# Patient Record
Sex: Female | Born: 1950 | Race: Black or African American | Hispanic: No | Marital: Single | Smoking: Former smoker
Health system: Southern US, Community
[De-identification: ages and names within clinical notes are randomized; demographics above are authoritative.]

## PROBLEM LIST (undated history)

## (undated) DIAGNOSIS — J189 Pneumonia, unspecified organism: Secondary | ICD-10-CM

## (undated) DIAGNOSIS — G8220 Paraplegia, unspecified: Secondary | ICD-10-CM

## (undated) DIAGNOSIS — F32A Depression, unspecified: Secondary | ICD-10-CM

## (undated) DIAGNOSIS — I2510 Atherosclerotic heart disease of native coronary artery without angina pectoris: Secondary | ICD-10-CM

## (undated) DIAGNOSIS — C50919 Malignant neoplasm of unspecified site of unspecified female breast: Secondary | ICD-10-CM

## (undated) DIAGNOSIS — C220 Liver cell carcinoma: Secondary | ICD-10-CM

## (undated) DIAGNOSIS — J9690 Respiratory failure, unspecified, unspecified whether with hypoxia or hypercapnia: Secondary | ICD-10-CM

## (undated) DIAGNOSIS — Z860100 Personal history of colon polyps, unspecified: Secondary | ICD-10-CM

## (undated) DIAGNOSIS — I959 Hypotension, unspecified: Secondary | ICD-10-CM

## (undated) DIAGNOSIS — N3281 Overactive bladder: Secondary | ICD-10-CM

## (undated) DIAGNOSIS — N319 Neuromuscular dysfunction of bladder, unspecified: Secondary | ICD-10-CM

## (undated) DIAGNOSIS — M069 Rheumatoid arthritis, unspecified: Secondary | ICD-10-CM

## (undated) DIAGNOSIS — I10 Essential (primary) hypertension: Secondary | ICD-10-CM

## (undated) DIAGNOSIS — R160 Hepatomegaly, not elsewhere classified: Secondary | ICD-10-CM

## (undated) DIAGNOSIS — F329 Major depressive disorder, single episode, unspecified: Secondary | ICD-10-CM

## (undated) DIAGNOSIS — D649 Anemia, unspecified: Secondary | ICD-10-CM

## (undated) DIAGNOSIS — R110 Nausea: Secondary | ICD-10-CM

## (undated) DIAGNOSIS — K3184 Gastroparesis: Secondary | ICD-10-CM

## (undated) DIAGNOSIS — N189 Chronic kidney disease, unspecified: Secondary | ICD-10-CM

## (undated) DIAGNOSIS — M623 Immobility syndrome (paraplegic): Secondary | ICD-10-CM

## (undated) DIAGNOSIS — I5021 Acute systolic (congestive) heart failure: Secondary | ICD-10-CM

## (undated) DIAGNOSIS — D509 Iron deficiency anemia, unspecified: Secondary | ICD-10-CM

## (undated) DIAGNOSIS — K219 Gastro-esophageal reflux disease without esophagitis: Secondary | ICD-10-CM

## (undated) DIAGNOSIS — Z8719 Personal history of other diseases of the digestive system: Secondary | ICD-10-CM

## (undated) DIAGNOSIS — J90 Pleural effusion, not elsewhere classified: Secondary | ICD-10-CM

## (undated) DIAGNOSIS — Z978 Presence of other specified devices: Secondary | ICD-10-CM

## (undated) DIAGNOSIS — K5900 Constipation, unspecified: Secondary | ICD-10-CM

## (undated) DIAGNOSIS — K449 Diaphragmatic hernia without obstruction or gangrene: Secondary | ICD-10-CM

## (undated) DIAGNOSIS — R609 Edema, unspecified: Secondary | ICD-10-CM

## (undated) DIAGNOSIS — D259 Leiomyoma of uterus, unspecified: Secondary | ICD-10-CM

## (undated) HISTORY — DX: Acute systolic (congestive) heart failure: I50.21

## (undated) HISTORY — DX: Gastro-esophageal reflux disease without esophagitis: K21.9

## (undated) HISTORY — DX: Personal history of colon polyps, unspecified: Z86.0100

## (undated) HISTORY — DX: Leiomyoma of uterus, unspecified: D25.9

## (undated) HISTORY — DX: Pneumonia, unspecified organism: J18.9

## (undated) HISTORY — DX: Edema, unspecified: R60.9

## (undated) HISTORY — DX: Paraplegia, unspecified: G82.20

## (undated) HISTORY — PX: BACK SURGERY: SHX140

## (undated) HISTORY — DX: Malignant neoplasm of unspecified site of unspecified female breast: C50.919

## (undated) HISTORY — DX: Depression, unspecified: F32.A

## (undated) HISTORY — DX: Hypotension, unspecified: I95.9

## (undated) HISTORY — DX: Rheumatoid arthritis, unspecified: M06.9

## (undated) HISTORY — DX: Diaphragmatic hernia without obstruction or gangrene: K44.9

## (undated) HISTORY — DX: Respiratory failure, unspecified, unspecified whether with hypoxia or hypercapnia: J96.90

## (undated) HISTORY — DX: Immobility syndrome (paraplegic): M62.3

## (undated) HISTORY — DX: Anemia, unspecified: D64.9

## (undated) HISTORY — DX: Essential (primary) hypertension: I10

## (undated) HISTORY — DX: Major depressive disorder, single episode, unspecified: F32.9

## (undated) HISTORY — DX: Chronic kidney disease, unspecified: N18.9

## (undated) HISTORY — DX: Personal history of colonic polyps: Z86.010

## (undated) HISTORY — DX: Iron deficiency anemia, unspecified: D50.9

## (undated) HISTORY — DX: Pleural effusion, not elsewhere classified: J90

## (undated) HISTORY — DX: Gastroparesis: K31.84

---

## 1975-07-28 DIAGNOSIS — M623 Immobility syndrome (paraplegic): Secondary | ICD-10-CM

## 1975-07-28 HISTORY — DX: Immobility syndrome (paraplegic): M62.3

## 2001-07-27 HISTORY — PX: BREAST SURGERY: SHX581

## 2007-07-28 HISTORY — PX: PRESSURE ULCER DEBRIDEMENT: SHX750

## 2008-09-01 ENCOUNTER — Inpatient Hospital Stay (HOSPITAL_COMMUNITY)
Admission: EM | Admit: 2008-09-01 | Discharge: 2008-09-04 | Payer: Self-pay | Source: Home / Self Care | Admitting: Emergency Medicine

## 2008-09-02 HISTORY — PX: WOUND DEBRIDEMENT: SHX247

## 2008-10-12 DIAGNOSIS — D649 Anemia, unspecified: Secondary | ICD-10-CM

## 2008-10-12 DIAGNOSIS — D509 Iron deficiency anemia, unspecified: Secondary | ICD-10-CM

## 2008-10-12 DIAGNOSIS — I10 Essential (primary) hypertension: Secondary | ICD-10-CM

## 2008-10-12 HISTORY — DX: Anemia, unspecified: D64.9

## 2008-10-12 HISTORY — DX: Iron deficiency anemia, unspecified: D50.9

## 2008-10-12 HISTORY — DX: Essential (primary) hypertension: I10

## 2009-01-17 DIAGNOSIS — M069 Rheumatoid arthritis, unspecified: Secondary | ICD-10-CM

## 2009-01-17 HISTORY — DX: Rheumatoid arthritis, unspecified: M06.9

## 2009-02-02 DIAGNOSIS — K219 Gastro-esophageal reflux disease without esophagitis: Secondary | ICD-10-CM

## 2009-02-02 HISTORY — DX: Gastro-esophageal reflux disease without esophagitis: K21.9

## 2009-02-09 ENCOUNTER — Emergency Department (HOSPITAL_COMMUNITY): Admission: EM | Admit: 2009-02-09 | Discharge: 2009-02-09 | Payer: Self-pay | Admitting: Emergency Medicine

## 2009-03-07 ENCOUNTER — Ambulatory Visit: Payer: Self-pay | Admitting: Gastroenterology

## 2009-04-25 ENCOUNTER — Ambulatory Visit: Payer: Self-pay | Admitting: Gastroenterology

## 2009-05-20 DIAGNOSIS — R609 Edema, unspecified: Secondary | ICD-10-CM

## 2009-05-20 DIAGNOSIS — I959 Hypotension, unspecified: Secondary | ICD-10-CM

## 2009-05-20 HISTORY — DX: Hypotension, unspecified: I95.9

## 2009-05-20 HISTORY — DX: Edema, unspecified: R60.9

## 2009-08-24 ENCOUNTER — Ambulatory Visit: Payer: Self-pay

## 2010-07-26 ENCOUNTER — Emergency Department (HOSPITAL_COMMUNITY)
Admission: EM | Admit: 2010-07-26 | Discharge: 2010-07-26 | Payer: Self-pay | Source: Home / Self Care | Admitting: Emergency Medicine

## 2010-08-25 ENCOUNTER — Inpatient Hospital Stay (HOSPITAL_COMMUNITY)
Admission: EM | Admit: 2010-08-25 | Discharge: 2010-08-27 | DRG: 377 | Disposition: A | Discharge status: Skilled Nursing Facility | Payer: PRIVATE HEALTH INSURANCE | Attending: Internal Medicine | Admitting: Internal Medicine

## 2010-08-25 DIAGNOSIS — D539 Nutritional anemia, unspecified: Secondary | ICD-10-CM | POA: Diagnosis present

## 2010-08-25 DIAGNOSIS — R0789 Other chest pain: Secondary | ICD-10-CM | POA: Diagnosis present

## 2010-08-25 DIAGNOSIS — K208 Other esophagitis without bleeding: Secondary | ICD-10-CM | POA: Diagnosis present

## 2010-08-25 DIAGNOSIS — G8220 Paraplegia, unspecified: Secondary | ICD-10-CM | POA: Diagnosis present

## 2010-08-25 DIAGNOSIS — L89109 Pressure ulcer of unspecified part of back, unspecified stage: Secondary | ICD-10-CM | POA: Diagnosis present

## 2010-08-25 DIAGNOSIS — I10 Essential (primary) hypertension: Secondary | ICD-10-CM | POA: Diagnosis present

## 2010-08-25 DIAGNOSIS — Z853 Personal history of malignant neoplasm of breast: Secondary | ICD-10-CM

## 2010-08-25 DIAGNOSIS — R7402 Elevation of levels of lactic acid dehydrogenase (LDH): Secondary | ICD-10-CM | POA: Diagnosis present

## 2010-08-25 DIAGNOSIS — R7401 Elevation of levels of liver transaminase levels: Secondary | ICD-10-CM | POA: Diagnosis present

## 2010-08-25 DIAGNOSIS — F329 Major depressive disorder, single episode, unspecified: Secondary | ICD-10-CM | POA: Diagnosis present

## 2010-08-25 DIAGNOSIS — L8994 Pressure ulcer of unspecified site, stage 4: Secondary | ICD-10-CM | POA: Diagnosis present

## 2010-08-25 DIAGNOSIS — K920 Hematemesis: Principal | ICD-10-CM | POA: Diagnosis present

## 2010-08-25 DIAGNOSIS — Z9010 Acquired absence of unspecified breast and nipple: Secondary | ICD-10-CM

## 2010-08-25 DIAGNOSIS — F3289 Other specified depressive episodes: Secondary | ICD-10-CM | POA: Diagnosis present

## 2010-08-25 DIAGNOSIS — IMO0002 Reserved for concepts with insufficient information to code with codable children: Secondary | ICD-10-CM

## 2010-08-25 LAB — URINALYSIS, ROUTINE W REFLEX MICROSCOPIC
Hgb urine dipstick: NEGATIVE
Protein, ur: NEGATIVE mg/dL
Urobilinogen, UA: 1 mg/dL (ref 0.0–1.0)

## 2010-08-25 LAB — COMPREHENSIVE METABOLIC PANEL
AST: 27 U/L (ref 0–37)
Albumin: 3.2 g/dL — ABNORMAL LOW (ref 3.5–5.2)
Albumin: 2.9 g/dL — ABNORMAL LOW (ref 3.5–5.2)
Alkaline Phosphatase: 75 U/L (ref 39–117)
BUN: 10 mg/dL (ref 6–23)
Calcium: 9.4 mg/dL (ref 8.4–10.5)
Chloride: 105 mEq/L (ref 96–112)
Chloride: 109 mEq/L (ref 96–112)
Creatinine, Ser: 0.66 mg/dL (ref 0.4–1.2)
Creatinine, Ser: 0.6 mg/dL (ref 0.4–1.2)
GFR calc Af Amer: >60 mL/min (ref >60)
Glucose, Bld: 110 mg/dL — ABNORMAL HIGH (ref 70–99)
Potassium: 3.7 mEq/L (ref 3.5–5.1)
Total Bilirubin: 0.4 mg/dL (ref 0.3–1.2)
Total Protein: 8.6 g/dL — ABNORMAL HIGH (ref 6.0–8.3)

## 2010-08-25 LAB — DIFFERENTIAL
Lymphocytes Relative: 17 % (ref 12–46)
Lymphs Abs: 1.8 10*3/uL (ref 0.7–4.0)
Monocytes Absolute: 0.6 10*3/uL (ref 0.1–1.0)
Monocytes Relative: 6 % (ref 3–12)
Neutro Abs: 7.7 10*3/uL (ref 1.7–7.7)

## 2010-08-25 LAB — CBC
HCT: 41.2 % (ref 36.0–46.0)
Hemoglobin: 13.6 g/dL (ref 12.0–15.0)
MCH: 24.6 pg — ABNORMAL LOW (ref 26.0–34.0)
MCH: 24.2 pg — ABNORMAL LOW (ref 26.0–34.0)
MCHC: 33 g/dL (ref 30.0–36.0)
Platelets: 239 10*3/uL (ref 150–400)
RBC: 5.53 MIL/uL — ABNORMAL HIGH (ref 3.87–5.11)
RBC: 4.87 MIL/uL (ref 3.87–5.11)
RDW: 15.7 % — ABNORMAL HIGH (ref 11.5–15.5)
WBC: 8.4 10*3/uL (ref 4.0–10.5)

## 2010-08-25 LAB — CK TOTAL AND CKMB (NOT AT ARMC)
CK, MB: 1.3 ng/mL (ref 0.3–4.0)
Relative Index: INVALID (ref 0.0–2.5)
Total CK: 47 U/L (ref 7–177)

## 2010-08-25 LAB — URINE MICROSCOPIC-ADD ON

## 2010-08-25 LAB — CARDIAC PANEL(CRET KIN+CKTOT+MB+TROPI)
CK, MB: 0.9 ng/mL (ref 0.3–4.0)
Relative Index: INVALID (ref 0.0–2.5)
Total CK: 41 U/L (ref 7–177)
Troponin I: 0.01 ng/mL (ref 0.00–0.06)
Troponin I: 0.01 ng/mL (ref 0.00–0.06)

## 2010-08-25 LAB — TYPE AND SCREEN: Antibody Screen: NEGATIVE

## 2010-08-25 LAB — MRSA PCR SCREENING: MRSA by PCR: NEGATIVE

## 2010-08-26 LAB — COMPREHENSIVE METABOLIC PANEL
Albumin: 2.8 g/dL — ABNORMAL LOW (ref 3.5–5.2)
Alkaline Phosphatase: 65 U/L (ref 39–117)
BUN: 9 mg/dL (ref 6–23)
Calcium: 8.6 mg/dL (ref 8.4–10.5)
Creatinine, Ser: 0.58 mg/dL (ref 0.4–1.2)
Glucose, Bld: 90 mg/dL (ref 70–99)
Potassium: 3.5 mEq/L (ref 3.5–5.1)
Total Protein: 7.1 g/dL (ref 6.0–8.3)

## 2010-08-26 LAB — CBC
HCT: 34 % — ABNORMAL LOW (ref 36.0–46.0)
MCH: 23.9 pg — ABNORMAL LOW (ref 26.0–34.0)
MCHC: 31.8 g/dL (ref 30.0–36.0)
MCV: 75.4 fL — ABNORMAL LOW (ref 78.0–100.0)
Platelets: 219 10*3/uL (ref 150–400)
RDW: 15.9 % — ABNORMAL HIGH (ref 11.5–15.5)
WBC: 5.8 10*3/uL (ref 4.0–10.5)

## 2010-08-27 LAB — CBC
Hemoglobin: 10.3 g/dL — ABNORMAL LOW (ref 12.0–15.0)
MCH: 23.7 pg — ABNORMAL LOW (ref 26.0–34.0)
MCV: 75.8 fL — ABNORMAL LOW (ref 78.0–100.0)
Platelets: 209 10*3/uL (ref 150–400)
RBC: 4.34 MIL/uL (ref 3.87–5.11)
WBC: 6.3 10*3/uL (ref 4.0–10.5)

## 2010-08-29 DIAGNOSIS — K449 Diaphragmatic hernia without obstruction or gangrene: Secondary | ICD-10-CM

## 2010-08-29 HISTORY — DX: Diaphragmatic hernia without obstruction or gangrene: K44.9
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
## 2010-09-30 ENCOUNTER — Emergency Department (HOSPITAL_COMMUNITY)
Admission: EM | Admit: 2010-09-30 | Discharge: 2010-09-30 | Disposition: A | Discharge status: Home / Self Care | Payer: PRIVATE HEALTH INSURANCE | Attending: Emergency Medicine | Admitting: Emergency Medicine

## 2010-09-30 DIAGNOSIS — R112 Nausea with vomiting, unspecified: Secondary | ICD-10-CM | POA: Insufficient documentation

## 2010-09-30 DIAGNOSIS — I129 Hypertensive chronic kidney disease with stage 1 through stage 4 chronic kidney disease, or unspecified chronic kidney disease: Secondary | ICD-10-CM | POA: Insufficient documentation

## 2010-09-30 DIAGNOSIS — I2510 Atherosclerotic heart disease of native coronary artery without angina pectoris: Secondary | ICD-10-CM | POA: Insufficient documentation

## 2010-09-30 DIAGNOSIS — R630 Anorexia: Secondary | ICD-10-CM | POA: Insufficient documentation

## 2010-09-30 DIAGNOSIS — R509 Fever, unspecified: Secondary | ICD-10-CM | POA: Insufficient documentation

## 2010-09-30 DIAGNOSIS — Z853 Personal history of malignant neoplasm of breast: Secondary | ICD-10-CM | POA: Insufficient documentation

## 2010-09-30 DIAGNOSIS — N189 Chronic kidney disease, unspecified: Secondary | ICD-10-CM | POA: Insufficient documentation

## 2010-09-30 DIAGNOSIS — N390 Urinary tract infection, site not specified: Secondary | ICD-10-CM | POA: Insufficient documentation

## 2010-09-30 DIAGNOSIS — K219 Gastro-esophageal reflux disease without esophagitis: Secondary | ICD-10-CM | POA: Insufficient documentation

## 2010-09-30 DIAGNOSIS — Z79899 Other long term (current) drug therapy: Secondary | ICD-10-CM | POA: Insufficient documentation

## 2010-09-30 DIAGNOSIS — F3289 Other specified depressive episodes: Secondary | ICD-10-CM | POA: Insufficient documentation

## 2010-09-30 DIAGNOSIS — I959 Hypotension, unspecified: Secondary | ICD-10-CM | POA: Insufficient documentation

## 2010-09-30 DIAGNOSIS — F329 Major depressive disorder, single episode, unspecified: Secondary | ICD-10-CM | POA: Insufficient documentation

## 2010-09-30 DIAGNOSIS — G8220 Paraplegia, unspecified: Secondary | ICD-10-CM | POA: Insufficient documentation

## 2010-09-30 DIAGNOSIS — R109 Unspecified abdominal pain: Secondary | ICD-10-CM | POA: Insufficient documentation

## 2010-09-30 LAB — URINALYSIS, ROUTINE W REFLEX MICROSCOPIC
Glucose, UA: NEGATIVE mg/dL
Ketones, ur: 15 mg/dL — AB
Nitrite: POSITIVE — AB
Protein, ur: 100 mg/dL — AB
Specific Gravity, Urine: 1.017 (ref 1.005–1.030)
Urobilinogen, UA: 1 mg/dL (ref 0.0–1.0)
pH: 5.5 (ref 5.0–8.0)

## 2010-09-30 LAB — OCCULT BLOOD, POC DEVICE: Fecal Occult Bld: NEGATIVE

## 2010-09-30 LAB — COMPREHENSIVE METABOLIC PANEL
Albumin: 2.9 g/dL — ABNORMAL LOW (ref 3.5–5.2)
Alkaline Phosphatase: 79 U/L (ref 39–117)
BUN: 21 mg/dL (ref 6–23)
CO2: 19 mEq/L (ref 19–32)
Chloride: 109 mEq/L (ref 96–112)
GFR calc non Af Amer: 51 mL/min — ABNORMAL LOW (ref >60)
Glucose, Bld: 89 mg/dL (ref 70–99)
Potassium: 3.3 mEq/L — ABNORMAL LOW (ref 3.5–5.1)
Total Bilirubin: 0.8 mg/dL (ref 0.3–1.2)

## 2010-09-30 LAB — URINE MICROSCOPIC-ADD ON

## 2010-09-30 LAB — CBC
HCT: 37.6 % (ref 36.0–46.0)
Hemoglobin: 12.3 g/dL (ref 12.0–15.0)
MCH: 23.5 pg — ABNORMAL LOW (ref 26.0–34.0)
MCHC: 32.7 g/dL (ref 30.0–36.0)
MCV: 71.8 fL — ABNORMAL LOW (ref 78.0–100.0)
Platelets: 299 K/uL (ref 150–400)
RBC: 5.24 MIL/uL — ABNORMAL HIGH (ref 3.87–5.11)
RDW: 16 % — ABNORMAL HIGH (ref 11.5–15.5)
WBC: 26.5 10*3/uL — ABNORMAL HIGH (ref 4.0–10.5)

## 2010-09-30 LAB — DIFFERENTIAL
Basophils Absolute: 0 K/uL (ref 0.0–0.1)
Basophils Relative: 0 % (ref 0–1)
Eosinophils Absolute: 0 K/uL (ref 0.0–0.7)
Eosinophils Relative: 0 % (ref 0–5)
Lymphocytes Relative: 6 % — ABNORMAL LOW (ref 12–46)
Lymphs Abs: 1.6 K/uL (ref 0.7–4.0)
Monocytes Absolute: 1.1 K/uL — ABNORMAL HIGH (ref 0.1–1.0)
Monocytes Relative: 4 % (ref 3–12)
Neutro Abs: 23.8 10*3/uL — ABNORMAL HIGH (ref 1.7–7.7)
Neutrophils Relative %: 90 % — ABNORMAL HIGH (ref 43–77)

## 2010-09-30 LAB — COMPREHENSIVE METABOLIC PANEL WITH GFR
ALT: 29 U/L (ref 0–35)
AST: 27 U/L (ref 0–37)
Calcium: 8.7 mg/dL (ref 8.4–10.5)
Creatinine, Ser: 1.1 mg/dL (ref 0.4–1.2)
GFR calc Af Amer: >60 mL/min (ref >60)
Sodium: 139 meq/L (ref 135–145)
Total Protein: 7.8 g/dL (ref 6.0–8.3)

## 2010-10-05 DIAGNOSIS — C50919 Malignant neoplasm of unspecified site of unspecified female breast: Secondary | ICD-10-CM

## 2010-10-05 DIAGNOSIS — K3184 Gastroparesis: Secondary | ICD-10-CM

## 2010-10-05 HISTORY — DX: Gastroparesis: K31.84

## 2010-10-05 HISTORY — DX: Malignant neoplasm of unspecified site of unspecified female breast: C50.919

## 2010-10-06 LAB — CBC
Hemoglobin: 12.7 g/dL (ref 12.0–15.0)
MCH: 24.7 pg — ABNORMAL LOW (ref 26.0–34.0)
MCV: 77.1 fL — ABNORMAL LOW (ref 78.0–100.0)
Platelets: 237 10*3/uL (ref 150–400)
RBC: 5.15 MIL/uL — ABNORMAL HIGH (ref 3.87–5.11)

## 2010-10-06 LAB — URINE CULTURE: Culture  Setup Time: 201112311108

## 2010-10-06 LAB — BASIC METABOLIC PANEL
CO2: 22 mEq/L (ref 19–32)
Chloride: 106 mEq/L (ref 96–112)
Creatinine, Ser: 0.69 mg/dL (ref 0.4–1.2)
GFR calc Af Amer: >60 mL/min (ref >60)

## 2010-10-06 LAB — URINE MICROSCOPIC-ADD ON

## 2010-10-06 LAB — URINALYSIS, ROUTINE W REFLEX MICROSCOPIC
Glucose, UA: NEGATIVE mg/dL
Ketones, ur: NEGATIVE mg/dL
Protein, ur: 30 mg/dL — AB

## 2010-10-06 LAB — DIFFERENTIAL
Eosinophils Absolute: 0 10*3/uL (ref 0.0–0.7)
Eosinophils Relative: 0 % (ref 0–5)
Lymphs Abs: 0.8 10*3/uL (ref 0.7–4.0)
Monocytes Relative: 2 % — ABNORMAL LOW (ref 3–12)
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
## 2010-11-11 LAB — URINE CULTURE: Colony Count: 85000

## 2010-11-11 LAB — POCT I-STAT, CHEM 8
BUN: 22 mg/dL (ref 6–23)
Calcium, Ion: 1.07 mmol/L — ABNORMAL LOW (ref 1.12–1.32)
Glucose, Bld: 124 mg/dL — ABNORMAL HIGH (ref 70–99)
TCO2: 26 mmol/L (ref 0–100)

## 2010-11-11 LAB — URINALYSIS, ROUTINE W REFLEX MICROSCOPIC
Bilirubin Urine: NEGATIVE
Ketones, ur: NEGATIVE mg/dL
Nitrite: POSITIVE — AB
Protein, ur: NEGATIVE mg/dL
Specific Gravity, Urine: 1.017 (ref 1.005–1.030)
Urobilinogen, UA: 1 mg/dL (ref 0.0–1.0)

## 2010-11-11 LAB — CBC
HCT: 40.1 % (ref 36.0–46.0)
Hemoglobin: 13.5 g/dL (ref 12.0–15.0)
MCHC: 33.7 g/dL (ref 30.0–36.0)
RDW: 16.6 % — ABNORMAL HIGH (ref 11.5–15.5)

## 2010-11-11 LAB — CULTURE, BLOOD (ROUTINE X 2): Culture: NO GROWTH

## 2010-11-11 LAB — CK TOTAL AND CKMB (NOT AT ARMC)
CK, MB: 0.8 ng/mL (ref 0.3–4.0)
Relative Index: INVALID (ref 0.0–2.5)

## 2010-11-11 LAB — DIFFERENTIAL
Basophils Absolute: 0 10*3/uL (ref 0.0–0.1)
Eosinophils Relative: 0 % (ref 0–5)
Lymphocytes Relative: 4 % — ABNORMAL LOW (ref 12–46)
Monocytes Absolute: 0.7 10*3/uL (ref 0.1–1.0)
Monocytes Relative: 5 % (ref 3–12)

## 2010-11-11 LAB — POCT CARDIAC MARKERS

## 2010-11-11 LAB — LEGIONELLA ANTIGEN, URINE

## 2010-11-11 LAB — BRAIN NATRIURETIC PEPTIDE: Pro B Natriuretic peptide (BNP): 82 pg/mL (ref 0.0–100.0)
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
## 2010-12-09 NOTE — Consult Note (Signed)
NAMETANA, TREFRY NO.:  [id number]   MEDICAL RECORD NO.:  [id number]          PATIENT TYPE:  INP   LOCATION:  2603                         FACILITY:  [facility]   PHYSICIAN:  Odis Hollingshead, M.D.DATE OF BIRTH:  Oct 13, 1950   DATE OF CONSULTATION:  DATE OF DISCHARGE:                                 CONSULTATION   REQUESTING PHYSICIAN:  Alcide Evener, MD   REASON FOR CONSULTATION:  Large sacral back open wound.   HISTORY:  This is a 60 year old female, T7 paraplegic and number of  other medical problems.  She started developing a sacral decubitus  ulcer, became infected and undermined up to her back and required  debridement.  She has been taken care with [facility].  She  now has a VAC on her back.  She presented to the emergency department  yesterday with subjective fevers, cough, and malaise and there was  concern for pneumonia.  However, during the workup for the fever and  possible source, the [facility] was removed.  Dr. Tommy Medal called me last night  wondering how to replace the [facility].  I had advised him just to go ahead  and put a wet-to-dry dressing change on and we could find a nurse to put  the [facility] on.  Last night, the nurse did come around and put the [facility] on  and currently is on.   PHYSICAL EXAMINATION:  On examination, she is a well-developed, well-  nourished female and she is in no acute distress.  Her back demonstrates  a VAC that was basically from the midscapular area all the way down to  the sacrum.  There was a good seal, good suction and is set at 125 mm.   IMPRESSION:  Large open wound of the back and sacral area.  VAC is now  on.   RECOMMENDATIONS:  I will have the wound care nurses see her tomorrow and  start VAC changes every other day.  They can also check on her heel  wound as well.      Odis Hollingshead, M.D.  Electronically Signed     TJR/MEDQ  D:  09/02/2008  T:  09/02/2008  Job:  [id number]   cc:   Alcide Evener, MD

## 2011-08-24 ENCOUNTER — Other Ambulatory Visit (HOSPITAL_BASED_OUTPATIENT_CLINIC_OR_DEPARTMENT_OTHER): Payer: Self-pay | Admitting: Internal Medicine

## 2011-08-24 DIAGNOSIS — N6320 Unspecified lump in the left breast, unspecified quadrant: Secondary | ICD-10-CM

## 2011-09-01 ENCOUNTER — Ambulatory Visit
Admission: RE | Admit: 2011-09-01 | Discharge: 2011-09-01 | Disposition: A | Discharge status: Home / Self Care | Payer: PRIVATE HEALTH INSURANCE | Source: Ambulatory Visit | Attending: Internal Medicine | Admitting: Internal Medicine

## 2011-09-01 ENCOUNTER — Other Ambulatory Visit (HOSPITAL_BASED_OUTPATIENT_CLINIC_OR_DEPARTMENT_OTHER): Payer: Self-pay | Admitting: Internal Medicine

## 2011-09-01 DIAGNOSIS — N6320 Unspecified lump in the left breast, unspecified quadrant: Secondary | ICD-10-CM

## 2011-09-01 LAB — HM MAMMOGRAPHY

## 2011-09-04 ENCOUNTER — Encounter (INDEPENDENT_AMBULATORY_CARE_PROVIDER_SITE_OTHER): Payer: Self-pay | Admitting: Surgery

## 2011-09-08 ENCOUNTER — Ambulatory Visit (INDEPENDENT_AMBULATORY_CARE_PROVIDER_SITE_OTHER): Payer: PRIVATE HEALTH INSURANCE | Admitting: Surgery

## 2011-09-08 ENCOUNTER — Encounter (INDEPENDENT_AMBULATORY_CARE_PROVIDER_SITE_OTHER): Payer: Self-pay | Admitting: Surgery

## 2011-09-08 VITALS — BP 122/84 | HR 64 | Temp 97.0°F | Resp 24 | Ht 65.0 in | Wt 160.0 lb

## 2011-09-08 DIAGNOSIS — C50912 Malignant neoplasm of unspecified site of left female breast: Secondary | ICD-10-CM

## 2011-09-08 DIAGNOSIS — C50919 Malignant neoplasm of unspecified site of unspecified female breast: Secondary | ICD-10-CM | POA: Insufficient documentation

## 2011-09-08 NOTE — Patient Instructions (Signed)
Call me at [phone] to let know your decision on surgery.
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
## 2011-09-15 ENCOUNTER — Telehealth: Payer: Self-pay | Admitting: *Deleted

## 2011-09-15 NOTE — Telephone Encounter (Signed)
Confirmed 09/25/11 appt w/ pt sister Pamala Hurry.  Unable to mail before appt letter & packet - gave verbal.

## 2011-09-24 ENCOUNTER — Other Ambulatory Visit: Payer: Self-pay | Admitting: *Deleted

## 2011-09-24 DIAGNOSIS — C50219 Malignant neoplasm of upper-inner quadrant of unspecified female breast: Secondary | ICD-10-CM

## 2011-09-25 ENCOUNTER — Other Ambulatory Visit: Payer: PRIVATE HEALTH INSURANCE | Admitting: Lab

## 2011-09-25 ENCOUNTER — Ambulatory Visit: Payer: PRIVATE HEALTH INSURANCE | Admitting: Oncology

## 2011-09-25 ENCOUNTER — Ambulatory Visit: Payer: PRIVATE HEALTH INSURANCE

## 2011-09-28 ENCOUNTER — Inpatient Hospital Stay (HOSPITAL_COMMUNITY): Admission: RE | Admit: 2011-09-28 | Payer: PRIVATE HEALTH INSURANCE | Source: Ambulatory Visit

## 2011-09-30 ENCOUNTER — Encounter (HOSPITAL_COMMUNITY): Admission: RE | Payer: Self-pay | Source: Ambulatory Visit

## 2011-09-30 ENCOUNTER — Ambulatory Visit (HOSPITAL_COMMUNITY): Admission: RE | Admit: 2011-09-30 | Payer: PRIVATE HEALTH INSURANCE | Source: Ambulatory Visit | Admitting: Surgery

## 2011-09-30 SURGERY — SIMPLE MASTECTOMY WITH AXILLARY SENTINEL NODE BIOPSY
Anesthesia: General | Laterality: Left

## 2011-10-01 DIAGNOSIS — K219 Gastro-esophageal reflux disease without esophagitis: Secondary | ICD-10-CM | POA: Insufficient documentation

## 2011-10-01 DIAGNOSIS — M359 Systemic involvement of connective tissue, unspecified: Secondary | ICD-10-CM | POA: Insufficient documentation

## 2011-10-01 DIAGNOSIS — G8220 Paraplegia, unspecified: Secondary | ICD-10-CM | POA: Insufficient documentation

## 2011-10-01 DIAGNOSIS — I10 Essential (primary) hypertension: Secondary | ICD-10-CM | POA: Insufficient documentation

## 2011-10-05 ENCOUNTER — Other Ambulatory Visit (INDEPENDENT_AMBULATORY_CARE_PROVIDER_SITE_OTHER): Payer: Self-pay | Admitting: Surgery

## 2011-11-30 ENCOUNTER — Encounter (INDEPENDENT_AMBULATORY_CARE_PROVIDER_SITE_OTHER): Payer: Self-pay

## 2013-01-06 ENCOUNTER — Other Ambulatory Visit: Payer: Self-pay | Admitting: *Deleted

## 2013-01-06 MED ORDER — HYDROCODONE-ACETAMINOPHEN 7.5-325 MG PO TABS: 1.0000 | ORAL_TABLET | ORAL | Status: DC | PRN

## 2013-02-23 ENCOUNTER — Encounter: Payer: Self-pay | Admitting: Internal Medicine

## 2013-02-23 ENCOUNTER — Non-Acute Institutional Stay (SKILLED_NURSING_FACILITY): Payer: PRIVATE HEALTH INSURANCE | Admitting: Internal Medicine

## 2013-02-23 DIAGNOSIS — J309 Allergic rhinitis, unspecified: Secondary | ICD-10-CM | POA: Insufficient documentation

## 2013-02-23 DIAGNOSIS — F32A Depression, unspecified: Secondary | ICD-10-CM

## 2013-02-23 DIAGNOSIS — G8220 Paraplegia, unspecified: Secondary | ICD-10-CM

## 2013-02-23 DIAGNOSIS — F329 Major depressive disorder, single episode, unspecified: Secondary | ICD-10-CM

## 2013-02-23 DIAGNOSIS — K5900 Constipation, unspecified: Secondary | ICD-10-CM

## 2013-02-23 DIAGNOSIS — M069 Rheumatoid arthritis, unspecified: Secondary | ICD-10-CM

## 2013-02-23 DIAGNOSIS — K219 Gastro-esophageal reflux disease without esophagitis: Secondary | ICD-10-CM

## 2013-02-23 DIAGNOSIS — I10 Essential (primary) hypertension: Secondary | ICD-10-CM

## 2013-02-23 DIAGNOSIS — F3289 Other specified depressive episodes: Secondary | ICD-10-CM

## 2013-02-23 DIAGNOSIS — N319 Neuromuscular dysfunction of bladder, unspecified: Secondary | ICD-10-CM

## 2013-02-23 NOTE — Progress Notes (Signed)
Patient ID: [name], female   DOB: 03-Dec-1950, 62 y.o.   MRN: [id number]   Code Status: full code  No Known Allergies  Chief Complaint  Patient presents with  . Medical Managment of Chronic Issues  . Rash   [facility] [place]  HPI:  62 y/o female patient seen today for routine visit. She has noticed a rash/ bump on her right chest for few days which has now opened and she wants this addressed. Denies any other complaints. She is OOB and moves around in Mining engineer wheelchair. No comlaints/ concerns from staff. Reviewed her medications and labs  Review of Systems  Constitutional: Negative for fever, chills and diaphoresis.  HENT: Negative for nosebleeds, congestion and sore throat.   Eyes: Negative for blurred vision.  Respiratory: Negative for cough, shortness of breath and wheezing.   Cardiovascular: Negative for chest pain and palpitations.  Gastrointestinal: Negative for heartburn, nausea, vomiting, abdominal pain, diarrhea and constipation.  Genitourinary: Negative for dysuria and frequency.  Musculoskeletal: Negative for myalgias and falls.  Skin: Negative for itching.  Neurological: Negative for dizziness, seizures and headaches.  Psychiatric/Behavioral: Negative for depression and memory loss. The patient does not have insomnia.      Past Medical History  Diagnosis Date  . Immobility syndrome (paraplegic)   . GERD (gastroesophageal reflux disease)   . Leiomyoma of uterus   . Anemia   . Hypertension   . Chronic kidney disease   . Cancer     rt breast  . CHF (congestive heart failure)    Past Surgical History  Procedure Laterality Date  . Breast surgery  2003    Right mastectomy  . Wound debridement  09/02/2008    Large sacral back open wound  . Pressure ulcer debridement  2009    on back   Social History:   reports that she quit smoking about 17 years ago. She does not have any smokeless tobacco history on file. She reports that she does not drink  alcohol or use illicit drugs.  Family History  Problem Relation Age of Onset  . Stroke Mother   . Hypertension Mother   . Cancer Maternal Aunt     unsure of what kind  . Hypertension Maternal Aunt   . Hypertension Maternal Uncle     Medications: Patient's Medications  New Prescriptions   No medications on file  Previous Medications   BACLOFEN (LIORESAL) 10 MG TABLET    Take 10 mg by mouth 3 (three) times daily.   BISACODYL (FLEET) 10 MG/30ML ENEM    Place 10 mg rectally once.   CALCIUM-VITAMIN D (OYSTER CALCIUM 500 + D) 500-200 MG-UNIT PER TABLET    Take 1 tablet by mouth daily.   CARVEDILOL (COREG) 6.25 MG TABLET    Take 6.25 mg by mouth 2 (two) times daily with a meal.   CETIRIZINE (ZYRTEC) 5 MG TABLET    Take 5 mg by mouth daily.   DIPHENHYDRAMINE (BENADRYL) 25 MG TABLET    Take 25 mg by mouth every 6 (six) hours as needed.   ESCITALOPRAM (LEXAPRO) 20 MG TABLET    Take 20 mg by mouth daily.   ESOMEPRAZOLE (NEXIUM) 40 MG CAPSULE    Take 40 mg by mouth daily before breakfast.   FUROSEMIDE (LASIX) 20 MG TABLET    Take 20 mg by mouth daily.   HYDROCODONE-ACETAMINOPHEN (NORCO) 7.5-325 MG PER TABLET    Take 1 tablet by mouth every 4 (four) hours as needed.  HYDROXYCHLOROQUINE (PLAQUENIL) 200 MG TABLET    Take by mouth daily.   MENTHOL, TOPICAL ANALGESIC, (BIOFREEZE EX)    Apply topically daily.   METOCLOPRAMIDE (REGLAN) 5 MG TABLET    Take 5 mg by mouth 3 (three) times daily.   MULTIPLE VITAMIN (MULTIVITAMIN) CAPSULE    Take 1 capsule by mouth daily.   OXYBUTYNIN (DITROPAN) 5 MG TABLET    Take 5 mg by mouth 3 (three) times daily.   PROMETHAZINE (PHENERGAN) 25 MG TABLET    Take 25 mg by mouth every 6 (six) hours as needed.   TRAZODONE (DESYREL) 50 MG TABLET    Take 75 mg by mouth at bedtime.    WOUND DRESSINGS (COMBIDERM ACD) PADS    Apply topically.  Modified Medications   No medications on file  Discontinued Medications   No medications on file     Physical Exam: Filed  Vitals:   02/23/13 1353  BP: 132/72  Pulse: 74  Temp: 97.4 F (36.3 C)  Resp: 18  Height: 5\' 5"  (1.651 m)  Weight: 166 lb (75.297 kg)  SpO2: 97%   gen- elderly female in NAD, in bed heent- no pallor, no icterus, no LAD, MMM Chest- has a small 0.5 cm open denuded skin area with erythema, non tender,no drainage on right mid chest area cvs- ns1,s2, rrr respi- CTAB abdo- bs+, soft, non tender Ext- able to move her upper extremities, has foot drop neuro- aaox 3, no focal deficit Psych- normal affect  Labs reviewed- 08/03/12 wbc 5.1, hb 9.1, hct 28.4, plt 297, na 136, k 3.7, glu 100, cr 0.58, lft wnl  Assessment/Plan  Paraplegia- continue baclofen for muscle spasm and norco for pain and monitor her symptoms. Continue to be OOB to chair  Constipation- continue bisacodyl, has bowel movement every 2 days  HTN- continue coreg and lasix, monitor bmp  Depression- her mood remains stable. Continue lexapro and trazodone  gerd- continue nexium for now and monitor clinically, continue metoclopramide   Neurogenic bladder- no desired effect noted with oxybutynin. Stop oxybutynin with tapering of it over 2 weeks.   Rash- Bacitracin ointment bid to the chest  RA- continue her hydroxychloroquine and monitor cbc  Allergic rhinitis- continue zyrtec for now  D/c benadryl and chloraseptic   Labs/tests ordered- cbc, cmp

## 2013-02-27 ENCOUNTER — Encounter: Payer: Self-pay | Admitting: *Deleted

## 2013-03-21 ENCOUNTER — Non-Acute Institutional Stay (SKILLED_NURSING_FACILITY): Payer: PRIVATE HEALTH INSURANCE | Admitting: Internal Medicine

## 2013-03-21 ENCOUNTER — Encounter: Payer: Self-pay | Admitting: Internal Medicine

## 2013-03-21 DIAGNOSIS — R61 Generalized hyperhidrosis: Secondary | ICD-10-CM

## 2013-03-21 DIAGNOSIS — L299 Pruritus, unspecified: Secondary | ICD-10-CM

## 2013-03-21 NOTE — Progress Notes (Signed)
Patient ID: [name], female   DOB: Dec 17, 1950, 62 y.o.   MRN: [id number] Location:  [facility] Provider:  Gwenith Spitz. Renato Gails, D.O., C.M.D.  Code Status:  Full code  Chief Complaint  Patient presents with  . Medical Managment of Chronic Issues    HPI:  62 yo female here for long term care.  She is paraplegic and has a h/o breast cancer s/p bilateral mastectomy, CAD, depression, uterine leiomyoma, HTN, CKD and anemia.  She recently has c/o diaphoresis of her lower body and feeling cold above her waist (wearing sweaters).    Review of Systems:  Review of Systems  Constitutional: Positive for diaphoresis. Negative for fever, chills, weight loss and malaise/fatigue.  Eyes: Negative for blurred vision.  Respiratory: Negative for cough and shortness of breath.   Cardiovascular: Negative for chest pain.  Gastrointestinal: Negative for abdominal pain.  Genitourinary: Negative for dysuria.       Neurogenic bladder  Musculoskeletal: Negative for falls and myalgias.  Skin: Positive for itching. Negative for rash.  Neurological: Negative for weakness and headaches.       Paraplegia  Endo/Heme/Allergies: Bruises/bleeds easily.  Psychiatric/Behavioral: Positive for depression. Negative for memory loss.    Medications: Patient's Medications  New Prescriptions   No medications on file  Previous Medications   BACLOFEN (LIORESAL) 10 MG TABLET    Take 10 mg by mouth 3 (three) times daily.   BISACODYL (FLEET) 10 MG/30ML ENEM    Place 10 mg rectally once.   CALCIUM-VITAMIN D (OYSTER CALCIUM 500 + D) 500-200 MG-UNIT PER TABLET    Take 1 tablet by mouth daily.   CARVEDILOL (COREG) 6.25 MG TABLET    Take 6.25 mg by mouth 2 (two) times daily with a meal.   CETIRIZINE (ZYRTEC) 5 MG TABLET    Take 5 mg by mouth daily.   DIPHENHYDRAMINE (BENADRYL) 25 MG TABLET    Take 25 mg by mouth every 6 (six) hours as needed.   ESCITALOPRAM (LEXAPRO) 20 MG TABLET    Take 20 mg by mouth daily.   ESOMEPRAZOLE (NEXIUM) 40 MG CAPSULE    Take 40 mg by mouth daily before breakfast.   FUROSEMIDE (LASIX) 20 MG TABLET    Take 20 mg by mouth daily.   HYDROCODONE-ACETAMINOPHEN (NORCO) 7.5-325 MG PER TABLET    Take 1 tablet by mouth every 4 (four) hours as needed.   HYDROXYCHLOROQUINE (PLAQUENIL) 200 MG TABLET    Take by mouth daily.   MENTHOL, TOPICAL ANALGESIC, (BIOFREEZE EX)    Apply topically daily.   METOCLOPRAMIDE (REGLAN) 5 MG TABLET    Take 5 mg by mouth 3 (three) times daily.   MULTIPLE VITAMIN (MULTIVITAMIN) CAPSULE    Take 1 capsule by mouth daily.   OXYBUTYNIN (DITROPAN) 5 MG TABLET    Take 5 mg by mouth 3 (three) times daily.   PROMETHAZINE (PHENERGAN) 25 MG TABLET    Take 25 mg by mouth every 6 (six) hours as needed.   TRAZODONE (DESYREL) 50 MG TABLET    Take 75 mg by mouth at bedtime.    WOUND DRESSINGS (COMBIDERM ACD) PADS    Apply topically.  Modified Medications   No medications on file  Discontinued Medications   No medications on file    Physical Exam: Filed Vitals:   03/21/13 1136  BP: 136/78  Pulse: 69  Temp: 97.5 F (36.4 C)  Resp: 18   Physical Exam  Constitutional: She is oriented to person, place, and  time. She appears well-developed and well-nourished. No distress.  All over her body, no other symptoms  Cardiovascular: Normal rate, regular rhythm, normal heart sounds and intact distal pulses.   Pulmonary/Chest: Effort normal and breath sounds normal. No respiratory distress.  Abdominal: Soft. Bowel sounds are normal. She exhibits no distension and no mass. There is no tenderness.  Neurological: She is alert and oriented to person, place, and time.  paraplegic  Skin: She is diaphoretic.  sweaty  Psychiatric: She has a normal mood and affect.   Labs reviewed: 02/24/13:  Wbc4.9,  hgb9.2/30.1, plts 255, Na 140, K 3.9, BUN 10, cr 0.6, alb 3.4, ALT 38, AST 26 03/11/13:  UA positive nitrite, negative LE, no bacteria  Assessment/Plan 1. Diaphoresis -etiology  unclear, just completed abx for a UTI -no evidence of infection or fever at this time  2. Pruritus -chronic and persistent, requested her benadryl be restarted for her chronic itching and this was done  3.  Chronic constipation -miralax x 3 days

## 2013-04-06 ENCOUNTER — Other Ambulatory Visit: Payer: Self-pay | Admitting: *Deleted

## 2013-04-06 MED ORDER — HYDROCODONE-ACETAMINOPHEN 7.5-325 MG PO TABS: ORAL_TABLET | ORAL | Status: DC

## 2013-04-19 ENCOUNTER — Non-Acute Institutional Stay (SKILLED_NURSING_FACILITY): Payer: PRIVATE HEALTH INSURANCE | Admitting: Adult Health

## 2013-04-19 DIAGNOSIS — G8220 Paraplegia, unspecified: Secondary | ICD-10-CM

## 2013-04-19 DIAGNOSIS — F329 Major depressive disorder, single episode, unspecified: Secondary | ICD-10-CM

## 2013-04-19 DIAGNOSIS — R609 Edema, unspecified: Secondary | ICD-10-CM

## 2013-04-19 DIAGNOSIS — F32A Depression, unspecified: Secondary | ICD-10-CM

## 2013-04-19 DIAGNOSIS — K219 Gastro-esophageal reflux disease without esophagitis: Secondary | ICD-10-CM

## 2013-04-19 DIAGNOSIS — M069 Rheumatoid arthritis, unspecified: Secondary | ICD-10-CM

## 2013-04-19 DIAGNOSIS — K5900 Constipation, unspecified: Secondary | ICD-10-CM

## 2013-04-19 DIAGNOSIS — F3289 Other specified depressive episodes: Secondary | ICD-10-CM

## 2013-04-19 DIAGNOSIS — I10 Essential (primary) hypertension: Secondary | ICD-10-CM

## 2013-04-19 DIAGNOSIS — N319 Neuromuscular dysfunction of bladder, unspecified: Secondary | ICD-10-CM

## 2013-05-01 ENCOUNTER — Encounter: Payer: Self-pay | Admitting: Adult Health

## 2013-05-01 DIAGNOSIS — R609 Edema, unspecified: Secondary | ICD-10-CM | POA: Insufficient documentation

## 2013-05-01 NOTE — Assessment & Plan Note (Signed)
She is presently not taking any medications; her foley has been removed; she states she has no control at all of her urine and that urine leaks out all the time. After a prolonged discussion she would like to see urology; will make referral.

## 2013-05-01 NOTE — Assessment & Plan Note (Signed)
Is stable will continue lasix 20 mg daily and will monitor

## 2013-05-01 NOTE — Assessment & Plan Note (Addendum)
She is stable will continue plaquenil 200 mg daily will continue biofreeze to left shoulder daily  Has vicodin 7.5/325 mg every 4 hours as needed for pain will continue ca++ with d twice daily

## 2013-05-01 NOTE — Assessment & Plan Note (Signed)
Is stable will continue dulcolax supp every other day and miralax daily as needed

## 2013-05-01 NOTE — Assessment & Plan Note (Signed)
She is doing well with nexium 40 mg daily and reglan 5 mg prior to meals. Will monitor

## 2013-05-01 NOTE — Assessment & Plan Note (Signed)
No change in status; has baclofen 10 mg three times daily as needed for spasticity will monitor

## 2013-05-01 NOTE — Assessment & Plan Note (Signed)
She is stable will continue coreg 6.25 mg twice daily and will monitor

## 2013-05-01 NOTE — Assessment & Plan Note (Addendum)
She is emotionally stable will continue lexapro 15 mg daily will continue trazodone 75 mg nightly for sleep and will monitor her

## 2013-05-01 NOTE — Progress Notes (Signed)
Patient ID: [name], female   DOB: Mar 11, 1951, 62 y.o.   MRN: [id number]  [facility]  No Known Allergies   Chief Complaint  Patient presents with  . Medical Managment of Chronic Issues    HPI: She is being treated for the management of her chronic illnesses. The nursing staff is not voicing any concerns at this time. She is telling me that she has had her foley removed this past spring and  No longer has any control of her urine and feels like it is constantly leaking out of her. She was treated for an uti in August of this year.     Past Medical History  Diagnosis Date  . Immobility syndrome (paraplegic)   . GERD (gastroesophageal reflux disease) 02/02/2009  . Leiomyoma of uterus   . Anemia 10/12/2008  . Hypertension 10/12/2008  . Chronic kidney disease   . Cancer     rt breast  . CHF (congestive heart failure)   . Malignant neoplasm of breast (female), unspecified site 10/05/2010  . Gastroparesis 10/05/2010  . Diaphragmatic hernia without mention of obstruction or gangrene 08/29/2010  . Hypotension, unspecified 05/20/2009  . Edema 05/20/2009  . Rheumatoid arthritis 01/17/2009  . Atherosclerosis of native arteries of the extremities, unspecified 11/06/2008  . Iron deficiency anemia, unspecified 10/12/2008    Past Surgical History  Procedure Laterality Date  . Breast surgery  2003    Right mastectomy  . Wound debridement  09/02/2008    Large sacral back open wound  . Pressure ulcer debridement  2009    on back    VITAL SIGNS BP 122/74  Pulse 66  Ht 5' 5"  (1.651 m)  Wt 171 lb (77.565 kg)  BMI 28.46 kg/m2   Patient's Medications  New Prescriptions   No medications on file  Previous Medications   BACLOFEN (LIORESAL) 10 MG TABLET    Take 10 mg by mouth 3 (three) times daily.   BISACODYL (DULCOLAX) 10 MG SUPPOSITORY    Place 10 mg rectally every other day.    CALCIUM-VITAMIN D (OYSTER CALCIUM 500 + D) 500-200 MG-UNIT PER TABLET    Take 1 tablet by  mouth 2 (two) times daily.    CARVEDILOL (COREG) 6.25 MG TABLET    Take 6.25 mg by mouth 2 (two) times daily with a meal.   ESCITALOPRAM (LEXAPRO) 20 MG TABLET    Take 15 mg by mouth daily.    ESOMEPRAZOLE (NEXIUM) 40 MG CAPSULE    Take 40 mg by mouth daily before breakfast.   FUROSEMIDE (LASIX) 20 MG TABLET    Take 20 mg by mouth daily.   HYDROCODONE-ACETAMINOPHEN (NORCO) 7.5-325 MG PER TABLET    Take one tablet by mouth every 4 hours as needed for pain   HYDROXYCHLOROQUINE (PLAQUENIL) 200 MG TABLET    Take by mouth daily.   HYDROXYZINE (ATARAX/VISTARIL) 25 MG TABLET    Take 25 mg by mouth every 8 (eight) hours as needed for itching.   MENTHOL, TOPICAL ANALGESIC, (BIOFREEZE EX)    Apply topically daily.   METOCLOPRAMIDE (REGLAN) 5 MG TABLET    Take 5 mg by mouth 3 (three) times daily.   MULTIPLE VITAMIN (MULTIVITAMIN) CAPSULE    Take 1 capsule by mouth daily.   POLYETHYLENE GLYCOL (MIRALAX / GLYCOLAX) PACKET    Take 17 g by mouth daily as needed.   TRAZODONE (DESYREL) 50 MG TABLET    Take 75 mg by mouth at bedtime.   Modified Medications  No medications on file  Discontinued Medications   CETIRIZINE (ZYRTEC) 5 MG TABLET    Take 5 mg by mouth daily.   OXYBUTYNIN (DITROPAN) 5 MG TABLET    Take 5 mg by mouth 3 (three) times daily.   PROMETHAZINE (PHENERGAN) 25 MG TABLET    Take 25 mg by mouth every 6 (six) hours as needed.   TAZAROTENE (TAZORAC) 0.05 % CREAM    Apply 1 application topically daily as needed.    SIGNIFICANT DIAGNOSTIC EXAMS    LABS REVIEWED:  02-24-13: wbc 4.9; hgb 9.2; hct 30.1; mcv 63.8; plt 255; glucose 104; bun 10; creat 0.60; k+3.9; na++140; liver normal albumin 3.4 03-13-13: urine culture: e-coli: septra ds.   Review of Systems  Constitutional: Negative for fever and malaise/fatigue.  Respiratory: Negative for cough and shortness of breath.   Cardiovascular: Negative for chest pain, palpitations and leg swelling.  Gastrointestinal: Negative for heartburn, abdominal  pain and constipation.  Genitourinary: Negative for dysuria.  Musculoskeletal: Negative for myalgias and joint pain.  Skin: Negative.   Neurological: Negative for headaches.  Psychiatric/Behavioral: Negative for depression. The patient does not have insomnia.     Physical Exam  Constitutional: She is oriented to person, place, and time. She appears well-developed and well-nourished.  Neck: Neck supple. No JVD present.  Cardiovascular: Normal rate, regular rhythm and intact distal pulses.   Respiratory: Effort normal and breath sounds normal. No respiratory distress. She has no wheezes.  GI: Soft. Bowel sounds are normal. She exhibits no distension. There is no tenderness.  Musculoskeletal: She exhibits no edema.  Has no movement in lower extremities; has full range of motion in upper extremities.   Neurological: She is alert and oriented to person, place, and time.  Skin: Skin is warm and dry.  Psychiatric: She has a normal mood and affect.       ASSESSMENT/ PLAN:  HTN (hypertension) She is stable will continue coreg 6.25 mg twice daily and will monitor   GERD (gastroesophageal reflux disease) She is doing well with nexium 40 mg daily and reglan 5 mg prior to meals. Will monitor   Unspecified constipation Is stable will continue dulcolax supp every other day and miralax daily as needed   Rheumatoid arthritis She is stable will continue plaquenil 200 mg daily will continue biofreeze to left shoulder daily  Has vicodin 7.5/325 mg every 4 hours as needed for pain will continue ca++ with d twice daily   Depression She is emotionally stable will continue lexapro 15 mg daily will continue trazodone 75 mg nightly for sleep and will monitor her   Neurogenic bladder She is presently not taking any medications; her foley has been removed; she states she has no control at all of her urine and that urine leaks out all the time. After a prolonged discussion she would like to see urology;  will make referral.   Paraplegia No change in status; has baclofen 10 mg three times daily as needed for spasticity will monitor   Edema Is stable will continue lasix 20 mg daily and will monitor    Time spent with patient 50 minutes.

## 2013-05-18 ENCOUNTER — Non-Acute Institutional Stay (SKILLED_NURSING_FACILITY): Payer: PRIVATE HEALTH INSURANCE | Admitting: Internal Medicine

## 2013-05-18 ENCOUNTER — Encounter: Payer: Self-pay | Admitting: Internal Medicine

## 2013-05-18 DIAGNOSIS — G8220 Paraplegia, unspecified: Secondary | ICD-10-CM

## 2013-05-18 DIAGNOSIS — K219 Gastro-esophageal reflux disease without esophagitis: Secondary | ICD-10-CM

## 2013-05-18 DIAGNOSIS — K5900 Constipation, unspecified: Secondary | ICD-10-CM

## 2013-05-18 DIAGNOSIS — I10 Essential (primary) hypertension: Secondary | ICD-10-CM

## 2013-05-18 DIAGNOSIS — M069 Rheumatoid arthritis, unspecified: Secondary | ICD-10-CM

## 2013-05-18 DIAGNOSIS — F329 Major depressive disorder, single episode, unspecified: Secondary | ICD-10-CM

## 2013-05-18 DIAGNOSIS — R609 Edema, unspecified: Secondary | ICD-10-CM

## 2013-05-18 DIAGNOSIS — F32A Depression, unspecified: Secondary | ICD-10-CM

## 2013-05-18 DIAGNOSIS — F3289 Other specified depressive episodes: Secondary | ICD-10-CM

## 2013-05-18 NOTE — Progress Notes (Signed)
Patient ID: [name], female   DOB: [birth year], 62 y.o.   MRN: [id number]  Chief Complaint  Patient presents with  . Medical Managment of Chronic Issues   No Known Allergies  [facility]  Code- full code  HPI: 62 y/o female patient is seen for routine follow up visit. She denies any complaint. She has been sleeping well. No new skin concerns. No behavioral concerns. No falls reported. No concerns from staff  Review of Systems  Constitutional: Negative for fever and malaise/fatigue.  Respiratory: Negative for cough and shortness of breath.   Cardiovascular: Negative for chest pain, palpitations and leg swelling.  Gastrointestinal: Negative for heartburn, abdominal pain and constipation.  Genitourinary: Negative for dysuria.  Musculoskeletal: Negative for myalgias and joint pain.  Skin: Negative.   Neurological: Negative for headaches.  Psychiatric/Behavioral: Negative for depression. The patient does not have insomnia.    Past Medical History  Diagnosis Date  . Immobility syndrome (paraplegic)   . GERD (gastroesophageal reflux disease) 02/02/2009  . Leiomyoma of uterus   . Anemia 10/12/2008  . Hypertension 10/12/2008  . Chronic kidney disease   . Cancer     rt breast  . CHF (congestive heart failure)   . Malignant neoplasm of breast (female), unspecified site 10/05/2010  . Gastroparesis 10/05/2010  . Diaphragmatic hernia without mention of obstruction or gangrene 08/29/2010  . Hypotension, unspecified 05/20/2009  . Edema 05/20/2009  . Rheumatoid arthritis 01/17/2009  . Atherosclerosis of native arteries of the extremities, unspecified 11/06/2008  . Iron deficiency anemia, unspecified 10/12/2008   Past Surgical History  Procedure Laterality Date  . Breast surgery  2003    Right mastectomy  . Wound debridement  09/02/2008    Large sacral back open wound  . Pressure ulcer debridement  2009    on back   Medication reviewed. See [facility]  Physical Exam  BP  112/68  Pulse 86  Temp(Src) 97.7 F (36.5 C)  Resp 18  Ht 5\' 5"  (1.651 m)  Wt 167 lb (75.751 kg)  BMI 27.79 kg/m2  SpO2 97%  Constitutional: She is oriented to person, place, and time. She appears well-developed and well-nourished.  Neck: Neck supple. No JVD present.  Cardiovascular: Normal rate, regular rhythm and intact distal pulses.   Respiratory: Effort normal and breath sounds normal. No respiratory distress. She has no wheezes.  GI: Soft. Bowel sounds are normal. She exhibits no distension. There is no tenderness.  Musculoskeletal: She exhibits no edema.  Has no movement in lower extremities, has full range of motion in upper extremities.   Neurological: She is alert and oriented to person, place, and time.  Skin: Skin is warm and dry.  Psychiatric: She has a normal mood and affect.   LABS REVIEWED:   02-24-13: wbc 4.9; hgb 9.2; hct 30.1; mcv 63.8; plt 255; glucose 104; bun 10; creat 0.60; k+3.9; na++140; liver normal albumin 3.4 03-13-13: urine culture: e-coli: septra ds.    ASSESSMENT/ PLAN:  HTN (hypertension) continue coreg 6.25 mg twice daily and will monitor   Paraplegia Stable currently. Will decrease baclofen to 10 mg bid and monitor   Depression emotionally stable and will continue lexapro 15 mg daily and trazodone 75 mg nightly   Rheumatoid arthritis No recent flare ups. Continue plaquenil 200 mg daily and biofreeze to left shoulder daily  Has vicodin 7.5/325 mg every 4 hours as needed for pain. continue ca++ with d twice daily   Edema Is stable and will continue lasix 20  mg daily and will monitor.check bmp periodically  GERD (gastroesophageal reflux disease) Continue nexium 40 mg daily and reglan 5 mg prior to meals. Will monitor for now  Unspecified constipation continue dulcolax supp every other day and miralax daily as needed

## 2013-06-02 ENCOUNTER — Non-Acute Institutional Stay (SKILLED_NURSING_FACILITY): Payer: PRIVATE HEALTH INSURANCE | Admitting: Internal Medicine

## 2013-06-02 DIAGNOSIS — A498 Other bacterial infections of unspecified site: Secondary | ICD-10-CM

## 2013-06-02 DIAGNOSIS — B9620 Unspecified Escherichia coli [E. coli] as the cause of diseases classified elsewhere: Secondary | ICD-10-CM

## 2013-06-02 DIAGNOSIS — N390 Urinary tract infection, site not specified: Secondary | ICD-10-CM

## 2013-06-02 NOTE — Progress Notes (Signed)
Patient ID: [name], female   DOB: 02/12/1951, 62 y.o.   MRN: [id number]  [name] living [place]  No Known Allergies  Chief Complaint  Patient presents with  . Acute Visit    urinary frequency   HPI 62 y/o female patient is seen in her room for urinary complaints. She has been having increased urinary frequency for a week and has noticed strong odor in her urine. Denies any dysuria but she has hx of neurogenic bladder. She also denies any fever or chills. No nausea or vomiting. Appetite is good  No other complaints  ROS No chest pain or dyspnea No bowel complaints No abdominal pain, nausea or vomiting Rest of ros negative. See hpi  Past Medical History  Diagnosis Date  . Immobility syndrome (paraplegic)   . GERD (gastroesophageal reflux disease) 02/02/2009  . Leiomyoma of uterus   . Anemia 10/12/2008  . Hypertension 10/12/2008  . Chronic kidney disease   . Cancer     rt breast  . CHF (congestive heart failure)   . Malignant neoplasm of breast (female), unspecified site 10/05/2010  . Gastroparesis 10/05/2010  . Diaphragmatic hernia without mention of obstruction or gangrene 08/29/2010  . Hypotension, unspecified 05/20/2009  . Edema 05/20/2009  . Rheumatoid arthritis 01/17/2009  . Atherosclerosis of native arteries of the extremities, unspecified 11/06/2008  . Iron deficiency anemia, unspecified 10/12/2008   Medication reviewed. See [facility]  Physical exam Vitals are stable, afebrile  Constitutional: She is oriented to person, place, and time. She appears well-developed and well-nourished.   Neck: Neck supple. No JVD present.   Cardiovascular: Normal rate, regular rhythm and intact distal pulses.    Respiratory: Effort normal and breath sounds normal. No respiratory distress. She has no wheezes.   GI: Soft. Bowel sounds are normal. She exhibits no distension. There is no tenderness.  Musculoskeletal: She exhibits no edema.  Has no movement in lower extremities,  has full range of motion in upper extremities.   Neurological: She is alert and oriented to person, place, and time.   Skin: Skin is warm and dry.  Psychiatric: She has a normal mood and affect.    Labs- 05/29/13 u/a has cloudy appearance and positive for LE and nitrite Culture has e.coli   Assessment/plan  uti- with e.coli uti, will start her on bactrim ds 1 tab q12h for a week. Will also have her on florastor 250 mg  bidfor 2 weeks. Encouraged hydration. Monitor for fever or chills

## 2013-07-14 ENCOUNTER — Non-Acute Institutional Stay (SKILLED_NURSING_FACILITY): Payer: PRIVATE HEALTH INSURANCE | Admitting: Internal Medicine

## 2013-07-14 DIAGNOSIS — K5900 Constipation, unspecified: Secondary | ICD-10-CM

## 2013-07-14 DIAGNOSIS — G4700 Insomnia, unspecified: Secondary | ICD-10-CM | POA: Insufficient documentation

## 2013-07-14 DIAGNOSIS — G8220 Paraplegia, unspecified: Secondary | ICD-10-CM

## 2013-07-14 DIAGNOSIS — R609 Edema, unspecified: Secondary | ICD-10-CM

## 2013-07-14 DIAGNOSIS — M069 Rheumatoid arthritis, unspecified: Secondary | ICD-10-CM

## 2013-07-14 DIAGNOSIS — F329 Major depressive disorder, single episode, unspecified: Secondary | ICD-10-CM

## 2013-07-14 DIAGNOSIS — I10 Essential (primary) hypertension: Secondary | ICD-10-CM

## 2013-07-14 DIAGNOSIS — K219 Gastro-esophageal reflux disease without esophagitis: Secondary | ICD-10-CM

## 2013-07-14 DIAGNOSIS — F32A Depression, unspecified: Secondary | ICD-10-CM

## 2013-07-14 DIAGNOSIS — F3289 Other specified depressive episodes: Secondary | ICD-10-CM

## 2013-07-14 NOTE — Progress Notes (Signed)
Patient ID: [name], female   DOB: 13-Oct-1950, 62 y.o.   MRN: [id number]    [name] living [employer]  Chief Complaint  Patient presents with  . Medical Managment of Chronic Issues   No Known Allergies  Code- full code  HPI: 62 y/o female patient is seen for routine follow up visit. She denies any complaint. No new skin concerns. No behavioral concerns. No falls reported. No concerns from staff  Review of Systems   Constitutional: Negative for fever and malaise/fatigue.   Respiratory: Negative for cough and shortness of breath.    Cardiovascular: Negative for chest pain, palpitations and leg swelling.   Gastrointestinal: Negative for heartburn, abdominal pain and constipation.   Genitourinary: Negative for dysuria.   Musculoskeletal: Negative for myalgias and joint pain.   Skin: Negative.    Neurological: Negative for headaches.   Psychiatric/Behavioral: Negative for depression. The patient does not have insomnia.    Past Medical History  Diagnosis Date  . Immobility syndrome (paraplegic)   . GERD (gastroesophageal reflux disease) 02/02/2009  . Leiomyoma of uterus   . Anemia 10/12/2008  . Hypertension 10/12/2008  . Chronic kidney disease   . Cancer     rt breast  . CHF (congestive heart failure)   . Malignant neoplasm of breast (female), unspecified site 10/05/2010  . Gastroparesis 10/05/2010  . Diaphragmatic hernia without mention of obstruction or gangrene 08/29/2010  . Hypotension, unspecified 05/20/2009  . Edema 05/20/2009  . Rheumatoid arthritis 01/17/2009  . Atherosclerosis of native arteries of the extremities, unspecified 11/06/2008  . Iron deficiency anemia, unspecified 10/12/2008   Medication reviewed. See MAR  BP 132/68  Pulse 68  Temp(Src) 98 F (36.7 C)  Resp 16  SpO2 96%  Constitutional: She is oriented to person, place, and time. She appears well-developed and well-nourished.   Neck: Neck supple. No JVD present.   Cardiovascular: Normal  rate, regular rhythm and intact distal pulses.    Respiratory: Effort normal and breath sounds normal. No respiratory distress. She has no wheezes.   GI: Soft. Bowel sounds are normal. She exhibits no distension. There is no tenderness.  Musculoskeletal: She exhibits no edema.  Has no movement in lower extremities, has full range of motion in upper extremities.   Neurological: She is alert and oriented to person, place, and time.   Skin: Skin is warm and dry.  Psychiatric: She has a normal mood and affect.   LABS REVIEWED:   02-24-13: wbc 4.9; hgb 9.2; hct 30.1; mcv 63.8; plt 255; glucose 104; bun 10; creat 0.60; k+3.9; na++140; liver normal albumin 3.4 03-13-13: urine culture: e-coli: septra ds.    ASSESSMENT/ PLAN:  HTN  continue coreg 6.25 mg twice daily and lasix and will monitor   Edema Is stable and will continue lasix 20 mg daily and will monitor.check bmp periodically  Paraplegia Stable currently. continue baclofen to 10 mg bid and monitor   Depression emotionally stable and will continue lexapro 15 mg daily  Insomnia continue trazodone 75 mg nightly   GERD Continue nexium 40 mg daily and reglan 5 mg prior to meals. Will monitor for now  Rheumatoid arthritis No recent flare ups. Continue plaquenil 200 mg daily and biofreeze to left shoulder daily  Has vicodin 7.5/325 mg every 4 hours as needed for pain. continue ca++ with d twice daily   Unspecified constipation continue dulcolax supp every other day and miralax daily as needed

## 2013-08-21 ENCOUNTER — Non-Acute Institutional Stay (SKILLED_NURSING_FACILITY): Payer: PRIVATE HEALTH INSURANCE | Admitting: Internal Medicine

## 2013-08-21 ENCOUNTER — Encounter: Payer: Self-pay | Admitting: Internal Medicine

## 2013-08-21 DIAGNOSIS — M069 Rheumatoid arthritis, unspecified: Secondary | ICD-10-CM

## 2013-08-21 DIAGNOSIS — G4700 Insomnia, unspecified: Secondary | ICD-10-CM

## 2013-08-21 DIAGNOSIS — K219 Gastro-esophageal reflux disease without esophagitis: Secondary | ICD-10-CM

## 2013-08-21 DIAGNOSIS — G8220 Paraplegia, unspecified: Secondary | ICD-10-CM

## 2013-08-21 DIAGNOSIS — F329 Major depressive disorder, single episode, unspecified: Secondary | ICD-10-CM

## 2013-08-21 DIAGNOSIS — F3289 Other specified depressive episodes: Secondary | ICD-10-CM

## 2013-08-21 DIAGNOSIS — I10 Essential (primary) hypertension: Secondary | ICD-10-CM

## 2013-08-21 DIAGNOSIS — F32A Depression, unspecified: Secondary | ICD-10-CM

## 2013-08-21 DIAGNOSIS — N390 Urinary tract infection, site not specified: Secondary | ICD-10-CM

## 2013-08-21 NOTE — Progress Notes (Signed)
Patient ID: [name], female   DOB: [birth year], 63 y.o.   MRN: [id number]    [name] living [employer]  Chief Complaint  Patient presents with  . Acute Visit    urinry complaints  . Medical Managment of Chronic Issues   No Known Allergies  Code- full code  HPI 63 y/o female patient is seen for routine follow up visit. She has been having strong odor to her urine and increased frequency. She has hx of neurogenic bladder. Her urine culture grew e.coli and she was started on cefexime yesterday. Pt is afebrile, alert and oriented. She would like to know if she needs long term antibiotic. No other concerns. Appetite is good. No new skin concerns. No behavioral concerns. No falls reported. No concerns from staff  Review of Systems   Constitutional: Negative for fever and malaise/fatigue.   Respiratory: Negative for cough and shortness of breath.    Cardiovascular: Negative for chest pain, palpitations and leg swelling.   Gastrointestinal: Negative for heartburn, abdominal pain and constipation.   Genitourinary: has neurogenic bladder Musculoskeletal: Negative for myalgias and joint pain.   Skin: Negative.    Neurological: Negative for headaches.   Psychiatric/Behavioral: Negative for depression. The patient does not have insomnia.    Past Medical History  Diagnosis Date  . Immobility syndrome (paraplegic)   . GERD (gastroesophageal reflux disease) 02/02/2009  . Leiomyoma of uterus   . Anemia 10/12/2008  . Hypertension 10/12/2008  . Chronic kidney disease   . Cancer     rt breast  . CHF (congestive heart failure)   . Malignant neoplasm of breast (female), unspecified site 10/05/2010  . Gastroparesis 10/05/2010  . Diaphragmatic hernia without mention of obstruction or gangrene 08/29/2010  . Hypotension, unspecified 05/20/2009  . Edema 05/20/2009  . Rheumatoid arthritis 01/17/2009  . Atherosclerosis of native arteries of the extremities, unspecified 11/06/2008  . Iron  deficiency anemia, unspecified 10/12/2008   Medication reviewed. See [facility]  Physical exam BP 140/78  Pulse 88  Temp(Src) 97.5 F (36.4 C)  Resp 18  Constitutional: She is oriented to person, place, and time. She appears well-developed and well-nourished.   Neck: Neck supple. No JVD present.   Cardiovascular: Normal rate, regular rhythm and intact distal pulses.    Respiratory: Effort normal and breath sounds normal. No respiratory distress. She has no wheezes.   GI: Soft. Bowel sounds are normal. She exhibits no distension. There is some suprapubic tenderness.  Musculoskeletal: She exhibits no edema.  Has no movement in lower extremities, has full range of motion in upper extremities.   Neurological: She is alert and oriented to person, place, and time.   Skin: Skin is warm and dry.  Psychiatric: She has a normal mood and affect.   LABS REVIEWED:   02-24-13: wbc 4.9; hgb 9.2; hct 30.1; mcv 63.8; plt 255; glucose 104; bun 10; creat 0.60; k+3.9; na++140; liver normal albumin 3.4 03-13-13: urine culture: e-coli: septra ds.  08-19-13 urine culture e.coli  ASSESSMENT/ PLAN:  UTI E.coli uti and has had recurrent e.coli growing. i have concerns for colonization. Will complete course of suprax for a week with florastor. Explained to pt that studies have not shown benefit with prophylactic antibiotics and will have her on cranberry capsule instead for now and monitor clinically. Pt voices understanding this. Encouraged hydration  Insomnia Continue trazodone at bedtime and monitor  Rheumatoid arthritis No recent flare ups. Continue plaquenil 200 mg daily and biofreeze to left shoulder daily  Has vicodin  7.5/325 mg every 4 hours as needed for pain. continue calcium with d twice daily . Check cbc and vitamin d level  HTN  continue coreg 6.25 mg twice daily and lasix 20 mg daily and will monitor her bp readings. Check bmp  Depression continue lexapro 15 mg daily and trazodone 75 mg nightly.  Mood is stable at present  Paraplegia Stable currently. Tolerating baclofen 10 mg bid and monitor   GERD  Continue nexium 40 mg daily and reglan 5 mg prior to meals. Will monitor for now  Labs- cbc, cmp, vit d

## 2013-10-04 ENCOUNTER — Other Ambulatory Visit: Payer: Self-pay | Admitting: *Deleted

## 2013-10-04 MED ORDER — HYDROCODONE-ACETAMINOPHEN 7.5-325 MG PO TABS: ORAL_TABLET | ORAL | Status: DC

## 2013-10-04 NOTE — Telephone Encounter (Signed)
[place]

## 2013-10-20 ENCOUNTER — Encounter: Payer: Self-pay | Admitting: Internal Medicine

## 2013-10-20 ENCOUNTER — Non-Acute Institutional Stay (SKILLED_NURSING_FACILITY): Payer: PRIVATE HEALTH INSURANCE | Admitting: Internal Medicine

## 2013-10-20 DIAGNOSIS — L299 Pruritus, unspecified: Secondary | ICD-10-CM

## 2013-10-20 DIAGNOSIS — G8220 Paraplegia, unspecified: Secondary | ICD-10-CM

## 2013-10-20 DIAGNOSIS — K219 Gastro-esophageal reflux disease without esophagitis: Secondary | ICD-10-CM

## 2013-10-20 DIAGNOSIS — K5900 Constipation, unspecified: Secondary | ICD-10-CM

## 2013-10-20 DIAGNOSIS — J069 Acute upper respiratory infection, unspecified: Secondary | ICD-10-CM

## 2013-10-20 DIAGNOSIS — F3289 Other specified depressive episodes: Secondary | ICD-10-CM

## 2013-10-20 DIAGNOSIS — I10 Essential (primary) hypertension: Secondary | ICD-10-CM

## 2013-10-20 DIAGNOSIS — F32A Depression, unspecified: Secondary | ICD-10-CM

## 2013-10-20 DIAGNOSIS — M069 Rheumatoid arthritis, unspecified: Secondary | ICD-10-CM

## 2013-10-20 DIAGNOSIS — F329 Major depressive disorder, single episode, unspecified: Secondary | ICD-10-CM

## 2013-10-20 NOTE — Progress Notes (Signed)
Patient ID: [name], female   DOB: [birth year], 63 y.o.   MRN: [id number]    [name] living [facility] [employer]  Chief Complaint  Patient presents with  . Medical Managment of Chronic Issues    cough, congestion, routine visit   No Known Allergies  Code- full code  HPI 63 y/o female patient is seen for routine follow up visit. She has been having cough and congestion for few days. She has been on zyrtec without any help. No fever or chills but her appetite has decreased. Cough is productive with yellow phlegm. She feels stuffed in her nose. Her constipation has worsened and current medication is not helping her. No other complaints. Leg pain is under control.   Review of Systems   Constitutional: Negative for fever and malaise/fatigue.   Respiratory: Negative for shortness of breath.    Cardiovascular: Negative for chest pain, palpitations and leg swelling.   Gastrointestinal: Negative for heartburn, abdominal pain   Genitourinary: has neurogenic bladder Musculoskeletal: Negative for myalgias and joint pain.   Skin: Negative.    Neurological: Negative for headaches.   Psychiatric/Behavioral: Negative for depression. The patient does not have insomnia  Past Medical History  Diagnosis Date  . Immobility syndrome (paraplegic)   . GERD (gastroesophageal reflux disease) 02/02/2009  . Leiomyoma of uterus   . Anemia 10/12/2008  . Hypertension 10/12/2008  . Chronic kidney disease   . Cancer     rt breast  . CHF (congestive heart failure)   . Malignant neoplasm of breast (female), unspecified site 10/05/2010  . Gastroparesis 10/05/2010  . Diaphragmatic hernia without mention of obstruction or gangrene 08/29/2010  . Hypotension, unspecified 05/20/2009  . Edema 05/20/2009  . Rheumatoid arthritis 01/17/2009  . Atherosclerosis of native arteries of the extremities, unspecified 11/06/2008  . Iron deficiency anemia, unspecified 10/12/2008   Current Outpatient Prescriptions on File Prior  to Visit  Medication Sig Dispense Refill  . baclofen (LIORESAL) 10 MG tablet Take 10 mg by mouth 2 (two) times daily.       . bisacodyl (DULCOLAX) 10 MG suppository Place 10 mg rectally every other day.       . calcium-vitamin D (OYSTER CALCIUM 500 + D) 500-200 MG-UNIT per tablet Take 1 tablet by mouth 2 (two) times daily.       . carvedilol (COREG) 6.25 MG tablet Take 6.25 mg by mouth 2 (two) times daily with a meal.      . escitalopram (LEXAPRO) 20 MG tablet Take 15 mg by mouth daily.       Marland Kitchen esomeprazole (NEXIUM) 40 MG capsule Take 40 mg by mouth daily before breakfast.      . furosemide (LASIX) 20 MG tablet Take 20 mg by mouth daily.      Marland Kitchen HYDROcodone-acetaminophen (NORCO) 7.5-325 MG per tablet Take one tablet by mouth every 4 hours as needed for pain  180 tablet  0  . hydroxychloroquine (PLAQUENIL) 200 MG tablet Take by mouth daily.      . hydrOXYzine (ATARAX/VISTARIL) 25 MG tablet Take 25 mg by mouth every 8 (eight) hours as needed for itching.      . Menthol, Topical Analgesic, (BIOFREEZE EX) Apply topically daily.      . metoCLOPramide (REGLAN) 5 MG tablet Take 5 mg by mouth 3 (three) times daily.      . Multiple Vitamin (MULTIVITAMIN) capsule Take 1 capsule by mouth daily.      . polyethylene glycol (MIRALAX / GLYCOLAX) packet Take 17 g by mouth  daily as needed.      . traZODone (DESYREL) 50 MG tablet Take 75 mg by mouth at bedtime.        No current facility-administered medications on file prior to visit.   Past Surgical History  Procedure Laterality Date  . Breast surgery  2003    Right mastectomy  . Wound debridement  09/02/2008    Large sacral back open wound  . Pressure ulcer debridement  2009    on back   Physical exam BP 122/62  Pulse 62  Temp(Src) 97.7 F (36.5 C)  Resp 18  Ht 5\' 5"  (1.651 m)  Wt 168 lb (76.204 kg)  BMI 27.96 kg/m2  SpO2 96%  Constitutional: She is oriented to person, place, and time. She appears well-developed and well-nourished. Nose:  swollen turbinates, red mucosa, clear nasal discharge, no maxillary or frontal sinus tenderness Throat: mild oropharynx erythema, no exudates   Neck: Neck supple. No JVD present. No cervical adneopathy Cardiovascular: Normal rate, regular rhythm and intact distal pulses.    Respiratory: Effort normal and breath sounds normal. No respiratory distress. She has no wheezes.   GI: Soft. Bowel sounds are normal. She exhibits no distension. There is some suprapubic tenderness.  Musculoskeletal: She exhibits no edema. Has no movement in lower extremities, has full range of motion in upper extremities.   Neurological: She is alert and oriented to person, place, and time.   Skin: Skin is warm and dry.  Psychiatric: She has a normal mood and affect.   LABS REVIEWED:   02-24-13: wbc 4.9; hgb 9.2; hct 30.1; mcv 63.8; plt 255; glucose 104; bun 10; creat 0.60; k+3.9; na++140; liver normal albumin 3.4 03-13-13: urine culture: e-coli: septra ds.   08-19-13 urine culture e.coli 08-23-13 wbc 5.7, hb 9.5, hct 30.2, plt 259, na 139, k 3.5, bun 7, cr 0.68, glu 98, ca 8.9  ASSESSMENT/ PLAN:  URI Will start her on mucinex 10 cc q8h prn for cough and start her on 5 days course of z pack. Reassess if no improvement. Discontinue zyrtec  constipation Mostly bed bound, also on opioids for pain. Will d/c her prior miralax, bisacodyl for now and have her on linzess 145 mg daily  Paraplegia Stable currently. Tolerating baclofen 10 mg bid and monitor   Pruritis Prn benadryl has been helpful, continue this and monitor  Insomnia Continue trazodone at bedtime and monitor  HTN   continue coreg 6.25 mg twice daily and lasix 20 mg daily and will monitor her bp readings.   Depression continue lexapro 15 mg daily and trazodone 75 mg nightly. Mood is stable at present  Rheumatoid arthritis No recent flare ups. Continue plaquenil 200 mg daily and biofreeze to left shoulder daily  Has vicodin 7.5/325 mg every 4 hours as  needed for pain. continue calcium with d twice daily . Check cbc and vitamin d level  GERD   Continue nexium 40 mg daily and reglan 5 mg prior to meals. Will monitor for now   [facility], MD  [facility] [phone] (Monday-Friday 8 am - 5 pm) [phone] (afterhours)

## 2013-12-08 ENCOUNTER — Encounter: Payer: Self-pay | Admitting: Internal Medicine

## 2013-12-08 ENCOUNTER — Non-Acute Institutional Stay (SKILLED_NURSING_FACILITY): Payer: PRIVATE HEALTH INSURANCE | Admitting: Internal Medicine

## 2013-12-08 DIAGNOSIS — K5900 Constipation, unspecified: Secondary | ICD-10-CM

## 2013-12-08 DIAGNOSIS — I10 Essential (primary) hypertension: Secondary | ICD-10-CM

## 2013-12-08 DIAGNOSIS — J45909 Unspecified asthma, uncomplicated: Secondary | ICD-10-CM

## 2013-12-08 DIAGNOSIS — K219 Gastro-esophageal reflux disease without esophagitis: Secondary | ICD-10-CM

## 2013-12-08 DIAGNOSIS — M069 Rheumatoid arthritis, unspecified: Secondary | ICD-10-CM

## 2013-12-08 NOTE — Progress Notes (Signed)
Patient ID: [name], female   DOB: 11-12-1950, 63 y.o.   MRN: [id number]    [name] living [employer] Chief Complaint  Patient presents with  . Medical Management of Chronic Issues    routine visit   No Known Allergies  Code- full code  HPI 63 y/o female patient is seen for routine follow up visit. She continues to have some cough with clear phlegm. Denies dyspnea or chest pain. She has been on zyrtec without any help. No fever or chills. linzess is helping with her bowel movement  Review of Systems   Constitutional: Negative for fever and malaise/fatigue.   Respiratory: Negative for shortness of breath.    Cardiovascular: Negative for chest pain, palpitations and leg swelling.   Gastrointestinal: Negative for heartburn, abdominal pain   Genitourinary: has neurogenic bladder Musculoskeletal: Negative for myalgias and joint pain.   Skin: Negative.    Neurological: Negative for headaches.   Psychiatric/Behavioral: Negative for depression. The patient does not have insomnia    Past Medical History  Diagnosis Date  . Immobility syndrome (paraplegic)   . GERD (gastroesophageal reflux disease) 02/02/2009  . Leiomyoma of uterus   . Anemia 10/12/2008  . Hypertension 10/12/2008  . Chronic kidney disease   . Cancer     rt breast  . CHF (congestive heart failure)   . Malignant neoplasm of breast (female), unspecified site 10/05/2010  . Gastroparesis 10/05/2010  . Diaphragmatic hernia without mention of obstruction or gangrene 08/29/2010  . Hypotension, unspecified 05/20/2009  . Edema 05/20/2009  . Rheumatoid arthritis 01/17/2009  . Atherosclerosis of native arteries of the extremities, unspecified 11/06/2008  . Iron deficiency anemia, unspecified 10/12/2008   Outpatient Encounter Prescriptions as of 12/08/2013  Medication Sig  . baclofen (LIORESAL) 10 MG tablet Take 10 mg by mouth 2 (two) times daily.   . bisacodyl (DULCOLAX) 10 MG suppository Place 10 mg rectally every  other day.   . calcium-vitamin D (OYSTER CALCIUM 500 + D) 500-200 MG-UNIT per tablet Take 1 tablet by mouth 2 (two) times daily.   . carvedilol (COREG) 6.25 MG tablet Take 6.25 mg by mouth 2 (two) times daily with a meal.  . cetirizine (ZYRTEC) 10 MG tablet Take 10 mg by mouth daily.  Marland Kitchen escitalopram (LEXAPRO) 20 MG tablet Take 15 mg by mouth daily.   Marland Kitchen esomeprazole (NEXIUM) 40 MG capsule Take 40 mg by mouth daily before breakfast.  . furosemide (LASIX) 20 MG tablet Take 20 mg by mouth daily.  Marland Kitchen HYDROcodone-acetaminophen (NORCO) 7.5-325 MG per tablet Take one tablet by mouth every 4 hours as needed for pain  . hydroxychloroquine (PLAQUENIL) 200 MG tablet Take by mouth daily.  . Linaclotide (LINZESS) 145 MCG CAPS capsule Take 145 mcg by mouth daily.  . Menthol, Topical Analgesic, (BIOFREEZE EX) Apply topically daily.  . metoCLOPramide (REGLAN) 5 MG tablet Take 5 mg by mouth 3 (three) times daily.  . Multiple Vitamin (MULTIVITAMIN) capsule Take 1 capsule by mouth daily.  . traZODone (DESYREL) 50 MG tablet Take 75 mg by mouth at bedtime.   . [DISCONTINUED] diphenhydrAMINE (SOMINEX) 25 MG tablet Take 25 mg by mouth every 8 (eight) hours as needed for sleep.  . [DISCONTINUED] hydrOXYzine (ATARAX/VISTARIL) 25 MG tablet Take 25 mg by mouth every 8 (eight) hours as needed for itching.  . [DISCONTINUED] polyethylene glycol (MIRALAX / GLYCOLAX) packet Take 17 g by mouth daily as needed.   Physical exam BP 132/70  Pulse 86  Temp(Src) 98.2 F (36.8 C)  Resp 18  Ht 5\' 5"  (1.651 m)  Wt 167 lb (75.751 kg)  BMI 27.79 kg/m2  SpO2 96%  Constitutional: She is oriented to person, place, and time. She appears well-developed and well-nourished. overweight Eyes: PERRLA, EOMI Throat: mild oropharynx erythema, no exudates   Neck: Neck supple. No JVD present. No cervical adneopathy Cardiovascular: Normal rate, regular rhythm and intact distal pulses.    Respiratory: Effort normal and breath sounds normal. No  respiratory distress. She has no wheezes.   GI: Soft. Bowel sounds are normal. She exhibits no distension. There is some suprapubic tenderness.  Musculoskeletal: She exhibits no edema. Has no movement in lower extremities, has full range of motion in upper extremities. has lower leg contractures Neurological: She is alert and oriented to person, place, and time.   Skin: Skin is warm and dry.  Psychiatric: She has a normal mood and affect.   LABS REVIEWED:   02-24-13: wbc 4.9; hgb 9.2; hct 30.1; mcv 63.8; plt 255; glucose 104; bun 10; creat 0.60; k+3.9; na++140; liver normal albumin 3.4 03-13-13: urine culture: e-coli: septra ds.   08-19-13 urine culture e.coli 08-23-13 wbc 5.7, hb 9.5, hct 30.2, plt 259, na 139, k 3.5, bun 7, cr 0.68, glu 98, ca 8.9  ASSESSMENT/ PLAN:  Reactive airway disease With persistent cough with clear phlegm. Zyrtec is not helping her. Will change it to prn only. Will add proventil MDI q8h for a week and then prn and reassess. Is on prn guiatuss  GERD Stable. Decrease nexium to 20mg  daily and monitor symptoms  constipation Mostly bed bound, also on opioids for pain. linzess has been helpful. Monitor  HTN   continue coreg 6.25 mg twice daily and lasix 20 mg daily and will monitor her bp readings.   RA Continue plaquenil for now. Check cbc and folic acid level. Continue ca-vit d. Add folic acid 1 mg daily  Labs- cbc  Blanchie Serve, MD  [facility] Adult [facility] [phone] (Monday-Friday 8 am - 5 pm) [phone] (afterhours)

## 2014-01-04 ENCOUNTER — Non-Acute Institutional Stay (SKILLED_NURSING_FACILITY): Payer: PRIVATE HEALTH INSURANCE | Admitting: Internal Medicine

## 2014-01-04 DIAGNOSIS — K5900 Constipation, unspecified: Secondary | ICD-10-CM

## 2014-01-04 DIAGNOSIS — G4700 Insomnia, unspecified: Secondary | ICD-10-CM

## 2014-01-04 DIAGNOSIS — I10 Essential (primary) hypertension: Secondary | ICD-10-CM

## 2014-01-04 DIAGNOSIS — D509 Iron deficiency anemia, unspecified: Secondary | ICD-10-CM

## 2014-01-04 DIAGNOSIS — G8220 Paraplegia, unspecified: Secondary | ICD-10-CM

## 2014-01-04 NOTE — Progress Notes (Signed)
Patient ID: [name], female   DOB: [name] 31, 1951, 63 y.o.   MRN: [id number]    Facility: [facility]  Chief Complaint  Patient presents with  . Medical Management of Chronic Issues    RV   No Known Allergies  Code- full code  HPI 63 y/o female patient is seen for routine follow up visit. She has been able to sleep at night well recently. She complaints about lack of complete bowel emptying. No other concerns. Leg pain is under control.   Review of Systems   Constitutional: Negative for fever and malaise/fatigue.   Respiratory: Negative for shortness of breath.    Cardiovascular: Negative for chest pain, palpitations and leg swelling.   Gastrointestinal: Negative for heartburn, abdominal pain   Genitourinary: has neurogenic bladder Musculoskeletal: Negative for myalgias and joint pain.   Skin: Negative.    Neurological: Negative for headaches.   Psychiatric/Behavioral: Negative for depression. The patient does not have insomnia    Past Medical History  Diagnosis Date  . Immobility syndrome (paraplegic)   . GERD (gastroesophageal reflux disease) 02/02/2009  . Leiomyoma of uterus   . Anemia 10/12/2008  . Hypertension 10/12/2008  . Chronic kidney disease   . Cancer     rt breast  . CHF (congestive heart failure)   . Malignant neoplasm of breast (female), unspecified site 10/05/2010  . Gastroparesis 10/05/2010  . Diaphragmatic hernia without mention of obstruction or gangrene 08/29/2010  . Hypotension, unspecified 05/20/2009  . Edema 05/20/2009  . Rheumatoid arthritis 01/17/2009  . Atherosclerosis of native arteries of the extremities, unspecified 11/06/2008  . Iron deficiency anemia, unspecified 10/12/2008   Medication reviewed. See [facility]  Physical exam Vital signs stable. afebrile  Constitutional: She is oriented to person, place, and time. She appears well-developed and well-nourished. Neck: Neck supple. No JVD present. No cervical  adneopathy Cardiovascular: Normal rate, regular rhythm and intact distal pulses.    Respiratory: Effort normal and breath sounds normal. No respiratory distress. She has no wheezes.   GI: Soft. Bowel sounds are normal. She exhibits no distension.  Musculoskeletal: She exhibits no edema. Has no movement in lower extremities, has full range of motion in upper extremities.   Neurological: She is alert and oriented to person, place, and time.   Skin: Skin is warm and dry.  Psychiatric: She has a normal mood and affect.   LABS REVIEWED:   02-24-13: wbc 4.9; hgb 9.2; hct 30.1; mcv 63.8; plt 255; glucose 104; bun 10; creat 0.60; k+3.9; na++140; liver normal albumin 3.4 03-13-13: urine culture: e-coli: septra ds.   08-19-13 urine culture e.coli 08-23-13 wbc 5.7, hb 9.5, hct 30.2, plt 259, na 139, k 3.5, bun 7, cr 0.68, glu 98, ca 8.9  ASSESSMENT/ PLAN:  Anemia With low MCV, will start her on ferrous sulfate 325 mg bid for now. Check cbc and ferritin level next lab draw  Constipation Continue linzess for now. Add fiber supplement and encouraged hydration for now  Paraplegia Stable currently. Tolerating baclofen 10 mg bid and monitor   HTN   continue coreg 6.25 mg twice daily and lasix 20 mg daily and will monitor her bp readings.   Insomnia Improved. Decrease trazodone to 50 mg qhs for now  Blanchie Serve, MD  [facility] Adult [facility] [phone] (Monday-Friday 8 am - 5 pm) [phone] (afterhours)

## 2014-01-05 DIAGNOSIS — I10 Essential (primary) hypertension: Secondary | ICD-10-CM | POA: Insufficient documentation

## 2014-01-05 DIAGNOSIS — D509 Iron deficiency anemia, unspecified: Secondary | ICD-10-CM | POA: Insufficient documentation

## 2014-01-05 LAB — BASIC METABOLIC PANEL
BUN: 9 mg/dL (ref 4–21)
Creatinine: 0.6 mg/dL (ref <=1.1)
GLUCOSE: 97 mg/dL
Potassium: 4 mmol/L (ref 3.4–5.3)
Sodium: 140 mmol/L (ref 137–147)

## 2014-01-05 LAB — CBC AND DIFFERENTIAL
HEMATOCRIT: 30 % — AB (ref 36–46)
Hemoglobin: 9.3 g/dL — AB (ref 12.0–16.0)
Platelets: 222 10*3/uL (ref 150–399)
WBC: 4.8 10^3/mL

## 2014-02-06 ENCOUNTER — Other Ambulatory Visit: Payer: Self-pay | Admitting: *Deleted

## 2014-02-06 MED ORDER — HYDROCODONE-ACETAMINOPHEN 7.5-325 MG PO TABS: ORAL_TABLET | ORAL | Status: DC

## 2014-02-06 NOTE — Telephone Encounter (Signed)
[facility] 

## 2014-03-09 ENCOUNTER — Non-Acute Institutional Stay (SKILLED_NURSING_FACILITY): Payer: PRIVATE HEALTH INSURANCE | Admitting: Internal Medicine

## 2014-03-09 ENCOUNTER — Encounter: Payer: Self-pay | Admitting: Internal Medicine

## 2014-03-09 DIAGNOSIS — D509 Iron deficiency anemia, unspecified: Secondary | ICD-10-CM

## 2014-03-09 DIAGNOSIS — L989 Disorder of the skin and subcutaneous tissue, unspecified: Secondary | ICD-10-CM

## 2014-03-09 NOTE — Progress Notes (Signed)
Patient ID: [name], female   DOB: [birth year], 63 y.o.   MRN: [id number]  Location: [facility] Provider:  Blanchie Serve MD  Code Status:  Full  Chief Complaint  Patient presents with  . Acute Visit    bump on left side of chest, low ferritin in lab from 6/15    HPI:  63 y/o female patient is seen today for acute concerns. She has noticed a bump on left side of the chest for few days and is concerned about it No bumps/ lesions elsewhere Staff wants labs from 6/15 reviewed . On review h/h 9.3/30.2 with mcv 63.2 and ferritin 4  ROS No fever or chills No drainage from the bump Denies chest pain Denies dyspnea Denies melena, rectal bleed, hematemesis Denies heart burn or epigastric pain Denies nausea or vomiting No abdominal pain Denies dizziness  Medications: Patient's Medications  New Prescriptions   No medications on file  Previous Medications   ALBUTEROL (PROVENTIL HFA) 108 (90 BASE) MCG/ACT INHALER    Inhale 1 puff into the lungs every 8 (eight) hours as needed for wheezing or shortness of breath.   BACLOFEN (LIORESAL) 10 MG TABLET    Take 10 mg by mouth 2 (two) times daily.    BISACODYL (DULCOLAX) 10 MG SUPPOSITORY    Place 10 mg rectally every other day.    CALCIUM-VITAMIN D (OYSTER CALCIUM 500 + D) 500-200 MG-UNIT PER TABLET    Take 1 tablet by mouth 2 (two) times daily.    CARVEDILOL (COREG) 6.25 MG TABLET    Take 6.25 mg by mouth 2 (two) times daily with a meal.   CETIRIZINE (ZYRTEC) 10 MG TABLET    Take 10 mg by mouth daily.   CRANBERRY 450 MG TABS    Take 1 tablet by mouth daily. For UTI   DIPHENHYDRAMINE (SOMINEX) 25 MG TABLET    Take 25 mg by mouth at bedtime as needed for sleep.   ESCITALOPRAM (LEXAPRO) 10 MG TABLET    Give 15 mg= 1 1/2 tabs by mouth daily .   ESOMEPRAZOLE (NEXIUM) 40 MG CAPSULE    Take 40 mg by mouth daily before breakfast.   FOLIC ACID 0.8 MG CAPS    Take 1 capsule by mouth daily. For plaquenil use   FUROSEMIDE (LASIX)  20 MG TABLET    Take 20 mg by mouth daily.   GUAIFENESIN (ROBITUSSIN) 100 MG/5ML SYRUP    Take 100 mg by mouth 3 (three) times daily as needed for cough.   HYDROXYCHLOROQUINE (PLAQUENIL) 200 MG TABLET    Take by mouth daily.   LINACLOTIDE (LINZESS) 145 MCG CAPS CAPSULE    Take 145 mcg by mouth daily.   MENTHOL, TOPICAL ANALGESIC, (BIOFREEZE EX)    Apply topically daily.   METOCLOPRAMIDE (REGLAN) 5 MG TABLET    Take 5 mg by mouth 3 (three) times daily.   MULTIPLE VITAMIN (MULTIVITAMIN) CAPSULE    Take 1 capsule by mouth daily.   TRAZODONE (DESYREL) 50 MG TABLET    Take 75 mg by mouth at bedtime.   Modified Medications   Modified Medication Previous Medication   HYDROCODONE-ACETAMINOPHEN (NORCO) 7.5-325 MG PER TABLET HYDROcodone-acetaminophen (NORCO) 7.5-325 MG per tablet      Take one tablet by mouth every 4 hours as needed for pain    Take one tablet by mouth every 4 hours as needed for pain  Discontinued Medications   ESCITALOPRAM (LEXAPRO) 20 MG TABLET    Take 15 mg  by mouth daily.     Physical Exam: Filed Vitals:   03/09/14 1048  BP: 122/60  Pulse: 70  Temp: 97.8 F (36.6 C)  Resp: 16  Height: 5\' 5"  (1.651 m)  Weight: 166 lb (75.297 kg)  SpO2: 96%   gen- elderly female in NAD Eyes- no pallor or icterus CVS- normal s1,s2, rrr respi-CTAB Skin- flat pea size brown hyperpigmented area on right chest area, no erythema, non tender, no drainage Breasts- b/l mastectomy, no axillary lymphadenopathy abdomen- bowel sounds present, soft, non tender  Labs reviewed: Basic Metabolic Panel:  Recent Labs  01/05/14  NA 140  K 4.0  BUN 9  CREATININE 0.6    CBC:  Recent Labs  01/05/14  WBC 4.8  HGB 9.3*  HCT 30*  PLT 222    Assessment/Plan  Skin lesion New hyperpigmented lesion on chest area. Pt not fully aware for how long it has been there. Caused some discomfort to her but none at present. No tenderness on exam. Will monitor this clinically and if has change in size or  shape will consider dermatology referral for possible biopsy  Iron def anemia Change ferrous sulfate to 325 mg po tid for now and recheck cbc and ferritin in 6 weeks

## 2014-03-16 ENCOUNTER — Other Ambulatory Visit: Payer: Self-pay | Admitting: *Deleted

## 2014-03-16 MED ORDER — HYDROCODONE-ACETAMINOPHEN 7.5-325 MG PO TABS: ORAL_TABLET | ORAL | Status: DC

## 2014-03-16 NOTE — Telephone Encounter (Signed)
[facility] 

## 2014-04-20 ENCOUNTER — Other Ambulatory Visit: Payer: Self-pay | Admitting: Internal Medicine

## 2014-04-20 LAB — FERRITIN: Ferritin: 120 ng/mL (ref 10–291)

## 2014-04-20 LAB — CBC WITH DIFFERENTIAL/PLATELET
Basophils Absolute: 0 10*3/uL (ref 0.0–0.1)
Basophils Relative: 0 % (ref 0–1)
Eosinophils Absolute: 0.1 10*3/uL (ref 0.0–0.7)
Eosinophils Relative: 2 % (ref 0–5)
HCT: 42.1 % (ref 36.0–46.0)
Hemoglobin: 14.5 g/dL (ref 12.0–15.0)
Lymphocytes Relative: 44 % (ref 12–46)
Lymphs Abs: 2.6 10*3/uL (ref 0.7–4.0)
MCH: 26.4 pg (ref 26.0–34.0)
MCHC: 34.4 g/dL (ref 30.0–36.0)
MCV: 76.7 fL — ABNORMAL LOW (ref 78.0–100.0)
Monocytes Absolute: 0.4 10*3/uL (ref 0.1–1.0)
Monocytes Relative: 7 % (ref 3–12)
Neutro Abs: 2.7 10*3/uL (ref 1.7–7.7)
Neutrophils Relative %: 47 % (ref 43–77)
Platelets: 154 10*3/uL (ref 150–400)
RBC: 5.49 MIL/uL — ABNORMAL HIGH (ref 3.87–5.11)
RDW: 15.9 % — ABNORMAL HIGH (ref 11.5–15.5)
WBC: 5.8 10*3/uL (ref 4.0–10.5)

## 2014-04-23 LAB — ERYTHROPOIETIN: Erythropoietin: 12 m[IU]/mL (ref 2.6–18.5)

## 2014-05-07 ENCOUNTER — Other Ambulatory Visit: Payer: Self-pay | Admitting: *Deleted

## 2014-05-07 MED ORDER — HYDROCODONE-ACETAMINOPHEN 7.5-325 MG PO TABS: ORAL_TABLET | ORAL | Status: DC

## 2014-05-07 NOTE — Telephone Encounter (Signed)
[facility] 

## 2014-05-08 ENCOUNTER — Encounter: Payer: Self-pay | Admitting: Internal Medicine

## 2014-05-08 ENCOUNTER — Non-Acute Institutional Stay (SKILLED_NURSING_FACILITY): Payer: PRIVATE HEALTH INSURANCE | Admitting: Internal Medicine

## 2014-05-08 DIAGNOSIS — G8220 Paraplegia, unspecified: Secondary | ICD-10-CM

## 2014-05-08 DIAGNOSIS — L539 Erythematous condition, unspecified: Secondary | ICD-10-CM

## 2014-05-08 DIAGNOSIS — D509 Iron deficiency anemia, unspecified: Secondary | ICD-10-CM

## 2014-05-08 DIAGNOSIS — I10 Essential (primary) hypertension: Secondary | ICD-10-CM

## 2014-05-08 DIAGNOSIS — M069 Rheumatoid arthritis, unspecified: Secondary | ICD-10-CM

## 2014-05-08 DIAGNOSIS — K219 Gastro-esophageal reflux disease without esophagitis: Secondary | ICD-10-CM
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
## 2014-07-04 ENCOUNTER — Non-Acute Institutional Stay (SKILLED_NURSING_FACILITY): Payer: PRIVATE HEALTH INSURANCE | Admitting: Adult Health

## 2014-07-04 DIAGNOSIS — K5909 Other constipation: Secondary | ICD-10-CM

## 2014-07-04 DIAGNOSIS — F329 Major depressive disorder, single episode, unspecified: Secondary | ICD-10-CM

## 2014-07-04 DIAGNOSIS — G8220 Paraplegia, unspecified: Secondary | ICD-10-CM

## 2014-07-04 DIAGNOSIS — K3184 Gastroparesis: Secondary | ICD-10-CM

## 2014-07-04 DIAGNOSIS — I509 Heart failure, unspecified: Secondary | ICD-10-CM

## 2014-07-04 DIAGNOSIS — F32A Depression, unspecified: Secondary | ICD-10-CM

## 2014-07-04 DIAGNOSIS — I10 Essential (primary) hypertension: Secondary | ICD-10-CM

## 2014-07-04 DIAGNOSIS — M069 Rheumatoid arthritis, unspecified: Secondary | ICD-10-CM

## 2014-07-04 DIAGNOSIS — D509 Iron deficiency anemia, unspecified: Secondary | ICD-10-CM

## 2014-07-05 ENCOUNTER — Encounter: Payer: Self-pay | Admitting: Adult Health

## 2014-07-05 ENCOUNTER — Non-Acute Institutional Stay (SKILLED_NURSING_FACILITY): Payer: PRIVATE HEALTH INSURANCE | Admitting: Adult Health

## 2014-07-05 DIAGNOSIS — K3184 Gastroparesis: Secondary | ICD-10-CM | POA: Insufficient documentation

## 2014-07-05 DIAGNOSIS — R748 Abnormal levels of other serum enzymes: Secondary | ICD-10-CM

## 2014-07-05 DIAGNOSIS — I509 Heart failure, unspecified: Secondary | ICD-10-CM | POA: Insufficient documentation

## 2014-07-05 DIAGNOSIS — K5909 Other constipation: Secondary | ICD-10-CM

## 2014-07-05 DIAGNOSIS — K560 Paralytic ileus: Secondary | ICD-10-CM

## 2014-07-05 NOTE — Progress Notes (Signed)
Patient ID: [name], female   DOB: 1951-07-07, 63 y.o.   MRN: [id number]  [name] living     No Known Allergies     Chief Complaint  Patient presents with  . Medical Management of Chronic Issues    HPI:  She is a long term resident of this facility being seen for the management of her chronic illnesses. Overall her status has remained stable over the past year. She has been complaining of abdominal pain in the right side which is worse with deep breathing and movement.    Past Medical History  Diagnosis Date  . Immobility syndrome (paraplegic)   . GERD (gastroesophageal reflux disease) 02/02/2009  . Leiomyoma of uterus   . Anemia 10/12/2008  . Hypertension 10/12/2008  . Chronic kidney disease   . Cancer     rt breast  . CHF (congestive heart failure)   . Malignant neoplasm of breast (female), unspecified site 10/05/2010  . Gastroparesis 10/05/2010  . Diaphragmatic hernia without mention of obstruction or gangrene 08/29/2010  . Hypotension, unspecified 05/20/2009  . Edema 05/20/2009  . Rheumatoid arthritis 01/17/2009  . Atherosclerosis of native arteries of the extremities, unspecified 11/06/2008  . Iron deficiency anemia, unspecified 10/12/2008    Past Surgical History  Procedure Laterality Date  . Breast surgery  2003    Right mastectomy  . Wound debridement  09/02/2008    Large sacral back open wound  . Pressure ulcer debridement  2009    on back    VITAL SIGNS BP 127/72 mmHg  Pulse 87  Ht 5' 5"  (1.651 m)  Wt 167 lb (75.751 kg)  BMI 27.79 kg/m2  SpO2 96%   Outpatient Encounter Prescriptions as of 07/04/2014  Medication Sig  . albuterol (PROVENTIL HFA) 108 (90 BASE) MCG/ACT inhaler Inhale 1 puff into the lungs every 8 (eight) hours as needed for wheezing or shortness of breath.  . baclofen (LIORESAL) 10 MG tablet Take 10 mg by mouth 2 (two) times daily.   . bisacodyl (DULCOLAX) 10 MG suppository Place 10 mg rectally every other day.   .  calcium-vitamin D (OYSTER CALCIUM 500 + D) 500-200 MG-UNIT per tablet Take 1 tablet by mouth 2 (two) times daily.   . carvedilol (COREG) 6.25 MG tablet Take 6.25 mg by mouth 2 (two) times daily with a meal.  . cetirizine (ZYRTEC) 10 MG tablet Take 10 mg by mouth daily.  . Cranberry 450 MG TABS Take 1 tablet by mouth daily. For UTI  . diphenhydrAMINE (SOMINEX) 25 MG tablet Take 25 mg by mouth at bedtime as needed for sleep.  Marland Kitchen escitalopram (LEXAPRO) 10 MG tablet Give 15 mg= 1 1/2 tabs by mouth daily .  . esomeprazole (NEXIUM) 40 MG capsule Take 40 mg by mouth daily before breakfast.  . furosemide (LASIX) 20 MG tablet Take 20 mg by mouth daily.  Marland Kitchen guaifenesin (ROBITUSSIN) 100 MG/5ML syrup Take 100 mg by mouth 3 (three) times daily as needed for cough.  Marland Kitchen HYDROcodone-acetaminophen (NORCO) 7.5-325 MG per tablet Take one tablet by mouth every 4 hours as needed for pain  . hydroxychloroquine (PLAQUENIL) 200 MG tablet Take by mouth daily.  . Linaclotide (LINZESS) 145 MCG CAPS capsule Take 145 mcg by mouth daily.  . Menthol, Topical Analgesic, (BIOFREEZE EX) Apply topically daily.  . metoCLOPramide (REGLAN) 5 MG tablet Take 5 mg by mouth 3 (three) times daily.  . Multiple Vitamin (MULTIVITAMIN) capsule Take 1 capsule by mouth daily.  . Prenatal Multivit-Min-Fe-FA (PRENATAL/IRON) TABS  Take 1 tablet by mouth 2 (two) times daily.  . traZODone (DESYREL) 50 MG tablet Take 75 mg by mouth at bedtime.   . [DISCONTINUED] ferrous sulfate 325 (65 FE) MG tablet Take 325 mg by mouth daily with breakfast.  .  Folic Acid 0.8 MG CAPS Take 1 capsule by mouth daily. For plaquenil use     SIGNIFICANT DIAGNOSTIC EXAMS  LABS REVIEWED:   04-20-14: wbc 5.8; hgb 14.5; hct 42.1 ;mcv 76 ;plkt 154; ferritin 120; EPO 12.0     Review of Systems  Constitutional: Negative for malaise/fatigue.  Respiratory: Negative for cough and shortness of breath.   Cardiovascular: Negative for chest pain, palpitations and leg swelling.    Gastrointestinal: Positive for abdominal pain. Negative for heartburn, nausea and vomiting.       Has abdominal pain right side worse with movement and with deep breaths   Musculoskeletal: Negative for myalgias and joint pain.  Skin: Negative.   Psychiatric/Behavioral: Negative for depression. The patient is not nervous/anxious.      Physical Exam  Constitutional: She is oriented to person, place, and time. She appears well-developed and well-nourished. No distress.  Neck: Neck supple. No JVD present. No thyromegaly present.  Cardiovascular: Normal rate, regular rhythm and intact distal pulses.   Respiratory: Effort normal and breath sounds normal. No respiratory distress. She has no wheezes.  GI: Bowel sounds are normal. She exhibits distension. There is no tenderness.  Abdomen is firm and distended bowel sounds slightly hypoactive   Musculoskeletal: She exhibits no edema.  Able to move upper extremities; is paraplegic   Neurological: She is alert and oriented to person, place, and time.  Skin: Skin is warm and dry. She is not diaphoretic.  Psychiatric: She has a normal mood and affect.       ASSESSMENT/ PLAN:  1. CHF: is stable will continue lasix 20 mg dialy; will monitor   2. Hypertension: is stable will continue coreg 6.25 mg twice daily; will monitor   3. Gastroparesis: is stable will continue nexium 40 mg daily and reglan 5 mg three times daily with meals will monitor   4. RA: is stable will continue plaquenil 200 mg daily with folic acid daily has vicodin 7.5/325 mg every 4 hours as needed for pain and will monitor   5. Paraplegia: without change: no complaints of pain present; takes baclofen 10 mg twice daily for spasticity will monitor   6. Anemia: will continue prenatal iron twice daily   7. Allergic rhinitis: will continue zyrtec 10 mg daily   8. Depression: she is emotionally stable will continue lexapro 15 mg daily   9. Constipation: will continue linzess  145 mcg daily; will continue dulcoalx supp every other day and will monitor   Will check cbc; cmp; ua/c&s; will get a kub and will treat as indicated.   Time spent with patient 50 minutes.    Ok Edwards NP [facility]  Contact [phone] Monday through Friday 8am- 5pm  After hours call [phone]

## 2014-07-21 ENCOUNTER — Encounter: Payer: Self-pay | Admitting: Adult Health

## 2014-07-21 NOTE — Progress Notes (Signed)
Patient ID: [name], female   DOB: 1950-08-02, 63 y.o.   MRN: [id number]  [name]     No Known Allergies     Chief Complaint  Patient presents with  . Acute Visit    follow up results     HPI:  Her kub demonstrates a modest ileus with a calcification in her right pelvic region. Her liver enzymes are slightly elevated. She continues to have a distended abdomen and has pain. We discusses the results. We will treat her ileus today and will setup an abdominal ultrasound.   Past Medical History  Diagnosis Date  . Immobility syndrome (paraplegic)   . GERD (gastroesophageal reflux disease) 02/02/2009  . Leiomyoma of uterus   . Anemia 10/12/2008  . Hypertension 10/12/2008  . Chronic kidney disease   . Cancer     rt breast  . CHF (congestive heart failure)   . Malignant neoplasm of breast (female), unspecified site 10/05/2010  . Gastroparesis 10/05/2010  . Diaphragmatic hernia without mention of obstruction or gangrene 08/29/2010  . Hypotension, unspecified 05/20/2009  . Edema 05/20/2009  . Rheumatoid arthritis 01/17/2009  . Atherosclerosis of native arteries of the extremities, unspecified 11/06/2008  . Iron deficiency anemia, unspecified 10/12/2008    Past Surgical History  Procedure Laterality Date  . Breast surgery  2003    Right mastectomy  . Wound debridement  09/02/2008    Large sacral back open wound  . Pressure ulcer debridement  2009    on back    VITAL SIGNS BP 126/72 mmHg  Pulse 67  Ht 5' 5"  (1.651 m)  Wt 167 lb (75.751 kg)  BMI 27.79 kg/m2   Outpatient Encounter Prescriptions as of 07/05/2014  Medication Sig  . albuterol (PROVENTIL HFA) 108 (90 BASE) MCG/ACT inhaler Inhale 1 puff into the lungs every 8 (eight) hours as needed for wheezing or shortness of breath.  . baclofen (LIORESAL) 10 MG tablet Take 10 mg by mouth 2 (two) times daily.   . bisacodyl (DULCOLAX) 10 MG suppository Place 10 mg rectally every other day.   . calcium-vitamin  D (OYSTER CALCIUM 500 + D) 500-200 MG-UNIT per tablet Take 1 tablet by mouth 2 (two) times daily.   . carvedilol (COREG) 6.25 MG tablet Take 6.25 mg by mouth 2 (two) times daily with a meal.  . cetirizine (ZYRTEC) 10 MG tablet Take 10 mg by mouth daily.  . Cranberry 450 MG TABS Take 1 tablet by mouth daily. For UTI  . diphenhydrAMINE (SOMINEX) 25 MG tablet Take 25 mg by mouth at bedtime as needed for sleep.  Marland Kitchen escitalopram (LEXAPRO) 10 MG tablet Give 15 mg= 1 1/2 tabs by mouth daily .  . esomeprazole (NEXIUM) 40 MG capsule Take 40 mg by mouth daily before breakfast.  . furosemide (LASIX) 20 MG tablet Take 20 mg by mouth daily.  Marland Kitchen guaifenesin (ROBITUSSIN) 100 MG/5ML syrup Take 100 mg by mouth 3 (three) times daily as needed for cough.  Marland Kitchen HYDROcodone-acetaminophen (NORCO) 7.5-325 MG per tablet Take one tablet by mouth every 4 hours as needed for pain  . hydroxychloroquine (PLAQUENIL) 200 MG tablet Take by mouth daily.  . Linaclotide (LINZESS) 145 MCG CAPS capsule Take 145 mcg by mouth daily.  . Menthol, Topical Analgesic, (BIOFREEZE EX) Apply topically daily.  . metoCLOPramide (REGLAN) 5 MG tablet Take 5 mg by mouth 3 (three) times daily.  . Multiple Vitamin (MULTIVITAMIN) capsule Take 1 capsule by mouth daily.  . Prenatal Multivit-Min-Fe-FA (PRENATAL/IRON) TABS Take  1 tablet by mouth 2 (two) times daily.  . traZODone (DESYREL) 50 MG tablet Take 75 mg by mouth at bedtime.      SIGNIFICANT DIAGNOSTIC EXAMS  07-04-14: kub: apparent large calcification right pelvic region modest diffuse ileus    LABS REVIEWED:   04-20-14: wbc 5.8; hgb 14.5; hct 42.1 ;mcv 76 ;plkt 154; ferritin 120; EPO 12.0  07-05-14: wbc 5.5; hgb 14.0; hct 43.0; mcv 87.8; plt 135; glucose 154; bun 7; creat 0.7; k+3.9; na++147; alk phos 96; ast 64 alt 101; t bili 0.3; albumin 3.5        ROS  Constitutional: Negative for malaise/fatigue.  Respiratory: Negative for cough and shortness of breath.   Cardiovascular: Negative  for chest pain, palpitations and leg swelling.  Gastrointestinal: Positive for abdominal pain. Negative for heartburn, nausea and vomiting.       Has abdominal pain right side worse with movement and with deep breaths   Musculoskeletal: Negative for myalgias and joint pain.  Skin: Negative.   Psychiatric/Behavioral: Negative for depression. The patient is not nervous/anxious.     Physical Exam  Constitutional: She is oriented to person, place, and time. She appears well-developed and well-nourished. No distress.  Neck: Neck supple. No JVD present. No thyromegaly present.  Cardiovascular: Normal rate, regular rhythm and intact distal pulses.   Respiratory: Effort normal and breath sounds normal. No respiratory distress. She has no wheezes.  GI: Bowel sounds are normal. She exhibits distension. There is no tenderness.  Abdomen is firm and distended bowel sounds slightly hypoactive   Musculoskeletal: She exhibits no edema.  Able to move upper extremities; is paraplegic   Neurological: She is alert and oriented to person, place, and time.  Skin: Skin is warm and dry. She is not diaphoretic.  Psychiatric: She has a normal mood and affect   ASSESSMENT/ PLAN:  1. Abdominal pain with ileus and elevated liver enzymes: will give her a fleets enema today and will increase her linzess to 290 mcg daily will setup a liver ultrasound and will monitor    Ok Edwards NP [facility]  Contact [phone] Monday through Friday 8am- 5pm  After hours call [phone]

## 2014-07-22 DIAGNOSIS — K560 Paralytic ileus: Secondary | ICD-10-CM | POA: Insufficient documentation

## 2014-07-22 DIAGNOSIS — R748 Abnormal levels of other serum enzymes: Secondary | ICD-10-CM | POA: Insufficient documentation

## 2014-07-22 MED ORDER — LINACLOTIDE 290 MCG PO CAPS: 290.0000 ug | ORAL_CAPSULE | Freq: Every day | ORAL | Status: DC

## 2014-08-21 ENCOUNTER — Encounter: Payer: Self-pay | Admitting: Internal Medicine

## 2014-08-21 ENCOUNTER — Non-Acute Institutional Stay (SKILLED_NURSING_FACILITY): Payer: Medicare Other | Admitting: Internal Medicine

## 2014-08-21 DIAGNOSIS — D509 Iron deficiency anemia, unspecified: Secondary | ICD-10-CM

## 2014-08-21 DIAGNOSIS — Z853 Personal history of malignant neoplasm of breast: Secondary | ICD-10-CM

## 2014-08-21 DIAGNOSIS — I509 Heart failure, unspecified: Secondary | ICD-10-CM

## 2014-08-21 DIAGNOSIS — I10 Essential (primary) hypertension: Secondary | ICD-10-CM

## 2014-08-21 DIAGNOSIS — M069 Rheumatoid arthritis, unspecified: Secondary | ICD-10-CM

## 2014-08-21 DIAGNOSIS — G8220 Paraplegia, unspecified: Secondary | ICD-10-CM

## 2014-08-21 NOTE — Progress Notes (Signed)
Patient ID: [name], female   DOB: Oct 22, 1950, 64 y.o.   MRN: [id number]    [place] of Service: SNF (31)    No Known Allergies  Chief Complaint  Patient presents with  . Medical Management of Chronic Issues    HPI:  64 yo female long term resident seen today for above. She saw H/O last in Oct 2014 but missed her 6 mo f/u. At the time, she was stable from breast CA standpoint. Last month, she saw the dentist and had an eye exam in Oct 2015. Last labs done in Dec 2015 was stable. She is c/a abdominal US report showing enlarged liver. She has not seen cardiology Dr Jimmie Molly at [facility] in a while. Her BMs are now nml since her enema. She does have an intermittent rash on b/l forearm that does not itch. No insect bites. She has no other concerns. No nursing issues.  Pt denies any CP, SOB, palpittaions, HA or dizziness. She ambulates with motorized w/c due to paraplegia. No f/c. No urinary issues  CODE STATUS: Full   Medications: Patient's Medications  New Prescriptions   No medications on file  Previous Medications   ALBUTEROL (PROVENTIL HFA) 108 (90 BASE) MCG/ACT INHALER    Inhale 1 puff into the lungs every 8 (eight) hours as needed for wheezing or shortness of breath.   BACLOFEN (LIORESAL) 10 MG TABLET    Take 10 mg by mouth 2 (two) times daily.    BISACODYL (DULCOLAX) 10 MG SUPPOSITORY    Place 10 mg rectally every other day.    CALCIUM-VITAMIN D (OYSTER CALCIUM 500 + D) 500-200 MG-UNIT PER TABLET    Take 1 tablet by mouth 2 (two) times daily.    CARVEDILOL (COREG) 6.25 MG TABLET    Take 6.25 mg by mouth 2 (two) times daily with a meal.   CETIRIZINE (ZYRTEC) 10 MG TABLET    Take 10 mg by mouth daily.   CRANBERRY 450 MG TABS    Take 1 tablet by mouth daily. For UTI   DIPHENHYDRAMINE (SOMINEX) 25 MG TABLET    Take 25 mg by mouth at bedtime as needed for sleep.   ESCITALOPRAM (LEXAPRO) 10 MG TABLET    Give 15 mg= 1 1/2  tabs by mouth daily .   ESOMEPRAZOLE (NEXIUM) 40 MG CAPSULE    Take 40 mg by mouth daily before breakfast.   FUROSEMIDE (LASIX) 20 MG TABLET    Take 20 mg by mouth daily.   GUAIFENESIN (ROBITUSSIN) 100 MG/5ML SYRUP    Take 100 mg by mouth 3 (three) times daily as needed for cough.   HYDROCODONE-ACETAMINOPHEN (NORCO) 7.5-325 MG PER TABLET    Take one tablet by mouth every 4 hours as needed for pain   HYDROXYCHLOROQUINE (PLAQUENIL) 200 MG TABLET    Take by mouth daily.   LINACLOTIDE (LINZESS) 290 MCG CAPS CAPSULE    Take 1 capsule (290 mcg total) by mouth daily.   MENTHOL, TOPICAL ANALGESIC, (BIOFREEZE EX)    Apply topically daily.   METOCLOPRAMIDE (REGLAN) 5 MG TABLET    Take 5 mg by mouth 3 (three) times daily.   MULTIPLE VITAMIN (MULTIVITAMIN) CAPSULE    Take 1 capsule by mouth daily.   PRENATAL MULTIVIT-MIN-FE-FA (PRENATAL/IRON) TABS    Take 1 tablet by mouth 2 (two) times daily.   TRAZODONE (DESYREL) 50 MG TABLET    Take 75 mg by mouth at bedtime.  Modified Medications   No medications on file  Discontinued Medications   No medications on file     Review of Systems  As above. All other systems reviewed are negative  Filed Vitals:   08/21/14 1543  BP: 140/78  Pulse: 70  Temp: 97.8 F (36.6 C)  Weight: 171 lb (77.565 kg)   Body mass index is 28.46 kg/(m^2).  Physical Exam CONSTITUTIONAL: Looks well in NAD. Awake, alert and oriented x 3. Sitting in w/c HEENT: PERRLA. Oropharynx clear and without exudate NECK: Supple. Nontender. No palpable cervical or supraclavicular lymph nodes. No carotid bruit b/l. No thyromegaly or thyroid mass palpable.  CVS: Regular rate without murmur, gallop or rub. LUNGS: CTA b/l no wheezing, rales or rhonchi. ABDOMEN: Bowel sounds present x 4. nontender, (+)distended. No palpable mass or bruit EXTREMITIES: trace LE edema b/l. Distal pulses palpable. No calf tenderness PSYCH: Affect, behavior and mood normal SKIN: papulosquamous rash on her arms but  no vesicular formation or redness   Labs reviewed: No visits with results within 3 Month(s) from this visit. Latest known visit with results is:  Orders Only on 04/20/2014  Component Date Value Ref Range Status  . WBC 04/20/2014 5.8  4.0 - 10.5 K/uL Final  . RBC 04/20/2014 5.49* 3.87 - 5.11 MIL/uL Final  . Hemoglobin 04/20/2014 14.5  12.0 - 15.0 g/dL Final  . HCT 04/20/2014 42.1  36.0 - 46.0 % Final  . MCV 04/20/2014 76.7* 78.0 - 100.0 fL Final  . MCH 04/20/2014 26.4  26.0 - 34.0 pg Final  . MCHC 04/20/2014 34.4  30.0 - 36.0 g/dL Final  . RDW 04/20/2014 15.9* 11.5 - 15.5 % Final  . Platelets 04/20/2014 154  150 - 400 K/uL Final  . Neutrophils Relative % 04/20/2014 47  43 - 77 % Final  . Neutro Abs 04/20/2014 2.7  1.7 - 7.7 K/uL Final  . Lymphocytes Relative 04/20/2014 44  12 - 46 % Final  . Lymphs Abs 04/20/2014 2.6  0.7 - 4.0 K/uL Final  . Monocytes Relative 04/20/2014 7  3 - 12 % Final  . Monocytes Absolute 04/20/2014 0.4  0.1 - 1.0 K/uL Final  . Eosinophils Relative 04/20/2014 2  0 - 5 % Final  . Eosinophils Absolute 04/20/2014 0.1  0.0 - 0.7 K/uL Final  . Basophils Relative 04/20/2014 0  0 - 1 % Final  . Basophils Absolute 04/20/2014 0.0  0.0 - 0.1 K/uL Final  . Smear Review 04/20/2014 Criteria for review not met   Final  . Ferritin 04/20/2014 120  10 - 291 ng/mL Final  . Erythropoietin 04/20/2014 12.0  2.6 - 18.5 mIU/mL Final  Abdominal US results reviewed- mild hepatomegaly but no masses   Assessment/Plan   ICD-9-CM ICD-10-CM   1. Essential hypertension, benign- stable 401.1 I10   2. Rheumatoid arthritis- pain controlled 714.0 M06.9   3. Paraplegia 344.1 G82.20   4. Anemia, iron deficiency- stable 280.9 D50.9   5. Chronic congestive heart failure, unspecified congestive heart failure type- stable 428.0 I50.9   6. History of breast cancer V10.3 Z85.3   7.      Mild hepatomegaly - questionable etiology 8.      Rash  --observation warranted for rash. Most likely  contact dermatitis vs eczematous rash. Continue moisturizing lotion  -- she needs to f/u with H/O for her breast CA. Refer to H/O at [facility]  --she has not seen cardiology in a while and requests referral. Will refer to [facility]ology at [facility]  --  she is medically stable on current tx plan. conitnue current medications as ordered. Will follow  Melodee Lupe S. Perlie Gold  [facility] [address] [place], [place] [address] [phone] Office (Wednesdays and Fridays 8 AM - 5 PM) [phone] Cell (Monday-Friday 8 AM - 5 PM)

## 2014-08-27 MED ORDER — PROMETHAZINE HCL 25 MG PO TABS: 25.0000 mg | ORAL_TABLET | Freq: Four times a day (QID) | ORAL | Status: DC | PRN

## 2014-09-13 ENCOUNTER — Telehealth: Payer: Self-pay | Admitting: Cardiology

## 2014-09-13 NOTE — Telephone Encounter (Signed)
Received records from [facility] for appointment with Dr Percival Spanish on 10/29/14.  Records given to [facility] (medical records) for Dr Hochrein's schedule on 10/29/14.  lp

## 2014-09-17 ENCOUNTER — Non-Acute Institutional Stay (SKILLED_NURSING_FACILITY): Payer: Medicare Other | Admitting: Adult Health

## 2014-09-17 DIAGNOSIS — M069 Rheumatoid arthritis, unspecified: Secondary | ICD-10-CM | POA: Diagnosis not present

## 2014-09-17 DIAGNOSIS — K5909 Other constipation: Secondary | ICD-10-CM | POA: Diagnosis not present

## 2014-09-17 DIAGNOSIS — K3184 Gastroparesis: Secondary | ICD-10-CM

## 2014-09-17 DIAGNOSIS — I10 Essential (primary) hypertension: Secondary | ICD-10-CM | POA: Diagnosis not present

## 2014-09-17 DIAGNOSIS — J309 Allergic rhinitis, unspecified: Secondary | ICD-10-CM

## 2014-09-17 DIAGNOSIS — I509 Heart failure, unspecified: Secondary | ICD-10-CM

## 2014-09-25 ENCOUNTER — Telehealth: Payer: Self-pay | Admitting: Cardiology

## 2014-09-25 NOTE — Telephone Encounter (Signed)
Received records from [facility] for appointment with Dr Percival Spanish on 10/29/14.  Records given to [facility] (medical records) for Dr Hochrein's schedule on 10/29/14. lp

## 2014-10-02 ENCOUNTER — Encounter: Payer: Self-pay | Admitting: Internal Medicine

## 2014-10-02 ENCOUNTER — Non-Acute Institutional Stay (SKILLED_NURSING_FACILITY): Payer: Medicare Other | Admitting: Internal Medicine

## 2014-10-02 DIAGNOSIS — L850 Acquired ichthyosis: Secondary | ICD-10-CM | POA: Diagnosis not present

## 2014-10-02 DIAGNOSIS — L853 Xerosis cutis: Secondary | ICD-10-CM

## 2014-10-02 DIAGNOSIS — M069 Rheumatoid arthritis, unspecified: Secondary | ICD-10-CM

## 2014-10-02 DIAGNOSIS — L299 Pruritus, unspecified: Secondary | ICD-10-CM | POA: Diagnosis not present

## 2014-10-02 NOTE — Progress Notes (Signed)
Patient ID: [name], female   DOB: [birth year], 64 y.o.   MRN: [id number]    [place] of Service: SNF (31)   No Known Allergies  Chief Complaint  Patient presents with  . Acute Visit    rash    HPI:  64 yo female long term resident seen today for rash. She reports intermittent itchy rash to forearm b/l x several mos. No insect bites. No blisters, f/c, pain, numbness/tingling. No change in soaps, detergents or lotions. She admits to only applying lotion after showering instead of daily. She has a hx RA and takes plaquenil.  CODE STATUS: FULL  Medications: Patient's Medications  New Prescriptions   No medications on file  Previous Medications   ALBUTEROL (PROVENTIL HFA) 108 (90 BASE) MCG/ACT INHALER    Inhale 1 puff into the lungs every 8 (eight) hours as needed for wheezing or shortness of breath.   BACLOFEN (LIORESAL) 10 MG TABLET    Take 10 mg by mouth 2 (two) times daily.    BISACODYL (DULCOLAX) 10 MG SUPPOSITORY    Place 10 mg rectally every other day.    CALCIUM-VITAMIN D (OYSTER CALCIUM 500 + D) 500-200 MG-UNIT PER TABLET    Take 1 tablet by mouth 2 (two) times daily.    CARVEDILOL (COREG) 6.25 MG TABLET    Take 6.25 mg by mouth 2 (two) times daily with a meal.   CETIRIZINE (ZYRTEC) 10 MG TABLET    Take 10 mg by mouth daily.   CRANBERRY 450 MG TABS    Take 1 tablet by mouth daily. For UTI   DIPHENHYDRAMINE (SOMINEX) 25 MG TABLET    Take 25 mg by mouth at bedtime as needed for sleep.   ESCITALOPRAM (LEXAPRO) 10 MG TABLET    Give 15 mg= 1 1/2 tabs by mouth daily .   ESOMEPRAZOLE (NEXIUM) 40 MG CAPSULE    Take 40 mg by mouth daily before breakfast.   FUROSEMIDE (LASIX) 20 MG TABLET    Take 20 mg by mouth daily.   GUAIFENESIN (ROBITUSSIN) 100 MG/5ML SYRUP    Take 100 mg by mouth 3 (three) times daily as needed for cough.   HYDROCODONE-ACETAMINOPHEN (NORCO) 7.5-325 MG PER TABLET    Take one tablet by mouth every 4 hours as needed  for pain   HYDROXYCHLOROQUINE (PLAQUENIL) 200 MG TABLET    Take by mouth daily.   LINACLOTIDE (LINZESS) 290 MCG CAPS CAPSULE    Take 1 capsule (290 mcg total) by mouth daily.   MENTHOL, TOPICAL ANALGESIC, (BIOFREEZE EX)    Apply topically daily.   METOCLOPRAMIDE (REGLAN) 5 MG TABLET    Take 5 mg by mouth 3 (three) times daily.   MULTIPLE VITAMIN (MULTIVITAMIN) CAPSULE    Take 1 capsule by mouth daily.   PRENATAL MULTIVIT-MIN-FE-FA (PRENATAL/IRON) TABS    Take 1 tablet by mouth 2 (two) times daily.   PROMETHAZINE (PHENERGAN) 25 MG TABLET    Take 1 tablet (25 mg total) by mouth every 6 (six) hours as needed for nausea or vomiting.   TRAZODONE (DESYREL) 50 MG TABLET    Take 75 mg by mouth at bedtime.   Modified Medications   No medications on file  Discontinued Medications   No medications on file     Review of Systems  Respiratory: Negative for chest tightness, shortness of breath and wheezing.   Cardiovascular: Negative for chest pain and palpitations.  Gastrointestinal: Positive for abdominal pain. Negative  for nausea and vomiting.  Musculoskeletal: Positive for arthralgias and gait problem.  Skin: Positive for rash. Negative for wound.    Filed Vitals:   10/02/14 1531  BP: 138/80  Pulse: 74  Temp: 98 F (36.7 C)  Weight: 173 lb (78.472 kg)   Body mass index is 28.79 kg/(m^2).  Physical Exam  Skin: Skin is warm and dry. Rash noted. No bruising, no ecchymosis, no lesion and no petechiae noted. There is erythema.  Very dry but intact. papulosquamous rash b/l forearms with redness on left where scratching noted. (+) excoriations. No burrows noted. No vesicles.     Labs reviewed: No visits with results within 3 Month(s) from this visit. Latest known visit with results is:  Orders Only on 04/20/2014  Component Date Value Ref Range Status  . WBC 04/20/2014 5.8  4.0 - 10.5 K/uL Final  . RBC 04/20/2014 5.49* 3.87 - 5.11 MIL/uL Final  . Hemoglobin 04/20/2014 14.5  12.0 - 15.0  g/dL Final  . HCT 04/20/2014 42.1  36.0 - 46.0 % Final  . MCV 04/20/2014 76.7* 78.0 - 100.0 fL Final  . MCH 04/20/2014 26.4  26.0 - 34.0 pg Final  . MCHC 04/20/2014 34.4  30.0 - 36.0 g/dL Final  . RDW 04/20/2014 15.9* 11.5 - 15.5 % Final  . Platelets 04/20/2014 154  150 - 400 K/uL Final  . Neutrophils Relative % 04/20/2014 47  43 - 77 % Final  . Neutro Abs 04/20/2014 2.7  1.7 - 7.7 K/uL Final  . Lymphocytes Relative 04/20/2014 44  12 - 46 % Final  . Lymphs Abs 04/20/2014 2.6  0.7 - 4.0 K/uL Final  . Monocytes Relative 04/20/2014 7  3 - 12 % Final  . Monocytes Absolute 04/20/2014 0.4  0.1 - 1.0 K/uL Final  . Eosinophils Relative 04/20/2014 2  0 - 5 % Final  . Eosinophils Absolute 04/20/2014 0.1  0.0 - 0.7 K/uL Final  . Basophils Relative 04/20/2014 0  0 - 1 % Final  . Basophils Absolute 04/20/2014 0.0  0.0 - 0.1 K/uL Final  . Smear Review 04/20/2014 Criteria for review not met   Final  . Ferritin 04/20/2014 120  10 - 291 ng/mL Final  . Erythropoietin 04/20/2014 12.0  2.6 - 18.5 mIU/mL Final     Assessment/Plan   ICD-9-CM ICD-10-CM   1. Dry skin dermatitis 692.89 L85.0   2. Pruritus 698.9 L29.9   3. Rheumatoid arthritis- stable on plaquenil  --recommend daily application of moisturizing lotion to skin to reduced dryness. If no better, will consider steroid cream or lotion  --will follow  [place]. Perlie Gold  [facility] [address] [place], [place] [address] [phone] Office (Wednesdays and Fridays 8 AM - 5 PM) [phone] Cell (Monday-Friday 8 AM - 5 PM)

## 2014-10-04 MED ORDER — LISINOPRIL 5 MG PO TABS: 5.0000 mg | ORAL_TABLET | Freq: Every day | ORAL | Status: DC

## 2014-10-04 NOTE — Progress Notes (Signed)
Patient ID: [name], female   DOB: 1950/10/28, 64 y.o.   MRN: [id number]  [name] living [place]     No Known Allergies     Chief Complaint  Patient presents with  . Medical Management of Chronic Issues    HPI:  She is a long term resident of this facility being seen for the management of her chronic illnesses. Her blood pressure readings have been elevated up to 150/90. She will require furthe medication adjustment. She is not voicing any complaints or concerns. There are no nursing concerns at this time. She was seen by hematology/oncology at [facility] regarding her breast cancer.  She is awaiting her cardiology appointment.    Past Medical History  Diagnosis Date  . Immobility syndrome (paraplegic)   . GERD (gastroesophageal reflux disease) 02/02/2009  . Leiomyoma of uterus   . Anemia 10/12/2008  . Hypertension 10/12/2008  . Chronic kidney disease   . Cancer     rt breast  . CHF (congestive heart failure)   . Malignant neoplasm of breast (female), unspecified site 10/05/2010  . Gastroparesis 10/05/2010  . Diaphragmatic hernia without mention of obstruction or gangrene 08/29/2010  . Hypotension, unspecified 05/20/2009  . Edema 05/20/2009  . Rheumatoid arthritis 01/17/2009  . Atherosclerosis of native arteries of the extremities, unspecified 11/06/2008  . Iron deficiency anemia, unspecified 10/12/2008  . Paraplegia     Past Surgical History  Procedure Laterality Date  . Breast surgery  2003    Right mastectomy  . Wound debridement  09/02/2008    Large sacral back open wound  . Pressure ulcer debridement  2009    on back    VITAL SIGNS BP 140/87 mmHg  Pulse 83  Ht 5' (1.524 m)  Wt 170 lb (77.111 kg)  BMI 33.20 kg/m2  SpO2 96%   Outpatient Encounter Prescriptions as of 09/17/2014  Medication Sig  . albuterol (PROVENTIL HFA) 108 (90 BASE) MCG/ACT inhaler Inhale 1 puff into the lungs every 8 (eight) hours as needed for wheezing or shortness of breath.   . baclofen (LIORESAL) 10 MG tablet Take 10 mg by mouth 2 (two) times daily.   . bisacodyl (DULCOLAX) 10 MG suppository Place 10 mg rectally every other day.   . calcium-vitamin D (OYSTER CALCIUM 500 + D) 500-200 MG-UNIT per tablet Take 1 tablet by mouth 2 (two) times daily.   . carvedilol (COREG) 6.25 MG tablet Take 6.25 mg by mouth 2 (two) times daily with a meal.  . cetirizine (ZYRTEC) 10 MG tablet Take 10 mg by mouth daily.  . Cranberry 450 MG TABS Take 1 tablet by mouth daily. For UTI  . diphenhydrAMINE (SOMINEX) 25 MG tablet Take 25 mg by mouth at bedtime as needed for sleep.  Marland Kitchen escitalopram (LEXAPRO) 10 MG tablet Give 15 mg= 1 1/2 tabs by mouth daily .  . esomeprazole (NEXIUM) 40 MG capsule Take 40 mg by mouth daily before breakfast.  . furosemide (LASIX) 20 MG tablet Take 20 mg by mouth daily.  Marland Kitchen guaifenesin (ROBITUSSIN) 100 MG/5ML syrup Take 100 mg by mouth 3 (three) times daily as needed for cough.  Marland Kitchen HYDROcodone-acetaminophen (NORCO) 7.5-325 MG per tablet Take one tablet by mouth every 4 hours as needed for pain  . hydroxychloroquine (PLAQUENIL) 200 MG tablet Take by mouth daily.  . Linaclotide (LINZESS) 290 MCG CAPS capsule Take 1 capsule (290 mcg total) by mouth daily.  . Menthol, Topical Analgesic, (BIOFREEZE EX) Apply topically daily.  . metoCLOPramide (REGLAN) 5 MG  tablet Take 5 mg by mouth 3 (three) times daily.  . Multiple Vitamin (MULTIVITAMIN) capsule Take 1 capsule by mouth daily.  . Prenatal Multivit-Min-Fe-FA (PRENATAL/IRON) TABS Take 1 tablet by mouth 2 (two) times daily.  . promethazine (PHENERGAN) 25 MG tablet Take 1 tablet (25 mg total) by mouth every 6 (six) hours as needed for nausea or vomiting.  . traZODone (DESYREL) 50 MG tablet Take 75 mg by mouth at bedtime.      SIGNIFICANT DIAGNOSTIC EXAMS   07-04-14: kub: apparent large calcification right pelvic region modest diffuse ileus    LABS REVIEWED:   04-20-14: wbc 5.8; hgb 14.5; hct 42.1 ;mcv 76 ;plkt 154;  ferritin 120; EPO 12.0  07-05-14: wbc 5.5; hgb 14.0; hct 43.0; mcv 87.8; plt 135; glucose 154; bun 7; creat 0.7; k+3.9; na++147; alk phos 96; ast 64 alt 101; t bili 0.3; albumin 3.5        ROS  Constitutional: Negative for malaise/fatigue.  Respiratory: Negative for cough and shortness of breath.   Cardiovascular: Negative for chest pain, palpitations and leg swelling.  Gastrointestinal: negative for abdominal pain. Negative for heartburn, nausea and vomiting.   Musculoskeletal: Negative for myalgias and joint pain.  Skin: Negative.   Psychiatric/Behavioral: Negative for depression. The patient is not nervous/anxious.      ROS Constitutional: Negative for malaise/fatigue.  Respiratory: Negative for cough and shortness of breath.   Cardiovascular: Negative for chest pain, palpitations and leg swelling.  Gastrointestinal: Positive for abdominal pain. Negative for heartburn, nausea and vomiting.       Has abdominal pain right side worse with movement and with deep breaths   Musculoskeletal: Negative for myalgias and joint pain.  Skin: Negative.   Psychiatric/Behavioral: Negative for depression. The patient is not nervous/anxious.    Physical Exam Constitutional: She is oriented to person, place, and time. She appears well-developed and well-nourished. No distress.  Neck: Neck supple. No JVD present. No thyromegaly present.  Cardiovascular: Normal rate, regular rhythm and intact distal pulses.   Respiratory: Effort normal and breath sounds normal. No respiratory distress. She has no wheezes.  GI: Bowel sounds are normal. No distention  There is no tenderness.    Musculoskeletal: She exhibits no edema.  Able to move upper extremities; is paraplegic   Neurological: She is alert and oriented to person, place, and time.  Skin: Skin is warm and dry. She is not diaphoretic.  Psychiatric: She has a normal mood and affect   ASSESSMENT/ PLAN:   1. CHF: is stable will continue lasix 20  mg dialy; will monitor   2. Hypertension:  will continue coreg 6.25 mg twice daily; will begin lisinopril 5 mg daily will check bmp in one week; and will have nursing check blood pressure twice daily  will monitor   3. Gastroparesis: is stable will continue nexium 40 mg daily and reglan 5 mg three times daily with meals will monitor   4. RA: is stable will continue plaquenil 200 mg daily with folic acid daily has vicodin 7.5/325 mg every 4 hours as needed for pain and will monitor   5. Paraplegia: without change: no complaints of pain present; takes baclofen 10 mg twice daily for spasticity will monitor   6. Anemia: will continue prenatal iron twice daily   7. Allergic rhinitis: will continue zyrtec 10 mg daily   8. Depression: she is emotionally stable will continue lexapro 15 mg daily   9. Constipation: will continue linzess 145 mcg daily; will continue dulcoalx supp  every other day and will monitor     Ok Edwards NP [facility]  Contact [phone] Monday through Friday 8am- 5pm  After hours call [phone]

## 2014-10-05 ENCOUNTER — Non-Acute Institutional Stay (SKILLED_NURSING_FACILITY): Payer: Medicare Other | Admitting: Adult Health

## 2014-10-05 DIAGNOSIS — I10 Essential (primary) hypertension: Secondary | ICD-10-CM | POA: Diagnosis not present

## 2014-10-16 ENCOUNTER — Non-Acute Institutional Stay (SKILLED_NURSING_FACILITY): Payer: Medicare Other | Admitting: Adult Health

## 2014-10-16 DIAGNOSIS — I10 Essential (primary) hypertension: Secondary | ICD-10-CM | POA: Diagnosis not present

## 2014-10-16 DIAGNOSIS — G8220 Paraplegia, unspecified: Secondary | ICD-10-CM

## 2014-10-16 DIAGNOSIS — D509 Iron deficiency anemia, unspecified: Secondary | ICD-10-CM

## 2014-10-16 DIAGNOSIS — K3184 Gastroparesis: Secondary | ICD-10-CM | POA: Diagnosis not present

## 2014-10-16 DIAGNOSIS — K5909 Other constipation: Secondary | ICD-10-CM | POA: Diagnosis not present

## 2014-10-16 DIAGNOSIS — M069 Rheumatoid arthritis, unspecified: Secondary | ICD-10-CM

## 2014-10-16 DIAGNOSIS — J309 Allergic rhinitis, unspecified: Secondary | ICD-10-CM

## 2014-10-16 DIAGNOSIS — I509 Heart failure, unspecified: Secondary | ICD-10-CM | POA: Diagnosis not present

## 2014-10-22 ENCOUNTER — Telehealth: Payer: Self-pay | Admitting: Cardiology

## 2014-10-29 ENCOUNTER — Encounter: Payer: Self-pay | Admitting: Cardiology

## 2014-10-29 ENCOUNTER — Ambulatory Visit (INDEPENDENT_AMBULATORY_CARE_PROVIDER_SITE_OTHER): Payer: Medicare Other | Admitting: Cardiology

## 2014-10-29 VITALS — BP 102/68 | HR 94 | Ht 66.0 in | Wt 166.0 lb

## 2014-10-29 DIAGNOSIS — I509 Heart failure, unspecified: Secondary | ICD-10-CM | POA: Diagnosis not present
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
## 2014-10-29 NOTE — Patient Instructions (Signed)
Your physician has requested that you have an echocardiogram. Echocardiography is a painless test that uses sound waves to create images of your heart. It provides your doctor with information about the size and shape of your heart and how well your heart's chambers and valves are working. This procedure takes approximately one hour. There are no restrictions for this procedure. We will call with the results .  Your physician recommends that you schedule a follow-up appointment in: One year.

## 2014-10-31 NOTE — Telephone Encounter (Signed)
Close encounter 

## 2014-11-02 ENCOUNTER — Ambulatory Visit (HOSPITAL_COMMUNITY)
Admission: RE | Admit: 2014-11-02 | Discharge: 2014-11-02 | Disposition: A | Discharge status: Home / Self Care | Payer: Medicare Other | Source: Ambulatory Visit | Attending: Cardiology | Admitting: Cardiology

## 2014-11-02 DIAGNOSIS — I509 Heart failure, unspecified: Secondary | ICD-10-CM | POA: Diagnosis not present

## 2014-11-02 NOTE — Progress Notes (Signed)
2D Echocardiogram Complete.  11/02/2014   Layna Roeper, RDCS   The Parasternal Images are technically difficult due to the patients limited mobility.  She is paraplegic, therefore the exam was performed with Mrs. Fukuda sitting upright in her wheelchair.

## 2014-11-26 MED ORDER — LISINOPRIL 5 MG PO TABS: 10.0000 mg | ORAL_TABLET | Freq: Every day | ORAL | Status: DC
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
## 2014-11-27 ENCOUNTER — Non-Acute Institutional Stay (SKILLED_NURSING_FACILITY): Payer: Medicare Other | Admitting: Internal Medicine

## 2014-11-27 ENCOUNTER — Encounter: Payer: Self-pay | Admitting: Internal Medicine

## 2014-11-27 DIAGNOSIS — M069 Rheumatoid arthritis, unspecified: Secondary | ICD-10-CM

## 2014-11-27 DIAGNOSIS — G8220 Paraplegia, unspecified: Secondary | ICD-10-CM | POA: Diagnosis not present

## 2014-11-27 DIAGNOSIS — F329 Major depressive disorder, single episode, unspecified: Secondary | ICD-10-CM

## 2014-11-27 DIAGNOSIS — N319 Neuromuscular dysfunction of bladder, unspecified: Secondary | ICD-10-CM

## 2014-11-27 DIAGNOSIS — R42 Dizziness and giddiness: Secondary | ICD-10-CM | POA: Diagnosis not present

## 2014-11-27 DIAGNOSIS — R609 Edema, unspecified: Secondary | ICD-10-CM

## 2014-11-27 DIAGNOSIS — R079 Chest pain, unspecified: Secondary | ICD-10-CM

## 2014-11-27 DIAGNOSIS — F32A Depression, unspecified: Secondary | ICD-10-CM
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
## 2015-01-01 ENCOUNTER — Non-Acute Institutional Stay (SKILLED_NURSING_FACILITY): Payer: Medicare Other | Admitting: Internal Medicine

## 2015-01-01 ENCOUNTER — Encounter: Payer: Self-pay | Admitting: Internal Medicine

## 2015-01-01 DIAGNOSIS — I10 Essential (primary) hypertension: Secondary | ICD-10-CM | POA: Diagnosis not present

## 2015-01-01 DIAGNOSIS — I509 Heart failure, unspecified: Secondary | ICD-10-CM

## 2015-01-01 DIAGNOSIS — M0579 Rheumatoid arthritis with rheumatoid factor of multiple sites without organ or systems involvement: Secondary | ICD-10-CM | POA: Diagnosis not present

## 2015-01-01 DIAGNOSIS — K5909 Other constipation: Secondary | ICD-10-CM | POA: Diagnosis not present

## 2015-01-01 DIAGNOSIS — G8220 Paraplegia, unspecified: Secondary | ICD-10-CM | POA: Diagnosis not present

## 2015-01-01 DIAGNOSIS — R1011 Right upper quadrant pain: Secondary | ICD-10-CM

## 2015-01-01 DIAGNOSIS — K219 Gastro-esophageal reflux disease without esophagitis: Secondary | ICD-10-CM
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
## 2015-01-03 ENCOUNTER — Encounter (HOSPITAL_COMMUNITY): Payer: Self-pay | Admitting: Emergency Medicine

## 2015-01-03 ENCOUNTER — Emergency Department (HOSPITAL_COMMUNITY): Payer: Medicare Other

## 2015-01-03 ENCOUNTER — Emergency Department (HOSPITAL_COMMUNITY)
Admission: EM | Admit: 2015-01-03 | Discharge: 2015-01-03 | Disposition: A | Discharge status: Home / Self Care | Payer: Medicare Other | Attending: Emergency Medicine | Admitting: Emergency Medicine

## 2015-01-03 DIAGNOSIS — M19011 Primary osteoarthritis, right shoulder: Secondary | ICD-10-CM | POA: Insufficient documentation

## 2015-01-03 DIAGNOSIS — J189 Pneumonia, unspecified organism: Secondary | ICD-10-CM

## 2015-01-03 DIAGNOSIS — R1010 Upper abdominal pain, unspecified: Secondary | ICD-10-CM | POA: Diagnosis not present

## 2015-01-03 DIAGNOSIS — Z87891 Personal history of nicotine dependence: Secondary | ICD-10-CM | POA: Diagnosis not present

## 2015-01-03 DIAGNOSIS — I10 Essential (primary) hypertension: Secondary | ICD-10-CM | POA: Diagnosis not present

## 2015-01-03 DIAGNOSIS — M19019 Primary osteoarthritis, unspecified shoulder: Secondary | ICD-10-CM

## 2015-01-03 DIAGNOSIS — Z862 Personal history of diseases of the blood and blood-forming organs and certain disorders involving the immune mechanism: Secondary | ICD-10-CM | POA: Insufficient documentation

## 2015-01-03 DIAGNOSIS — Z8541 Personal history of malignant neoplasm of cervix uteri: Secondary | ICD-10-CM | POA: Insufficient documentation

## 2015-01-03 DIAGNOSIS — J90 Pleural effusion, not elsewhere classified: Secondary | ICD-10-CM | POA: Diagnosis not present

## 2015-01-03 DIAGNOSIS — K219 Gastro-esophageal reflux disease without esophagitis: Secondary | ICD-10-CM | POA: Diagnosis not present

## 2015-01-03 DIAGNOSIS — M069 Rheumatoid arthritis, unspecified: Secondary | ICD-10-CM | POA: Diagnosis not present

## 2015-01-03 DIAGNOSIS — K3184 Gastroparesis: Secondary | ICD-10-CM | POA: Insufficient documentation

## 2015-01-03 DIAGNOSIS — I509 Heart failure, unspecified: Secondary | ICD-10-CM | POA: Diagnosis not present

## 2015-01-03 DIAGNOSIS — Z853 Personal history of malignant neoplasm of breast: Secondary | ICD-10-CM | POA: Insufficient documentation

## 2015-01-03 DIAGNOSIS — J159 Unspecified bacterial pneumonia: Secondary | ICD-10-CM | POA: Insufficient documentation

## 2015-01-03 DIAGNOSIS — Z79899 Other long term (current) drug therapy: Secondary | ICD-10-CM | POA: Insufficient documentation

## 2015-01-03 DIAGNOSIS — M25511 Pain in right shoulder: Secondary | ICD-10-CM | POA: Diagnosis present

## 2015-01-03 LAB — COMPREHENSIVE METABOLIC PANEL
ALBUMIN: 3.5 g/dL (ref 3.5–5.0)
ALK PHOS: 78 U/L (ref 38–126)
ALT: 67 U/L — AB (ref 14–54)
AST: 40 U/L (ref 15–41)
Anion gap: 12 (ref 5–15)
BUN: 7 mg/dL (ref 6–20)
CALCIUM: 9 mg/dL (ref 8.9–10.3)
CO2: 23 mmol/L (ref 22–32)
CREATININE: 0.66 mg/dL (ref 0.44–1.00)
Chloride: 100 mmol/L — ABNORMAL LOW (ref 101–111)
Glucose, Bld: 136 mg/dL — ABNORMAL HIGH (ref 65–99)
POTASSIUM: 3.7 mmol/L (ref 3.5–5.1)
Sodium: 135 mmol/L (ref 135–145)
TOTAL PROTEIN: 8.3 g/dL — AB (ref 6.5–8.1)
Total Bilirubin: 0.8 mg/dL (ref 0.3–1.2)

## 2015-01-03 LAB — LIPASE, BLOOD: Lipase: 21 U/L — ABNORMAL LOW (ref 22–51)

## 2015-01-03 LAB — CBC
HCT: 47.3 % — ABNORMAL HIGH (ref 36.0–46.0)
Hemoglobin: 16.3 g/dL — ABNORMAL HIGH (ref 12.0–15.0)
MCH: 29.3 pg (ref 26.0–34.0)
MCHC: 34.5 g/dL (ref 30.0–36.0)
MCV: 85.1 fL (ref 78.0–100.0)
Platelets: 172 10*3/uL (ref 150–400)
RBC: 5.56 MIL/uL — ABNORMAL HIGH (ref 3.87–5.11)
RDW: 13.2 % (ref 11.5–15.5)
WBC: 7.5 10*3/uL (ref 4.0–10.5)

## 2015-01-03 MED ORDER — LEVOFLOXACIN 500 MG PO TABS: 500.0000 mg | ORAL_TABLET | Freq: Every day | ORAL | Status: DC

## 2015-01-03 MED ORDER — LEVOFLOXACIN 500 MG PO TABS
500.0000 mg | ORAL_TABLET | Freq: Once | ORAL | Status: AC
  Administered 2015-01-03: 500 mg via ORAL
  Filled 2015-01-03: qty 1

## 2015-01-03 MED ORDER — MORPHINE SULFATE 4 MG/ML IJ SOLN
4.0000 mg | Freq: Once | INTRAMUSCULAR | Status: AC
  Administered 2015-01-03: 4 mg via INTRAVENOUS
  Filled 2015-01-03: qty 1

## 2015-01-03 MED ORDER — ONDANSETRON HCL 4 MG/2ML IJ SOLN
4.0000 mg | Freq: Once | INTRAMUSCULAR | Status: AC
  Administered 2015-01-03: 4 mg via INTRAVENOUS
  Filled 2015-01-03: qty 2

## 2015-01-03 MED ORDER — IOHEXOL 300 MG/ML  SOLN
25.0000 mL | INTRAMUSCULAR | Status: AC
Start: 2015-01-03 — End: 2015-01-03
  Administered 2015-01-03: 25 mL via ORAL

## 2015-01-03 MED ORDER — OXYCODONE-ACETAMINOPHEN 5-325 MG PO TABS: 1.0000 | ORAL_TABLET | Freq: Four times a day (QID) | ORAL | Status: DC | PRN

## 2015-01-03 MED ORDER — IOHEXOL 300 MG/ML  SOLN: 25.0000 mL | INTRAMUSCULAR | Status: DC

## 2015-01-03 MED ORDER — IOHEXOL 300 MG/ML  SOLN
100.0000 mL | Freq: Once | INTRAMUSCULAR | Status: AC | PRN
  Administered 2015-01-03: 100 mL via INTRAVENOUS
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
## 2015-01-03 NOTE — ED Notes (Signed)
Pt transported to CT ?

## 2015-01-03 NOTE — ED Notes (Signed)
Pt back from CT

## 2015-01-03 NOTE — ED Notes (Signed)
Pt has finished contrast. CT notified.

## 2015-01-03 NOTE — ED Notes (Signed)
Pt returned from xray

## 2015-01-03 NOTE — ED Notes (Signed)
Pt to xray

## 2015-01-03 NOTE — Discharge Instructions (Signed)
Return to the ED with any concerns including difficulty breathing, chest pain, fainting, vomiting and not able to keep down liquids decreased level of alertness/lethargy, or any other alarming symptoms  The xrays and CT scan show a pleural effusion in your right lower lung- this may be associated with an underlying pneumonia- we are starting you on antibiotics, you should schedule a close followup appointment with your primary care doctor as further tests on this effusion may be needed.

## 2015-01-03 NOTE — ED Notes (Addendum)
Per EMS- pt coming to ED from [facility] for abdominal pain that began Sunday, pt had done [place] Tuesday - negative. Now the pain is going into right shoulder. Excruciating pain began last night. Pt took Trazadone at 3 am - experienced minimal relief. Pt is paraplegic, uses wheel chair to get around. Denies fever. Denies any changes in appetite, bowel, or bladder changes. Pt has hx of mastectomy, left arm is restricted. Pt is a/o x4. BP 148/98, regular 86 bpm. Pain 10/10.

## 2015-01-03 NOTE — ED Notes (Signed)
Pt leaving department at this time with [facility]

## 2015-01-15 ENCOUNTER — Non-Acute Institutional Stay (SKILLED_NURSING_FACILITY): Payer: Medicare Other | Admitting: Internal Medicine

## 2015-01-15 ENCOUNTER — Encounter: Payer: Self-pay | Admitting: Internal Medicine

## 2015-01-15 DIAGNOSIS — K449 Diaphragmatic hernia without obstruction or gangrene: Secondary | ICD-10-CM

## 2015-01-15 DIAGNOSIS — J90 Pleural effusion, not elsewhere classified: Secondary | ICD-10-CM

## 2015-01-15 DIAGNOSIS — M19011 Primary osteoarthritis, right shoulder: Secondary | ICD-10-CM

## 2015-01-15 DIAGNOSIS — M069 Rheumatoid arthritis, unspecified: Secondary | ICD-10-CM | POA: Diagnosis not present
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
## 2015-01-29 ENCOUNTER — Other Ambulatory Visit: Payer: Self-pay

## 2015-01-29 MED ORDER — HYDROCODONE-ACETAMINOPHEN 7.5-325 MG PO TABS: ORAL_TABLET | ORAL | Status: DC

## 2015-01-29 NOTE — Telephone Encounter (Signed)
RX faxed to [name] @ [phone], phone number [phone]

## 2015-02-04 ENCOUNTER — Non-Acute Institutional Stay (SKILLED_NURSING_FACILITY): Payer: Medicare Other | Admitting: Adult Health

## 2015-02-04 DIAGNOSIS — G8220 Paraplegia, unspecified: Secondary | ICD-10-CM

## 2015-02-04 DIAGNOSIS — I509 Heart failure, unspecified: Secondary | ICD-10-CM

## 2015-02-04 DIAGNOSIS — I10 Essential (primary) hypertension: Secondary | ICD-10-CM

## 2015-02-04 DIAGNOSIS — M069 Rheumatoid arthritis, unspecified: Secondary | ICD-10-CM

## 2015-02-04 DIAGNOSIS — K3184 Gastroparesis: Secondary | ICD-10-CM

## 2015-03-25 ENCOUNTER — Other Ambulatory Visit: Payer: Self-pay | Admitting: *Deleted

## 2015-03-25 MED ORDER — HYDROCODONE-ACETAMINOPHEN 7.5-325 MG PO TABS: ORAL_TABLET | ORAL | Status: DC

## 2015-03-25 NOTE — Telephone Encounter (Signed)
[id number]

## 2015-04-05 ENCOUNTER — Non-Acute Institutional Stay (SKILLED_NURSING_FACILITY): Payer: Medicare Other | Admitting: Adult Health

## 2015-04-05 DIAGNOSIS — K3184 Gastroparesis: Secondary | ICD-10-CM

## 2015-04-05 DIAGNOSIS — D509 Iron deficiency anemia, unspecified: Secondary | ICD-10-CM

## 2015-04-05 DIAGNOSIS — G8220 Paraplegia, unspecified: Secondary | ICD-10-CM

## 2015-04-05 DIAGNOSIS — M069 Rheumatoid arthritis, unspecified: Secondary | ICD-10-CM

## 2015-04-05 DIAGNOSIS — J309 Allergic rhinitis, unspecified: Secondary | ICD-10-CM | POA: Diagnosis not present

## 2015-04-05 DIAGNOSIS — I10 Essential (primary) hypertension: Secondary | ICD-10-CM

## 2015-04-05 DIAGNOSIS — I509 Heart failure, unspecified: Secondary | ICD-10-CM | POA: Diagnosis not present

## 2015-05-13 ENCOUNTER — Encounter: Payer: Self-pay | Admitting: Adult Health
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
## 2015-05-14 ENCOUNTER — Non-Acute Institutional Stay (SKILLED_NURSING_FACILITY): Payer: Medicare Other | Admitting: Adult Health

## 2015-05-14 DIAGNOSIS — N319 Neuromuscular dysfunction of bladder, unspecified: Secondary | ICD-10-CM | POA: Diagnosis not present

## 2015-05-14 DIAGNOSIS — J309 Allergic rhinitis, unspecified: Secondary | ICD-10-CM

## 2015-05-14 DIAGNOSIS — M069 Rheumatoid arthritis, unspecified: Secondary | ICD-10-CM

## 2015-05-14 DIAGNOSIS — G8220 Paraplegia, unspecified: Secondary | ICD-10-CM | POA: Diagnosis not present

## 2015-05-14 DIAGNOSIS — I119 Hypertensive heart disease without heart failure: Secondary | ICD-10-CM | POA: Diagnosis not present

## 2015-05-14 DIAGNOSIS — I509 Heart failure, unspecified: Secondary | ICD-10-CM | POA: Diagnosis not present

## 2015-05-14 DIAGNOSIS — K3184 Gastroparesis: Secondary | ICD-10-CM

## 2015-05-28 ENCOUNTER — Encounter: Payer: Self-pay | Admitting: Adult Health
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
## 2015-06-25 ENCOUNTER — Non-Acute Institutional Stay (SKILLED_NURSING_FACILITY): Payer: Medicare Other | Admitting: Adult Health

## 2015-06-25 DIAGNOSIS — K3184 Gastroparesis: Secondary | ICD-10-CM

## 2015-06-25 DIAGNOSIS — C50212 Malignant neoplasm of upper-inner quadrant of left female breast: Secondary | ICD-10-CM | POA: Diagnosis not present

## 2015-06-25 DIAGNOSIS — I509 Heart failure, unspecified: Secondary | ICD-10-CM | POA: Diagnosis not present

## 2015-06-25 DIAGNOSIS — Z9013 Acquired absence of bilateral breasts and nipples: Secondary | ICD-10-CM | POA: Diagnosis not present

## 2015-06-25 DIAGNOSIS — G8220 Paraplegia, unspecified: Secondary | ICD-10-CM | POA: Diagnosis not present

## 2015-06-25 DIAGNOSIS — M069 Rheumatoid arthritis, unspecified: Secondary | ICD-10-CM | POA: Diagnosis not present

## 2015-06-25 DIAGNOSIS — I119 Hypertensive heart disease without heart failure: Secondary | ICD-10-CM

## 2015-06-25 DIAGNOSIS — K5901 Slow transit constipation: Secondary | ICD-10-CM | POA: Diagnosis not present

## 2015-06-26 LAB — BASIC METABOLIC PANEL
BUN: 12 mg/dL (ref 4–21)
Creatinine: 0.5 mg/dL (ref 0.5–1.1)
GLUCOSE: 105 mg/dL
Potassium: 3.6 mmol/L (ref 3.4–5.3)
Sodium: 137 mmol/L (ref 137–147)

## 2015-06-26 LAB — HEPATIC FUNCTION PANEL
ALT: 86 U/L — AB (ref 7–35)
AST: 46 U/L — AB (ref 13–35)
Alkaline Phosphatase: 80 U/L (ref 25–125)
BILIRUBIN, TOTAL: 0.5 mg/dL

## 2015-06-26 LAB — CBC AND DIFFERENTIAL
HCT: 43 % (ref 36–46)
HEMOGLOBIN: 14.4 g/dL (ref 12.0–16.0)
WBC: 6.2 10*3/mL

## 2015-06-28 ENCOUNTER — Ambulatory Visit (INDEPENDENT_AMBULATORY_CARE_PROVIDER_SITE_OTHER): Payer: Medicare Other | Admitting: Cardiology

## 2015-06-28 ENCOUNTER — Encounter: Payer: Self-pay | Admitting: Cardiology

## 2015-06-28 VITALS — BP 120/90 | HR 76 | Ht 65.0 in | Wt 168.0 lb

## 2015-06-28 DIAGNOSIS — I509 Heart failure, unspecified: Secondary | ICD-10-CM

## 2015-06-28 NOTE — Patient Instructions (Signed)
Your physician recommends that you schedule a follow-up appointment in: AS NEEDED  
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
## 2015-07-06 DIAGNOSIS — J90 Pleural effusion, not elsewhere classified: Secondary | ICD-10-CM | POA: Insufficient documentation

## 2015-07-06 DIAGNOSIS — M19011 Primary osteoarthritis, right shoulder: Secondary | ICD-10-CM | POA: Insufficient documentation

## 2015-07-06 DIAGNOSIS — K449 Diaphragmatic hernia without obstruction or gangrene: Secondary | ICD-10-CM | POA: Insufficient documentation

## 2015-07-15 ENCOUNTER — Encounter: Payer: Self-pay | Admitting: Adult Health

## 2015-07-15 DIAGNOSIS — I119 Hypertensive heart disease without heart failure: Secondary | ICD-10-CM | POA: Insufficient documentation
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
## 2015-07-25 ENCOUNTER — Encounter: Payer: Self-pay | Admitting: Adult Health

## 2015-07-25 DIAGNOSIS — Z9013 Acquired absence of bilateral breasts and nipples: Secondary | ICD-10-CM | POA: Insufficient documentation
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
## 2015-08-01 ENCOUNTER — Non-Acute Institutional Stay (SKILLED_NURSING_FACILITY): Payer: Medicare Other | Admitting: Adult Health

## 2015-08-01 DIAGNOSIS — I5032 Chronic diastolic (congestive) heart failure: Secondary | ICD-10-CM | POA: Diagnosis not present

## 2015-08-01 DIAGNOSIS — F32A Depression, unspecified: Secondary | ICD-10-CM

## 2015-08-01 DIAGNOSIS — M19011 Primary osteoarthritis, right shoulder: Secondary | ICD-10-CM

## 2015-08-01 DIAGNOSIS — N319 Neuromuscular dysfunction of bladder, unspecified: Secondary | ICD-10-CM

## 2015-08-01 DIAGNOSIS — K3184 Gastroparesis: Secondary | ICD-10-CM | POA: Diagnosis not present

## 2015-08-01 DIAGNOSIS — F329 Major depressive disorder, single episode, unspecified: Secondary | ICD-10-CM

## 2015-08-01 DIAGNOSIS — I119 Hypertensive heart disease without heart failure: Secondary | ICD-10-CM

## 2015-08-01 DIAGNOSIS — G8220 Paraplegia, unspecified: Secondary | ICD-10-CM | POA: Diagnosis not present

## 2015-08-01 DIAGNOSIS — M069 Rheumatoid arthritis, unspecified: Secondary | ICD-10-CM | POA: Diagnosis not present

## 2015-08-19 ENCOUNTER — Encounter: Payer: Self-pay | Admitting: Adult Health
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
## 2015-09-26 ENCOUNTER — Encounter: Payer: Self-pay | Admitting: Adult Health

## 2015-09-26 ENCOUNTER — Non-Acute Institutional Stay (SKILLED_NURSING_FACILITY): Payer: Medicare Other | Admitting: Adult Health

## 2015-09-26 DIAGNOSIS — K5901 Slow transit constipation: Secondary | ICD-10-CM | POA: Diagnosis not present

## 2015-09-26 DIAGNOSIS — M069 Rheumatoid arthritis, unspecified: Secondary | ICD-10-CM | POA: Diagnosis not present

## 2015-09-26 DIAGNOSIS — D509 Iron deficiency anemia, unspecified: Secondary | ICD-10-CM | POA: Diagnosis not present

## 2015-09-26 DIAGNOSIS — K3184 Gastroparesis: Secondary | ICD-10-CM

## 2015-09-26 DIAGNOSIS — I5032 Chronic diastolic (congestive) heart failure: Secondary | ICD-10-CM

## 2015-09-26 DIAGNOSIS — G8220 Paraplegia, unspecified: Secondary | ICD-10-CM

## 2015-09-26 DIAGNOSIS — I119 Hypertensive heart disease without heart failure: Secondary | ICD-10-CM | POA: Diagnosis not present
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
## 2015-10-31 ENCOUNTER — Non-Acute Institutional Stay (SKILLED_NURSING_FACILITY): Payer: Medicare Other | Admitting: Adult Health

## 2015-10-31 DIAGNOSIS — F329 Major depressive disorder, single episode, unspecified: Secondary | ICD-10-CM

## 2015-10-31 DIAGNOSIS — K219 Gastro-esophageal reflux disease without esophagitis: Secondary | ICD-10-CM | POA: Diagnosis not present

## 2015-10-31 DIAGNOSIS — M069 Rheumatoid arthritis, unspecified: Secondary | ICD-10-CM | POA: Diagnosis not present

## 2015-10-31 DIAGNOSIS — F32A Depression, unspecified: Secondary | ICD-10-CM

## 2015-10-31 DIAGNOSIS — N319 Neuromuscular dysfunction of bladder, unspecified: Secondary | ICD-10-CM | POA: Diagnosis not present

## 2015-10-31 DIAGNOSIS — K5901 Slow transit constipation: Secondary | ICD-10-CM

## 2015-10-31 DIAGNOSIS — G8220 Paraplegia, unspecified: Secondary | ICD-10-CM | POA: Diagnosis not present

## 2015-10-31 DIAGNOSIS — K3184 Gastroparesis: Secondary | ICD-10-CM

## 2015-10-31 DIAGNOSIS — Z9013 Acquired absence of bilateral breasts and nipples: Secondary | ICD-10-CM | POA: Diagnosis not present

## 2015-10-31 DIAGNOSIS — I119 Hypertensive heart disease without heart failure: Secondary | ICD-10-CM

## 2015-10-31 DIAGNOSIS — I5032 Chronic diastolic (congestive) heart failure: Secondary | ICD-10-CM | POA: Diagnosis not present

## 2015-11-01 ENCOUNTER — Encounter: Payer: Self-pay | Admitting: Adult Health
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
## 2015-11-25 ENCOUNTER — Non-Acute Institutional Stay (SKILLED_NURSING_FACILITY): Payer: Medicare Other | Admitting: Adult Health

## 2015-11-25 ENCOUNTER — Encounter: Payer: Self-pay | Admitting: Adult Health

## 2015-11-25 DIAGNOSIS — F329 Major depressive disorder, single episode, unspecified: Secondary | ICD-10-CM | POA: Diagnosis not present

## 2015-11-25 DIAGNOSIS — I5032 Chronic diastolic (congestive) heart failure: Secondary | ICD-10-CM

## 2015-11-25 DIAGNOSIS — I119 Hypertensive heart disease without heart failure: Secondary | ICD-10-CM | POA: Diagnosis not present

## 2015-11-25 DIAGNOSIS — M069 Rheumatoid arthritis, unspecified: Secondary | ICD-10-CM | POA: Diagnosis not present

## 2015-11-25 DIAGNOSIS — K3184 Gastroparesis: Secondary | ICD-10-CM

## 2015-11-25 DIAGNOSIS — K5901 Slow transit constipation: Secondary | ICD-10-CM | POA: Diagnosis not present

## 2015-11-25 DIAGNOSIS — J309 Allergic rhinitis, unspecified: Secondary | ICD-10-CM | POA: Diagnosis not present

## 2015-11-25 DIAGNOSIS — G8220 Paraplegia, unspecified: Secondary | ICD-10-CM | POA: Diagnosis not present

## 2015-11-25 DIAGNOSIS — K219 Gastro-esophageal reflux disease without esophagitis: Secondary | ICD-10-CM

## 2015-11-25 DIAGNOSIS — F32A Depression, unspecified: Secondary | ICD-10-CM
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
## 2015-11-27 LAB — HEPATIC FUNCTION PANEL
ALK PHOS: 79 U/L (ref 25–125)
ALT: 70 U/L — AB (ref 7–35)
AST: 41 U/L — AB (ref 13–35)

## 2015-11-28 ENCOUNTER — Encounter: Payer: Self-pay | Admitting: Adult Health

## 2015-11-28 ENCOUNTER — Non-Acute Institutional Stay (SKILLED_NURSING_FACILITY): Payer: Medicare Other | Admitting: Adult Health

## 2015-11-28 DIAGNOSIS — R739 Hyperglycemia, unspecified: Secondary | ICD-10-CM | POA: Diagnosis not present

## 2015-11-28 DIAGNOSIS — K5901 Slow transit constipation: Secondary | ICD-10-CM
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
## 2015-11-29 LAB — HEMOGLOBIN A1C: Hemoglobin A1C: 6

## 2015-12-03 DIAGNOSIS — R739 Hyperglycemia, unspecified: Secondary | ICD-10-CM | POA: Insufficient documentation

## 2015-12-25 ENCOUNTER — Encounter: Payer: Self-pay | Admitting: Adult Health

## 2015-12-26 ENCOUNTER — Encounter: Payer: Self-pay | Admitting: Adult Health

## 2015-12-26 ENCOUNTER — Non-Acute Institutional Stay (SKILLED_NURSING_FACILITY): Payer: Medicare Other | Admitting: Adult Health

## 2015-12-26 DIAGNOSIS — M069 Rheumatoid arthritis, unspecified: Secondary | ICD-10-CM | POA: Diagnosis not present

## 2015-12-26 DIAGNOSIS — I119 Hypertensive heart disease without heart failure: Secondary | ICD-10-CM

## 2015-12-26 DIAGNOSIS — G8220 Paraplegia, unspecified: Secondary | ICD-10-CM | POA: Diagnosis not present

## 2015-12-26 DIAGNOSIS — K3184 Gastroparesis: Secondary | ICD-10-CM

## 2015-12-26 DIAGNOSIS — D509 Iron deficiency anemia, unspecified: Secondary | ICD-10-CM | POA: Diagnosis not present

## 2015-12-26 DIAGNOSIS — Z9013 Acquired absence of bilateral breasts and nipples: Secondary | ICD-10-CM | POA: Diagnosis not present

## 2015-12-26 DIAGNOSIS — I5032 Chronic diastolic (congestive) heart failure: Secondary | ICD-10-CM

## 2015-12-26 DIAGNOSIS — R739 Hyperglycemia, unspecified: Secondary | ICD-10-CM
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
## 2016-01-14 ENCOUNTER — Encounter: Payer: Self-pay | Admitting: Internal Medicine

## 2016-01-14 ENCOUNTER — Non-Acute Institutional Stay (SKILLED_NURSING_FACILITY): Payer: Medicare Other | Admitting: Internal Medicine

## 2016-01-14 DIAGNOSIS — G8220 Paraplegia, unspecified: Secondary | ICD-10-CM | POA: Diagnosis not present

## 2016-01-14 DIAGNOSIS — J309 Allergic rhinitis, unspecified: Secondary | ICD-10-CM

## 2016-01-14 DIAGNOSIS — M069 Rheumatoid arthritis, unspecified: Secondary | ICD-10-CM

## 2016-01-14 DIAGNOSIS — R05 Cough: Secondary | ICD-10-CM | POA: Diagnosis not present

## 2016-01-14 DIAGNOSIS — R059 Cough, unspecified: Secondary | ICD-10-CM
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
## 2016-01-16 ENCOUNTER — Non-Acute Institutional Stay (SKILLED_NURSING_FACILITY): Payer: Medicare Other | Admitting: Adult Health

## 2016-01-16 DIAGNOSIS — L89153 Pressure ulcer of sacral region, stage 3: Secondary | ICD-10-CM | POA: Diagnosis not present

## 2016-01-31 ENCOUNTER — Encounter: Payer: Self-pay | Admitting: Adult Health

## 2016-01-31 ENCOUNTER — Non-Acute Institutional Stay (SKILLED_NURSING_FACILITY): Payer: Medicare Other | Admitting: Adult Health

## 2016-01-31 DIAGNOSIS — I119 Hypertensive heart disease without heart failure: Secondary | ICD-10-CM

## 2016-01-31 DIAGNOSIS — I5032 Chronic diastolic (congestive) heart failure: Secondary | ICD-10-CM

## 2016-01-31 DIAGNOSIS — G8220 Paraplegia, unspecified: Secondary | ICD-10-CM

## 2016-01-31 DIAGNOSIS — M19011 Primary osteoarthritis, right shoulder: Secondary | ICD-10-CM

## 2016-01-31 DIAGNOSIS — D509 Iron deficiency anemia, unspecified: Secondary | ICD-10-CM | POA: Diagnosis not present

## 2016-01-31 DIAGNOSIS — K3184 Gastroparesis: Secondary | ICD-10-CM | POA: Diagnosis not present

## 2016-01-31 DIAGNOSIS — F32A Depression, unspecified: Secondary | ICD-10-CM

## 2016-01-31 DIAGNOSIS — F329 Major depressive disorder, single episode, unspecified: Secondary | ICD-10-CM | POA: Diagnosis not present
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
## 2016-02-04 DIAGNOSIS — L89159 Pressure ulcer of sacral region, unspecified stage: Secondary | ICD-10-CM | POA: Insufficient documentation
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
## 2016-03-02 ENCOUNTER — Encounter: Payer: Self-pay | Admitting: Adult Health

## 2016-03-02 ENCOUNTER — Non-Acute Institutional Stay (SKILLED_NURSING_FACILITY): Payer: Medicare Other | Admitting: Adult Health

## 2016-03-02 DIAGNOSIS — J309 Allergic rhinitis, unspecified: Secondary | ICD-10-CM | POA: Diagnosis not present

## 2016-03-02 DIAGNOSIS — I119 Hypertensive heart disease without heart failure: Secondary | ICD-10-CM | POA: Diagnosis not present

## 2016-03-02 DIAGNOSIS — G8220 Paraplegia, unspecified: Secondary | ICD-10-CM | POA: Diagnosis not present

## 2016-03-02 DIAGNOSIS — M069 Rheumatoid arthritis, unspecified: Secondary | ICD-10-CM | POA: Diagnosis not present

## 2016-03-02 DIAGNOSIS — K5901 Slow transit constipation: Secondary | ICD-10-CM | POA: Diagnosis not present

## 2016-03-02 DIAGNOSIS — D509 Iron deficiency anemia, unspecified: Secondary | ICD-10-CM

## 2016-03-02 DIAGNOSIS — K3184 Gastroparesis: Secondary | ICD-10-CM | POA: Diagnosis not present

## 2016-03-02 DIAGNOSIS — I5032 Chronic diastolic (congestive) heart failure: Secondary | ICD-10-CM

## 2016-03-02 DIAGNOSIS — F329 Major depressive disorder, single episode, unspecified: Secondary | ICD-10-CM

## 2016-03-02 DIAGNOSIS — F32A Depression, unspecified: Secondary | ICD-10-CM
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
## 2016-03-20 LAB — BASIC METABOLIC PANEL WITH GFR
BUN: 11 mg/dL (ref 4–21)
Creatinine: 0.5 mg/dL (ref 0.5–1.1)
Glucose: 114 mg/dL
Potassium: 3.7 mmol/L (ref 3.4–5.3)
Sodium: 141 mmol/L (ref 137–147)

## 2016-04-03 ENCOUNTER — Non-Acute Institutional Stay (SKILLED_NURSING_FACILITY): Payer: Medicare Other | Admitting: Adult Health

## 2016-04-03 ENCOUNTER — Encounter: Payer: Self-pay | Admitting: Adult Health

## 2016-04-03 DIAGNOSIS — M069 Rheumatoid arthritis, unspecified: Secondary | ICD-10-CM

## 2016-04-03 DIAGNOSIS — J309 Allergic rhinitis, unspecified: Secondary | ICD-10-CM

## 2016-04-03 DIAGNOSIS — I119 Hypertensive heart disease without heart failure: Secondary | ICD-10-CM | POA: Diagnosis not present

## 2016-04-03 DIAGNOSIS — D509 Iron deficiency anemia, unspecified: Secondary | ICD-10-CM

## 2016-04-03 DIAGNOSIS — R739 Hyperglycemia, unspecified: Secondary | ICD-10-CM | POA: Diagnosis not present

## 2016-04-03 DIAGNOSIS — G8220 Paraplegia, unspecified: Secondary | ICD-10-CM

## 2016-04-03 DIAGNOSIS — I5032 Chronic diastolic (congestive) heart failure: Secondary | ICD-10-CM

## 2016-04-03 DIAGNOSIS — F32A Depression, unspecified: Secondary | ICD-10-CM

## 2016-04-03 DIAGNOSIS — F329 Major depressive disorder, single episode, unspecified: Secondary | ICD-10-CM | POA: Diagnosis not present
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
## 2016-05-04 ENCOUNTER — Encounter: Payer: Self-pay | Admitting: Adult Health

## 2016-05-04 ENCOUNTER — Non-Acute Institutional Stay (SKILLED_NURSING_FACILITY): Payer: Medicare Other | Admitting: Adult Health

## 2016-05-04 DIAGNOSIS — R739 Hyperglycemia, unspecified: Secondary | ICD-10-CM

## 2016-05-04 DIAGNOSIS — K5901 Slow transit constipation: Secondary | ICD-10-CM

## 2016-05-04 DIAGNOSIS — I119 Hypertensive heart disease without heart failure: Secondary | ICD-10-CM | POA: Diagnosis not present

## 2016-05-04 DIAGNOSIS — J302 Other seasonal allergic rhinitis: Secondary | ICD-10-CM | POA: Diagnosis not present

## 2016-05-04 DIAGNOSIS — G8220 Paraplegia, unspecified: Secondary | ICD-10-CM

## 2016-05-04 DIAGNOSIS — D509 Iron deficiency anemia, unspecified: Secondary | ICD-10-CM

## 2016-05-04 DIAGNOSIS — L89153 Pressure ulcer of sacral region, stage 3: Secondary | ICD-10-CM | POA: Diagnosis not present

## 2016-05-04 DIAGNOSIS — M069 Rheumatoid arthritis, unspecified: Secondary | ICD-10-CM | POA: Diagnosis not present

## 2016-05-04 DIAGNOSIS — K219 Gastro-esophageal reflux disease without esophagitis: Secondary | ICD-10-CM | POA: Diagnosis not present

## 2016-05-04 DIAGNOSIS — I5032 Chronic diastolic (congestive) heart failure: Secondary | ICD-10-CM
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
## 2016-05-27 ENCOUNTER — Other Ambulatory Visit: Payer: Self-pay | Admitting: *Deleted

## 2016-05-27 MED ORDER — HYDROCODONE-ACETAMINOPHEN 7.5-325 MG PO TABS: ORAL_TABLET | ORAL | 0 refills | Status: DC

## 2016-05-27 NOTE — Telephone Encounter (Signed)
[facility] #: [phone] Fax#: [phone]

## 2016-05-27 NOTE — Telephone Encounter (Signed)
[facility] [facility] #: [phone] Fax#: [phone]  

## 2016-06-04 ENCOUNTER — Non-Acute Institutional Stay (SKILLED_NURSING_FACILITY): Payer: Medicare Other | Admitting: Adult Health

## 2016-06-04 ENCOUNTER — Encounter: Payer: Self-pay | Admitting: Adult Health

## 2016-06-04 DIAGNOSIS — R739 Hyperglycemia, unspecified: Secondary | ICD-10-CM | POA: Diagnosis not present

## 2016-06-04 DIAGNOSIS — L89153 Pressure ulcer of sacral region, stage 3: Secondary | ICD-10-CM

## 2016-06-04 DIAGNOSIS — M069 Rheumatoid arthritis, unspecified: Secondary | ICD-10-CM

## 2016-06-04 DIAGNOSIS — I119 Hypertensive heart disease without heart failure: Secondary | ICD-10-CM

## 2016-06-04 DIAGNOSIS — K219 Gastro-esophageal reflux disease without esophagitis: Secondary | ICD-10-CM | POA: Diagnosis not present

## 2016-06-04 DIAGNOSIS — G8220 Paraplegia, unspecified: Secondary | ICD-10-CM

## 2016-06-04 DIAGNOSIS — I5032 Chronic diastolic (congestive) heart failure: Secondary | ICD-10-CM
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
## 2016-06-05 LAB — BASIC METABOLIC PANEL
BUN: 14 mg/dL (ref 4–21)
CREATININE: 0.5 mg/dL (ref 0.5–1.1)
Glucose: 99 mg/dL
POTASSIUM: 3.3 mmol/L — AB (ref 3.4–5.3)
Sodium: 141 mmol/L (ref 137–147)

## 2016-06-05 LAB — CBC AND DIFFERENTIAL
HCT: 45 % (ref 36–46)
HEMOGLOBIN: 14.4 g/dL (ref 12.0–16.0)
PLATELETS: 173 10*3/uL (ref 150–399)
WBC: 6 10*3/mL

## 2016-06-05 LAB — HEMOGLOBIN A1C: HEMOGLOBIN A1C: 6.3

## 2016-06-10 ENCOUNTER — Inpatient Hospital Stay (HOSPITAL_COMMUNITY): Payer: Medicare Other

## 2016-06-10 ENCOUNTER — Ambulatory Visit (INDEPENDENT_AMBULATORY_CARE_PROVIDER_SITE_OTHER): Payer: Medicare Other | Admitting: Neurology

## 2016-06-10 ENCOUNTER — Inpatient Hospital Stay (HOSPITAL_COMMUNITY)
Admission: EM | Admit: 2016-06-10 | Discharge: 2016-06-15 | DRG: 871 | Disposition: A | Discharge status: Skilled Nursing Facility | Payer: Medicare Other | Attending: Internal Medicine | Admitting: Internal Medicine

## 2016-06-10 ENCOUNTER — Other Ambulatory Visit: Payer: Self-pay | Admitting: *Deleted

## 2016-06-10 ENCOUNTER — Encounter: Payer: Self-pay | Admitting: Neurology

## 2016-06-10 ENCOUNTER — Emergency Department (HOSPITAL_COMMUNITY): Payer: Medicare Other

## 2016-06-10 ENCOUNTER — Encounter (HOSPITAL_COMMUNITY): Payer: Self-pay

## 2016-06-10 VITALS — BP 104/70 | HR 80 | Temp 98.1°F | Ht 65.0 in

## 2016-06-10 DIAGNOSIS — A400 Sepsis due to streptococcus, group A: Secondary | ICD-10-CM | POA: Diagnosis not present

## 2016-06-10 DIAGNOSIS — Z853 Personal history of malignant neoplasm of breast: Secondary | ICD-10-CM

## 2016-06-10 DIAGNOSIS — G8220 Paraplegia, unspecified: Secondary | ICD-10-CM | POA: Diagnosis not present

## 2016-06-10 DIAGNOSIS — Z8249 Family history of ischemic heart disease and other diseases of the circulatory system: Secondary | ICD-10-CM

## 2016-06-10 DIAGNOSIS — Z87891 Personal history of nicotine dependence: Secondary | ICD-10-CM

## 2016-06-10 DIAGNOSIS — A419 Sepsis, unspecified organism: Principal | ICD-10-CM | POA: Diagnosis present

## 2016-06-10 DIAGNOSIS — Z993 Dependence on wheelchair: Secondary | ICD-10-CM

## 2016-06-10 DIAGNOSIS — B9620 Unspecified Escherichia coli [E. coli] as the cause of diseases classified elsewhere: Secondary | ICD-10-CM | POA: Diagnosis present

## 2016-06-10 DIAGNOSIS — Z66 Do not resuscitate: Secondary | ICD-10-CM | POA: Diagnosis present

## 2016-06-10 DIAGNOSIS — L03312 Cellulitis of back [any part except buttock]: Secondary | ICD-10-CM | POA: Diagnosis present

## 2016-06-10 DIAGNOSIS — B955 Unspecified streptococcus as the cause of diseases classified elsewhere: Secondary | ICD-10-CM | POA: Diagnosis present

## 2016-06-10 DIAGNOSIS — K219 Gastro-esophageal reflux disease without esophagitis: Secondary | ICD-10-CM | POA: Diagnosis present

## 2016-06-10 DIAGNOSIS — I110 Hypertensive heart disease with heart failure: Secondary | ICD-10-CM | POA: Diagnosis present

## 2016-06-10 DIAGNOSIS — R739 Hyperglycemia, unspecified: Secondary | ICD-10-CM | POA: Diagnosis present

## 2016-06-10 DIAGNOSIS — I509 Heart failure, unspecified: Secondary | ICD-10-CM

## 2016-06-10 DIAGNOSIS — N319 Neuromuscular dysfunction of bladder, unspecified: Secondary | ICD-10-CM | POA: Diagnosis present

## 2016-06-10 DIAGNOSIS — R748 Abnormal levels of other serum enzymes: Secondary | ICD-10-CM | POA: Diagnosis not present

## 2016-06-10 DIAGNOSIS — M623 Immobility syndrome (paraplegic): Secondary | ICD-10-CM | POA: Diagnosis present

## 2016-06-10 DIAGNOSIS — E876 Hypokalemia: Secondary | ICD-10-CM | POA: Diagnosis present

## 2016-06-10 DIAGNOSIS — L89153 Pressure ulcer of sacral region, stage 3: Secondary | ICD-10-CM | POA: Diagnosis present

## 2016-06-10 DIAGNOSIS — K449 Diaphragmatic hernia without obstruction or gangrene: Secondary | ICD-10-CM | POA: Diagnosis present

## 2016-06-10 DIAGNOSIS — K7200 Acute and subacute hepatic failure without coma: Secondary | ICD-10-CM | POA: Diagnosis present

## 2016-06-10 DIAGNOSIS — I5032 Chronic diastolic (congestive) heart failure: Secondary | ICD-10-CM | POA: Diagnosis present

## 2016-06-10 DIAGNOSIS — Z9013 Acquired absence of bilateral breasts and nipples: Secondary | ICD-10-CM | POA: Diagnosis not present

## 2016-06-10 DIAGNOSIS — Z981 Arthrodesis status: Secondary | ICD-10-CM | POA: Diagnosis not present

## 2016-06-10 DIAGNOSIS — M069 Rheumatoid arthritis, unspecified: Secondary | ICD-10-CM | POA: Diagnosis present

## 2016-06-10 DIAGNOSIS — N390 Urinary tract infection, site not specified: Secondary | ICD-10-CM | POA: Diagnosis present

## 2016-06-10 DIAGNOSIS — Z79899 Other long term (current) drug therapy: Secondary | ICD-10-CM

## 2016-06-10 DIAGNOSIS — M24569 Contracture, unspecified knee: Secondary | ICD-10-CM | POA: Diagnosis not present

## 2016-06-10 DIAGNOSIS — L89159 Pressure ulcer of sacral region, unspecified stage: Secondary | ICD-10-CM | POA: Diagnosis present

## 2016-06-10 LAB — I-STAT BETA HCG BLOOD, ED (MC, WL, AP ONLY)

## 2016-06-10 LAB — COMPREHENSIVE METABOLIC PANEL
ALT: 97 U/L — ABNORMAL HIGH (ref 14–54)
AST: 101 U/L — AB (ref 15–41)
Albumin: 3.6 g/dL (ref 3.5–5.0)
Alkaline Phosphatase: 78 U/L (ref 38–126)
Anion gap: 12 (ref 5–15)
BUN: 16 mg/dL (ref 6–20)
CHLORIDE: 100 mmol/L — AB (ref 101–111)
CO2: 22 mmol/L (ref 22–32)
Calcium: 9.5 mg/dL (ref 8.9–10.3)
Creatinine, Ser: 0.85 mg/dL (ref 0.44–1.00)
Glucose, Bld: 172 mg/dL — ABNORMAL HIGH (ref 65–99)
POTASSIUM: 4.7 mmol/L (ref 3.5–5.1)
Sodium: 134 mmol/L — ABNORMAL LOW (ref 135–145)
TOTAL PROTEIN: 8.3 g/dL — AB (ref 6.5–8.1)
Total Bilirubin: 1.2 mg/dL (ref 0.3–1.2)

## 2016-06-10 LAB — URINALYSIS, ROUTINE W REFLEX MICROSCOPIC
Bilirubin Urine: NEGATIVE
GLUCOSE, UA: NEGATIVE mg/dL
HGB URINE DIPSTICK: NEGATIVE
KETONES UR: NEGATIVE mg/dL
Nitrite: POSITIVE — AB
PROTEIN: NEGATIVE mg/dL
Specific Gravity, Urine: 1.023 (ref 1.005–1.030)
pH: 5.5 (ref 5.0–8.0)

## 2016-06-10 LAB — CBC WITH DIFFERENTIAL/PLATELET
BASOS ABS: 0 10*3/uL (ref 0.0–0.1)
Basophils Relative: 0 %
EOS ABS: 0 10*3/uL (ref 0.0–0.7)
EOS PCT: 0 %
HCT: 44.6 % (ref 36.0–46.0)
Hemoglobin: 15.1 g/dL — ABNORMAL HIGH (ref 12.0–15.0)
LYMPHS ABS: 0.8 10*3/uL (ref 0.7–4.0)
LYMPHS PCT: 6 %
MCH: 28.4 pg (ref 26.0–34.0)
MCHC: 33.9 g/dL (ref 30.0–36.0)
MCV: 83.8 fL (ref 78.0–100.0)
Monocytes Absolute: 0.5 10*3/uL (ref 0.1–1.0)
Monocytes Relative: 3 %
Neutro Abs: 12.8 10*3/uL — ABNORMAL HIGH (ref 1.7–7.7)
Neutrophils Relative %: 91 %
PLATELETS: 182 10*3/uL (ref 150–400)
RBC: 5.32 MIL/uL — AB (ref 3.87–5.11)
RDW: 14 % (ref 11.5–15.5)
WBC: 14.1 10*3/uL — AB (ref 4.0–10.5)

## 2016-06-10 LAB — URINE MICROSCOPIC-ADD ON

## 2016-06-10 LAB — I-STAT CG4 LACTIC ACID, ED
Lactic Acid, Venous: 3.29 mmol/L (ref 0.5–1.9)
Lactic Acid, Venous: 2.03 mmol/L (ref 0.5–1.9)

## 2016-06-10 MED ORDER — SODIUM CHLORIDE 0.9 % IV BOLUS (SEPSIS)
250.0000 mL | Freq: Once | INTRAVENOUS | Status: AC
  Administered 2016-06-10: 250 mL via INTRAVENOUS

## 2016-06-10 MED ORDER — VANCOMYCIN HCL IN DEXTROSE 1-5 GM/200ML-% IV SOLN: 1000.0000 mg | Freq: Once | INTRAVENOUS | Status: DC

## 2016-06-10 MED ORDER — PIPERACILLIN-TAZOBACTAM 3.375 G IVPB 30 MIN
3.3750 g | Freq: Once | INTRAVENOUS | Status: AC
  Administered 2016-06-10: 3.375 g via INTRAVENOUS
  Filled 2016-06-10: qty 50

## 2016-06-10 MED ORDER — ACETAMINOPHEN 325 MG PO TABS
650.0000 mg | ORAL_TABLET | Freq: Once | ORAL | Status: AC
  Administered 2016-06-10: 650 mg via ORAL
  Filled 2016-06-10: qty 2

## 2016-06-10 MED ORDER — VANCOMYCIN HCL IN DEXTROSE 750-5 MG/150ML-% IV SOLN
750.0000 mg | Freq: Two times a day (BID) | INTRAVENOUS | Status: DC
  Administered 2016-06-11: 750 mg via INTRAVENOUS
  Filled 2016-06-10 (×3): qty 150

## 2016-06-10 MED ORDER — VANCOMYCIN HCL 10 G IV SOLR
1500.0000 mg | Freq: Once | INTRAVENOUS | Status: AC
  Administered 2016-06-10: 1500 mg via INTRAVENOUS
  Filled 2016-06-10: qty 1500

## 2016-06-10 MED ORDER — SODIUM CHLORIDE 0.9 % IV BOLUS (SEPSIS)
1000.0000 mL | Freq: Once | INTRAVENOUS | Status: AC
  Administered 2016-06-10: 1000 mL via INTRAVENOUS

## 2016-06-10 MED ORDER — PIPERACILLIN-TAZOBACTAM 3.375 G IVPB
3.3750 g | Freq: Three times a day (TID) | INTRAVENOUS | Status: DC
  Administered 2016-06-11 – 2016-06-13 (×7): 3.375 g via INTRAVENOUS
  Filled 2016-06-10 (×11): qty 50

## 2016-06-10 MED ORDER — IOPAMIDOL (ISOVUE-300) INJECTION 61%
INTRAVENOUS | Status: AC
  Administered 2016-06-10: 100 mL
  Filled 2016-06-10: qty 100

## 2016-06-10 NOTE — ED Notes (Signed)
Vancomycin just arrived from pharmacy

## 2016-06-10 NOTE — Patient Instructions (Addendum)
We will send a referral to Physical Medicine and Rehabilitation specialist for ongoing care.  If there are no other options for your contractures, recommend getting a tilt and recline powerchair.   Please call my office if we can be of further assistance.
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
## 2016-06-10 NOTE — Progress Notes (Signed)
Pharmacy Antibiotic Note  [name] is a 65 y.o. female admitted on 06/10/2016 with cellulitis.  Pharmacy has been consulted for vancomycin and Zosyn dosing.  Day #1 of abx for cellulitis. Lactic acid elevated at 3.29. Tmax of 104.6, WBC 14.1. SCr stable, CrCl ~37ml/min.  Plan: Give vancomycin 1.5g IV x 1, vancomycin 750mg  IV Q12 Start Zosyn 3.375 gm IV q8h (4 hour infusion) Monitor clinical picture, renal function, VT prn F/U C&S, abx deescalation / LOT  Height: 5\' 6"  (167.6 cm) Weight: 163 lb (73.9 kg) IBW/kg (Calculated) : 59.3  Temp (24hrs), Avg:101.4 F (38.6 C), Min:98.1 F (36.7 C), Max:104.6 F (40.3 C)  No results for input(s): WBC, CREATININE, LATICACIDVEN, VANCOTROUGH, VANCOPEAK, VANCORANDOM, GENTTROUGH, GENTPEAK, GENTRANDOM, TOBRATROUGH, TOBRAPEAK, TOBRARND, AMIKACINPEAK, AMIKACINTROU, AMIKACIN in the last 168 hours.  CrCl cannot be calculated (Patient's most recent lab result is older than the maximum 21 days allowed.).    No Known Allergies  Antimicrobials this admission: Vancomycin 11/15 >>  Zosyn 11/15 >>   Dose adjustments this admission: n/a  Microbiology results: 11/15 BCx: sent 11/15 UCx: sent   Thank you for allowing pharmacy to be a part of this patient's care.  Elenor Quinones, PharmD, BCPS Clinical Pharmacist Pager [phone] 06/10/2016 7:29 PM

## 2016-06-10 NOTE — ED Provider Notes (Signed)
[facility] DEPT Provider Note   CSN: [id number] Arrival date & time: 06/10/16  [place]     History   Chief Complaint Chief Complaint  Patient presents with  . Wound Infection  . Code Sepsis    HPI [name] is a 65 y.o. female.  The history is provided by the patient. No language interpreter was used.   [name] is a 65 y.o. female who presents to the Emergency Department complaining of fever.  She presents from [place] [facility] home for evaluation of fever that started today. 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 She has chronic decubitus ulcers on her back and sacrum. She was feeling well this morning when this afternoon she developed shaking chills and fever to 102 at her facility. She reports associated cough and mild nausea. Symptoms are severe and constant in nature. Past Medical History:  Diagnosis Date  . Anemia 10/12/2008  . CHF (congestive heart failure) ([place])   . Diaphragmatic hernia without mention of obstruction or gangrene 08/29/2010  . Edema 05/20/2009  . Gastroparesis 10/05/2010  . GERD (gastroesophageal reflux disease) 02/02/2009  . Hypertension 10/12/2008  . Hypotension, unspecified 05/20/2009  . Immobility syndrome (paraplegic)   . Iron deficiency anemia, unspecified 10/12/2008  . Leiomyoma of uterus   . Malignant neoplasm of breast (female), unspecified site 10/05/2010   2003, 2013   . Rheumatoid arthritis ([place]) 01/17/2009    Patient Active Problem List   Diagnosis Date Noted  . Decubitus ulcer of coccyx 02/04/2016  . Hyperglycemia 12/03/2015  . H/O bilateral mastectomy 07/25/2015  . Benign hypertensive heart disease without heart failure 07/15/2015  . Hiatal hernia 07/06/2015  . 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 History of breast cancer 08/21/2014  . Adynamic ileus ([place]) 07/22/2014  . Elevated liver enzymes 07/22/2014  . CHF (congestive heart failure) ([place]) 07/05/2014  .  Gastroparesis 07/05/2014  . Microcytic anemia 01/05/2014  . Insomnia 07/14/2013  . Edema 05/01/2013  . Rheumatoid arthritis ([place]) 02/23/2013  . GERD (gastroesophageal reflux disease) 02/23/2013  . Neurogenic bladder 02/23/2013  . Paraplegia ([place]) 02/23/2013  . Depression 02/23/2013  . Allergic rhinitis 02/23/2013  . Constipation 02/23/2013  . 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[place]) 09/24/2011  . Breast cancer, left breast ([place]) 09/08/2011    Past Surgical History:  Procedure Laterality Date  . [place]  . BREAST SURGERY  2003   Bilateral mastectomy  . PRESSURE ULCER DEBRIDEMENT  2009   on back  . WOUND DEBRIDEMENT  09/02/2008   Large sacral back open wound    OB History    No data available       Home Medications    Prior to Admission medications   Medication Sig Start Date End Date Taking? Authorizing Provider  baclofen (LIORESAL) 10 MG tablet Take 10 mg by mouth 2 (two) times daily.     Historical Provider, MD  bisacodyl (DULCOLAX) 10 MG suppository Place 10 mg rectally every other day.     Historical Provider, MD  calcium-vitamin D (OYSTER CALCIUM 500 + D) 500-200 MG-UNIT per tablet Take 1 tablet by mouth 2 (two) times daily.     Historical Provider, MD  carvedilol (COREG) 6.25 MG tablet Take 6.25 mg by mouth daily. Hold if systolic BP is less than 941    Historical Provider, MD  cetirizine (ZYRTEC ALLERGY) 10 MG tablet Take 10 mg by mouth daily.  Historical Provider, MD  Cranberry 425 MG CAPS Take 425 mg by mouth daily.    Historical Provider, MD  diphenhydrAMINE (BENADRYL) 25 MG tablet Take 25 mg by mouth every 8 (eight) hours as needed for itching.    Historical Provider, MD  escitalopram (LEXAPRO) 5 MG tablet Take 15 mg by mouth daily.    Historical Provider, MD  esomeprazole (NEXIUM) 40 MG capsule Take 40 mg by mouth daily before breakfast.    Historical Provider, MD  fluticasone (FLONASE) 50 MCG/ACT nasal spray Place 1  spray into both nostrils daily.    Historical Provider, MD  HYDROcodone-acetaminophen (NORCO) 7.5-325 MG tablet Take one tablet by mouth every 4 hours as needed for pain DO NOT EXCEED 3 GM APAP FROM ALL SOURCES 24HR 05/27/16   Estill Dooms, MD  hydroxychloroquine (PLAQUENIL) 200 MG tablet Take 200 mg by mouth daily.     Historical Provider, MD  Linaclotide Rolan Lipa) 290 MCG CAPS capsule Take 1 capsule (290 mcg total) by mouth daily. 07/22/14   Gerlene Fee, NP  losartan (COZAAR) 25 MG tablet Take 25 mg by mouth daily.    Historical Provider, MD  methocarbamol (ROBAXIN) 500 MG tablet Take 500 mg by mouth every 8 (eight) hours as needed for muscle spasms.     Historical Provider, MD  metoCLOPramide (REGLAN) 5 MG tablet Take 5 mg by mouth 3 (three) times daily.    Historical Provider, MD  Multiple Vitamins-Minerals (DECUBI-VITE) CAPS Take 1 capsule by mouth daily.    Historical Provider, MD  Prenatal Multivit-Min-Fe-FA (PRENATAL/IRON) TABS Take 1 tablet by mouth daily.     Historical Provider, MD  Salicylic Acid (NEUTROGENA OIL-FREE ACNE [place] EX) Apply topically every 8 (eight) hours as needed. Apply to face topically    Historical Provider, MD  sennosides-docusate sodium (SENOKOT-S) 8.6-50 MG tablet Take 2 tablets by mouth at bedtime.    Historical Provider, MD  torsemide (DEMADEX) 10 MG tablet Take 10 mg by mouth daily.    Historical Provider, MD  traZODone (DESYREL) 50 MG tablet Take 50 mg by mouth at bedtime.     Historical Provider, MD    Family History Family History  Problem Relation Age of Onset  . Stroke Mother   . Hypertension Mother   . Cancer Maternal Aunt     unsure of what kind  . Hypertension Maternal Aunt   . Hypertension Maternal Uncle     Social History Social History  Substance Use Topics  . Smoking status: Former Smoker    Quit date: 07/28/1995  . Smokeless tobacco: Never Used  . Alcohol use No     Allergies   Patient has no known allergies.   Review of  Systems Review of Systems  All other systems reviewed and are negative.    Physical Exam Updated Vital Signs BP (!) 96/42 (BP Location: Right Arm)   Pulse (!) 126   Temp (!) 104.6 F (40.3 C) (Rectal)   Resp 20   Ht 5' 6"  (1.676 m)   Wt 163 lb (73.9 kg)   SpO2 99%   BMI 26.31 kg/m   Physical Exam  Constitutional: She is oriented to person, place, and time. She appears well-developed and well-nourished.  HENT:  Head: Normocephalic and atraumatic.  Cardiovascular: Regular rhythm.   No murmur heard. tachycardic  Pulmonary/Chest:  Tachypnea with decreased air movement in bilateral bases.  Abdominal: Soft. There is no tenderness. There is no rebound and no guarding.  Musculoskeletal: She exhibits no edema  or tenderness.  Neurological: She is alert and oriented to person, place, and time.  Skin:  Erythematous face. Chronic appearing decubitus wounds to the thoracic and lumbar midline back as well as sacrum. There is streaking erythema to the mid thoracic and lower lumbar region on the left side of the wounds. Scattered petechiae on the left calf and foot.  Psychiatric: She has a normal mood and affect. Her behavior is normal.  Nursing note and vitals reviewed.    ED Treatments / Results  Labs (all labs ordered are listed, but only abnormal results are displayed) Labs Reviewed  CULTURE, BLOOD (ROUTINE X 2)  CULTURE, BLOOD (ROUTINE X 2)  URINE CULTURE  COMPREHENSIVE METABOLIC PANEL  CBC WITH DIFFERENTIAL/PLATELET  URINALYSIS, ROUTINE W REFLEX MICROSCOPIC (NOT AT [facility])  I-STAT CG4 LACTIC ACID, ED    EKG  EKG Interpretation None       Radiology No results found.  Procedures Procedures (including critical care time)  Medications Ordered in ED Medications  acetaminophen (TYLENOL) tablet 650 mg (not administered)  sodium chloride 0.9 % bolus 1,000 mL (not administered)    And  sodium chloride 0.9 % bolus 1,000 mL (not administered)    And  sodium chloride 0.9  % bolus 250 mL (not administered)  piperacillin-tazobactam (ZOSYN) IVPB 3.375 g (not administered)  vancomycin (VANCOCIN) IVPB 1000 mg/200 mL premix (not administered)     Initial Impression / Assessment and Plan / ED Course  I have reviewed the triage vital signs and the nursing notes.  Pertinent labs & imaging results that were available during my care of the patient were reviewed by me and considered in my medical decision making (see chart for details).  Clinical Course     Pt with hx/o spinal cord injury here with fever.  Decubitus wound is concerning for some developing cellulitis to the left side of her back, UA is also concerning for UTI.  She was started on broad spectrum abx and IVF in the ED.  Following IVF her heart rate and lactate improved.  CXR concerning for retrocardiac opacity - CT chest/abd/pelvis obtained to further evaluate chest opacity and potential intra-abdominal source of infection.  Hospitalist consulted for admission for further treatment and patient was updated of findings of studies and recommendation for admission for further treatment.    Final Clinical Impressions(s) / ED Diagnoses   Final diagnoses:  None    New Prescriptions New Prescriptions   No medications on file     Quintella Reichert, MD 06/11/16 [phone]

## 2016-06-10 NOTE — Progress Notes (Unsigned)
r 

## 2016-06-10 NOTE — ED Notes (Signed)
Pt given food per edp 
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
## 2016-06-11 DIAGNOSIS — L03818 Cellulitis of other sites: Secondary | ICD-10-CM

## 2016-06-11 DIAGNOSIS — N390 Urinary tract infection, site not specified: Secondary | ICD-10-CM

## 2016-06-11 DIAGNOSIS — A400 Sepsis due to streptococcus, group A: Secondary | ICD-10-CM

## 2016-06-11 LAB — BLOOD CULTURE ID PANEL (REFLEXED)
ACINETOBACTER BAUMANNII: NOT DETECTED
CANDIDA ALBICANS: NOT DETECTED
Candida glabrata: NOT DETECTED
Candida krusei: NOT DETECTED
Candida parapsilosis: NOT DETECTED
Candida tropicalis: NOT DETECTED
ENTEROBACTERIACEAE SPECIES: NOT DETECTED
ENTEROCOCCUS SPECIES: NOT DETECTED
Enterobacter cloacae complex: NOT DETECTED
Escherichia coli: NOT DETECTED
HAEMOPHILUS INFLUENZAE: NOT DETECTED
Klebsiella oxytoca: NOT DETECTED
Klebsiella pneumoniae: NOT DETECTED
LISTERIA MONOCYTOGENES: NOT DETECTED
NEISSERIA MENINGITIDIS: NOT DETECTED
Proteus species: NOT DETECTED
Pseudomonas aeruginosa: NOT DETECTED
STREPTOCOCCUS AGALACTIAE: NOT DETECTED
STREPTOCOCCUS PNEUMONIAE: NOT DETECTED
STREPTOCOCCUS PYOGENES: DETECTED — AB
STREPTOCOCCUS SPECIES: DETECTED — AB
Serratia marcescens: NOT DETECTED
Staphylococcus aureus (BCID): NOT DETECTED
Staphylococcus species: NOT DETECTED

## 2016-06-11 LAB — CBC
HEMATOCRIT: 36.9 % (ref 36.0–46.0)
HEMOGLOBIN: 12.3 g/dL (ref 12.0–15.0)
MCH: 27.9 pg (ref 26.0–34.0)
MCHC: 33.3 g/dL (ref 30.0–36.0)
MCV: 83.7 fL (ref 78.0–100.0)
Platelets: 132 10*3/uL — ABNORMAL LOW (ref 150–400)
RBC: 4.41 MIL/uL (ref 3.87–5.11)
RDW: 14.2 % (ref 11.5–15.5)
WBC: 11.2 10*3/uL — ABNORMAL HIGH (ref 4.0–10.5)

## 2016-06-11 LAB — BASIC METABOLIC PANEL
ANION GAP: 8 (ref 5–15)
BUN: 9 mg/dL (ref 6–20)
CALCIUM: 8.7 mg/dL — AB (ref 8.9–10.3)
CHLORIDE: 108 mmol/L (ref 101–111)
CO2: 22 mmol/L (ref 22–32)
Creatinine, Ser: 0.64 mg/dL (ref 0.44–1.00)
GFR calc non Af Amer: >60 mL/min (ref >60)
Glucose, Bld: 152 mg/dL — ABNORMAL HIGH (ref 65–99)
POTASSIUM: 3.2 mmol/L — AB (ref 3.5–5.1)
Sodium: 138 mmol/L (ref 135–145)

## 2016-06-11 LAB — LACTIC ACID, PLASMA
Lactic Acid, Venous: 2.8 mmol/L (ref 0.5–1.9)
Lactic Acid, Venous: 1.6 mmol/L (ref 0.5–1.9)

## 2016-06-11 LAB — APTT: aPTT: 36 seconds (ref 24–36)

## 2016-06-11 LAB — INFLUENZA PANEL BY PCR (TYPE A & B)
INFLBPCR: NEGATIVE
Influenza A By PCR: NEGATIVE

## 2016-06-11 LAB — GLUCOSE, CAPILLARY
GLUCOSE-CAPILLARY: 124 mg/dL — AB (ref 65–99)
GLUCOSE-CAPILLARY: 111 mg/dL — AB (ref 65–99)
Glucose-Capillary: 209 mg/dL — ABNORMAL HIGH (ref 65–99)
Glucose-Capillary: 143 mg/dL — ABNORMAL HIGH (ref 65–99)

## 2016-06-11 LAB — PROCALCITONIN: Procalcitonin: 3.51 ng/mL

## 2016-06-11 LAB — PROTIME-INR
INR: 1.19
Prothrombin Time: 15.2 seconds (ref 11.4–15.2)

## 2016-06-11 LAB — MRSA PCR SCREENING: MRSA BY PCR: NEGATIVE

## 2016-06-11 MED ORDER — SODIUM CHLORIDE 0.9 % IV BOLUS (SEPSIS)
500.0000 mL | Freq: Once | INTRAVENOUS | Status: AC
  Administered 2016-06-11: 500 mL via INTRAVENOUS

## 2016-06-11 MED ORDER — PANTOPRAZOLE SODIUM 40 MG PO TBEC
80.0000 mg | DELAYED_RELEASE_TABLET | Freq: Every day | ORAL | Status: DC
  Administered 2016-06-11 – 2016-06-15 (×5): 80 mg via ORAL
  Filled 2016-06-11 (×6): qty 2

## 2016-06-11 MED ORDER — POTASSIUM CHLORIDE CRYS ER 20 MEQ PO TBCR
20.0000 meq | EXTENDED_RELEASE_TABLET | Freq: Once | ORAL | Status: AC
  Administered 2016-06-11: 20 meq via ORAL
  Filled 2016-06-11: qty 1

## 2016-06-11 MED ORDER — LACTATED RINGERS IV SOLN
INTRAVENOUS | Status: DC
  Administered 2016-06-11: 03:00:00 via INTRAVENOUS
  Administered 2016-06-12: 1 mL via INTRAVENOUS

## 2016-06-11 MED ORDER — DIPHENHYDRAMINE HCL 25 MG PO CAPS: 25.0000 mg | ORAL_CAPSULE | Freq: Three times a day (TID) | ORAL | Status: DC | PRN

## 2016-06-11 MED ORDER — ESCITALOPRAM OXALATE 10 MG PO TABS
15.0000 mg | ORAL_TABLET | Freq: Every day | ORAL | Status: DC
  Administered 2016-06-11 – 2016-06-15 (×5): 15 mg via ORAL
  Filled 2016-06-11 (×5): qty 2

## 2016-06-11 MED ORDER — HYDROXYCHLOROQUINE SULFATE 200 MG PO TABS
200.0000 mg | ORAL_TABLET | Freq: Every day | ORAL | Status: DC
  Administered 2016-06-11 – 2016-06-15 (×5): 200 mg via ORAL
  Filled 2016-06-11 (×5): qty 1

## 2016-06-11 MED ORDER — LOSARTAN POTASSIUM 50 MG PO TABS
25.0000 mg | ORAL_TABLET | Freq: Every day | ORAL | Status: DC
  Filled 2016-06-11: qty 1

## 2016-06-11 MED ORDER — ADULT MULTIVITAMIN W/MINERALS CH
1.0000 | ORAL_TABLET | Freq: Every day | ORAL | Status: DC
  Administered 2016-06-11 – 2016-06-15 (×5): 1 via ORAL
  Filled 2016-06-11 (×5): qty 1

## 2016-06-11 MED ORDER — ONDANSETRON HCL 4 MG PO TABS: 4.0000 mg | ORAL_TABLET | Freq: Four times a day (QID) | ORAL | Status: DC | PRN

## 2016-06-11 MED ORDER — POTASSIUM CHLORIDE CRYS ER 20 MEQ PO TBCR
40.0000 meq | EXTENDED_RELEASE_TABLET | Freq: Once | ORAL | Status: AC
  Administered 2016-06-11: 40 meq via ORAL
  Filled 2016-06-11: qty 2

## 2016-06-11 MED ORDER — INSULIN ASPART 100 UNIT/ML ~~LOC~~ SOLN
0.0000 [IU] | Freq: Three times a day (TID) | SUBCUTANEOUS | Status: DC
  Administered 2016-06-11 (×2): 2 [IU] via SUBCUTANEOUS
  Administered 2016-06-11: 5 [IU] via SUBCUTANEOUS
  Administered 2016-06-12 – 2016-06-13 (×2): 2 [IU] via SUBCUTANEOUS

## 2016-06-11 MED ORDER — SODIUM CHLORIDE 0.9 % IV SOLN
INTRAVENOUS | Status: DC
  Administered 2016-06-11 – 2016-06-12 (×2): via INTRAVENOUS

## 2016-06-11 MED ORDER — HYDROCODONE-ACETAMINOPHEN 7.5-325 MG PO TABS
1.0000 | ORAL_TABLET | ORAL | Status: DC | PRN
  Administered 2016-06-11 – 2016-06-14 (×7): 1 via ORAL
  Filled 2016-06-11 (×7): qty 1

## 2016-06-11 MED ORDER — METHOCARBAMOL 500 MG PO TABS
500.0000 mg | ORAL_TABLET | Freq: Three times a day (TID) | ORAL | Status: DC | PRN
  Administered 2016-06-11 – 2016-06-14 (×3): 500 mg via ORAL
  Filled 2016-06-11 (×3): qty 1

## 2016-06-11 MED ORDER — CARVEDILOL 6.25 MG PO TABS
6.2500 mg | ORAL_TABLET | Freq: Every day | ORAL | Status: DC
  Administered 2016-06-11 – 2016-06-15 (×5): 6.25 mg via ORAL
  Filled 2016-06-11 (×5): qty 1

## 2016-06-11 MED ORDER — SENNOSIDES-DOCUSATE SODIUM 8.6-50 MG PO TABS
2.0000 | ORAL_TABLET | Freq: Every day | ORAL | Status: DC
  Administered 2016-06-11 – 2016-06-14 (×4): 2 via ORAL
  Filled 2016-06-11 (×4): qty 2

## 2016-06-11 MED ORDER — FLUTICASONE PROPIONATE 50 MCG/ACT NA SUSP
1.0000 | Freq: Every day | NASAL | Status: DC
  Administered 2016-06-11 – 2016-06-12 (×2): 1 via NASAL
  Filled 2016-06-11: qty 16

## 2016-06-11 MED ORDER — ENOXAPARIN SODIUM 40 MG/0.4ML ~~LOC~~ SOLN
40.0000 mg | Freq: Every day | SUBCUTANEOUS | Status: DC
  Administered 2016-06-11 – 2016-06-15 (×5): 40 mg via SUBCUTANEOUS
  Filled 2016-06-11 (×6): qty 0.4

## 2016-06-11 MED ORDER — BACLOFEN 10 MG PO TABS
10.0000 mg | ORAL_TABLET | Freq: Two times a day (BID) | ORAL | Status: DC
  Administered 2016-06-11 – 2016-06-15 (×9): 10 mg via ORAL
  Filled 2016-06-11 (×9): qty 1

## 2016-06-11 MED ORDER — CALCIUM CARBONATE ANTACID 500 MG PO CHEW
1.0000 | CHEWABLE_TABLET | Freq: Three times a day (TID) | ORAL | Status: DC | PRN
  Administered 2016-06-11: 200 mg via ORAL
  Filled 2016-06-11: qty 1

## 2016-06-11 MED ORDER — BACLOFEN 10 MG PO TABS: 10.0000 mg | ORAL_TABLET | Freq: Two times a day (BID) | ORAL | Status: DC

## 2016-06-11 MED ORDER — ACETAMINOPHEN 325 MG PO TABS
650.0000 mg | ORAL_TABLET | Freq: Four times a day (QID) | ORAL | Status: DC | PRN
  Administered 2016-06-11 – 2016-06-13 (×3): 650 mg via ORAL
  Filled 2016-06-11 (×3): qty 2

## 2016-06-11 MED ORDER — BISACODYL 10 MG RE SUPP
10.0000 mg | RECTAL | Status: DC
  Administered 2016-06-11 – 2016-06-15 (×3): 10 mg via RECTAL
  Filled 2016-06-11 (×3): qty 1

## 2016-06-11 MED ORDER — METOCLOPRAMIDE HCL 5 MG PO TABS
5.0000 mg | ORAL_TABLET | Freq: Three times a day (TID) | ORAL | Status: DC
  Administered 2016-06-11 – 2016-06-15 (×13): 5 mg via ORAL
  Filled 2016-06-11 (×13): qty 1

## 2016-06-11 MED ORDER — LORATADINE 10 MG PO TABS
10.0000 mg | ORAL_TABLET | Freq: Every day | ORAL | Status: DC
  Administered 2016-06-11 – 2016-06-15 (×5): 10 mg via ORAL
  Filled 2016-06-11 (×5): qty 1

## 2016-06-11 MED ORDER — ACETAMINOPHEN 650 MG RE SUPP: 650.0000 mg | Freq: Four times a day (QID) | RECTAL | Status: DC | PRN

## 2016-06-11 MED ORDER — LINACLOTIDE 145 MCG PO CAPS
290.0000 ug | ORAL_CAPSULE | Freq: Every day | ORAL | Status: DC
  Administered 2016-06-11 – 2016-06-14 (×4): 290 ug via ORAL
  Filled 2016-06-11 (×3): qty 2
  Filled 2016-06-11 (×2): qty 1

## 2016-06-11 MED ORDER — ONDANSETRON HCL 4 MG/2ML IJ SOLN
4.0000 mg | Freq: Four times a day (QID) | INTRAMUSCULAR | Status: DC | PRN
  Administered 2016-06-11: 4 mg via INTRAVENOUS
  Filled 2016-06-11: qty 2

## 2016-06-11 MED ORDER — GLUCERNA SHAKE PO LIQD
237.0000 mL | Freq: Three times a day (TID) | ORAL | Status: DC
  Administered 2016-06-11 – 2016-06-15 (×11): 237 mL via ORAL

## 2016-06-11 MED ORDER — TRAZODONE HCL 50 MG PO TABS
50.0000 mg | ORAL_TABLET | Freq: Every day | ORAL | Status: DC
  Administered 2016-06-11 – 2016-06-14 (×4): 50 mg via ORAL
  Filled 2016-06-11 (×4): qty 1

## 2016-06-11 MED ORDER — TRAZODONE HCL 50 MG PO TABS: 50.0000 mg | ORAL_TABLET | Freq: Every day | ORAL | Status: DC
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
## 2016-06-11 NOTE — Progress Notes (Signed)
This RN attempted to apply patient armbands and patient refuse to wear purple DNR bracelet, states she is full code. MD made aware of need for change in code status from DNR to full.

## 2016-06-11 NOTE — Progress Notes (Signed)
Pharmacy notified that scheduled vancomycin not on floor available for administration to patient. Will give to patient upon med arrival to floor.

## 2016-06-11 NOTE — Consult Note (Addendum)
[place] Nurse wound consult note Reason for Consult: spinal wound, coccyx wound Wound type: coccyx is stage III, spinal area is healed scar tissue Pressure Ulcer POA: Yes Measurement:coccyx 1cm x 0.75cm x 0.25, macerated from urine, no drainage or odor, wound bed difficult to see due to size. Has scar tissue on both buttocks.  Applied butterfly shape foam to cover buttock scar tissue. Wound bed: See description above Drainage (amount, consistency, odor) See description above Periwound:See description above Dressing procedure/placement/frequency: I have provided nurses with orders for To coccyx wound peel protective foam off daily to inspect wound, cleanse with NS, pat dry, apply barrier cream for protection, change dressing prn soiling.Pt states that a wound doctor visits facility weekly to assess and treat her coccyx wound. Keep patient turned on side, elevate heels and ankles. Discussed these measures with patient, very knowledgeable and agrees.  The large area on spine which is 20cm by 8cm is healed scar tissue. Pt aware of need to stay off back.  Pt is on air bed and will order one for when pt is transferred to floor.   There is an area of reddened skin that appears like cellulits which MD was shown this am by bedside nurse.  It measures 20cm x 9cm on her right flank. We will not follow, but will remain available to this patient, to nursing, and the medical and/or surgical teams. Please re-consult if we need to assist further.     Fara Olden, RN-C, WTA-C W[facility]
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
## 2016-06-11 NOTE — ED Notes (Signed)
Pt made aware of bed assignment 

## 2016-06-11 NOTE — ED Notes (Signed)
Nurse drawing labs. 

## 2016-06-11 NOTE — ED Notes (Signed)
Pt changed into hospital gown and linens changed when pt moved to inpt bed with air mattress.  Pt educated to controls.  All 4 rails up for safety.

## 2016-06-11 NOTE — Progress Notes (Signed)
Going through shift report with oncoming day shift nurse and showing her wound noticed that they were petechiae type areas also coming across the bottom of feet and lower leg area. MD at bedside this morning made aware.

## 2016-06-11 NOTE — ED Notes (Signed)
Pt will be hold in the ED, no SD beds available at this time. Portable equipment notified of the need for inpt bed with air flow mattress

## 2016-06-11 NOTE — Progress Notes (Signed)
PHARMACY - PHYSICIAN COMMUNICATION CRITICAL VALUE ALERT - BLOOD CULTURE IDENTIFICATION (BCID)  Results for orders placed or performed during the hospital encounter of 06/10/16  Blood Culture ID Panel (Reflexed) (Collected: 06/10/2016  7:02 PM)  Result Value Ref Range   Enterococcus species NOT DETECTED NOT DETECTED   Listeria monocytogenes NOT DETECTED NOT DETECTED   Staphylococcus species NOT DETECTED NOT DETECTED   Staphylococcus aureus NOT DETECTED NOT DETECTED   Streptococcus species DETECTED (A) NOT DETECTED   Streptococcus agalactiae NOT DETECTED NOT DETECTED   Streptococcus pneumoniae NOT DETECTED NOT DETECTED   Streptococcus pyogenes DETECTED (A) NOT DETECTED   Acinetobacter baumannii NOT DETECTED NOT DETECTED   Enterobacteriaceae species NOT DETECTED NOT DETECTED   Enterobacter cloacae complex NOT DETECTED NOT DETECTED   Escherichia coli NOT DETECTED NOT DETECTED   Klebsiella oxytoca NOT DETECTED NOT DETECTED   Klebsiella pneumoniae NOT DETECTED NOT DETECTED   Proteus species NOT DETECTED NOT DETECTED   Serratia marcescens NOT DETECTED NOT DETECTED   Haemophilus influenzae NOT DETECTED NOT DETECTED   Neisseria meningitidis NOT DETECTED NOT DETECTED   Pseudomonas aeruginosa NOT DETECTED NOT DETECTED   Candida albicans NOT DETECTED NOT DETECTED   Candida glabrata NOT DETECTED NOT DETECTED   Candida krusei NOT DETECTED NOT DETECTED   Candida parapsilosis NOT DETECTED NOT DETECTED   Candida tropicalis NOT DETECTED NOT DETECTED    Name of physician (or Provider) Contacted: Curly Rim  Changes to prescribed antibiotics required: Discontinue Vancomycin.    Belia Heman, PharmD PGY1 Pharmacy Resident [phone] (Pager) 06/11/2016 10:33 AM

## 2016-06-11 NOTE — Progress Notes (Signed)
[name]  Critical value received: Lactic acid 2.8  Date of notification:  06/11/16   Time of notification:  0200  Critical value read back: yes  Nurse who received alert:  Levada Dy   MD notified (1st page): Baltazar Najjar  Time of first page:  0230

## 2016-06-11 NOTE — Progress Notes (Signed)
Pt arrived to progressive stepdown unit with multiple pressure injuries and a large incision from previous surgery on back. Incision on back is approx. 21cm long and about 15cm in its widest spot. This large incision has had to be debrided weekly at SNF by wound team (usually on Thursday). Around pts large incision are raised area that is red and swollen with areas that appear to look like petchiae. Pt states she has not itched around area. Areas that are red and swollen are on left flank coming out from back injury, a isolated spot approx 3.5in-1.5in on the left flank, and another area going down from left buttocks into hip. Pt does not c/o pain in these areas. Area was assessed by Probation officer and Charge nurse Brien Mates who we both agreed would be best assessed and properly stage by a wound consult.

## 2016-06-12 LAB — COMPREHENSIVE METABOLIC PANEL
ALBUMIN: 2.4 g/dL — AB (ref 3.5–5.0)
ALK PHOS: 61 U/L (ref 38–126)
ALT: 47 U/L (ref 14–54)
ANION GAP: 5 (ref 5–15)
AST: 30 U/L (ref 15–41)
BILIRUBIN TOTAL: 0.7 mg/dL (ref 0.3–1.2)
BUN: 7 mg/dL (ref 6–20)
CALCIUM: 8.2 mg/dL — AB (ref 8.9–10.3)
CO2: 22 mmol/L (ref 22–32)
Chloride: 112 mmol/L — ABNORMAL HIGH (ref 101–111)
Creatinine, Ser: 0.55 mg/dL (ref 0.44–1.00)
GFR calc Af Amer: >60 mL/min (ref >60)
GLUCOSE: 126 mg/dL — AB (ref 65–99)
POTASSIUM: 3.6 mmol/L (ref 3.5–5.1)
Sodium: 139 mmol/L (ref 135–145)
TOTAL PROTEIN: 5.7 g/dL — AB (ref 6.5–8.1)

## 2016-06-12 LAB — CBC
HEMATOCRIT: 33.5 % — AB (ref 36.0–46.0)
HEMOGLOBIN: 10.9 g/dL — AB (ref 12.0–15.0)
MCH: 27.7 pg (ref 26.0–34.0)
MCHC: 32.5 g/dL (ref 30.0–36.0)
MCV: 85 fL (ref 78.0–100.0)
Platelets: 105 10*3/uL — ABNORMAL LOW (ref 150–400)
RBC: 3.94 MIL/uL (ref 3.87–5.11)
RDW: 14.3 % (ref 11.5–15.5)
WBC: 7.4 10*3/uL (ref 4.0–10.5)

## 2016-06-12 LAB — GLUCOSE, CAPILLARY
GLUCOSE-CAPILLARY: 104 mg/dL — AB (ref 65–99)
GLUCOSE-CAPILLARY: 130 mg/dL — AB (ref 65–99)
Glucose-Capillary: 94 mg/dL (ref 65–99)
Glucose-Capillary: 163 mg/dL — ABNORMAL HIGH (ref 65–99)

## 2016-06-12 LAB — HEMOGLOBIN A1C
Hgb A1c MFr Bld: 6.1 % — ABNORMAL HIGH (ref 4.8–5.6)
Mean Plasma Glucose: 128 mg/dL

## 2016-06-12 MED ORDER — LEVALBUTEROL HCL 1.25 MG/0.5ML IN NEBU
1.2500 mg | INHALATION_SOLUTION | Freq: Four times a day (QID) | RESPIRATORY_TRACT | Status: DC | PRN
  Filled 2016-06-12: qty 0.5

## 2016-06-12 MED ORDER — HYDROXYZINE HCL 25 MG PO TABS
25.0000 mg | ORAL_TABLET | Freq: Once | ORAL | Status: AC
  Administered 2016-06-12: 25 mg via ORAL
  Filled 2016-06-12: qty 1

## 2016-06-12 MED ORDER — FUROSEMIDE 10 MG/ML IJ SOLN
40.0000 mg | Freq: Once | INTRAMUSCULAR | Status: AC
  Administered 2016-06-12: 40 mg via INTRAVENOUS
  Filled 2016-06-12: qty 4

## 2016-06-12 NOTE — Consult Note (Signed)
WOC re-consulted to meet with family to discuss wounds that are POA.  See my partners note from 06/11/16.  Wounds are just as described in her note.  Met with patient and her sister for extended visit to explain color changes in the new re-epithelialized skin in person of color and the lack of melanin.  We (sister) and this Opal nurse viewed the gluteal cleft wound (Stage 3 PI), unchanged from my WTA partners description.  Explained difficultly with healing this area, however it is noted this is very small, clean wound.  Continue foam dressing as written, explained rationale for foam dressing and correct application technique.  Family and patient seemed less concerned about wounds.  Back wound is totally healed, however there area areas that are very fragile that patient and family need to monitor for changes at least weekly. Stressed importance of turning side to side.  Patient has LALM in place, would benefit from this at the time of DC. Will place note on chart for CM to recommend for use in SNF.  We discussed current sitting surface in her WC, she has a ROHO cushion which is adequate to meet her needs for pressure redistribution when sitting. Encouraged patient to increase her PO intake if possible and include extra protein. We discussed the food in the SNF she resides and I have suggested trying some protein bars if they can be brought in for her to have as a snack.  Patient and sister thanked me for re-visiting to discuss current wound status.   Re consult if needed, will not follow at this time. Thanks  Amaal Dimartino [employer], RN,CWOCN, CNS [phone])

## 2016-06-12 NOTE — Consult Note (Addendum)
  WOC consult requested for sacrum wound.  This was already performed on 11/16; please refer to previous consult note for assessment, plan of care and measurements. Topical treatment orders have been provided for bedside nurses. Please re-consult if further assistance is needed.  Thank-you,  Julien Girt MSN, [place], [place], [place], [place]

## 2016-06-12 NOTE — Progress Notes (Signed)
Paged Dr. Hartford Poli to notify him that the patient has no order for cardiac monitoring. He called back and said ok to discontinue telemetry. Endorsed to incoming nurse.

## 2016-06-12 NOTE — Progress Notes (Signed)
Report called to [facility] RN. Patient to be transferred to 6N14.
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
## 2016-06-13 LAB — CBC
HCT: 36.5 % (ref 36.0–46.0)
HEMOGLOBIN: 11.7 g/dL — AB (ref 12.0–15.0)
MCH: 27.3 pg (ref 26.0–34.0)
MCHC: 32.1 g/dL (ref 30.0–36.0)
MCV: 85.1 fL (ref 78.0–100.0)
Platelets: 138 10*3/uL — ABNORMAL LOW (ref 150–400)
RBC: 4.29 MIL/uL (ref 3.87–5.11)
RDW: 14.4 % (ref 11.5–15.5)
WBC: 7.4 10*3/uL (ref 4.0–10.5)

## 2016-06-13 LAB — CULTURE, BLOOD (ROUTINE X 2)

## 2016-06-13 LAB — GLUCOSE, CAPILLARY
GLUCOSE-CAPILLARY: 94 mg/dL (ref 65–99)
GLUCOSE-CAPILLARY: 136 mg/dL — AB (ref 65–99)
Glucose-Capillary: 128 mg/dL — ABNORMAL HIGH (ref 65–99)
Glucose-Capillary: 115 mg/dL — ABNORMAL HIGH (ref 65–99)

## 2016-06-13 LAB — BASIC METABOLIC PANEL
ANION GAP: 8 (ref 5–15)
BUN: 5 mg/dL — ABNORMAL LOW (ref 6–20)
CALCIUM: 8.4 mg/dL — AB (ref 8.9–10.3)
CO2: 24 mmol/L (ref 22–32)
CREATININE: 0.56 mg/dL (ref 0.44–1.00)
Chloride: 107 mmol/L (ref 101–111)
GFR calc Af Amer: >60 mL/min (ref >60)
GFR calc non Af Amer: >60 mL/min (ref >60)
GLUCOSE: 79 mg/dL (ref 65–99)
Potassium: 3.4 mmol/L — ABNORMAL LOW (ref 3.5–5.1)
Sodium: 139 mmol/L (ref 135–145)

## 2016-06-13 MED ORDER — DEXTROSE 5 % IV SOLN
2.0000 g | INTRAVENOUS | Status: DC
  Administered 2016-06-13 – 2016-06-15 (×3): 2 g via INTRAVENOUS
  Filled 2016-06-13 (×4): qty 2
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
## 2016-06-14 LAB — BASIC METABOLIC PANEL
Anion gap: 7 (ref 5–15)
BUN: 6 mg/dL (ref 6–20)
CALCIUM: 8.7 mg/dL — AB (ref 8.9–10.3)
CO2: 25 mmol/L (ref 22–32)
CREATININE: 0.56 mg/dL (ref 0.44–1.00)
Chloride: 109 mmol/L (ref 101–111)
GFR calc non Af Amer: >60 mL/min (ref >60)
Glucose, Bld: 80 mg/dL (ref 65–99)
Potassium: 3.3 mmol/L — ABNORMAL LOW (ref 3.5–5.1)
SODIUM: 141 mmol/L (ref 135–145)

## 2016-06-14 LAB — CBC
HCT: 35.2 % — ABNORMAL LOW (ref 36.0–46.0)
Hemoglobin: 11.5 g/dL — ABNORMAL LOW (ref 12.0–15.0)
MCH: 27.5 pg (ref 26.0–34.0)
MCHC: 32.7 g/dL (ref 30.0–36.0)
MCV: 84.2 fL (ref 78.0–100.0)
PLATELETS: 161 10*3/uL (ref 150–400)
RBC: 4.18 MIL/uL (ref 3.87–5.11)
RDW: 14 % (ref 11.5–15.5)
WBC: 5.1 10*3/uL (ref 4.0–10.5)

## 2016-06-14 LAB — GLUCOSE, CAPILLARY
GLUCOSE-CAPILLARY: 82 mg/dL (ref 65–99)
GLUCOSE-CAPILLARY: 90 mg/dL (ref 65–99)
Glucose-Capillary: 111 mg/dL — ABNORMAL HIGH (ref 65–99)

## 2016-06-14 MED ORDER — POTASSIUM CHLORIDE CRYS ER 20 MEQ PO TBCR
40.0000 meq | EXTENDED_RELEASE_TABLET | Freq: Four times a day (QID) | ORAL | Status: AC
Start: 2016-06-14 — End: 2016-06-14
  Administered 2016-06-14 (×2): 40 meq via ORAL
  Filled 2016-06-14 (×2): qty 2

## 2016-06-14 MED ORDER — MAGNESIUM SULFATE 2 GM/50ML IV SOLN
2.0000 g | Freq: Once | INTRAVENOUS | Status: AC
  Administered 2016-06-14: 2 g via INTRAVENOUS
  Filled 2016-06-14: qty 50
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
## 2016-06-15 LAB — GLUCOSE, CAPILLARY
GLUCOSE-CAPILLARY: 88 mg/dL (ref 65–99)
GLUCOSE-CAPILLARY: 88 mg/dL (ref 65–99)
Glucose-Capillary: 105 mg/dL — ABNORMAL HIGH (ref 65–99)
Glucose-Capillary: 99 mg/dL (ref 65–99)

## 2016-06-15 LAB — CULTURE, BLOOD (ROUTINE X 2): Culture: NO GROWTH

## 2016-06-15 MED ORDER — AMOXICILLIN 500 MG PO CAPS: 500.0000 mg | ORAL_CAPSULE | Freq: Three times a day (TID) | ORAL | 0 refills | Status: AC

## 2016-06-15 MED ORDER — HYDROCODONE-ACETAMINOPHEN 7.5-325 MG PO TABS: 1.0000 | ORAL_TABLET | Freq: Four times a day (QID) | ORAL | 0 refills | Status: DC | PRN

## 2016-06-15 NOTE — Clinical Social Work Note (Signed)
Clinical Social Work Assessment  Patient Details  Name: [name] MRN: [id number] Date of Birth: Dec 21, 1950  Date of referral:  06/15/16               Reason for consult:  Discharge Planning (From [facility])                Permission sought to share information with:  Case Manager, Facility Sport and exercise psychologist, Family Supports Permission granted to share information::  Yes, Verbal Permission Granted  Name::        Agency::  [facility]  Relationship::  Sister  Sport and exercise psychologist Information:     Housing/Transportation Living arrangements for the past 2 months:  [place] of Information:  Patient, Scientist, water quality, Tourist information centre manager, Facility, Other (Comment Required) (sister) Patient Interpreter Needed:  None Criminal Activity/Legal Involvement Pertinent to Current Situation/Hospitalization:  No - Comment as needed Significant Relationships:  Other Family Members, Community Support Lives with:  Facility Resident Do you feel safe going back to the place where you live?  Yes Need for family participation in patient care:  Yes (Comment)  Care giving concerns:  No care giving concerns noted at this time. Patient is a LTC patient at nursing facility: [facility]. Her plan is to return. There are new recommendations made by [place] RN in which LCSW will update on FL2 as well as notify the building.     Social Worker assessment / plan:  Plan: Return to NH: [facility] when medically stable. Will update FL2. Will assist with return to NH/disposition.  Following as needed. Facility aware of admission, agreeable for return.  Employment status:  Retired, Disabled (Comment on whether or not currently receiving Disability) Insurance information:  Programmer, applications, Medicaid In [place] PT Recommendations:  Not assessed at this time Information / Referral to community resources:  [place]  Patient/Family's Response to care:  Agreeable to plan  Patient/Family's  Understanding of and Emotional Response to Diagnosis, Current Treatment, and Prognosis:  Family and patient understanding of current prognosis and needs at this time.  They voice no concerns and are appreciative of care.  Emotional Assessment Appearance:  Appears stated age Attitude/Demeanor/Rapport:  Other (cooperative, engaged) Affect (typically observed):  Accepting, Adaptable Orientation:  Oriented to Self, Oriented to Place, Oriented to  Time, Oriented to Situation Alcohol / Substance use:  Not Applicable Psych involvement (Current and /or in the community):  No (Comment)  Discharge Needs  Concerns to be addressed:  No discharge needs identified Readmission within the last 30 days:  No Current discharge risk:  None Barriers to Discharge:  No Barriers Identified, Continued Medical Work up   Lilly Cove, LCSW 06/15/2016, 11:17 AM

## 2016-06-15 NOTE — NC FL2 (Signed)
[place] LEVEL OF CARE SCREENING TOOL     IDENTIFICATION  Patient Name: [name] Birthdate: 1951/06/01 Sex: female Admission Date (Current Location): 06/10/2016  [facility] and [place] Number:  Herbalist and Address:  [facility], [place] [address], [place], [place] [address]      Provider Number: [id number]  Attending Physician Name and Address:  Verlee Monte, MD  Relative Name and Phone Number:       Current Level of Care: Hospital Recommended Level of Care: [place] Prior Approval Number:    Date Approved/Denied:   PASRR Number:    Discharge Plan: SNF    Current Diagnoses: Patient Active Problem List   Diagnosis Date Noted  . Sepsis ([place]) 06/10/2016  . Decubitus ulcer of coccyx 02/04/2016  . Hyperglycemia 12/03/2015  . H/O bilateral mastectomy 07/25/2015  . Benign hypertensive heart disease without heart failure 07/15/2015  . Hiatal hernia 07/06/2015  . 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 History of breast cancer 08/21/2014  . Adynamic ileus ([place]) 07/22/2014  . Elevated liver enzymes 07/22/2014  . CHF (congestive heart failure) ([place]) 07/05/2014  . Gastroparesis 07/05/2014  . Microcytic anemia 01/05/2014  . Insomnia 07/14/2013  . Edema 05/01/2013  . Rheumatoid arthritis ([place]) 02/23/2013  . GERD (gastroesophageal reflux disease) 02/23/2013  . Neurogenic bladder 02/23/2013  . Paraplegia ([place]) 02/23/2013  . Depression 02/23/2013  . Allergic rhinitis 02/23/2013  . Constipation 02/23/2013  . 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[place]) 09/24/2011  . Breast cancer, left breast ([place]) 09/08/2011    Orientation RESPIRATION BLADDER Height & Weight     Self, Time, Situation, Place  Normal Continent Weight: 170 lb (77.1 kg) Height:  5\' 5"  (165.1 cm)  BEHAVIORAL SYMPTOMS/MOOD NEUROLOGICAL BOWEL NUTRITION STATUS      Incontinent Diet (Carb Modified)  AMBULATORY STATUS  COMMUNICATION OF NEEDS Skin   Total Care (nonambulatroy) Verbally PU Stage and Appropriate Care (coccyx is stage III, spinal area is healed scar tissue)     PU Stage 3 Dressing: Daily (To coccyx wound peel protective foam off daily to inspect wound, cleanse with NS, pat dry, apply barrier cream for protection, change dressing prn soiling.)                 Personal Care Assistance Level of Assistance  Bathing, Feeding, Dressing Bathing Assistance: Maximum assistance Feeding assistance: Limited assistance Dressing Assistance: Maximum assistance     Functional Limitations Info  Sight, Hearing, Speech Sight Info: Adequate Hearing Info: Adequate Speech Info: Adequate    SPECIAL CARE FACTORS FREQUENCY   (Wound care at facility)    Daily (continued prior to admission)                Contractures Contractures Info: Not present    Additional Factors Info  Code Status, Allergies Code Status Info: Full Code Allergies Info: NKA           Current Medications (06/15/2016):  This is the current hospital active medication list Current Facility-Administered Medications  Medication Dose Route Frequency Provider Last Rate Last Dose  . acetaminophen (TYLENOL) tablet 650 mg  650 mg Oral Q6H PRN Karmen Bongo, MD   650 mg at 06/13/16 2237   Or  . acetaminophen (TYLENOL) suppository 650 mg  650 mg Rectal Q6H PRN Karmen Bongo, MD      . baclofen (LIORESAL) tablet 10 mg  10 mg Oral BID Karmen Bongo, MD   10 mg  at 06/15/16 0929  . bisacodyl (DULCOLAX) suppository 10 mg  10 mg Rectal QODAY Karmen Bongo, MD   10 mg at 06/15/16 0927  . calcium carbonate (TUMS - dosed in mg elemental calcium) chewable tablet 200-400 mg of elemental calcium  1-2 tablet Oral TID PRN Bonnielee Haff, MD   200 mg of elemental calcium at 06/11/16 0900  . carvedilol (COREG) tablet 6.25 mg  6.25 mg Oral Daily Karmen Bongo, MD   6.25 mg at 06/15/16 0930  . cefTRIAXone (ROCEPHIN) 2 g in dextrose 5 % 50 mL IVPB   2 g Intravenous Q24H Verlee Monte, MD   2 g at 06/14/16 1252  . diphenhydrAMINE (BENADRYL) capsule 25 mg  25 mg Oral Q8H PRN Karmen Bongo, MD      . enoxaparin (LOVENOX) injection 40 mg  40 mg Subcutaneous Daily Karmen Bongo, MD   40 mg at 06/15/16 0932  . escitalopram (LEXAPRO) tablet 15 mg  15 mg Oral Daily Karmen Bongo, MD   15 mg at 06/15/16 0927  . feeding supplement (GLUCERNA SHAKE) (GLUCERNA SHAKE) liquid 237 mL  237 mL Oral TID BM Bonnielee Haff, MD   237 mL at 06/15/16 0932  . fluticasone (FLONASE) 50 MCG/ACT nasal spray 1 spray  1 spray Each Nare Daily Karmen Bongo, MD   1 spray at 06/12/16 0910  . HYDROcodone-acetaminophen (NORCO) 7.5-325 MG per tablet 1 tablet  1 tablet Oral Q4H PRN Karmen Bongo, MD   1 tablet at 06/14/16 2247  . hydroxychloroquine (PLAQUENIL) tablet 200 mg  200 mg Oral Daily Karmen Bongo, MD   200 mg at 06/15/16 [id number]  . insulin aspart (novoLOG) injection 0-15 Units  0-15 Units Subcutaneous TID WC Karmen Bongo, MD   2 Units at 06/13/16 1819  . lactated ringers infusion   Intravenous Continuous Karmen Bongo, MD 75 mL/hr at 06/12/16 2144 1 mL at 06/12/16 2144  . levalbuterol (XOPENEX) nebulizer solution 1.25 mg  1.25 mg Nebulization Q6H PRN Reyne Dumas, MD      . linaclotide Rolan Lipa) capsule 290 mcg  290 mcg Oral Daily Karmen Bongo, MD   290 mcg at 06/14/16 0931  . loratadine (CLARITIN) tablet 10 mg  10 mg Oral Daily Karmen Bongo, MD   10 mg at 06/15/16 [id number]  . methocarbamol (ROBAXIN) tablet 500 mg  500 mg Oral Q8H PRN Karmen Bongo, MD   500 mg at 06/14/16 2247  . metoCLOPramide (REGLAN) tablet 5 mg  5 mg Oral TID Karmen Bongo, MD   5 mg at 06/15/16 0930  . multivitamin with minerals tablet 1 tablet  1 tablet Oral Daily Karmen Bongo, MD   1 tablet at 06/15/16 1000  . ondansetron (ZOFRAN) tablet 4 mg  4 mg Oral Q6H PRN Karmen Bongo, MD       Or  . ondansetron [facility]) injection 4 mg  4 mg Intravenous Q6H PRN Karmen Bongo, MD   4 mg at  06/11/16 1915  . pantoprazole (PROTONIX) EC tablet 80 mg  80 mg Oral Q1200 Karmen Bongo, MD   80 mg at 06/14/16 1252  . senna-docusate (Senokot-S) tablet 2 tablet  2 tablet Oral QHS Karmen Bongo, MD   2 tablet at 06/14/16 2128  . traZODone (DESYREL) tablet 50 mg  50 mg Oral QHS Karmen Bongo, MD   50 mg at 06/14/16 2128     Discharge Medications: Please see discharge summary for a list of discharge medications.  Relevant Imaging Results:  Relevant Lab Results:   Additional Information We  discussed current sitting surface in her WC, she has a ROHO cushion which is adequate to meet her needs for pressure redistribution when sitting. Encouraged patient to increase her PO intake if possible and include extra protein. We discussed the food in the SNF she resides and I have suggested trying some protein bars if they can be brought in for her to have as a snack.   Patient would benefit from low air loss mattress for her bed in the SNF she resides. She is high risk for recurrence of skin break down and she is incontinent of urine and feces, this would lessen her risk.  Lilly Cove, LCSW

## 2016-06-15 NOTE — Care Management Important Message (Signed)
Important Message  Patient Details  Name: CHRISHAWN BELLE MRN: [id number] Date of Birth: 08-21-1950   Medicare Important Message Given:  Yes    Nancey Kreitz [address] 06/15/2016, 11:47 AM

## 2016-06-15 NOTE — Progress Notes (Signed)
Report called to [place] at [place]

## 2016-06-15 NOTE — Discharge Summary (Signed)
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 06/11/16 BY D.VANHOOK    Streptococcus agalactiae NOT DETECTED NOT DETECTED Final  Streptococcus pneumoniae NOT DETECTED NOT DETECTED Final   Streptococcus pyogenes DETECTED (A) NOT DETECTED Final    Comment: CRITICAL RESULT CALLED TO, READ BACK BY AND VERIFIED WITH: M. MACCIA PHARMD, AT 1022 06/11/16 BY D.VANHOOK    Acinetobacter baumannii NOT DETECTED NOT DETECTED Final   Enterobacteriaceae species NOT DETECTED NOT DETECTED Final   Enterobacter cloacae complex NOT DETECTED NOT DETECTED Final   Escherichia coli NOT DETECTED NOT DETECTED Final   Klebsiella oxytoca NOT DETECTED NOT DETECTED Final   Klebsiella pneumoniae NOT DETECTED NOT DETECTED Final   Proteus species NOT DETECTED NOT DETECTED Final   Serratia marcescens NOT DETECTED NOT DETECTED Final   Haemophilus influenzae NOT DETECTED NOT DETECTED Final   Neisseria meningitidis NOT DETECTED  NOT DETECTED Final   Pseudomonas aeruginosa NOT DETECTED NOT DETECTED Final   Candida albicans NOT DETECTED NOT DETECTED Final   Candida glabrata NOT DETECTED NOT DETECTED Final   Candida krusei NOT DETECTED NOT DETECTED Final   Candida parapsilosis NOT DETECTED NOT DETECTED Final   Candida tropicalis NOT DETECTED NOT DETECTED Final  Culture, blood (Routine X 2) w Reflex to ID Panel     Status: None   Collection Time: 06/10/16  7:40 PM  Result Value Ref Range Status   Specimen Description BLOOD RIGHT ANTECUBITAL  Final   Special Requests BOTTLES DRAWN AEROBIC AND ANAEROBIC 5CC  Final   Culture NO GROWTH 5 DAYS  Final   Report Status 06/15/2016 FINAL  Final  Urine culture     Status: Abnormal (Preliminary result)   Collection Time: 06/10/16  8:36 PM  Result Value Ref Range Status   Specimen Description URINE, CATHETERIZED  Final   Special Requests NONE  Final   Culture (A)  Final    80,000 COLONIES/mL ESCHERICHIA COLI CULTURE REINCUBATED FOR BETTER GROWTH    Report Status PENDING  Incomplete   Organism ID, Bacteria ESCHERICHIA COLI (A)  Final      Susceptibility   Escherichia coli - MIC*    AMPICILLIN <=2 SENSITIVE Sensitive     CEFAZOLIN <=4 SENSITIVE Sensitive     CEFTRIAXONE <=1 SENSITIVE Sensitive     CIPROFLOXACIN >=4 RESISTANT Resistant     GENTAMICIN <=1 SENSITIVE Sensitive     IMIPENEM <=0.25 SENSITIVE Sensitive     NITROFURANTOIN 128 RESISTANT Resistant     TRIMETH/SULFA >=320 RESISTANT Resistant     AMPICILLIN/SULBACTAM <=2 SENSITIVE Sensitive     PIP/TAZO <=4 SENSITIVE Sensitive     Extended ESBL NEGATIVE Sensitive     * 80,000 COLONIES/mL ESCHERICHIA COLI  MRSA PCR Screening     Status: None   Collection Time: 06/11/16  2:08 AM  Result Value Ref Range Status   MRSA by PCR NEGATIVE NEGATIVE Final    Comment:        The GeneXpert MRSA Assay (FDA approved for NASAL specimens only), is one component of a comprehensive MRSA colonization surveillance program. It  is not intended to diagnose MRSA infection nor to guide or monitor treatment for MRSA infections.   Culture, blood (Routine X 2) w Reflex to ID Panel     Status: None (Preliminary result)   Collection Time: 06/13/16 12:54 PM  Result Value Ref Range Status   Specimen Description BLOOD RIGHT HAND  Final   Special Requests   Final    BOTTLES DRAWN AEROBIC AND ANAEROBIC AER [place] ANA 3CC   Culture NO GROWTH 2 DAYS  Final   Report Status PENDING  Incomplete  Culture, blood (Routine  X 2) w Reflex to ID Panel     Status: None (Preliminary result)   Collection Time: 06/13/16  1:00 PM  Result Value Ref Range Status   Specimen Description BLOOD RIGHT HAND  Final   Special Requests IN PEDIATRIC BOTTLE 3CC  Final   Culture NO GROWTH 2 DAYS  Final   Report Status PENDING  Incomplete     Time coordinating discharge: Over 30 minutes  SIGNED:   Birdie Hopes, MD  [facility] 06/15/2016, 12:42 PM Pager   If 7PM-7AM, please contact night-coverage [email or web address] Password [email or web address]

## 2016-06-15 NOTE — Progress Notes (Addendum)
Patient is medically stable for discharge to SNF today. She will return to [facility] [employer] has sent over all information to facility and patient will transport by EMS. Sister to be updated prior to EMS.  Pamala Hurry was called as no family at the bedside.  She is aware and agreeable to discharge. She will come to the hospital and assist patient back to SNF RN to call report:   [phone] No other needs at this time.  DC to SNF.  Lane Hacker, MSW Clinical Social Work: [employer]maker Coverage for :  [phone]

## 2016-06-16 ENCOUNTER — Non-Acute Institutional Stay (SKILLED_NURSING_FACILITY): Payer: Medicare Other | Admitting: Internal Medicine

## 2016-06-16 ENCOUNTER — Other Ambulatory Visit: Payer: Self-pay | Admitting: *Deleted

## 2016-06-16 ENCOUNTER — Encounter: Payer: Self-pay | Admitting: Internal Medicine

## 2016-06-16 DIAGNOSIS — L89153 Pressure ulcer of sacral region, stage 3: Secondary | ICD-10-CM | POA: Diagnosis not present

## 2016-06-16 DIAGNOSIS — I5032 Chronic diastolic (congestive) heart failure: Secondary | ICD-10-CM | POA: Diagnosis not present

## 2016-06-16 DIAGNOSIS — K219 Gastro-esophageal reflux disease without esophagitis: Secondary | ICD-10-CM

## 2016-06-16 DIAGNOSIS — R739 Hyperglycemia, unspecified: Secondary | ICD-10-CM | POA: Diagnosis not present

## 2016-06-16 DIAGNOSIS — I10 Essential (primary) hypertension: Secondary | ICD-10-CM

## 2016-06-16 DIAGNOSIS — M069 Rheumatoid arthritis, unspecified: Secondary | ICD-10-CM | POA: Diagnosis not present

## 2016-06-16 DIAGNOSIS — K5901 Slow transit constipation: Secondary | ICD-10-CM

## 2016-06-16 DIAGNOSIS — G8220 Paraplegia, unspecified: Secondary | ICD-10-CM

## 2016-06-16 MED ORDER — HYDROCODONE-ACETAMINOPHEN 7.5-325 MG PO TABS: 1.0000 | ORAL_TABLET | Freq: Four times a day (QID) | ORAL | 0 refills | Status: DC | PRN
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
## 2016-06-17 ENCOUNTER — Telehealth: Payer: Self-pay

## 2016-06-17 NOTE — Telephone Encounter (Signed)
Re-admission to facility. This is a patient you were seeing at [facility] . [facility] F/U is needed if patient was re-admitted to facility upon discharge. Hospital discharge from [facility] on 06/15/16.

## 2016-06-18 LAB — CULTURE, BLOOD (ROUTINE X 2)
CULTURE: NO GROWTH
Culture: NO GROWTH

## 2016-06-18 LAB — URINE CULTURE: Culture: 80000 — AB

## 2016-07-13 ENCOUNTER — Encounter: Payer: Self-pay | Admitting: Adult Health

## 2016-07-13 ENCOUNTER — Non-Acute Institutional Stay (SKILLED_NURSING_FACILITY): Payer: Medicare Other | Admitting: Adult Health

## 2016-07-13 DIAGNOSIS — G8220 Paraplegia, unspecified: Secondary | ICD-10-CM

## 2016-07-13 DIAGNOSIS — I119 Hypertensive heart disease without heart failure: Secondary | ICD-10-CM | POA: Diagnosis not present

## 2016-07-13 DIAGNOSIS — K219 Gastro-esophageal reflux disease without esophagitis: Secondary | ICD-10-CM

## 2016-07-13 DIAGNOSIS — D509 Iron deficiency anemia, unspecified: Secondary | ICD-10-CM | POA: Diagnosis not present

## 2016-07-13 DIAGNOSIS — M069 Rheumatoid arthritis, unspecified: Secondary | ICD-10-CM

## 2016-07-13 DIAGNOSIS — L89153 Pressure ulcer of sacral region, stage 3: Secondary | ICD-10-CM

## 2016-07-13 DIAGNOSIS — I5032 Chronic diastolic (congestive) heart failure: Secondary | ICD-10-CM

## 2016-07-13 DIAGNOSIS — K3184 Gastroparesis: Secondary | ICD-10-CM
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
## 2016-07-28 ENCOUNTER — Encounter: Payer: Self-pay | Admitting: Adult Health

## 2016-07-28 ENCOUNTER — Non-Acute Institutional Stay (SKILLED_NURSING_FACILITY): Payer: Medicare Other | Admitting: Adult Health

## 2016-07-28 DIAGNOSIS — J189 Pneumonia, unspecified organism: Secondary | ICD-10-CM | POA: Diagnosis not present

## 2016-07-28 DIAGNOSIS — R509 Fever, unspecified: Secondary | ICD-10-CM | POA: Diagnosis not present
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
## 2016-08-31 ENCOUNTER — Encounter: Payer: Self-pay | Admitting: Adult Health

## 2016-08-31 ENCOUNTER — Non-Acute Institutional Stay (SKILLED_NURSING_FACILITY): Payer: Medicare Other | Admitting: Adult Health

## 2016-08-31 DIAGNOSIS — K3184 Gastroparesis: Secondary | ICD-10-CM

## 2016-08-31 DIAGNOSIS — I5032 Chronic diastolic (congestive) heart failure: Secondary | ICD-10-CM | POA: Diagnosis not present

## 2016-08-31 DIAGNOSIS — I119 Hypertensive heart disease without heart failure: Secondary | ICD-10-CM | POA: Diagnosis not present

## 2016-08-31 DIAGNOSIS — Z9013 Acquired absence of bilateral breasts and nipples: Secondary | ICD-10-CM

## 2016-08-31 DIAGNOSIS — K219 Gastro-esophageal reflux disease without esophagitis: Secondary | ICD-10-CM | POA: Diagnosis not present

## 2016-08-31 DIAGNOSIS — M069 Rheumatoid arthritis, unspecified: Secondary | ICD-10-CM

## 2016-08-31 DIAGNOSIS — L89154 Pressure ulcer of sacral region, stage 4: Secondary | ICD-10-CM

## 2016-08-31 DIAGNOSIS — G8220 Paraplegia, unspecified: Secondary | ICD-10-CM
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
## 2016-09-17 ENCOUNTER — Non-Acute Institutional Stay (SKILLED_NURSING_FACILITY): Payer: Medicare Other | Admitting: Internal Medicine

## 2016-09-17 DIAGNOSIS — E876 Hypokalemia: Secondary | ICD-10-CM

## 2016-09-17 DIAGNOSIS — M25511 Pain in right shoulder: Secondary | ICD-10-CM | POA: Diagnosis not present
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
## 2016-09-18 LAB — BASIC METABOLIC PANEL
BUN: 11 mg/dL (ref 4–21)
CREATININE: 0.6 mg/dL (ref 0.5–1.1)
GLUCOSE: 136 mg/dL
POTASSIUM: 3.2 mmol/L — AB (ref 3.4–5.3)
Sodium: 146 mmol/L (ref 137–147)

## 2016-09-21 ENCOUNTER — Encounter: Payer: Self-pay | Admitting: Adult Health

## 2016-09-21 ENCOUNTER — Non-Acute Institutional Stay (SKILLED_NURSING_FACILITY): Payer: Medicare Other | Admitting: Adult Health

## 2016-09-21 DIAGNOSIS — E876 Hypokalemia: Secondary | ICD-10-CM | POA: Insufficient documentation

## 2016-09-21 NOTE — Progress Notes (Signed)
Location:   [facility] Room Number: 127 C Place of Service:  SNF (31)   CODE STATUS:  Full Code  No Known Allergies  Chief Complaint  Patient presents with  . Medical Management of Chronic Issues    Lab review    HPI:  Her k+ is 3.2 she is not voicing any complaints at this time. She will require supplementation   Past Medical History:  Diagnosis Date  . Anemia 10/12/2008  . CHF (congestive heart failure) ([place])    last Echo 2016?  Marland Kitchen Diaphragmatic hernia without mention of obstruction or gangrene 08/29/2010  . Edema 05/20/2009  . Gastroparesis 10/05/2010  . GERD (gastroesophageal reflux disease) 02/02/2009  . Hypertension 10/12/2008  . Hypotension, unspecified 05/20/2009  . Immobility syndrome (paraplegic) 1977  . Iron deficiency anemia, unspecified 10/12/2008  . Leiomyoma of uterus   . Malignant neoplasm of breast (female), unspecified site 10/05/2010   2003, 2013   . Rheumatoid arthritis ([place]) 01/17/2009    Past Surgical History:  Procedure Laterality Date  . [place]  . BREAST SURGERY  2003   Bilateral mastectomy  . PRESSURE ULCER DEBRIDEMENT  2009   on back  . WOUND DEBRIDEMENT  09/02/2008   Large sacral back open wound    Social History   Social History  . Marital status: Single    Spouse name: N/A  . Number of children: N/A  . Years of education: N/A   Occupational History  . Not on file.   Social History Main Topics  . Smoking status: Former Smoker    Quit date: 07/28/1995  . Smokeless tobacco: Never Used  . Alcohol use No  . Drug use: No  . Sexual activity: Not on file   Other Topics Concern  . 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     Has no children.     Education: Law degree.   Family History  Problem Relation Age of Onset  . Stroke Mother   . Hypertension Mother   . Cancer Maternal Aunt     unsure of what kind  .  Hypertension Maternal Aunt   . Hypertension Maternal Uncle       VITAL SIGNS BP 131/74   Pulse 72   Temp 98.3 F (36.8 C)   Resp 16   Ht 5' (1.524 m)   Wt 160 lb (72.6 kg)   SpO2 96% Comment: room air  BMI 31.25 kg/m   Patient's Medications  New Prescriptions   No medications on file  Previous Medications   ACETAMINOPHEN (TYLENOL) 325 MG TABLET    Take 650 mg by mouth every 6 (six) hours as needed.   AMINO ACIDS-PROTEIN HYDROLYS (FEEDING SUPPLEMENT, PRO-STAT SUGAR FREE 64,) LIQD    Take 30 mLs by mouth. 30 cc BID PO QD   BACLOFEN (LIORESAL) 10 MG TABLET    Take 10 mg by mouth 2 (two) times daily.    BISACODYL (DULCOLAX) 10 MG SUPPOSITORY    Place 10 mg rectally every other day.    CALCIUM-VITAMIN D (OYSTER CALCIUM 500 + D) 500-200 MG-UNIT PER TABLET    Take 1 tablet by mouth 2 (two) times daily.    CARVEDILOL (COREG) 6.25 MG TABLET    Take 6.25 mg by mouth daily. Hold if systolic BP is less than 419   CETIRIZINE (ZYRTEC ALLERGY) 10 MG TABLET  Take 10 mg by mouth daily as needed for allergies.    CRANBERRY 425 MG CAPS    Take 425 mg by mouth daily.   DIPHENHYDRAMINE (BENADRYL) 25 MG TABLET    Take 25 mg by mouth every 8 (eight) hours as needed for itching.   ESCITALOPRAM (LEXAPRO) 5 MG TABLET    Take 15 mg by mouth daily.   FLUTICASONE (FLONASE) 50 MCG/ACT NASAL SPRAY    Place 2 sprays into both nostrils daily as needed for allergies.    HYDROCODONE-ACETAMINOPHEN (NORCO) 7.5-325 MG TABLET    Take 1 tablet by mouth every 6 (six) hours as needed for moderate pain. Take one tablet by mouth every 4 hours as needed for pain DO NOT EXCEED 3 GM APAP FROM ALL SOURCES 24HR   HYDROCORTISONE 2.5 % CREAM    Apply 1 application topically 2 (two) times daily. To rash   HYDROXYCHLOROQUINE (PLAQUENIL) 200 MG TABLET    Take 200 mg by mouth daily.    LIDOCAINE (LIDODERM EX)    Apply topically. Apply to right shoulder topically every 12 hours for pain. On in AM and off in PM   LINACLOTIDE  (LINZESS) 290 MCG CAPS CAPSULE    Take 1 capsule (290 mcg total) by mouth daily.   LOSARTAN (COZAAR) 25 MG TABLET    Take 25 mg by mouth daily.   METHOCARBAMOL (ROBAXIN) 500 MG TABLET    Take 500 mg by mouth every 8 (eight) hours as needed for muscle spasms.    METOCLOPRAMIDE (REGLAN) 5 MG TABLET    Take 5 mg by mouth 3 (three) times daily.   MULTIPLE VITAMINS-MINERALS (DECUBI-VITE) CAPS    Take 1 capsule by mouth daily.   OMEPRAZOLE (PRILOSEC) 20 MG CAPSULE    Take 20 mg by mouth daily.   POVIDONE-IODINE (BETADINE) 10 % EXTERNAL SOLUTION    Apply to sacrum topically every day shift for wound care clean area,soak iodoform packing strips in betadine pack wound daily. Cover with dry protective dressing   PRENATAL MULTIVIT-MIN-FE-FA (PRENATAL 1 + IRON PO)    Take 1 tablet by mouth daily.   SALICYLIC ACID (NEUTROGENA OIL-FREE ACNE [place] EX)    Apply 1 application topically every 8 (eight) hours as needed (for acne on face).    SENNOSIDES-DOCUSATE SODIUM (SENOKOT-S) 8.6-50 MG TABLET    Take 2 tablets by mouth at bedtime.   TORSEMIDE (DEMADEX) 10 MG TABLET    Take 10 mg by mouth daily.   TRAZODONE (DESYREL) 50 MG TABLET    Take 50 mg by mouth at bedtime.   Modified Medications   No medications on file  Discontinued Medications   No medications on file     SIGNIFICANT DIAGNOSTIC EXAMS  11-02-14: 2-d echo: Left ventricle: The cavity size was normal. Wall thickness was normal. Systolic function was normal. The estimated ejection fraction was in the range of 60% to 65%. Doppler parameters are consistent with abnormal left ventricular relaxation (grade 1 diastolic dysfunction). - Mitral valve: There was mild regurgitation.  01-03-15: ct of abdomen and pelvis: 1. Right pleural effusion and asymmetric right basilar airspace disease, likely atelectasis. This could represent diaphragmatic irritation and be related to the right shoulder pain. 2. Large hiatal hernia. 3. Stable calcified cystic mass in the  spleen. 4. Stable calcified fibroid. 5. Previously noted fat density mass within the colon is no longer present. 6. Large hiatal hernia.  06-10-16: chest x-ray; Rounded opacity in left retrocardiac lung base. Left lower lobe mass cannot  be excluded. Recommend chest CT with contrast for further evaluation.  06-10-16: ct of chest abdomen and pelvis: No retrocardiac density seen on same day radiograph is attributabel  to a known moderate to large-sized hiatal hernia. No pulmonary mass. 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  Slightly enlarged calcified uterine fibroids some of which are calcified. Mildly enlarged obturator lymph nodes since prior exam possibly reactive. No definite metastatic disease. Lower lumbar fusion from L3 through S1 with either old posttraumatic deformity of T8 with adjacent rib fusion or due to a developmental vertebral body anomaly.  07-28-16: chest x-ray: no acute cardiopulmonary process   09-18-16: right shoulder x-ray: no acute osseous abnormality  09-18-16: left shoulder x-ray: no acute osseous abnormality    LABS REVIEWED:      11-04-15: pre-albumin 18  11-27-15: wbc 4.7; hgb 13.6;hct 40.0; mcv 83.0; plt 130; glucose 113; bun 10; creat 0.47; k+ 3.8; na++140; ast 41; alt 70; albumin 3.2  11-29-15: hgb a1c 6.0  03-20-16: glucose 114; bun 11; creat 0.54; k+ 3.7; na++ 141  06-05-16: wbc 6.0; hgb 14.4; hct 45.2; mcv 87.9; plt 173; glucose 99; bun 14.2; creat 0.54; k+ 3.3; na++ 141; ast 48; alt 80; alk phos 79; albumin 3.6 hgb a1c 6.3 06-10-16: wbc 14.1' hgb 15.1; hct 44.6; mcv 83.8; plt 182; glucose 172; bun 16; creat 0.85; k+ 4.7; na++ 134; ast 101; alt 97; alk phos 78; albumin 3.8; 1 of 2 blood culture: streptococcus; urine culture 80,000 e-coli and >100,000 aerococcus urinae  06-11-16: hgb a1c 6.1 06-12-16: wbc 7.4; hgb 10.9; hct 33.5; mcv 85.0; plt 105; glucose 126; bun 7; creat 0.55; k+ 3.6; na++ 139; liver normal albumin 2.4 06-13-16: wbc 7.4; hgb 11.7; hct 36.5;  mcv 85.1; plt 138; glucose 79; bun 5; creat 0.56; k+ 3.4 na++ 139  06-22-16: wbc 6.5; hgb 13.3; hct 42.2; mcv 87.9; plt 208; glucose 120; bun 11.6; creat 0.63; k+ 4.1; na++ 135  07-01-16: glucose 123; bun 14.5; creat 0.49; k+ 3.1; na++ 140  07-28-16: wbc 8.2; hgb 13.7; hct 42.6; mcv 85.9; plt 218  glucose 74; bun 13.4; creat 0.52; k+ 3.6; na++136; liver normal albumin 3.5  08-20-16: urine culture; providencia stuartii: rocephin  09-18-16: glucose 136; bun 10.8; creat 0.61; k+ 3.2; na++ 146    Review of Systems Constitutional: Negative for appetite change and fatigue.   HENT no sore throat     Respiratory: Negative for  chest tightness no cough Cardiovascular: Negative for chest pain, palpitations and leg swelling.  Gastrointestinal: Negative for nausea, abdominal pain,no constipation Musculoskeletal: Negative for myalgias and arthralgias.  Skin: Negative for pallor. has chronic sacral wound   Neurological: Negative for dizziness.  Psychiatric/Behavioral: The patient is not nervous/anxious.         Physical Exam Constitutional: She is oriented to person, place, and time. She appears well-developed and well-nourished. No distress.  Neck: Neck supple. No JVD present. No thyromegaly present.  Cardiovascular: Normal rate, regular rhythm and intact distal pulses.   Respiratory: Effort normal lungs clear throughout   GI: Bowel sounds are normal. No distention  There is no tenderness.    Musculoskeletal: She exhibits no edema.  Able to move upper extremities; is paraplegic   Neurological: She is alert and oriented to person, place, and time.  Skin: Skin is warm and dry. She is not diaphoretic. Coccyx stage IV: 0.4 x 0.4 x 0.13 cm santyl and silver alginate   Psychiatric: She has a normal mood and affect   ASSESSMENT/  PLAN:  1. Hypokalemia: k+ 3.2 will begin k+ 20 meq daily and will repeat k+ in one week.   MD is aware of resident's narcotic use and is in agreement with current plan of  care. We will attempt to wean resident as apropriate    Ok Edwards NP [facility]  Contact [phone] Monday through Friday 8am- 5pm  After hours call [phone]

## 2016-10-15 LAB — BASIC METABOLIC PANEL
BUN: 13 mg/dL (ref 4–21)
CREATININE: 0.6 mg/dL (ref 0.5–1.1)
Glucose: 114 mg/dL
POTASSIUM: 4.6 mmol/L (ref 3.4–5.3)
SODIUM: 141 mmol/L (ref 137–147)

## 2016-12-10 LAB — HEPATIC FUNCTION PANEL
ALT: 32 U/L (ref 7–35)
AST: 25 U/L (ref 13–35)
Alkaline Phosphatase: 120 U/L (ref 25–125)
Bilirubin, Total: 0.3 mg/dL

## 2016-12-10 LAB — BASIC METABOLIC PANEL
BUN: 10 mg/dL (ref 4–21)
Creatinine: 0.6 mg/dL (ref 0.5–1.1)
Glucose: 87 mg/dL
POTASSIUM: 4.4 mmol/L (ref 3.4–5.3)
SODIUM: 146 mmol/L (ref 137–147)

## 2016-12-15 ENCOUNTER — Non-Acute Institutional Stay (SKILLED_NURSING_FACILITY): Payer: Medicare Other | Admitting: Internal Medicine

## 2016-12-15 ENCOUNTER — Encounter: Payer: Self-pay | Admitting: Internal Medicine

## 2016-12-15 DIAGNOSIS — N319 Neuromuscular dysfunction of bladder, unspecified: Secondary | ICD-10-CM

## 2016-12-15 DIAGNOSIS — M069 Rheumatoid arthritis, unspecified: Secondary | ICD-10-CM | POA: Diagnosis not present

## 2016-12-15 DIAGNOSIS — I10 Essential (primary) hypertension: Secondary | ICD-10-CM

## 2016-12-15 DIAGNOSIS — G8220 Paraplegia, unspecified: Secondary | ICD-10-CM

## 2016-12-15 DIAGNOSIS — K3184 Gastroparesis: Secondary | ICD-10-CM

## 2016-12-15 DIAGNOSIS — I5032 Chronic diastolic (congestive) heart failure: Secondary | ICD-10-CM

## 2016-12-15 DIAGNOSIS — L89153 Pressure ulcer of sacral region, stage 3: Secondary | ICD-10-CM
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
## 2017-01-07 ENCOUNTER — Non-Acute Institutional Stay (SKILLED_NURSING_FACILITY): Payer: Medicare Other

## 2017-01-07 DIAGNOSIS — Z0000 Encounter for general adult medical examination without abnormal findings: Secondary | ICD-10-CM | POA: Diagnosis not present

## 2017-01-07 NOTE — Progress Notes (Signed)
Subjective:   [name] is a 66 y.o. female who presents for an Initial Medicare Annual Wellness Visit at [facility]    Objective:    Today's Vitals   01/07/17 1227  BP: 140/90  Pulse: 79  Temp: 98.4 F (36.9 C)  TempSrc: Oral  SpO2: 96%  Weight: 157 lb (71.2 kg)  Height: 5' (1.524 m)   Body mass index is 30.66 kg/m.   Current Medications (verified) Outpatient Encounter Prescriptions as of 01/07/2017  Medication Sig  . acetaminophen (TYLENOL) 325 MG tablet Take 650 mg by mouth every 6 (six) hours as needed.  . Amino Acids-Protein Hydrolys (FEEDING SUPPLEMENT, PRO-STAT SUGAR FREE 64,) LIQD Take 30 mLs by mouth. 30 cc BID PO QD  . bisacodyl (DULCOLAX) 10 MG suppository Place 10 mg rectally every other day.   . calcium carbonate (TUMS - DOSED IN MG ELEMENTAL CALCIUM) 500 MG chewable tablet Chew 2 tablets by mouth every 8 (eight) hours as needed for indigestion or heartburn.  . calcium-vitamin D (OYSTER CALCIUM 500 + D) 500-200 MG-UNIT per tablet Take 1 tablet by mouth 2 (two) times daily.   . carvedilol (COREG) 6.25 MG tablet Take 6.25 mg by mouth daily. Hold if systolic BP is less than 161  . cetirizine (ZYRTEC ALLERGY) 10 MG tablet Take 10 mg by mouth daily as needed for allergies.   . cholecalciferol (VITAMIN D) 1000 units tablet Take 1,000 Units by mouth daily.  . Cranberry 425 MG CAPS Take 425 mg by mouth daily.  . cyclobenzaprine (FLEXERIL) 10 MG tablet Take 10 mg by mouth daily as needed for muscle spasms.  . diphenhydrAMINE (BENADRYL) 25 MG tablet Take 25 mg by mouth every 8 (eight) hours as needed for itching.  . escitalopram (LEXAPRO) 5 MG tablet Take 15 mg by mouth daily.  . fluticasone (FLONASE) 50 MCG/ACT nasal spray Place 2 sprays into both nostrils daily as needed for allergies.   Marland Kitchen HYDROcodone-acetaminophen (NORCO) 7.5-325 MG tablet Take 1 tablet by mouth every 6 (six) hours as needed for moderate pain.  . hydrocortisone 2.5 % cream Apply 1  application topically 2 (two) times daily. To rash  . hydroxychloroquine (PLAQUENIL) 200 MG tablet Take 100 mg by mouth daily.   . Lidocaine (LIDODERM EX) Apply topically. Apply to right shoulder topically every 12 hours for pain. On in AM and off in PM  . Linaclotide (LINZESS) 290 MCG CAPS capsule Take 1 capsule (290 mcg total) by mouth daily.  Marland Kitchen losartan (COZAAR) 25 MG tablet Take 25 mg by mouth daily.  . methocarbamol (ROBAXIN) 750 MG tablet Take 750 mg by mouth 2 (two) times daily.   . metoCLOPramide (REGLAN) 5 MG tablet Take 5 mg by mouth 3 (three) times daily.  . Multiple Vitamins-Minerals (DECUBI-VITE) CAPS Take 1 capsule by mouth daily.  Marland Kitchen omeprazole (PRILOSEC) 20 MG capsule Take 20 mg by mouth daily.  . Prenatal Multivit-Min-Fe-FA (PRENATAL 1 + IRON PO) Take 1 tablet by mouth daily.  . promethazine (PHENERGAN) 25 MG tablet Take 25 mg by mouth every 6 (six) hours as needed for nausea or vomiting.  . Salicylic Acid (NEUTROGENA OIL-FREE ACNE WASH EX) Apply 1 application topically every 8 (eight) hours as needed (for acne on face).   . sennosides-docusate sodium (SENOKOT-S) 8.6-50 MG tablet Take 2 tablets by mouth at bedtime.  Marland Kitchen tiZANidine (ZANAFLEX) 2 MG tablet Take by mouth. Give 2 mg one time a day, give 6 mg at bedtime  .  traZODone (DESYREL) 50 MG tablet Take 50 mg by mouth at bedtime.    No facility-administered encounter medications on file as of 01/07/2017.     Allergies (verified) Patient has no known allergies.   History: Past Medical History:  Diagnosis Date  . Anemia 10/12/2008  . CHF (congestive heart failure) ([place])    last Echo 2016?  Marland Kitchen Diaphragmatic hernia without mention of obstruction or gangrene 08/29/2010  . Edema 05/20/2009  . Gastroparesis 10/05/2010  . GERD (gastroesophageal reflux disease) 02/02/2009  . Hypertension 10/12/2008  . Hypotension, unspecified 05/20/2009  . Immobility syndrome (paraplegic) 1977  . Iron deficiency anemia, unspecified 10/12/2008    . Leiomyoma of uterus   . Malignant neoplasm of breast (female), unspecified site 10/05/2010   2003, 2013   . Rheumatoid arthritis ([place]) 01/17/2009   Past Surgical History:  Procedure Laterality Date  . [place]  . BREAST SURGERY  2003   Bilateral mastectomy  . PRESSURE ULCER DEBRIDEMENT  2009   on back  . WOUND DEBRIDEMENT  09/02/2008   Large sacral back open wound   Family History  Problem Relation Age of Onset  . Stroke Mother   . Hypertension Mother   . Cancer Maternal Aunt        unsure of what kind  . Hypertension Maternal Aunt   . Hypertension Maternal Uncle    Social History   Occupational History  . Not on file.   Social History Main Topics  . Smoking status: Former Smoker    Packs/day: 1.00    Years: 10.00    Quit date: 07/28/1995  . Smokeless tobacco: Never Used  . Alcohol use No  . Drug use: No  . Sexual activity: Not on file    Tobacco Counseling Counseling given: Not Answered   Activities of Daily Living In your present state of health, do you have any difficulty performing the following activities: 01/07/2017 01/07/2017  Hearing? - N  Vision? - N  Difficulty concentrating or making decisions? - N  Walking or climbing stairs? (No Data) Y  Dressing or bathing? - Y  Doing errands, shopping? - Y  Preparing Food and eating ? - Y  Using the Toilet? - Y  In the past six months, have you accidently leaked urine? - Y  Do you have problems with loss of bowel control? - Y  Managing your Medications? - Y  Managing your Finances? - Y  Housekeeping or managing your Housekeeping? - Y  Some recent data might be hidden    Immunizations and Health Maintenance  There is no immunization history on file for this patient. Health Maintenance Due  Topic Date Due  . PNA vac Low Risk Adult (1 of 2 - PCV13) 07/31/2015    Patient Care Team: [facility] [place] ([place])  Indicate any recent Newborn  you may have received from other than [facility] providers in the past year (date may be approximate).     Assessment:   This is a routine wellness examination for [place].   Hearing/Vision screen No exam data present  Dietary issues and exercise activities discussed: Current Exercise Habits: The patient does not participate in regular exercise at present, Exercise limited by: None identified  Goals    . Maintain Lifestyl          Pt will maintain lifestyle.       Depression Screen PHQ 2/9 Scores 01/07/2017 09/17/2014  PHQ - 2 Score  0 0    Fall Risk Fall Risk  01/07/2017 06/10/2016 09/17/2014  Falls in the past year? No Exclusion - non ambulatory No    Cognitive Function:     6CIT Screen 01/07/2017  What Year? 0 points  What month? 0 points  What time? 0 points  Count back from 20 0 points  Months in reverse 0 points  Repeat phrase 2 points  Total Score 2    Screening Tests Health Maintenance  Topic Date Due  . PNA vac Low Risk Adult (1 of 2 - PCV13) 07/31/2015  . INFLUENZA VACCINE  06/12/2027 (Originally 02/24/2017)  . TETANUS/TDAP  09/17/2024  . COLONOSCOPY  01/16/2026  . DEXA SCAN  Excluded      Plan:    I have personally reviewed and addressed the Medicare Annual Wellness questionnaire and have noted the following in the patient's chart:  A. Medical and social history B. Use of alcohol, tobacco or illicit drugs  C. Current medications and supplements D. Functional ability and status E.  Nutritional status F.  Physical activity G. Advance directives H. List of other physicians I.  Hospitalizations, surgeries, and ER visits in previous 12 months J.  [place] to include hearing, vision, cognitive, depression L. Referrals and appointments - none  In addition, I have reviewed and discussed with patient certain preventive protocols, quality metrics, and best practice recommendations. A written personalized care plan for preventive services as well as  general preventive health recommendations were provided to patient.  See attached scanned questionnaire for additional information.   Signed,   Rich Reining, RN Nurse Health Advisor   Quick Notes   Health Maintenance: PNA 13, DEXA due     Abnormal Screen: 6 CIT-2     Patient Concerns: None     Nurse Concerns: None

## 2017-01-07 NOTE — Patient Instructions (Signed)
[name] , Thank you for taking time to come for your Medicare Wellness Visit. I appreciate your ongoing commitment to your health goals. Please review the following plan we discussed and let me know if I can assist you in the future.   Screening recommendations/referrals: Colonoscopy up to date. Long term pt Mammogram up to date. Long term pt Bone Density due Recommended yearly ophthalmology/optometry visit for glaucoma screening and checkup Recommended yearly dental visit for hygiene and checkup  Vaccinations: Influenza vaccine up to date Pneumococcal vaccine 13 due Tdap vaccine due Shingles vaccine not in records   Advanced directives: Need copy for chart  Conditions/risks identified: None  Next appointment: None upcoming   Preventive Care 63 Years and Older, Female Preventive care refers to lifestyle choices and visits with your health care provider that can promote health and wellness. What does preventive care include?  A yearly physical exam. This is also called an annual well check.  Dental exams once or twice a year.  Routine eye exams. Ask your health care provider how often you should have your eyes checked.  Personal lifestyle choices, including:  Daily care of your teeth and gums.  Regular physical activity.  Eating a healthy diet.  Avoiding tobacco and drug use.  Limiting alcohol use.  Practicing safe sex.  Taking low-dose aspirin every day.  Taking vitamin and mineral supplements as recommended by your health care provider. What happens during an annual well check? The services and screenings done by your health care provider during your annual well check will depend on your age, overall health, lifestyle risk factors, and family history of disease. Counseling  Your health care provider may ask you questions about your:  Alcohol use.  Tobacco use.  Drug use.  Emotional well-being.  Home and relationship well-being.  Sexual  activity.  Eating habits.  History of falls.  Memory and ability to understand (cognition).  Work and work Statistician.  Reproductive health. Screening  You may have the following tests or measurements:  Height, weight, and BMI.  Blood pressure.  Lipid and cholesterol levels. These may be checked every 5 years, or more frequently if you are over 4 years old.  Skin check.  Lung cancer screening. You may have this screening every year starting at age 46 if you have a 30-pack-year history of smoking and currently smoke or have quit within the past 15 years.  Fecal occult blood test (FOBT) of the stool. You may have this test every year starting at age 54.  Flexible sigmoidoscopy or colonoscopy. You may have a sigmoidoscopy every 5 years or a colonoscopy every 10 years starting at age 85.  Hepatitis C blood test.  Hepatitis B blood test.  Sexually transmitted disease (STD) testing.  Diabetes screening. This is done by checking your blood sugar (glucose) after you have not eaten for a while (fasting). You may have this done every 1-3 years.  Bone density scan. This is done to screen for osteoporosis. You may have this done starting at age 23.  Mammogram. This may be done every 1-2 years. Talk to your health care provider about how often you should have regular mammograms. Talk with your health care provider about your test results, treatment options, and if necessary, the need for more tests. Vaccines  Your health care provider may recommend certain vaccines, such as:  Influenza vaccine. This is recommended every year.  Tetanus, diphtheria, and acellular pertussis (Tdap, Td) vaccine. You may need a Td booster every 10  years.  Zoster vaccine. You may need this after age 103.  Pneumococcal 13-valent conjugate (PCV13) vaccine. One dose is recommended after age 32.  Pneumococcal polysaccharide (PPSV23) vaccine. One dose is recommended after age 44. Talk to your health care  provider about which screenings and vaccines you need and how often you need them. This information is not intended to replace advice given to you by your health care provider. Make sure you discuss any questions you have with your health care provider. Document Released: 08/09/2015 Document Revised: 04/01/2016 Document Reviewed: 05/14/2015 Elsevier Interactive Patient Education  2017 [place] Prevention in the Home Falls can cause injuries. They can happen to people of all ages. There are many things you can do to make your home safe and to help prevent falls. What can I do on the outside of my home?  Regularly fix the edges of walkways and driveways and fix any cracks.  Remove anything that might make you trip as you walk through a door, such as a raised step or threshold.  Trim any bushes or trees on the path to your home.  Use bright outdoor lighting.  Clear any walking paths of anything that might make someone trip, such as rocks or tools.  Regularly check to see if handrails are loose or broken. Make sure that both sides of any steps have handrails.  Any raised decks and porches should have guardrails on the edges.  Have any leaves, snow, or ice cleared regularly.  Use sand or salt on walking paths during winter.  Clean up any spills in your garage right away. This includes oil or grease spills. What can I do in the bathroom?  Use night lights.  Install grab bars by the toilet and in the tub and shower. Do not use towel bars as grab bars.  Use non-skid mats or decals in the tub or shower.  If you need to sit down in the shower, use a plastic, non-slip stool.  Keep the floor dry. Clean up any water that spills on the floor as soon as it happens.  Remove soap buildup in the tub or shower regularly.  Attach bath mats securely with double-sided non-slip rug tape.  Do not have throw rugs and other things on the floor that can make you trip. What can I do in  the bedroom?  Use night lights.  Make sure that you have a light by your bed that is easy to reach.  Do not use any sheets or blankets that are too big for your bed. They should not hang down onto the floor.  Have a firm chair that has side arms. You can use this for support while you get dressed.  Do not have throw rugs and other things on the floor that can make you trip. What can I do in the kitchen?  Clean up any spills right away.  Avoid walking on wet floors.  Keep items that you use a lot in easy-to-reach places.  If you need to reach something above you, use a strong step stool that has a grab bar.  Keep electrical cords out of the way.  Do not use floor polish or wax that makes floors slippery. If you must use wax, use non-skid floor wax.  Do not have throw rugs and other things on the floor that can make you trip. What can I do with my stairs?  Do not leave any items on the stairs.  Make sure that  there are handrails on both sides of the stairs and use them. Fix handrails that are broken or loose. Make sure that handrails are as long as the stairways.  Check any carpeting to make sure that it is firmly attached to the stairs. Fix any carpet that is loose or worn.  Avoid having throw rugs at the top or bottom of the stairs. If you do have throw rugs, attach them to the floor with carpet tape.  Make sure that you have a light switch at the top of the stairs and the bottom of the stairs. If you do not have them, ask someone to add them for you. What else can I do to help prevent falls?  Wear shoes that:  Do not have high heels.  Have rubber bottoms.  Are comfortable and fit you well.  Are closed at the toe. Do not wear sandals.  If you use a stepladder:  Make sure that it is fully opened. Do not climb a closed stepladder.  Make sure that both sides of the stepladder are locked into place.  Ask someone to hold it for you, if possible.  Clearly mark and  make sure that you can see:  Any grab bars or handrails.  First and last steps.  Where the edge of each step is.  Use tools that help you move around (mobility aids) if they are needed. These include:  Canes.  Walkers.  Scooters.  Crutches.  Turn on the lights when you go into a dark area. Replace any light bulbs as soon as they burn out.  Set up your furniture so you have a clear path. Avoid moving your furniture around.  If any of your floors are uneven, fix them.  If there are any pets around you, be aware of where they are.  Review your medicines with your doctor. Some medicines can make you feel dizzy. This can increase your chance of falling. Ask your doctor what other things that you can do to help prevent falls. This information is not intended to replace advice given to you by your health care provider. Make sure you discuss any questions you have with your health care provider. Document Released: 05/09/2009 Document Revised: 12/19/2015 Document Reviewed: 08/17/2014 Elsevier Interactive Patient Education  2017 [employer].

## 2017-06-23 ENCOUNTER — Other Ambulatory Visit (HOSPITAL_COMMUNITY): Payer: Self-pay | Admitting: Internal Medicine

## 2017-06-23 ENCOUNTER — Encounter (HOSPITAL_COMMUNITY): Payer: Self-pay | Admitting: Diagnostic Radiology

## 2017-06-23 ENCOUNTER — Other Ambulatory Visit (HOSPITAL_COMMUNITY): Payer: Self-pay | Admitting: Family Medicine

## 2017-06-23 ENCOUNTER — Ambulatory Visit (HOSPITAL_COMMUNITY)
Admission: RE | Admit: 2017-06-23 | Discharge: 2017-06-23 | Disposition: A | Discharge status: Home / Self Care | Payer: Medicare Other | Source: Ambulatory Visit | Attending: Family Medicine | Admitting: Family Medicine

## 2017-06-23 DIAGNOSIS — J189 Pneumonia, unspecified organism: Secondary | ICD-10-CM | POA: Insufficient documentation

## 2017-06-23 DIAGNOSIS — Z789 Other specified health status: Secondary | ICD-10-CM

## 2017-06-23 DIAGNOSIS — R1310 Dysphagia, unspecified: Secondary | ICD-10-CM

## 2017-06-23 HISTORY — PX: IR US GUIDE VASC ACCESS RIGHT: IMG2390

## 2017-06-23 HISTORY — PX: IR FLUORO GUIDE CV LINE RIGHT: IMG2283

## 2017-06-23 MED ORDER — HEPARIN SOD (PORK) LOCK FLUSH 100 UNIT/ML IV SOLN
INTRAVENOUS | Status: DC
Start: 2017-06-23 — End: 2017-06-24
  Filled 2017-06-23: qty 5

## 2017-06-23 MED ORDER — LIDOCAINE HCL 1 % IJ SOLN
INTRAMUSCULAR | Status: DC
Start: 2017-06-23 — End: 2017-06-24
  Filled 2017-06-23: qty 20

## 2017-06-23 MED ORDER — LIDOCAINE HCL 1 % IJ SOLN
INTRAMUSCULAR | Status: DC | PRN
  Administered 2017-06-23: 18 mL

## 2017-06-23 MED ORDER — HEPARIN SOD (PORK) LOCK FLUSH 100 UNIT/ML IV SOLN
INTRAVENOUS | Status: DC | PRN
  Administered 2017-06-23: 500 [IU] via INTRAVENOUS

## 2017-06-23 NOTE — Procedures (Signed)
Placement of right jugular tunneled central line, tip in SVC.  Minimal blood loss and no immediate complication.

## 2017-06-28 ENCOUNTER — Emergency Department (HOSPITAL_COMMUNITY): Payer: Medicare Other

## 2017-06-28 ENCOUNTER — Inpatient Hospital Stay (HOSPITAL_COMMUNITY)
Admission: EM | Admit: 2017-06-28 | Discharge: 2017-07-03 | DRG: 286 | Disposition: A | Discharge status: Skilled Nursing Facility | Payer: Medicare Other | Attending: Internal Medicine | Admitting: Internal Medicine

## 2017-06-28 ENCOUNTER — Encounter (HOSPITAL_COMMUNITY): Payer: Self-pay | Admitting: Internal Medicine

## 2017-06-28 DIAGNOSIS — Z993 Dependence on wheelchair: Secondary | ICD-10-CM

## 2017-06-28 DIAGNOSIS — K219 Gastro-esophageal reflux disease without esophagitis: Secondary | ICD-10-CM | POA: Diagnosis present

## 2017-06-28 DIAGNOSIS — D509 Iron deficiency anemia, unspecified: Secondary | ICD-10-CM | POA: Diagnosis present

## 2017-06-28 DIAGNOSIS — J9601 Acute respiratory failure with hypoxia: Secondary | ICD-10-CM

## 2017-06-28 DIAGNOSIS — E876 Hypokalemia: Secondary | ICD-10-CM | POA: Diagnosis present

## 2017-06-28 DIAGNOSIS — I5021 Acute systolic (congestive) heart failure: Secondary | ICD-10-CM

## 2017-06-28 DIAGNOSIS — Z8701 Personal history of pneumonia (recurrent): Secondary | ICD-10-CM

## 2017-06-28 DIAGNOSIS — R0602 Shortness of breath: Secondary | ICD-10-CM

## 2017-06-28 DIAGNOSIS — M24562 Contracture, left knee: Secondary | ICD-10-CM | POA: Diagnosis present

## 2017-06-28 DIAGNOSIS — K3184 Gastroparesis: Secondary | ICD-10-CM | POA: Diagnosis present

## 2017-06-28 DIAGNOSIS — Z9013 Acquired absence of bilateral breasts and nipples: Secondary | ICD-10-CM

## 2017-06-28 DIAGNOSIS — J9691 Respiratory failure, unspecified with hypoxia: Secondary | ICD-10-CM | POA: Diagnosis present

## 2017-06-28 DIAGNOSIS — M359 Systemic involvement of connective tissue, unspecified: Secondary | ICD-10-CM | POA: Diagnosis present

## 2017-06-28 DIAGNOSIS — Z7951 Long term (current) use of inhaled steroids: Secondary | ICD-10-CM

## 2017-06-28 DIAGNOSIS — Z79899 Other long term (current) drug therapy: Secondary | ICD-10-CM

## 2017-06-28 DIAGNOSIS — J90 Pleural effusion, not elsewhere classified: Secondary | ICD-10-CM | POA: Diagnosis not present

## 2017-06-28 DIAGNOSIS — M069 Rheumatoid arthritis, unspecified: Secondary | ICD-10-CM | POA: Diagnosis not present

## 2017-06-28 DIAGNOSIS — G8220 Paraplegia, unspecified: Secondary | ICD-10-CM | POA: Diagnosis present

## 2017-06-28 DIAGNOSIS — L8990 Pressure ulcer of unspecified site, unspecified stage: Secondary | ICD-10-CM

## 2017-06-28 DIAGNOSIS — Z87891 Personal history of nicotine dependence: Secondary | ICD-10-CM

## 2017-06-28 DIAGNOSIS — I10 Essential (primary) hypertension: Secondary | ICD-10-CM | POA: Diagnosis present

## 2017-06-28 DIAGNOSIS — Z9889 Other specified postprocedural states: Secondary | ICD-10-CM

## 2017-06-28 DIAGNOSIS — I509 Heart failure, unspecified: Secondary | ICD-10-CM

## 2017-06-28 DIAGNOSIS — L89152 Pressure ulcer of sacral region, stage 2: Secondary | ICD-10-CM | POA: Diagnosis present

## 2017-06-28 DIAGNOSIS — K449 Diaphragmatic hernia without obstruction or gangrene: Secondary | ICD-10-CM | POA: Diagnosis present

## 2017-06-28 DIAGNOSIS — Z9221 Personal history of antineoplastic chemotherapy: Secondary | ICD-10-CM

## 2017-06-28 DIAGNOSIS — M24552 Contracture, left hip: Secondary | ICD-10-CM | POA: Diagnosis present

## 2017-06-28 DIAGNOSIS — Z9981 Dependence on supplemental oxygen: Secondary | ICD-10-CM

## 2017-06-28 DIAGNOSIS — I5043 Acute on chronic combined systolic (congestive) and diastolic (congestive) heart failure: Secondary | ICD-10-CM | POA: Diagnosis present

## 2017-06-28 DIAGNOSIS — Z853 Personal history of malignant neoplasm of breast: Secondary | ICD-10-CM

## 2017-06-28 DIAGNOSIS — J9621 Acute and chronic respiratory failure with hypoxia: Secondary | ICD-10-CM | POA: Diagnosis present

## 2017-06-28 DIAGNOSIS — M24561 Contracture, right knee: Secondary | ICD-10-CM | POA: Diagnosis present

## 2017-06-28 DIAGNOSIS — M24551 Contracture, right hip: Secondary | ICD-10-CM | POA: Diagnosis present

## 2017-06-28 DIAGNOSIS — I5033 Acute on chronic diastolic (congestive) heart failure: Secondary | ICD-10-CM | POA: Diagnosis not present

## 2017-06-28 DIAGNOSIS — I110 Hypertensive heart disease with heart failure: Principal | ICD-10-CM | POA: Diagnosis present

## 2017-06-28 HISTORY — DX: Acute systolic (congestive) heart failure: I50.21

## 2017-06-28 LAB — CBC WITH DIFFERENTIAL/PLATELET
BASOS PCT: 1 %
Basophils Absolute: 0 10*3/uL (ref 0.0–0.1)
EOS ABS: 0.4 10*3/uL (ref 0.0–0.7)
Eosinophils Relative: 6 %
HCT: 44.7 % (ref 36.0–46.0)
HEMOGLOBIN: 14.3 g/dL (ref 12.0–15.0)
LYMPHS ABS: 2.4 10*3/uL (ref 0.7–4.0)
Lymphocytes Relative: 34 %
MCH: 25.4 pg — AB (ref 26.0–34.0)
MCHC: 32 g/dL (ref 30.0–36.0)
MCV: 79.4 fL (ref 78.0–100.0)
Monocytes Absolute: 0.5 10*3/uL (ref 0.1–1.0)
Monocytes Relative: 6 %
NEUTROS PCT: 53 %
Neutro Abs: 3.8 10*3/uL (ref 1.7–7.7)
Platelets: 207 10*3/uL (ref 150–400)
RBC: 5.63 MIL/uL — AB (ref 3.87–5.11)
RDW: 16.3 % — ABNORMAL HIGH (ref 11.5–15.5)
WBC: 7 10*3/uL (ref 4.0–10.5)

## 2017-06-28 LAB — COMPREHENSIVE METABOLIC PANEL
ALK PHOS: 63 U/L (ref 38–126)
ALT: 42 U/L (ref 14–54)
AST: 31 U/L (ref 15–41)
Albumin: 3.3 g/dL — ABNORMAL LOW (ref 3.5–5.0)
Anion gap: 10 (ref 5–15)
BUN: 10 mg/dL (ref 6–20)
CALCIUM: 9.2 mg/dL (ref 8.9–10.3)
CHLORIDE: 107 mmol/L (ref 101–111)
CO2: 26 mmol/L (ref 22–32)
CREATININE: 0.49 mg/dL (ref 0.44–1.00)
Glucose, Bld: 89 mg/dL (ref 65–99)
Potassium: 3.5 mmol/L (ref 3.5–5.1)
Sodium: 143 mmol/L (ref 135–145)
Total Bilirubin: 0.5 mg/dL (ref 0.3–1.2)
Total Protein: 7.6 g/dL (ref 6.5–8.1)

## 2017-06-28 LAB — I-STAT CG4 LACTIC ACID, ED: LACTIC ACID, VENOUS: 0.99 mmol/L (ref 0.5–1.9)

## 2017-06-28 MED ORDER — IOPAMIDOL (ISOVUE-300) INJECTION 61%
INTRAVENOUS | Status: AC
  Administered 2017-06-28: 75 mL
  Filled 2017-06-28: qty 75

## 2017-06-28 NOTE — ED Provider Notes (Signed)
[facility] DEPT Provider Note   CSN: [id number] Arrival date & time: 06/28/17  1820     History   Chief Complaint No chief complaint on file.   HPI [name] is a 66 y.o. female.  HPI [name] is a 66 y.o. female with history of paraplegia, CHF, rheumatoid arthritis, presents to emergency department complaining of worsening pneumonia.  Patient states she started having cough and shortness of breath and was diagnosed with pneumonia by chest x-ray on 10/18.  Since then patient has been on multiple antibiotics according to her, and now even has a port in place for which she is receiving IV antibiotics with no relief.  Today she had a repeat x-ray which showed worsening pneumonia, she was sent here for further treatment.  Patient has not had any fever.  She states her breathing and cough is unchanged.  She is now on 2 L of oxygen at all times.    I discussed patient with a nurse at the facility, patient is from [facility]  The patient has had multiple x-rays which she states have persistently showed worsening right lower lobe pneumonia.  She stated that patient initially was treated with Augmentin from 10/19-10/26.  She was treated with oral clindamycin from 11/9 to 11/14 and oral Levaquin.  She was on vancomycin, Invanz, clindamycin since November 28.   Past Medical History:  Diagnosis Date  . Anemia 10/12/2008  . CHF (congestive heart failure) ([place])    last Echo 2016?  Marland Kitchen Diaphragmatic hernia without mention of obstruction or gangrene 08/29/2010  . Edema 05/20/2009  . Gastroparesis 10/05/2010  . GERD (gastroesophageal reflux disease) 02/02/2009  . Hypertension 10/12/2008  . Hypotension, unspecified 05/20/2009  . Immobility syndrome (paraplegic) 1977  . Iron deficiency anemia, unspecified 10/12/2008  . Leiomyoma of uterus   . Malignant neoplasm of breast (female), unspecified site 10/05/2010   2003, 2013   .  Rheumatoid arthritis ([place]) 01/17/2009    Patient Active Problem List   Diagnosis Date Noted  . Hypokalemia 09/21/2016  . HCAP (healthcare-associated pneumonia) 07/28/2016  . Fever in adult 07/28/2016  . Sepsis ([place]) 06/10/2016  . Decubitus ulcer of coccyx 02/04/2016  . Hyperglycemia 12/03/2015  . H/O bilateral mastectomy 07/25/2015  . Benign hypertensive heart disease without heart failure 07/15/2015  . Hiatal hernia 07/06/2015  . 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 History of breast cancer 08/21/2014  . Adynamic ileus ([place]) 07/22/2014  . Elevated liver enzymes 07/22/2014  . CHF (congestive heart failure) ([place]) 07/05/2014  . Gastroparesis 07/05/2014  . Microcytic anemia 01/05/2014  . Insomnia 07/14/2013  . Edema 05/01/2013  . Rheumatoid arthritis ([place]) 02/23/2013  . GERD (gastroesophageal reflux disease) 02/23/2013  . Neurogenic bladder 02/23/2013  . Paraplegia ([place]) 02/23/2013  . Depression 02/23/2013  . Allergic rhinitis 02/23/2013  . Constipation 02/23/2013  . 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 Breast cancer, left breast ([place]) 09/08/2011    Past Surgical History:  Procedure Laterality Date  . [place]  . BREAST SURGERY  2003   Bilateral mastectomy  . IR FLUORO GUIDE CV LINE RIGHT  06/23/2017  . IR US GUIDE VASC ACCESS RIGHT  06/23/2017  . PRESSURE ULCER DEBRIDEMENT  2009   on back  . WOUND DEBRIDEMENT  09/02/2008   Large sacral back open wound    OB History    No data available  Home Medications    Prior to Admission medications   Medication Sig Start Date End Date Taking? Authorizing Provider  acetaminophen (TYLENOL) 325 MG tablet Take 650 mg by mouth every 6 (six) hours as needed.    [provider]  Amino Acids-Protein Hydrolys (FEEDING SUPPLEMENT, PRO-STAT SUGAR FREE 64,) LIQD Take 30 mLs by mouth. 30 cc BID PO QD    [provider]  bisacodyl  (DULCOLAX) 10 MG suppository Place 10 mg rectally every other day.     [provider]  calcium carbonate (TUMS - DOSED IN MG ELEMENTAL CALCIUM) 500 MG chewable tablet Chew 2 tablets by mouth every 8 (eight) hours as needed for indigestion or heartburn.    [provider]  calcium-vitamin D (OYSTER CALCIUM 500 + D) 500-200 MG-UNIT per tablet Take 1 tablet by mouth 2 (two) times daily.     [provider]  carvedilol (COREG) 6.25 MG tablet Take 6.25 mg by mouth daily. Hold if systolic BP is less than 161    [provider]  cetirizine (ZYRTEC ALLERGY) 10 MG tablet Take 10 mg by mouth daily as needed for allergies.     [provider]  cholecalciferol (VITAMIN D) 1000 units tablet Take 1,000 Units by mouth daily.    [provider]  Cranberry 425 MG CAPS Take 425 mg by mouth daily.    [provider]  cyclobenzaprine (FLEXERIL) 10 MG tablet Take 10 mg by mouth daily as needed for muscle spasms.    [provider]  diphenhydrAMINE (BENADRYL) 25 MG tablet Take 25 mg by mouth every 8 (eight) hours as needed for itching.    [provider]  escitalopram (LEXAPRO) 5 MG tablet Take 15 mg by mouth daily.    [provider]  fluticasone (FLONASE) 50 MCG/ACT nasal spray Place 2 sprays into both nostrils daily as needed for allergies.     [provider]  HYDROcodone-acetaminophen (NORCO) 7.5-325 MG tablet Take 1 tablet by mouth every 6 (six) hours as needed for moderate pain.    [provider]  hydrocortisone 2.5 % cream Apply 1 application topically 2 (two) times daily. To rash    [provider]  hydroxychloroquine (PLAQUENIL) 200 MG tablet Take 100 mg by mouth daily.     [provider]  Lidocaine (LIDODERM EX) Apply topically. Apply to right shoulder topically every 12 hours for pain. On in AM and off in PM    [provider]  Linaclotide (LINZESS) 290 MCG CAPS capsule Take  1 capsule (290 mcg total) by mouth daily. 07/22/14   Gerlene Fee, NP  losartan (COZAAR) 25 MG tablet Take 25 mg by mouth daily.    [provider]  methocarbamol (ROBAXIN) 750 MG tablet Take 750 mg by mouth 2 (two) times daily.     [provider]  metoCLOPramide (REGLAN) 5 MG tablet Take 5 mg by mouth 3 (three) times daily.    [provider]  Multiple Vitamins-Minerals (DECUBI-VITE) CAPS Take 1 capsule by mouth daily.    [provider]  omeprazole (PRILOSEC) 20 MG capsule Take 20 mg by mouth daily.    [provider]  Prenatal Multivit-Min-Fe-FA (PRENATAL 1 + IRON PO) Take 1 tablet by mouth daily.    [provider]  promethazine (PHENERGAN) 25 MG tablet Take 25 mg by mouth every 6 (six) hours as needed for nausea or vomiting.    [provider]  Salicylic Acid (NEUTROGENA OIL-FREE [facility]  EX) Apply 1 application topically every 8 (eight) hours as needed (for acne on face).     [provider]  sennosides-docusate sodium (SENOKOT-S) 8.6-50 MG tablet Take 2 tablets by mouth at bedtime.    [provider]  tiZANidine (ZANAFLEX) 2 MG tablet Take by mouth. Give 2 mg one time a day, give 6 mg at bedtime    [provider]  traZODone (DESYREL) 50 MG tablet Take 50 mg by mouth at bedtime.     [provider]    Family History Family History  Problem Relation Age of Onset  . Stroke Mother   . Hypertension Mother   . Cancer Maternal Aunt        unsure of what kind  . Hypertension Maternal Aunt   . Hypertension Maternal Uncle     Social History Social History   Tobacco Use  . Smoking status: Former Smoker    Packs/day: 1.00    Years: 10.00    Pack years: 10.00    Last attempt to quit: 07/28/1995    Years since quitting: 21.9  . Smokeless tobacco: Never Used  Substance Use Topics  . Alcohol use: No  . Drug use: No     Allergies   Patient has no known allergies.   Review of  Systems Review of Systems  Constitutional: Negative for chills and fever.  Respiratory: Positive for cough, chest tightness and shortness of breath.   Cardiovascular: Negative for chest pain, palpitations and leg swelling.  Gastrointestinal: Negative for abdominal pain, diarrhea, nausea and vomiting.  Genitourinary: Negative for dysuria, flank pain and pelvic pain.  Musculoskeletal: Negative for arthralgias, myalgias, neck pain and neck stiffness.  Skin: Negative for rash.  Neurological: Negative for dizziness, weakness and headaches.  All other systems reviewed and are negative.    Physical Exam Updated Vital Signs BP (!) 133/100   Pulse 88   Temp 98 F (36.7 C)   Resp 18   Ht 5\' 5"  (1.651 m)   Wt 70.8 kg (156 lb)   SpO2 98%   BMI 25.96 kg/m   Physical Exam  Constitutional: She is oriented to person, place, and time. She appears well-developed and well-nourished. No distress.  HENT:  Head: Normocephalic.  Eyes: Conjunctivae are normal.  Neck: Neck supple.  Cardiovascular: Normal rate, regular rhythm and normal heart sounds.  Pulmonary/Chest: Effort normal and breath sounds normal. No respiratory distress. She has no wheezes. She has no rales.  Decreased lung sounds on the right.  Rales at the left base  Abdominal: Soft. Bowel sounds are normal. She exhibits no distension. There is no tenderness. There is no rebound.  Musculoskeletal: She exhibits no edema.  Neurological: She is alert and oriented to person, place, and time.  Skin: Skin is warm and dry.  Psychiatric: She has a normal mood and affect. Her behavior is normal.  Nursing note and vitals reviewed.    ED Treatments / Results  Labs (all labs ordered are listed, but only abnormal results are displayed) Labs Reviewed  CBC WITH DIFFERENTIAL/PLATELET - Abnormal; Notable for the following components:      Result Value   RBC 5.63 (*)    MCH 25.4 (*)    RDW 16.3 (*)    All other components within normal limits    COMPREHENSIVE METABOLIC PANEL - Abnormal; Notable for the following components:   Albumin 3.3 (*)    All other components within normal limits  CULTURE, BLOOD (ROUTINE X 2)  CULTURE,  BLOOD (ROUTINE X 2)  I-STAT CG4 LACTIC ACID, ED    EKG  EKG Interpretation None       Radiology Dg Chest 2 View  Result Date: 06/28/2017 CLINICAL DATA:  Pt has been being treated for pneumonia. Changing her antibiotics haven't worked yet. Very SOB at times. Pt takes HTN meds. Not diabetic. Smoker from age [age]. Pt hasn't been able to get any better since getting sick a few weeks ago. EXAM: CHEST  2 VIEW COMPARISON:  CT chest 06/10/2016 FINDINGS: Right jugular central venous catheter with the tip projecting over the cavoatrial junction. Right lower lobe airspace disease with a lucent area which may reflect an area of cavitation. Small right pleural effusion. Left lung is clear. No pneumothorax. Stable cardiomediastinal silhouette. No acute osseous abnormality. IMPRESSION: 1. Right lower lobe airspace disease with a lucent area which may reflect cavitary pneumonia. Small right pleural effusion. Electronically Signed   By: Kathreen Devoid   On: 06/28/2017 19:52   Ct Chest W Contrast  Result Date: 06/28/2017 CLINICAL DATA:  Chronic pneumonia. Shortness of breath. History of breast cancer. EXAM: CT CHEST WITH CONTRAST TECHNIQUE: Multidetector CT imaging of the chest was performed during intravenous contrast administration. CONTRAST:  77mL ISOVUE-300 IOPAMIDOL (ISOVUE-300) INJECTION 61% COMPARISON:  Chest radiograph 06/28/2017, body CT 06/10/2016 FINDINGS: Cardiovascular: Mildly enlarged heart. Calcific atherosclerotic disease of the coronary arteries. No evidence of central pulmonary embolus. Normal caliber of the aorta. Mediastinum/Nodes: 2 borderline pathologic by CT criteria but rounded right pretracheal lymph nodes, the larger measuring 10 mm in short axis. Normal appearance of the trachea. Large hiatal hernia.  Lungs/Pleura: There is a large right pleural effusion. There is a near complete collapse of the right middle lobe and complete collapse of the right lower lobe. There is a small left pleural effusion with minimal atelectasis in the left lung base. Upper Abdomen: No acute abnormality. Musculoskeletal: Posttraumatic versus congenital deformity of T8 vertebral body with bony fusion of the right posterior seventh and eighth ribs and left eighth costovertebral junction. Stable associated kyphotic deformity. IMPRESSION: Large right pleural effusion. Near complete collapse of the right middle lobe and complete collapse of the right lower lobe. No endobronchial lesions are seen to account for that. Re-evaluation after resolution of the pleural effusion may be considered. Two borderline abnormal right pretracheal lymph nodes, both could be reactive or malignant. Small left pleural effusion with sub segmental atelectasis in the left lung base. Large hiatal hernia. Electronically Signed   By: Fidela Salisbury M.D.   On: 06/28/2017 23:14    Procedures Procedures (including critical care time)  Medications Ordered in ED Medications - No data to display   Initial Impression / Assessment and Plan / ED Course  I have reviewed the triage vital signs and the nursing notes.  Pertinent labs & imaging results that were available during my care of the patient were reviewed by me and considered in my medical decision making (see chart for details).     Patient with worsening pneumonia in the right lower lobe over the last month and a half.  She is afebrile here, vital signs are normal.  I discussed patient with her nurse at the nursing home, patient has been on multiple different antibiotics since then.  Will do labs, CT chest, monitor.   11:47 PM Patient CT scan shows large pleural effusion on the right with nearly collapsed right middle lung and collapsed right lower lung.  No definitive masses or lesions seen.   2  pretracheal abnormal lymph nodes were also seen.  I discussed results with the patient, the fact that she will need further studies and drainage of the fluid and possible reimaging after.  The patient will be admitted to medicine for further evaluation and treatment.  At this time her vital signs remaining stable, blood pressure elevated, otherwise she is not hypoxic, not tachypneic or tachycardic.   Vitals:   06/28/17 1844 06/28/17 1900 06/28/17 2000 06/28/17 2057  BP: (!) 132/100 (!) 137/97 (!) 133/100 (!) 153/104  Pulse: 89 89 88 97  Resp: 18   18  Temp: 98 F (36.7 C)     SpO2: 97% 99% 98% 98%  Weight:      Height:         Final Clinical Impressions(s) / ED Diagnoses   Final diagnoses:  Pleural effusion  SOB (shortness of breath)    ED Discharge Orders    None       Jeannett Senior, PA-C 06/29/17 0014    Jeannett Senior, PA-C 06/29/17 0016    Tegeler, Gwenyth Allegra, MD 06/29/17 1245

## 2017-06-28 NOTE — ED Notes (Signed)
CT dept called notified of IV placement

## 2017-06-28 NOTE — ED Notes (Signed)
Unable to collect labs nurse is going to access the labs

## 2017-06-28 NOTE — ED Triage Notes (Signed)
Pt arrived from [facility] via [place] with chronic pneumonia for which she is on antibiotics regularly. She was placed on a different antibiotic on November 26th. They did a repeat chest x-ray today, and it showed that her pneumonia has gotten worse since being on the new antibiotic. Patient denies chest pain and reports "a little shortness of breath" at the present time.

## 2017-06-28 NOTE — H&P (Signed)
History and Physical    [name] BHA:[id number] DOB: Apr 07, 1951 DOA: 06/28/2017  Referring MD/NP/PA: Jeannett Senior, PA-C PCP: Patient, No Pcp Per  Patient coming from: SNF via EMS  Chief Complaint: Cough  I have personally briefly reviewed patient's old medical records in [place]   HPI: [name] is a 66 y.o. female with medical history significant of diastolic CHF, paraplegia wheelchair-bound, breast cancer, and rheumatoid arthritis; who presented with complaints of worsening pneumonia.  History is obtained from the patient and review of records.  It seems she was initially diagnosed with pneumonia on 10/18, and had been treated with multiple multiple rounds of antibiotics including Augmentin from 10/19-10/26, clindamycin and Levaquin 11/9 through 11/14, and currently has been on vancomycin and Invanz and clindamycin since 11/28.  Despite antibiotics she states there is been no change in her symptoms.  Noted to have progressively worsening pneumonia on serial chest x-rays.  Patient complains of a continued dry cough that has relatively remained unchanged.  She previously was not on oxygen until about 2 weeks ago.   ED Course: Upon admission into the emergency department she was noted to be afebrile with blood pressure 132/100- 153/104, O2 saturation 97-99% with on 2 L of nasal cannula oxygen, and all other vital signs within normal limits.  Labs revealed WBC 7, hemoglobin 14.3, albumin 3.3, lactic acid 0.99, and all other labs relatively within normal limits.  Chest x-ray showing a right lower lobe disease which may reflect cavitary pneumonia and a small right-sided pleural effusion.  Given patient's history of chronic pneumonia a CT scan was obtained and revealed a large right-sided pleural effusion with n collapse of the right middle and lower lobe.  Review of Systems  Constitutional: Negative for chills, fever and weight loss.  HENT: Negative for ear discharge  and nosebleeds.   Eyes: Negative for double vision and photophobia.  Respiratory: Positive for cough and shortness of breath. Negative for sputum production.   Cardiovascular: Negative for chest pain and leg swelling.  Gastrointestinal: Negative for abdominal pain, nausea and vomiting.  Genitourinary: Negative for dysuria and hematuria.  Musculoskeletal: Positive for myalgias. Negative for falls.  Skin: Negative for itching and rash.  Neurological: Negative for speech change and seizures.  Psychiatric/Behavioral: Negative for hallucinations and substance abuse.    Past Medical History:  Diagnosis Date  . Anemia 10/12/2008  . CHF (congestive heart failure) ([place])    last Echo 2016?  Marland Kitchen Diaphragmatic hernia without mention of obstruction or gangrene 08/29/2010  . Edema 05/20/2009  . Gastroparesis 10/05/2010  . GERD (gastroesophageal reflux disease) 02/02/2009  . Hypertension 10/12/2008  . Hypotension, unspecified 05/20/2009  . Immobility syndrome (paraplegic) 1977  . Iron deficiency anemia, unspecified 10/12/2008  . Leiomyoma of uterus   . Malignant neoplasm of breast (female), unspecified site 10/05/2010   2003, 2013   . Rheumatoid arthritis ([place]) 01/17/2009    Past Surgical History:  Procedure Laterality Date  . [place]  . BREAST SURGERY  2003   Bilateral mastectomy  . IR FLUORO GUIDE CV LINE RIGHT  06/23/2017  . IR US GUIDE VASC ACCESS RIGHT  06/23/2017  . PRESSURE ULCER DEBRIDEMENT  2009   on back  . WOUND DEBRIDEMENT  09/02/2008   Large sacral back open wound     reports that she quit smoking about 21 years ago. She has a 10.00 pack-year smoking history. she has never used smokeless tobacco. She reports that  she does not drink alcohol or use drugs.  No Known Allergies  Family History  Problem Relation Age of Onset  . Stroke Mother   . Hypertension Mother   . Cancer Maternal Aunt        unsure of what kind  . Hypertension Maternal Aunt    . Hypertension Maternal Uncle     Prior to Admission medications   Medication Sig Start Date End Date Taking? Authorizing Provider  acetaminophen (TYLENOL) 325 MG tablet Take 650 mg by mouth every 6 (six) hours as needed.   Yes [provider]  Amino Acids-Protein Hydrolys (FEEDING SUPPLEMENT, PRO-STAT SUGAR FREE 64,) LIQD Take 30 mLs by mouth. 30 cc BID PO QD   Yes [provider]  baclofen (LIORESAL) 10 MG tablet Take 10 mg by mouth 3 (three) times daily as needed.   Yes [provider]  bisacodyl (DULCOLAX) 10 MG suppository Place 10 mg rectally every other day.    Yes [provider]  calcium carbonate (TUMS - DOSED IN MG ELEMENTAL CALCIUM) 500 MG chewable tablet Chew 2 tablets by mouth every 8 (eight) hours as needed for indigestion or heartburn.   Yes [provider]  calcium-vitamin D (OYSTER CALCIUM 500 + D) 500-200 MG-UNIT per tablet Take 1 tablet by mouth 2 (two) times daily.    Yes [provider]  carvedilol (COREG) 6.25 MG tablet Take 6.25 mg by mouth daily. Hold if systolic BP is less than 962   Yes [provider]  cetirizine (ZYRTEC ALLERGY) 10 MG tablet Take 10 mg by mouth daily as needed for allergies.    Yes [provider]  cholecalciferol (VITAMIN D) 1000 units tablet Take 1,000 Units by mouth daily.   Yes [provider]  Cranberry 425 MG CAPS Take 425 mg by mouth daily.   Yes [provider]  cyclobenzaprine (FLEXERIL) 10 MG tablet Take 10 mg by mouth daily as needed for muscle spasms.   Yes [provider]  diclofenac sodium (VOLTAREN) 1 % GEL Apply 3 g topically 4 (four) times daily.   Yes [provider]  diphenhydrAMINE (BENADRYL) 25 MG tablet Take 25 mg by mouth every 8 (eight) hours as needed for itching.   Yes [provider]  escitalopram (LEXAPRO) 5 MG tablet Take 15 mg by mouth daily.   Yes [provider]  fluticasone (FLONASE) 50 MCG/ACT  nasal spray Place 2 sprays into both nostrils daily as needed for allergies.    Yes [provider]  furosemide (LASIX) 20 MG tablet Take 20 mg by mouth daily.   Yes [provider]  HYDROcodone-acetaminophen (NORCO) 7.5-325 MG tablet Take 1 tablet by mouth every 6 (six) hours as needed for moderate pain.   Yes [provider]  hydrocortisone 2.5 % cream Apply 1 application topically 2 (two) times daily. To rash   Yes [provider]  hydroxychloroquine (PLAQUENIL) 200 MG tablet Take 100 mg by mouth daily.    Yes [provider]  Lidocaine (LIDODERM EX) Apply topically. Apply to right shoulder topically every 12 hours for pain. On in AM and off in PM   Yes [provider]  Linaclotide (LINZESS) 290 MCG CAPS capsule Take 1 capsule (290 mcg total) by mouth daily. 07/22/14  Yes Gerlene Fee, NP  losartan (COZAAR) 25 MG tablet Take 25 mg by mouth daily.   Yes [provider]  methocarbamol (ROBAXIN) 750 MG tablet Take 750 mg by mouth 2 (two)  times daily.    Yes [provider]  metoCLOPramide (REGLAN) 5 MG tablet Take 5 mg by mouth 3 (three) times daily.   Yes [provider]  Multiple Vitamins-Minerals (DECUBI-VITE) CAPS Take 1 capsule by mouth daily.   Yes [provider]  nystatin (MYCOSTATIN) 100000 UNIT/ML suspension Take 5 mLs by mouth 4 (four) times daily.   Yes [provider]  omeprazole (PRILOSEC) 20 MG capsule Take 20 mg by mouth daily.   Yes [provider]  oxybutynin (DITROPAN) 5 MG tablet Take 5 mg by mouth 2 (two) times daily.   Yes [provider]  Prenatal Multivit-Min-Fe-FA (PRENATAL 1 + IRON PO) Take 1 tablet by mouth daily.   Yes [provider]  promethazine (PHENERGAN) 25 MG tablet Take 25 mg by mouth every 6 (six) hours as needed for nausea or vomiting.   Yes [provider]  Salicylic Acid (NEUTROGENA OIL-FREE ACNE [place] EX) Apply 1 application  topically every 8 (eight) hours as needed (for acne on face).    Yes [provider]  sennosides-docusate sodium (SENOKOT-S) 8.6-50 MG tablet Take 2 tablets by mouth at bedtime.   Yes [provider]    Physical Exam:  Constitutional: Elderly female in no acute distress Vitals:   06/28/17 1844 06/28/17 1900 06/28/17 2000 06/28/17 2057  BP: (!) 132/100 (!) 137/97 (!) 133/100 (!) 153/104  Pulse: 89 89 88 97  Resp: 18   18  Temp: 98 F (36.7 C)     SpO2: 97% 99% 98% 98%  Weight:      Height:       Eyes: PERRL, lids and conjunctivae normal ENMT: Mucous membranes are moist. Posterior pharynx clear of any exudate or lesions. Neck: normal, supple, no masses, no thyromegaly Respiratory: Decreased overall breath sounds noted on the right lung field.  Patient able to talk in complete sentences and currently on 2 L of nasal cannula oxygen. Cardiovascular: Regular rate and rhythm, no murmurs / rubs / gallops. No extremity edema. 2+ pedal pulses. No carotid bruits.  Abdomen: no tenderness, no masses palpated. No hepatosplenomegaly. Bowel sounds positive.  Musculoskeletal: no clubbing / cyanosis.  Contractures noted of the lower extremities left worse than the right. Skin: no rashes, lesions, ulcers. No induration Neurologic: CN 2-12 grossly intact.  Paraplegic. Psychiatric: Normal judgment and insight. Alert and oriented x 3. Normal mood.     Labs on Admission: I have personally reviewed following labs and imaging studies  CBC: Recent Labs  Lab 06/28/17 2039  WBC 7.0  NEUTROABS 3.8  HGB 14.3  HCT 44.7  MCV 79.4  PLT 299   Basic Metabolic Panel: Recent Labs  Lab 06/28/17 2039  NA 143  K 3.5  CL 107  CO2 26  GLUCOSE 89  BUN 10  CREATININE 0.49  CALCIUM 9.2   GFR: Estimated Creatinine Clearance: 68.3 mL/min (by C-G formula based on SCr of 0.49 mg/dL). Liver Function Tests: Recent Labs  Lab 06/28/17 2039  AST 31  ALT 42  ALKPHOS 63  BILITOT 0.5    PROT 7.6  ALBUMIN 3.3*   No results for input(s): LIPASE, AMYLASE in the last 168 hours. No results for input(s): AMMONIA in the last 168 hours. Coagulation Profile: No results for input(s): INR, PROTIME in the last 168 hours. Cardiac Enzymes: No results for input(s): CKTOTAL, CKMB, CKMBINDEX, TROPONINI in the last 168 hours. BNP (last 3 results) No results for input(s): PROBNP in the last 8760 hours. HbA1C: No results  for input(s): HGBA1C in the last 72 hours. CBG: No results for input(s): GLUCAP in the last 168 hours. Lipid Profile: No results for input(s): CHOL, HDL, LDLCALC, TRIG, CHOLHDL, LDLDIRECT in the last 72 hours. Thyroid Function Tests: No results for input(s): TSH, T4TOTAL, FREET4, T3FREE, THYROIDAB in the last 72 hours. Anemia Panel: No results for input(s): VITAMINB12, FOLATE, FERRITIN, TIBC, IRON, RETICCTPCT in the last 72 hours. Urine analysis:    Component Value Date/Time   COLORURINE YELLOW 06/10/2016 2037   APPEARANCEUR CLOUDY (A) 06/10/2016 2037   LABSPEC 1.023 06/10/2016 2037   PHURINE 5.5 06/10/2016 2037   GLUCOSEU NEGATIVE 06/10/2016 2037   HGBUR NEGATIVE 06/10/2016 2037   [place] NEGATIVE 06/10/2016 2037   KETONESUR NEGATIVE 06/10/2016 2037   PROTEINUR NEGATIVE 06/10/2016 2037   UROBILINOGEN 1.0 09/30/2010 1854   NITRITE POSITIVE (A) 06/10/2016 2037   LEUKOCYTESUR LARGE (A) 06/10/2016 2037   Sepsis Labs: No results found for this or any previous visit (from the past 240 hour(s)).   Radiological Exams on Admission: Dg Chest 2 View  Result Date: 06/28/2017 CLINICAL DATA:  Pt has been being treated for pneumonia. Changing her antibiotics haven't worked yet. Very SOB at times. Pt takes HTN meds. Not diabetic. Smoker from age [age]. Pt hasn't been able to get any better since getting sick a few weeks ago. EXAM: CHEST  2 VIEW COMPARISON:  CT chest 06/10/2016 FINDINGS: Right jugular central venous catheter with the tip projecting over the cavoatrial  junction. Right lower lobe airspace disease with a lucent area which may reflect an area of cavitation. Small right pleural effusion. Left lung is clear. No pneumothorax. Stable cardiomediastinal silhouette. No acute osseous abnormality. IMPRESSION: 1. Right lower lobe airspace disease with a lucent area which may reflect cavitary pneumonia. Small right pleural effusion. Electronically Signed   By: Kathreen Devoid   On: 06/28/2017 19:52   Ct Chest W Contrast  Result Date: 06/28/2017 CLINICAL DATA:  Chronic pneumonia. Shortness of breath. History of breast cancer. EXAM: CT CHEST WITH CONTRAST TECHNIQUE: Multidetector CT imaging of the chest was performed during intravenous contrast administration. CONTRAST:  71mL ISOVUE-300 IOPAMIDOL (ISOVUE-300) INJECTION 61% COMPARISON:  Chest radiograph 06/28/2017, body CT 06/10/2016 FINDINGS: Cardiovascular: Mildly enlarged heart. Calcific atherosclerotic disease of the coronary arteries. No evidence of central pulmonary embolus. Normal caliber of the aorta. Mediastinum/Nodes: 2 borderline pathologic by CT criteria but rounded right pretracheal lymph nodes, the larger measuring 10 mm in short axis. Normal appearance of the trachea. Large hiatal hernia. Lungs/Pleura: There is a large right pleural effusion. There is a near complete collapse of the right middle lobe and complete collapse of the right lower lobe. There is a small left pleural effusion with minimal atelectasis in the left lung base. Upper Abdomen: No acute abnormality. Musculoskeletal: Posttraumatic versus congenital deformity of T8 vertebral body with bony fusion of the right posterior seventh and eighth ribs and left eighth costovertebral junction. Stable associated kyphotic deformity. IMPRESSION: Large right pleural effusion. Near complete collapse of the right middle lobe and complete collapse of the right lower lobe. No endobronchial lesions are seen to account for that. Re-evaluation after resolution of the  pleural effusion may be considered. Two borderline abnormal right pretracheal lymph nodes, both could be reactive or malignant. Small left pleural effusion with sub segmental atelectasis in the left lung base. Large hiatal hernia. Electronically Signed   By: Fidela Salisbury M.D.   On: 06/28/2017 23:14    EKG: Independently reviewed.  Sinus rhythm with LVH.  Assessment/Plan Right pleural effusion: Acute.  Patient found to have a large right-sided pleural effusion.  Suspect symptoms secondary to patient's CHF. - Admit to MedSurg bed - NPO - IR consult for therapeutic/diagnostic thoracentesis in a.m.  Diastolic CHF: Acute on chronic. last EF noted to be 60-65% with grade 1 diastolic dysfunction in 11/6312.  Patient's BNP noted to be elevated at 704.9. - Strict intake and output and daily weights - Lasix 40 mg IV Bid - Check echocardiogram - Message sent for cardiology to eval in a.m.  Respiratory failure with hypoxia: Acute.  Patient notes that she is only been on oxygen for the last 2 weeks likely secondary to large right pleural effusion noted on CT scan of the chest. - Continuous pulse oximetry with nasal cannula oxygen to keep O2 saturation greater than 92%  Essential hypertension - Continue Coreg and losartan  Rheumatoid arthritis - Continue Plaquenil  Paraplegia - Continue baclofen  DVT prophylaxis: Lovenox Code Status: Full Family Communication: none present  Disposition Plan: SNF  Consults called: none  Admission status:Inpatient   Norval Morton MD [facility] Pager [phone]   If 7PM-7AM, please contact night-coverage [email or web address] Password [email or web address]  06/28/2017, 11:44 PM

## 2017-06-29 ENCOUNTER — Observation Stay (HOSPITAL_BASED_OUTPATIENT_CLINIC_OR_DEPARTMENT_OTHER): Payer: Medicare Other

## 2017-06-29 ENCOUNTER — Observation Stay (HOSPITAL_COMMUNITY): Payer: Medicare Other

## 2017-06-29 ENCOUNTER — Other Ambulatory Visit: Payer: Self-pay

## 2017-06-29 DIAGNOSIS — D509 Iron deficiency anemia, unspecified: Secondary | ICD-10-CM | POA: Diagnosis present

## 2017-06-29 DIAGNOSIS — I5021 Acute systolic (congestive) heart failure: Secondary | ICD-10-CM | POA: Diagnosis not present

## 2017-06-29 DIAGNOSIS — M24551 Contracture, right hip: Secondary | ICD-10-CM | POA: Diagnosis present

## 2017-06-29 DIAGNOSIS — Z9981 Dependence on supplemental oxygen: Secondary | ICD-10-CM | POA: Diagnosis not present

## 2017-06-29 DIAGNOSIS — J9621 Acute and chronic respiratory failure with hypoxia: Secondary | ICD-10-CM | POA: Diagnosis present

## 2017-06-29 DIAGNOSIS — M24552 Contracture, left hip: Secondary | ICD-10-CM | POA: Diagnosis present

## 2017-06-29 DIAGNOSIS — I10 Essential (primary) hypertension: Secondary | ICD-10-CM

## 2017-06-29 DIAGNOSIS — M069 Rheumatoid arthritis, unspecified: Secondary | ICD-10-CM

## 2017-06-29 DIAGNOSIS — I110 Hypertensive heart disease with heart failure: Secondary | ICD-10-CM | POA: Diagnosis present

## 2017-06-29 DIAGNOSIS — I5041 Acute combined systolic (congestive) and diastolic (congestive) heart failure: Secondary | ICD-10-CM | POA: Diagnosis not present

## 2017-06-29 DIAGNOSIS — Z7951 Long term (current) use of inhaled steroids: Secondary | ICD-10-CM | POA: Diagnosis not present

## 2017-06-29 DIAGNOSIS — I509 Heart failure, unspecified: Secondary | ICD-10-CM | POA: Diagnosis not present

## 2017-06-29 DIAGNOSIS — Z9889 Other specified postprocedural states: Secondary | ICD-10-CM

## 2017-06-29 DIAGNOSIS — Z9221 Personal history of antineoplastic chemotherapy: Secondary | ICD-10-CM | POA: Diagnosis not present

## 2017-06-29 DIAGNOSIS — M24562 Contracture, left knee: Secondary | ICD-10-CM | POA: Diagnosis present

## 2017-06-29 DIAGNOSIS — K449 Diaphragmatic hernia without obstruction or gangrene: Secondary | ICD-10-CM | POA: Diagnosis present

## 2017-06-29 DIAGNOSIS — I5033 Acute on chronic diastolic (congestive) heart failure: Secondary | ICD-10-CM

## 2017-06-29 DIAGNOSIS — R0602 Shortness of breath: Secondary | ICD-10-CM | POA: Diagnosis present

## 2017-06-29 DIAGNOSIS — J9691 Respiratory failure, unspecified with hypoxia: Secondary | ICD-10-CM | POA: Diagnosis present

## 2017-06-29 DIAGNOSIS — J90 Pleural effusion, not elsewhere classified: Secondary | ICD-10-CM

## 2017-06-29 DIAGNOSIS — L8990 Pressure ulcer of unspecified site, unspecified stage: Secondary | ICD-10-CM

## 2017-06-29 DIAGNOSIS — I5043 Acute on chronic combined systolic (congestive) and diastolic (congestive) heart failure: Secondary | ICD-10-CM | POA: Diagnosis present

## 2017-06-29 DIAGNOSIS — E876 Hypokalemia: Secondary | ICD-10-CM | POA: Diagnosis present

## 2017-06-29 DIAGNOSIS — J9601 Acute respiratory failure with hypoxia: Secondary | ICD-10-CM | POA: Diagnosis not present

## 2017-06-29 DIAGNOSIS — Z9013 Acquired absence of bilateral breasts and nipples: Secondary | ICD-10-CM | POA: Diagnosis not present

## 2017-06-29 DIAGNOSIS — Z79899 Other long term (current) drug therapy: Secondary | ICD-10-CM | POA: Diagnosis not present

## 2017-06-29 DIAGNOSIS — Z87891 Personal history of nicotine dependence: Secondary | ICD-10-CM | POA: Diagnosis not present

## 2017-06-29 DIAGNOSIS — Z8701 Personal history of pneumonia (recurrent): Secondary | ICD-10-CM | POA: Diagnosis not present

## 2017-06-29 DIAGNOSIS — L89152 Pressure ulcer of sacral region, stage 2: Secondary | ICD-10-CM | POA: Diagnosis present

## 2017-06-29 DIAGNOSIS — Z853 Personal history of malignant neoplasm of breast: Secondary | ICD-10-CM | POA: Diagnosis not present

## 2017-06-29 DIAGNOSIS — G8220 Paraplegia, unspecified: Secondary | ICD-10-CM | POA: Diagnosis present

## 2017-06-29 DIAGNOSIS — K3184 Gastroparesis: Secondary | ICD-10-CM | POA: Diagnosis present

## 2017-06-29 DIAGNOSIS — K219 Gastro-esophageal reflux disease without esophagitis: Secondary | ICD-10-CM | POA: Diagnosis present

## 2017-06-29 DIAGNOSIS — Z993 Dependence on wheelchair: Secondary | ICD-10-CM | POA: Diagnosis not present

## 2017-06-29 LAB — BODY FLUID CELL COUNT WITH DIFFERENTIAL
EOS FL: 1 %
Lymphs, Fluid: 52 %
Monocyte-Macrophage-Serous Fluid: 19 % — ABNORMAL LOW (ref 50–90)
NEUTROPHIL FLUID: 28 % — AB (ref 0–25)
Total Nucleated Cell Count, Fluid: 449 cu mm (ref 0–1000)

## 2017-06-29 LAB — ECHOCARDIOGRAM COMPLETE
Height: 65 in
WEIGHTICAEL: 2496 [oz_av]

## 2017-06-29 LAB — GRAM STAIN

## 2017-06-29 LAB — GLUCOSE, PLEURAL OR PERITONEAL FLUID: Glucose, Fluid: 94 mg/dL

## 2017-06-29 LAB — MRSA PCR SCREENING: MRSA BY PCR: NEGATIVE

## 2017-06-29 LAB — PROTEIN, PLEURAL OR PERITONEAL FLUID: TOTAL PROTEIN, FLUID: 3.4 g/dL

## 2017-06-29 LAB — TROPONIN I
TROPONIN I: 0.03 ng/mL — AB (ref <0.03)
TROPONIN I: 0.04 ng/mL — AB (ref <0.03)

## 2017-06-29 LAB — ALBUMIN, PLEURAL OR PERITONEAL FLUID: Albumin, Fluid: 1.8 g/dL

## 2017-06-29 LAB — BRAIN NATRIURETIC PEPTIDE: B NATRIURETIC PEPTIDE 5: 704.9 pg/mL — AB (ref 0.0–100.0)

## 2017-06-29 LAB — LACTATE DEHYDROGENASE, PLEURAL OR PERITONEAL FLUID: LD FL: 70 U/L — AB (ref 3–23)

## 2017-06-29 MED ORDER — BACLOFEN 10 MG PO TABS
10.0000 mg | ORAL_TABLET | Freq: Three times a day (TID) | ORAL | Status: DC | PRN
  Administered 2017-06-30: 10 mg via ORAL
  Filled 2017-06-29 (×2): qty 1

## 2017-06-29 MED ORDER — BISACODYL 10 MG RE SUPP
10.0000 mg | RECTAL | Status: DC
  Administered 2017-06-29 – 2017-07-03 (×3): 10 mg via RECTAL
  Filled 2017-06-29 (×4): qty 1

## 2017-06-29 MED ORDER — FLUTICASONE PROPIONATE 50 MCG/ACT NA SUSP
2.0000 | Freq: Every day | NASAL | Status: DC | PRN
  Filled 2017-06-29: qty 16

## 2017-06-29 MED ORDER — METOCLOPRAMIDE HCL 5 MG PO TABS
5.0000 mg | ORAL_TABLET | Freq: Three times a day (TID) | ORAL | Status: DC
  Administered 2017-06-29 – 2017-07-03 (×11): 5 mg via ORAL
  Filled 2017-06-29 (×11): qty 1

## 2017-06-29 MED ORDER — LOSARTAN POTASSIUM 25 MG PO TABS
25.0000 mg | ORAL_TABLET | Freq: Every day | ORAL | Status: DC
  Administered 2017-06-29 – 2017-07-03 (×4): 25 mg via ORAL
  Filled 2017-06-29 (×5): qty 1

## 2017-06-29 MED ORDER — HYDROXYCHLOROQUINE SULFATE 200 MG PO TABS
100.0000 mg | ORAL_TABLET | Freq: Every day | ORAL | Status: DC
  Administered 2017-06-29 – 2017-07-03 (×4): 100 mg via ORAL
  Filled 2017-06-29 (×5): qty 0.5

## 2017-06-29 MED ORDER — LIDOCAINE HCL 2 % IJ SOLN
INTRAMUSCULAR | Status: AC
  Filled 2017-06-29: qty 10

## 2017-06-29 MED ORDER — METHOCARBAMOL 500 MG PO TABS
750.0000 mg | ORAL_TABLET | Freq: Two times a day (BID) | ORAL | Status: DC
  Administered 2017-06-29 – 2017-07-03 (×8): 750 mg via ORAL
  Filled 2017-06-29 (×8): qty 2

## 2017-06-29 MED ORDER — CARVEDILOL 6.25 MG PO TABS
6.2500 mg | ORAL_TABLET | Freq: Every day | ORAL | Status: DC
  Administered 2017-06-29 – 2017-07-01 (×3): 6.25 mg via ORAL
  Filled 2017-06-29 (×4): qty 1

## 2017-06-29 MED ORDER — ACETAMINOPHEN 325 MG PO TABS
650.0000 mg | ORAL_TABLET | Freq: Four times a day (QID) | ORAL | Status: DC | PRN
  Filled 2017-06-29 (×2): qty 2

## 2017-06-29 MED ORDER — SENNOSIDES-DOCUSATE SODIUM 8.6-50 MG PO TABS
2.0000 | ORAL_TABLET | Freq: Every day | ORAL | Status: DC
  Administered 2017-06-29 – 2017-07-02 (×4): 2 via ORAL
  Filled 2017-06-29 (×4): qty 2

## 2017-06-29 MED ORDER — FUROSEMIDE 10 MG/ML IJ SOLN
40.0000 mg | Freq: Two times a day (BID) | INTRAMUSCULAR | Status: DC
  Administered 2017-06-29 – 2017-07-01 (×5): 40 mg via INTRAVENOUS
  Filled 2017-06-29 (×5): qty 4

## 2017-06-29 MED ORDER — LIDOCAINE 5 % EX PTCH
1.0000 | MEDICATED_PATCH | CUTANEOUS | Status: DC
  Administered 2017-06-30: 1 via TRANSDERMAL
  Filled 2017-06-29 (×4): qty 1

## 2017-06-29 MED ORDER — HYDROCODONE-ACETAMINOPHEN 7.5-325 MG PO TABS
1.0000 | ORAL_TABLET | Freq: Four times a day (QID) | ORAL | Status: DC | PRN
  Administered 2017-06-29 – 2017-07-02 (×3): 1 via ORAL
  Filled 2017-06-29 (×4): qty 1

## 2017-06-29 MED ORDER — ESCITALOPRAM OXALATE 10 MG PO TABS
15.0000 mg | ORAL_TABLET | Freq: Every day | ORAL | Status: DC
  Administered 2017-06-29 – 2017-07-03 (×4): 15 mg via ORAL
  Filled 2017-06-29 (×2): qty 1
  Filled 2017-06-29: qty 2
  Filled 2017-06-29: qty 1

## 2017-06-29 MED ORDER — PANTOPRAZOLE SODIUM 40 MG PO TBEC
40.0000 mg | DELAYED_RELEASE_TABLET | Freq: Every day | ORAL | Status: DC
  Administered 2017-06-29 – 2017-07-03 (×4): 40 mg via ORAL
  Filled 2017-06-29 (×4): qty 1

## 2017-06-29 MED ORDER — LINACLOTIDE 145 MCG PO CAPS
290.0000 ug | ORAL_CAPSULE | Freq: Every day | ORAL | Status: DC
  Administered 2017-06-29 – 2017-07-03 (×4): 290 ug via ORAL
  Filled 2017-06-29 (×5): qty 2

## 2017-06-29 MED ORDER — DIPHENHYDRAMINE HCL 25 MG PO CAPS: 25.0000 mg | ORAL_CAPSULE | Freq: Three times a day (TID) | ORAL | Status: DC | PRN

## 2017-06-29 MED ORDER — CALCIUM CARBONATE ANTACID 500 MG PO CHEW
1.0000 | CHEWABLE_TABLET | Freq: Three times a day (TID) | ORAL | Status: DC | PRN
  Administered 2017-06-29 – 2017-07-03 (×2): 200 mg via ORAL
  Filled 2017-06-29 (×2): qty 1

## 2017-06-29 MED ORDER — PRO-STAT SUGAR FREE PO LIQD
30.0000 mL | Freq: Two times a day (BID) | ORAL | Status: DC
  Administered 2017-06-29 – 2017-07-01 (×4): 30 mL via ORAL
  Filled 2017-06-29 (×7): qty 30

## 2017-06-29 MED ORDER — OXYBUTYNIN CHLORIDE 5 MG PO TABS
5.0000 mg | ORAL_TABLET | Freq: Two times a day (BID) | ORAL | Status: DC
  Administered 2017-06-29 – 2017-07-03 (×8): 5 mg via ORAL
  Filled 2017-06-29 (×9): qty 1

## 2017-06-29 MED ORDER — CYCLOBENZAPRINE HCL 10 MG PO TABS: 10.0000 mg | ORAL_TABLET | Freq: Every day | ORAL | Status: DC | PRN

## 2017-06-29 MED ORDER — ENOXAPARIN SODIUM 40 MG/0.4ML ~~LOC~~ SOLN
40.0000 mg | Freq: Every day | SUBCUTANEOUS | Status: DC
  Administered 2017-06-29 – 2017-07-01 (×3): 40 mg via SUBCUTANEOUS
  Filled 2017-06-29 (×4): qty 0.4

## 2017-06-29 MED ORDER — DICLOFENAC SODIUM 1 % TD GEL
4.0000 g | Freq: Four times a day (QID) | TRANSDERMAL | Status: DC
  Administered 2017-06-29 – 2017-07-03 (×5): 4 g via TOPICAL
  Filled 2017-06-29 (×2): qty 100

## 2017-06-29 MED ORDER — NYSTATIN 100000 UNIT/ML MT SUSP
5.0000 mL | Freq: Four times a day (QID) | OROMUCOSAL | Status: DC
  Administered 2017-06-29 – 2017-06-30 (×3): 500000 [IU] via ORAL
  Administered 2017-07-01: 5 mL via ORAL
  Administered 2017-07-03: 500000 [IU] via ORAL
  Filled 2017-06-29 (×16): qty 5

## 2017-06-29 NOTE — Procedures (Signed)
Ultrasound-guided diagnostic and therapeutic right thoracentesis performed yielding 1.2 liters of yellow  fluid. No immediate complications. Follow-up chest x-ray pending. The fluid was sent to the lab for preordered studies.

## 2017-06-29 NOTE — ED Notes (Signed)
Dr. Tamala [name] notified of troponin 0.03 repeat in 3 hrs. Per order

## 2017-06-29 NOTE — ED Notes (Signed)
Report given to Margarita Grizzle, Therapist, sports. Care transferred at this time. Pt transported to 1102 with RN.

## 2017-06-29 NOTE — Progress Notes (Addendum)
PROGRESS NOTE  [name] KZS:[id number] DOB: [birth year] DOA: 06/28/2017 PCP: Patient, No Pcp Per  HPI/Recap of past 24 hours: [name] is a 66 y.o. female with medical history significant of diastolic CHF, paraplegia wheelchair-bound, breast cancer, and rheumatoid arthritis; who presented with complaints of worsening cough.  Patient reports being treated for pna previously with multiple rounds of antibiotics before presentation without improvement of her symptoms. Reports chronic hypoxia on 2L O2 supplementation continuously.  Pt was seen and examined at her bedside. She reports persistent dry cough. Denies hemoptysis, recent traveling outside the country or state, tobacco exposure or sick contacts. Chest xray reviewed and revealed possible cavitary lesion affecting right lower lobe and right pleural effusion. Afebrile. No leukocytosis. Post right thoracentesis (06/29/17) with 1.2L yellow fluid removed.   Assessment/Plan: Active Problems:   Rheumatoid arthritis (HCC)   Essential hypertension   Paraplegia (HCC)   CHF (congestive heart failure) (HCC)   History of breast cancer   Pleural effusion, right   Respiratory failure with hypoxia (HCC)   Assessment/Plan Acute right pleural effusion: -large right-sided pleural effusion.   -Suspect secondary to CHF. -post right thoracentesis with 1.2L yellow fluid removed (73/2/20)  Diastolic CHF: Acute on chronic.  -last EF 60-65% with grade 1 diastolic dysfunction in 08/5425.  -BNP 704. -Strict intake and output, daily weights -Lasix 40 mg IV Bid -2D echocardiogram  Acute on chronic respiratory failure most likely 2/2 to right pleural effusion.  -on 2L continuously at baseline -Continuous pulse oximetry with nasal cannula oxygen to keep O2 saturation greater or equal to 92%  Essential hypertension -stable -Continue Coreg and losartan  Rheumatoid arthritis -stable -Continue  Plaquenil  Paraplegia -stable -Continue baclofen     Code Status: Full  Family Communication: No family members at bedside.  Disposition Plan: will stay another  Midnight to continue present management. Close monitoring post right thoracentesis.   Consultants:  cardiology  Procedures:  None  Antimicrobials:  none  DVT prophylaxis:  lovenox sq 40 mg daily   Objective: Vitals:   06/29/17 0035 06/29/17 0237 06/29/17 0438 06/29/17 0635  BP: 99/78 106/75 102/78 109/80  Pulse: 85 81 83 84  Resp: 17 16 18 20   Temp:      SpO2: 98% 100% 96% 99%  Weight:      Height:       No intake or output data in the 24 hours ending 06/29/17 0747 Filed Weights   06/28/17 1841  Weight: 70.8 kg (156 lb)    Exam:   General:  66 yo AAF WD WN NAD  Cardiovascular: RRR with no rubs or gallops  Respiratory: Rales at bases bilaterally  Abdomen: soft ND NT NBS x4  Musculoskeletal: No LE sensation or movement with contractures  Skin: decubitus ulcers, poa  Psychiatry: Mood is appropriate for condition and setting.   Data Reviewed: CBC: Recent Labs  Lab 06/28/17 2039  WBC 7.0  NEUTROABS 3.8  HGB 14.3  HCT 44.7  MCV 79.4  PLT 062   Basic Metabolic Panel: Recent Labs  Lab 06/28/17 2039  NA 143  K 3.5  CL 107  CO2 26  GLUCOSE 89  BUN 10  CREATININE 0.49  CALCIUM 9.2   GFR: Estimated Creatinine Clearance: 68.3 mL/min (by C-G formula based on SCr of 0.49 mg/dL). Liver Function Tests: Recent Labs  Lab 06/28/17 2039  AST 31  ALT 42  ALKPHOS 63  BILITOT 0.5  PROT 7.6  ALBUMIN 3.3*   No results for  input(s): LIPASE, AMYLASE in the last 168 hours. No results for input(s): AMMONIA in the last 168 hours. Coagulation Profile: No results for input(s): INR, PROTIME in the last 168 hours. Cardiac Enzymes: Recent Labs  Lab 06/29/17 0436  TROPONINI 0.03*   BNP (last 3 results) No results for input(s): PROBNP in the last 8760 hours. HbA1C: No results  for input(s): HGBA1C in the last 72 hours. CBG: No results for input(s): GLUCAP in the last 168 hours. Lipid Profile: No results for input(s): CHOL, HDL, LDLCALC, TRIG, CHOLHDL, LDLDIRECT in the last 72 hours. Thyroid Function Tests: No results for input(s): TSH, T4TOTAL, FREET4, T3FREE, THYROIDAB in the last 72 hours. Anemia Panel: No results for input(s): VITAMINB12, FOLATE, FERRITIN, TIBC, IRON, RETICCTPCT in the last 72 hours. Urine analysis:    Component Value Date/Time   COLORURINE YELLOW 06/10/2016 2037   APPEARANCEUR CLOUDY (A) 06/10/2016 2037   LABSPEC 1.023 06/10/2016 2037   PHURINE 5.5 06/10/2016 2037   GLUCOSEU NEGATIVE 06/10/2016 2037   HGBUR NEGATIVE 06/10/2016 2037   [place] NEGATIVE 06/10/2016 2037   KETONESUR NEGATIVE 06/10/2016 2037   PROTEINUR NEGATIVE 06/10/2016 2037   UROBILINOGEN 1.0 09/30/2010 1854   NITRITE POSITIVE (A) 06/10/2016 2037   LEUKOCYTESUR LARGE (A) 06/10/2016 2037   Sepsis Labs: @LABRCNTIP (procalcitonin:4,lacticidven:4)  )No results found for this or any previous visit (from the past 240 hour(s)).    Studies: Dg Chest 2 View  Result Date: 06/28/2017 CLINICAL DATA:  Pt has been being treated for pneumonia. Changing her antibiotics haven't worked yet. Very SOB at times. Pt takes HTN meds. Not diabetic. Smoker from age [age]. Pt hasn't been able to get any better since getting sick a few weeks ago. EXAM: CHEST  2 VIEW COMPARISON:  CT chest 06/10/2016 FINDINGS: Right jugular central venous catheter with the tip projecting over the cavoatrial junction. Right lower lobe airspace disease with a lucent area which may reflect an area of cavitation. Small right pleural effusion. Left lung is clear. No pneumothorax. Stable cardiomediastinal silhouette. No acute osseous abnormality. IMPRESSION: 1. Right lower lobe airspace disease with a lucent area which may reflect cavitary pneumonia. Small right pleural effusion. Electronically Signed   By: Kathreen Devoid   On: 06/28/2017 19:52   Ct Chest W Contrast  Result Date: 06/28/2017 CLINICAL DATA:  Chronic pneumonia. Shortness of breath. History of breast cancer. EXAM: CT CHEST WITH CONTRAST TECHNIQUE: Multidetector CT imaging of the chest was performed during intravenous contrast administration. CONTRAST:  19m ISOVUE-300 IOPAMIDOL (ISOVUE-300) INJECTION 61% COMPARISON:  Chest radiograph 06/28/2017, body CT 06/10/2016 FINDINGS: Cardiovascular: Mildly enlarged heart. Calcific atherosclerotic disease of the coronary arteries. No evidence of central pulmonary embolus. Normal caliber of the aorta. Mediastinum/Nodes: 2 borderline pathologic by CT criteria but rounded right pretracheal lymph nodes, the larger measuring 10 mm in short axis. Normal appearance of the trachea. Large hiatal hernia. Lungs/Pleura: There is a large right pleural effusion. There is a near complete collapse of the right middle lobe and complete collapse of the right lower lobe. There is a small left pleural effusion with minimal atelectasis in the left lung base. Upper Abdomen: No acute abnormality. Musculoskeletal: Posttraumatic versus congenital deformity of T8 vertebral body with bony fusion of the right posterior seventh and eighth ribs and left eighth costovertebral junction. Stable associated kyphotic deformity. IMPRESSION: Large right pleural effusion. Near complete collapse of the right middle lobe and complete collapse of the right lower lobe. No endobronchial lesions are seen to account for that. Re-evaluation after resolution  of the pleural effusion may be considered. Two borderline abnormal right pretracheal lymph nodes, both could be reactive or malignant. Small left pleural effusion with sub segmental atelectasis in the left lung base. Large hiatal hernia. Electronically Signed   By: Fidela Salisbury M.D.   On: 06/28/2017 23:14    Scheduled Meds: . bisacodyl  10 mg Rectal QODAY  . carvedilol  6.25 mg Oral Daily  . diclofenac  sodium  4 g Topical QID  . enoxaparin (LOVENOX) injection  40 mg Subcutaneous QHS  . escitalopram  15 mg Oral Daily  . feeding supplement (PRO-STAT SUGAR FREE 64)  30 mL Oral BID BM  . furosemide  40 mg Intravenous BID  . hydroxychloroquine  100 mg Oral Daily  . lidocaine  1 patch Transdermal Q24H  . linaclotide  290 mcg Oral Daily  . losartan  25 mg Oral Daily  . methocarbamol  750 mg Oral BID  . metoCLOPramide  5 mg Oral TID  . nystatin  5 mL Oral QID  . oxybutynin  5 mg Oral BID  . pantoprazole  40 mg Oral Daily  . senna-docusate  2 tablet Oral QHS    Continuous Infusions:   LOS: 0 days     Kayleen Memos, MD [facility] Pager [phone]  If 7PM-7AM, please contact night-coverage [email or web address] Password [email or web address] 06/29/2017, 7:47 AM

## 2017-06-29 NOTE — Progress Notes (Signed)
  Echocardiogram 2D Echocardiogram has been performed.  Darlina Sicilian M 06/29/2017, 2:50 PM

## 2017-06-29 NOTE — Consult Note (Signed)
Cardiology Consultation:   Patient ID: [name]; [id number]; August 25, 1950   Admit date: 06/28/2017 Date of Consult: 06/29/2017  Primary Care Provider: Patient, No Pcp Per Primary Cardiologist: Dr. Percival [name]   Patient Profile:   [name] is a 66 y.o. female with a hx of paraplegia-wheelchair bound, diastolic CHF, rheumatoid arthritis, breast cander who is being seen today for the evaluation of CHF at the request of Dr. [place] [name].  History of Present Illness:   [name] presented to the [facility] ED for worsening pneumonia. She was diagnosed with pneumonia on 05/13/17 and had been treated with multiple antibiotics. Despite antibiotics she stated that she had no improvement in her symptoms. Serial chest xrays showed progressively worsening PNA. She has had a continued dry cough and is now requiring supplemental oxygen as of about 2 weeks ago.   She reports that she has had occ orthopnea for the last 2-3 months while she has been battling the pneumonia. She denies PND, palpitations or chest pain/pressure. She does have long standing chest muscle spasms related to her paraplegia. She has occ lightheadedness when the staff turn her in bed. She also has mild chronic edema of the left foot, but no other significant edema. She is currently laying almost flat without dyspnea.   She had breast cancer twice, 15 years ago treated with chemotherapy and again 5 years ago treated with mastectomy. She did not have any radiation. She quit smoking about 40 years ago and drinks an occasional glass of wine on holidays. Prior to her respiratory illness she was in her usual state of health moving around her living facility as usual and participating in activity classes.   [name] was last seen in our office by Dr. Percival [name] on 06/28/2015 for follow up of heart failure at which time she was noted to be asymptomatic and with well controlled BP. An echo was checked which showed normal LV systolic function and  grade 1 Diastolic dysfunction. Her wt at the time was 168 lbs.   Significant findings BNP 704.9 Troponin 0.03 K+ 3.5,   SCr 0.49 Lactic acid 0.99 WBC 7.0 Hgb 14.3  CXR : Right lower lobe airspace disease with a lucent area which may reflect cavitary pneumonia. Small right pleural effusion.  Chest CT: Large right pleural effusion. Near complete collapse of the right middle lobe and complete collapse of the right lower lobe. No endobronchial lesions are seen to account for that. Re-evaluation after resolution of the pleural effusion may be considered. Two borderline abnormal right pretracheal lymph nodes, both could be reactive or malignant. Small left pleural effusion with sub segmental atelectasis in the left lung base. Large hiatal hernia.   Past Medical History:  Diagnosis Date  . Anemia 10/12/2008  . CHF (congestive heart failure) ([place])    last Echo 2016?  [facility] Kitchen Diaphragmatic hernia without mention of obstruction or gangrene 08/29/2010  . Edema 05/20/2009  . Gastroparesis 10/05/2010  . GERD (gastroesophageal reflux disease) 02/02/2009  . Hypertension 10/12/2008  . Hypotension, unspecified 05/20/2009  . Immobility syndrome (paraplegic) 1977  . Iron deficiency anemia, unspecified 10/12/2008  . Leiomyoma of uterus   . Malignant neoplasm of breast (female), unspecified site 10/05/2010   2003, 2013   . Rheumatoid arthritis ([place]) 01/17/2009    Past Surgical History:  Procedure Laterality Date  . [place]  . BREAST SURGERY  2003   Bilateral mastectomy  . IR FLUORO GUIDE CV LINE RIGHT  06/23/2017  .  IR US GUIDE VASC ACCESS RIGHT  06/23/2017  . PRESSURE ULCER DEBRIDEMENT  2009   on back  . WOUND DEBRIDEMENT  09/02/2008   Large sacral back open wound     Home Medications:  Prior to Admission medications   Medication Sig Start Date End Date Taking? Authorizing Provider  acetaminophen (TYLENOL) 325 MG tablet Take 650 mg by mouth every 6 (six) hours  as needed.   Yes [provider]  Amino Acids-Protein Hydrolys (FEEDING SUPPLEMENT, PRO-STAT SUGAR FREE 64,) LIQD Take 30 mLs by mouth. 30 cc BID PO QD   Yes [provider]  baclofen (LIORESAL) 10 MG tablet Take 10 mg by mouth 3 (three) times daily as needed.   Yes [provider]  bisacodyl (DULCOLAX) 10 MG suppository Place 10 mg rectally every other day.    Yes [provider]  calcium carbonate (TUMS - DOSED IN MG ELEMENTAL CALCIUM) 500 MG chewable tablet Chew 2 tablets by mouth every 8 (eight) hours as needed for indigestion or heartburn.   Yes [provider]  calcium-vitamin D (OYSTER CALCIUM 500 + D) 500-200 MG-UNIT per tablet Take 1 tablet by mouth 2 (two) times daily.    Yes [provider]  carvedilol (COREG) 6.25 MG tablet Take 6.25 mg by mouth daily. Hold if systolic BP is less than 893   Yes [provider]  cetirizine (ZYRTEC ALLERGY) 10 MG tablet Take 10 mg by mouth daily as needed for allergies.    Yes [provider]  cholecalciferol (VITAMIN D) 1000 units tablet Take 1,000 Units by mouth daily.   Yes [provider]  Cranberry 425 MG CAPS Take 425 mg by mouth daily.   Yes [provider]  cyclobenzaprine (FLEXERIL) 10 MG tablet Take 10 mg by mouth daily as needed for muscle spasms.   Yes [provider]  diclofenac sodium (VOLTAREN) 1 % GEL Apply 3 g topically 4 (four) times daily.   Yes [provider]  diphenhydrAMINE (BENADRYL) 25 MG tablet Take 25 mg by mouth every 8 (eight) hours as needed for itching.   Yes [provider]  escitalopram (LEXAPRO) 5 MG tablet Take 15 mg by mouth daily.   Yes [provider]  fluticasone (FLONASE) 50 MCG/ACT nasal spray Place 2 sprays into both nostrils daily as needed for allergies.    Yes [provider]  furosemide (LASIX) 20 MG tablet Take 20 mg by mouth daily.   Yes [provider]    HYDROcodone-acetaminophen (NORCO) 7.5-325 MG tablet Take 1 tablet by mouth every 6 (six) hours as needed for moderate pain.   Yes [provider]  hydrocortisone 2.5 % cream Apply 1 application topically 2 (two) times daily. To rash   Yes [provider]  hydroxychloroquine (PLAQUENIL) 200 MG tablet Take 100 mg by mouth daily.    Yes [provider]  Lidocaine (LIDODERM EX) Apply topically. Apply to right shoulder topically every 12 hours for pain. On in AM and off in PM   Yes [provider]  Linaclotide (LINZESS) 290 MCG CAPS capsule Take 1 capsule (290 mcg total) by mouth daily. 07/22/14  Yes Gerlene Fee, NP  losartan (COZAAR) 25 MG tablet Take 25 mg by mouth daily.   Yes [provider]  methocarbamol (ROBAXIN) 750 MG tablet Take 750 mg by mouth 2 (two) times daily.    Yes [provider]  metoCLOPramide (REGLAN) 5 MG tablet Take 5 mg by mouth 3 (three)  times daily.   Yes [provider]  Multiple Vitamins-Minerals (DECUBI-VITE) CAPS Take 1 capsule by mouth daily.   Yes [provider]  nystatin (MYCOSTATIN) 100000 UNIT/ML suspension Take 5 mLs by mouth 4 (four) times daily.   Yes [provider]  omeprazole (PRILOSEC) 20 MG capsule Take 20 mg by mouth daily.   Yes [provider]  oxybutynin (DITROPAN) 5 MG tablet Take 5 mg by mouth 2 (two) times daily.   Yes [provider]  Prenatal Multivit-Min-Fe-FA (PRENATAL 1 + IRON PO) Take 1 tablet by mouth daily.   Yes [provider]  promethazine (PHENERGAN) 25 MG tablet Take 25 mg by mouth every 6 (six) hours as needed for nausea or vomiting.   Yes [provider]  Salicylic Acid (NEUTROGENA OIL-FREE ACNE [place] EX) Apply 1 application topically every 8 (eight) hours as needed (for acne on face).    Yes [provider]  sennosides-docusate sodium (SENOKOT-S) 8.6-50 MG tablet Take 2 tablets by mouth at bedtime.   Yes  [provider]    Inpatient Medications: Scheduled Meds: . bisacodyl  10 mg Rectal QODAY  . carvedilol  6.25 mg Oral Daily  . diclofenac sodium  4 g Topical QID  . enoxaparin (LOVENOX) injection  40 mg Subcutaneous QHS  . escitalopram  15 mg Oral Daily  . feeding supplement (PRO-STAT SUGAR FREE 64)  30 mL Oral BID BM  . furosemide  40 mg Intravenous BID  . hydroxychloroquine  100 mg Oral Daily  . lidocaine  1 patch Transdermal Q24H  . linaclotide  290 mcg Oral Daily  . losartan  25 mg Oral Daily  . methocarbamol  750 mg Oral BID  . metoCLOPramide  5 mg Oral TID  . nystatin  5 mL Oral QID  . oxybutynin  5 mg Oral BID  . pantoprazole  40 mg Oral Daily  . senna-docusate  2 tablet Oral QHS   Continuous Infusions:  PRN Meds: acetaminophen, baclofen, cyclobenzaprine, diphenhydrAMINE, fluticasone, HYDROcodone-acetaminophen  Allergies:   No Known Allergies  Social History:   Social History   Socioeconomic History  . Marital status: Single    Spouse name: Not on file  . Number of children: Not on file  . Years of education: Not on file  . Highest education level: Not on file  Social Needs  . Financial resource strain: Not on file  . Food insecurity - worry: Not on file  . Food insecurity - inability: Not on file  . Transportation needs - medical: Not on file  . Transportation needs - non-medical: Not on file  Occupational History  . Not on file  Tobacco Use  . Smoking status: Former Smoker    Packs/day: 1.00    Years: 10.00    Pack years: 10.00    Last attempt to quit: 07/28/1995    Years since quitting: 21.9  . Smokeless tobacco: Never Used  Substance and Sexual Activity  . Alcohol use: No  . Drug use: No  . Sexual activity: Not on file  Other Topics Concern  . 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     Has no children.     Education: Law degree.    Family History:    Family History    Problem Relation Age of Onset  . Stroke Mother   . Hypertension Mother   . Cancer Maternal Aunt  unsure of what kind  . Hypertension Maternal Aunt   . Hypertension Maternal Uncle      ROS:  Please see the history of present illness.  ROS  All other ROS reviewed and negative.     Physical Exam/Data:   Vitals:   06/29/17 0035 06/29/17 0237 06/29/17 0438 06/29/17 0635  BP: 99/78 106/75 102/78 109/80  Pulse: 85 81 83 84  Resp: 17 16 18 20   Temp:      SpO2: 98% 100% 96% 99%  Weight:      Height:       No intake or output data in the 24 hours ending 06/29/17 0723 Filed Weights   06/28/17 1841  Weight: 156 lb (70.8 kg)   Body mass index is 25.96 kg/m.  General:  Well nourished, well developed, in no acute distress HEENT: normal Lymph: no adenopathy Neck: no JVD Endocrine:  No thryomegaly Vascular: No carotid bruits; FA pulses 2+ bilaterally without bruits  Cardiac:  normal S1, S2; RRR; 2/6 soft systolic murmur at LUSB Lungs:  clear to auscultation bilaterally, no wheezing, rhonchi or rales  Abd: soft, nontender, no hepatomegaly  Ext: trace chronic edema of left foot Musculoskeletal:  Pt is paraplegic, Lower extremities with muscle wasting Skin: warm and dry  Neuro:  CNs 2-12 intact, no focal abnormalities noted Psych:  Normal affect   EKG:  The EKG was personally reviewed and demonstrates:  Sinus rhythm, 83 bpm, abnormal R wave progression, LVH, non-specific T wave abnormalities inferiorly and laterally Telemetry:  Telemetry was personally reviewed and demonstrates:  Sinus rhythm in the 80's  Relevant CV Studies:  Echocardiogram 11/02/2014  Study Conclusions  - Left ventricle: The cavity size was normal. Wall thickness was normal. Systolic function was normal. The estimated ejection fraction was in the range of 60% to 65%. Doppler parameters are consistent with abnormal left ventricular relaxation (grade 1 diastolic dysfunction). - Mitral valve:  There was mild regurgitation.  Laboratory Data:  Chemistry Recent Labs  Lab 06/28/17 2039  NA 143  K 3.5  CL 107  CO2 26  GLUCOSE 89  BUN 10  CREATININE 0.49  CALCIUM 9.2  GFRNONAA >60  GFRAA >60  ANIONGAP 10    Recent Labs  Lab 06/28/17 2039  PROT 7.6  ALBUMIN 3.3*  AST 31  ALT 42  ALKPHOS 63  BILITOT 0.5   Hematology Recent Labs  Lab 06/28/17 2039  WBC 7.0  RBC 5.63*  HGB 14.3  HCT 44.7  MCV 79.4  MCH 25.4*  MCHC 32.0  RDW 16.3*  PLT 207   Cardiac Enzymes Recent Labs  Lab 06/29/17 0436  TROPONINI 0.03*   No results for input(s): TROPIPOC in the last 168 hours.  BNP Recent Labs  Lab 06/28/17 2039  BNP 704.9*    DDimer No results for input(s): DDIMER in the last 168 hours.  Radiology/Studies:  Dg Chest 2 View  Result Date: 06/28/2017 CLINICAL DATA:  Pt has been being treated for pneumonia. Changing her antibiotics haven't worked yet. Very SOB at times. Pt takes HTN meds. Not diabetic. Smoker from age [age]. Pt hasn't been able to get any better since getting sick a few weeks ago. EXAM: CHEST  2 VIEW COMPARISON:  CT chest 06/10/2016 FINDINGS: Right jugular central venous catheter with the tip projecting over the cavoatrial junction. Right lower lobe airspace disease with a lucent area which may reflect an area of cavitation. Small right pleural effusion. Left lung is clear. No pneumothorax. Stable cardiomediastinal silhouette.  No acute osseous abnormality. IMPRESSION: 1. Right lower lobe airspace disease with a lucent area which may reflect cavitary pneumonia. Small right pleural effusion. Electronically Signed   By: Kathreen Devoid   On: 06/28/2017 19:52   Ct Chest W Contrast  Result Date: 06/28/2017 CLINICAL DATA:  Chronic pneumonia. Shortness of breath. History of breast cancer. EXAM: CT CHEST WITH CONTRAST TECHNIQUE: Multidetector CT imaging of the chest was performed during intravenous contrast administration. CONTRAST:  21mL ISOVUE-300 IOPAMIDOL  (ISOVUE-300) INJECTION 61% COMPARISON:  Chest radiograph 06/28/2017, body CT 06/10/2016 FINDINGS: Cardiovascular: Mildly enlarged heart. Calcific atherosclerotic disease of the coronary arteries. No evidence of central pulmonary embolus. Normal caliber of the aorta. Mediastinum/Nodes: 2 borderline pathologic by CT criteria but rounded right pretracheal lymph nodes, the larger measuring 10 mm in short axis. Normal appearance of the trachea. Large hiatal hernia. Lungs/Pleura: There is a large right pleural effusion. There is a near complete collapse of the right middle lobe and complete collapse of the right lower lobe. There is a small left pleural effusion with minimal atelectasis in the left lung base. Upper Abdomen: No acute abnormality. Musculoskeletal: Posttraumatic versus congenital deformity of T8 vertebral body with bony fusion of the right posterior seventh and eighth ribs and left eighth costovertebral junction. Stable associated kyphotic deformity. IMPRESSION: Large right pleural effusion. Near complete collapse of the right middle lobe and complete collapse of the right lower lobe. No endobronchial lesions are seen to account for that. Re-evaluation after resolution of the pleural effusion may be considered. Two borderline abnormal right pretracheal lymph nodes, both could be reactive or malignant. Small left pleural effusion with sub segmental atelectasis in the left lung base. Large hiatal hernia. Electronically Signed   By: Fidela Salisbury M.D.   On: 06/28/2017 23:14    Assessment and Plan:   Acute on chronic diastolic heart failure -In setting of recent pneumonia with hypoxia. Treated with multiple antibiotics but progressively worsening on CXR. Now with normal WBCs, afebrile.  -BNP elevated at 704.9. Trop 0.03 -Pt without significant edema or orthopnea. No wt gain. Wt at office visit in 06/2015 was 168 lbs. Current admission wt is 156 lbs.  -EKG shows LVH, early r wave progression, and  non-specific T changes laterally and inferiorly which is similar to previous EKG in 05/2016 -Pt has large right sided pleural effusion on CT, could be secondary to her CHF or malignant process (has hx of breast cancer twice) -IR is being consulted for therapeutic/diagnostic thoracentesis -Last echo in 10/2014 showed normal LV function with EF 60-65% and grade 1 DD -Repeat echo is ordered. -The patient is started on lasix 40 mg IV BID. Continue and follow renal function.  -Continue carvedilol, losartan  Hypertension -Managed on carvedilol 6.25 mg, losartan 25 mg -DBP elevated on admission, now improved  For questions or updates, please contact [place] Please consult [email or web address] for contact info under Cardiology/STEMI.   Signed, Daune Perch, NP  06/29/2017 7:23 AM

## 2017-06-29 NOTE — ED Notes (Signed)
Patient refusing to go to ultrasound at this time. Will notify MD.

## 2017-06-30 ENCOUNTER — Inpatient Hospital Stay (HOSPITAL_COMMUNITY): Payer: Medicare Other

## 2017-06-30 DIAGNOSIS — I5021 Acute systolic (congestive) heart failure: Secondary | ICD-10-CM

## 2017-06-30 DIAGNOSIS — I5041 Acute combined systolic (congestive) and diastolic (congestive) heart failure: Secondary | ICD-10-CM

## 2017-06-30 DIAGNOSIS — J9621 Acute and chronic respiratory failure with hypoxia: Secondary | ICD-10-CM

## 2017-06-30 LAB — BASIC METABOLIC PANEL
ANION GAP: 8 (ref 5–15)
BUN: 11 mg/dL (ref 6–20)
CALCIUM: 8.9 mg/dL (ref 8.9–10.3)
CO2: 29 mmol/L (ref 22–32)
Chloride: 103 mmol/L (ref 101–111)
Creatinine, Ser: 0.52 mg/dL (ref 0.44–1.00)
GLUCOSE: 103 mg/dL — AB (ref 65–99)
POTASSIUM: 3.4 mmol/L — AB (ref 3.5–5.1)
SODIUM: 140 mmol/L (ref 135–145)

## 2017-06-30 LAB — CBC
HCT: 43.4 % (ref 36.0–46.0)
Hemoglobin: 13.9 g/dL (ref 12.0–15.0)
MCH: 25.7 pg — ABNORMAL LOW (ref 26.0–34.0)
MCHC: 32 g/dL (ref 30.0–36.0)
MCV: 80.2 fL (ref 78.0–100.0)
PLATELETS: 225 10*3/uL (ref 150–400)
RBC: 5.41 MIL/uL — AB (ref 3.87–5.11)
RDW: 16.2 % — AB (ref 11.5–15.5)
WBC: 7.2 10*3/uL (ref 4.0–10.5)

## 2017-06-30 LAB — PH, BODY FLUID: pH, Body Fluid: 7.9

## 2017-06-30 MED ORDER — CYCLOBENZAPRINE HCL 10 MG PO TABS: 10.0000 mg | ORAL_TABLET | Freq: Every day | ORAL | Status: DC | PRN

## 2017-06-30 MED ORDER — POTASSIUM CHLORIDE CRYS ER 20 MEQ PO TBCR
40.0000 meq | EXTENDED_RELEASE_TABLET | Freq: Once | ORAL | Status: AC
  Administered 2017-06-30: 40 meq via ORAL
  Filled 2017-06-30: qty 2

## 2017-06-30 NOTE — Progress Notes (Signed)
PROGRESS NOTE  [name]:[id number] DOB: Jul 31, 1950 DOA: 06/28/2017 PCP: Jodi Marble, MD  HPI/Recap of past 24 hours: [name] is a 66 y.o. female with medical history significant of diastolic CHF, paraplegia wheelchair-bound, breast cancer, and rheumatoid arthritis; who presented with complaints of worsening cough.  Patient reports being treated for pna previously with multiple rounds of antibiotics before presentation without improvement of her symptoms. Reports chronic hypoxia on 2L O2 supplementation continuously.  Pt was seen and examined at her bedside. She reports she feels a lot better and her cough has subsided post right thoracentesis POD #1. Denies chest pain/ dyspnea. Cardiology following and recommends LHC due to LVEF. 20-25% (06/29/17) from 60-65% (11/02/2014). Patient will discuss with her sister. She has no children.   Assessment/Plan: Active Problems:   Rheumatoid arthritis (HCC)   Essential hypertension   Paraplegia (HCC)   CHF (congestive heart failure) (HCC)   History of breast cancer   Pleural effusion on right   Respiratory failure with hypoxia (HCC)   SOB (shortness of breath)   Pressure injury of skin   Acute on chronic respiratory failure with hypoxemia (HCC)   Acute systolic (congestive) heart failure ([place])   Assessment/Plan  Newly diagnosed Heart failure with reduced EF 20-25% post TTE 06/29/17 LVEF 20-25% from 60-65% (11/02/14) -cardiology following -Cardio recommends LHC; pt will discuss with her sister -carvedilol, losartan, not on asa or statin -lipid panel;  Acute right pleural effusion: -large right-sided pleural effusion.   -Suspect secondary to CHF. -post right thoracentesis with 1.2L yellow fluid removed (31/4/97)  Diastolic CHF: Acute on chronic.  -last EF 60-65% with grade 1 diastolic dysfunction in 0/2637.  -BNP 704. -Strict intake and output, daily weights -Lasix 40 mg IV Bid -2D echocardiogram 06/29/17 revealed  LVEF 20-25% with grade 2 diastolic dysfunction with moderately increased PA pressure 51 mmhg.  Acute on chronic respiratory failure most likely 2/2 to right pleural effusion.  -resolved saturating 94% RA. -previously on 2L continuously -Continuous pulse oximetry with nasal cannula oxygen to keep O2 saturation greater or equal to 92%  Essential hypertension -stable -Continue Coreg and losartan  Rheumatoid arthritis -stable -Continue Plaquenil  Paraplegia -stable -Continue baclofen  Hypokalemia -K+ 3.4 -repleted -BMP am   Code Status: Full  Family Communication: No family members at bedside.  Disposition Plan: will stay another  Midnight to continue present management. Possible LHC if patient agrees.   Consultants: cardiology  Procedures:  None  Antimicrobials:  None  DVT prophylaxis:  lovenox sq 40 mg daily   Objective: Vitals:   06/29/17 1648 06/29/17 2115 06/30/17 0233 06/30/17 0518  BP: 108/76 (!) 75/53  97/64  Pulse:  80  82  Resp:  (!) 22  20  Temp:  97.8 F (36.6 C)  98.6 F (37 C)  TempSrc:  Oral  Oral  SpO2:    94%  Weight:   70.8 kg (156 lb 1.4 oz)   Height:        Intake/Output Summary (Last 24 hours) at 06/30/2017 1229 Last data filed at 06/30/2017 0814 Gross per 24 hour  Intake 480 ml  Output -  Net 480 ml   Filed Weights   06/28/17 1841 06/30/17 0233  Weight: 70.8 kg (156 lb) 70.8 kg (156 lb 1.4 oz)    Exam:   General:  66 yo AAF WD WN NAD  Cardiovascular: RRR with no rubs or gallops  Respiratory: Rales at bases bilaterally  Abdomen: soft ND NT NBS x4  Musculoskeletal: No LE sensation or movement with contractures  Skin: decubitus ulcers, poa  Psychiatry: Mood is appropriate for condition and setting.   Data Reviewed: CBC: Recent Labs  Lab 06/28/17 2039 06/30/17 0809  WBC 7.0 7.2  NEUTROABS 3.8  --   HGB 14.3 13.9  HCT 44.7 43.4  MCV 79.4 80.2  PLT 207 573   Basic Metabolic Panel: Recent Labs  Lab  06/28/17 2039 06/30/17 0809  NA 143 140  K 3.5 3.4*  CL 107 103  CO2 26 29  GLUCOSE 89 103*  BUN 10 11  CREATININE 0.49 0.52  CALCIUM 9.2 8.9   GFR: Estimated Creatinine Clearance: 68.3 mL/min (by C-G formula based on SCr of 0.52 mg/dL). Liver Function Tests: Recent Labs  Lab 06/28/17 2039  AST 31  ALT 42  ALKPHOS 63  BILITOT 0.5  PROT 7.6  ALBUMIN 3.3*   No results for input(s): LIPASE, AMYLASE in the last 168 hours. No results for input(s): AMMONIA in the last 168 hours. Coagulation Profile: No results for input(s): INR, PROTIME in the last 168 hours. Cardiac Enzymes: Recent Labs  Lab 06/29/17 0436 06/29/17 0907  TROPONINI 0.03* 0.04*   BNP (last 3 results) No results for input(s): PROBNP in the last 8760 hours. HbA1C: No results for input(s): HGBA1C in the last 72 hours. CBG: No results for input(s): GLUCAP in the last 168 hours. Lipid Profile: No results for input(s): CHOL, HDL, LDLCALC, TRIG, CHOLHDL, LDLDIRECT in the last 72 hours. Thyroid Function Tests: No results for input(s): TSH, T4TOTAL, FREET4, T3FREE, THYROIDAB in the last 72 hours. Anemia Panel: No results for input(s): VITAMINB12, FOLATE, FERRITIN, TIBC, IRON, RETICCTPCT in the last 72 hours. Urine analysis:    Component Value Date/Time   COLORURINE YELLOW 06/10/2016 2037   APPEARANCEUR CLOUDY (A) 06/10/2016 2037   LABSPEC 1.023 06/10/2016 2037   PHURINE 5.5 06/10/2016 2037   GLUCOSEU NEGATIVE 06/10/2016 2037   HGBUR NEGATIVE 06/10/2016 2037   BILIRUBINUR NEGATIVE 06/10/2016 2037   KETONESUR NEGATIVE 06/10/2016 2037   PROTEINUR NEGATIVE 06/10/2016 2037   UROBILINOGEN 1.0 09/30/2010 1854   NITRITE POSITIVE (A) 06/10/2016 2037   LEUKOCYTESUR LARGE (A) 06/10/2016 2037   Sepsis Labs: @LABRCNTIP (procalcitonin:4,lacticidven:4)  ) Recent Results (from the past 240 hour(s))  Blood culture (routine x 2)     Status: None (Preliminary result)   Collection Time: 06/28/17  8:08 PM  Result  Value Ref Range Status   Specimen Description BLOOD RIGHT ANTECUBITAL  Final   Special Requests IN PEDIATRIC BOTTLE Blood Culture adequate volume  Final   Culture   Final    NO GROWTH < 12 HOURS Performed at [facility], [place] [address] [place], [place] [address]    Report Status PENDING  Incomplete  Blood culture (routine x 2)     Status: None (Preliminary result)   Collection Time: 06/28/17  8:25 PM  Result Value Ref Range Status   Specimen Description BLOOD RIGHT PORTA CATH  Final   Special Requests   Final    BOTTLES DRAWN AEROBIC AND ANAEROBIC Blood Culture adequate volume   Culture   Final    NO GROWTH < 12 HOURS Performed at [facility], [place] [address], [place], [place] [address]    Report Status PENDING  Incomplete  Gram stain     Status: None   Collection Time: 06/29/17  4:33 PM  Result Value Ref Range Status   Specimen Description FLUID PLEURAL RIGHT  Final   Special  Requests NONE  Final   Gram Stain   Final    MODERATE WBC PRESENT,BOTH PMN AND MONONUCLEAR NO ORGANISMS SEEN Performed at [facility], [place] [address] [place], [place] [address]    Report Status 06/29/2017 FINAL  Final  MRSA PCR Screening     Status: None   Collection Time: 06/29/17  6:21 PM  Result Value Ref Range Status   MRSA by PCR NEGATIVE NEGATIVE Final    Comment:        The GeneXpert MRSA Assay (FDA approved for NASAL specimens only), is one component of a comprehensive MRSA colonization surveillance program. It is not intended to diagnose MRSA infection nor to guide or monitor treatment for MRSA infections.       Studies: Dg Chest 1 View  Result Date: 06/29/2017 CLINICAL DATA:  Right-sided thoracentesis. EXAM: CHEST 1 VIEW COMPARISON:  CXR 06/28/2017 FINDINGS: No pneumothorax status post right-sided thoracentesis. Marked improvement in aeration of the right lung with marked decrease in right-sided pleural fluid. Residual atelectatic lung is seen about the right  hilum and right lung base. The patient is slightly rotated and tilted on this view. There is cardiomegaly. Right IJ catheter tip is seen at the cavoatrial juncture. No acute osseous appearing abnormality. Osteoarthritis of both shoulders. IMPRESSION: 1. No pneumothorax status post right-sided thoracentesis. 2. Marked reduction of right-sided pleural fluid with only a scant amount seen along the periphery of the right lung. Electronically Signed   By: Ashley Royalty M.D.   On: 06/29/2017 17:18   Dg Chest Port 1 View  Result Date: 06/30/2017 CLINICAL DATA:  Status post thoracentesis yesterday. History of CHF, breast malignancy, hypertension, lower extremity paraplegia. EXAM: PORTABLE CHEST 1 VIEW COMPARISON:  Chest x-ray of June 29, 2017 FINDINGS: The lungs are adequately inflated. There is a small right pleural effusion which has not appreciably changed since the previous study. No pneumothorax is observed. The left lung is clear. The cardiac silhouette is enlarged but stable. The pulmonary vascularity is normal. The right internal jugular venous catheter tip projects over the midportion of the SVC. IMPRESSION: No late pneumothorax on the right. Stable small right pleural effusion. Stable cardiomegaly without pulmonary edema. Electronically Signed   By: David  Martinique M.D.   On: 06/30/2017 10:43   US Thoracentesis Asp Pleural Space W/img Guide  Result Date: 06/29/2017 INDICATION: CHF, breast cancer, pneumonia, dyspnea, right pleural effusion. Request made for diagnostic and therapeutic right thoracentesis. EXAM: ULTRASOUND GUIDED DIAGNOSTIC AND THERAPEUTIC RIGHT THORACENTESIS MEDICATIONS: None. COMPLICATIONS: None immediate. PROCEDURE: An ultrasound guided thoracentesis was thoroughly discussed with the patient and questions answered. The benefits, risks, alternatives and complications were also discussed. The patient understands and wishes to proceed with the procedure. Written consent was obtained.  Ultrasound was performed to localize and mark an adequate pocket of fluid in the right chest. The area was then prepped and draped in the normal sterile fashion. 2% Lidocaine was used for local anesthesia. Under ultrasound guidance a Safe-T-Centesis catheter was introduced. Thoracentesis was performed. The catheter was removed and a dressing applied. FINDINGS: A total of approximately 1.2 liters of yellow fluid was removed. Samples were sent to the laboratory as requested by the clinical team. IMPRESSION: Successful ultrasound guided diagnostic and therapeutic right thoracentesis yielding 1.2 liters of pleural fluid. Read by: Rowe Robert, PA-C Electronically Signed   By: Jerilynn Mages.  Shick M.D.   On: 06/29/2017 16:55    Scheduled Meds: . bisacodyl  10 mg Rectal QODAY  . carvedilol  6.25 mg Oral Daily  . diclofenac sodium  4 g Topical QID  . enoxaparin (LOVENOX) injection  40 mg Subcutaneous QHS  . escitalopram  15 mg Oral Daily  . feeding supplement (PRO-STAT SUGAR FREE 64)  30 mL Oral BID BM  . furosemide  40 mg Intravenous BID  . hydroxychloroquine  100 mg Oral Daily  . lidocaine  1 patch Transdermal Q24H  . linaclotide  290 mcg Oral Daily  . losartan  25 mg Oral Daily  . methocarbamol  750 mg Oral BID  . metoCLOPramide  5 mg Oral TID  . nystatin  5 mL Oral QID  . oxybutynin  5 mg Oral BID  . pantoprazole  40 mg Oral Daily  . senna-docusate  2 tablet Oral QHS    Continuous Infusions:   LOS: 1 day     Kayleen Memos, MD [facility] Pager [phone]  If 7PM-7AM, please contact night-coverage [email or web address] Password [facility] 06/30/2017, 12:29 PM

## 2017-06-30 NOTE — Progress Notes (Signed)
Progress Note  Patient Name: [name] Date of Encounter: 06/30/2017  Primary Cardiologist: Dr Percival Spanish  Subjective   Pt is breathing better. No chest pain except soreness at thoracentesis site.   Inpatient Medications    Scheduled Meds: . bisacodyl  10 mg Rectal QODAY  . carvedilol  6.25 mg Oral Daily  . diclofenac sodium  4 g Topical QID  . enoxaparin (LOVENOX) injection  40 mg Subcutaneous QHS  . escitalopram  15 mg Oral Daily  . feeding supplement (PRO-STAT SUGAR FREE 64)  30 mL Oral BID BM  . furosemide  40 mg Intravenous BID  . hydroxychloroquine  100 mg Oral Daily  . lidocaine  1 patch Transdermal Q24H  . linaclotide  290 mcg Oral Daily  . losartan  25 mg Oral Daily  . methocarbamol  750 mg Oral BID  . metoCLOPramide  5 mg Oral TID  . nystatin  5 mL Oral QID  . oxybutynin  5 mg Oral BID  . pantoprazole  40 mg Oral Daily  . senna-docusate  2 tablet Oral QHS   Continuous Infusions:  PRN Meds: acetaminophen, baclofen, calcium carbonate, cyclobenzaprine, diphenhydrAMINE, fluticasone, HYDROcodone-acetaminophen   Vital Signs    Vitals:   06/29/17 1648 06/29/17 2115 06/30/17 0233 06/30/17 0518  BP: 108/76 (!) 75/53  97/64  Pulse:  80  82  Resp:  (!) 22  20  Temp:  97.8 F (36.6 C)  98.6 F (37 C)  TempSrc:  Oral  Oral  SpO2:    94%  Weight:   156 lb 1.4 oz (70.8 kg)   Height:        Intake/Output Summary (Last 24 hours) at 06/30/2017 1019 Last data filed at 06/30/2017 0814 Gross per 24 hour  Intake 480 ml  Output -  Net 480 ml   Filed Weights   06/28/17 1841 06/30/17 0233  Weight: 156 lb (70.8 kg) 156 lb 1.4 oz (70.8 kg)    Telemetry    Non-tele  ECG     - Personally Reviewed  Physical Exam   GEN: No acute distress.   Neck: No JVD Cardiac: RRR, no murmurs, rubs, or gallops.  Respiratory: Clear to auscultation bilaterally, diminished in the right base GI: Soft, nontender, non-distended  MS: Trace edema of left foot;  paraplegic Neuro:  Nonfocal  Psych: Normal affect   Labs    Chemistry Recent Labs  Lab 06/28/17 2039 06/30/17 0809  NA 143 140  K 3.5 3.4*  CL 107 103  CO2 26 29  GLUCOSE 89 103*  BUN 10 11  CREATININE 0.49 0.52  CALCIUM 9.2 8.9  PROT 7.6  --   ALBUMIN 3.3*  --   AST 31  --   ALT 42  --   ALKPHOS 63  --   BILITOT 0.5  --   GFRNONAA >60 >60  GFRAA >60 >60  ANIONGAP 10 8     Hematology Recent Labs  Lab 06/28/17 2039 06/30/17 0809  WBC 7.0 7.2  RBC 5.63* 5.41*  HGB 14.3 13.9  HCT 44.7 43.4  MCV 79.4 80.2  MCH 25.4* 25.7*  MCHC 32.0 32.0  RDW 16.3* 16.2*  PLT 207 225    Cardiac Enzymes Recent Labs  Lab 06/29/17 0436 06/29/17 0907  TROPONINI 0.03* 0.04*   No results for input(s): TROPIPOC in the last 168 hours.   BNP Recent Labs  Lab 06/28/17 2039  BNP 704.9*     DDimer No results for input(s): DDIMER in  the last 168 hours.   Radiology    Dg Chest 1 View  Result Date: 06/29/2017 CLINICAL DATA:  Right-sided thoracentesis. EXAM: CHEST 1 VIEW COMPARISON:  CXR 06/28/2017 FINDINGS: No pneumothorax status post right-sided thoracentesis. Marked improvement in aeration of the right lung with marked decrease in right-sided pleural fluid. Residual atelectatic lung is seen about the right hilum and right lung base. The patient is slightly rotated and tilted on this view. There is cardiomegaly. Right IJ catheter tip is seen at the cavoatrial juncture. No acute osseous appearing abnormality. Osteoarthritis of both shoulders. IMPRESSION: 1. No pneumothorax status post right-sided thoracentesis. 2. Marked reduction of right-sided pleural fluid with only a scant amount seen along the periphery of the right lung. Electronically Signed   By: Ashley Royalty M.D.   On: 06/29/2017 17:18   Dg Chest 2 View  Result Date: 06/28/2017 CLINICAL DATA:  Pt has been being treated for pneumonia. Changing her antibiotics haven't worked yet. Very SOB at times. Pt takes HTN meds. Not  diabetic. Smoker from age [age]. Pt hasn't been able to get any better since getting sick a few weeks ago. EXAM: CHEST  2 VIEW COMPARISON:  CT chest 06/10/2016 FINDINGS: Right jugular central venous catheter with the tip projecting over the cavoatrial junction. Right lower lobe airspace disease with a lucent area which may reflect an area of cavitation. Small right pleural effusion. Left lung is clear. No pneumothorax. Stable cardiomediastinal silhouette. No acute osseous abnormality. IMPRESSION: 1. Right lower lobe airspace disease with a lucent area which may reflect cavitary pneumonia. Small right pleural effusion. Electronically Signed   By: Kathreen Devoid   On: 06/28/2017 19:52   Ct Chest W Contrast  Result Date: 06/28/2017 CLINICAL DATA:  Chronic pneumonia. Shortness of breath. History of breast cancer. EXAM: CT CHEST WITH CONTRAST TECHNIQUE: Multidetector CT imaging of the chest was performed during intravenous contrast administration. CONTRAST:  63mL ISOVUE-300 IOPAMIDOL (ISOVUE-300) INJECTION 61% COMPARISON:  Chest radiograph 06/28/2017, body CT 06/10/2016 FINDINGS: Cardiovascular: Mildly enlarged heart. Calcific atherosclerotic disease of the coronary arteries. No evidence of central pulmonary embolus. Normal caliber of the aorta. Mediastinum/Nodes: 2 borderline pathologic by CT criteria but rounded right pretracheal lymph nodes, the larger measuring 10 mm in short axis. Normal appearance of the trachea. Large hiatal hernia. Lungs/Pleura: There is a large right pleural effusion. There is a near complete collapse of the right middle lobe and complete collapse of the right lower lobe. There is a small left pleural effusion with minimal atelectasis in the left lung base. Upper Abdomen: No acute abnormality. Musculoskeletal: Posttraumatic versus congenital deformity of T8 vertebral body with bony fusion of the right posterior seventh and eighth ribs and left eighth costovertebral junction. Stable associated  kyphotic deformity. IMPRESSION: Large right pleural effusion. Near complete collapse of the right middle lobe and complete collapse of the right lower lobe. No endobronchial lesions are seen to account for that. Re-evaluation after resolution of the pleural effusion may be considered. Two borderline abnormal right pretracheal lymph nodes, both could be reactive or malignant. Small left pleural effusion with sub segmental atelectasis in the left lung base. Large hiatal hernia. Electronically Signed   By: Fidela Salisbury M.D.   On: 06/28/2017 23:14   US Thoracentesis Asp Pleural Space W/img Guide  Result Date: 06/29/2017 INDICATION: CHF, breast cancer, pneumonia, dyspnea, right pleural effusion. Request made for diagnostic and therapeutic right thoracentesis. EXAM: ULTRASOUND GUIDED DIAGNOSTIC AND THERAPEUTIC RIGHT THORACENTESIS MEDICATIONS: None. COMPLICATIONS: None immediate. PROCEDURE: An ultrasound  guided thoracentesis was thoroughly discussed with the patient and questions answered. The benefits, risks, alternatives and complications were also discussed. The patient understands and wishes to proceed with the procedure. Written consent was obtained. Ultrasound was performed to localize and mark an adequate pocket of fluid in the right chest. The area was then prepped and draped in the normal sterile fashion. 2% Lidocaine was used for local anesthesia. Under ultrasound guidance a Safe-T-Centesis catheter was introduced. Thoracentesis was performed. The catheter was removed and a dressing applied. FINDINGS: A total of approximately 1.2 liters of yellow fluid was removed. Samples were sent to the laboratory as requested by the clinical team. IMPRESSION: Successful ultrasound guided diagnostic and therapeutic right thoracentesis yielding 1.2 liters of pleural fluid. Read by: Rowe Robert, PA-C Electronically Signed   By: Jerilynn Mages.  Shick M.D.   On: 06/29/2017 16:55    Cardiac Studies   Echocardiogram  06/29/2017 Study Conclusions  - Left ventricle: The cavity size was normal. Wall thickness was   normal. Systolic function was severely reduced. The estimated   ejection fraction was in the range of 20% to 25%. Features are   consistent with a pseudonormal left ventricular filling pattern,   with concomitant abnormal relaxation and increased filling   pressure (grade 2 diastolic dysfunction). - Mitral valve: There was moderate regurgitation. - Pulmonary arteries: Systolic pressure was moderately increased.   PA peak pressure: 51 mm Hg (S).  Echocardiogram 11/02/2014  Study Conclusions  - Left ventricle: The cavity size was normal. Wall thickness was normal. Systolic function was normal. The estimated ejection fraction was in the range of 60% to 65%. Doppler parameters are consistent with abnormal left ventricular relaxation (grade 1 diastolic dysfunction). - Mitral valve: There was mild regurgitation.  Patient Profile     66 y.o. female with a hx of paraplegia-wheelchair bound, diastolic CHF, rheumatoid arthritis, breast cancer  in 2003 with recurrence in 2013 status post mastectomy and prior chemotherapy, anemia, large diaphragmatic hernia, gastroparesis, hypertension, and rheumatoid arthritiswho is being seen today for the evaluation of CHF. She presents with recurrent shortness of breath and has been treated for successive pneumonias without adequate response to antibiotics.  She then required supplemental oxygen about 2 weeks ago.   Assessment & Plan    Acute on chronic diastolic heart failure/new systolic heart failure -In setting of recent pneumonia with hypoxia. Treated with multiple antibiotics but progressively worsening on CXR. Now with normal WBCs, afebrile.  -BNP elevated at 704.9. Trop 0.03 -Echocardiogram done yesterday shows severely reduced LV systolic function with EF 20-25%, down from 60-65% in 10/2014. Also grade 2 DD and moderate MR. PA peak pressure 51  mmHg. -Pt is being diuresed with lasix 40 mg IV BID. Wt is unchanged. No documentation of urine output- pt is incontinent per nurse and had large occurrence this am.  -On carvedilol 6.25 mg daily, losartan 25 mg daily -CT scan showed multivessel coronary artery calcifications.Pt may have ischemic cause of new LV dysfunction.  -Further investigation of newly reduced OV function per Dr. Debara Pickett.  Large right pleural effusion -Possibly related to heart failure given her newly reduced systolic function. Also could be parapneumonic or malignant given her history of recurrent breast cancer.  -Thoracentesis was done yesterday yielding 1.2 liters of yellow fluid. Fluid studies pending -Follow up CXR showed marked reduction in right sided pleural fluid -Still has diminished lung sounds in R base, but pt reports breathing better.   Hypertension -On carvedilol and losartan per home dosing -BP soft  with 75/53 last night and 97/64 this am. Pt asymptomatic. -Monitor for hypotension especially with diuresis. May need to adjust meds.   For questions or updates, please contact [place] Please consult [email or web address] for contact info under Cardiology/STEMI.      Signed, Daune Perch, NP  06/30/2017, 10:19 AM

## 2017-06-30 NOTE — NC FL2 (Signed)
[place] LEVEL OF CARE SCREENING TOOL     IDENTIFICATION  Patient Name: [name] Birthdate: [birth year] Sex: female Admission Date (Current Location): 06/28/2017  [facility] and [place] Number:  Herbalist and Address:  [facility],  [place] [address], [place]      Provider Number: [id number]  Attending Physician Name and Address:  Kayleen Memos, DO  Relative Name and Phone Number:       Current Level of Care: Hospital Recommended Level of Care: [place] Prior Approval Number:    Date Approved/Denied:   PASRR Number:    Discharge Plan: SNF    Current Diagnoses: Patient Active Problem List   Diagnosis Date Noted  . Pleural effusion, right 06/29/2017  . Respiratory failure with hypoxia ([place]) 06/29/2017  . Pressure injury of skin 06/29/2017  . Acute on chronic respiratory failure with hypoxemia ([place]) 06/29/2017  . SOB (shortness of breath)   . Hypokalemia 09/21/2016  . HCAP (healthcare-associated pneumonia) 07/28/2016  . Fever in adult 07/28/2016  . Sepsis ([place]) 06/10/2016  . Decubitus ulcer of coccyx 02/04/2016  . Hyperglycemia 12/03/2015  . H/O bilateral mastectomy 07/25/2015  . Benign hypertensive heart disease without heart failure 07/15/2015  . Hiatal hernia 07/06/2015  . 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 History of breast cancer 08/21/2014  . Adynamic ileus ([place]) 07/22/2014  . Elevated liver enzymes 07/22/2014  . CHF (congestive heart failure) ([place]) 07/05/2014  . Gastroparesis 07/05/2014  . Microcytic anemia 01/05/2014  . Insomnia 07/14/2013  . Edema 05/01/2013  . Rheumatoid arthritis ([place]) 02/23/2013  . GERD (gastroesophageal reflux disease) 02/23/2013  . Essential hypertension 02/23/2013  . Neurogenic bladder 02/23/2013  . Paraplegia ([place]) 02/23/2013  . Depression 02/23/2013  . Allergic rhinitis 02/23/2013  . Constipation 02/23/2013  . Cancer of upper-inner  quadrant of female breast ([place]) 09/24/2011  . Breast cancer, left breast ([place]) 09/08/2011    Orientation RESPIRATION BLADDER Height & Weight        O2(2 liters) Incontinent Weight: 156 lb 1.4 oz (70.8 kg) Height:  5\' 5"  (165.1 cm)  BEHAVIORAL SYMPTOMS/MOOD NEUROLOGICAL BOWEL NUTRITION STATUS      Incontinent Diet(heart healthy)  AMBULATORY STATUS COMMUNICATION OF NEEDS Skin   Total Care Verbally Other (Comment)(stage 2 pressure injury- sacrum )                       Personal Care Assistance Level of Assistance  Total care           Functional Limitations Info             SPECIAL CARE FACTORS FREQUENCY                   Contractures      Additional Factors Info  Code Status, Allergies Code Status Info: full code  Allergies Info: NKA            Current Medications (06/30/2017):  This is the current hospital active medication list Current Facility-Administered Medications  Medication Dose Route Frequency Provider Last Rate Last Dose  . acetaminophen (TYLENOL) tablet 650 mg  650 mg Oral Q6H PRN Fuller Plan A, MD      . baclofen (LIORESAL) tablet 10 mg  10 mg Oral TID PRN Fuller Plan A, MD      . bisacodyl (DULCOLAX) suppository 10 mg  10 mg Rectal QODAY Smith, Rondell A, MD   10 mg at 06/29/17 1519  .  calcium carbonate (TUMS - dosed in mg elemental calcium) chewable tablet 200 mg of elemental calcium  1 tablet Oral TID PRN Arby Barrette A, NP   200 mg of elemental calcium at 06/29/17 2148  . carvedilol (COREG) tablet 6.25 mg  6.25 mg Oral Daily Tamala [name], Rondell A, MD   6.25 mg at 06/30/17 0905  . cyclobenzaprine (FLEXERIL) tablet 10 mg  10 mg Oral Daily PRN Fuller Plan A, MD      . diclofenac sodium (VOLTAREN) 1 % transdermal gel 4 g  4 g Topical QID Fuller Plan A, MD   4 g at 06/30/17 0906  . diphenhydrAMINE (BENADRYL) capsule 25 mg  25 mg Oral Q8H PRN Smith, Rondell A, MD      . enoxaparin (LOVENOX) injection 40 mg  40 mg Subcutaneous QHS  Smith, Rondell A, MD   40 mg at 06/29/17 2203  . escitalopram (LEXAPRO) tablet 15 mg  15 mg Oral Daily Fuller Plan A, MD   15 mg at 06/30/17 0903  . feeding supplement (PRO-STAT SUGAR FREE 64) liquid 30 mL  30 mL Oral BID BM Smith, Rondell A, MD   30 mL at 06/30/17 0904  . fluticasone (FLONASE) 50 MCG/ACT nasal spray 2 spray  2 spray Each Nare Daily PRN Smith, Rondell A, MD      . furosemide (LASIX) injection 40 mg  40 mg Intravenous BID Tamala [name], Rondell A, MD   40 mg at 06/30/17 0900  . HYDROcodone-acetaminophen (NORCO) 7.5-325 MG per tablet 1 tablet  1 tablet Oral Q6H PRN Norval Morton, MD   1 tablet at 06/29/17 2250  . hydroxychloroquine (PLAQUENIL) tablet 100 mg  100 mg Oral Daily Fuller Plan A, MD   100 mg at 06/30/17 0904  . lidocaine (LIDODERM) 5 % 1 patch  1 patch Transdermal Q24H Fuller Plan A, MD   1 patch at 06/30/17 0500  . linaclotide (LINZESS) capsule 290 mcg  290 mcg Oral Daily Fuller Plan A, MD   290 mcg at 06/30/17 0867  . losartan (COZAAR) tablet 25 mg  25 mg Oral Daily Fuller Plan A, MD   25 mg at 06/30/17 0903  . methocarbamol (ROBAXIN) tablet 750 mg  750 mg Oral BID Fuller Plan A, MD   750 mg at 06/30/17 0902  . metoCLOPramide (REGLAN) tablet 5 mg  5 mg Oral TID Fuller Plan A, MD   5 mg at 06/30/17 0910  . nystatin (MYCOSTATIN) 100000 UNIT/ML suspension 500,000 Units  5 mL Oral QID Norval Morton, MD   500,000 Units at 06/30/17 6195  . oxybutynin (DITROPAN) tablet 5 mg  5 mg Oral BID Fuller Plan A, MD   5 mg at 06/30/17 0907  . pantoprazole (PROTONIX) EC tablet 40 mg  40 mg Oral Daily Fuller Plan A, MD   40 mg at 06/30/17 0905  . senna-docusate (Senokot-S) tablet 2 tablet  2 tablet Oral QHS Norval Morton, MD   2 tablet at 06/29/17 2146     Discharge Medications: Please see discharge summary for a list of discharge medications.  Relevant Imaging Results:  Relevant Lab Results:   Additional Information  SS#: [id number]  Weston Anna, LCSW

## 2017-06-30 NOTE — Progress Notes (Signed)
Patient has bed at [facility] today if medically stable for discharge. CSW will need discharge summary if ready today 12/5.   Kingsley Spittle, [facility] Emergency Room Clinical Social Worker [phone]

## 2017-06-30 NOTE — Progress Notes (Signed)
   Pt scheduled for left and right heart cath for Friday 07/02/17 at around 12:00 with Dr. Irish Lack pending adequate renal function and improvement in orthopnea with pt being able to lie flat for cath.  Daune Perch, AGNP-C [facility] 06/30/2017  3:13 PM Pager: [phone]

## 2017-06-30 NOTE — Progress Notes (Signed)
CSW following for discharge needs back to [facility]. Patient is a long term care resident.   Kathrin Greathouse, Latanya Presser, MSW Clinical Social Worker  [phone] 06/30/2017  9:06 AM

## 2017-07-01 ENCOUNTER — Inpatient Hospital Stay (HOSPITAL_COMMUNITY): Payer: Medicare Other

## 2017-07-01 LAB — CBC
HCT: 42.7 % (ref 36.0–46.0)
Hemoglobin: 13.4 g/dL (ref 12.0–15.0)
MCH: 25.1 pg — ABNORMAL LOW (ref 26.0–34.0)
MCHC: 31.4 g/dL (ref 30.0–36.0)
MCV: 80.1 fL (ref 78.0–100.0)
Platelets: 228 10*3/uL (ref 150–400)
RBC: 5.33 MIL/uL — ABNORMAL HIGH (ref 3.87–5.11)
RDW: 16 % — ABNORMAL HIGH (ref 11.5–15.5)
WBC: 6.6 10*3/uL (ref 4.0–10.5)

## 2017-07-01 LAB — BASIC METABOLIC PANEL
Anion gap: 7 (ref 5–15)
BUN: 12 mg/dL (ref 6–20)
CO2: 29 mmol/L (ref 22–32)
Calcium: 8.7 mg/dL — ABNORMAL LOW (ref 8.9–10.3)
Chloride: 104 mmol/L (ref 101–111)
Creatinine, Ser: 0.53 mg/dL (ref 0.44–1.00)
GFR calc Af Amer: >60 mL/min (ref >60)
GFR calc non Af Amer: >60 mL/min (ref >60)
Glucose, Bld: 93 mg/dL (ref 65–99)
Potassium: 3.5 mmol/L (ref 3.5–5.1)
Sodium: 140 mmol/L (ref 135–145)

## 2017-07-01 LAB — LIPID PANEL
Cholesterol: 125 mg/dL (ref 0–200)
HDL: 32 mg/dL — ABNORMAL LOW (ref >40)
LDL Cholesterol: 70 mg/dL (ref 0–99)
Total CHOL/HDL Ratio: 3.9 RATIO
Triglycerides: 114 mg/dL (ref <150)
VLDL: 23 mg/dL (ref 0–40)

## 2017-07-01 LAB — PROTIME-INR
INR: 1.1
Prothrombin Time: 14.1 seconds (ref 11.4–15.2)

## 2017-07-01 LAB — BRAIN NATRIURETIC PEPTIDE: B Natriuretic Peptide: 296.5 pg/mL — ABNORMAL HIGH (ref 0.0–100.0)

## 2017-07-01 MED ORDER — SODIUM CHLORIDE 0.9% FLUSH
10.0000 mL | INTRAVENOUS | Status: DC | PRN
  Administered 2017-07-01: 20 mL
  Administered 2017-07-02: 10 mL
  Filled 2017-07-01: qty 40

## 2017-07-01 MED ORDER — ASPIRIN 81 MG PO CHEW
81.0000 mg | CHEWABLE_TABLET | ORAL | Status: AC
  Administered 2017-07-02: 81 mg via ORAL
  Filled 2017-07-01: qty 1

## 2017-07-01 MED ORDER — SODIUM CHLORIDE 0.9 % IV SOLN
INTRAVENOUS | Status: DC
  Administered 2017-07-01: 23:00:00 via INTRAVENOUS

## 2017-07-01 MED ORDER — SODIUM CHLORIDE 0.9 % IV SOLN: 250.0000 mL | INTRAVENOUS | Status: DC | PRN

## 2017-07-01 MED ORDER — SODIUM CHLORIDE 0.9% FLUSH: 3.0000 mL | INTRAVENOUS | Status: DC | PRN

## 2017-07-01 MED ORDER — SODIUM CHLORIDE 0.9% FLUSH
3.0000 mL | Freq: Two times a day (BID) | INTRAVENOUS | Status: DC
  Administered 2017-07-01: 3 mL via INTRAVENOUS

## 2017-07-01 MED ORDER — FUROSEMIDE 40 MG PO TABS
40.0000 mg | ORAL_TABLET | Freq: Every day | ORAL | Status: DC
  Administered 2017-07-01 – 2017-07-03 (×2): 40 mg via ORAL
  Filled 2017-07-01: qty 2
  Filled 2017-07-01: qty 1

## 2017-07-01 NOTE — Progress Notes (Signed)
Progress Note  Patient Name: [name] Date of Encounter: 07/01/2017  Primary Cardiologist: Dr. Percival Spanish  Subjective   Pt seen lying flat in bed. She denies dyspnea or chest pain except for soreness at the thoracentesis site.   Inpatient Medications    Scheduled Meds: . bisacodyl  10 mg Rectal QODAY  . carvedilol  6.25 mg Oral Daily  . diclofenac sodium  4 g Topical QID  . enoxaparin (LOVENOX) injection  40 mg Subcutaneous QHS  . escitalopram  15 mg Oral Daily  . feeding supplement (PRO-STAT SUGAR FREE 64)  30 mL Oral BID BM  . furosemide  40 mg Intravenous BID  . hydroxychloroquine  100 mg Oral Daily  . lidocaine  1 patch Transdermal Q24H  . linaclotide  290 mcg Oral Daily  . losartan  25 mg Oral Daily  . methocarbamol  750 mg Oral BID  . metoCLOPramide  5 mg Oral TID  . nystatin  5 mL Oral QID  . oxybutynin  5 mg Oral BID  . pantoprazole  40 mg Oral Daily  . senna-docusate  2 tablet Oral QHS   Continuous Infusions:  PRN Meds: acetaminophen, baclofen, calcium carbonate, cyclobenzaprine, diphenhydrAMINE, fluticasone, HYDROcodone-acetaminophen, sodium chloride flush   Vital Signs    Vitals:   06/30/17 0518 06/30/17 1344 06/30/17 2142 07/01/17 0330  BP: 97/64 (!) 124/42 106/73 96/68  Pulse: 82 81 78 88  Resp: 20 20 20 16   Temp: 98.6 F (37 C) 98.9 F (37.2 C) 97.8 F (36.6 C) 98.2 F (36.8 C)  TempSrc: Oral Oral Oral Oral  SpO2: 94% 95% 93% 90%  Weight:      Height:        Intake/Output Summary (Last 24 hours) at 07/01/2017 0737 Last data filed at 06/30/2017 2300 Gross per 24 hour  Intake 720 ml  Output -  Net 720 ml   Filed Weights   06/28/17 1841 06/30/17 0233  Weight: 156 lb (70.8 kg) 156 lb 1.4 oz (70.8 kg)    Telemetry    Sinus rhythm in the low 80's - Personally Reviewed  ECG    No new tracings - Personally Reviewed  Physical Exam   GEN: No acute distress.   Neck: No JVD Cardiac: RRR, no murmurs, rubs, or gallops.    Respiratory: Clear to auscultation bilaterally, diminished in the right base GI: Soft, nontender, non-distended  MS: trace edema left foot; Paraplegic Neuro:  Nonfocal  Psych: Normal affect   Labs    Chemistry Recent Labs  Lab 06/28/17 2039 06/30/17 0809 07/01/17 0513  NA 143 140 140  K 3.5 3.4* 3.5  CL 107 103 104  CO2 26 29 29   GLUCOSE 89 103* 93  BUN 10 11 12   CREATININE 0.49 0.52 0.53  CALCIUM 9.2 8.9 8.7*  PROT 7.6  --   --   ALBUMIN 3.3*  --   --   AST 31  --   --   ALT 42  --   --   ALKPHOS 63  --   --   BILITOT 0.5  --   --   GFRNONAA >60 >60 >60  GFRAA >60 >60 >60  ANIONGAP 10 8 7      Hematology Recent Labs  Lab 06/28/17 2039 06/30/17 0809 07/01/17 0513  WBC 7.0 7.2 6.6  RBC 5.63* 5.41* 5.33*  HGB 14.3 13.9 13.4  HCT 44.7 43.4 42.7  MCV 79.4 80.2 80.1  MCH 25.4* 25.7* 25.1*  MCHC 32.0 32.0 31.4  RDW 16.3* 16.2* 16.0*  PLT 207 225 228    Cardiac Enzymes Recent Labs  Lab 06/29/17 0436 06/29/17 0907  TROPONINI 0.03* 0.04*   No results for input(s): TROPIPOC in the last 168 hours.   BNP Recent Labs  Lab 06/28/17 2039 07/01/17 0513  BNP 704.9* 296.5*     DDimer No results for input(s): DDIMER in the last 168 hours.   Radiology    Dg Chest 1 View  Result Date: 06/29/2017 CLINICAL DATA:  Right-sided thoracentesis. EXAM: CHEST 1 VIEW COMPARISON:  CXR 06/28/2017 FINDINGS: No pneumothorax status post right-sided thoracentesis. Marked improvement in aeration of the right lung with marked decrease in right-sided pleural fluid. Residual atelectatic lung is seen about the right hilum and right lung base. The patient is slightly rotated and tilted on this view. There is cardiomegaly. Right IJ catheter tip is seen at the cavoatrial juncture. No acute osseous appearing abnormality. Osteoarthritis of both shoulders. IMPRESSION: 1. No pneumothorax status post right-sided thoracentesis. 2. Marked reduction of right-sided pleural fluid with only a scant  amount seen along the periphery of the right lung. Electronically Signed   By: Ashley Royalty M.D.   On: 06/29/2017 17:18   Dg Chest Port 1 View  Result Date: 06/30/2017 CLINICAL DATA:  Status post thoracentesis yesterday. History of CHF, breast malignancy, hypertension, lower extremity paraplegia. EXAM: PORTABLE CHEST 1 VIEW COMPARISON:  Chest x-ray of June 29, 2017 FINDINGS: The lungs are adequately inflated. There is a small right pleural effusion which has not appreciably changed since the previous study. No pneumothorax is observed. The left lung is clear. The cardiac silhouette is enlarged but stable. The pulmonary vascularity is normal. The right internal jugular venous catheter tip projects over the midportion of the SVC. IMPRESSION: No late pneumothorax on the right. Stable small right pleural effusion. Stable cardiomegaly without pulmonary edema. Electronically Signed   By: David  Martinique M.D.   On: 06/30/2017 10:43   US Thoracentesis Asp Pleural Space W/img Guide  Result Date: 06/29/2017 INDICATION: CHF, breast cancer, pneumonia, dyspnea, right pleural effusion. Request made for diagnostic and therapeutic right thoracentesis. EXAM: ULTRASOUND GUIDED DIAGNOSTIC AND THERAPEUTIC RIGHT THORACENTESIS MEDICATIONS: None. COMPLICATIONS: None immediate. PROCEDURE: An ultrasound guided thoracentesis was thoroughly discussed with the patient and questions answered. The benefits, risks, alternatives and complications were also discussed. The patient understands and wishes to proceed with the procedure. Written consent was obtained. Ultrasound was performed to localize and mark an adequate pocket of fluid in the right chest. The area was then prepped and draped in the normal sterile fashion. 2% Lidocaine was used for local anesthesia. Under ultrasound guidance a Safe-T-Centesis catheter was introduced. Thoracentesis was performed. The catheter was removed and a dressing applied. FINDINGS: A total of  approximately 1.2 liters of yellow fluid was removed. Samples were sent to the laboratory as requested by the clinical team. IMPRESSION: Successful ultrasound guided diagnostic and therapeutic right thoracentesis yielding 1.2 liters of pleural fluid. Read by: Rowe Robert, PA-C Electronically Signed   By: Jerilynn Mages.  Shick M.D.   On: 06/29/2017 16:55    Cardiac Studies   Echocardiogram 06/29/2017 Study Conclusions  - Left ventricle: The cavity size was normal. Wall thickness was normal. Systolic function was severely reduced. The estimated ejection fraction was in the range of 20% to 25%. Features are consistent with a pseudonormal left ventricular filling pattern, with concomitant abnormal relaxation and increased filling pressure (grade 2 diastolic dysfunction). - Mitral valve: There was moderate regurgitation. - Pulmonary arteries: Systolic  pressure was moderately increased. PA peak pressure: 51 mm Hg (S).  Echocardiogram 11/02/2014 Study Conclusions  - Left ventricle: The cavity size was normal. Wall thickness was normal. Systolic function was normal. The estimated ejection fraction was in the range of 60% to 65%. Doppler parameters are consistent with abnormal left ventricular relaxation (grade 1 diastolic dysfunction). - Mitral valve: There was mild regurgitation.   Patient Profile     66 y.o. female with a hx of paraplegia-wheelchair bound, diastolicCHF, rheumatoid arthritis, breast cancerin 2003 with recurrence in 2013 status post mastectomy and prior chemotherapy, anemia, large diaphragmatic hernia, gastroparesis, hypertension, and rheumatoid arthritiswho is being seen today for the evaluation of CHF. She presents with recurrent shortness of breath and has been treated for successive pneumonias without adequate response to antibiotics. She then required supplemental oxygen about 2 weeks ago. Now found to have large pleural effusion and newly reduced LV  function.   Assessment & Plan    Acute combined systolic and diastolic heart failure -Recent pneumonia with hypoxia. -Echo showed severely reduced LV systolic function with EF 20-25%, down from 60-65% in 10/2014. Also grade 2 DD and moderate MR. PA peak pressure 51 mmHg. -BNP on admission 704.9, down to 296.5 today.  -Troponins 0.03, 0.04 consistent with CHF -Diuresing with lasix 40 mg IV BID. I&O not accurate as pt is incontinent. Will switch to oral lasix. -On carvedilol 6.25 mg and losartan 25 mg daily -CT scan showed multivessel coronary artery calcifications.Pt may have ischemic cause of new LV dysfunction.  -Pt is breathing better. Plan for L&R heart cath tomorrow to further evaluate LV dysfunction.  -SCr stable at 0.53, K+ 3.5 -LDL 70. May need statin if CAD found on cath.   Large right pleural effusion -Likely related to heart failure -Thoracentesis done on 12/4 with removal of 1.2L fluid. Fluid studies pending -Follow up CXR showed marked reduction in right sided pleural fluid -Still  Has diminished air movement in right base. Will order IS.   Hypertension -On carvedilol and losartan per home dosing -BP is marginal but pt asymptomatic.  -Monitor of hypotension especially with diuresis.   For questions or updates, please contact [place] Please consult [email or web address] for contact info under Cardiology/STEMI.      Signed, Daune Perch, NP  07/01/2017, 7:37 AM

## 2017-07-01 NOTE — Progress Notes (Signed)
Modified Barium Swallow Progress Note  Patient Details  Name: [name] MRN: [id number] Date of Birth: 04-07-1951  Today's Date: 07/01/2017  Modified Barium Swallow completed.  Full report located under Chart Review in the Imaging Section.  Brief recommendations include the following:  Clinical Impression  Patient presents with only minimal decreased oral manipulation with mastication of cracker and oral transiting of barium tablet.  Due to discoordination, patient required several boluses of thin barium and finally had to extend head to aid oral transiting.  Advised she take pills with applesauce - start and follow with liquids- to compensate for difficulties if helpful.  No aspiration or penetration of any consistency tested noted.  Pharyngeal swallow is strong and timely with no residuals. Educated patient to findings/recommendations live using monitor.  barium tablet taken with thin appeared to lodge mid-esophagus without pt awareness, bolus of pudding cleared tablet into stomach but with delay.  Patient may benefit from dedicated esophagram given h/o esophageal strictures requiring dilatation= pt reports EGD approximately 15 years ago but she does deny current symptoms.  Of note, patient reports she frequently consumes her meals reclined in bed - advised her to upright as much as able and when at [employer] = eating out of bed in chair recommended.  Recommend follow up at next venue for dysphagia management.  Thanks for this referral.    Swallow Evaluation Recommendations   Recommended Consults: Consider esophageal assessment   SLP Diet Recommendations: Regular solids;Thin liquid   Liquid Administration via: Cup;Straw   Medication Administration: Whole meds with puree(start and follow with liquids)   Supervision: Patient able to self feed;Intermittent supervision to cue for compensatory strategies   Compensations: Slow rate;Small sips/bites   Postural Changes: Seated upright  at 90 degrees;Remain semi-upright after after feeds/meals (Comment)   Oral Care Recommendations: Oral care QID      Luanna Salk, MS [facility] SLP [phone]   Macario Golds 07/01/2017,1:04 PM

## 2017-07-01 NOTE — Progress Notes (Signed)
Received patient from 1W, VS obtained, denies c/o pain at this time, telemetry monitor applied, oriented to unit, call light placed in reach

## 2017-07-01 NOTE — Plan of Care (Signed)
  Activity: Risk for activity intolerance will decrease 19/01/5882 2549 - Not Applicable by Dorene Sorrow, RN   Nutrition: Adequate nutrition will be maintained 07/01/2017 1045 - Progressing by Dorene Sorrow, RN   Elimination: Will not experience complications related to bowel motility 07/01/2017 1045 - Progressing by Dorene Sorrow, RN

## 2017-07-01 NOTE — Progress Notes (Addendum)
PROGRESS NOTE  [name] GBT:[id number] DOB: April 01, 1951 DOA: 06/28/2017 PCP: Jodi Marble, MD  HPI/Recap of past 24 hours: [name] is a 66 y.o. female with medical history significant of diastolic CHF, paraplegia wheelchair-bound, breast cancer, and rheumatoid arthritis; who presented with complaints of worsening cough.  Patient reports being treated for pna previously with multiple rounds of antibiotics before presentation without improvement of her symptoms. Reports chronic hypoxia on 2L O2 supplementation continuously.  Cardiology following and recommends LHC due to LVEF. 20-25% (06/29/17) from 60-65% (11/02/2014).   No acute events overnight. Left and right heart cath scheduled for tomorrow 07/02/17 per cardiology. Npo after midnight.    Assessment/Plan: Active Problems:   Rheumatoid arthritis (HCC)   Essential hypertension   Paraplegia (HCC)   CHF (congestive heart failure) (HCC)   History of breast cancer   Pleural effusion on right   Respiratory failure with hypoxia (HCC)   SOB (shortness of breath)   Pressure injury of skin   Acute on chronic respiratory failure with hypoxemia (HCC)   Acute systolic (congestive) heart failure ([place])   Assessment/Plan  Newly diagnosed Heart failure with reduced EF 20-25% post TTE 06/29/17 LVEF 20-25% from 60-65% (11/02/14) -cardiology following -Cardio plans Left and right [facility] 07/02/17; -carvedilol, losartan, not on asa or statin -lipid panel pending;  Acute right pleural effusion: -large right-sided pleural effusion.   -Suspect secondary to CHF. -post right thoracentesis with 1.2L yellow fluid removed (71/0/62)  Diastolic CHF: Acute on chronic.  -last EF 60-65% with grade 1 diastolic dysfunction in 12/9483.  -BNP 704. -Strict intake and output, daily weights -Lasix 40 mg IV Bid -2D echocardiogram 06/29/17 revealed LVEF 20-25% with grade 2 diastolic dysfunction with moderately increased PA pressure 51  mmhg.  Acute on chronic respiratory failure most likely 2/2 to right pleural effusion.  -resolved saturating 94% RA. -previously on 2L continuously -Continuous pulse oximetry with nasal cannula oxygen to keep O2 saturation greater or equal to 92%  Essential hypertension -stable -Continue Coreg and losartan  Rheumatoid arthritis -stable -Continue Plaquenil  Paraplegia -stable -Continue baclofen  Hypokalemia, resolved -K+ 3.4 -repleted -BMP am  Sacrum stage 2 decubitus ulcer, poa -stable -frequent turn and wound care by nursing  Code Status: Full  Family Communication: No family members at bedside.  Disposition Plan: will stay another  Midnight to continue present management. Possible Left heart cath and right heart cath 07/02/17..   Consultants: cardiology  Procedures:  None  Antimicrobials:  None  DVT prophylaxis:  lovenox sq 40 mg daily   Objective: Vitals:   06/30/17 0518 06/30/17 1344 06/30/17 2142 07/01/17 0330  BP: 97/64 (!) 124/42 106/73 96/68  Pulse: 82 81 78 88  Resp: 20 20 20 16   Temp: 98.6 F (37 C) 98.9 F (37.2 C) 97.8 F (36.6 C) 98.2 F (36.8 C)  TempSrc: Oral Oral Oral Oral  SpO2: 94% 95% 93% 90%  Weight:    71.5 kg (157 lb 11.5 oz)  Height:        Intake/Output Summary (Last 24 hours) at 07/01/2017 0810 Last data filed at 06/30/2017 2300 Gross per 24 hour  Intake 720 ml  Output -  Net 720 ml   Filed Weights   06/28/17 1841 06/30/17 0233 07/01/17 0330  Weight: 70.8 kg (156 lb) 70.8 kg (156 lb 1.4 oz) 71.5 kg (157 lb 11.5 oz)    Exam:   General:  66 yo AAF WD WN NAD  Cardiovascular: RRR with no rubs or gallops  Respiratory: Rales at bases bilaterally  Abdomen: soft ND NT NBS x4  Musculoskeletal: No LE sensation or movement with contractures  Skin: sacrum stage 2 decubitus ulcers, poa  Psychiatry: Mood is appropriate for condition and setting.   Data Reviewed: CBC: Recent Labs  Lab 06/28/17 2039  06/30/17 0809 07/01/17 0513  WBC 7.0 7.2 6.6  NEUTROABS 3.8  --   --   HGB 14.3 13.9 13.4  HCT 44.7 43.4 42.7  MCV 79.4 80.2 80.1  PLT 207 225 150   Basic Metabolic Panel: Recent Labs  Lab 06/28/17 2039 06/30/17 0809 07/01/17 0513  NA 143 140 140  K 3.5 3.4* 3.5  CL 107 103 104  CO2 26 29 29   GLUCOSE 89 103* 93  BUN 10 11 12   CREATININE 0.49 0.52 0.53  CALCIUM 9.2 8.9 8.7*   GFR: Estimated Creatinine Clearance: 68.6 mL/min (by C-G formula based on SCr of 0.53 mg/dL). Liver Function Tests: Recent Labs  Lab 06/28/17 2039  AST 31  ALT 42  ALKPHOS 63  BILITOT 0.5  PROT 7.6  ALBUMIN 3.3*   No results for input(s): LIPASE, AMYLASE in the last 168 hours. No results for input(s): AMMONIA in the last 168 hours. Coagulation Profile: Recent Labs  Lab 07/01/17 0513  INR 1.10   Cardiac Enzymes: Recent Labs  Lab 06/29/17 0436 06/29/17 0907  TROPONINI 0.03* 0.04*   BNP (last 3 results) No results for input(s): PROBNP in the last 8760 hours. HbA1C: No results for input(s): HGBA1C in the last 72 hours. CBG: No results for input(s): GLUCAP in the last 168 hours. Lipid Profile: No results for input(s): CHOL, HDL, LDLCALC, TRIG, CHOLHDL, LDLDIRECT in the last 72 hours. Thyroid Function Tests: No results for input(s): TSH, T4TOTAL, FREET4, T3FREE, THYROIDAB in the last 72 hours. Anemia Panel: No results for input(s): VITAMINB12, FOLATE, FERRITIN, TIBC, IRON, RETICCTPCT in the last 72 hours. Urine analysis:    Component Value Date/Time   COLORURINE YELLOW 06/10/2016 2037   APPEARANCEUR CLOUDY (A) 06/10/2016 2037   LABSPEC 1.023 06/10/2016 2037   PHURINE 5.5 06/10/2016 2037   GLUCOSEU NEGATIVE 06/10/2016 2037   HGBUR NEGATIVE 06/10/2016 2037   BILIRUBINUR NEGATIVE 06/10/2016 2037   KETONESUR NEGATIVE 06/10/2016 2037   PROTEINUR NEGATIVE 06/10/2016 2037   UROBILINOGEN 1.0 09/30/2010 1854   NITRITE POSITIVE (A) 06/10/2016 2037   LEUKOCYTESUR LARGE (A) 06/10/2016  2037   Sepsis Labs: @LABRCNTIP (procalcitonin:4,lacticidven:4)  ) Recent Results (from the past 240 hour(s))  Blood culture (routine x 2)     Status: None (Preliminary result)   Collection Time: 06/28/17  8:08 PM  Result Value Ref Range Status   Specimen Description BLOOD RIGHT ANTECUBITAL  Final   Special Requests IN PEDIATRIC BOTTLE Blood Culture adequate volume  Final   Culture   Final    NO GROWTH 2 DAYS Performed at [facility], [place] [address] [place], [place] [address]    Report Status PENDING  Incomplete  Blood culture (routine x 2)     Status: None (Preliminary result)   Collection Time: 06/28/17  8:25 PM  Result Value Ref Range Status   Specimen Description BLOOD RIGHT PORTA CATH  Final   Special Requests   Final    BOTTLES DRAWN AEROBIC AND ANAEROBIC Blood Culture adequate volume   Culture   Final    NO GROWTH 2 DAYS Performed at [facility], [place] [address], [place], [place] [address]    Report Status PENDING  Incomplete  Culture, body fluid-bottle     Status: None (Preliminary result)   Collection Time: 06/29/17  4:33 PM  Result Value Ref Range Status   Specimen Description FLUID PLEURAL RIGHT  Final   Special Requests BOTTLES DRAWN AEROBIC AND ANAEROBIC  Final   Culture   Final    NO GROWTH < 24 HOURS Performed at [facility], [place] [address] [place], [place] [address]    Report Status PENDING  Incomplete  Gram stain     Status: None   Collection Time: 06/29/17  4:33 PM  Result Value Ref Range Status   Specimen Description FLUID PLEURAL RIGHT  Final   Special Requests NONE  Final   Gram Stain   Final    MODERATE WBC PRESENT,BOTH PMN AND MONONUCLEAR NO ORGANISMS SEEN Performed at [facility], [address], [place], [place] [address]    Report Status 06/29/2017 FINAL  Final  MRSA PCR Screening     Status: None   Collection Time: 06/29/17  6:21 PM  Result Value Ref Range Status   MRSA by PCR NEGATIVE NEGATIVE Final     Comment:        The GeneXpert MRSA Assay (FDA approved for NASAL specimens only), is one component of a comprehensive MRSA colonization surveillance program. It is not intended to diagnose MRSA infection nor to guide or monitor treatment for MRSA infections.       Studies: Dg Chest Port 1 View  Result Date: 06/30/2017 CLINICAL DATA:  Status post thoracentesis yesterday. History of CHF, breast malignancy, hypertension, lower extremity paraplegia. EXAM: PORTABLE CHEST 1 VIEW COMPARISON:  Chest x-ray of June 29, 2017 FINDINGS: The lungs are adequately inflated. There is a small right pleural effusion which has not appreciably changed since the previous study. No pneumothorax is observed. The left lung is clear. The cardiac silhouette is enlarged but stable. The pulmonary vascularity is normal. The right internal jugular venous catheter tip projects over the midportion of the SVC. IMPRESSION: No late pneumothorax on the right. Stable small right pleural effusion. Stable cardiomegaly without pulmonary edema. Electronically Signed   By: David  Martinique M.D.   On: 06/30/2017 10:43    Scheduled Meds: . bisacodyl  10 mg Rectal QODAY  . carvedilol  6.25 mg Oral Daily  . diclofenac sodium  4 g Topical QID  . enoxaparin (LOVENOX) injection  40 mg Subcutaneous QHS  . escitalopram  15 mg Oral Daily  . feeding supplement (PRO-STAT SUGAR FREE 64)  30 mL Oral BID BM  . furosemide  40 mg Intravenous BID  . hydroxychloroquine  100 mg Oral Daily  . lidocaine  1 patch Transdermal Q24H  . linaclotide  290 mcg Oral Daily  . losartan  25 mg Oral Daily  . methocarbamol  750 mg Oral BID  . metoCLOPramide  5 mg Oral TID  . nystatin  5 mL Oral QID  . oxybutynin  5 mg Oral BID  . pantoprazole  40 mg Oral Daily  . senna-docusate  2 tablet Oral QHS    Continuous Infusions:   LOS: 2 days     Kayleen Memos, MD [facility] Pager [phone]  If 7PM-7AM, please contact  night-coverage [email or web address] Password [email or web address] 07/01/2017, 8:10 AM

## 2017-07-02 ENCOUNTER — Ambulatory Visit (HOSPITAL_COMMUNITY): Payer: Medicare Other

## 2017-07-02 ENCOUNTER — Ambulatory Visit (HOSPITAL_COMMUNITY)
Admission: RE | Admit: 2017-07-02 | Payer: Medicare Other | Source: Ambulatory Visit | Admitting: Interventional Cardiology

## 2017-07-02 ENCOUNTER — Encounter (HOSPITAL_COMMUNITY)
Admission: EM | Disposition: A | Discharge status: Skilled Nursing Facility | Payer: Self-pay | Source: Home / Self Care | Attending: Internal Medicine

## 2017-07-02 DIAGNOSIS — Z9889 Other specified postprocedural states: Secondary | ICD-10-CM

## 2017-07-02 HISTORY — PX: RIGHT/LEFT HEART CATH AND CORONARY ANGIOGRAPHY: CATH118266

## 2017-07-02 LAB — BASIC METABOLIC PANEL
Anion gap: 9 (ref 5–15)
BUN: 11 mg/dL (ref 6–20)
CHLORIDE: 100 mmol/L — AB (ref 101–111)
CO2: 29 mmol/L (ref 22–32)
CREATININE: 0.54 mg/dL (ref 0.44–1.00)
Calcium: 8.7 mg/dL — ABNORMAL LOW (ref 8.9–10.3)
GFR calc Af Amer: >60 mL/min (ref >60)
GFR calc non Af Amer: >60 mL/min (ref >60)
GLUCOSE: 85 mg/dL (ref 65–99)
POTASSIUM: 3.6 mmol/L (ref 3.5–5.1)
Sodium: 138 mmol/L (ref 135–145)

## 2017-07-02 LAB — POCT I-STAT 3, VENOUS BLOOD GAS (G3P V)
Acid-Base Excess: 2 mmol/L (ref 0.0–2.0)
Bicarbonate: 27.3 mmol/L (ref 20.0–28.0)
O2 Saturation: 71 %
PH VEN: 7.395 (ref 7.250–7.430)
TCO2: 29 mmol/L (ref 22–32)
pCO2, Ven: 44.6 mmHg (ref 44.0–60.0)
pO2, Ven: 38 mmHg (ref 32.0–45.0)

## 2017-07-02 LAB — POCT I-STAT 3, ART BLOOD GAS (G3+)
ACID-BASE EXCESS: 1 mmol/L (ref 0.0–2.0)
Bicarbonate: 25.5 mmol/L (ref 20.0–28.0)
O2 Saturation: 99 %
TCO2: 27 mmol/L (ref 22–32)
pCO2 arterial: 38 mmHg (ref 32.0–48.0)
pH, Arterial: 7.435 (ref 7.350–7.450)
pO2, Arterial: 134 mmHg — ABNORMAL HIGH (ref 83.0–108.0)

## 2017-07-02 LAB — CBC
HEMATOCRIT: 41.4 % (ref 36.0–46.0)
HEMOGLOBIN: 13.2 g/dL (ref 12.0–15.0)
MCH: 25.3 pg — AB (ref 26.0–34.0)
MCHC: 31.9 g/dL (ref 30.0–36.0)
MCV: 79.3 fL (ref 78.0–100.0)
Platelets: 227 10*3/uL (ref 150–400)
RBC: 5.22 MIL/uL — AB (ref 3.87–5.11)
RDW: 15.9 % — ABNORMAL HIGH (ref 11.5–15.5)
WBC: 7.2 10*3/uL (ref 4.0–10.5)

## 2017-07-02 LAB — LIPID PANEL
CHOL/HDL RATIO: 3.9 ratio
CHOLESTEROL: 130 mg/dL (ref 0–200)
HDL: 33 mg/dL — ABNORMAL LOW (ref >40)
LDL CALC: 77 mg/dL (ref 0–99)
Triglycerides: 102 mg/dL (ref <150)
VLDL: 20 mg/dL (ref 0–40)

## 2017-07-02 SURGERY — RIGHT/LEFT HEART CATH AND CORONARY ANGIOGRAPHY
Anesthesia: LOCAL

## 2017-07-02 MED ORDER — IOPAMIDOL (ISOVUE-370) INJECTION 76%
INTRAVENOUS | Status: AC
  Filled 2017-07-02: qty 100

## 2017-07-02 MED ORDER — ENOXAPARIN SODIUM 40 MG/0.4ML ~~LOC~~ SOLN
40.0000 mg | SUBCUTANEOUS | Status: DC
  Administered 2017-07-03: 40 mg via SUBCUTANEOUS
  Filled 2017-07-02: qty 0.4

## 2017-07-02 MED ORDER — MIDAZOLAM HCL 2 MG/2ML IJ SOLN
INTRAMUSCULAR | Status: AC
  Filled 2017-07-02: qty 2

## 2017-07-02 MED ORDER — HEPARIN (PORCINE) IN NACL 2-0.9 UNIT/ML-% IJ SOLN
INTRAMUSCULAR | Status: AC
  Filled 2017-07-02: qty 500

## 2017-07-02 MED ORDER — SODIUM CHLORIDE 0.9% FLUSH: 3.0000 mL | INTRAVENOUS | Status: DC | PRN

## 2017-07-02 MED ORDER — SODIUM CHLORIDE 0.9% FLUSH
3.0000 mL | Freq: Two times a day (BID) | INTRAVENOUS | Status: DC
  Administered 2017-07-03: 3 mL via INTRAVENOUS

## 2017-07-02 MED ORDER — VERAPAMIL HCL 2.5 MG/ML IV SOLN
INTRAVENOUS | Status: DC | PRN
  Administered 2017-07-02: 10 mL via INTRA_ARTERIAL

## 2017-07-02 MED ORDER — LIDOCAINE HCL (PF) 1 % IJ SOLN
INTRAMUSCULAR | Status: AC
  Filled 2017-07-02: qty 30

## 2017-07-02 MED ORDER — ONDANSETRON HCL 4 MG/2ML IJ SOLN: 4.0000 mg | Freq: Four times a day (QID) | INTRAMUSCULAR | Status: DC | PRN

## 2017-07-02 MED ORDER — ACETAMINOPHEN 325 MG PO TABS
650.0000 mg | ORAL_TABLET | ORAL | Status: DC | PRN
  Administered 2017-07-02 – 2017-07-03 (×2): 650 mg via ORAL

## 2017-07-02 MED ORDER — MIDAZOLAM HCL 2 MG/2ML IJ SOLN
INTRAMUSCULAR | Status: DC | PRN
  Administered 2017-07-02 (×2): 0.5 mg via INTRAVENOUS

## 2017-07-02 MED ORDER — VERAPAMIL HCL 2.5 MG/ML IV SOLN
INTRAVENOUS | Status: AC
  Filled 2017-07-02: qty 2

## 2017-07-02 MED ORDER — LIDOCAINE HCL (PF) 1 % IJ SOLN
INTRAMUSCULAR | Status: DC | PRN
  Administered 2017-07-02: 2 mL

## 2017-07-02 MED ORDER — IOPAMIDOL (ISOVUE-370) INJECTION 76%
INTRAVENOUS | Status: DC | PRN
  Administered 2017-07-02: 75 mL via INTRA_ARTERIAL

## 2017-07-02 MED ORDER — HEPARIN SODIUM (PORCINE) 1000 UNIT/ML IJ SOLN
INTRAMUSCULAR | Status: DC | PRN
  Administered 2017-07-02: 3500 [IU] via INTRAVENOUS

## 2017-07-02 MED ORDER — SODIUM CHLORIDE 0.9 % IV SOLN: 250.0000 mL | INTRAVENOUS | Status: DC | PRN

## 2017-07-02 MED ORDER — HEPARIN (PORCINE) IN NACL 2-0.9 UNIT/ML-% IJ SOLN
INTRAMUSCULAR | Status: AC | PRN
  Administered 2017-07-02: 1500 mL

## 2017-07-02 MED ORDER — HEPARIN (PORCINE) IN NACL 2-0.9 UNIT/ML-% IJ SOLN
INTRAMUSCULAR | Status: AC
  Filled 2017-07-02: qty 1000

## 2017-07-02 MED ORDER — FENTANYL CITRATE (PF) 100 MCG/2ML IJ SOLN
INTRAMUSCULAR | Status: AC
  Filled 2017-07-02: qty 2

## 2017-07-02 MED ORDER — HEPARIN SODIUM (PORCINE) 1000 UNIT/ML IJ SOLN
INTRAMUSCULAR | Status: AC
  Filled 2017-07-02: qty 1

## 2017-07-02 MED ORDER — FENTANYL CITRATE (PF) 100 MCG/2ML IJ SOLN
INTRAMUSCULAR | Status: DC | PRN
  Administered 2017-07-02 (×2): 25 ug via INTRAVENOUS

## 2017-07-02 SURGICAL SUPPLY — 15 items
CATH 5FR JL3.5 JR4 ANG PIG MP (CATHETERS) ×2 IMPLANT
CATH BALLN WEDGE 5F 110CM (CATHETERS) ×2 IMPLANT
COVER PRB 48X5XTLSCP FOLD TPE (BAG) ×1 IMPLANT
COVER PROBE 5X48 (BAG) ×1
DEVICE RAD TR BAND REGULAR (VASCULAR PRODUCTS) ×2 IMPLANT
GLIDESHEATH SLEND SS 6F .021 (SHEATH) ×2 IMPLANT
GUIDEWIRE INQWIRE 1.5J.035X260 (WIRE) ×1 IMPLANT
HOVERMATT SINGLE USE (MISCELLANEOUS) ×2 IMPLANT
INQWIRE 1.5J .035X260CM (WIRE) ×2
KIT HEART LEFT (KITS) ×2 IMPLANT
PACK CARDIAC CATHETERIZATION (CUSTOM PROCEDURE TRAY) ×2 IMPLANT
SHEATH GLIDE SLENDER 4/5FR (SHEATH) ×2 IMPLANT
SYR MEDRAD MARK V 150ML (SYRINGE) ×2 IMPLANT
TRANSDUCER W/STOPCOCK (MISCELLANEOUS) ×2 IMPLANT
TUBING CIL FLEX 10 FLL-RA (TUBING) ×2 IMPLANT
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
## 2017-07-02 NOTE — Care Management Important Message (Signed)
Important Message  Patient Details  Name: [name] MRN: [id number] Date of Birth: 1950/12/25   Medicare Important Message Given:  Yes    Kerin Salen 07/02/2017, 10:29 AMImportant Message  Patient Details  Name: [name] MRN: [id number] Date of Birth: [birth year]   Medicare Important Message Given:  Yes    Kerin Salen 07/02/2017, 10:29 AM

## 2017-07-02 NOTE — Progress Notes (Signed)
TR BAND REMOVAL  LOCATION: right   radial  DEFLATED PER PROTOCOL:  yes   TIME BAND OFF / DRESSING APPLIED:   1815   SITE UPON ARRIVAL:    Level 0  SITE AFTER BAND REMOVAL:    Level 0  CIRCULATION SENSATION AND MOVEMENT:    Within Normal Limits : yes  COMMENTS:

## 2017-07-02 NOTE — Interval H&P Note (Signed)
History and Physical Interval Note:  07/02/2017 2:41 PM  [name]  has presented today for surgery, with the diagnosis of chf  The various methods of treatment have been discussed with the patient and family. After consideration of risks, benefits and other options for treatment, the patient has consented to  Procedure(s): RIGHT/LEFT HEART CATH AND CORONARY ANGIOGRAPHY (N/A) as a surgical intervention .  The patient's history has been reviewed, patient examined, no change in status, stable for surgery.  I have reviewed the patient's chart and labs.  Questions were answered to the patient's satisfaction.   Cath Lab Visit (complete for each Cath Lab visit)  Clinical Evaluation Leading to the Procedure:   ACS: No.  Non-ACS:    Anginal Classification: CCS II  Anti-ischemic medical therapy: Minimal Therapy (1 class of medications)  Non-Invasive Test Results: No non-invasive testing performed  Prior CABG: No previous CABG        Collier Salina [facility] 07/02/2017 2:41 PM

## 2017-07-02 NOTE — Progress Notes (Signed)
PROGRESS NOTE  [name] DGU:[id number] DOB: Oct 16, 1950 DOA: 06/28/2017 PCP: Minus Breeding, MD  HPI/Recap of past 24 hours: [name] is a 66 y.o. female with medical history significant of diastolic CHF, paraplegia wheelchair-bound, breast cancer, and rheumatoid arthritis; who presented with complaints of worsening cough.  Patient reports being treated for pna previously with multiple rounds of antibiotics before presentation without improvement of her symptoms. Reports chronic hypoxia on 2L O2 supplementation continuously. Chest xray on presentation revealed large right pleural effusion. Post right thoracentesis POD # 3 with 1.2L yellow fluid removed.  Cardiology following and recommends L and R HC due to LVEF. 20-25% (06/29/17) from 60-65% (11/02/2014).   No acute events overnight. Right heart cath scheduled today 07/02/17 per cardiology.  Pt was seen and examined at her bedside. She admits to mild pain when she takes a deep breath 3/10. Denies dyspnea and sat >95% on RA.    Assessment/Plan: Active Problems:   Rheumatoid arthritis (HCC)   Essential hypertension   Paraplegia (HCC)   CHF (congestive heart failure) (HCC)   History of breast cancer   Pleural effusion on right   Respiratory failure with hypoxia (HCC)   SOB (shortness of breath)   Pressure injury of skin   Acute on chronic respiratory failure with hypoxemia (HCC)   Acute systolic (congestive) heart failure ([place])   Assessment/Plan  Newly diagnosed Heart failure with reduced EF 20-25% post TTE 06/29/17 LVEF 20-25% from 60-65% (11/02/14) -cardiology following -Cardio plans right [facility] today 07/02/17; -carvedilol, losartan, not on asa or statin -lipid panel pending;  Acute right pleural effusion: -large right-sided pleural effusion.   -Suspect secondary to CHF. -post right thoracentesis with 1.2L yellow fluid removed (95/6/38)  Diastolic CHF: Acute on chronic.  -last EF 60-65% with grade 1 diastolic  dysfunction in 01/5642.  -BNP 704. -Strict intake and output, daily weights -Lasix 40 mg IV Bid -2D echocardiogram 06/29/17 revealed LVEF 20-25% with grade 2 diastolic dysfunction with moderately increased PA pressure 51 mmhg.  Acute on chronic respiratory failure most likely 2/2 to right pleural effusion.  -resolved saturating 94% RA. -previously on 2L continuously -Continuous pulse oximetry with nasal cannula oxygen to keep O2 saturation greater or equal to 92%  Essential hypertension -stable -Continue Coreg and losartan  Rheumatoid arthritis -stable -Continue Plaquenil  Paraplegia -stable -Continue baclofen  Hypokalemia, resolved -K+ 3.4 -repleted -BMP am  Sacrum stage 2 decubitus ulcer, poa -stable -frequent turn and wound care by nursing  Code Status: Full  Family Communication: No family members at bedside.  Disposition Plan: will stay another  Midnight to continue present management. Possible Left heart cath and right heart cath 07/02/17..   Consultants: cardiology  Procedures:  None  Antimicrobials:  None  DVT prophylaxis:  lovenox sq 40 mg daily   Objective: Vitals:   07/01/17 1714 07/01/17 2302 07/02/17 0409 07/02/17 1259  BP: 103/77 124/82 119/72 122/83  Pulse: 83 87 90 85  Resp: 16 16 15 18   Temp: 99.1 F (37.3 C) 98.6 F (37 C) 97.9 F (36.6 C) 98.3 F (36.8 C)  TempSrc: Oral Oral Oral Oral  SpO2: 96% 96% 94% 97%  Weight:   69.8 kg (153 lb 14.1 oz)   Height:        Intake/Output Summary (Last 24 hours) at 07/02/2017 1439 Last data filed at 07/02/2017 0600 Gross per 24 hour  Intake 79.67 ml  Output -  Net 79.67 ml   Filed Weights   06/30/17 0233 07/01/17 0330  07/02/17 0409  Weight: 70.8 kg (156 lb 1.4 oz) 71.5 kg (157 lb 11.5 oz) 69.8 kg (153 lb 14.1 oz)    Exam:   General:  66 yo AAF WD WN NAD  Cardiovascular: RRR with no rubs or gallops  Respiratory: Rales at bases bilaterally  Abdomen: soft ND NT NBS  x4  Musculoskeletal: No LE sensation or movement with contractures  Skin: sacrum stage 2 decubitus ulcers, poa  Psychiatry: Mood is appropriate for condition and setting.   Data Reviewed: CBC: Recent Labs  Lab 06/28/17 2039 06/30/17 0809 07/01/17 0513 07/02/17 0430  WBC 7.0 7.2 6.6 7.2  NEUTROABS 3.8  --   --   --   HGB 14.3 13.9 13.4 13.2  HCT 44.7 43.4 42.7 41.4  MCV 79.4 80.2 80.1 79.3  PLT 207 225 228 811   Basic Metabolic Panel: Recent Labs  Lab 06/28/17 2039 06/30/17 0809 07/01/17 0513 07/02/17 0430  NA 143 140 140 138  K 3.5 3.4* 3.5 3.6  CL 107 103 104 100*  CO2 26 29 29 29   GLUCOSE 89 103* 93 85  BUN 10 11 12 11   CREATININE 0.49 0.52 0.53 0.54  CALCIUM 9.2 8.9 8.7* 8.7*   GFR: Estimated Creatinine Clearance: 67.8 mL/min (by C-G formula based on SCr of 0.54 mg/dL). Liver Function Tests: Recent Labs  Lab 06/28/17 2039  AST 31  ALT 42  ALKPHOS 63  BILITOT 0.5  PROT 7.6  ALBUMIN 3.3*   No results for input(s): LIPASE, AMYLASE in the last 168 hours. No results for input(s): AMMONIA in the last 168 hours. Coagulation Profile: Recent Labs  Lab 07/01/17 0513  INR 1.10   Cardiac Enzymes: Recent Labs  Lab 06/29/17 0436 06/29/17 0907  TROPONINI 0.03* 0.04*   BNP (last 3 results) No results for input(s): PROBNP in the last 8760 hours. HbA1C: No results for input(s): HGBA1C in the last 72 hours. CBG: No results for input(s): GLUCAP in the last 168 hours. Lipid Profile: Recent Labs    07/01/17 0513 07/02/17 0430  CHOL 125 130  HDL 32* 33*  LDLCALC 70 77  TRIG 114 102  CHOLHDL 3.9 3.9   Thyroid Function Tests: No results for input(s): TSH, T4TOTAL, FREET4, T3FREE, THYROIDAB in the last 72 hours. Anemia Panel: No results for input(s): VITAMINB12, FOLATE, FERRITIN, TIBC, IRON, RETICCTPCT in the last 72 hours. Urine analysis:    Component Value Date/Time   COLORURINE YELLOW 06/10/2016 2037   APPEARANCEUR CLOUDY (A) 06/10/2016 2037    LABSPEC 1.023 06/10/2016 2037   PHURINE 5.5 06/10/2016 2037   GLUCOSEU NEGATIVE 06/10/2016 2037   HGBUR NEGATIVE 06/10/2016 2037   BILIRUBINUR NEGATIVE 06/10/2016 2037   KETONESUR NEGATIVE 06/10/2016 2037   PROTEINUR NEGATIVE 06/10/2016 2037   UROBILINOGEN 1.0 09/30/2010 1854   NITRITE POSITIVE (A) 06/10/2016 2037   LEUKOCYTESUR LARGE (A) 06/10/2016 2037   Sepsis Labs: @LABRCNTIP (procalcitonin:4,lacticidven:4)  ) Recent Results (from the past 240 hour(s))  Blood culture (routine x 2)     Status: None (Preliminary result)   Collection Time: 06/28/17  8:08 PM  Result Value Ref Range Status   Specimen Description BLOOD RIGHT ANTECUBITAL  Final   Special Requests IN PEDIATRIC BOTTLE Blood Culture adequate volume  Final   Culture   Final    NO GROWTH 4 DAYS Performed at [facility], [place] [address] [place], [place] [address]    Report Status PENDING  Incomplete  Blood culture (routine x 2)     Status:  None (Preliminary result)   Collection Time: 06/28/17  8:25 PM  Result Value Ref Range Status   Specimen Description BLOOD RIGHT PORTA CATH  Final   Special Requests   Final    BOTTLES DRAWN AEROBIC AND ANAEROBIC Blood Culture adequate volume   Culture   Final    NO GROWTH 4 DAYS Performed at [facility], [address], [place], [place] [address]    Report Status PENDING  Incomplete  Culture, body fluid-bottle     Status: None (Preliminary result)   Collection Time: 06/29/17  4:33 PM  Result Value Ref Range Status   Specimen Description FLUID PLEURAL RIGHT  Final   Special Requests BOTTLES DRAWN AEROBIC AND ANAEROBIC  Final   Culture   Final    NO GROWTH 3 DAYS Performed at [facility], [place] [address], [place], [place] [address]    Report Status PENDING  Incomplete  Gram stain     Status: None   Collection Time: 06/29/17  4:33 PM  Result Value Ref Range Status   Specimen Description FLUID PLEURAL RIGHT  Final   Special Requests NONE  Final   Gram  Stain   Final    MODERATE WBC PRESENT,BOTH PMN AND MONONUCLEAR NO ORGANISMS SEEN Performed at [facility], [address], [place], [place] [address]    Report Status 06/29/2017 FINAL  Final  MRSA PCR Screening     Status: None   Collection Time: 06/29/17  6:21 PM  Result Value Ref Range Status   MRSA by PCR NEGATIVE NEGATIVE Final    Comment:        The GeneXpert MRSA Assay (FDA approved for NASAL specimens only), is one component of a comprehensive MRSA colonization surveillance program. It is not intended to diagnose MRSA infection nor to guide or monitor treatment for MRSA infections.       Studies: No results found.  Scheduled Meds: . [MAR Hold] bisacodyl  10 mg Rectal QODAY  . [MAR Hold] carvedilol  6.25 mg Oral Daily  . [MAR Hold] diclofenac sodium  4 g Topical QID  . [MAR Hold] enoxaparin (LOVENOX) injection  40 mg Subcutaneous QHS  . [MAR Hold] escitalopram  15 mg Oral Daily  . [MAR Hold] feeding supplement (PRO-STAT SUGAR FREE 64)  30 mL Oral BID BM  . [MAR Hold] furosemide  40 mg Oral Daily  . [MAR Hold] hydroxychloroquine  100 mg Oral Daily  . [MAR Hold] lidocaine  1 patch Transdermal Q24H  . [MAR Hold] linaclotide  290 mcg Oral Daily  . [MAR Hold] losartan  25 mg Oral Daily  . [MAR Hold] methocarbamol  750 mg Oral BID  . [MAR Hold] metoCLOPramide  5 mg Oral TID  . [MAR Hold] nystatin  5 mL Oral QID  . [MAR Hold] oxybutynin  5 mg Oral BID  . [MAR Hold] pantoprazole  40 mg Oral Daily  . [MAR Hold] senna-docusate  2 tablet Oral QHS  . sodium chloride flush  3 mL Intravenous Q12H    Continuous Infusions: . sodium chloride    . sodium chloride 10 mL/hr at 07/01/17 2302     LOS: 3 days     Kayleen Memos, MD [facility] Pager [phone]  If 7PM-7AM, please contact night-coverage [email or web address] Password [facility] 07/02/2017, 2:39 PM

## 2017-07-02 NOTE — Progress Notes (Signed)
Progress Note  Patient Name: [name] Date of Encounter: 07/02/2017  Primary Cardiologist: Dr. Percival Spanish  Subjective   No chest pain, no SOB is sleeping flat.    Inpatient Medications    Scheduled Meds: . bisacodyl  10 mg Rectal QODAY  . carvedilol  6.25 mg Oral Daily  . diclofenac sodium  4 g Topical QID  . enoxaparin (LOVENOX) injection  40 mg Subcutaneous QHS  . escitalopram  15 mg Oral Daily  . feeding supplement (PRO-STAT SUGAR FREE 64)  30 mL Oral BID BM  . furosemide  40 mg Oral Daily  . hydroxychloroquine  100 mg Oral Daily  . lidocaine  1 patch Transdermal Q24H  . linaclotide  290 mcg Oral Daily  . losartan  25 mg Oral Daily  . methocarbamol  750 mg Oral BID  . metoCLOPramide  5 mg Oral TID  . nystatin  5 mL Oral QID  . oxybutynin  5 mg Oral BID  . pantoprazole  40 mg Oral Daily  . senna-docusate  2 tablet Oral QHS  . sodium chloride flush  3 mL Intravenous Q12H   Continuous Infusions: . sodium chloride    . sodium chloride 10 mL/hr at 07/01/17 2302   PRN Meds: sodium chloride, acetaminophen, baclofen, calcium carbonate, cyclobenzaprine, diphenhydrAMINE, fluticasone, HYDROcodone-acetaminophen, sodium chloride flush, sodium chloride flush   Vital Signs    Vitals:   07/01/17 1445 07/01/17 1714 07/01/17 2302 07/02/17 0409  BP: 96/68 103/77 124/82 119/72  Pulse: 81 83 87 90  Resp: 17 16 16 15   Temp: 98.3 F (36.8 C) 99.1 F (37.3 C) 98.6 F (37 C) 97.9 F (36.6 C)  TempSrc: Oral Oral Oral Oral  SpO2: 95% 96% 96% 94%  Weight:    153 lb 14.1 oz (69.8 kg)  Height:        Intake/Output Summary (Last 24 hours) at 07/02/2017 0852 Last data filed at 07/02/2017 0600 Gross per 24 hour  Intake 79.67 ml  Output -  Net 79.67 ml   Filed Weights   06/30/17 0233 07/01/17 0330 07/02/17 0409  Weight: 156 lb 1.4 oz (70.8 kg) 157 lb 11.5 oz (71.5 kg) 153 lb 14.1 oz (69.8 kg)    Telemetry    SR with rare couplet PVCs  - Personally Reviewed  ECG      No new - Personally Reviewed  Physical Exam   GEN: No acute distress.   Neck: No JVD Cardiac: RRR, no murmurs, rubs, or gallops.  Respiratory: Clear to auscultation bilaterally. GI: Soft, nontender, non-distended  MS: No edema; paraplegic with contracture of hips and knees  Neuro:  Nonfocal  Psych: Normal affect   Labs    Chemistry Recent Labs  Lab 06/28/17 2039 06/30/17 0809 07/01/17 0513 07/02/17 0430  NA 143 140 140 138  K 3.5 3.4* 3.5 3.6  CL 107 103 104 100*  CO2 26 29 29 29   GLUCOSE 89 103* 93 85  BUN 10 11 12 11   CREATININE 0.49 0.52 0.53 0.54  CALCIUM 9.2 8.9 8.7* 8.7*  PROT 7.6  --   --   --   ALBUMIN 3.3*  --   --   --   AST 31  --   --   --   ALT 42  --   --   --   ALKPHOS 63  --   --   --   BILITOT 0.5  --   --   --   GFRNONAA >60 >60 >  60 >60  GFRAA >60 >60 >60 >60  ANIONGAP 10 8 7 9      Hematology Recent Labs  Lab 06/30/17 0809 07/01/17 0513 07/02/17 0430  WBC 7.2 6.6 7.2  RBC 5.41* 5.33* 5.22*  HGB 13.9 13.4 13.2  HCT 43.4 42.7 41.4  MCV 80.2 80.1 79.3  MCH 25.7* 25.1* 25.3*  MCHC 32.0 31.4 31.9  RDW 16.2* 16.0* 15.9*  PLT 225 228 227    Cardiac Enzymes Recent Labs  Lab 06/29/17 0436 06/29/17 0907  TROPONINI 0.03* 0.04*   No results for input(s): TROPIPOC in the last 168 hours.   BNP Recent Labs  Lab 06/28/17 2039 07/01/17 0513  BNP 704.9* 296.5*     DDimer No results for input(s): DDIMER in the last 168 hours.   Radiology    Dg Chest Port 1 View  Result Date: 06/30/2017 CLINICAL DATA:  Status post thoracentesis yesterday. History of CHF, breast malignancy, hypertension, lower extremity paraplegia. EXAM: PORTABLE CHEST 1 VIEW COMPARISON:  Chest x-ray of June 29, 2017 FINDINGS: The lungs are adequately inflated. There is a small right pleural effusion which has not appreciably changed since the previous study. No pneumothorax is observed. The left lung is clear. The cardiac silhouette is enlarged but stable. The  pulmonary vascularity is normal. The right internal jugular venous catheter tip projects over the midportion of the SVC. IMPRESSION: No late pneumothorax on the right. Stable small right pleural effusion. Stable cardiomegaly without pulmonary edema. Electronically Signed   By: David  Martinique M.D.   On: 06/30/2017 10:43   Dg Swallowing Func-speech Pathology  Result Date: 07/01/2017 Objective Swallowing Evaluation: Type of Study: MBS-Modified Barium Swallow Study  Patient Details Name: [name] MRN: [id number] Date of Birth: February 26, 1951 Today's Date: 07/01/2017 Time: SLP Start Time (ACUTE ONLY): 1225 -SLP Stop Time (ACUTE ONLY): 0388 SLP Time Calculation (min) (ACUTE ONLY): 20 min Past Medical History: Past Medical History: Diagnosis Date . Anemia 10/12/2008 . CHF (congestive heart failure) ([name])   last Echo 2016? [facility] Diaphragmatic hernia without mention of obstruction or gangrene 08/29/2010 . Edema 05/20/2009 . Gastroparesis 10/05/2010 . GERD (gastroesophageal reflux disease) 02/02/2009 . Hypertension 10/12/2008 . Hypotension, unspecified 05/20/2009 . Immobility syndrome (paraplegic) 1977 . Iron deficiency anemia, unspecified 10/12/2008 . Leiomyoma of uterus  . Malignant neoplasm of breast (female), unspecified site 10/05/2010  2003, 2013  . Rheumatoid arthritis ([place]) 01/17/2009 Past Surgical History: Past Surgical History: Procedure Laterality Date . [place] . BREAST SURGERY  2003  Bilateral mastectomy . IR FLUORO GUIDE CV LINE RIGHT  06/23/2017 . IR US GUIDE VASC ACCESS RIGHT  06/23/2017 . PRESSURE ULCER DEBRIDEMENT  2009  on back . WOUND DEBRIDEMENT  09/02/2008  Large sacral back open wound HPI: 66 yo with h/o breast cancer s/p dilatations, admitted to [facility] with respiratory deficits.  Pt is s/p thoracentesis and plan is for cardiac cath Friday 07/02/2017.  Pt is rehabing at [facility] and was scheduled for MBS as OP, SNF SLP reached out to SLP and ordered for inpt MBS ordered.  Subjective: pt  awake in chair Assessment / Plan / Recommendation CHL IP CLINICAL IMPRESSIONS 07/01/2017 Clinical Impression Patient presents with only minimal decreased oral manipulation with mastication of cracker and oral transiting of barium tablet.  Due to discoordination, patient required several boluses of thin barium and finally had to extend head to aid oral transiting.  Advised she take pills with applesauce - start and follow with liquids- to compensate for difficulties  if helpful.  No aspiration or penetration of any consistency tested noted.  Pharyngeal swallow is strong and timely with no residuals. Educated patient to findings/recommendations live using monitor.  barium tablet taken with thin appeared to lodge mid-esophagus without pt awareness, bolus of pudding cleared tablet into stomach but with delay.  Patient may benefit from dedicated esophagram given h/o esophageal strictures requiring dilatation= pt reports EGD approximately 15 years ago but she does deny current symptoms.  Of note, patient reports she frequently consumes her meals reclined in bed - advised her to upright as much as able and when at [employer] = eating out of bed in chair recommended.  Recommend follow up at next venue for dysphagia management.  Thanks for this referral.  SLP Visit Diagnosis Dysphagia, oral phase (R13.11) Attention and concentration deficit following -- Frontal lobe and executive function deficit following -- Impact on safety and function Mild aspiration risk   CHL IP TREATMENT RECOMMENDATION 07/01/2017 Treatment Recommendations No treatment recommended at this time   Prognosis 07/01/2017 Prognosis for Safe Diet Advancement Guarded Barriers to Reach Goals -- Barriers/Prognosis Comment -- CHL IP DIET RECOMMENDATION 07/01/2017 SLP Diet Recommendations Regular solids;Thin liquid Liquid Administration via Cup;Straw Medication Administration Whole meds with puree Compensations Slow rate;Small sips/bites Postural Changes Seated  upright at 90 degrees;Remain semi-upright after after feeds/meals (Comment)   CHL IP OTHER RECOMMENDATIONS 07/01/2017 Recommended Consults Consider esophageal assessment Oral Care Recommendations Oral care QID Other Recommendations --   No flowsheet data found.  No flowsheet data found.     CHL IP ORAL PHASE 07/01/2017 Oral Phase Impaired Oral - Pudding Teaspoon -- Oral - Pudding Cup -- Oral - Honey Teaspoon -- Oral - Honey Cup -- Oral - Nectar Teaspoon -- Oral - Nectar Cup WFL Oral - Nectar Straw -- Oral - Thin Teaspoon WFL Oral - Thin Cup WFL Oral - Thin Straw WFL Oral - Puree WFL Oral - Mech Soft -- Oral - Regular Impaired mastication;Reduced posterior propulsion;Delayed oral transit;Lingual/palatal residue Oral - Multi-Consistency Reduced posterior propulsion;Weak lingual manipulation Oral - Pill Reduced posterior propulsion;Weak lingual manipulation;Other (Comment) Oral Phase - Comment --  CHL IP PHARYNGEAL PHASE 07/01/2017 Pharyngeal Phase WFL Pharyngeal- Pudding Teaspoon -- Pharyngeal -- Pharyngeal- Pudding Cup -- Pharyngeal -- Pharyngeal- Honey Teaspoon -- Pharyngeal -- Pharyngeal- Honey Cup -- Pharyngeal -- Pharyngeal- Nectar Teaspoon -- Pharyngeal -- Pharyngeal- Nectar Cup WFL Pharyngeal -- Pharyngeal- Nectar Straw -- Pharyngeal -- Pharyngeal- Thin Teaspoon WFL Pharyngeal -- Pharyngeal- Thin Cup WFL Pharyngeal -- Pharyngeal- Thin Straw WFL Pharyngeal -- Pharyngeal- Puree WFL Pharyngeal -- Pharyngeal- Mechanical Soft -- Pharyngeal -- Pharyngeal- Regular WFL Pharyngeal -- Pharyngeal- Multi-consistency WFL Pharyngeal -- Pharyngeal- Pill WFL Pharyngeal -- Pharyngeal Comment --  CHL IP CERVICAL ESOPHAGEAL PHASE 07/01/2017 Cervical Esophageal Phase Impaired Pudding Teaspoon -- Pudding Cup -- Honey Teaspoon -- Honey Cup -- Nectar Teaspoon -- Nectar Cup -- Nectar Straw -- Thin Teaspoon -- Thin Cup -- Thin Straw -- Puree -- Mechanical Soft -- Regular -- Multi-consistency -- Pill -- Cervical Esophageal Comment barium  tablet taken with thin appeared to lodge mid-esophagus without pt awareness, bolus of pudding cleared tablet into stomach but with delay; pt may benefit from dedicated esophagram given h/o esophageal strictures requiring dilatation= pt reports approximately 15 years ago but she does deny issues with sensing food or pills lodging in esophagus No flowsheet data found. Luanna Salk, MS [facility] SLP [phone]               Cardiac Studies   Echocardiogram  06/29/2017 Study Conclusions  - Left ventricle: The cavity size was normal. Wall thickness was normal. Systolic function was severely reduced. The estimated ejection fraction was in the range of 20% to 25%. Features are consistent with a pseudonormal left ventricular filling pattern, with concomitant abnormal relaxation and increased filling pressure (grade 2 diastolic dysfunction). - Mitral valve: There was moderate regurgitation. - Pulmonary arteries: Systolic pressure was moderately increased. PA peak pressure: 51 mm Hg (S).  Echocardiogram 11/02/2014 Study Conclusions  - Left ventricle: The cavity size was normal. Wall thickness was normal. Systolic function was normal. The estimated ejection fraction was in the range of 60% to 65%. Doppler parameters are consistent with abnormal left ventricular relaxation (grade 1 diastolic dysfunction). - Mitral valve: There was mild regurgitation.     Patient Profile     66 y.o. female with a hx of paraplegia-wheelchair bound, diastolicCHF, rheumatoid arthritis, breast cancerin 2003 with recurrence in 2013 status post mastectomy and prior chemotherapy, anemia, large diaphragmatic hernia, gastroparesis, hypertension, and rheumatoid arthritiswho is being seen today for the evaluation of CHF.She presents with recurrent shortness of breath and has been treated for successive pneumonias without adequate response to antibiotics. She then required supplemental oxygen about 2 weeks  ago.Now found to have large pleural effusion and newly reduced LV function.      Assessment & Plan    Acute combined systolic and diastolic HF --now with EF 20-25% and G2DD and moderate MR, PA pk pressure 51 mmhg --diuresed on lasix 40 mg IV BID now on oral lasix but no I &O wt down from 156 to 153 --on BB and ARB --for Rt heart cath today  CAD on CT of chest with multivessel CAD for Lt heart cath today as well.  LDL is 77  Mild increase of troponin 0.03 to 0.04 due to demand ischemia but cath will help determine CAD.  Large Rt pl effusion with thoracentesis on 12/4 removing 1.2 L fluid with follow up XR with small pl effusion  HTN controlled  Paraplegic with hip and knee flexion contractures, will need radial cath unable to do femoral and unsure if camera will fit with knees bent due to body habitus.  We can do rt heart cath.  Dr. Irish Lack aware.     For questions or updates, please contact [place] Please consult [email or web address] for contact info under Cardiology/STEMI.      Signed, Cecilie Kicks, NP  07/02/2017, 8:52 AM

## 2017-07-03 LAB — CULTURE, BLOOD (ROUTINE X 2)
Culture: NO GROWTH
Culture: NO GROWTH
SPECIAL REQUESTS: ADEQUATE
Special Requests: ADEQUATE

## 2017-07-03 MED ORDER — CARVEDILOL 3.125 MG PO TABS
3.1250 mg | ORAL_TABLET | Freq: Two times a day (BID) | ORAL | Status: DC
  Administered 2017-07-03 (×2): 3.125 mg via ORAL
  Filled 2017-07-03 (×2): qty 1

## 2017-07-03 MED ORDER — HEPARIN SOD (PORK) LOCK FLUSH 100 UNIT/ML IV SOLN: 250.0000 [IU] | INTRAVENOUS | Status: DC | PRN

## 2017-07-03 MED ORDER — CARVEDILOL 3.125 MG PO TABS: 3.1250 mg | ORAL_TABLET | Freq: Two times a day (BID) | ORAL | 0 refills | Status: DC

## 2017-07-03 NOTE — Clinical Social Work Note (Signed)
Clinical Social Work Assessment  Patient Details  Name: [name] MRN: [id number] Date of Birth: [birth year]  Date of referral:  06/30/17               Reason for consult:  Facility Placement                Permission sought to share information with:  Family Supports Permission granted to share information::  No(Patient's sister was in the room during CSW's visit with patient)  Name::     [name]  Agency::     Relationship::  Sister  Contact Information:  [phone]  Housing/Transportation Living arrangements for the past 2 months:  Skilled Nursing Facility(Patient from [facility]) Source of Information:  Patient Patient Interpreter Needed:  None Criminal Activity/Legal Involvement Pertinent to Current Situation/Hospitalization:  No - Comment as needed Significant Relationships:  Siblings Lives with:  Facility Resident([facility] ) Do you feel safe going back to the place where you live?  Yes Need for family participation in patient care:  Yes (Comment)  Care giving concerns:  Patient reported no concerns regarding her care at [facility] Worker assessment / plan:  CSW talked with patient at the bedside regarding her discharge today and to confirm her return to the skilled facility. Patient was sitting up in bed and had visitors, including her sister Pamala Hurry. Patient was alert, oriented, pleasant and willing to talk with CSW. Ms. Royer confirmed that she came from [facility] and was there when it was [employer], a total of 9 years. Ms. Wax advised CSW of her intent to return to the skilled facility.  Employment status:  Retired Nurse, adult PT Recommendations:  Not assessed at this time Information / Referral to community resources:  Other (Comment Required)(None needed or requested as patient from skilled nursing facility)  Patient/Family's Response to care:  Patient did not report any concerns regarding her care  during hospitalization.  Patient/Family's Understanding of and Emotional Response to Diagnosis, Current Treatment, and Prognosis:  Not discussed.  Emotional Assessment Appearance:  Appears stated age Attitude/Demeanor/Rapport:  Other(Appropriate) Affect (typically observed):  Appropriate, Pleasant Orientation:  Oriented to Self, Oriented to Place, Oriented to  Time, Oriented to Situation Alcohol / Substance use:  Tobacco Use, Alcohol Use, Illicit Drugs(Patient reported that she quit smoking and does not drink or use illicit drugs) Psych involvement (Current and /or in the community):  No (Comment)  Discharge Needs  Concerns to be addressed:  Discharge Planning Concerns Readmission within the last 30 days:  No Current discharge risk:  None Barriers to Discharge:  No Barriers Identified   Avriana, Joo, LCSW 07/03/2017, 3:05 PM

## 2017-07-03 NOTE — Progress Notes (Addendum)
Per Dr. Laurence Ferrari IR MD states "we do not pull tunneled catheters on the weekend, patient can have it done outpatient'.  Dr. Charlies Silvers aware.  Facility aware.  IV team aware that line is to be flushed and capped. Will continue with d/c

## 2017-07-03 NOTE — Progress Notes (Signed)
Report given to RN at [facility].  Patient ready for discharge pending removal of central line.

## 2017-07-03 NOTE — Discharge Instructions (Signed)
Losartan tablets What is this medicine? LOSARTAN (loe SAR tan) is used to treat high blood pressure and to reduce the risk of stroke in certain patients. This drug also slows the progression of kidney disease in patients with diabetes. This medicine may be used for other purposes; ask your health care provider or pharmacist if you have questions. COMMON BRAND NAME(S): Cozaar What should I tell my health care provider before I take this medicine? They need to know if you have any of these conditions: -heart failure -kidney or liver disease -an unusual or allergic reaction to losartan, other medicines, foods, dyes, or preservatives -pregnant or trying to get pregnant -breast-feeding How should I use this medicine? Take this medicine by mouth with a glass of water. Follow the directions on the prescription label. This medicine can be taken with or without food. Take your doses at regular intervals. Do not take your medicine more often than directed. Talk to your pediatrician regarding the use of this medicine in children. Special care may be needed. Overdosage: If you think you have taken too much of this medicine contact a poison control center or emergency room at once. NOTE: This medicine is only for you. Do not share this medicine with others. What if I miss a dose? If you miss a dose, take it as soon as you can. If it is almost time for your next dose, take only that dose. Do not take double or extra doses. What may interact with this medicine? -blood pressure medicines -diuretics, especially triamterene, spironolactone, or amiloride -fluconazole -NSAIDs, medicines for pain and inflammation, like ibuprofen or naproxen -potassium salts or potassium supplements -rifampin This list may not describe all possible interactions. Give your health care provider a list of all the medicines, herbs, non-prescription drugs, or dietary supplements you use. Also tell them if you smoke, drink alcohol, or  use illegal drugs. Some items may interact with your medicine. What should I watch for while using this medicine? Visit your doctor or health care professional for regular checks on your progress. Check your blood pressure as directed. Ask your doctor or health care professional what your blood pressure should be and when you should contact him or her. Call your doctor or health care professional if you notice an irregular or fast heart beat. Women should inform their doctor if they wish to become pregnant or think they might be pregnant. There is a potential for serious side effects to an unborn child, particularly in the second or third trimester. Talk to your health care professional or pharmacist for more information. You may get drowsy or dizzy. Do not drive, use machinery, or do anything that needs mental alertness until you know how this drug affects you. Do not stand or sit up quickly, especially if you are an older patient. This reduces the risk of dizzy or fainting spells. Alcohol can make you more drowsy and dizzy. Avoid alcoholic drinks. Avoid salt substitutes unless you are told otherwise by your doctor or health care professional. Do not treat yourself for coughs, colds, or pain while you are taking this medicine without asking your doctor or health care professional for advice. Some ingredients may increase your blood pressure. What side effects may I notice from receiving this medicine? Side effects that you should report to your doctor or health care professional as soon as possible: -confusion, dizziness, light headedness or fainting spells -decreased amount of urine passed -difficulty breathing or swallowing, hoarseness, or tightening of the throat -fast or  irregular heart beat, palpitations, or chest pain -skin rash, itching -swelling of your face, lips, tongue, hands, or feet Side effects that usually do not require medical attention (report to your doctor or health care  professional if they continue or are bothersome): -cough -decreased sexual function or desire -headache -nasal congestion or stuffiness -nausea or stomach pain -sore or cramping muscles This list may not describe all possible side effects. Call your doctor for medical advice about side effects. You may report side effects to FDA at 1-800-FDA-1088. Where should I keep my medicine? Keep out of the reach of children. Store at room temperature between 15 and 30 degrees C (59 and 86 degrees F). Protect from light. Keep container tightly closed. Throw away any unused medicine after the expiration date. NOTE: This sheet is a summary. It may not cover all possible information. If you have questions about this medicine, talk to your doctor, pharmacist, or health care provider.  2018 Elsevier/Gold Standard (2007-09-23 16:42:18) Carvedilol oral capsule, extended release What is this medicine? CARVEDILOL (KAR ve dil ol) is a beta-blocker. Beta-blockers reduce the workload on the heart and help it beat more regularly. This medicine is used to treat high blood pressure, heart failure, and heart problems after a heart attack. This medicine may be used for other purposes; ask your health care provider or pharmacist if you have questions. COMMON BRAND NAME(S): Coreg CR What should I tell my health care provider before I take this medicine? They need to know if you have any of these conditions: -circulation problems -diabetes -history of heart attack or heart disease -liver disease -lung or breathing disease, like asthma or emphysema -pheochromocytoma -slow or irregular heartbeat -thyroid disease -an unusual or allergic reaction to carvedilol, other beta-blockers, medicines, foods, dyes, or preservatives -pregnant or trying to get pregnant -breast-feeding How should I use this medicine? Take this medicine by mouth with a glass of water. Follow the directions on the prescription label. Take with food.  Do not cut, crush or chew this medicine. Take your doses at regular intervals. Do not take your medicine more often than directed. Do not stop taking except on your doctor's advice. Talk to your pediatrician regarding the use of this medicine in children. Special care may be needed. Overdosage: If you think you have taken too much of this medicine contact a poison control center or emergency room at once. NOTE: This medicine is only for you. Do not share this medicine with others. What if I miss a dose? If you miss a dose, take it as soon as you can. If it is almost time for your next dose, take only that dose. Do not take double or extra doses. What may interact with this medicine? This medicine may interact with the following medications: -certain medicines for blood pressure, heart disease, irregular heart beat -certain medicines for depression, like fluoxetine or paroxetine -certain medicines for diabetes, like glipizide or glyburide -cimetidine -clonidine -cyclosporine -digoxin -MAOIs like Carbex, Eldepryl, Marplan, Nardil, and Parnate -reserpine -rifampin This list may not describe all possible interactions. Give your health care provider a list of all the medicines, herbs, non-prescription drugs, or dietary supplements you use. Also tell them if you smoke, drink alcohol, or use illegal drugs. Some items may interact with your medicine. What should I watch for while using this medicine? Check your heart rate and blood pressure regularly while you are taking this medicine. Ask your doctor or health care professional what your heart rate and blood pressure should  be, and when you should contact him or her. Do not stop taking this medicine suddenly. This could lead to serious heart-related effects. Contact your doctor or health care professional if you have difficulty breathing while taking this drug. Check your weight daily. Ask your doctor or health care professional when you should notify  him/her of any weight gain. You may get drowsy or dizzy. Do not drive, use machinery, or do anything that requires mental alertness until you know how this medicine affects you. To reduce the risk of dizzy or fainting spells, do not sit or stand up quickly. Alcohol can make you more drowsy, and increase flushing and rapid heartbeats. Avoid alcoholic drinks. If you have diabetes, check your blood sugar as directed. Tell your doctor if you have changes in your blood sugar while you are taking this medicine. If you are going to have surgery, tell your doctor or health care professional that you are taking this medicine. What side effects may I notice from receiving this medicine? Side effects that you should report to your doctor or health care professional as soon as possible: -allergic reactions like skin rash, itching or hives, swelling of the face, lips, or tongue -breathing problems -dark urine -slow or irregular heartbeat -swollen legs or ankles -vomiting -yellowing of the eyes or skin Side effects that usually do not require medical attention (report to your doctor or health care professional if they continue or are bothersome): -change in sex drive or performance -diarrhea -dry eyes (especially if wearing contact lenses) -dry, itching skin -headache -nausea -unusually tired This list may not describe all possible side effects. Call your doctor for medical advice about side effects. You may report side effects to FDA at 1-800-FDA-1088. Where should I keep my medicine? Keep out of the reach of children. Store at room temperature between 15 and 30 degrees C (59 and 86 degrees F). Protect from moisture. Keep container tightly closed. Throw away any unused medicine after the expiration date. NOTE: This sheet is a summary. It may not cover all possible information. If you have questions about this medicine, talk to your doctor, pharmacist, or health care provider.  2018 Elsevier/Gold  Standard (2013-03-19 14:07:32)

## 2017-07-03 NOTE — Discharge Summary (Signed)
Physician Discharge Summary  [name] MPN:[id number] DOB: [birth year] DOA: 06/28/2017  PCP: Minus Breeding, MD  Admit date: 06/28/2017 Discharge date: 07/03/2017  Recommendations for Outpatient Follow-up:  1. Follow up with PCP in 1 week to make sure symptoms are controlled   Discharge Diagnoses:  Active Problems:   Rheumatoid arthritis ([place])   Essential hypertension   Paraplegia (HCC)   CHF (congestive heart failure) (HCC)   History of breast cancer   Pleural effusion   Pleural effusion on right   Respiratory failure with hypoxia (HCC)   SOB (shortness of breath)   Pressure injury of skin   Acute on chronic respiratory failure with hypoxemia (HCC)   Acute systolic (congestive) heart failure (HCC)   S/P thoracentesis   Status post thoracentesis   S/p percutaneous right heart catheterization    Discharge Condition: stable   Diet recommendation: as tolerated   History of present illness:  66 y.o.femalewith medical history significant ofdiastolic CHF, paraplegiawheelchair-bound,breast cancer, andrheumatoid arthritis; whopresented with complaints ofworsening cough. Patient reports being treated for pna previously with multiple rounds of antibiotics before presentation without improvement of her symptoms. Reports chronic hypoxia on 2L O2 supplementation continuously. Chest xray on presentation revealed large right pleural effusion. Post right thoracentesis POD # 3 with 1.2L yellow fluid removed.  Cardiology following and recommends L and R HC due to LVEF. 20-25% (06/29/17) from 60-65% (11/02/2014).   Hospital Course:   Acute right pleural effusion: -post right thoracentesis with 1.2L yellow fluid removed (06/29/17) - Stable resp status   Diastolic MGQ:QPYPP on chronic. -last EF 60-65% with grade 1 diastolic dysfunctionin 11/930. -BNP 704. -2D echocardiogram 06/29/17 revealed LVEF 20-25% with grade 2 diastolic dysfunction with moderately increased PA pressure  51 mmhg. - S/P cardiac cath showed no significant tCAD - Continue losartan and carvedilol   Acute on chronic respiratory failure most likely 2/2 to right pleural effusion.  - Stable respiratory status   Essential hypertension - Continue Coreg and Losartan   Rheumatoid arthritis - Continue Plaquenil   Paraplegia - Stable   Hypokalemia, resolved - Supplemented   Sacrum stage 2 decubitus ulcer, poa - Per RN care   Code Status: Full Family Communication: No family at the bedside    Consultants:  Cardiology   Procedures:  None  Antimicrobials:  None    Signed:  Leisa Lenz, MD  [facility] 07/03/2017, 1:44 PM  Pager #: [phone]  Time spent in minutes: more than 30 minutes  Discharge Exam: Vitals:   07/02/17 2122 07/03/17 0713  BP: (!) 111/94 90/69  Pulse: 83 85  Resp: 16 20  Temp: 98.2 F (36.8 C) 98.2 F (36.8 C)  SpO2: 97% 97%   Vitals:   07/02/17 1820 07/02/17 1835 07/02/17 2122 07/03/17 0713  BP: 100/70 (!) 114/93 (!) 111/94 90/69  Pulse: 77 85 83 85  Resp:   16 20  Temp:   98.2 F (36.8 C) 98.2 F (36.8 C)  TempSrc:   Oral Oral  SpO2: 98% 96% 97% 97%  Weight:      Height:        General: Pt is alert, follows commands appropriately, not in acute distress Cardiovascular: Regular rate and rhythm, S1/S2 + Respiratory: Clear to auscultation bilaterally, no wheezing, no crackles, no rhonchi Abdominal: Soft, non tender, non distended, bowel sounds +, no guarding Extremities: no cyanosis, pulses palpable bilaterally DP and PT Neuro: Grossly nonfocal  Discharge Instructions  Discharge Instructions    Call MD for:  persistant nausea and  vomiting   Complete by:  As directed    Call MD for:  severe uncontrolled pain   Complete by:  As directed    Diet - low sodium heart healthy   Complete by:  As directed    Increase activity slowly   Complete by:  As directed      Allergies as of 07/03/2017   No Known Allergies      Medication List    TAKE these medications   acetaminophen 325 MG tablet Commonly known as:  TYLENOL Take 650 mg by mouth every 6 (six) hours as needed.   baclofen 10 MG tablet Commonly known as:  LIORESAL Take 10 mg by mouth 3 (three) times daily as needed.   bisacodyl 10 MG suppository Commonly known as:  DULCOLAX Place 10 mg rectally every other day.   calcium carbonate 500 MG chewable tablet Commonly known as:  TUMS - dosed in mg elemental calcium Chew 2 tablets by mouth every 8 (eight) hours as needed for indigestion or heartburn.   carvedilol 3.125 MG tablet Commonly known as:  COREG Take 1 tablet (3.125 mg total) by mouth 2 (two) times daily with a meal. What changed:    medication strength  how much to take  when to take this  additional instructions   cholecalciferol 1000 units tablet Commonly known as:  VITAMIN D Take 1,000 Units by mouth daily.   Cranberry 425 MG Caps Take 425 mg by mouth daily.   cyclobenzaprine 10 MG tablet Commonly known as:  FLEXERIL Take 10 mg by mouth daily as needed for muscle spasms.   DECUBI-VITE Caps Take 1 capsule by mouth daily.   diclofenac sodium 1 % Gel Commonly known as:  VOLTAREN Apply 3 g topically 4 (four) times daily.   diphenhydrAMINE 25 MG tablet Commonly known as:  BENADRYL Take 25 mg by mouth every 8 (eight) hours as needed for itching.   escitalopram 5 MG tablet Commonly known as:  LEXAPRO Take 15 mg by mouth daily.   feeding supplement (PRO-STAT SUGAR FREE 64) Liqd Take 30 mLs by mouth. 30 cc BID PO QD   fluticasone 50 MCG/ACT nasal spray Commonly known as:  FLONASE Place 2 sprays into both nostrils daily as needed for allergies.   furosemide 20 MG tablet Commonly known as:  LASIX Take 20 mg by mouth daily.   HYDROcodone-acetaminophen 7.5-325 MG tablet Commonly known as:  NORCO Take 1 tablet by mouth every 6 (six) hours as needed for moderate pain.   hydrocortisone 2.5 % cream Apply 1  application topically 2 (two) times daily. To rash   hydroxychloroquine 200 MG tablet Commonly known as:  PLAQUENIL Take 100 mg by mouth daily.   LIDODERM EX Apply topically. Apply to right shoulder topically every 12 hours for pain. On in AM and off in PM   linaclotide 290 MCG Caps capsule Commonly known as:  LINZESS Take 1 capsule (290 mcg total) by mouth daily.   losartan 25 MG tablet Commonly known as:  COZAAR Take 25 mg by mouth daily.   methocarbamol 750 MG tablet Commonly known as:  ROBAXIN Take 750 mg by mouth 2 (two) times daily.   metoCLOPramide 5 MG tablet Commonly known as:  REGLAN Take 5 mg by mouth 3 (three) times daily.   NEUTROGENA OIL-FREE ACNE Collins EX Apply 1 application topically every 8 (eight) hours as needed (for acne on face).   nystatin 100000 UNIT/ML suspension Commonly known as:  MYCOSTATIN Take 5 mLs  by mouth 4 (four) times daily.   omeprazole 20 MG capsule Commonly known as:  PRILOSEC Take 20 mg by mouth daily.   oxybutynin 5 MG tablet Commonly known as:  DITROPAN Take 5 mg by mouth 2 (two) times daily.   OYSTER CALCIUM 500 + D 500-200 MG-UNIT tablet Generic drug:  calcium-vitamin D Take 1 tablet by mouth 2 (two) times daily.   PRENATAL 1 + IRON PO Take 1 tablet by mouth daily.   promethazine 25 MG tablet Commonly known as:  PHENERGAN Take 25 mg by mouth every 6 (six) hours as needed for nausea or vomiting.   sennosides-docusate sodium 8.6-50 MG tablet Commonly known as:  SENOKOT-S Take 2 tablets by mouth at bedtime.   ZYRTEC ALLERGY 10 MG tablet Generic drug:  cetirizine Take 10 mg by mouth daily as needed for allergies.      Follow-up Information    Minus Breeding, MD Follow up.   Specialty:  Cardiology Why:  office will contact you Contact information: [place] [address] [phone]            The results of significant diagnostics from this hospitalization (including imaging,  microbiology, ancillary and laboratory) are listed below for reference.    Significant Diagnostic Studies: Dg Chest 1 View  Result Date: 06/29/2017 CLINICAL DATA:  Right-sided thoracentesis. EXAM: CHEST 1 VIEW COMPARISON:  CXR 06/28/2017 FINDINGS: No pneumothorax status post right-sided thoracentesis. Marked improvement in aeration of the right lung with marked decrease in right-sided pleural fluid. Residual atelectatic lung is seen about the right hilum and right lung base. The patient is slightly rotated and tilted on this view. There is cardiomegaly. Right IJ catheter tip is seen at the cavoatrial juncture. No acute osseous appearing abnormality. Osteoarthritis of both shoulders. IMPRESSION: 1. No pneumothorax status post right-sided thoracentesis. 2. Marked reduction of right-sided pleural fluid with only a scant amount seen along the periphery of the right lung. Electronically Signed   By: Ashley Royalty M.D.   On: 06/29/2017 17:18   Dg Chest 2 View  Result Date: 06/28/2017 CLINICAL DATA:  Pt has been being treated for pneumonia. Changing her antibiotics haven't worked yet. Very SOB at times. Pt takes HTN meds. Not diabetic. Smoker from age [age]. Pt hasn't been able to get any better since getting sick a few weeks ago. EXAM: CHEST  2 VIEW COMPARISON:  CT chest 06/10/2016 FINDINGS: Right jugular central venous catheter with the tip projecting over the cavoatrial junction. Right lower lobe airspace disease with a lucent area which may reflect an area of cavitation. Small right pleural effusion. Left lung is clear. No pneumothorax. Stable cardiomediastinal silhouette. No acute osseous abnormality. IMPRESSION: 1. Right lower lobe airspace disease with a lucent area which may reflect cavitary pneumonia. Small right pleural effusion. Electronically Signed   By: Kathreen Devoid   On: 06/28/2017 19:52   Ct Chest W Contrast  Result Date: 06/28/2017 CLINICAL DATA:  Chronic pneumonia. Shortness of breath. History  of breast cancer. EXAM: CT CHEST WITH CONTRAST TECHNIQUE: Multidetector CT imaging of the chest was performed during intravenous contrast administration. CONTRAST:  74mL ISOVUE-300 IOPAMIDOL (ISOVUE-300) INJECTION 61% COMPARISON:  Chest radiograph 06/28/2017, body CT 06/10/2016 FINDINGS: Cardiovascular: Mildly enlarged heart. Calcific atherosclerotic disease of the coronary arteries. No evidence of central pulmonary embolus. Normal caliber of the aorta. Mediastinum/Nodes: 2 borderline pathologic by CT criteria but rounded right pretracheal lymph nodes, the larger measuring 10 mm in short axis. Normal appearance of the  trachea. Large hiatal hernia. Lungs/Pleura: There is a large right pleural effusion. There is a near complete collapse of the right middle lobe and complete collapse of the right lower lobe. There is a small left pleural effusion with minimal atelectasis in the left lung base. Upper Abdomen: No acute abnormality. Musculoskeletal: Posttraumatic versus congenital deformity of T8 vertebral body with bony fusion of the right posterior seventh and eighth ribs and left eighth costovertebral junction. Stable associated kyphotic deformity. IMPRESSION: Large right pleural effusion. Near complete collapse of the right middle lobe and complete collapse of the right lower lobe. No endobronchial lesions are seen to account for that. Re-evaluation after resolution of the pleural effusion may be considered. Two borderline abnormal right pretracheal lymph nodes, both could be reactive or malignant. Small left pleural effusion with sub segmental atelectasis in the left lung base. Large hiatal hernia. Electronically Signed   By: Fidela Salisbury M.D.   On: 06/28/2017 23:14   Ir Fluoro Guide Cv Line Right  Result Date: 06/23/2017 INDICATION: 66 year old with pneumonia and needs IV antibiotics. The patient had bilateral mastectomies and needs a tunneled central line. EXAM: FLUOROSCOPIC AND ULTRASOUND GUIDED  PLACEMENT OF A TUNNELED CENTRAL VENOUS CATHETER Physician: Stephan Minister. Henn, MD FLUOROSCOPY TIME:  16 seconds, 8.7 mGy MEDICATIONS: None ANESTHESIA/SEDATION: None PROCEDURE: Informed consent was obtained for placement of a tunneled central venous catheter. The patient was placed supine on the interventional table. Ultrasound confirmed a patent right internal jugularvein. Ultrasound images were obtained for documentation. The right side of the neck was prepped and draped in a sterile fashion. The right side of the neck was anesthetized with 1% lidocaine. Maximal barrier sterile technique was utilized including caps, mask, sterile gowns, sterile gloves, sterile drape, hand hygiene and skin antiseptic. A small incision was made with #11 blade scalpel. A 21 gauge needle directed into the right internal jugular vein with ultrasound guidance. A micropuncture dilator set was placed. A single lumen Powerline catheter was selected. The skin below the right clavicle was anesthetized and a small incision was made with an #11 blade scalpel. A subcutaneous tunnel was formed to the vein dermatotomy site. The catheter was brought through the tunnel. The vein dermatotomy site was dilated to accommodate a peel-away sheath. The catheter was placed through the peel-away sheath and directed into the central venous structures. The tip of the catheter was placed in the lower SVC with fluoroscopy. Fluoroscopic images were obtained for documentation. Lumen aspirated and flushed well. Heparinized saline placed in the lumen. The vein dermatotomy site was closed using Dermabond. The catheter was secured to the skin using Prolene suture. FINDINGS: Catheter tip in the lower SVC. COMPLICATIONS: None IMPRESSION: Successful placement of a right jugular tunneled central venous catheter using ultrasound and fluoroscopic guidance. Electronically Signed   By: Markus Daft M.D.   On: 06/23/2017 16:22   Ir US Guide Vasc Access Right  Result Date:  06/23/2017 INDICATION: 66 year old with pneumonia and needs IV antibiotics. The patient had bilateral mastectomies and needs a tunneled central line. EXAM: FLUOROSCOPIC AND ULTRASOUND GUIDED PLACEMENT OF A TUNNELED CENTRAL VENOUS CATHETER Physician: Stephan Minister. Henn, MD FLUOROSCOPY TIME:  16 seconds, 8.7 mGy MEDICATIONS: None ANESTHESIA/SEDATION: None PROCEDURE: Informed consent was obtained for placement of a tunneled central venous catheter. The patient was placed supine on the interventional table. Ultrasound confirmed a patent right internal jugularvein. Ultrasound images were obtained for documentation. The right side of the neck was prepped and draped in a sterile fashion. The right side  of the neck was anesthetized with 1% lidocaine. Maximal barrier sterile technique was utilized including caps, mask, sterile gowns, sterile gloves, sterile drape, hand hygiene and skin antiseptic. A small incision was made with #11 blade scalpel. A 21 gauge needle directed into the right internal jugular vein with ultrasound guidance. A micropuncture dilator set was placed. A single lumen Powerline catheter was selected. The skin below the right clavicle was anesthetized and a small incision was made with an #11 blade scalpel. A subcutaneous tunnel was formed to the vein dermatotomy site. The catheter was brought through the tunnel. The vein dermatotomy site was dilated to accommodate a peel-away sheath. The catheter was placed through the peel-away sheath and directed into the central venous structures. The tip of the catheter was placed in the lower SVC with fluoroscopy. Fluoroscopic images were obtained for documentation. Lumen aspirated and flushed well. Heparinized saline placed in the lumen. The vein dermatotomy site was closed using Dermabond. The catheter was secured to the skin using Prolene suture. FINDINGS: Catheter tip in the lower SVC. COMPLICATIONS: None IMPRESSION: Successful placement of a right jugular tunneled  central venous catheter using ultrasound and fluoroscopic guidance. Electronically Signed   By: Markus Daft M.D.   On: 06/23/2017 16:22   Dg Chest Port 1 View  Result Date: 06/30/2017 CLINICAL DATA:  Status post thoracentesis yesterday. History of CHF, breast malignancy, hypertension, lower extremity paraplegia. EXAM: PORTABLE CHEST 1 VIEW COMPARISON:  Chest x-ray of June 29, 2017 FINDINGS: The lungs are adequately inflated. There is a small right pleural effusion which has not appreciably changed since the previous study. No pneumothorax is observed. The left lung is clear. The cardiac silhouette is enlarged but stable. The pulmonary vascularity is normal. The right internal jugular venous catheter tip projects over the midportion of the SVC. IMPRESSION: No late pneumothorax on the right. Stable small right pleural effusion. Stable cardiomegaly without pulmonary edema. Electronically Signed   By: David  Martinique M.D.   On: 06/30/2017 10:43   Dg Swallowing Func-speech Pathology  Result Date: 07/01/2017 Objective Swallowing Evaluation: Type of Study: MBS-Modified Barium Swallow Study  Patient Details Name: YEMAYA BARNIER MRN: [id number] Date of Birth: [birth year] Today's Date: 07/01/2017 Time: SLP Start Time (ACUTE ONLY): 1225 -SLP Stop Time (ACUTE ONLY): 0254 SLP Time Calculation (min) (ACUTE ONLY): 20 min Past Medical History: Past Medical History: Diagnosis Date . Anemia 10/12/2008 . CHF (congestive heart failure) ([place])   last Echo 2016? [facility] Kitchen Diaphragmatic hernia without mention of obstruction or gangrene 08/29/2010 . Edema 05/20/2009 . Gastroparesis 10/05/2010 . GERD (gastroesophageal reflux disease) 02/02/2009 . Hypertension 10/12/2008 . Hypotension, unspecified 05/20/2009 . Immobility syndrome (paraplegic) 1977 . Iron deficiency anemia, unspecified 10/12/2008 . Leiomyoma of uterus  . Malignant neoplasm of breast (female), unspecified site 10/05/2010  2003, 2013  . Rheumatoid arthritis ([place]) 01/17/2009 Past  Surgical History: Past Surgical History: Procedure Laterality Date . [place] . BREAST SURGERY  2003  Bilateral mastectomy . IR FLUORO GUIDE CV LINE RIGHT  06/23/2017 . IR US GUIDE VASC ACCESS RIGHT  06/23/2017 . PRESSURE ULCER DEBRIDEMENT  2009  on back . WOUND DEBRIDEMENT  09/02/2008  Large sacral back open wound HPI: 66 yo with h/o breast cancer s/p dilatations, admitted to [facility] with respiratory deficits.  Pt is s/p thoracentesis and plan is for cardiac cath Friday 07/02/2017.  Pt is rehabing at [facility] and was scheduled for MBS as OP, SNF SLP reached out to SLP and ordered for inpt  MBS ordered.  Subjective: pt awake in chair Assessment / Plan / Recommendation CHL IP CLINICAL IMPRESSIONS 07/01/2017 Clinical Impression Patient presents with only minimal decreased oral manipulation with mastication of cracker and oral transiting of barium tablet.  Due to discoordination, patient required several boluses of thin barium and finally had to extend head to aid oral transiting.  Advised she take pills with applesauce - start and follow with liquids- to compensate for difficulties if helpful.  No aspiration or penetration of any consistency tested noted.  Pharyngeal swallow is strong and timely with no residuals. Educated patient to findings/recommendations live using monitor.  barium tablet taken with thin appeared to lodge mid-esophagus without pt awareness, bolus of pudding cleared tablet into stomach but with delay.  Patient may benefit from dedicated esophagram given h/o esophageal strictures requiring dilatation= pt reports EGD approximately 15 years ago but she does deny current symptoms.  Of note, patient reports she frequently consumes her meals reclined in bed - advised her to upright as much as able and when at [employer] = eating out of bed in chair recommended.  Recommend follow up at next venue for dysphagia management.  Thanks for this referral.  SLP Visit Diagnosis Dysphagia, oral phase  (R13.11) Attention and concentration deficit following -- Frontal lobe and executive function deficit following -- Impact on safety and function Mild aspiration risk   CHL IP TREATMENT RECOMMENDATION 07/01/2017 Treatment Recommendations No treatment recommended at this time   Prognosis 07/01/2017 Prognosis for Safe Diet Advancement Guarded Barriers to Reach Goals -- Barriers/Prognosis Comment -- CHL IP DIET RECOMMENDATION 07/01/2017 SLP Diet Recommendations Regular solids;Thin liquid Liquid Administration via Cup;Straw Medication Administration Whole meds with puree Compensations Slow rate;Small sips/bites Postural Changes Seated upright at 90 degrees;Remain semi-upright after after feeds/meals (Comment)   CHL IP OTHER RECOMMENDATIONS 07/01/2017 Recommended Consults Consider esophageal assessment Oral Care Recommendations Oral care QID Other Recommendations --   No flowsheet data found.  No flowsheet data found.     CHL IP ORAL PHASE 07/01/2017 Oral Phase Impaired Oral - Pudding Teaspoon -- Oral - Pudding Cup -- Oral - Honey Teaspoon -- Oral - Honey Cup -- Oral - Nectar Teaspoon -- Oral - Nectar Cup WFL Oral - Nectar Straw -- Oral - Thin Teaspoon WFL Oral - Thin Cup WFL Oral - Thin Straw WFL Oral - Puree WFL Oral - Mech Soft -- Oral - Regular Impaired mastication;Reduced posterior propulsion;Delayed oral transit;Lingual/palatal residue Oral - Multi-Consistency Reduced posterior propulsion;Weak lingual manipulation Oral - Pill Reduced posterior propulsion;Weak lingual manipulation;Other (Comment) Oral Phase - Comment --  CHL IP PHARYNGEAL PHASE 07/01/2017 Pharyngeal Phase WFL Pharyngeal- Pudding Teaspoon -- Pharyngeal -- Pharyngeal- Pudding Cup -- Pharyngeal -- Pharyngeal- Honey Teaspoon -- Pharyngeal -- Pharyngeal- Honey Cup -- Pharyngeal -- Pharyngeal- Nectar Teaspoon -- Pharyngeal -- Pharyngeal- Nectar Cup WFL Pharyngeal -- Pharyngeal- Nectar Straw -- Pharyngeal -- Pharyngeal- Thin Teaspoon WFL Pharyngeal -- Pharyngeal-  Thin Cup WFL Pharyngeal -- Pharyngeal- Thin Straw WFL Pharyngeal -- Pharyngeal- Puree WFL Pharyngeal -- Pharyngeal- Mechanical Soft -- Pharyngeal -- Pharyngeal- Regular WFL Pharyngeal -- Pharyngeal- Multi-consistency WFL Pharyngeal -- Pharyngeal- Pill WFL Pharyngeal -- Pharyngeal Comment --  CHL IP CERVICAL ESOPHAGEAL PHASE 07/01/2017 Cervical Esophageal Phase Impaired Pudding Teaspoon -- Pudding Cup -- Honey Teaspoon -- Honey Cup -- Nectar Teaspoon -- Nectar Cup -- Nectar Straw -- Thin Teaspoon -- Thin Cup -- Thin Straw -- Puree -- Mechanical Soft -- Regular -- Multi-consistency -- Pill -- Cervical Esophageal Comment barium tablet taken with thin appeared  to lodge mid-esophagus without pt awareness, bolus of pudding cleared tablet into stomach but with delay; pt may benefit from dedicated esophagram given h/o esophageal strictures requiring dilatation= pt reports approximately 15 years ago but she does deny issues with sensing food or pills lodging in esophagus No flowsheet data found. Luanna Salk, MS [facility] SLP [phone]              US Thoracentesis Asp Pleural Space W/img Guide  Result Date: 06/29/2017 INDICATION: CHF, breast cancer, pneumonia, dyspnea, right pleural effusion. Request made for diagnostic and therapeutic right thoracentesis. EXAM: ULTRASOUND GUIDED DIAGNOSTIC AND THERAPEUTIC RIGHT THORACENTESIS MEDICATIONS: None. COMPLICATIONS: None immediate. PROCEDURE: An ultrasound guided thoracentesis was thoroughly discussed with the patient and questions answered. The benefits, risks, alternatives and complications were also discussed. The patient understands and wishes to proceed with the procedure. Written consent was obtained. Ultrasound was performed to localize and mark an adequate pocket of fluid in the right chest. The area was then prepped and draped in the normal sterile fashion. 2% Lidocaine was used for local anesthesia. Under ultrasound guidance a Safe-T-Centesis catheter was introduced.  Thoracentesis was performed. The catheter was removed and a dressing applied. FINDINGS: A total of approximately 1.2 liters of yellow fluid was removed. Samples were sent to the laboratory as requested by the clinical team. IMPRESSION: Successful ultrasound guided diagnostic and therapeutic right thoracentesis yielding 1.2 liters of pleural fluid. Read by: Rowe Robert, PA-C Electronically Signed   By: Jerilynn Mages.  Shick M.D.   On: 06/29/2017 16:55    Microbiology: Recent Results (from the past 240 hour(s))  Blood culture (routine x 2)     Status: None   Collection Time: 06/28/17  8:08 PM  Result Value Ref Range Status   Specimen Description BLOOD RIGHT ANTECUBITAL  Final   Special Requests IN PEDIATRIC BOTTLE Blood Culture adequate volume  Final   Culture   Final    NO GROWTH 5 DAYS Performed at [facility], [address], [place], [place] [address]    Report Status 07/03/2017 FINAL  Final  Blood culture (routine x 2)     Status: None   Collection Time: 06/28/17  8:25 PM  Result Value Ref Range Status   Specimen Description BLOOD RIGHT PORTA CATH  Final   Special Requests   Final    BOTTLES DRAWN AEROBIC AND ANAEROBIC Blood Culture adequate volume   Culture   Final    NO GROWTH 5 DAYS Performed at [facility], [place] [address], [place], [place] [address]    Report Status 07/03/2017 FINAL  Final  Culture, body fluid-bottle     Status: None (Preliminary result)   Collection Time: 06/29/17  4:33 PM  Result Value Ref Range Status   Specimen Description FLUID PLEURAL RIGHT  Final   Special Requests BOTTLES DRAWN AEROBIC AND ANAEROBIC  Final   Culture   Final    NO GROWTH 4 DAYS Performed at [facility], [place] [address] [place], [place] [address]    Report Status PENDING  Incomplete  Gram stain     Status: None   Collection Time: 06/29/17  4:33 PM  Result Value Ref Range Status   Specimen Description FLUID PLEURAL RIGHT  Final   Special Requests NONE  Final   Gram Stain    Final    MODERATE WBC PRESENT,BOTH PMN AND MONONUCLEAR NO ORGANISMS SEEN Performed at [facility], [address] [place], [place] [address]  Report Status 06/29/2017 FINAL  Final  MRSA PCR Screening     Status: None   Collection Time: 06/29/17  6:21 PM  Result Value Ref Range Status   MRSA by PCR NEGATIVE NEGATIVE Final    Comment:        The GeneXpert MRSA Assay (FDA approved for NASAL specimens only), is one component of a comprehensive MRSA colonization surveillance program. It is not intended to diagnose MRSA infection nor to guide or monitor treatment for MRSA infections.      Labs: Basic Metabolic Panel: Recent Labs  Lab 06/28/17 2039 06/30/17 0809 07/01/17 0513 07/02/17 0430  NA 143 140 140 138  K 3.5 3.4* 3.5 3.6  CL 107 103 104 100*  CO2 26 29 29 29   GLUCOSE 89 103* 93 85  BUN 10 11 12 11   CREATININE 0.49 0.52 0.53 0.54  CALCIUM 9.2 8.9 8.7* 8.7*   Liver Function Tests: Recent Labs  Lab 06/28/17 2039  AST 31  ALT 42  ALKPHOS 63  BILITOT 0.5  PROT 7.6  ALBUMIN 3.3*   No results for input(s): LIPASE, AMYLASE in the last 168 hours. No results for input(s): AMMONIA in the last 168 hours. CBC: Recent Labs  Lab 06/28/17 2039 06/30/17 0809 07/01/17 0513 07/02/17 0430  WBC 7.0 7.2 6.6 7.2  NEUTROABS 3.8  --   --   --   HGB 14.3 13.9 13.4 13.2  HCT 44.7 43.4 42.7 41.4  MCV 79.4 80.2 80.1 79.3  PLT 207 225 228 227   Cardiac Enzymes: Recent Labs  Lab 06/29/17 0436 06/29/17 0907  TROPONINI 0.03* 0.04*   BNP: BNP (last 3 results) Recent Labs    06/28/17 2039 07/01/17 0513  BNP 704.9* 296.5*    ProBNP (last 3 results) No results for input(s): PROBNP in the last 8760 hours.  CBG: No results for input(s): GLUCAP in the last 168 hours.

## 2017-07-03 NOTE — Clinical Social Work Note (Addendum)
Patient medically stable for discharge back to [facility] today. Facility admissions director notified and discharge clinicals transmitted to facility. Patient advised as well as her sister [name] who was also at the bedside.Nurse provided with phone number and patient's room number at SNF to call report. [name] will be transported back to facility by ambulance [phone]). CSW signing off, however please reconult if any other SW interventions services needed prior to discharge.  [name], MSW, LCSW Licensed Clinical Social Worker Clinical Social Work [place] [phone])

## 2017-07-03 NOTE — Progress Notes (Signed)
Progress Note  Patient Name: [name] Date of Encounter: 07/03/2017  Primary Cardiologist: No primary care provider on file.   Subjective   Mild cough this AM, no significant SOB  Inpatient Medications    Scheduled Meds: . bisacodyl  10 mg Rectal QODAY  . carvedilol  6.25 mg Oral Daily  . diclofenac sodium  4 g Topical QID  . enoxaparin (LOVENOX) injection  40 mg Subcutaneous Q24H  . escitalopram  15 mg Oral Daily  . feeding supplement (PRO-STAT SUGAR FREE 64)  30 mL Oral BID BM  . furosemide  40 mg Oral Daily  . hydroxychloroquine  100 mg Oral Daily  . lidocaine  1 patch Transdermal Q24H  . linaclotide  290 mcg Oral Daily  . losartan  25 mg Oral Daily  . methocarbamol  750 mg Oral BID  . metoCLOPramide  5 mg Oral TID  . nystatin  5 mL Oral QID  . oxybutynin  5 mg Oral BID  . pantoprazole  40 mg Oral Daily  . senna-docusate  2 tablet Oral QHS  . sodium chloride flush  3 mL Intravenous Q12H   Continuous Infusions: . sodium chloride     PRN Meds: sodium chloride, acetaminophen, acetaminophen, baclofen, calcium carbonate, cyclobenzaprine, diphenhydrAMINE, fluticasone, HYDROcodone-acetaminophen, ondansetron (ZOFRAN) IV, sodium chloride flush, sodium chloride flush   Vital Signs    Vitals:   07/02/17 1820 07/02/17 1835 07/02/17 2122 07/03/17 0713  BP: 100/70 (!) 114/93 (!) 111/94 90/69  Pulse: 77 85 83 85  Resp:   16 20  Temp:   98.2 F (36.8 C) 98.2 F (36.8 C)  TempSrc:   Oral Oral  SpO2: 98% 96% 97% 97%  Weight:      Height:        Intake/Output Summary (Last 24 hours) at 07/03/2017 0856 Last data filed at 07/03/2017 0600 Gross per 24 hour  Intake 0 ml  Output -  Net 0 ml   Filed Weights   06/30/17 0233 07/01/17 0330 07/02/17 0409  Weight: 156 lb 1.4 oz (70.8 kg) 157 lb 11.5 oz (71.5 kg) 153 lb 14.1 oz (69.8 kg)    Telemetry    NSR - Personally Reviewed  ECG    n/a  Physical Exam   GEN: No acute distress.   Neck: No JVD Cardiac:  RRR, no murmurs, rubs, or gallops.  Respiratory: Clear to auscultation bilaterally. GI: Soft, nontender, non-distended  MS: No edema; No deformity. Neuro:  Nonfocal  Psych: Normal affect   Labs    Chemistry Recent Labs  Lab 06/28/17 2039 06/30/17 0809 07/01/17 0513 07/02/17 0430  NA 143 140 140 138  K 3.5 3.4* 3.5 3.6  CL 107 103 104 100*  CO2 26 29 29 29   GLUCOSE 89 103* 93 85  BUN 10 11 12 11   CREATININE 0.49 0.52 0.53 0.54  CALCIUM 9.2 8.9 8.7* 8.7*  PROT 7.6  --   --   --   ALBUMIN 3.3*  --   --   --   AST 31  --   --   --   ALT 42  --   --   --   ALKPHOS 63  --   --   --   BILITOT 0.5  --   --   --   GFRNONAA >60 >60 >60 >60  GFRAA >60 >60 >60 >60  ANIONGAP 10 8 7 9      Hematology Recent Labs  Lab 06/30/17 0809 07/01/17 0513 07/02/17  0430  WBC 7.2 6.6 7.2  RBC 5.41* 5.33* 5.22*  HGB 13.9 13.4 13.2  HCT 43.4 42.7 41.4  MCV 80.2 80.1 79.3  MCH 25.7* 25.1* 25.3*  MCHC 32.0 31.4 31.9  RDW 16.2* 16.0* 15.9*  PLT 225 228 227    Cardiac Enzymes Recent Labs  Lab 06/29/17 0436 06/29/17 0907  TROPONINI 0.03* 0.04*   No results for input(s): TROPIPOC in the last 168 hours.   BNP Recent Labs  Lab 06/28/17 2039 07/01/17 0513  BNP 704.9* 296.5*     DDimer No results for input(s): DDIMER in the last 168 hours.   Radiology    Dg Swallowing Func-speech Pathology  Result Date: 07/01/2017 Objective Swallowing Evaluation: Type of Study: MBS-Modified Barium Swallow Study  Patient Details Name: SHEZA STRICKLAND MRN: [id number] Date of Birth: [birth year] Today's Date: 07/01/2017 Time: SLP Start Time (ACUTE ONLY): 1225 -SLP Stop Time (ACUTE ONLY): 1245 SLP Time Calculation (min) (ACUTE ONLY): 20 min Past Medical History: Past Medical History: Diagnosis Date . Anemia 10/12/2008 . CHF (congestive heart failure) ([place])   last Echo 2016? Marland Kitchen Diaphragmatic hernia without mention of obstruction or gangrene 08/29/2010 . Edema 05/20/2009 . Gastroparesis 10/05/2010 . GERD  (gastroesophageal reflux disease) 02/02/2009 . Hypertension 10/12/2008 . Hypotension, unspecified 05/20/2009 . Immobility syndrome (paraplegic) 1977 . Iron deficiency anemia, unspecified 10/12/2008 . Leiomyoma of uterus  . Malignant neoplasm of breast (female), unspecified site 10/05/2010  2003, 2013  . Rheumatoid arthritis ([place]) 01/17/2009 Past Surgical History: Past Surgical History: Procedure Laterality Date . [place] . BREAST SURGERY  2003  Bilateral mastectomy . IR FLUORO GUIDE CV LINE RIGHT  06/23/2017 . IR US GUIDE VASC ACCESS RIGHT  06/23/2017 . PRESSURE ULCER DEBRIDEMENT  2009  on back . WOUND DEBRIDEMENT  09/02/2008  Large sacral back open wound HPI: 66 yo with h/o breast cancer s/p dilatations, admitted to [facility] with respiratory deficits.  Pt is s/p thoracentesis and plan is for cardiac cath Friday 07/02/2017.  Pt is rehabing at [facility] and was scheduled for MBS as OP, SNF SLP reached out to SLP and ordered for inpt MBS ordered.  Subjective: pt awake in chair Assessment / Plan / Recommendation CHL IP CLINICAL IMPRESSIONS 07/01/2017 Clinical Impression Patient presents with only minimal decreased oral manipulation with mastication of cracker and oral transiting of barium tablet.  Due to discoordination, patient required several boluses of thin barium and finally had to extend head to aid oral transiting.  Advised she take pills with applesauce - start and follow with liquids- to compensate for difficulties if helpful.  No aspiration or penetration of any consistency tested noted.  Pharyngeal swallow is strong and timely with no residuals. Educated patient to findings/recommendations live using monitor.  barium tablet taken with thin appeared to lodge mid-esophagus without pt awareness, bolus of pudding cleared tablet into stomach but with delay.  Patient may benefit from dedicated esophagram given h/o esophageal strictures requiring dilatation= pt reports EGD approximately 15 years ago but  she does deny current symptoms.  Of note, patient reports she frequently consumes her meals reclined in bed - advised her to upright as much as able and when at [employer] = eating out of bed in chair recommended.  Recommend follow up at next venue for dysphagia management.  Thanks for this referral.  SLP Visit Diagnosis Dysphagia, oral phase (R13.11) Attention and concentration deficit following -- Frontal lobe and executive function deficit following -- Impact on safety and function Mild  aspiration risk   CHL IP TREATMENT RECOMMENDATION 07/01/2017 Treatment Recommendations No treatment recommended at this time   Prognosis 07/01/2017 Prognosis for Safe Diet Advancement Guarded Barriers to Reach Goals -- Barriers/Prognosis Comment -- CHL IP DIET RECOMMENDATION 07/01/2017 SLP Diet Recommendations Regular solids;Thin liquid Liquid Administration via Cup;Straw Medication Administration Whole meds with puree Compensations Slow rate;Small sips/bites Postural Changes Seated upright at 90 degrees;Remain semi-upright after after feeds/meals (Comment)   CHL IP OTHER RECOMMENDATIONS 07/01/2017 Recommended Consults Consider esophageal assessment Oral Care Recommendations Oral care QID Other Recommendations --   No flowsheet data found.  No flowsheet data found.     CHL IP ORAL PHASE 07/01/2017 Oral Phase Impaired Oral - Pudding Teaspoon -- Oral - Pudding Cup -- Oral - Honey Teaspoon -- Oral - Honey Cup -- Oral - Nectar Teaspoon -- Oral - Nectar Cup WFL Oral - Nectar Straw -- Oral - Thin Teaspoon WFL Oral - Thin Cup WFL Oral - Thin Straw WFL Oral - Puree WFL Oral - Mech Soft -- Oral - Regular Impaired mastication;Reduced posterior propulsion;Delayed oral transit;Lingual/palatal residue Oral - Multi-Consistency Reduced posterior propulsion;Weak lingual manipulation Oral - Pill Reduced posterior propulsion;Weak lingual manipulation;Other (Comment) Oral Phase - Comment --  CHL IP PHARYNGEAL PHASE 07/01/2017 Pharyngeal Phase WFL  Pharyngeal- Pudding Teaspoon -- Pharyngeal -- Pharyngeal- Pudding Cup -- Pharyngeal -- Pharyngeal- Honey Teaspoon -- Pharyngeal -- Pharyngeal- Honey Cup -- Pharyngeal -- Pharyngeal- Nectar Teaspoon -- Pharyngeal -- Pharyngeal- Nectar Cup WFL Pharyngeal -- Pharyngeal- Nectar Straw -- Pharyngeal -- Pharyngeal- Thin Teaspoon WFL Pharyngeal -- Pharyngeal- Thin Cup WFL Pharyngeal -- Pharyngeal- Thin Straw WFL Pharyngeal -- Pharyngeal- Puree WFL Pharyngeal -- Pharyngeal- Mechanical Soft -- Pharyngeal -- Pharyngeal- Regular WFL Pharyngeal -- Pharyngeal- Multi-consistency WFL Pharyngeal -- Pharyngeal- Pill WFL Pharyngeal -- Pharyngeal Comment --  CHL IP CERVICAL ESOPHAGEAL PHASE 07/01/2017 Cervical Esophageal Phase Impaired Pudding Teaspoon -- Pudding Cup -- Honey Teaspoon -- Honey Cup -- Nectar Teaspoon -- Nectar Cup -- Nectar Straw -- Thin Teaspoon -- Thin Cup -- Thin Straw -- Puree -- Mechanical Soft -- Regular -- Multi-consistency -- Pill -- Cervical Esophageal Comment barium tablet taken with thin appeared to lodge mid-esophagus without pt awareness, bolus of pudding cleared tablet into stomach but with delay; pt may benefit from dedicated esophagram given h/o esophageal strictures requiring dilatation= pt reports approximately 15 years ago but she does deny issues with sensing food or pills lodging in esophagus No flowsheet data found. Luanna Salk, [facility] SLP [phone]               Cardiac Studies    Patient Profile   66 y.o. female with a hx of paraplegia-wheelchair bound, diastolicCHF, rheumatoid arthritis, breast cancerin 2003 with recurrence in 2013 status post mastectomy and prior chemotherapy, anemia, large diaphragmatic hernia, gastroparesis, hypertension, and rheumatoid arthritiswho is being seen today for the evaluation of CHF.She presents with recurrent shortness of breath and has been treated for successive pneumonias without adequate response to antibiotics. She then required supplemental  oxygen about 2 weeks ago.Now found to have large pleural effusion and newly reduced LV function.     Assessment & Plan    1. Acute combined systolic/diastolic HF - echo 69/4854 LVEF 20-25%, grade II diastoilc dysfunction, moderate MR, PASP 51. Normal LVEF in 2016 - cath 06/2017 with no significant CAD, normal to mildly elevated filling pressures, mild pulm HTN. CI 2.65, mean PA 26, PCWP 17 - I/Os incomplete this admission - medical therapy with coreg 6.55m daily (will change to 3.1232m  bid), losartan 37m. Soft bp's limit further titration.  - transitioned from IV to oral lasix,mildly elevated PCWP. Continue oral lasix. Renal function is stable - post cath labs pending  2. Pleural effusion - per primary team, s/p thoracentesisi with 1.2L removed  Ok for discharge from cardiac standpoint once labs are back today. We will arrange outpatient f/u in 2 weeks.   For questions or updates, please contact [place] ParkPlease consult [email or web address] for contact info under Cardiology/STEMI.      SMerrily Pew MD  07/03/2017, 8:56 AM

## 2017-07-03 NOTE — Progress Notes (Signed)
Patient BP 84/59.  No c/o dizziness, lethargy, or other signs or symptoms of hypotension.  Dr. Charlies Silvers aware pt's BP has been soft.  Will continue to monitor closely until discharge.

## 2017-07-04 ENCOUNTER — Encounter (HOSPITAL_COMMUNITY): Payer: Self-pay | Admitting: Cardiology

## 2017-07-04 LAB — CULTURE, BODY FLUID-BOTTLE: CULTURE: NO GROWTH

## 2017-07-13 ENCOUNTER — Other Ambulatory Visit (HOSPITAL_COMMUNITY): Payer: Self-pay | Admitting: Family Medicine

## 2017-07-13 DIAGNOSIS — B999 Unspecified infectious disease: Secondary | ICD-10-CM
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
## 2017-07-15 ENCOUNTER — Encounter (HOSPITAL_COMMUNITY): Payer: Self-pay | Admitting: Diagnostic Radiology

## 2017-07-15 ENCOUNTER — Ambulatory Visit (HOSPITAL_COMMUNITY)
Admission: RE | Admit: 2017-07-15 | Discharge: 2017-07-15 | Disposition: A | Discharge status: Home / Self Care | Payer: Medicare Other | Source: Ambulatory Visit | Attending: Family Medicine | Admitting: Family Medicine

## 2017-07-15 DIAGNOSIS — B999 Unspecified infectious disease: Secondary | ICD-10-CM

## 2017-07-15 DIAGNOSIS — J90 Pleural effusion, not elsewhere classified: Secondary | ICD-10-CM | POA: Insufficient documentation

## 2017-07-15 HISTORY — PX: IR REMOVAL TUN CV CATH W/O FL: IMG2289

## 2017-07-15 MED ORDER — LIDOCAINE 2% (20 MG/ML) 5 ML SYRINGE
INTRAMUSCULAR | Status: AC
  Filled 2017-07-15: qty 5

## 2017-07-15 MED ORDER — CHLORHEXIDINE GLUCONATE 4 % EX LIQD
CUTANEOUS | Status: AC
  Filled 2017-07-15: qty 15

## 2017-07-15 NOTE — Procedures (Signed)
Completed antibiotic therapy and no longer needs central line.  The central line was easily removed with traction after retention suture was removed.  No bleeding and no immediate complication.

## 2017-07-16 ENCOUNTER — Ambulatory Visit (INDEPENDENT_AMBULATORY_CARE_PROVIDER_SITE_OTHER): Payer: Medicare Other | Admitting: Cardiology

## 2017-07-16 ENCOUNTER — Encounter: Payer: Self-pay | Admitting: Cardiology

## 2017-07-16 VITALS — BP 122/88 | HR 90 | Ht 65.5 in | Wt 156.0 lb

## 2017-07-16 DIAGNOSIS — I5021 Acute systolic (congestive) heart failure: Secondary | ICD-10-CM

## 2017-07-16 DIAGNOSIS — I10 Essential (primary) hypertension: Secondary | ICD-10-CM

## 2017-07-16 DIAGNOSIS — I340 Nonrheumatic mitral (valve) insufficiency: Secondary | ICD-10-CM

## 2017-07-16 MED ORDER — CARVEDILOL 6.25 MG PO TABS: 6.2500 mg | ORAL_TABLET | Freq: Two times a day (BID) | ORAL | 3 refills | Status: DC

## 2017-07-16 NOTE — Patient Instructions (Addendum)
Medication Instructions:  INCREASE- Carvedilol 6.25 mg twice a day  If you need a refill on your cardiac medications before your next appointment, please call your pharmacy.  Labwork: None Ordered   Testing/Procedures: None Ordered  Special Instructions:  Happy Holidays!!  Follow-Up: Your physician wants you to follow-up in: 2 Weeks with Suanne Marker or [employer].    Thank you for choosing [facility] at [facility]!!

## 2017-07-22 ENCOUNTER — Encounter: Payer: Self-pay | Admitting: *Deleted

## 2017-07-30 ENCOUNTER — Other Ambulatory Visit (HOSPITAL_COMMUNITY): Payer: Self-pay | Admitting: Physician Assistant

## 2017-07-30 ENCOUNTER — Encounter: Payer: Self-pay | Admitting: Physician Assistant

## 2017-07-30 ENCOUNTER — Other Ambulatory Visit (HOSPITAL_COMMUNITY): Payer: Self-pay | Admitting: Internal Medicine

## 2017-07-30 ENCOUNTER — Ambulatory Visit: Payer: Medicare Other | Admitting: Physician Assistant

## 2017-07-30 ENCOUNTER — Ambulatory Visit (HOSPITAL_COMMUNITY)
Admission: RE | Admit: 2017-07-30 | Discharge: 2017-07-30 | Disposition: A | Discharge status: Home / Self Care | Payer: Medicare Other | Source: Ambulatory Visit | Attending: Internal Medicine | Admitting: Internal Medicine

## 2017-07-30 ENCOUNTER — Ambulatory Visit (HOSPITAL_COMMUNITY)
Admission: RE | Admit: 2017-07-30 | Discharge: 2017-07-30 | Disposition: A | Discharge status: Home / Self Care | Payer: Medicare Other | Source: Ambulatory Visit | Attending: Physician Assistant | Admitting: Physician Assistant

## 2017-07-30 DIAGNOSIS — J90 Pleural effusion, not elsewhere classified: Secondary | ICD-10-CM

## 2017-07-30 DIAGNOSIS — Z9889 Other specified postprocedural states: Secondary | ICD-10-CM

## 2017-07-30 HISTORY — PX: IR THORACENTESIS RIGHT ASP PLEURAL SPACE W/IMG GUIDE: IMG5380

## 2017-07-30 MED ORDER — LIDOCAINE 2% (20 MG/ML) 5 ML SYRINGE
INTRAMUSCULAR | Status: AC
  Filled 2017-07-30: qty 10

## 2017-07-30 NOTE — Progress Notes (Deleted)
Cardiology Office Note   Date:  07/30/2017   ID:  [name], DOB 10/23/1950, MRN [id number]  PCP:  No primary care provider on file.  Cardiologist: Dr. Percival Spanish, 07/16/2017 Rosaria Ferries, PA-C   No chief complaint on file.   History of Present Illness: [name] is a 67 y.o. 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  [name] presents for ***   Past Medical History:  Diagnosis Date  . Anemia 10/12/2008  . CHF (congestive heart failure) ([place])    last Echo 2016?  Marland Kitchen Diaphragmatic hernia without mention of obstruction or gangrene 08/29/2010  . Edema 05/20/2009  . Gastroparesis 10/05/2010  . GERD (gastroesophageal reflux disease) 02/02/2009  . Hypertension 10/12/2008  . Hypotension, unspecified 05/20/2009  . Immobility syndrome (paraplegic) 1977  . Iron deficiency anemia, unspecified 10/12/2008  . Leiomyoma of uterus   . Malignant neoplasm of breast (female), unspecified site 10/05/2010   2003, 2013   . Rheumatoid arthritis ([place]) 01/17/2009    Past Surgical History:  Procedure Laterality Date  . [place]  . BREAST SURGERY  2003   Bilateral mastectomy  . IR FLUORO GUIDE CV LINE RIGHT  06/23/2017  . IR REMOVAL TUN CV CATH W/O FL  07/15/2017  . IR US GUIDE VASC ACCESS RIGHT  06/23/2017  . PRESSURE ULCER DEBRIDEMENT  2009   on back  . RIGHT/LEFT HEART CATH AND CORONARY ANGIOGRAPHY N/A 07/02/2017   Procedure: RIGHT/LEFT HEART CATH AND CORONARY ANGIOGRAPHY;  Surgeon: Martinique, Peter M, MD;  Location: [facility];  Service:  Cardiovascular;  Laterality: N/A;  . WOUND DEBRIDEMENT  09/02/2008   Large sacral back open wound    Current Outpatient Medications  Medication Sig Dispense Refill  . acetaminophen (TYLENOL) 325 MG tablet Take 650 mg by mouth every 6 (six) hours as needed.    . Amino Acids-Protein Hydrolys (FEEDING SUPPLEMENT, PRO-STAT SUGAR FREE 64,) LIQD Take 30 mLs by mouth. 30 cc BID PO QD    . baclofen (LIORESAL) 10 MG tablet Take 10 mg by mouth 3 (three) times daily as needed.    . bisacodyl (DULCOLAX) 10 MG suppository Place 10 mg rectally every other day.     . calcium carbonate (TUMS - DOSED IN MG ELEMENTAL CALCIUM) 500 MG chewable tablet Chew 2 tablets by mouth every 8 (eight) hours as needed for indigestion or heartburn.    . calcium-vitamin D (OYSTER CALCIUM 500 + D) 500-200 MG-UNIT per tablet Take 1 tablet by mouth 2 (two) times daily.     . carvedilol (COREG) 6.25 MG tablet Take 1 tablet (6.25 mg total) by mouth 2 (two) times daily with a meal. 180 tablet 3  . cetirizine (ZYRTEC ALLERGY) 10 MG tablet Take 10 mg by mouth daily as needed for allergies.     . cholecalciferol (VITAMIN D) 1000 units tablet Take 1,000 Units by mouth daily.    . Cranberry 425 MG CAPS Take 425 mg by mouth daily.    . cyclobenzaprine (FLEXERIL) 10 MG tablet Take  10 mg by mouth daily as needed for muscle spasms.    . diclofenac sodium (VOLTAREN) 1 % GEL Apply 3 g topically 4 (four) times daily.    . diphenhydrAMINE (BENADRYL) 25 MG tablet Take 25 mg by mouth every 8 (eight) hours as needed for itching.    . escitalopram (LEXAPRO) 5 MG tablet Take 15 mg by mouth daily.    . fluticasone (FLONASE) 50 MCG/ACT nasal spray Place 2 sprays into both nostrils daily as needed for allergies.     . furosemide (LASIX) 20 MG tablet Take 20 mg by mouth daily.    Marland Kitchen HYDROcodone-acetaminophen (NORCO) 7.5-325 MG tablet Take 1 tablet by mouth every 6 (six) hours as needed for moderate pain.    . hydrocortisone 2.5 % cream Apply 1 application  topically 2 (two) times daily. To rash    . hydroxychloroquine (PLAQUENIL) 200 MG tablet Take 100 mg by mouth daily.     . Lidocaine (LIDODERM EX) Apply topically. Apply to right shoulder topically every 12 hours for pain. On in AM and off in PM    . Linaclotide (LINZESS) 290 MCG CAPS capsule Take 1 capsule (290 mcg total) by mouth daily. 30 capsule 11  . losartan (COZAAR) 25 MG tablet Take 25 mg by mouth daily.    . methocarbamol (ROBAXIN) 750 MG tablet Take 750 mg by mouth 2 (two) times daily.     . metoCLOPramide (REGLAN) 5 MG tablet Take 5 mg by mouth 3 (three) times daily.    . Multiple Vitamins-Minerals (DECUBI-VITE) CAPS Take 1 capsule by mouth daily.    Marland Kitchen nystatin (MYCOSTATIN) 100000 UNIT/ML suspension Take 5 mLs by mouth 4 (four) times daily.    Marland Kitchen omeprazole (PRILOSEC) 20 MG capsule Take 20 mg by mouth daily.    Marland Kitchen oxybutynin (DITROPAN) 5 MG tablet Take 5 mg by mouth 2 (two) times daily.    . Prenatal Multivit-Min-Fe-FA (PRENATAL 1 + IRON PO) Take 1 tablet by mouth daily.    . promethazine (PHENERGAN) 25 MG tablet Take 25 mg by mouth every 6 (six) hours as needed for nausea or vomiting.    . Salicylic Acid (NEUTROGENA OIL-FREE ACNE WASH EX) Apply 1 application topically every 8 (eight) hours as needed (for acne on face).     . sennosides-docusate sodium (SENOKOT-S) 8.6-50 MG tablet Take 2 tablets by mouth at bedtime.     No current facility-administered medications for this visit.     Allergies:   Patient has no known allergies.    Social History:  The patient  reports that she quit smoking about 22 years ago. She has a 10.00 pack-year smoking history. she has never used smokeless tobacco. She reports that she does not drink alcohol or use drugs.   Family History:  The patient's family history includes Cancer in her maternal aunt; Hypertension in her maternal aunt, maternal uncle, and mother; Stroke in her mother.    ROS:  Please see the history of present illness. All other  systems are reviewed and negative.    PHYSICAL EXAM: VS:  There were no vitals taken for this visit. , BMI There is no height or weight on file to calculate BMI. GEN: Well nourished, well developed, female in no acute distress  HEENT: normal for age  Neck: no JVD, no carotid bruit, no masses Cardiac: RRR; no murmur, no rubs, or gallops Respiratory:  clear to auscultation bilaterally, normal work of breathing GI: soft, nontender, nondistended, + BS MS: no deformity or  atrophy; no edema; distal pulses are 2+ in all 4 extremities   Skin: warm and dry, no rash Neuro:  Strength and sensation are intact Psych: euthymic mood, full affect   EKG:  EKG {ACTION; IS/IS KXF:[id number]} ordered today. The ekg ordered today demonstrates ***  ECHO: 06/29/2017 - Left ventricle: The cavity size was normal. Wall thickness was   normal. Systolic function was severely reduced. The estimated   ejection fraction was in the range of 20% to 25%. Features are   consistent with a pseudonormal left ventricular filling pattern,   with concomitant abnormal relaxation and increased filling   pressure (grade 2 diastolic dysfunction). - Mitral valve: There was moderate regurgitation. - Pulmonary arteries: Systolic pressure was moderately increased.   PA peak pressure: 51 mm Hg (S).  CARDIAC CATH: 07/02/2017  There is severe left ventricular systolic dysfunction.  LV end diastolic pressure is normal.  The left ventricular ejection fraction is 25-35% by visual estimate.  Hemodynamic findings consistent with mild pulmonary hypertension.  LV end diastolic pressure is normal.  1. Normal coronary anatomy 2. Severe LV dysfunction EF 25-30% 3. Mild pulmonary HTN 4. Normal LV filling pressures 5. Preserved cardiac output Plan: continue medical therapy for CHF  Recent Labs: 06/28/2017: ALT 42 07/01/2017: B Natriuretic Peptide 296.5 07/02/2017: BUN 11; Creatinine, Ser 0.54; Hemoglobin 13.2; Platelets 227;  Potassium 3.6; Sodium 138    Lipid Panel    Component Value Date/Time   CHOL 130 07/02/2017 0430   TRIG 102 07/02/2017 0430   HDL 33 (L) 07/02/2017 0430   CHOLHDL 3.9 07/02/2017 0430   VLDL 20 07/02/2017 0430   LDLCALC 77 07/02/2017 0430     Wt Readings from Last 3 Encounters:  07/16/17 156 lb (70.8 kg)  07/03/17 157 lb 3 oz (71.3 kg)  01/07/17 157 lb (71.2 kg)     Other studies Reviewed: Additional studies/ records that were reviewed today include: ***.  ASSESSMENT AND PLAN:  1.  ***   Current medicines are reviewed at length with the patient today.  The patient {ACTIONS; HAS/DOES NOT HAVE:19233} concerns regarding medicines.  The following changes have been made:  {PLAN; NO CHANGE:13088:s}  Labs/ tests ordered today include: *** No orders of the defined types were placed in this encounter.    Disposition:   FU with Dr. Percival Spanish  Signed, Rosaria Ferries, PA-C  07/30/2017 8:43 AM    [place] Phone: [phone]; Fax: [phone]  This note was written with the assistance of speech recognition software. Please excuse any transcriptional errors.

## 2017-07-30 NOTE — Procedures (Signed)
PROCEDURE SUMMARY:  Successful US guided right thoracentesis. Yielded 1.1 liters of clear yellow fluid. Pt tolerated procedure well. No immediate complications.  Post procedure chest X-ray reveals no pneumothorax  Arantxa Piercey S Cornel Werber PA-C 07/30/2017 2:23 PM

## 2017-08-07 DIAGNOSIS — I340 Nonrheumatic mitral (valve) insufficiency: Secondary | ICD-10-CM | POA: Insufficient documentation

## 2017-08-07 DIAGNOSIS — I5042 Chronic combined systolic (congestive) and diastolic (congestive) heart failure: Secondary | ICD-10-CM

## 2017-08-13 ENCOUNTER — Institutional Professional Consult (permissible substitution): Payer: Medicare Other | Admitting: Pulmonary Disease

## 2017-08-25 ENCOUNTER — Encounter: Payer: Self-pay | Admitting: Cardiology

## 2017-08-25 ENCOUNTER — Ambulatory Visit (INDEPENDENT_AMBULATORY_CARE_PROVIDER_SITE_OTHER): Payer: Medicare Other | Admitting: Cardiology

## 2017-08-25 DIAGNOSIS — I519 Heart disease, unspecified: Secondary | ICD-10-CM

## 2017-08-25 DIAGNOSIS — I428 Other cardiomyopathies: Secondary | ICD-10-CM

## 2017-08-25 DIAGNOSIS — Z853 Personal history of malignant neoplasm of breast: Secondary | ICD-10-CM

## 2017-08-25 DIAGNOSIS — J90 Pleural effusion, not elsewhere classified: Secondary | ICD-10-CM

## 2017-08-25 DIAGNOSIS — Z0389 Encounter for observation for other suspected diseases and conditions ruled out: Secondary | ICD-10-CM | POA: Diagnosis not present

## 2017-08-25 DIAGNOSIS — I5189 Other ill-defined heart diseases: Secondary | ICD-10-CM | POA: Insufficient documentation

## 2017-08-25 DIAGNOSIS — G8220 Paraplegia, unspecified: Secondary | ICD-10-CM | POA: Diagnosis not present

## 2017-08-25 DIAGNOSIS — IMO0001 Reserved for inherently not codable concepts without codable children: Secondary | ICD-10-CM | POA: Insufficient documentation

## 2017-08-25 NOTE — Patient Instructions (Signed)
Continue same medications    Your physician recommends that you schedule a follow-up appointment with Dr.Hochrein Thursday 10/28/17 at 1:20 pm

## 2017-08-25 NOTE — Assessment & Plan Note (Signed)
S/p surgery in 2002 and 2013
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
## 2017-08-25 NOTE — Assessment & Plan Note (Signed)
Grade 2 DD on echo 
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
## 2017-08-25 NOTE — Assessment & Plan Note (Signed)
Dec 2018

## 2017-08-25 NOTE — Assessment & Plan Note (Signed)
EF 20-25% by echo Dec 2018

## 2017-08-25 NOTE — Assessment & Plan Note (Signed)
Recurrent Rt pleural effusion- s/p Pleurex placed 08/12/17 [facility])

## 2017-09-14 ENCOUNTER — Institutional Professional Consult (permissible substitution): Payer: Medicare Other | Admitting: Pulmonary Disease

## 2017-10-07 ENCOUNTER — Telehealth: Payer: Self-pay | Admitting: Cardiology

## 2017-10-07 NOTE — Telephone Encounter (Signed)
Closed Encounter  °

## 2017-10-15 ENCOUNTER — Encounter: Payer: Self-pay | Admitting: Cardiology

## 2017-10-20 ENCOUNTER — Ambulatory Visit (INDEPENDENT_AMBULATORY_CARE_PROVIDER_SITE_OTHER): Payer: Medicare Other | Admitting: Pulmonary Disease

## 2017-10-20 ENCOUNTER — Encounter: Payer: Self-pay | Admitting: Pulmonary Disease

## 2017-10-20 DIAGNOSIS — J90 Pleural effusion, not elsewhere classified: Secondary | ICD-10-CM | POA: Diagnosis not present
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
## 2017-10-20 NOTE — Patient Instructions (Signed)
Will schedule CT chest  Follow up in 2 weeks with Dr. Halford Chessman or Nurse Practitioner

## 2017-10-20 NOTE — Progress Notes (Signed)
   Subjective:    Patient ID: [name], female    DOB: [birth year], 67 y.o.   MRN: [id number]  HPI    Review of Systems  Constitutional: Negative for fever and unexpected weight change.  HENT: Positive for postnasal drip. Negative for congestion, dental problem, ear pain, nosebleeds, rhinorrhea, sinus pressure, sneezing, sore throat and trouble swallowing.   Eyes: Negative for redness and itching.  Respiratory: Positive for cough, chest tightness, shortness of breath and wheezing.   Cardiovascular: Negative for palpitations and leg swelling.  Gastrointestinal: Positive for nausea. Negative for vomiting.  Genitourinary: Negative for dysuria.  Musculoskeletal: Negative for joint swelling.  Skin: Negative for rash.  Allergic/Immunologic: Negative.  Negative for environmental allergies, food allergies and immunocompromised state.  Neurological: Positive for headaches.  Hematological: Does not bruise/bleed easily.  Psychiatric/Behavioral: Negative for dysphoric mood. The patient is not nervous/anxious.        Objective:   Physical Exam        Assessment & Plan:

## 2017-10-24 NOTE — Progress Notes (Signed)
Cardiology Office Note   Date:  10/26/2017   ID:  [name], DOB 01/24/1951, MRN [id number]  PCP:  Salome Arnt, [place]  Cardiologist:   Minus Breeding, MD   Chief Complaint  Patient presents with  . Cough      History of Present Illness: [name] is a 67 y.o. female who presents for follow up of congestive heart failure.  She was in the hospital in Dec 2018.   In 2016 she had a normal EF.  However, during the recent hospitalization she had an EF of 20%. Cath demonstrated normal coronaries.  She had thoracentesis of 1.2 liters on the left.  She went back to the nursing home where she lives but required another thoracentesis.  This one was on the right.  She had a Pleurex catheter placed.  She was in [facility] for that event in January of this year.  She was seen in follow up in the office after that.  She returns for follow-up.  She says she is now getting the Pleurx drain about 500 cc once a week which is less frequent than previous but about the same volume.  He is bothered by a cough.  This is more prominent tries to go to sleep at night.  She will feel hot coughing.  Is not able to sit up because she gets cramps when she does this.  He is lying relatively flat on an air mattress.  Is not convinced that this is positional or PND/orthopnea however still coughs sometimes during the day when she is seated upright..  No palpitations, presyncope or syncope.  Nonproductive cough and she has not had fevers.  Of note I did review pulmonary records and she has been seen by Dr. Halford Chessman.  I reviewed the labs from thoracentesis to date of exudate.  It is thought that this could be related to her heart failure.  He also mentions the possibility of cirrhosis.  There is a CT of the chest abd scheduled for tomorrow.  He also mentions possible pleurodesis.  He might send off labs.       Past Medical History:  Diagnosis Date  . Acute systolic CHF (congestive heart failure) ([place])  06/28/2017   EF was normal 2016, now 20% with grade 2 diastolic dysfunction, no significant CAD at cath  . Anemia 10/12/2008  . Diaphragmatic hernia without mention of obstruction or gangrene 08/29/2010  . Edema 05/20/2009  . Gastroparesis 10/05/2010  . GERD (gastroesophageal reflux disease) 02/02/2009  . Hypertension 10/12/2008  . Hypotension, unspecified 05/20/2009  . Immobility syndrome (paraplegic) 1977  . Iron deficiency anemia, unspecified 10/12/2008  . Leiomyoma of uterus   . Malignant neoplasm of breast (female), unspecified site 10/05/2010   2003, 2013   . Rheumatoid arthritis ([place]) 01/17/2009    Past Surgical History:  Procedure Laterality Date  . [place]  . BREAST SURGERY  2003   Bilateral mastectomy  . IR FLUORO GUIDE CV LINE RIGHT  06/23/2017  . IR REMOVAL TUN CV CATH W/O FL  07/15/2017  . IR THORACENTESIS ASP PLEURAL SPACE W/IMG GUIDE  07/30/2017  . IR US GUIDE VASC ACCESS RIGHT  06/23/2017  . PRESSURE ULCER DEBRIDEMENT  2009   on back  . RIGHT/LEFT HEART CATH AND CORONARY ANGIOGRAPHY N/A 07/02/2017   Procedure: RIGHT/LEFT HEART CATH AND CORONARY ANGIOGRAPHY;  Surgeon: Martinique, Peter M, MD;  Location: [facility];  Service: Cardiovascular;  Laterality: N/A;  . WOUND DEBRIDEMENT  09/02/2008   Large sacral back open wound     Current Outpatient Medications  Medication Sig Dispense Refill  . acetaminophen (TYLENOL) 325 MG tablet Take 650 mg by mouth every 6 (six) hours as needed.    . baclofen (LIORESAL) 10 MG tablet Take 10 mg by mouth 3 (three) times daily as needed.    . bisacodyl (DULCOLAX) 10 MG suppository Place 10 mg rectally every other day.     . calcium carbonate (TUMS - DOSED IN MG ELEMENTAL CALCIUM) 500 MG chewable tablet Chew 2 tablets by mouth every 8 (eight) hours as needed for indigestion or heartburn.    . calcium-vitamin D (OYSTER CALCIUM 500 + D) 500-200 MG-UNIT per tablet Take 1 tablet by mouth 2 (two) times daily.      . carvedilol (COREG) 6.25 MG tablet Take 1 tablet (6.25 mg total) by mouth 2 (two) times daily with a meal. 180 tablet 3  . cetirizine (ZYRTEC ALLERGY) 10 MG tablet Take 10 mg by mouth daily as needed for allergies.     . cholecalciferol (VITAMIN D) 1000 units tablet Take 1,000 Units by mouth daily.    . Cranberry 425 MG CAPS Take 425 mg by mouth daily.    . cyclobenzaprine (FLEXERIL) 10 MG tablet Take 10 mg by mouth daily as needed for muscle spasms.    . diclofenac sodium (VOLTAREN) 1 % GEL Apply 3 g topically 4 (four) times daily.    . diphenhydrAMINE (BENADRYL) 25 MG tablet Take 25 mg by mouth every 8 (eight) hours as needed for itching.    . escitalopram (LEXAPRO) 5 MG tablet Take 5 mg by mouth daily.     . fluticasone (FLONASE) 50 MCG/ACT nasal spray Place 2 sprays into both nostrils daily as needed for allergies.     . furosemide (LASIX) 40 MG tablet Take 40 mg by mouth daily.     Marland Kitchen HYDROcodone-acetaminophen (NORCO) 7.5-325 MG tablet Take 1 tablet by mouth every 6 (six) hours as needed for moderate pain.    . hydrocortisone 2.5 % cream Apply 1 application topically 2 (two) times daily. To rash    . hydroxychloroquine (PLAQUENIL) 200 MG tablet Take 100 mg by mouth daily.     Marland Kitchen ipratropium-albuterol (DUONEB) 0.5-2.5 (3) MG/3ML SOLN Take 3 mLs by nebulization every 4 (four) hours as needed for Wheezing.    . Lidocaine (LIDODERM EX) Apply topically. Apply to right shoulder topically every 12 hours for pain. On in AM and off in PM    . Linaclotide (LINZESS) 290 MCG CAPS capsule Take 1 capsule (290 mcg total) by mouth daily. 30 capsule 11  . losartan (COZAAR) 25 MG tablet Take 25 mg by mouth daily.    . methocarbamol (ROBAXIN) 750 MG tablet Take 750 mg by mouth 2 (two) times daily.     . metoCLOPramide (REGLAN) 5 MG tablet Take 5 mg by mouth 3 (three) times daily.    . mirtazapine (REMERON) 7.5 MG tablet Take by mouth.    . Multiple Vitamins-Minerals (DECUBI-VITE) CAPS Take 1 capsule by  mouth daily.    Marland Kitchen omega-3 fish oil (MAXEPA) 1000 MG CAPS capsule Take by mouth.    Marland Kitchen omeprazole (PRILOSEC) 20 MG capsule Take 20 mg by mouth daily.    Marland Kitchen oxybutynin (DITROPAN) 5 MG tablet Take 5 mg by mouth 2 (two) times daily.    . promethazine (PHENERGAN) 25 MG tablet Take 25 mg by mouth every 6 (six)  hours as needed for nausea or vomiting.    . Salicylic Acid (NEUTROGENA OIL-FREE ACNE WASH EX) Apply 1 application topically every 8 (eight) hours as needed (for acne on face).     . sennosides-docusate sodium (SENOKOT-S) 8.6-50 MG tablet Take 2 tablets by mouth at bedtime.    . carvedilol (COREG) 3.125 MG tablet Take 1 tablet (3.125 mg total) by mouth 2 (two) times daily. 180 tablet 3   No current facility-administered medications for this visit.     Allergies:   Patient has no known allergies.     ROS:  Please see the history of present illness.   Otherwise, review of systems are positive none..   All other systems are reviewed and negative.    PHYSICAL EXAM: BP 113/82   Pulse 99   Ht 5' 5.5" (1.664 m)   Wt 156 lb (70.8 kg)   BMI 25.56 kg/m    GEN:  No distress NECK:  No jugular venous distention at 90 degrees, waveform within normal limits, carotid upstroke brisk and symmetric, no bruits, no thyromegaly LYMPHATICS:  No cervical adenopathy LUNGS:  Clear to auscultation bilaterally CHEST: Right Pleurx catheter, decreased breath sound right lung HEART:  S1 and S2 within normal limits, no S3, no S4, no clicks, no rubs, 3 out of 6 holosystolic murmur heard at the apex and radiating slightly to the axilla, no diastolic murmurs ABD:  Positive bowel sounds normal in frequency in pitch, no bruits, no rebound, no guarding, unable to assess midline mass or bruit with the patient seated, mild distention EXT:  2 plus pulses throughout, moderate edema, no cyanosis no clubbing SKIN:  No rashes no nodules NEURO: Paralysis PSYCH:  Cognitively intact, oriented to person place and time    EKG:   EKG is ordered today. Sinus rhythm, rate 99, axis within normal limits, intervals within normal limits, no acute ST-T wave changes.   Recent Labs: 06/28/2017: ALT 42 07/01/2017: B Natriuretic Peptide 296.5 07/02/2017: BUN 11; Creatinine, Ser 0.54; Hemoglobin 13.2; Platelets 227; Potassium 3.6; Sodium 138    Lipid Panel    Component Value Date/Time   CHOL 130 07/02/2017 0430   TRIG 102 07/02/2017 0430   HDL 33 (L) 07/02/2017 0430   CHOLHDL 3.9 07/02/2017 0430   VLDL 20 07/02/2017 0430   LDLCALC 77 07/02/2017 0430      Wt Readings from Last 3 Encounters:  10/26/17 156 lb (70.8 kg)  08/25/17 156 lb (70.8 kg)  07/16/17 156 lb (70.8 kg)      Other studies Reviewed: Additional studies/ records that were reviewed today include:   Hospital records including labs, pulmonary records    ASSESSMENT AND PLAN:   Pleural effusion The etiology of the recurrent effusion by Dr. Halford Chessman.  This could be related to heart failure.  However, she doesn't have overt pulmonary edema.  I did review her right and left heart catheterization from when she was in the hospital and she did not have particularly elevated pulmonary pressures despite sedation.  Consider further cardiac evaluation potentially with a TEE further you from [facility] evaluation.   NICM (nonischemic cardiomyopathy) ([place]) The reason for the decline from her previous echo is not clear.  She did not have CAD.   EF 20-25% by echo Dec 2018.  For now I will titrate her Coreg to 9.375 mg bid. .  I will consider TEE as above.   Paraplegia [facility]) She gets around in a motorized wheelchair.    Cough This is likely  related to position as it seems to happen more at night though this is not entirely clear.  It is also likely related to atelectasis from compression.  Try wearing her oxygen I sent a message to Dr. Halford Chessman to see if he has further suggestions.    Current medicines are reviewed at length with the patient today.  The patient does not  have concerns regarding medicines.  The following changes have been made:   As above  Labs/ tests ordered today include:  None     Disposition:   FU with me in one month.    Signed, Minus Breeding, MD  10/26/2017 1:09 PM    [facility]

## 2017-10-26 ENCOUNTER — Encounter: Payer: Self-pay | Admitting: Cardiology

## 2017-10-26 ENCOUNTER — Ambulatory Visit (INDEPENDENT_AMBULATORY_CARE_PROVIDER_SITE_OTHER): Payer: Medicare Other | Admitting: Cardiology

## 2017-10-26 VITALS — BP 113/82 | HR 99 | Ht 65.5 in | Wt 156.0 lb

## 2017-10-26 DIAGNOSIS — I428 Other cardiomyopathies: Secondary | ICD-10-CM | POA: Diagnosis not present

## 2017-10-26 DIAGNOSIS — J90 Pleural effusion, not elsewhere classified: Secondary | ICD-10-CM

## 2017-10-26 DIAGNOSIS — I10 Essential (primary) hypertension: Secondary | ICD-10-CM | POA: Diagnosis not present

## 2017-10-26 MED ORDER — CARVEDILOL 3.125 MG PO TABS: 3.1250 mg | ORAL_TABLET | Freq: Two times a day (BID) | ORAL | 3 refills | Status: DC

## 2017-10-26 NOTE — Patient Instructions (Signed)
.  Medication Instructions:  START- Carvedilol 3.125 mg twice a day along with your 6.25 mg to make a total of 9.375 mg twice a day.  If you need a refill on your cardiac medications before your next appointment, please call your pharmacy.  Labwork: None Ordered   Testing/Procedures: None Ordered  Follow-Up: Your physician wants you to follow-up in: 1 Month.    Thank you for choosing [facility] at [facility]!!

## 2017-10-27 ENCOUNTER — Ambulatory Visit (INDEPENDENT_AMBULATORY_CARE_PROVIDER_SITE_OTHER)
Admission: RE | Admit: 2017-10-27 | Discharge: 2017-10-27 | Disposition: A | Discharge status: Home / Self Care | Payer: Medicare Other | Source: Ambulatory Visit | Attending: Pulmonary Disease | Admitting: Pulmonary Disease

## 2017-10-27 DIAGNOSIS — J90 Pleural effusion, not elsewhere classified: Secondary | ICD-10-CM

## 2017-10-28 ENCOUNTER — Ambulatory Visit: Payer: Medicare Other | Admitting: Cardiology

## 2017-11-03 ENCOUNTER — Ambulatory Visit (INDEPENDENT_AMBULATORY_CARE_PROVIDER_SITE_OTHER): Payer: Medicare Other | Admitting: Acute Care

## 2017-11-03 ENCOUNTER — Encounter: Payer: Self-pay | Admitting: Gastroenterology

## 2017-11-03 ENCOUNTER — Encounter: Payer: Self-pay | Admitting: Acute Care

## 2017-11-03 VITALS — BP 96/60 | HR 87 | Ht 65.5 in | Wt 156.0 lb

## 2017-11-03 DIAGNOSIS — K7581 Nonalcoholic steatohepatitis (NASH): Secondary | ICD-10-CM | POA: Diagnosis not present

## 2017-11-03 DIAGNOSIS — J90 Pleural effusion, not elsewhere classified: Secondary | ICD-10-CM

## 2017-11-03 NOTE — Assessment & Plan Note (Signed)
Pleurex in place Continue to require drainage once weekly for 500-550 ccs CT shows ? Cirrhosis Plan: Continue intermittent drainage for now Continue heart failure therapy We will send a referral for GI >> Evaluation for NASH and Hepatic Hydrothorax as contributor to pleural effusion We will not remove the drain until we have ruled out liver as a possible contributing cause. If your liver is not contributing to the cause we will refer you to Thoracic surgery for evaluation . Continue your plaquenil as you have been doing. Wear your oxygen as needed. Saturation goals are > 94% Incentive Spirometer as able  Follow up with Dr. Halford Chessman or Judson Roch after GI has evaluated you. Please contact office for sooner follow up if symptoms do not improve or worsen or seek emergency care
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
## 2017-11-03 NOTE — Patient Instructions (Addendum)
It is nice to meet you today. Continue intermittent drainage for now Continue heart failure therapy We will send a referral for GI >> Evaluation for NASH and Hepatic Hydrothorax as contributor to pleural effusion We will not remove the drain until we have ruled out liver as a possible contributing cause. If your liver is not contributing to the cause we will refer you to Thoracic surgery for evaluation . Continue your plaquenil as you have been doing. Wear your oxygen as needed. Saturation goals are > 94% Incentive Spirometer as able  Follow up with Dr. Halford Chessman or Judson Roch after GI has evaluated you. Please contact office for sooner follow up if symptoms do not improve or worsen or seek emergency care

## 2017-12-16 ENCOUNTER — Ambulatory Visit (INDEPENDENT_AMBULATORY_CARE_PROVIDER_SITE_OTHER): Payer: Medicare Other | Admitting: Gastroenterology

## 2017-12-16 ENCOUNTER — Encounter: Payer: Self-pay | Admitting: Gastroenterology

## 2017-12-16 ENCOUNTER — Other Ambulatory Visit (INDEPENDENT_AMBULATORY_CARE_PROVIDER_SITE_OTHER): Payer: Medicare Other

## 2017-12-16 VITALS — BP 86/60 | HR 76

## 2017-12-16 DIAGNOSIS — K7460 Unspecified cirrhosis of liver: Secondary | ICD-10-CM

## 2017-12-16 DIAGNOSIS — K760 Fatty (change of) liver, not elsewhere classified: Secondary | ICD-10-CM | POA: Diagnosis not present

## 2017-12-16 DIAGNOSIS — J90 Pleural effusion, not elsewhere classified: Secondary | ICD-10-CM

## 2017-12-16 DIAGNOSIS — R188 Other ascites: Secondary | ICD-10-CM | POA: Diagnosis not present

## 2017-12-16 LAB — CBC WITH DIFFERENTIAL/PLATELET
Basophils Absolute: 0 10*3/uL (ref 0.0–0.1)
Basophils Relative: 0.6 % (ref 0.0–3.0)
EOS ABS: 0 10*3/uL (ref 0.0–0.7)
EOS PCT: 0.2 % (ref 0.0–5.0)
HCT: 39.4 % (ref 36.0–46.0)
Hemoglobin: 12.4 g/dL (ref 12.0–15.0)
LYMPHS ABS: 0.6 10*3/uL — AB (ref 0.7–4.0)
Lymphocytes Relative: 6.5 % — ABNORMAL LOW (ref 12.0–46.0)
MCHC: 31.5 g/dL (ref 30.0–36.0)
MCV: 76.5 fl — ABNORMAL LOW (ref 78.0–100.0)
MONO ABS: 0.3 10*3/uL (ref 0.1–1.0)
Monocytes Relative: 2.9 % — ABNORMAL LOW (ref 3.0–12.0)
Neutro Abs: 7.7 10*3/uL (ref 1.4–7.7)
Platelets: 149 10*3/uL — ABNORMAL LOW (ref 150.0–400.0)
RBC: 5.15 Mil/uL — ABNORMAL HIGH (ref 3.87–5.11)
RDW: 16.3 % — ABNORMAL HIGH (ref 11.5–15.5)
WBC: 8.6 10*3/uL (ref 4.0–10.5)

## 2017-12-16 LAB — PROTIME-INR
INR: 1.2 ratio — AB (ref 0.8–1.0)
PROTHROMBIN TIME: 14.1 s — AB (ref 9.6–13.1)

## 2017-12-16 LAB — COMPREHENSIVE METABOLIC PANEL
ALT: 31 U/L (ref 0–35)
AST: 23 U/L (ref 0–37)
Albumin: 2.9 g/dL — ABNORMAL LOW (ref 3.5–5.2)
Alkaline Phosphatase: 84 U/L (ref 39–117)
BUN: 13 mg/dL (ref 6–23)
CHLORIDE: 103 meq/L (ref 96–112)
CO2: 27 mEq/L (ref 19–32)
CREATININE: 0.63 mg/dL (ref 0.40–1.20)
Calcium: 8.5 mg/dL (ref 8.4–10.5)
GFR: 121.08 mL/min (ref >60.00)
GLUCOSE: 139 mg/dL — AB (ref 70–99)
POTASSIUM: 3.9 meq/L (ref 3.5–5.1)
SODIUM: 137 meq/L (ref 135–145)
Total Bilirubin: 0.7 mg/dL (ref 0.2–1.2)
Total Protein: 6.8 g/dL (ref 6.0–8.3)

## 2017-12-16 LAB — AMMONIA: Ammonia: 44 umol/L — ABNORMAL HIGH (ref 11–35)

## 2017-12-16 LAB — FERRITIN: Ferritin: 36.3 ng/mL (ref 10.0–291.0)
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
## 2017-12-16 NOTE — Patient Instructions (Addendum)
If you are age 67 or older, your body mass index should be between 23-30. Your There is no height or weight on file to calculate BMI. If this is out of the aforementioned range listed, please consider follow up with your Primary Care Provider.  If you are age 53 or younger, your body mass index should be between 19-25. Your There is no height or weight on file to calculate BMI. If this is out of the aformentioned range listed, please consider follow up with your Primary Care Provider.   Please go to the lab in the basement of our building to have lab work done as you leave today.  You have been scheduled for Paracentesis at [facility] (1st floor of hospital - Admitting) on Tuesday, May 28th at 10:00am. Please arrive 15 minutes prior to your appointment for registration. Make certain not to have anything to eat or drink 6 hours prior to your appointment. Should you need to reschedule your appointment, please contact radiology at [phone].   Thank you for entrusting me with your care and for choosing [facility], Dr. [place] Cellar

## 2017-12-17 ENCOUNTER — Telehealth: Payer: Self-pay

## 2017-12-17 LAB — IRON AND TIBC
IRON SATURATION: 21 % (ref 15–55)
Iron: 61 ug/dL (ref 27–139)
Total Iron Binding Capacity: 297 ug/dL (ref 250–450)
UIBC: 236 ug/dL (ref 118–369)

## 2017-12-17 NOTE — Telephone Encounter (Signed)
When pt was in the office and paracentesis was scheduled, her instructions erroneously said NPO 6 hours.  Faxed note to Nursing home, [place] that pt does NOT need to be NPO at all for the procedure.

## 2017-12-17 NOTE — Telephone Encounter (Signed)
Faxed results and Dr. Doyne Keel recommendations to [facility] at fax: [phone].

## 2017-12-21 ENCOUNTER — Ambulatory Visit (HOSPITAL_COMMUNITY)
Admission: RE | Admit: 2017-12-21 | Discharge: 2017-12-21 | Disposition: A | Discharge status: Home / Self Care | Payer: Medicare Other | Source: Ambulatory Visit | Attending: Gastroenterology | Admitting: Gastroenterology

## 2017-12-21 ENCOUNTER — Telehealth: Payer: Self-pay

## 2017-12-21 ENCOUNTER — Other Ambulatory Visit: Payer: Self-pay | Admitting: Gastroenterology

## 2017-12-21 DIAGNOSIS — I700 Atherosclerosis of aorta: Secondary | ICD-10-CM | POA: Diagnosis not present

## 2017-12-21 DIAGNOSIS — J90 Pleural effusion, not elsewhere classified: Secondary | ICD-10-CM | POA: Insufficient documentation

## 2017-12-21 DIAGNOSIS — K7460 Unspecified cirrhosis of liver: Secondary | ICD-10-CM | POA: Diagnosis present

## 2017-12-21 DIAGNOSIS — Z9049 Acquired absence of other specified parts of digestive tract: Secondary | ICD-10-CM | POA: Diagnosis not present

## 2017-12-21 DIAGNOSIS — R188 Other ascites: Secondary | ICD-10-CM | POA: Insufficient documentation

## 2017-12-21 LAB — HEPATITIS B SURFACE ANTIGEN: HEP B S AG: NONREACTIVE

## 2017-12-21 LAB — HEPATITIS C ANTIBODY
HEP C AB: REACTIVE — AB
SIGNAL TO CUT-OFF: 30.1 — AB (ref <1.00)

## 2017-12-21 LAB — ANA: Anti Nuclear Antibody(ANA): NEGATIVE

## 2017-12-21 LAB — HCV RNA,QUANTITATIVE REAL TIME PCR
HCV Quantitative Log: 5.72 Log IU/mL — ABNORMAL HIGH
HCV RNA, PCR, QN: 522000 IU/mL — ABNORMAL HIGH

## 2017-12-21 LAB — MITOCHONDRIAL ANTIBODIES: Mitochondrial M2 Ab, IgG: 20.1 U — ABNORMAL HIGH

## 2017-12-21 LAB — ALPHA-1-ANTITRYPSIN: A-1 Antitrypsin, Ser: 190 mg/dL (ref 83–199)

## 2017-12-21 LAB — IGG: IgG (Immunoglobin G), Serum: 1845 mg/dL — ABNORMAL HIGH (ref 694–1618)

## 2017-12-21 LAB — HEPATITIS A ANTIBODY, TOTAL: HEPATITIS A AB,TOTAL: NONREACTIVE

## 2017-12-21 LAB — ANTI-SMOOTH MUSCLE ANTIBODY, IGG

## 2017-12-21 LAB — HEPATITIS B SURFACE ANTIBODY,QUALITATIVE: Hep B S Ab: NONREACTIVE

## 2017-12-21 MED ORDER — LIDOCAINE HCL (PF) 2 % IJ SOLN
INTRAMUSCULAR | Status: AC
  Filled 2017-12-21: qty 20

## 2017-12-21 NOTE — Progress Notes (Signed)
IR requested by Dr. Havery Moros for possible image-guided paracentesis.  US abdomen limited revealed no fluid safely accessible for paracentesis. Images reviewed with Dr. Corrie Mckusick. Procedure will not be performed today.   Discussed results with patient. All questions answered and concerns addressed. Patient conveys understanding and agrees with plan.  Bea Graff Cashae Weich, PA-C 12/21/2017, 10:52 AM

## 2017-12-21 NOTE — Telephone Encounter (Signed)
Faxed order for lab work to be done this week: BMET, Hep C RNA and Hep C genotype. Patient also needs to receive immunization for hepatitis A&B. Faxed to new #: [phone].

## 2017-12-23 NOTE — Progress Notes (Signed)
Cardiology Office Note   Date:  12/24/2017   ID:  [name], DOB [birth year], MRN [id number]  PCP:  Salome Arnt, [place]  Cardiologist:   Minus Breeding, MD   Chief Complaint  Patient presents with  . Pleural Effusion      History of Present Illness: [name] is a 67 y.o. female who presents for follow up of congestive heart failure.  She was in the hospital in Dec 2018.   In 2016 she had a normal EF.  However, during the recent hospitalization she had an EF of 20%. Cath demonstrated normal coronaries.  She had thoracentesis of 1.2 liters on the left.  She went back to the nursing home where she lives but required another thoracentesis.  This one was on the right.  She had a Pleurex catheter placed.  She was in [facility] for that event in January of this year.  She returns for follow-up.  Since I last saw her she saw Dr. Enis Gash and a possibility of cirrhosis is being considered.  She was to be treated with spironolactone.     Since I last saw her she is done relatively well.  She says she is draining about 425 or so cc every 3 days or so.  She had some good days where she feels like getting out of bed and is doing relatively well and other days where she is just not motivated to get up but does not describe any overt shortness of breath.  She is had some increased lower extremity swelling.  She has a cough that was described previously but this is been better over the last few days.  It was a question of some ascites so she was sent for an abdominal ultrasound yesterday and I reviewed this there was no evidence of ascites.   Past Medical History:  Diagnosis Date  . Acute systolic CHF (congestive heart failure) ([place]) 06/28/2017   EF was normal 2016, now 20% with grade 2 diastolic dysfunction, no significant CAD at cath  . Anemia 10/12/2008  . CKD (chronic kidney disease)   . Depression   . Diaphragmatic hernia without mention of obstruction or gangrene  08/29/2010  . Edema 05/20/2009  . Gastroparesis 10/05/2010  . GERD (gastroesophageal reflux disease) 02/02/2009  . Hx of colonic polyps   . Hypertension 10/12/2008  . Hypotension, unspecified 05/20/2009  . Immobility syndrome (paraplegic) 1977  . Iron deficiency anemia, unspecified 10/12/2008  . Leiomyoma of uterus   . Malignant neoplasm of breast (female), unspecified site 10/05/2010   2003, 2013   . Paraplegia ([place])   . Pleural effusion   . Pneumonia   . Respiratory failure ([place])   . Rheumatoid arthritis ([place]) 01/17/2009    Past Surgical History:  Procedure Laterality Date  . [place]  . BREAST SURGERY  2003   Bilateral mastectomy  . IR FLUORO GUIDE CV LINE RIGHT  06/23/2017  . IR REMOVAL TUN CV CATH W/O FL  07/15/2017  . IR THORACENTESIS ASP PLEURAL SPACE W/IMG GUIDE  07/30/2017  . IR US GUIDE VASC ACCESS RIGHT  06/23/2017  . PRESSURE ULCER DEBRIDEMENT  2009   on back  . RIGHT/LEFT HEART CATH AND CORONARY ANGIOGRAPHY N/A 07/02/2017   Procedure: RIGHT/LEFT HEART CATH AND CORONARY ANGIOGRAPHY;  Surgeon: Martinique, Peter M, MD;  Location: [facility];  Service: Cardiovascular;  Laterality: N/A;  . WOUND DEBRIDEMENT  09/02/2008   Large  sacral back open wound     Current Outpatient Medications  Medication Sig Dispense Refill  . acetaminophen (TYLENOL) 325 MG tablet Take 650 mg by mouth every 6 (six) hours as needed.    . baclofen (LIORESAL) 10 MG tablet Take 10 mg by mouth 3 (three) times daily as needed.    . bisacodyl (DULCOLAX) 10 MG suppository Place 10 mg rectally every other day.     . calcium-vitamin D (OYSTER CALCIUM 500 + D) 500-200 MG-UNIT per tablet Take 1 tablet by mouth 2 (two) times daily.     . carvedilol (COREG) 3.125 MG tablet Take 1 tablet (3.125 mg total) by mouth 2 (two) times daily. 180 tablet 3  . carvedilol (COREG) 6.25 MG tablet Take 1 tablet (6.25 mg total) by mouth 2 (two) times daily with a meal. 180 tablet 3  .  cetirizine (ZYRTEC ALLERGY) 10 MG tablet Take 10 mg by mouth daily as needed for allergies.     . cholecalciferol (VITAMIN D) 1000 units tablet Take 1,000 Units by mouth daily.    . Cranberry 425 MG CAPS Take 425 mg by mouth daily.    . diclofenac sodium (VOLTAREN) 1 % GEL Apply 3 g topically 4 (four) times daily.    . diphenhydrAMINE (BENADRYL) 25 MG tablet Take 25 mg by mouth every 8 (eight) hours as needed for itching.    . escitalopram (LEXAPRO) 5 MG tablet Take 5 mg by mouth daily.     . furosemide (LASIX) 40 MG tablet Take 40 mg by mouth daily.     . hydrocortisone 2.5 % cream Apply 1 application topically 2 (two) times daily. To rash    . hydroxychloroquine (PLAQUENIL) 200 MG tablet Take 100 mg by mouth daily.     Marland Kitchen losartan (COZAAR) 25 MG tablet Take 25 mg by mouth daily.    . methocarbamol (ROBAXIN) 750 MG tablet Take 750 mg by mouth 2 (two) times daily.     . metoCLOPramide (REGLAN) 5 MG tablet Take 5 mg by mouth 3 (three) times daily.    . mirtazapine (REMERON) 7.5 MG tablet Take by mouth.    . Multiple Vitamins-Minerals (DECUBI-VITE) CAPS Take 1 capsule by mouth daily.    Marland Kitchen omega-3 fish oil (MAXEPA) 1000 MG CAPS capsule Take by mouth.    Marland Kitchen omeprazole (PRILOSEC) 20 MG capsule Take 20 mg by mouth daily.    Marland Kitchen oxybutynin (DITROPAN) 5 MG tablet Take 5 mg by mouth 2 (two) times daily.    . promethazine (PHENERGAN) 25 MG tablet Take 25 mg by mouth every 6 (six) hours as needed for nausea or vomiting.    . sennosides-docusate sodium (SENOKOT-S) 8.6-50 MG tablet Take 2 tablets by mouth at bedtime.    . traMADol (ULTRAM) 50 MG tablet Take 50 mg by mouth 4 (four) times daily.    . fluticasone (FLONASE) 50 MCG/ACT nasal spray Place 2 sprays into both nostrils daily as needed for allergies.     . Lidocaine (LIDODERM EX) Apply topically. Apply to right shoulder topically every 12 hours for pain. On in AM and off in PM    . spironolactone (ALDACTONE) 25 MG tablet Take 1 tablet (25 mg total) by  mouth daily. 90 tablet 3   No current facility-administered medications for this visit.     Allergies:   Patient has no known allergies.     ROS:  Please see the history of present illness.   Otherwise, review of systems are positive NONE.  All other systems are reviewed and negative.    PHYSICAL EXAM: BP 94/69   Pulse 86    PHYSICAL EXAM GEN:  No distress NECK:  No jugular venous distention at 90 degrees, waveform within normal limits, carotid upstroke brisk and symmetric, no bruits, no thyromegaly LYMPHATICS:  No cervical adenopathy LUNGS:  Clear to auscultation bilaterally BACK:  No CVA tenderness CHEST:  Unremarkable, right anterior Pleurx catheter HEART:  S1 and S2 within normal limits, no S3, no S4, no clicks, no rubs, 3 out of 6 systolic murmur heard at the apex and radiating slightly at the axilla, no diastolic murmurs ABD:  Positive bowel sounds normal in frequency in pitch, no bruits, no rebound, no guarding, unable to assess midline mass or bruit with the patient seated. EXT:  2 plus pulses throughout, moderate edema, no cyanosis no clubbing SKIN:  No rashes no nodules NEURO:  Paralysis PSYCH:  Cognitively intact, oriented to person place and time    EKG:  EKG is not ordered today.    Recent Labs: 07/01/2017: B Natriuretic Peptide 296.5 12/16/2017: ALT 31; BUN 13; Creatinine, Ser 0.63; Hemoglobin 12.4; Platelets 149.0; Potassium 3.9; Sodium 137    Lipid Panel    Component Value Date/Time   CHOL 130 07/02/2017 0430   TRIG 102 07/02/2017 0430   HDL 33 (L) 07/02/2017 0430   CHOLHDL 3.9 07/02/2017 0430   VLDL 20 07/02/2017 0430   LDLCALC 77 07/02/2017 0430      Wt Readings from Last 3 Encounters:  11/03/17 156 lb (70.8 kg)  10/26/17 156 lb (70.8 kg)  08/25/17 156 lb (70.8 kg)      Other studies Reviewed: Additional studies/ records that were reviewed today include:   GI records.      ASSESSMENT AND PLAN:   Pleural effusion This may be related  to cirrhosis.  She does have edema and reduced ejection fraction but I am not convinced that this is related to the left-sided heart failure.  I did send a message to Dr. Havery Moros as he had suggested starting Spironolactone.  This might be difficult with her blood pressure but I am going to try 25 mg daily.  I will follow guidelines for spironolactone follow up in heart failure.  (Check potassium levels and  renal function 3--4 days and 1 week, then at least monthly for first 3 months and every 3 months thereafter after initiation of spironolactone.)  NICM (nonischemic cardiomyopathy) ([place]) She did not have CAD.   EF 20-25% by echo Dec 2018.   I will start the spiro as above.  Paraplegia [facility]) She gets around in a motorized wheelchair.   Cough I think this is related to the pleural effusion.  It is slightly better.  I did send a message to Dr. Halford Chessman about this previously.  I will defer further to him.    Current medicines are reviewed at length with the patient today.  The patient does not have concerns regarding medicines.  The following changes have been made:   As above.   Labs/ tests ordered today include:     Disposition:   FU with me in 3 months.   Signed, Minus Breeding, MD  12/24/2017 1:36 PM    [facility]

## 2017-12-24 ENCOUNTER — Other Ambulatory Visit: Payer: Self-pay

## 2017-12-24 ENCOUNTER — Encounter: Payer: Self-pay | Admitting: Cardiology

## 2017-12-24 ENCOUNTER — Ambulatory Visit (INDEPENDENT_AMBULATORY_CARE_PROVIDER_SITE_OTHER): Payer: Medicare Other | Admitting: Cardiology

## 2017-12-24 VITALS — BP 94/69 | HR 86

## 2017-12-24 DIAGNOSIS — J90 Pleural effusion, not elsewhere classified: Secondary | ICD-10-CM

## 2017-12-24 DIAGNOSIS — I5021 Acute systolic (congestive) heart failure: Secondary | ICD-10-CM | POA: Diagnosis not present

## 2017-12-24 DIAGNOSIS — B1920 Unspecified viral hepatitis C without hepatic coma: Secondary | ICD-10-CM

## 2017-12-24 MED ORDER — SPIRONOLACTONE 25 MG PO TABS: 25.0000 mg | ORAL_TABLET | Freq: Every day | ORAL | 3 refills | Status: DC

## 2017-12-24 NOTE — Patient Instructions (Signed)
Medication Instructions:  START- Spironolactone 25 mg daily  If you need a refill on your cardiac medications before your next appointment, please call your pharmacy.  Labwork: Get BMP in 3-4 day, then 1 week after that, then once a month for 3 months   Testing/Procedures: None Ordered  Follow-Up: Your physician wants you to follow-up in: 3 Months.     Thank you for choosing [facility] at [facility]!!

## 2017-12-27 ENCOUNTER — Encounter: Payer: Self-pay | Admitting: Pulmonary Disease

## 2017-12-27 ENCOUNTER — Ambulatory Visit (INDEPENDENT_AMBULATORY_CARE_PROVIDER_SITE_OTHER): Payer: Medicare Other | Admitting: Pulmonary Disease

## 2017-12-27 ENCOUNTER — Telehealth: Payer: Self-pay

## 2017-12-27 VITALS — BP 110/80 | HR 76 | Ht 65.0 in

## 2017-12-27 DIAGNOSIS — J90 Pleural effusion, not elsewhere classified: Secondary | ICD-10-CM | POA: Diagnosis not present

## 2017-12-27 NOTE — Telephone Encounter (Signed)
Spoke to [place], LPN, she received our faxed order to change the lasix and aldactone, repeat BMET in 2 weeks. Darrick Penna confirms that patient had been on the lasix 40 mg daily and aldactone 100 mg daily, let her know that the [facility] sent to patient's visit with Dr. Percival Spanish was not up to date. Instructed her to have Mr. Tamala [name], [place] review, make sure BP is stable enough for these changes that Dr. Havery Moros is recommending: lasix 40 mg BID and aldactone 200 mg /day.

## 2017-12-27 NOTE — Progress Notes (Signed)
[facility]  Chief Complaint  Patient presents with  . Follow-up    had abdominal Ultrasound 5/28    Vital signs: BP 110/80   Pulse 76   Ht 5' 5"  (1.651 m)   SpO2 98%   BMI 25.96 kg/m   History of Present Illness: [name] is a 67 y.o. female with pleural effusion.  Since her last visit with me she as been seen by Dr. Havery Moros with GI and Dr. Percival Spanish with cardiology.  She is scheduled for appointment with ID to assess for Hepatitis C therapy.  She still has pleurx in.  She had pleural fluid drained this morning.  Gets about 350 to 400 ml drained several times per week.  No having cough, wheeze, chest pain.  She is here with her sister.  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  She has Hep C also.  Recent CT chest showed changes of emphysema.  She has pleurx catheter in place and continues to get fluid drained. Assessment/Plan:  Transudate right pleural effusion s/p pleurx catheter. - continue intermittent fluid drainage for now - optimize tx for cirrhosis and CHF - at some point the catheter will need to come out - discussed pleurodesis, but that this is not good option in this setting of pleural effusion  Cirrhosis with Hep C positive. - f/u with GI - she is schedule for assessment with ID  Systolic CHF. - f/u with cardiology  History of rheumatoid arthritis. - she is on plaquenil   Patient Instructions  Follow up in 6 to 8 weeks    Chesley Mires, MD [place] 12/27/2017, 5:17 PM Pager:  [phone]  Flow Sheet  Pulmonary  tests: CT chest 06/28/17 >> large Rt pleural effusion Rt thoracentesis 06/29/17 >> glucose 94, LDH 70, protein 3.4, WBC 449 Rt thoracentesis 08/07/17 >> LDH 122, protein 3.4 CT chest 10/28/17 >> Rt base ATX, centrilobular and paraseptal emphysema, 3 mm nodule LUL, changes of cirrhosis  Cardiac tests: Echo 06/29/17 >> EF 20 to 25%, grade 2 DD, mod MR, PAS 51 mmHg  Past Medical History: She  has a past medical history of Acute systolic CHF (congestive heart failure) ([place]) (06/28/2017), Anemia (10/12/2008), CKD (chronic kidney disease), Depression, Diaphragmatic hernia without mention of obstruction or gangrene (08/29/2010), Edema (05/20/2009), Gastroparesis (10/05/2010), GERD (gastroesophageal reflux disease) (02/02/2009), colonic polyps, Hypertension (10/12/2008), Hypotension, unspecified (05/20/2009), Immobility syndrome (paraplegic) (1977), Iron deficiency anemia, unspecified (10/12/2008), Leiomyoma of uterus, Malignant neoplasm of breast (female), unspecified site (10/05/2010), Paraplegia ([place]), Pleural effusion, Pneumonia, Respiratory failure ([place]), and Rheumatoid arthritis ([place]) (01/17/2009).  Past Surgical History: She  has a past surgical history that includes Breast surgery (2003); Wound debridement (09/02/2008); Pressure ulcer debridement (2009); Back surgery; IR US Guide Vasc Access Right (06/23/2017); IR Fluoro Guide CV Line Right (06/23/2017); RIGHT/LEFT HEART CATH AND CORONARY ANGIOGRAPHY (N/A, 07/02/2017); IR Removal Tun Cv Cath W/O FL (07/15/2017); and IR THORACENTESIS ASP PLEURAL SPACE W/IMG GUIDE (07/30/2017).  Family History: Her family history includes Colon cancer in her maternal aunt; Hypertension in her maternal aunt, maternal uncle, and mother; Liver disease in her brother  and brother; Prostate cancer in her maternal uncle; Stroke in her mother.  Social History: She  reports that she quit smoking about 22 years ago. She has a 10.00 pack-year smoking history. She has never used  smokeless tobacco. She reports that she does not drink alcohol or use drugs.  Medications: Allergies as of 12/27/2017   No Known Allergies     Medication List        Accurate as of 12/27/17  5:17 PM. Always use your most recent med list.          acetaminophen 325 MG tablet Commonly known as:  TYLENOL Take 650 mg by mouth every 6 (six) hours as needed.   baclofen 10 MG tablet Commonly known as:  LIORESAL Take 10 mg by mouth 3 (three) times daily as needed.   bisacodyl 10 MG suppository Commonly known as:  DULCOLAX Place 10 mg rectally every other day.   carvedilol 6.25 MG tablet Commonly known as:  COREG Take 1 tablet (6.25 mg total) by mouth 2 (two) times daily with a meal.   carvedilol 3.125 MG tablet Commonly known as:  COREG Take 1 tablet (3.125 mg total) by mouth 2 (two) times daily.   cholecalciferol 1000 units tablet Commonly known as:  VITAMIN D Take 1,000 Units by mouth daily.   Cranberry 425 MG Caps Take 425 mg by mouth daily.   DECUBI-VITE Caps Take 1 capsule by mouth daily.   diclofenac sodium 1 % Gel Commonly known as:  VOLTAREN Apply 3 g topically 4 (four) times daily.   diphenhydrAMINE 25 MG tablet Commonly known as:  BENADRYL Take 25 mg by mouth every 8 (eight) hours as needed for itching.   escitalopram 5 MG tablet Commonly known as:  LEXAPRO Take 5 mg by mouth daily.   fluticasone 50 MCG/ACT nasal spray Commonly known as:  FLONASE Place 2 sprays into both nostrils daily as needed for allergies.   furosemide 40 MG tablet Commonly known as:  LASIX Take 40 mg by mouth daily.   hydrocortisone 2.5 % cream Apply 1 application topically 2 (two) times daily. To rash   hydroxychloroquine 200 MG tablet Commonly known as:  PLAQUENIL Take 100 mg by mouth daily.   LIDODERM EX Apply topically. Apply to right shoulder topically every 12 hours for pain. On in AM and off in PM   losartan 25 MG tablet Commonly known as:  COZAAR Take 25 mg by  mouth daily.   methocarbamol 750 MG tablet Commonly known as:  ROBAXIN Take 750 mg by mouth 2 (two) times daily.   metoCLOPramide 5 MG tablet Commonly known as:  REGLAN Take 5 mg by mouth 3 (three) times daily.   mirtazapine 7.5 MG tablet Commonly known as:  REMERON Take by mouth.   omega-3 fish oil 1000 MG Caps capsule Commonly known as:  MAXEPA Take by mouth.   omeprazole 20 MG capsule Commonly known as:  PRILOSEC Take 20 mg by mouth daily.   oxybutynin 5 MG tablet Commonly known as:  DITROPAN Take 5 mg by mouth 2 (two) times daily.   OYSTER CALCIUM 500 + D 500-200 MG-UNIT tablet Generic drug:  calcium-vitamin D Take 1 tablet by mouth 2 (two) times daily.   promethazine 25 MG tablet Commonly known as:  PHENERGAN Take 25 mg by mouth every 6 (six) hours as needed for nausea or vomiting.   sennosides-docusate sodium 8.6-50 MG tablet Commonly known as:  SENOKOT-S Take 2 tablets by mouth at bedtime.  spironolactone 25 MG tablet Commonly known as:  ALDACTONE Take 1 tablet (25 mg total) by mouth daily.   traMADol 50 MG tablet Commonly known as:  ULTRAM Take 50 mg by mouth 4 (four) times daily.   ZYRTEC ALLERGY 10 MG tablet Generic drug:  cetirizine Take 10 mg by mouth daily as needed for allergies.

## 2017-12-27 NOTE — Patient Instructions (Signed)
Follow up in 6 to 8 weeks

## 2017-12-27 NOTE — Telephone Encounter (Signed)
Thanks Almyra Free I really appreciate you looking into this.  Hopefully she tolerates higher dosing of diuretics and this helps her effusion. Will await repeat BMET in 2 weeks.

## 2017-12-27 NOTE — Telephone Encounter (Signed)
-----   Message from Yetta Flock, MD sent at 12/27/2017 12:43 PM EDT ----- [name] can you help touch base with this patient's family regarding the following issue:  I had placed her on aldactone 100mg  and then recently recommended she increase to 200mg  / day. Dr. Percival Spanish saw her, it was not on her med list and recommended she start it at 25mg  / day.   I want to make sure she has been on this, and I think she has been but want to confirm. Can you help clarify? Thanks  ----- Message ----- From: Minus Breeding, MD Sent: 12/27/2017   8:17 AM To: Yetta Flock, MD  [name] was not on her med list from the nursing home.  I started at only 25 mg of spiro which is what we use in HF.  If you want her to be on more you might need to check with the NH and see what happened to your original order.  Thanks.  [name]  ----- Message ----- From: Yetta Flock, MD Sent: 12/27/2017   7:46 AM To: Minus Breeding, MD  Hi, Thanks for the message. I think she does have cirrhosis from hepatitis C which we recently diagnosed. Unclear if her effusion is from the cirrhosis or the heart failure. She doesn't have any ascites.  I had placed her on aldactone 100mg  per day in addition to the lasix 40mg . We repeated her renal function and things were stable so we discussed increasing her lasix to 40mg  BID and aldactone to 200mg  / day if her BP allowed it.   If the effusion is due to cirrhosis, Pleurex catheter is not a great long term solution, they tend to get infected, was hoping to maximize her diuretics if possible to reduce this effusion with the hopes of pulling the catheter at some point in time.   If you think her BP can't handle the diuretics let me know, difficult situation. Thanks for the message.  [name]  ----- Message ----- From: Minus Breeding, MD Sent: 12/24/2017   1:09 PM To: Yetta Flock, MD  Hi,  Did you want her on spironolactone?  Thanks.  [name]

## 2018-01-10 ENCOUNTER — Encounter: Payer: Medicare Other | Admitting: Internal Medicine

## 2018-01-10 ENCOUNTER — Encounter: Payer: Self-pay | Admitting: Internal Medicine

## 2018-01-10 ENCOUNTER — Ambulatory Visit (INDEPENDENT_AMBULATORY_CARE_PROVIDER_SITE_OTHER): Payer: Medicare Other | Admitting: Internal Medicine

## 2018-01-10 DIAGNOSIS — B182 Chronic viral hepatitis C: Secondary | ICD-10-CM | POA: Diagnosis not present

## 2018-01-10 MED ORDER — LEDIPASVIR-SOFOSBUVIR 90-400 MG PO TABS: 1.0000 | ORAL_TABLET | Freq: Every day | ORAL | 2 refills | Status: DC

## 2018-01-10 MED ORDER — RIBAVIRIN 200 MG PO TABS: 600.0000 mg/d | ORAL_TABLET | Freq: Every day | ORAL | 2 refills | Status: DC

## 2018-01-10 NOTE — Patient Instructions (Signed)
Date 01/10/18  Dear [name], As discussed in the [facility], your hepatitis C therapy will include the following medications:          Harvoni 55m/400mg tablet:           Take 1 tablet by mouth once daily                              Plus Ribavirin 600 mg once daily  Please note that ALL MEDICATIONS WILL START ON THE SAME DATE for a total of 12 weeks. ---------------------------------------------------------------- Your HCV Treatment Start Date: TBA   Your HCV genotype:  1a    Liver Fibrosis: cirrhosis   ---------------------------------------------------------------- YOUR [facility]AC[facility] CONTACT:   [facility] [place] [id number]: [phone]: Monday to Friday 7:30 am to 6:00 pm   Please always contact your pharmacy at least 3-4 business days before you run out of medications to ensure your next month's medication is ready or 1 week prior to running out if you receive it by mail.  Remember, each prescription is for 28 days. ---------------------------------------------------------------- GENERAL NOTES REGARDING YOUR HEPATITIS C MEDICATION:  SOFOSBUVIR/LEDIPASVIR (HARVONI): - Harvoni tablet is taken daily with OR without food. - The tablets are orange. - The tablets should be stored at room temperature.  - Acid reducing agents such as H2 blockers (ie. Pepcid (famotidine), Zantac (ranitidine), Tagamet (cimetidine), Axid (nizatidine) and proton pump inhibitors (ie. Prilosec (omeprazole), Protonix (pantoprazole), Nexium (esomeprazole), or Aciphex (rabeprazole)) can decrease effectiveness of Harvoni. Do not take until you have discussed with a health care provider.    -Antacids that contain magnesium and/or aluminum hydroxide (ie. Milk of [place]ia, Rolaids, Gaviscon, Maalox, Mylanta, an dArthritis Pain Formula)can reduce absorption of Harvoni, so take them at least 4 hours before or after Harvoni.  -Calcium carbonate (calcium  supplements or antacids such as Tums, Caltrate, Os-Cal)needs to be taken at least 4 hours hours before or after Harvoni.  -St. John's wort or any products that contain St. John's wort like some herbal supplements  Please inform the office prior to starting any of these medications.  - The common side effects associated with Harvoni include:      1. Fatigue      2. Headache      3. Nausea      4. Diarrhea      5. Insomnia  Please note that this only lists the most common side effects and is NOT a comprehensive list of the potential side effects of these medications. For more information, please review the drug information sheets that come with your medication package from the pharmacy.  ---------------------------------------------------------------- GENERAL HELPFUL HINTS ON HCV THERAPY: 1. Stay well-hydrated. 2. Notify the ID Clinic of any changes in your other over-the-counter/herbal or prescription medications. 3. If you miss a dose of your medication, take the missed dose as soon as you remember. Return to your regular time/dose schedule the next day.  4.  Do not stop taking your medications without first talking with your healthcare provider. 5.  You may take Tylenol (acetaminophen), as long as the dose is less than 2000 mg (OR no more than 4 tablets of the Tylenol Extra Strengths 5060mtablet) in 24 hours. 6.  You will see our pharmacist-specialist within the first 2 weeks of starting your medication to monitor for any possible side effects. 7.  You will have labs once during treatment, after soon after  treatment completion and one final lab 6 months after treatment completion to verify the virus is out of your system.  Thayer Headings, [place] [name] [place], [place]  [phone]

## 2018-01-10 NOTE — Progress Notes (Signed)
[facility]   CC: consideration for treatment for chronic hepatitis C  HPI:  +[name] is a 67 y.o. female who presents for initial evaluation and management of chronic hepatitis C.  Patient tested positive last month during evaluation for cirrhosis. Hepatitis C-associated risk factors present are: none. Patient denies history of blood transfusion, IV drug abuse. Patient has had other studies performed. Results: hepatitis C RNA by PCR, result: positive. Patient has not had prior treatment for Hepatitis C. Patient does have a past history of liver disease. Patient does not have a family history of liver disease. Patient does  have associated signs or symptoms related to liver disease.  Labs reviewed and confirm chronic hepatitis C with a positive viral load.   Records reviewed from Epic and she has genotype 1a, no baseline resistance, positive viral load, also with NASH.      Patient does not have documented immunity to Hepatitis A. Patient does not have documented immunity to Hepatitis B.  She is currently getting vaccines form her PCP.   Review of Systems:   Constitutional: negative for malaise and anorexia Gastrointestinal: negative for diarrhea Musculoskeletal: negative for myalgias and arthralgias All other systems reviewed and are negative       Past Medical History:  Diagnosis Date  . Acute systolic CHF (congestive heart failure) ([place]) 06/28/2017   EF was normal 2016, now 20% with grade 2 diastolic dysfunction, no significant CAD at cath  . Anemia 10/12/2008  . CKD (chronic kidney disease)   . Depression   . Diaphragmatic hernia without mention of obstruction or gangrene 08/29/2010  . Edema 05/20/2009  . Gastroparesis 10/05/2010  . GERD (gastroesophageal reflux disease) 02/02/2009  . Hx of colonic polyps   . Hypertension 10/12/2008  . Hypotension, unspecified 05/20/2009  . Immobility syndrome (paraplegic) 1977  . Iron deficiency  anemia, unspecified 10/12/2008  . Leiomyoma of uterus   . Malignant neoplasm of breast (female), unspecified site 10/05/2010   2003, 2013   . Paraplegia ([place])   . Pleural effusion   . Pneumonia   . Respiratory failure ([place])   . Rheumatoid arthritis ([place]) 01/17/2009    Prior to Admission medications   Medication Sig Start Date End Date Taking? Authorizing Provider  acetaminophen (TYLENOL) 325 MG tablet Take 650 mg by mouth every 6 (six) hours as needed.   Yes [provider]  baclofen (LIORESAL) 10 MG tablet Take 10 mg by mouth 3 (three) times daily as needed.   Yes [provider]  bisacodyl (DULCOLAX) 10 MG suppository Place 10 mg rectally every other day.    Yes [provider]  calcium-vitamin D (OYSTER CALCIUM 500 + D) 500-200 MG-UNIT per tablet Take 1 tablet by mouth 2 (two) times daily.    Yes [provider]  carvedilol (COREG) 3.125 MG tablet Take 1 tablet (3.125 mg total) by mouth 2 (two) times daily. 10/26/17 01/24/18 Yes Minus Breeding, MD  carvedilol (COREG) 6.25 MG tablet Take 1 tablet (6.25 mg total) by mouth 2 (two) times daily with a meal. 07/16/17  Yes Minus Breeding, MD  cetirizine (ZYRTEC ALLERGY) 10 MG tablet Take 10 mg by mouth daily as needed for allergies.    Yes [provider]  cholecalciferol (VITAMIN D) 1000 units tablet Take 1,000 Units by mouth daily.   Yes [provider]  Cranberry 425 MG CAPS Take 425 mg by mouth daily.   Yes [provider]  diclofenac sodium (VOLTAREN) 1 %  GEL Apply 3 g topically 4 (four) times daily.   Yes [provider]  diphenhydrAMINE (BENADRYL) 25 MG tablet Take 25 mg by mouth every 8 (eight) hours as needed for itching.   Yes [provider]  escitalopram (LEXAPRO) 5 MG tablet Take 5 mg by mouth daily.    Yes [provider]  fluticasone (FLONASE) 50 MCG/ACT nasal spray Place 2 sprays into both nostrils daily as needed for allergies.    Yes  [provider]  furosemide (LASIX) 40 MG tablet Take 40 mg by mouth daily.    Yes [provider]  hydrocortisone 2.5 % cream Apply 1 application topically 2 (two) times daily. To rash   Yes [provider]  hydroxychloroquine (PLAQUENIL) 200 MG tablet Take 100 mg by mouth daily.    Yes [provider]  Lidocaine (LIDODERM EX) Apply topically. Apply to right shoulder topically every 12 hours for pain. On in AM and off in PM   Yes [provider]  losartan (COZAAR) 25 MG tablet Take 25 mg by mouth daily.   Yes [provider]  methocarbamol (ROBAXIN) 750 MG tablet Take 750 mg by mouth 2 (two) times daily.    Yes [provider]  metoCLOPramide (REGLAN) 5 MG tablet Take 5 mg by mouth 3 (three) times daily.   Yes [provider]  mirtazapine (REMERON) 7.5 MG tablet Take by mouth.   Yes [provider]  Multiple Vitamins-Minerals (DECUBI-VITE) CAPS Take 1 capsule by mouth daily.   Yes [provider]  omega-3 fish oil (MAXEPA) 1000 MG CAPS capsule Take by mouth.   Yes [provider]  omeprazole (PRILOSEC) 20 MG capsule Take 20 mg by mouth daily.   Yes [provider]  oxybutynin (DITROPAN) 5 MG tablet Take 5 mg by mouth 2 (two) times daily.   Yes [provider]  promethazine (PHENERGAN) 25 MG tablet Take 25 mg by mouth every 6 (six) hours as needed for nausea or vomiting.   Yes [provider]  sennosides-docusate sodium (SENOKOT-S) 8.6-50 MG tablet Take 2 tablets by mouth at bedtime.   Yes [provider]  spironolactone (ALDACTONE) 25 MG tablet Take 1 tablet (25 mg total) by mouth daily. 12/24/17 03/24/18 Yes Minus Breeding, MD  traMADol (ULTRAM) 50 MG tablet Take 50 mg by mouth 4 (four) times daily.   Yes [provider]    No Known Allergies  Social History   Tobacco Use  . Smoking status: Former Smoker    Packs/day: 1.00    Years: 10.00    Pack  years: 10.00    Last attempt to quit: 07/28/1995    Years since quitting: 22.4  . Smokeless tobacco: Never Used  Substance Use Topics  . Alcohol use: No  . Drug use: No    Family History  Problem Relation Age of Onset  . Stroke Mother   . Hypertension Mother   . Hypertension Maternal Aunt   . Colon cancer Maternal Aunt   . Hypertension Maternal Uncle   . Liver disease Brother   . Prostate cancer Maternal Uncle   . Liver disease Brother      Objective:  Constitutional: in no apparent distress,  Eyes: anicteric Cardiovascular: Cor RRR Respiratory: CTA B; normal respiratory effort on O2 Gastrointestinal: Bowel sounds are normal Musculoskeletal: no pedal edema noted Skin: negatives: no rash; no porphyria cutanea tarda Lymphatic: no cervical lymphadenopathy   Laboratory Genotype: No results found for: HCVGENOTYPE HCV viral  load: No results found for: HCVQUANT Lab Results  Component Value Date   WBC 8.6 12/16/2017   HGB 12.4 12/16/2017   HCT 39.4 12/16/2017   MCV 76.5 (L) 12/16/2017   PLT 149.0 (L) 12/16/2017    Lab Results  Component Value Date   CREATININE 0.63 12/16/2017   BUN 13 12/16/2017   NA 137 12/16/2017   K 3.9 12/16/2017   CL 103 12/16/2017   CO2 27 12/16/2017    Lab Results  Component Value Date   ALT 31 12/16/2017   AST 23 12/16/2017   ALKPHOS 84 12/16/2017     Labs and history reviewed and show CHILD-PUGH B  5-6 points: Child class A 7-9 points: Child class B 10-15 points: Child class C  Lab Results  Component Value Date   INR 1.2 (H) 12/16/2017   BILITOT 0.7 12/16/2017   ALBUMIN 2.9 (L) 12/16/2017     Assessment: New Patient with Chronic Hepatitis C genotype 1a, untreated.  I discussed with the patient the lab findings that confirm chronic hepatitis C as well as the natural history and progression of disease including about 30% of people who develop cirrhosis of the liver if left untreated and once cirrhosis is established there is a  2-7% risk per year of liver cancer and liver failure.  I discussed the importance of treatment and benefits in reducing the risk, even if significant liver fibrosis exists.   Plan: 1) Patient counseled extensively on limiting acetaminophen to no more than 2 grams daily, avoidance of alcohol. 2) Transmission discussed with patient including sexual transmission, sharing razors and toothbrush.   3) Will need referral to gastroenterology if concern for cirrhosis 4) Will need referral for substance abuse counseling: No.; Further work up to include urine drug screen  No. 5) Will prescribe Harvoni for 12 weeks + 600 mg ribavirin 6) Hepatitis A and B vaccine given by other provider 8) Pneumovax vaccine given 9) follow up after medication approval with CBC due to ribavirin

## 2018-01-13 MED FILL — HARVONI 90-400 MG TABLET: 90-400 | 28 days supply | Qty: 28 | Fill #0

## 2018-01-13 MED FILL — RIBAVIRIN 200 MG TABLET: 200 | 28 days supply | Qty: 84 | Fill #0

## 2018-01-24 ENCOUNTER — Encounter: Payer: Self-pay | Admitting: Pharmacy Technician

## 2018-02-08 ENCOUNTER — Ambulatory Visit (INDEPENDENT_AMBULATORY_CARE_PROVIDER_SITE_OTHER): Payer: Medicare Other | Admitting: Pharmacist Clinician (PhC)/ Clinical Pharmacy Specialist

## 2018-02-08 DIAGNOSIS — B182 Chronic viral hepatitis C: Secondary | ICD-10-CM

## 2018-02-08 NOTE — Patient Instructions (Addendum)
Please give Harvoni daily at dinner to finish 12wks of therapy Please give Ribavirin tablets with dinner to finish 12 wks of therapy Please give patient the hepatitis B vaccine series at month 0, 1, 6 Please give patient the hepatitis A vaccine series at month 0 and 6 Please use Zofran 1m PO q8hrs as needed for nausea

## 2018-02-08 NOTE — Progress Notes (Signed)
HPI: [name] is a 67 y.o. female who is here to see pharmacy for her hep C follow up  No results found for: HCVGENOTYPE, HEPCGENOTYPE  Allergies: No Known Allergies  Vitals:    Past Medical History: Past Medical History:  Diagnosis Date  . Acute systolic CHF (congestive heart failure) ([place]) 06/28/2017   EF was normal 2016, now 20% with grade 2 diastolic dysfunction, no significant CAD at cath  . Anemia 10/12/2008  . CKD (chronic kidney disease)   . Depression   . Diaphragmatic hernia without mention of obstruction or gangrene 08/29/2010  . Edema 05/20/2009  . Gastroparesis 10/05/2010  . GERD (gastroesophageal reflux disease) 02/02/2009  . Hx of colonic polyps   . Hypertension 10/12/2008  . Hypotension, unspecified 05/20/2009  . Immobility syndrome (paraplegic) 1977  . Iron deficiency anemia, unspecified 10/12/2008  . Leiomyoma of uterus   . Malignant neoplasm of breast (female), unspecified site 10/05/2010   2003, 2013   . Paraplegia ([place])   . Pleural effusion   . Pneumonia   . Respiratory failure ([place])   . Rheumatoid arthritis ([place]) 01/17/2009    Social History: Social History   Socioeconomic History  . Marital status: Single    Spouse name: Not on file  . Number of children: Not on file  . Years of education: Not on file  . Highest education level: Not on file  Occupational History  . Not on file  Social Needs  . Financial resource strain: Not on file  . Food insecurity:    Worry: Not on file    Inability: Not on file  . Transportation needs:    Medical: Not on file    Non-medical: Not on file  Tobacco Use  . Smoking status: Former Smoker    Packs/day: 1.00    Years: 10.00    Pack years: 10.00    Last attempt to quit: 07/28/1995    Years since quitting: 22.5  . Smokeless tobacco: Never Used  Substance and Sexual Activity  . Alcohol use: No  . Drug use: No  . Sexual activity: Not on file  Lifestyle  . Physical activity:    Days per week:  Not on file    Minutes per session: Not on file  . Stress: Not on file  Relationships  . Social connections:    Talks on phone: Not on file    Gets together: Not on file    Attends religious service: Not on file    Active member of club or organization: Not on file    Attends meetings of clubs or organizations: Not on file    Relationship status: Not on file  Other Topics Concern  . 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     Has no children.     Education: Law degree.    Labs: Hep B S Ab (no units)  Date Value  12/16/2017 NON-REACTIVE   Hepatitis B Surface Ag (no units)  Date Value  12/16/2017 NON-REACTIVE    No results found for: HCVGENOTYPE, HEPCGENOTYPE  Hepatitis C RNA quantitative Latest Ref Rng & Units 12/16/2017  HCV Quantitative Log NOT DETECT Log IU/mL 5.72(H)    AST (U/L)  Date Value  12/16/2017 23  06/28/2017 31  12/10/2016 25   ALT (U/L)  Date Value  12/16/2017 31  06/28/2017 42  12/10/2016 32   INR  Date Value  12/16/2017 1.2 ratio (  H)  07/01/2017 1.10  06/11/2016 1.19    CrCl: CrCl cannot be calculated (Patient's most recent lab result is older than the maximum 21 days allowed.).  Fibrosis Score: F4 as assessed by [place]  Child-Pugh Score: Class B  Previous Treatment Regimen: None  Assessment: [name] started on her Harvoni/riba on 6/20. She is currently staying at [facility] (used to be [facility]). She has quite a few comobidities. She brought the [facility] to the visit today and both meds are on there. She is on a ton of meds. She hopes that they have been giving it to her daily. She has been off of omeprazole now but has taken it on 1-2 occasions due to severe heartburn. She is a tough case because of the her GERD and decompensation. Advised her/AVS to take the Harvoni/riba at dinner so that if she needs her prilosec that day, she can move the Harvoni to 4PM that day  instead. She also complained of some nausea. We will send direction there to use zofran for PRN. They use their own pharmacy there so all directions were put on the AVS. Made her a calendar today so that she can give the the tech or nurse giving meds that day.   Gave instruction to [facility] to start the hep A and B series there. Once she is done we will schedule the [facility] and cure visit with Dr. Linus Salmons.  Recommendations:  Hep C VL, CMP Continue Harvoni 1 daily x 12 wks with dinner Continue Ribavirin 600mg  PO qday dinner x 12 wks Zofran 4 mg PO q8h F/u with pharmacy in about 1 mp  Clever Geraldo, Pharm.DJake Samples, [facility] 02/08/2018, 4:04 PM

## 2018-02-09 ENCOUNTER — Telehealth: Payer: Self-pay | Admitting: Pharmacist Clinician (PhC)/ Clinical Pharmacy Specialist

## 2018-02-09 ENCOUNTER — Telehealth: Payer: Self-pay | Admitting: Pulmonary Disease

## 2018-02-09 LAB — CBC
HEMATOCRIT: 37.4 % (ref 35.0–45.0)
HEMOGLOBIN: 12.1 g/dL (ref 11.7–15.5)
MCH: 23.4 pg — ABNORMAL LOW (ref 27.0–33.0)
MCHC: 32.4 g/dL (ref 32.0–36.0)
MCV: 72.2 fL — AB (ref 80.0–100.0)
MPV: 9.3 fL (ref 7.5–12.5)
Platelets: 383 10*3/uL (ref 140–400)
RBC: 5.18 10*6/uL — ABNORMAL HIGH (ref 3.80–5.10)
RDW: 18.7 % — ABNORMAL HIGH (ref 11.0–15.0)
WBC: 12.2 10*3/uL — AB (ref 3.8–10.8)

## 2018-02-09 LAB — COMPLETE METABOLIC PANEL WITH GFR
AG Ratio: 0.6 (calc) — ABNORMAL LOW (ref 1.0–2.5)
ALT: 16 U/L (ref 6–29)
AST: 13 U/L (ref 10–35)
Albumin: 3.5 g/dL — ABNORMAL LOW (ref 3.6–5.1)
Alkaline phosphatase (APISO): 85 U/L (ref 33–130)
BILIRUBIN TOTAL: 0.5 mg/dL (ref 0.2–1.2)
BUN/Creatinine Ratio: 45 (calc) — ABNORMAL HIGH (ref 6–22)
BUN: 48 mg/dL — AB (ref 7–25)
CALCIUM: 9.5 mg/dL (ref 8.6–10.4)
CHLORIDE: 96 mmol/L — AB (ref 98–110)
CO2: 20 mmol/L (ref 20–32)
Creat: 1.06 mg/dL — ABNORMAL HIGH (ref 0.50–0.99)
GFR, EST NON AFRICAN AMERICAN: 54 mL/min/{1.73_m2} — AB (ref >=60)
GFR, Est African American: 63 mL/min/{1.73_m2} (ref >=60)
GLUCOSE: 109 mg/dL — AB (ref 65–99)
Globulin: 5.8 g/dL (calc) — ABNORMAL HIGH (ref 1.9–3.7)
POTASSIUM: 4.4 mmol/L (ref 3.5–5.3)
Sodium: 127 mmol/L — ABNORMAL LOW (ref 135–146)
TOTAL PROTEIN: 9.3 g/dL — AB (ref 6.1–8.1)

## 2018-02-09 NOTE — Telephone Encounter (Signed)
Spoke with Mardene Celeste at [facility]. Advised her that we do not remove chest tubes in our office. Nothing further was needed.

## 2018-02-09 NOTE — Telephone Encounter (Signed)
Talked to Notnamed's nurse at [facility] today to ask her to repeat a bmet next week to eval scr again per Dr. Linus Salmons. She will fax the results back to [phone]

## 2018-02-10 ENCOUNTER — Emergency Department (HOSPITAL_COMMUNITY): Payer: Medicare Other

## 2018-02-10 ENCOUNTER — Other Ambulatory Visit: Payer: Self-pay

## 2018-02-10 ENCOUNTER — Inpatient Hospital Stay (HOSPITAL_COMMUNITY)
Admission: EM | Admit: 2018-02-10 | Discharge: 2018-02-14 | DRG: 872 | Disposition: A | Discharge status: Skilled Nursing Facility | Payer: Medicare Other | Attending: Family Medicine | Admitting: Family Medicine

## 2018-02-10 ENCOUNTER — Encounter (HOSPITAL_COMMUNITY): Payer: Self-pay | Admitting: Radiology

## 2018-02-10 ENCOUNTER — Telehealth: Payer: Self-pay | Admitting: Pharmacist Clinician (PhC)/ Clinical Pharmacy Specialist

## 2018-02-10 ENCOUNTER — Other Ambulatory Visit: Payer: Self-pay | Admitting: Adult Health Nurse Practitioner

## 2018-02-10 DIAGNOSIS — R112 Nausea with vomiting, unspecified: Secondary | ICD-10-CM

## 2018-02-10 DIAGNOSIS — B182 Chronic viral hepatitis C: Secondary | ICD-10-CM | POA: Diagnosis present

## 2018-02-10 DIAGNOSIS — G8220 Paraplegia, unspecified: Secondary | ICD-10-CM | POA: Diagnosis present

## 2018-02-10 DIAGNOSIS — I130 Hypertensive heart and chronic kidney disease with heart failure and stage 1 through stage 4 chronic kidney disease, or unspecified chronic kidney disease: Secondary | ICD-10-CM | POA: Diagnosis present

## 2018-02-10 DIAGNOSIS — Z79899 Other long term (current) drug therapy: Secondary | ICD-10-CM

## 2018-02-10 DIAGNOSIS — I10 Essential (primary) hypertension: Secondary | ICD-10-CM | POA: Diagnosis present

## 2018-02-10 DIAGNOSIS — N189 Chronic kidney disease, unspecified: Secondary | ICD-10-CM | POA: Diagnosis present

## 2018-02-10 DIAGNOSIS — A419 Sepsis, unspecified organism: Secondary | ICD-10-CM | POA: Diagnosis not present

## 2018-02-10 DIAGNOSIS — Z87891 Personal history of nicotine dependence: Secondary | ICD-10-CM

## 2018-02-10 DIAGNOSIS — R0602 Shortness of breath: Secondary | ICD-10-CM

## 2018-02-10 DIAGNOSIS — Z853 Personal history of malignant neoplasm of breast: Secondary | ICD-10-CM

## 2018-02-10 DIAGNOSIS — M359 Systemic involvement of connective tissue, unspecified: Secondary | ICD-10-CM | POA: Diagnosis present

## 2018-02-10 DIAGNOSIS — R0600 Dyspnea, unspecified: Secondary | ICD-10-CM

## 2018-02-10 DIAGNOSIS — Z8529 Personal history of malignant neoplasm of other respiratory and intrathoracic organs: Secondary | ICD-10-CM

## 2018-02-10 DIAGNOSIS — L97529 Non-pressure chronic ulcer of other part of left foot with unspecified severity: Secondary | ICD-10-CM | POA: Diagnosis present

## 2018-02-10 DIAGNOSIS — N179 Acute kidney failure, unspecified: Secondary | ICD-10-CM | POA: Diagnosis present

## 2018-02-10 DIAGNOSIS — I5042 Chronic combined systolic (congestive) and diastolic (congestive) heart failure: Secondary | ICD-10-CM | POA: Diagnosis present

## 2018-02-10 DIAGNOSIS — I428 Other cardiomyopathies: Secondary | ICD-10-CM

## 2018-02-10 DIAGNOSIS — K5900 Constipation, unspecified: Secondary | ICD-10-CM | POA: Diagnosis present

## 2018-02-10 DIAGNOSIS — F329 Major depressive disorder, single episode, unspecified: Secondary | ICD-10-CM | POA: Diagnosis present

## 2018-02-10 DIAGNOSIS — N319 Neuromuscular dysfunction of bladder, unspecified: Secondary | ICD-10-CM | POA: Diagnosis present

## 2018-02-10 DIAGNOSIS — K210 Gastro-esophageal reflux disease with esophagitis: Secondary | ICD-10-CM | POA: Diagnosis present

## 2018-02-10 DIAGNOSIS — N131 Hydronephrosis with ureteral stricture, not elsewhere classified: Secondary | ICD-10-CM | POA: Diagnosis present

## 2018-02-10 DIAGNOSIS — K920 Hematemesis: Secondary | ICD-10-CM | POA: Diagnosis present

## 2018-02-10 DIAGNOSIS — R1110 Vomiting, unspecified: Secondary | ICD-10-CM | POA: Diagnosis present

## 2018-02-10 DIAGNOSIS — I509 Heart failure, unspecified: Secondary | ICD-10-CM

## 2018-02-10 DIAGNOSIS — Z9013 Acquired absence of bilateral breasts and nipples: Secondary | ICD-10-CM

## 2018-02-10 DIAGNOSIS — K5641 Fecal impaction: Secondary | ICD-10-CM | POA: Diagnosis present

## 2018-02-10 DIAGNOSIS — N390 Urinary tract infection, site not specified: Secondary | ICD-10-CM | POA: Diagnosis present

## 2018-02-10 DIAGNOSIS — I959 Hypotension, unspecified: Secondary | ICD-10-CM | POA: Diagnosis present

## 2018-02-10 DIAGNOSIS — M069 Rheumatoid arthritis, unspecified: Secondary | ICD-10-CM | POA: Diagnosis present

## 2018-02-10 DIAGNOSIS — K592 Neurogenic bowel, not elsewhere classified: Secondary | ICD-10-CM | POA: Diagnosis present

## 2018-02-10 DIAGNOSIS — I272 Pulmonary hypertension, unspecified: Secondary | ICD-10-CM | POA: Diagnosis present

## 2018-02-10 LAB — CBC
HEMATOCRIT: 41.2 % (ref 36.0–46.0)
Hemoglobin: 12.9 g/dL (ref 12.0–15.0)
MCH: 23.4 pg — ABNORMAL LOW (ref 26.0–34.0)
MCHC: 31.3 g/dL (ref 30.0–36.0)
MCV: 74.6 fL — AB (ref 78.0–100.0)
Platelets: 331 10*3/uL (ref 150–400)
RBC: 5.52 MIL/uL — ABNORMAL HIGH (ref 3.87–5.11)
RDW: 19.5 % — AB (ref 11.5–15.5)
WBC: 22.7 10*3/uL — AB (ref 4.0–10.5)

## 2018-02-10 LAB — COMPREHENSIVE METABOLIC PANEL
ALBUMIN: 2.8 g/dL — AB (ref 3.5–5.0)
ALT: 21 U/L (ref 0–44)
AST: 18 U/L (ref 15–41)
Alkaline Phosphatase: 96 U/L (ref 38–126)
Anion gap: 14 (ref 5–15)
BUN: 50 mg/dL — AB (ref 8–23)
CHLORIDE: 93 mmol/L — AB (ref 98–111)
CO2: 24 mmol/L (ref 22–32)
Calcium: 10.2 mg/dL (ref 8.9–10.3)
Creatinine, Ser: 1.43 mg/dL — ABNORMAL HIGH (ref 0.44–1.00)
GFR calc Af Amer: 43 mL/min — ABNORMAL LOW (ref >60)
GFR calc non Af Amer: 37 mL/min — ABNORMAL LOW (ref >60)
GLUCOSE: 109 mg/dL — AB (ref 70–99)
POTASSIUM: 4.1 mmol/L (ref 3.5–5.1)
SODIUM: 131 mmol/L — AB (ref 135–145)
Total Bilirubin: 0.7 mg/dL (ref 0.3–1.2)
Total Protein: 10.3 g/dL — ABNORMAL HIGH (ref 6.5–8.1)

## 2018-02-10 LAB — URINALYSIS, ROUTINE W REFLEX MICROSCOPIC
BILIRUBIN URINE: NEGATIVE
Glucose, UA: NEGATIVE mg/dL
HGB URINE DIPSTICK: NEGATIVE
KETONES UR: NEGATIVE mg/dL
Nitrite: NEGATIVE
PROTEIN: NEGATIVE mg/dL
SPECIFIC GRAVITY, URINE: 1.019 (ref 1.005–1.030)
WBC, UA: >50 WBC/hpf — ABNORMAL HIGH (ref 0–5)
pH: 5 (ref 5.0–8.0)

## 2018-02-10 LAB — HEPATITIS C RNA QUANTITATIVE
HCV Quantitative Log: <1.18 Log IU/mL
HCV RNA, PCR, QN: NOT DETECTED [IU]/mL

## 2018-02-10 LAB — TYPE AND SCREEN
ABO/RH(D): B POS
ANTIBODY SCREEN: NEGATIVE

## 2018-02-10 LAB — I-STAT CG4 LACTIC ACID, ED: Lactic Acid, Venous: 1.96 mmol/L — ABNORMAL HIGH (ref 0.5–1.9)

## 2018-02-10 LAB — ABO/RH: ABO/RH(D): B POS

## 2018-02-10 LAB — PROTIME-INR
INR: 1.37
Prothrombin Time: 16.7 seconds — ABNORMAL HIGH (ref 11.4–15.2)

## 2018-02-10 MED ORDER — ONDANSETRON HCL 4 MG/2ML IJ SOLN
4.0000 mg | Freq: Once | INTRAMUSCULAR | Status: AC
  Administered 2018-02-10: 4 mg via INTRAVENOUS
  Filled 2018-02-10: qty 2

## 2018-02-10 MED ORDER — IOHEXOL 300 MG/ML  SOLN
80.0000 mL | Freq: Once | INTRAMUSCULAR | Status: AC | PRN
  Administered 2018-02-10: 80 mL via INTRAVENOUS

## 2018-02-10 MED ORDER — VANCOMYCIN HCL 10 G IV SOLR
1250.0000 mg | Freq: Once | INTRAVENOUS | Status: AC
  Administered 2018-02-10: 1250 mg via INTRAVENOUS
  Filled 2018-02-10: qty 1250

## 2018-02-10 MED ORDER — SODIUM CHLORIDE 0.9 % IV BOLUS
1000.0000 mL | Freq: Once | INTRAVENOUS | Status: AC
  Administered 2018-02-10: 1000 mL via INTRAVENOUS

## 2018-02-10 MED ORDER — PIPERACILLIN-TAZOBACTAM 3.375 G IVPB 30 MIN
3.3750 g | Freq: Once | INTRAVENOUS | Status: AC
  Administered 2018-02-10: 3.375 g via INTRAVENOUS
  Filled 2018-02-10: qty 50

## 2018-02-10 MED ORDER — VANCOMYCIN HCL IN DEXTROSE 1-5 GM/200ML-% IV SOLN: 1000.0000 mg | Freq: Once | INTRAVENOUS | Status: DC

## 2018-02-10 NOTE — ED Notes (Signed)
ED Provider at bedside. 

## 2018-02-10 NOTE — Telephone Encounter (Signed)
Bryson Ha (pharmacist) called today to let us know that [name] is being seen in the ED for likely GIB. IV PPI will likely be started. Olin Hauser to continue the course of Harvoni and riba for her childpugh B hep C and hope for the best.

## 2018-02-10 NOTE — ED Triage Notes (Signed)
Arrived via EMS from [facility]. Onset today emesis multiple episodes was test and positive for blood. History of anemia.  EMS reported BP systolic BP 98'O. Patient alert answering and following commands appropriate.
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
## 2018-02-10 NOTE — Telephone Encounter (Signed)
Will hope for the best.

## 2018-02-11 ENCOUNTER — Encounter (HOSPITAL_COMMUNITY): Payer: Self-pay

## 2018-02-11 DIAGNOSIS — K210 Gastro-esophageal reflux disease with esophagitis: Secondary | ICD-10-CM | POA: Diagnosis present

## 2018-02-11 DIAGNOSIS — N189 Chronic kidney disease, unspecified: Secondary | ICD-10-CM | POA: Diagnosis present

## 2018-02-11 DIAGNOSIS — I428 Other cardiomyopathies: Secondary | ICD-10-CM | POA: Diagnosis present

## 2018-02-11 DIAGNOSIS — N319 Neuromuscular dysfunction of bladder, unspecified: Secondary | ICD-10-CM | POA: Diagnosis present

## 2018-02-11 DIAGNOSIS — K5900 Constipation, unspecified: Secondary | ICD-10-CM | POA: Diagnosis not present

## 2018-02-11 DIAGNOSIS — F329 Major depressive disorder, single episode, unspecified: Secondary | ICD-10-CM | POA: Diagnosis present

## 2018-02-11 DIAGNOSIS — A419 Sepsis, unspecified organism: Secondary | ICD-10-CM | POA: Diagnosis present

## 2018-02-11 DIAGNOSIS — N179 Acute kidney failure, unspecified: Secondary | ICD-10-CM | POA: Diagnosis present

## 2018-02-11 DIAGNOSIS — I272 Pulmonary hypertension, unspecified: Secondary | ICD-10-CM | POA: Diagnosis present

## 2018-02-11 DIAGNOSIS — K5641 Fecal impaction: Secondary | ICD-10-CM | POA: Diagnosis present

## 2018-02-11 DIAGNOSIS — Z853 Personal history of malignant neoplasm of breast: Secondary | ICD-10-CM | POA: Diagnosis not present

## 2018-02-11 DIAGNOSIS — R1110 Vomiting, unspecified: Secondary | ICD-10-CM | POA: Diagnosis present

## 2018-02-11 DIAGNOSIS — R112 Nausea with vomiting, unspecified: Secondary | ICD-10-CM

## 2018-02-11 DIAGNOSIS — M069 Rheumatoid arthritis, unspecified: Secondary | ICD-10-CM | POA: Diagnosis present

## 2018-02-11 DIAGNOSIS — N390 Urinary tract infection, site not specified: Secondary | ICD-10-CM | POA: Diagnosis present

## 2018-02-11 DIAGNOSIS — I5033 Acute on chronic diastolic (congestive) heart failure: Secondary | ICD-10-CM

## 2018-02-11 DIAGNOSIS — Z79899 Other long term (current) drug therapy: Secondary | ICD-10-CM | POA: Diagnosis not present

## 2018-02-11 DIAGNOSIS — I5042 Chronic combined systolic (congestive) and diastolic (congestive) heart failure: Secondary | ICD-10-CM | POA: Diagnosis present

## 2018-02-11 DIAGNOSIS — Z9013 Acquired absence of bilateral breasts and nipples: Secondary | ICD-10-CM | POA: Diagnosis not present

## 2018-02-11 DIAGNOSIS — G8220 Paraplegia, unspecified: Secondary | ICD-10-CM | POA: Diagnosis present

## 2018-02-11 DIAGNOSIS — B182 Chronic viral hepatitis C: Secondary | ICD-10-CM | POA: Diagnosis present

## 2018-02-11 DIAGNOSIS — K920 Hematemesis: Secondary | ICD-10-CM | POA: Diagnosis present

## 2018-02-11 DIAGNOSIS — I130 Hypertensive heart and chronic kidney disease with heart failure and stage 1 through stage 4 chronic kidney disease, or unspecified chronic kidney disease: Secondary | ICD-10-CM | POA: Diagnosis present

## 2018-02-11 DIAGNOSIS — I959 Hypotension, unspecified: Secondary | ICD-10-CM | POA: Diagnosis present

## 2018-02-11 DIAGNOSIS — Z87891 Personal history of nicotine dependence: Secondary | ICD-10-CM | POA: Diagnosis not present

## 2018-02-11 DIAGNOSIS — N131 Hydronephrosis with ureteral stricture, not elsewhere classified: Secondary | ICD-10-CM | POA: Diagnosis present

## 2018-02-11 DIAGNOSIS — K592 Neurogenic bowel, not elsewhere classified: Secondary | ICD-10-CM | POA: Diagnosis present

## 2018-02-11 DIAGNOSIS — L97529 Non-pressure chronic ulcer of other part of left foot with unspecified severity: Secondary | ICD-10-CM | POA: Diagnosis present

## 2018-02-11 LAB — BASIC METABOLIC PANEL
ANION GAP: 11 (ref 5–15)
BUN: 44 mg/dL — AB (ref 8–23)
CALCIUM: 8.6 mg/dL — AB (ref 8.9–10.3)
CHLORIDE: 107 mmol/L (ref 98–111)
CO2: 20 mmol/L — AB (ref 22–32)
CREATININE: 1.23 mg/dL — AB (ref 0.44–1.00)
GFR calc Af Amer: 51 mL/min — ABNORMAL LOW (ref >60)
GFR calc non Af Amer: 44 mL/min — ABNORMAL LOW (ref >60)
Glucose, Bld: 88 mg/dL (ref 70–99)
Potassium: 3.3 mmol/L — ABNORMAL LOW (ref 3.5–5.1)
Sodium: 138 mmol/L (ref 135–145)

## 2018-02-11 LAB — CBC
HCT: 31.1 % — ABNORMAL LOW (ref 36.0–46.0)
HEMATOCRIT: 35.6 % — AB (ref 36.0–46.0)
HEMOGLOBIN: 9.6 g/dL — AB (ref 12.0–15.0)
Hemoglobin: 10.8 g/dL — ABNORMAL LOW (ref 12.0–15.0)
MCH: 23.1 pg — AB (ref 26.0–34.0)
MCH: 23.1 pg — AB (ref 26.0–34.0)
MCHC: 30.3 g/dL (ref 30.0–36.0)
MCHC: 30.9 g/dL (ref 30.0–36.0)
MCV: 76.1 fL — AB (ref 78.0–100.0)
MCV: 74.9 fL — ABNORMAL LOW (ref 78.0–100.0)
Platelets: 276 10*3/uL (ref 150–400)
Platelets: 272 10*3/uL (ref 150–400)
RBC: 4.68 MIL/uL (ref 3.87–5.11)
RBC: 4.15 MIL/uL (ref 3.87–5.11)
RDW: 19.7 % — AB (ref 11.5–15.5)
RDW: 19.5 % — AB (ref 11.5–15.5)
WBC: 19 10*3/uL — AB (ref 4.0–10.5)
WBC: 15.2 10*3/uL — ABNORMAL HIGH (ref 4.0–10.5)

## 2018-02-11 LAB — TROPONIN I
Troponin I: 0.07 ng/mL (ref <0.03)
Troponin I: 0.03 ng/mL (ref <0.03)
Troponin I: 0.03 ng/mL (ref <0.03)

## 2018-02-11 LAB — MRSA PCR SCREENING: MRSA by PCR: POSITIVE — AB

## 2018-02-11 MED ORDER — PIPERACILLIN-TAZOBACTAM 3.375 G IVPB
3.3750 g | Freq: Three times a day (TID) | INTRAVENOUS | Status: DC
  Administered 2018-02-11 – 2018-02-14 (×10): 3.375 g via INTRAVENOUS
  Filled 2018-02-11 (×11): qty 50

## 2018-02-11 MED ORDER — CEFTRIAXONE SODIUM 1 G IJ SOLR
1.0000 g | INTRAMUSCULAR | Status: DC
  Filled 2018-02-11: qty 10

## 2018-02-11 MED ORDER — ONDANSETRON HCL 4 MG/2ML IJ SOLN
4.0000 mg | Freq: Four times a day (QID) | INTRAMUSCULAR | Status: DC | PRN
  Administered 2018-02-11: 4 mg via INTRAVENOUS
  Filled 2018-02-11: qty 2

## 2018-02-11 MED ORDER — BISACODYL 10 MG RE SUPP
10.0000 mg | Freq: Once | RECTAL | Status: AC
  Administered 2018-02-11: 10 mg via RECTAL
  Filled 2018-02-11: qty 1

## 2018-02-11 MED ORDER — ACETAMINOPHEN 325 MG PO TABS
650.0000 mg | ORAL_TABLET | Freq: Four times a day (QID) | ORAL | Status: DC | PRN
  Administered 2018-02-14: 650 mg via ORAL
  Filled 2018-02-11: qty 2

## 2018-02-11 MED ORDER — MUPIROCIN 2 % EX OINT
1.0000 "application " | TOPICAL_OINTMENT | Freq: Two times a day (BID) | CUTANEOUS | Status: DC
  Administered 2018-02-11 – 2018-02-14 (×7): 1 via NASAL
  Filled 2018-02-11 (×2): qty 22

## 2018-02-11 MED ORDER — SODIUM CHLORIDE 0.9 % IV SOLN
80.0000 mg | Freq: Once | INTRAVENOUS | Status: AC
  Administered 2018-02-11: 80 mg via INTRAVENOUS
  Filled 2018-02-11: qty 80

## 2018-02-11 MED ORDER — LACTULOSE 10 GM/15ML PO SOLN
30.0000 g | ORAL | Status: AC
  Administered 2018-02-11 (×3): 30 g via ORAL
  Filled 2018-02-11 (×3): qty 45

## 2018-02-11 MED ORDER — SODIUM CHLORIDE 0.9 % IV SOLN
INTRAVENOUS | Status: AC
  Administered 2018-02-11: 04:00:00 via INTRAVENOUS

## 2018-02-11 MED ORDER — ONDANSETRON HCL 4 MG PO TABS: 4.0000 mg | ORAL_TABLET | Freq: Four times a day (QID) | ORAL | Status: DC | PRN

## 2018-02-11 MED ORDER — ACETAMINOPHEN 650 MG RE SUPP: 650.0000 mg | Freq: Four times a day (QID) | RECTAL | Status: DC | PRN

## 2018-02-11 MED ORDER — POTASSIUM CHLORIDE CRYS ER 20 MEQ PO TBCR
40.0000 meq | EXTENDED_RELEASE_TABLET | Freq: Once | ORAL | Status: AC
  Administered 2018-02-11: 40 meq via ORAL
  Filled 2018-02-11: qty 2

## 2018-02-11 MED ORDER — FLEET ENEMA 7-19 GM/118ML RE ENEM: 1.0000 | ENEMA | Freq: Once | RECTAL | Status: DC | PRN

## 2018-02-11 MED ORDER — CHLORHEXIDINE GLUCONATE CLOTH 2 % EX PADS
6.0000 | MEDICATED_PAD | Freq: Every day | CUTANEOUS | Status: DC
  Administered 2018-02-11 – 2018-02-13 (×3): 6 via TOPICAL

## 2018-02-11 MED ORDER — SODIUM CHLORIDE 0.9 % IV SOLN
8.0000 mg/h | INTRAVENOUS | Status: AC
  Administered 2018-02-11 – 2018-02-12 (×3): 8 mg/h via INTRAVENOUS
  Filled 2018-02-11 (×5): qty 80

## 2018-02-11 MED ORDER — POLYETHYLENE GLYCOL 3350 17 G PO PACK
17.0000 g | PACK | Freq: Two times a day (BID) | ORAL | Status: DC
  Administered 2018-02-11 – 2018-02-14 (×6): 17 g via ORAL
  Filled 2018-02-11 (×6): qty 1
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
## 2018-02-11 NOTE — Progress Notes (Signed)
MRSA positive standing orders initiated.

## 2018-02-11 NOTE — Clinical Social Work Note (Signed)
Clinical Social Work Assessment  Patient Details  Name: [name] MRN: [id number] Date of Birth: 12-Aug-1950  Date of referral:  02/11/18               Reason for consult:  Discharge Planning                Permission sought to share information with:  Facility Sport and exercise psychologist, Family Supports Permission granted to share information::  Yes, Verbal Permission Granted  Name::     [name]  Agency::  [facility]  Relationship::  Sister  Contact Information:  [phone]  Housing/Transportation Living arrangements for the past 2 months:  [place] of Information:  Patient, Medical Team, Facility, Siblings Patient Interpreter Needed:  None Criminal Activity/Legal Involvement Pertinent to Current Situation/Hospitalization:  No - Comment as needed Significant Relationships:  Siblings Lives with:  Facility Resident Do you feel safe going back to the place where you live?  Yes Need for family participation in patient care:  Yes (Comment)  Care giving concerns:  Patient is a long-term resident at [facility].   Social Worker assessment / plan:  CSW met with patient. Sister at bedside. CSW introduced role and explained that discharge planning would be discussed. Patient and her sister confirmed she is from [facility] and plans to return at discharge. No further concerns. CSW encouraged patient and her sister to contact CSW as needed. CSW will continue to follow patient and her sister for support and facilitate discharge back to SNF one medically stable.  Employment status:  Retired Nurse, adult PT Recommendations:  Not assessed at this time Information / Referral to community resources:  [place]  Patient/Family's Response to care:  Patient and her sister agreeable to return to SNF. Patient's sister supportive and involved in patient's care. Patient and her sister appreciated social work  intervention.  Patient/Family's Understanding of and Emotional Response to Diagnosis, Current Treatment, and Prognosis:  Patient and her sister have a good understanding of the reason for admission and plan to return to SNF once medically stable for discharge. Patient and her sister appear happy with hospital care.  Emotional Assessment Appearance:  Appears stated age Attitude/Demeanor/Rapport:  Engaged, Gracious Affect (typically observed):  Accepting, Appropriate, Calm, Pleasant Orientation:  Oriented to Self, Oriented to Place, Oriented to  Time, Oriented to Situation Alcohol / Substance use:  Never Used Psych involvement (Current and /or in the community):  No (Comment)  Discharge Needs  Concerns to be addressed:  Care Coordination Readmission within the last 30 days:  No Current discharge risk:  None Barriers to Discharge:  Continued Medical Work up   Candie Chroman, LCSW 02/11/2018, 3:27 PM

## 2018-02-11 NOTE — Progress Notes (Signed)
Troponin 0.07, no s/s, MD aware, will continue to monitor, Thanks Arvella Nigh RN.

## 2018-02-11 NOTE — NC FL2 (Signed)
[place] LEVEL OF CARE SCREENING TOOL     IDENTIFICATION  Patient Name: [name] Birthdate: Jul 28, 1950 Sex: female Admission Date (Current Location): 02/10/2018  [facility] and [place] Number:  Herbalist and Address:  [facility], [place] [address] [place], [place] [address]      Provider Number: [id number]  Attending Physician Name and Address:  Roxan Hockey, MD  Relative Name and Phone Number:       Current Level of Care: Hospital Recommended Level of Care: [place] Prior Approval Number:    Date Approved/Denied:   PASRR Number: [id number]  Discharge Plan: SNF    Current Diagnoses: Patient Active Problem List   Diagnosis Date Noted  . Intractable vomiting 02/11/2018  . Chronic hepatitis C without hepatic coma ([place]) 01/10/2018  . NICM (nonischemic cardiomyopathy) ([place]) 08/25/2017  . Normal coronary arteries 08/25/2017  . Diastolic dysfunction 96/29/5284  . S/P thoracentesis   . Status post thoracentesis   . S/p percutaneous right heart catheterization   . Acute systolic (congestive) heart failure ([place])   . Pleural effusion on right 06/29/2017  . Respiratory failure with hypoxia ([place]) 06/29/2017  . Pressure injury of skin 06/29/2017  . Acute on chronic respiratory failure with hypoxemia ([place]) 06/29/2017  . SOB (shortness of breath)   . Hypokalemia 09/21/2016  . HCAP (healthcare-associated pneumonia) 07/28/2016  . Fever in adult 07/28/2016  . Sepsis ([place]) 06/10/2016  . Decubitus ulcer of coccyx 02/04/2016  . Hyperglycemia 12/03/2015  . H/O bilateral mastectomy 07/25/2015  . Benign hypertensive heart disease without heart failure 07/15/2015  . Hiatal hernia 07/06/2015  . 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 Pleural effusion 07/06/2015  . History of breast cancer 08/21/2014  . Adynamic ileus ([place]) 07/22/2014  . Elevated liver enzymes 07/22/2014  . CHF (congestive heart  failure) ([place]) 07/05/2014  . Gastroparesis 07/05/2014  . Microcytic anemia 01/05/2014  . Insomnia 07/14/2013  . Edema 05/01/2013  . Rheumatoid arthritis ([place]) 02/23/2013  . GERD (gastroesophageal reflux disease) 02/23/2013  . Essential hypertension 02/23/2013  . Neurogenic bladder 02/23/2013  . Paraplegia ([place]) 02/23/2013  . Depression 02/23/2013  . Allergic rhinitis 02/23/2013  . Constipation 02/23/2013  . 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[place]) 09/24/2011  . Breast cancer, left breast ([place]) 09/08/2011    Orientation RESPIRATION BLADDER Height & Weight     Time, Self, Situation, Place  Normal Incontinent Weight: 147 lb 11.3 oz (67 kg) Height:  5' (152.4 cm)  BEHAVIORAL SYMPTOMS/MOOD NEUROLOGICAL BOWEL NUTRITION STATUS  (None) (None) Incontinent Diet(Clear liquid)  AMBULATORY STATUS COMMUNICATION OF NEEDS Skin     Verbally Other (Comment)(MASD.)                       Personal Care Assistance Level of Assistance              Functional Limitations Info  Sight, Speech, Hearing Sight Info: Adequate Hearing Info: Adequate Speech Info: Adequate    SPECIAL CARE FACTORS FREQUENCY  Blood pressure                    Contractures      Additional Factors Info  Code Status, Allergies, Isolation Precautions Code Status Info: Full Allergies Info: NKDA     Isolation Precautions Info: Contact: MRSA     Current Medications (02/11/2018):  This is the current hospital active medication list Current Facility-Administered Medications  Medication Dose Route Frequency  Provider Last Rate Last Dose  . acetaminophen (TYLENOL) tablet 650 mg  650 mg Oral Q6H PRN Emokpae, Ejiroghene E, MD       Or  . acetaminophen (TYLENOL) suppository 650 mg  650 mg Rectal Q6H PRN Emokpae, Ejiroghene E, MD      . Chlorhexidine Gluconate Cloth 2 % PADS 6 each  6 each Topical Q0600 [place] Brick, Courage, MD   6 each at 02/11/18 0900  . lactulose (CHRONULAC) 10 GM/15ML solution 30  g  30 g Oral Q4H Emokpae, Courage, MD   30 g at 02/11/18 1409  . mupirocin ointment (BACTROBAN) 2 % 1 application  1 application Nasal BID Roxan Hockey, MD   1 application at 61/68/37 1122  . ondansetron (ZOFRAN) tablet 4 mg  4 mg Oral Q6H PRN Emokpae, Ejiroghene E, MD       Or  . ondansetron (ZOFRAN) injection 4 mg  4 mg Intravenous Q6H PRN Emokpae, Ejiroghene E, MD   4 mg at 02/11/18 0431  . pantoprazole (PROTONIX) 80 mg in sodium chloride 0.9 % 250 mL (0.32 mg/mL) infusion  8 mg/hr Intravenous Continuous Emokpae, Ejiroghene E, MD 25 mL/hr at 02/11/18 0541 8 mg/hr at 02/11/18 0541  . piperacillin-tazobactam (ZOSYN) IVPB 3.375 g  3.375 g Intravenous Q8H Erenest Blank, RPH 12.5 mL/hr at 02/11/18 1408 3.375 g at 02/11/18 1408  . sodium phosphate (FLEET) 7-19 GM/118ML enema 1 enema  1 enema Rectal Once PRN Emokpae, Ejiroghene E, MD         Discharge Medications: Please see discharge summary for a list of discharge medications.  Relevant Imaging Results:  Relevant Lab Results:   Additional [place], LCSW

## 2018-02-11 NOTE — ED Notes (Signed)
  Attempted to call report.  RN will call me back. 

## 2018-02-11 NOTE — Progress Notes (Signed)
Pt signed and held ordered dated 12/16/2017  Not from current admission, will not release at this time

## 2018-02-11 NOTE — Progress Notes (Signed)
Pharmacy Antibiotic Note  [name] is a 67 y.o. female admitted on 02/10/2018 with sepsis.  Pharmacy has been consulted for Zosyn dosing. R/O sepsis, ?UTI source. WBC elevated. Noted renal dysfunction.   Plan: Zosyn 3.375G IV q8h to be infused over 4 hours Trend WBC, temp, renal function  F/U infectious work-up   Height: 5' (152.4 cm) Weight: 147 lb 11.3 oz (67 kg) IBW/kg (Calculated) : 45.5  Temp (24hrs), Avg:97.9 F (36.6 C), Min:97.7 F (36.5 C), Max:98 F (36.7 C)  Recent Labs  Lab 02/08/18 1621 02/10/18 1514 02/10/18 2250  WBC 12.2* 22.7*  --   CREATININE 1.06* 1.43*  --   LATICACIDVEN  --   --  1.96*    Estimated Creatinine Clearance: 32.6 mL/min (A) (by C-G formula based on SCr of 1.43 mg/dL (H)).    No Known Allergies   Narda Bonds 02/11/2018 4:06 AM

## 2018-02-11 NOTE — Progress Notes (Signed)
Patient seen and evaluated, chart reviewed, please see EMR for updated orders. Please see full H&P dictated by admitting physician for same date of service.    67 y.o. female with medical history significant for rheumatoid arthritis, GERD, HTN, paraplegia neurogenic bladder depression, NICM diastolic CHF, chronic hepatitis C.  Admitted on 02/11/2018 with fecal impaction/obstipation----  Fecal impaction/obstipation -- with laxatives (lactulose)  patient has had 3 formed stools today , suspect she still has some way to go with regards to her bowel movements stools are brown, no evidence of blood.  No further emesis, start patient on a regimen of MiraLAX twice daily  Suspect some component of Stercoral colitis in the setting of fecal impaction and colon mucosa injury with  translocation of bacteria ----c/n zosyn as ordered by admitting provider  Possible UTI--continue Zosyn as ordered by admitting physician pending cultures, as noted the patient has neurogenic bladder  Intractable emesis--- most likely due to obstipation and possibly partly also due to UTI, no further emesis at this time, try liquid diet, transition Protonix to p.o. in a.m. if H&H is stable and no further evidence of Gi blood loss  Patient seen and evaluated, chart reviewed, please see EMR for updated orders. Please see full H&P dictated by admitting physician for same date of service.

## 2018-02-12 DIAGNOSIS — K5900 Constipation, unspecified: Secondary | ICD-10-CM

## 2018-02-12 LAB — BASIC METABOLIC PANEL
ANION GAP: 11 (ref 5–15)
BUN: 29 mg/dL — ABNORMAL HIGH (ref 8–23)
CHLORIDE: 111 mmol/L (ref 98–111)
CO2: 18 mmol/L — ABNORMAL LOW (ref 22–32)
Calcium: 8.8 mg/dL — ABNORMAL LOW (ref 8.9–10.3)
Creatinine, Ser: 1.34 mg/dL — ABNORMAL HIGH (ref 0.44–1.00)
GFR calc Af Amer: 46 mL/min — ABNORMAL LOW (ref >60)
GFR calc non Af Amer: 40 mL/min — ABNORMAL LOW (ref >60)
Glucose, Bld: 93 mg/dL (ref 70–99)
POTASSIUM: 3.7 mmol/L (ref 3.5–5.1)
SODIUM: 140 mmol/L (ref 135–145)

## 2018-02-12 LAB — HIV ANTIBODY (ROUTINE TESTING W REFLEX): HIV SCREEN 4TH GENERATION: NONREACTIVE

## 2018-02-12 LAB — URINE CULTURE

## 2018-02-12 LAB — CBC
HCT: 30.8 % — ABNORMAL LOW (ref 36.0–46.0)
HEMOGLOBIN: 9.3 g/dL — AB (ref 12.0–15.0)
MCH: 23.2 pg — ABNORMAL LOW (ref 26.0–34.0)
MCHC: 30.2 g/dL (ref 30.0–36.0)
MCV: 76.8 fL — ABNORMAL LOW (ref 78.0–100.0)
Platelets: 239 10*3/uL (ref 150–400)
RBC: 4.01 MIL/uL (ref 3.87–5.11)
RDW: 19.9 % — ABNORMAL HIGH (ref 11.5–15.5)
WBC: 10.7 10*3/uL — AB (ref 4.0–10.5)

## 2018-02-12 MED ORDER — BISACODYL 10 MG RE SUPP
10.0000 mg | Freq: Once | RECTAL | Status: AC
  Administered 2018-02-12: 10 mg via RECTAL
  Filled 2018-02-12: qty 1

## 2018-02-12 MED ORDER — BACLOFEN 5 MG HALF TABLET
10.0000 mg | ORAL_TABLET | Freq: Three times a day (TID) | ORAL | Status: DC | PRN
  Administered 2018-02-12 – 2018-02-14 (×4): 10 mg via ORAL
  Filled 2018-02-12 (×4): qty 2

## 2018-02-12 MED ORDER — PANTOPRAZOLE SODIUM 40 MG PO TBEC
40.0000 mg | DELAYED_RELEASE_TABLET | Freq: Every day | ORAL | Status: DC
  Administered 2018-02-13 – 2018-02-14 (×2): 40 mg via ORAL
  Filled 2018-02-12 (×2): qty 1

## 2018-02-12 NOTE — Progress Notes (Signed)
Patient requesting medication for muscle spasms, orders entered by on call NP Georgina Quint RN 2:06 AM 02-12-2018

## 2018-02-12 NOTE — Progress Notes (Signed)
Patient Demographics:    [name], is a 67 y.o. female, DOB - 07-May-1951, UTM:[id number]  Admit date - 02/10/2018   Admitting Physician Ejiroghene Arlyce Dice, MD  Outpatient Primary MD for the patient is Salome Arnt, [place]  LOS - 1   Chief Complaint  Patient presents with  . Hematemesis        Subjective:    [name] today has no fevers, no emesis,  No chest pain, she had at least 5 bowel movements after laxatives and enemas, no further emesis, no abdominal pain, no fevers  Assessment  & Plan :    Principal Problem:   Obstipation/fecal impaction Active Problems:   Rheumatoid arthritis (HCC)   Essential hypertension   Neurogenic bladder   Paraplegia (HCC)   CHF (congestive heart failure) (HCC)   NICM (nonischemic cardiomyopathy) (HCC)   Intractable vomiting  Brief Summary 67 y.o.femalewith medical history significantforrheumatoid arthritis, GERD, HTN,paraplegia neurogenic bladder depression,NICMdiastolic CHF,chronic hepatitis C. Admitted on 02/11/2018 with intractable emesis in the setting of fecal impaction/obstipation----  Plan:- 1)Fecal Impaction/Obstipation -- much improved with laxatives (lactulose) and enema,  patient had at least 5 bowel movement since admission ,  her bowel movements stools are brown, no evidence of blood.  No further emesis,  continue MiraLAX twice daily Suspect some component of Stercoral colitis in the setting of fecal impaction and colon mucosa injury with  translocation of bacteria ----c/n zosyn as ordered by admitting provider, WBC is down to 10 from 22, afebrile  2)Possible UTI--continue Zosyn as ordered by admitting physician pending cultures, as noted the patient has neurogenic bladder  3)Intractable Emesis--- most likely due to obstipation and possibly partly also due to UTI, no further emesis at this time,  advance diet to full liquid  diet, transition Protonix to p.o. in a.m. if H&H is stable and no further evidence of Gi blood loss  4)Recurrent right-sided pleural effusion----status post Prior Pleurx catheter placement, right-sided pleural effusion is resolved, apparently patient was scheduled to have this catheter removed, will consult interventional radiology   5)Paraplegic with neurogenic bladder and neurogenic bowel--- continue home regimen   Code Status : full    Disposition Plan  : SNF  Consults  :  IR to remove right-sided Pleurx catheter   DVT Prophylaxis  :  SCDs/TEDs  Lab Results  Component Value Date   PLT 239 02/12/2018    Inpatient Medications  Scheduled Meds: . bisacodyl  10 mg Rectal Once  . Chlorhexidine Gluconate Cloth  6 each Topical Q0600  . mupirocin ointment  1 application Nasal BID  . polyethylene glycol  17 g Oral BID   Continuous Infusions: . pantoprozole (PROTONIX) infusion 8 mg/hr (02/11/18 2142)  . piperacillin-tazobactam (ZOSYN)  IV 3.375 g (02/12/18 1403)   PRN Meds:.acetaminophen **OR** acetaminophen, baclofen, ondansetron **OR** ondansetron (ZOFRAN) IV, sodium phosphate    Anti-infectives (From admission, onward)   Start     Dose/Rate Route Frequency Ordered Stop   02/11/18 0600  cefTRIAXone (ROCEPHIN) 1 g in sodium chloride 0.9 % 100 mL IVPB  Status:  Discontinued     1 g 200 mL/hr over 30 Minutes Intravenous Every 24 hours 02/11/18 0319 02/11/18 0402   02/11/18 0600  piperacillin-tazobactam (ZOSYN) IVPB 3.375 g  3.375 g 12.5 mL/hr over 240 Minutes Intravenous Every 8 hours 02/11/18 0404     02/10/18 2330  vancomycin (VANCOCIN) 1,250 mg in sodium chloride 0.9 % 250 mL IVPB     1,250 mg 166.7 mL/hr over 90 Minutes Intravenous  Once 02/10/18 2227 02/11/18 0047   02/10/18 2215  piperacillin-tazobactam (ZOSYN) IVPB 3.375 g     3.375 g 100 mL/hr over 30 Minutes Intravenous  Once 02/10/18 2212 02/10/18 2343   02/10/18 2215  vancomycin (VANCOCIN) IVPB 1000 mg/200 mL  premix  Status:  Discontinued     1,000 mg 200 mL/hr over 60 Minutes Intravenous  Once 02/10/18 2212 02/10/18 2227        Objective:   Vitals:   02/11/18 2019 02/12/18 0122 02/12/18 0405 02/12/18 0407  BP: 110/69 (!) 105/59  (!) 84/49  Pulse: 82 75  72  Resp: 16 17  17   Temp: 98.6 F (37 C) 97.8 F (36.6 C)  98.4 F (36.9 C)  TempSrc: Oral Oral  Oral  SpO2: 98% 100%  100%  Weight:   69.7 kg (153 lb 10.6 oz)   Height:        Wt Readings from Last 3 Encounters:  02/12/18 69.7 kg (153 lb 10.6 oz)  11/03/17 70.8 kg (156 lb)  10/26/17 70.8 kg (156 lb)     Intake/Output Summary (Last 24 hours) at 02/12/2018 1610 Last data filed at 02/12/2018 0600 Gross per 24 hour  Intake 1206.42 ml  Output 500 ml  Net 706.42 ml     Physical Exam  Gen:- Awake Alert,  In no apparent distress  HEENT:- [place].AT, No sclera icterus Neck-Supple Neck,No JVD,.  Lungs-  CTAB , right-sided Pleurx catheter in situ CV- S1, S2 normal Abd-  +ve B.Sounds, Abd Soft, No tenderness,    Extremity/Skin:- No  edema,   warm and dry Psych-affect is appropriate, oriented x3 Neuro-paraplegia, not new, no tremors   Data Review:   Micro Results Recent Results (from the past 240 hour(s))  Urine Culture     Status: Abnormal   Collection Time: 02/10/18  7:39 PM  Result Value Ref Range Status   Specimen Description URINE, CATHETERIZED  Final   Special Requests   Final    NONE Performed at [facility], [place] [address], [place], [place] [address]    Culture MULTIPLE SPECIES PRESENT, SUGGEST RECOLLECTION (A)  Final   Report Status 02/12/2018 FINAL  Final  Blood Culture (routine x 2)     Status: None (Preliminary result)   Collection Time: 02/10/18 10:35 PM  Result Value Ref Range Status   Specimen Description BLOOD RIGHT ANTECUBITAL  Final   Special Requests   Final    BOTTLES DRAWN AEROBIC AND ANAEROBIC Blood Culture adequate volume   Culture   Final    NO GROWTH 1 DAY Performed at [facility], [place] [address] [place], [place] [address]    Report Status PENDING  Incomplete  Blood Culture (routine x 2)     Status: None (Preliminary result)   Collection Time: 02/10/18 10:40 PM  Result Value Ref Range Status   Specimen Description BLOOD RIGHT HAND  Final   Special Requests   Final    BOTTLES DRAWN AEROBIC AND ANAEROBIC Blood Culture adequate volume   Culture   Final    NO GROWTH 1 DAY Performed at [facility], [place] [address], [place], [place] [address]    Report Status PENDING  Incomplete  MRSA  PCR Screening     Status: Abnormal   Collection Time: 02/11/18  4:58 AM  Result Value Ref Range Status   MRSA by PCR POSITIVE (A) NEGATIVE Final    Comment:        The GeneXpert MRSA Assay (FDA approved for NASAL specimens only), is one component of a comprehensive MRSA colonization surveillance program. It is not intended to diagnose MRSA infection nor to guide or monitor treatment for MRSA infections. RESULT CALLED TO, READ BACK BY AND VERIFIED WITH: RN Derryl Harbor [phone] MLM Performed at [facility], [place] [address], [place], [place] [address]     Radiology Reports Ct Abdomen Pelvis W Contrast  Result Date: 02/10/2018 CLINICAL DATA:  Hematemesis. EXAM: CT ABDOMEN AND PELVIS WITH CONTRAST TECHNIQUE: Multidetector CT imaging of the abdomen and pelvis was performed using the standard protocol following bolus administration of intravenous contrast. CONTRAST:  64m OMNIPAQUE IOHEXOL 300 MG/ML  SOLN COMPARISON:  CT abdomen pelvis dated August 09, 2017. FINDINGS: Lower chest: No acute abnormality. Hypodensity in the region of the distal right pulmonary artery on the very first axial image likely represents hilar soft tissue, similar to prior CT chest from 06/28/2017 (series 2, image 59). Right-sided PleurX catheter in place. Right lower lobe subsegmental atelectasis. Moderate hiatal hernia, unchanged. Hepatobiliary: Nodular liver surface, consistent with  cirrhosis. No focal liver abnormality. Status post cholecystectomy. No biliary dilatation. Pancreas: Unremarkable. No pancreatic ductal dilatation or surrounding inflammatory changes. Spleen: Unchanged rim calcified cystic lesion in the lower pole of the spleen measuring up to 8.9 cm. Adrenals/Urinary Tract: The adrenal glands are unremarkable. Subcentimeter low-density lesions in both kidneys remain too small to characterize, but are unchanged. New mild right hydroureteronephrosis. No renal or ureteral calculi. Moderately distended bladder. Stomach/Bowel: Large amount of stool in the rectosigmoid colon. No bowel wall thickening, distention, or surrounding inflammatory changes. Vascular/Lymphatic: Aortic atherosclerosis. No enlarged abdominal or pelvic lymph nodes. Reproductive: Calcified uterine fibroids are unchanged. No adnexal mass. Other: No free fluid or pneumoperitoneum. Musculoskeletal: No acute osseous findings. IMPRESSION: 1. Large amount of stool in the rectosigmoid colon. Correlate for constipation and fecal impaction. 2. New mild right hydroureteronephrosis, likely related to distal ureteral obstruction from rectosigmoid distention. 3. Cirrhosis. 4. 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 5. Unchanged moderate hiatal hernia. 6.  Aortic atherosclerosis (ICD10-I70.0). Electronically Signed   By: WTitus DubinM.D.   On: 02/10/2018 18:50     CBC Recent Labs  Lab 02/08/18 1621 02/10/18 1514 02/11/18 0349 02/11/18 0945 02/12/18 0550  WBC 12.2* 22.7* 19.0* 15.2* 10.7*  HGB 12.1 12.9 10.8* 9.6* 9.3*  HCT 37.4 41.2 35.6* 31.1* 30.8*  PLT 383 331 276 272 239  MCV 72.2* 74.6* 76.1* 74.9* 76.8*  MCH 23.4* 23.4* 23.1* 23.1* 23.2*  MCHC 32.4 31.3 30.3 30.9 30.2  RDW 18.7* 19.5* 19.7* 19.5* 19.9*    Chemistries  Recent Labs  Lab 02/08/18 1621 02/10/18 1514 02/11/18 0945 02/12/18 0550  NA 127* 131* 138 140  K 4.4 4.1 3.3* 3.7  CL 96* 93* 107 111  CO2 20 24 20*  18*  GLUCOSE 109* 109* 88 93  BUN 48* 50* 44* 29*  CREATININE 1.06* 1.43* 1.23* 1.34*  CALCIUM 9.5 10.2 8.6* 8.8*  AST 13 18  --   --   ALT 16 21  --   --   ALKPHOS  --  96  --   --   BILITOT 0.5 0.7  --   --    ------------------------------------------------------------------------------------------------------------------  No results for input(s): CHOL, HDL, LDLCALC, TRIG, CHOLHDL, LDLDIRECT in the last 72 hours.  Lab Results  Component Value Date   HGBA1C 6.1 (H) 06/11/2016   ------------------------------------------------------------------------------------------------------------------ No results for input(s): TSH, T4TOTAL, T3FREE, THYROIDAB in the last 72 hours.  Invalid input(s): FREET3 ------------------------------------------------------------------------------------------------------------------ No results for input(s): VITAMINB12, FOLATE, FERRITIN, TIBC, IRON, RETICCTPCT in the last 72 hours.  Coagulation profile Recent Labs  Lab 02/10/18 1514  INR 1.37    No results for input(s): DDIMER in the last 72 hours.  Cardiac Enzymes Recent Labs  Lab 02/11/18 0349 02/11/18 0945 02/11/18 1526  TROPONINI 0.07* 0.03* 0.03*   ------------------------------------------------------------------------------------------------------------------    Component Value Date/Time   BNP 296.5 (H) 07/01/2017 2863     Roxan Hockey M.D on 02/12/2018 at 4:10 PM   Go to [email or web address] - password [email or web address] for contact info  [facility] - Office  [phone]

## 2018-02-13 NOTE — Progress Notes (Signed)
Patient Demographics:    [name], is a 67 y.o. female, DOB - Aug 25, 1950, KAJ:[id number]  Admit date - 02/10/2018   Admitting Physician Ejiroghene Arlyce Dice, MD  Outpatient Primary MD for the patient is Salome Arnt, [place]  LOS - 2   Chief Complaint  Patient presents with  . Hematemesis        Subjective:    [name] today has no fevers, no emesis,  No chest pain,  no further emesis, no abdominal pain, no fevers, patient continues to have bowel movements, she has had over 20 bowel movements since admission with laxatives and enemas  Assessment  & Plan :    Principal Problem:   Obstipation/fecal impaction Active Problems:   Rheumatoid arthritis ([place])   Essential hypertension   Neurogenic bladder   Paraplegia (HCC)   CHF (congestive heart failure) (HCC)   NICM (nonischemic cardiomyopathy) (HCC)   Intractable vomiting  Brief Summary 67 y.o.femalewith medical history significantforrheumatoid arthritis, GERD, HTN,paraplegia neurogenic bladder depression,NICMdiastolic CHF,chronic hepatitis C. Admitted on 02/11/2018 with intractable emesis in the setting of fecal impaction/obstipation---- she has had over 20 bowel movements since admission with laxatives and enemas   Plan:- 1)Fecal Impaction/Obstipation -- much improved with laxatives (lactulose) and enema,  she has had over 20 bowel movements since admission with laxatives and enemas,  her bowel movements stools are brown, no evidence of blood.  No further emesis,  continue MiraLAX twice daily, Suspect some component of Stercoral colitis in the setting of fecal impaction and colon mucosa injury with  translocation of bacteria ----c/n zosyn as ordered by admitting provider, WBC is down to 10 from 22, afebrile  2)Possible UTI--urine cultures without definite growth, patient has  neurogenic bladder  3)Intractable Emesis--- most  likely due to obstipation and possibly partly also due to UTI, no further emesis at this time,  advance diet to solids, continue Protonix .   no further evidence of Gi blood loss  4)Recurrent right-sided pleural effusion----status post Prior Pleurx catheter placement, right-sided pleural effusion is resolved, apparently patient was scheduled to have this catheter removed, will consult interventional radiology   5)Paraplegic with neurogenic bladder and neurogenic bowel--- continue home regimen   Code Status : full    Disposition Plan  : SNF  Consults  :  IR to remove right-sided Pleurx catheter   DVT Prophylaxis  :  SCDs/TEDs  Lab Results  Component Value Date   PLT 239 02/12/2018    Inpatient Medications  Scheduled Meds: . Chlorhexidine Gluconate Cloth  6 each Topical Q0600  . mupirocin ointment  1 application Nasal BID  . pantoprazole  40 mg Oral Daily  . polyethylene glycol  17 g Oral BID   Continuous Infusions: . piperacillin-tazobactam (ZOSYN)  IV 3.375 g (02/13/18 1606)   PRN Meds:.acetaminophen **OR** acetaminophen, baclofen, ondansetron **OR** ondansetron (ZOFRAN) IV, sodium phosphate    Anti-infectives (From admission, onward)   Start     Dose/Rate Route Frequency Ordered Stop   02/11/18 0600  cefTRIAXone (ROCEPHIN) 1 g in sodium chloride 0.9 % 100 mL IVPB  Status:  Discontinued     1 g 200 mL/hr over 30 Minutes Intravenous Every 24 hours 02/11/18 0319 02/11/18 0402   02/11/18 0600  piperacillin-tazobactam (ZOSYN) IVPB 3.375 g  3.375 g 12.5 mL/hr over 240 Minutes Intravenous Every 8 hours 02/11/18 0404     02/10/18 2330  vancomycin (VANCOCIN) 1,250 mg in sodium chloride 0.9 % 250 mL IVPB     1,250 mg 166.7 mL/hr over 90 Minutes Intravenous  Once 02/10/18 2227 02/11/18 0047   02/10/18 2215  piperacillin-tazobactam (ZOSYN) IVPB 3.375 g     3.375 g 100 mL/hr over 30 Minutes Intravenous  Once 02/10/18 2212 02/10/18 2343   02/10/18 2215  vancomycin (VANCOCIN)  IVPB 1000 mg/200 mL premix  Status:  Discontinued     1,000 mg 200 mL/hr over 60 Minutes Intravenous  Once 02/10/18 2212 02/10/18 2227        Objective:   Vitals:   02/12/18 2207 02/13/18 0640 02/13/18 0955 02/13/18 1500  BP: (!) 97/59 115/70 124/75 94/73  Pulse: 73 72 87 73  Resp: 18 18 16 18   Temp: 98.4 F (36.9 C) 98.9 F (37.2 C) 98.7 F (37.1 C) 98.6 F (37 C)  TempSrc: Oral Oral Oral Oral  SpO2: 98% 99% 100% 100%  Weight:  68.3 kg (150 lb 9.2 oz)    Height:        Wt Readings from Last 3 Encounters:  02/13/18 68.3 kg (150 lb 9.2 oz)  11/03/17 70.8 kg (156 lb)  10/26/17 70.8 kg (156 lb)     Intake/Output Summary (Last 24 hours) at 02/13/2018 1828 Last data filed at 02/13/2018 1300 Gross per 24 hour  Intake 720 ml  Output 0 ml  Net 720 ml     Physical Exam  Gen:- Awake Alert,  In no apparent distress  HEENT:- [place].AT, No sclera icterus Neck-Supple Neck,No JVD,.  Lungs-  CTAB , right-sided Pleurx catheter in situ CV- S1, S2 normal Abd-  +ve B.Sounds, Abd Soft, No tenderness,    Extremity/Skin:-   warm and dry,,old ulcer Psych-affect is appropriate, oriented x3 Neuro-paraplegia, not new, no tremors   Data Review:   Micro Results Recent Results (from the past 240 hour(s))  Urine Culture     Status: Abnormal   Collection Time: 02/10/18  7:39 PM  Result Value Ref Range Status   Specimen Description URINE, CATHETERIZED  Final   Special Requests   Final    NONE Performed at [facility], [place] [address] [place], [place] [address]    Culture MULTIPLE SPECIES PRESENT, SUGGEST RECOLLECTION (A)  Final   Report Status 02/12/2018 FINAL  Final  Blood Culture (routine x 2)     Status: None (Preliminary result)   Collection Time: 02/10/18 10:35 PM  Result Value Ref Range Status   Specimen Description BLOOD RIGHT ANTECUBITAL  Final   Special Requests   Final    BOTTLES DRAWN AEROBIC AND ANAEROBIC Blood Culture adequate volume   Culture   Final    NO  GROWTH 2 DAYS Performed at [facility], [place] [address] [place], [place] [address]    Report Status PENDING  Incomplete  Blood Culture (routine x 2)     Status: None (Preliminary result)   Collection Time: 02/10/18 10:40 PM  Result Value Ref Range Status   Specimen Description BLOOD RIGHT HAND  Final   Special Requests   Final    BOTTLES DRAWN AEROBIC AND ANAEROBIC Blood Culture adequate volume   Culture   Final    NO GROWTH 2 DAYS Performed at [facility], [place] [address], [place], [place] [address]    Report Status PENDING  Incomplete  MRSA  PCR Screening     Status: Abnormal   Collection Time: 02/11/18  4:58 AM  Result Value Ref Range Status   MRSA by PCR POSITIVE (A) NEGATIVE Final    Comment:        The GeneXpert MRSA Assay (FDA approved for NASAL specimens only), is one component of a comprehensive MRSA colonization surveillance program. It is not intended to diagnose MRSA infection nor to guide or monitor treatment for MRSA infections. RESULT CALLED TO, READ BACK BY AND VERIFIED WITH: RN Derryl Harbor [phone] MLM Performed at [facility], [place] [address] [place], [place] [address]     Radiology Reports Ct Abdomen Pelvis W Contrast  Result Date: 02/10/2018 CLINICAL DATA:  Hematemesis. EXAM: CT ABDOMEN AND PELVIS WITH CONTRAST TECHNIQUE: Multidetector CT imaging of the abdomen and pelvis was performed using the standard protocol following bolus administration of intravenous contrast. CONTRAST:  58m OMNIPAQUE IOHEXOL 300 MG/ML  SOLN COMPARISON:  CT abdomen pelvis dated August 09, 2017. FINDINGS: Lower chest: No acute abnormality. Hypodensity in the region of the distal right pulmonary artery on the very first axial image likely represents hilar soft tissue, similar to prior CT chest from 06/28/2017 (series 2, image 59). Right-sided PleurX catheter in place. Right lower lobe subsegmental atelectasis. Moderate hiatal hernia, unchanged. Hepatobiliary: Nodular  liver surface, consistent with cirrhosis. No focal liver abnormality. Status post cholecystectomy. No biliary dilatation. Pancreas: Unremarkable. No pancreatic ductal dilatation or surrounding inflammatory changes. Spleen: Unchanged rim calcified cystic lesion in the lower pole of the spleen measuring up to 8.9 cm. Adrenals/Urinary Tract: The adrenal glands are unremarkable. Subcentimeter low-density lesions in both kidneys remain too small to characterize, but are unchanged. New mild right hydroureteronephrosis. No renal or ureteral calculi. Moderately distended bladder. Stomach/Bowel: Large amount of stool in the rectosigmoid colon. No bowel wall thickening, distention, or surrounding inflammatory changes. Vascular/Lymphatic: Aortic atherosclerosis. No enlarged abdominal or pelvic lymph nodes. Reproductive: Calcified uterine fibroids are unchanged. No adnexal mass. Other: No free fluid or pneumoperitoneum. Musculoskeletal: No acute osseous findings. IMPRESSION: 1. Large amount of stool in the rectosigmoid colon. Correlate for constipation and fecal impaction. 2. New mild right hydroureteronephrosis, likely related to distal ureteral obstruction from rectosigmoid distention. 3. Cirrhosis. 4. 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 5. Unchanged moderate hiatal hernia. 6.  Aortic atherosclerosis (ICD10-I70.0). Electronically Signed   By: WTitus DubinM.D.   On: 02/10/2018 18:50     CBC Recent Labs  Lab 02/08/18 1621 02/10/18 1514 02/11/18 0349 02/11/18 0945 02/12/18 0550  WBC 12.2* 22.7* 19.0* 15.2* 10.7*  HGB 12.1 12.9 10.8* 9.6* 9.3*  HCT 37.4 41.2 35.6* 31.1* 30.8*  PLT 383 331 276 272 239  MCV 72.2* 74.6* 76.1* 74.9* 76.8*  MCH 23.4* 23.4* 23.1* 23.1* 23.2*  MCHC 32.4 31.3 30.3 30.9 30.2  RDW 18.7* 19.5* 19.7* 19.5* 19.9*    Chemistries  Recent Labs  Lab 02/08/18 1621 02/10/18 1514 02/11/18 0945 02/12/18 0550  NA 127* 131* 138 140  K 4.4 4.1 3.3* 3.7  CL  96* 93* 107 111  CO2 20 24 20* 18*  GLUCOSE 109* 109* 88 93  BUN 48* 50* 44* 29*  CREATININE 1.06* 1.43* 1.23* 1.34*  CALCIUM 9.5 10.2 8.6* 8.8*  AST 13 18  --   --   ALT 16 21  --   --   ALKPHOS  --  96  --   --   BILITOT 0.5 0.7  --   --    ------------------------------------------------------------------------------------------------------------------  No results for input(s): CHOL, HDL, LDLCALC, TRIG, CHOLHDL, LDLDIRECT in the last 72 hours.  Lab Results  Component Value Date   HGBA1C 6.1 (H) 06/11/2016   ------------------------------------------------------------------------------------------------------------------ No results for input(s): TSH, T4TOTAL, T3FREE, THYROIDAB in the last 72 hours.  Invalid input(s): FREET3 ------------------------------------------------------------------------------------------------------------------ No results for input(s): VITAMINB12, FOLATE, FERRITIN, TIBC, IRON, RETICCTPCT in the last 72 hours.  Coagulation profile Recent Labs  Lab 02/10/18 1514  INR 1.37    No results for input(s): DDIMER in the last 72 hours.  Cardiac Enzymes Recent Labs  Lab 02/11/18 0349 02/11/18 0945 02/11/18 1526  TROPONINI 0.07* 0.03* 0.03*   ------------------------------------------------------------------------------------------------------------------    Component Value Date/Time   BNP 296.5 (H) 07/01/2017 0623     Roxan Hockey M.D on 02/13/2018 at 6:28 PM   Go to [email or web address] - password [email or web address] for contact info  [facility] - Office  [phone]

## 2018-02-14 ENCOUNTER — Inpatient Hospital Stay (HOSPITAL_COMMUNITY): Payer: Medicare Other

## 2018-02-14 ENCOUNTER — Encounter (HOSPITAL_COMMUNITY): Payer: Self-pay | Admitting: Radiology

## 2018-02-14 DIAGNOSIS — B1920 Unspecified viral hepatitis C without hepatic coma: Secondary | ICD-10-CM

## 2018-02-14 HISTORY — PX: IR REMOVAL OF PLURAL CATH W/CUFF: IMG5346

## 2018-02-14 HISTORY — DX: Unspecified viral hepatitis C without hepatic coma: B19.20

## 2018-02-14 LAB — BASIC METABOLIC PANEL
Anion gap: 9 (ref 5–15)
BUN: 6 mg/dL — ABNORMAL LOW (ref 8–23)
CHLORIDE: 108 mmol/L (ref 98–111)
CO2: 20 mmol/L — AB (ref 22–32)
CREATININE: 0.74 mg/dL (ref 0.44–1.00)
Calcium: 8.4 mg/dL — ABNORMAL LOW (ref 8.9–10.3)
GFR calc non Af Amer: >60 mL/min (ref >60)
Glucose, Bld: 70 mg/dL (ref 70–99)
POTASSIUM: 3.9 mmol/L (ref 3.5–5.1)
SODIUM: 137 mmol/L (ref 135–145)

## 2018-02-14 LAB — CBC
HEMATOCRIT: 30.8 % — AB (ref 36.0–46.0)
Hemoglobin: 9.2 g/dL — ABNORMAL LOW (ref 12.0–15.0)
MCH: 23.4 pg — AB (ref 26.0–34.0)
MCHC: 29.9 g/dL — ABNORMAL LOW (ref 30.0–36.0)
MCV: 78.2 fL (ref 78.0–100.0)
PLATELETS: 200 10*3/uL (ref 150–400)
RBC: 3.94 MIL/uL (ref 3.87–5.11)
RDW: 19.1 % — ABNORMAL HIGH (ref 11.5–15.5)
WBC: 7.4 10*3/uL (ref 4.0–10.5)

## 2018-02-14 MED ORDER — CHLORHEXIDINE GLUCONATE 4 % EX LIQD
CUTANEOUS | Status: AC
  Filled 2018-02-14: qty 15

## 2018-02-14 MED ORDER — CARVEDILOL 6.25 MG PO TABS: 6.2500 mg | ORAL_TABLET | Freq: Two times a day (BID) | ORAL | 3 refills | Status: DC

## 2018-02-14 MED ORDER — AMOXICILLIN-POT CLAVULANATE 875-125 MG PO TABS: 1.0000 | ORAL_TABLET | Freq: Two times a day (BID) | ORAL | 0 refills | Status: AC

## 2018-02-14 MED ORDER — TRAMADOL HCL 50 MG PO TABS: 50.0000 mg | ORAL_TABLET | Freq: Four times a day (QID) | ORAL | 0 refills | Status: DC | PRN

## 2018-02-14 MED ORDER — SENNA-DOCUSATE SODIUM 8.6-50 MG PO TABS
2.0000 | ORAL_TABLET | Freq: Two times a day (BID) | ORAL | 3 refills | Status: DC
Start: 2018-02-14 — End: 2021-12-26

## 2018-02-14 MED ORDER — BISACODYL 10 MG RE SUPP: 10.0000 mg | RECTAL | 3 refills | Status: AC

## 2018-02-14 MED ORDER — AMOXICILLIN-POT CLAVULANATE 875-125 MG PO TABS
1.0000 | ORAL_TABLET | Freq: Two times a day (BID) | ORAL | Status: DC
  Administered 2018-02-14: 1 via ORAL
  Filled 2018-02-14: qty 1

## 2018-02-14 MED ORDER — LIDOCAINE HCL (PF) 1 % IJ SOLN
INTRAMUSCULAR | Status: AC
  Filled 2018-02-14: qty 30

## 2018-02-14 MED ORDER — SPIRONOLACTONE 100 MG PO TABS
100.0000 mg | ORAL_TABLET | Freq: Every day | ORAL | 2 refills | Status: DC
Start: 2018-02-14 — End: 2021-07-28

## 2018-02-14 MED ORDER — POLYETHYLENE GLYCOL 3350 17 G PO PACK: 17.0000 g | PACK | ORAL | 5 refills | Status: DC

## 2018-02-14 MED FILL — HARVONI 90-400 MG TABLET: 90-400 | 28 days supply | Qty: 28 | Fill #1

## 2018-02-14 MED FILL — RIBAVIRIN 200 MG TABLET: 200 | 28 days supply | Qty: 84 | Fill #1

## 2018-02-14 NOTE — Progress Notes (Signed)
Barrier cream applied to pt's bottom per MD order.

## 2018-02-14 NOTE — Clinical Social Work Note (Signed)
CSW consulted that patient is concerned she is not receiving correct medications at SNF. SNF hospital liaison notified and confirmed they will follow MD medication orders on discharge summary.  Dayton Scrape, [place]

## 2018-02-14 NOTE — Procedures (Signed)
Successful removal of (R)tunneled PleurX catheter No complications.  Ascencion Dike PA-C Interventional Radiology 02/14/2018 4:38 PM

## 2018-02-14 NOTE — Progress Notes (Signed)
Pharmacy Antibiotic Note  [name] is a 67 y.o. female admitted on 02/10/2018 with sepsis.  Pharmacy has been consulted for Zosyn dosing. R/O sepsis, ?UTI source. WBC WNL. Noted renal dysfunction with neurogenic bladder. Would consider de-escalating therapy soon.  Plan: Zosyn 3.375G IV q8h to be infused over 4 hours Trend WBC, temp, renal function  F/U infectious work-up   Height: 5' (152.4 cm) Weight: 150 lb 2.1 oz (68.1 kg) IBW/kg (Calculated) : 45.5  Temp (24hrs), Avg:98.6 F (37 C), Min:98.4 F (36.9 C), Max:98.8 F (37.1 C)  Recent Labs  Lab 02/08/18 1621 02/10/18 1514 02/10/18 2250 02/11/18 0349 02/11/18 0945 02/12/18 0550 02/14/18 0705  WBC 12.2* 22.7*  --  19.0* 15.2* 10.7* 7.4  CREATININE 1.06* 1.43*  --   --  1.23* 1.34* 0.74  LATICACIDVEN  --   --  1.96*  --   --   --   --     Estimated Creatinine Clearance: 58.7 mL/min (by C-G formula based on SCr of 0.74 mg/dL).    No Known Allergies   [name] 02/14/2018 10:20 AM

## 2018-02-14 NOTE — Clinical Social Work Note (Signed)
CSW facilitated patient discharge including contacting patient family (RN will notify) and facility to confirm patient discharge plans. Clinical information faxed to facility and family agreeable with plan. CSW arranged ambulance transport via [facility] to [facility]. RN to call report prior to discharge ([phone]).  CSW will sign off for now as social work intervention is no longer needed. Please consult [place] again if new needs arise.  Dayton Scrape, [place]

## 2018-02-14 NOTE — Progress Notes (Signed)
Report called to [facility], spoke to [facility] Pt telemetry removed. Peri care completed and pt changed into transport gown. Pt sister at bedside, provided AVS. Pt prescriptions signed and placed in transport envelope. Awaiting [facility]

## 2018-02-14 NOTE — Discharge Summary (Addendum)
[name], is a 67 y.o. female  DOB 24-Feb-1951  MRN [id number].  Admission date:  02/10/2018  Admitting Physician  Bethena Roys, MD  Discharge Date:  02/14/2018   Primary MD  Salome Arnt, PA  Recommendations for primary care physician for things to follow:   1) patient with chronic constipation due to neurogenic bowel-----give MiraLAX 17 gm once every Monday Wednesday Fridays and Saturdays, give senna S----2 tablets twice a day and please give Dulcolax suppository every Tuesdays Thursdays and Saturdays and Sundays--- if no bowel movement in 48 hours please notify provider  2) repeat CBC and BMP within 1 week  3) low-salt diet advised  4) continue local wound care to left foot big toe wound  5) leave right chest wall dressing on for at least 24 hours, keep previous right Pleurx catheter site clean dry and intact for at least 24 hours   Admission Diagnosis  Fecal impaction ([place]) [K56.41] AKI (acute kidney injury) ([place]) [N17.9] Sepsis, due to unspecified organism ([place]) [A41.9] Nausea and vomiting, intractability of vomiting not specified, unspecified vomiting type [R11.2]   Discharge Diagnosis  Fecal impaction ([place]) [K56.41] AKI (acute kidney injury) ([place]) [N17.9] Sepsis, due to unspecified organism ([place]) [A41.9] Nausea and vomiting, intractability of vomiting not specified, unspecified vomiting type [R11.2]    Principal Problem:   Obstipation/fecal impaction Active Problems:   Rheumatoid arthritis ([place])   Essential hypertension   Neurogenic bladder   Paraplegia (HCC)   CHF (congestive heart failure) ([place])   NICM (nonischemic cardiomyopathy) ([place])   Intractable vomiting      Past Medical History:  Diagnosis Date  . Acute systolic CHF (congestive heart failure) ([place]) 06/28/2017   EF was normal 2016, now 20% with grade 2 diastolic dysfunction, no significant CAD at cath  .  Anemia 10/12/2008  . CKD (chronic kidney disease)   . Depression   . Diaphragmatic hernia without mention of obstruction or gangrene 08/29/2010  . Edema 05/20/2009  . Gastroparesis 10/05/2010  . GERD (gastroesophageal reflux disease) 02/02/2009  . Hx of colonic polyps   . Hypertension 10/12/2008  . Hypotension, unspecified 05/20/2009  . Immobility syndrome (paraplegic) 1977  . Iron deficiency anemia, unspecified 10/12/2008  . Leiomyoma of uterus   . Malignant neoplasm of breast (female), unspecified site 10/05/2010   2003, 2013   . Paraplegia ([place])   . Pleural effusion   . Pneumonia   . Respiratory failure ([place])   . Rheumatoid arthritis ([place]) 01/17/2009    Past Surgical History:  Procedure Laterality Date  . [place]  . BREAST SURGERY  2003   Bilateral mastectomy  . IR FLUORO GUIDE CV LINE RIGHT  06/23/2017  . IR REMOVAL OF PLURAL CATH W/CUFF  02/14/2018  . IR REMOVAL TUN CV CATH W/O FL  07/15/2017  . IR THORACENTESIS ASP PLEURAL SPACE W/IMG GUIDE  07/30/2017  . IR US GUIDE VASC ACCESS RIGHT  06/23/2017  . PRESSURE ULCER DEBRIDEMENT  2009   on back  .  RIGHT/LEFT HEART CATH AND CORONARY ANGIOGRAPHY N/A 07/02/2017   Procedure: RIGHT/LEFT HEART CATH AND CORONARY ANGIOGRAPHY;  Surgeon: Martinique, Peter M, MD;  Location: [facility];  Service: Cardiovascular;  Laterality: N/A;  . WOUND DEBRIDEMENT  09/02/2008   Large sacral back open wound       HPI  from the history and physical done on the day of admission:    Patient coming from: Nursing home resident  Chief Complaint: Vomiting, abdominal pain  HPI: [name] is a 67 y.o. female with medical history significant for rheumatoid arthritis, GERD, HTN, paraplegia neurogenic bladder depression, NICM diastolic CHF, chronic hepatitis C. patient was brought to the ED from nursing home with reports of multiple episodes of vomiting.  She reports vomitus was initially black but later began to  clear.  She reports abdominal pain/discomfort and indigestion over the past week.  Reports she does not know her last bowel movement and she is paraplegic, but endorses nausea with reduced p.o. intake over the past week due to low appetite.  At the nursing home, vomitus tested positive for occult blood.  Patient denied actually seeing blood in vomitus.  ED Course: Intermittent tachycardia to 110, blood pressure systolic initially 80 improved to 117.  After 2 L bolus normal saline.  WBC elevated 22, creatinine 1.43 with elevated BUN- 50. UA-moderate leukocytes and few bacteria.  CT abdomen and pelvis with contrast-edge amount of stool in the rectosigmoid colon-obstipation and fecal impaction, mild right hydroureteronephrosis likely related to distal ureteral obstruction from rectosigmoid distention, change benign large rim calcified cystic mass in spleen.  He was started on broad-spectrum antibiotics with Vanco and Zosyn considering significant leukocytosis and UA suggestive of infection.  Patient was manually disimpacted in the ED, large amount of stool removed, also voided large amount of urine.     Hospital Course:    Brief Summary 67 y.o.femalewith medical history significantforrheumatoid arthritis, GERD, HTN,paraplegia neurogenic bladder depression,NICMdiastolic CHF,chronic hepatitis C. Admitted on 02/11/2018 with intractable emesis in the setting of fecal impaction/obstipation---- she has had over 24 bowel movements since admission with laxatives and enemas   Plan:- 1)Fecal Impaction/Obstipation--much improved with laxatives (lactulose) and enema,she has had over 24 bowel movements since admission with laxatives and enemas,  her bowel movements stools are brown,no evidence of blood.No further emesis, c  Suspect some component ofStercoralcolitis in the setting of fecal impaction and colon mucosa injury withtranslocation of bacteria ---- treated with Zosyn , discharged on  Augmentin for additional 5 days.,  WBC is down to 7.4 from 22, afebrile. patient with chronic constipation due to neurogenic bowel-----give MiraLAX 17 gm once every Monday Wednesday Fridays and Saturdays, give senna S----2 tablets twice a day and please give Dulcolax suppository every Tuesdays Thursdays and Saturdays and Sundays--- if no bowel movement in 48 hours please notify provider  2)Possible UTI--urine cultures without definite growth, patient has  neurogenic bladder  3)Intractable Emesis---most likely due to obstipation, no further emesis at this time, tolerating solid food well.   no further evidence ofGiblood loss  4)Recurrent right-sided pleural effusion----status post Prior Pleurx catheter placement, right-sided pleural effusion is resolved,  interventional radiology removed right-sided Pleurx catheter on 02/14/2018, imaging studies noted  5)Paraplegic with neurogenic bladder and neurogenic bowel--- continue home regimen  6)Healing Lt Big Toe Ulcer- healing "old" ulcer with good granulation tissue over the left big toe area, no clinical evidence of superimposed cellulitis or significant infection, no drainage no significant redness swelling warmth or tenderness, continue local wound care,  Code Status : full   Disposition Plan  : SNF  Consults  :  IR  removed right-sided Pleurx catheter  Discharge Condition: stable  Follow UP  Contact information for after-discharge care    Destination    [facility] .   Service:  Skilled Nursing Contact information: [place] [phone]              Diet and Activity recommendation:  As advised  Discharge Instructions    Discharge Instructions    Bed rest   Complete by:  As directed    Out of bed with equipment and with assist   Call MD for:  difficulty breathing, headache or visual disturbances   Complete by:  As directed    Call MD for:   persistant dizziness or light-headedness   Complete by:  As directed    Call MD for:  persistant nausea and vomiting   Complete by:  As directed    Call MD for:  severe uncontrolled pain   Complete by:  As directed    Call MD for:  temperature >100.4   Complete by:  As directed    Diet - low sodium heart healthy   Complete by:  As directed    Discharge instructions   Complete by:  As directed    1) patient with chronic constipation due to neurogenic bowel-----give MiraLAX 17 gm once every Monday Wednesday Fridays and Saturdays, give senna S----2 tablets twice a day and please give Dulcolax suppository every Tuesdays Thursdays and Saturdays and Sundays--- if no bowel movement in 48 hours please notify provider  2) repeat CBC and BMP within 1 week  3) low-salt diet advised  4) continue local wound care to left foot big toe wound  5) leave right chest wall dressing on for at least 24 hours, keep previous right Pleurx catheter site clean dry and intact for at least 24 hours        Discharge Medications     Allergies as of 02/14/2018   No Known Allergies     Medication List    STOP taking these medications   promethazine 25 MG tablet Commonly known as:  PHENERGAN     TAKE these medications   acetaminophen 325 MG tablet Commonly known as:  TYLENOL Take 650 mg by mouth every 6 (six) hours as needed.   amoxicillin-clavulanate 875-125 MG tablet Commonly known as:  AUGMENTIN Take 1 tablet by mouth 2 (two) times daily for 5 days.   baclofen 10 MG tablet Commonly known as:  LIORESAL Take 10 mg by mouth 3 (three) times daily.   bisacodyl 10 MG suppository Commonly known as:  DULCOLAX Place 1 suppository (10 mg total) rectally every Tuesday, Thursday, Saturday, and Sunday at 6 PM. Start taking on:  02/15/2018 What changed:  when to take this   carvedilol 6.25 MG tablet Commonly known as:  COREG Take 1 tablet (6.25 mg total) by mouth 2 (two) times daily with a meal. What  changed:  Another medication with the same name was removed. Continue taking this medication, and follow the directions you see here.   cholecalciferol 1000 units tablet Commonly known as:  VITAMIN D Take 1,000 Units by mouth daily.   Cranberry 450 MG Tabs Take 450 mg by mouth daily.   diclofenac sodium 1 % Gel Commonly known as:  VOLTAREN Apply 1 application topically 3 (three) times daily. To shoulders   diphenhydrAMINE 25 MG tablet  Commonly known as:  BENADRYL Take 25 mg by mouth every 8 (eight) hours. In addition pt. May take 38m by mouth every 6 hors as needed for rash   furosemide 40 MG tablet Commonly known as:  LASIX Take 40 mg by mouth 2 (two) times daily.   hydrocortisone 2.5 % cream Apply 1 application topically every 12 (twelve) hours. To arms and legs   hydroxychloroquine 200 MG tablet Commonly known as:  PLAQUENIL Take 200 mg by mouth daily.   Ledipasvir-Sofosbuvir 90-400 MG Tabs Commonly known as:  HARVONI Take 1 tablet by mouth daily. What changed:  when to take this   linaclotide 290 MCG Caps capsule Commonly known as:  LINZESS Take 290 mcg by mouth daily before breakfast.   losartan 25 MG tablet Commonly known as:  COZAAR Take 25 mg by mouth daily.   methocarbamol 750 MG tablet Commonly known as:  ROBAXIN Take 750 mg by mouth 2 (two) times daily.   metoCLOPramide 5 MG tablet Commonly known as:  REGLAN Take 5 mg by mouth 3 (three) times daily.   mirtazapine 7.5 MG tablet Commonly known as:  REMERON Take 7.5 mg by mouth at bedtime.   multivitamin with minerals Tabs tablet Take 1 tablet by mouth daily.   MYLANTA 200-200-20 MG/5ML suspension Generic drug:  alum & mag hydroxide-simeth Take 30 mLs by mouth every 6 (six) hours as needed (nausea).   omega-3 fish oil 1000 MG Caps capsule Commonly known as:  MAXEPA Take 1 capsule by mouth daily.   ondansetron 4 MG tablet Commonly known as:  ZOFRAN Take 4 mg by mouth every 4 (four) hours as  needed for nausea or vomiting.   oxybutynin 5 MG tablet Commonly known as:  DITROPAN Take 5 mg by mouth 2 (two) times daily.   OYSTER CALCIUM 500 + D 500-200 MG-UNIT tablet Generic drug:  calcium-vitamin D Take 1 tablet by mouth 2 (two) times daily.   pantoprazole 40 MG tablet Commonly known as:  PROTONIX Take 40 mg by mouth daily.   polyethylene glycol packet Commonly known as:  MIRALAX / GLYCOLAX Take 17 g by mouth every Monday, Wednesday, Friday, and Saturday at 6 PM.   PROMOD Liqd Take 30 mLs by mouth daily.   ribavirin 200 MG tablet Commonly known as:  COPEGUS Take 3 tablets (600 mg total) by mouth daily. What changed:    how much to take  when to take this   sennosides-docusate sodium 8.6-50 MG tablet Commonly known as:  SENOKOT-S Take 2 tablets by mouth 2 (two) times daily. What changed:  when to take this   spironolactone 100 MG tablet Commonly known as:  ALDACTONE Take 1 tablet (100 mg total) by mouth daily. For edema What changed:    how much to take  Another medication with the same name was removed. Continue taking this medication, and follow the directions you see here.   traMADol 50 MG tablet Commonly known as:  ULTRAM Take 1 tablet (50 mg total) by mouth every 6 (six) hours as needed. What changed:    when to take this  reasons to take this   ZYRTEC ALLERGY 10 MG tablet Generic drug:  cetirizine Take 10 mg by mouth daily.       Major procedures and Radiology Reports - PLEASE review detailed and final reports for all details, in brief -   Dg Chest 2 View  Result Date: 02/14/2018 CLINICAL DATA:  Shortness of breath EXAM: CHEST - 2 VIEW COMPARISON:  08/12/2017 FINDINGS: Right PleurX catheter in place. No pneumothorax. Right basilar atelectasis or scarring. No visible significant effusions. Heart is upper limits normal in size. No acute bony abnormality. Severe compression fracture in the midthoracic spine is stable since prior CT.  IMPRESSION: No visible right effusion or pneumothorax with right PleurX catheter remain in place. Right base atelectasis or scarring. Electronically Signed   By: Rolm Baptise M.D.   On: 02/14/2018 13:02   Ct Abdomen Pelvis W Contrast  Result Date: 02/10/2018 CLINICAL DATA:  Hematemesis. EXAM: CT ABDOMEN AND PELVIS WITH CONTRAST TECHNIQUE: Multidetector CT imaging of the abdomen and pelvis was performed using the standard protocol following bolus administration of intravenous contrast. CONTRAST:  36m OMNIPAQUE IOHEXOL 300 MG/ML  SOLN COMPARISON:  CT abdomen pelvis dated August 09, 2017. FINDINGS: Lower chest: No acute abnormality. Hypodensity in the region of the distal right pulmonary artery on the very first axial image likely represents hilar soft tissue, similar to prior CT chest from 06/28/2017 (series 2, image 59). Right-sided PleurX catheter in place. Right lower lobe subsegmental atelectasis. Moderate hiatal hernia, unchanged. Hepatobiliary: Nodular liver surface, consistent with cirrhosis. No focal liver abnormality. Status post cholecystectomy. No biliary dilatation. Pancreas: Unremarkable. No pancreatic ductal dilatation or surrounding inflammatory changes. Spleen: Unchanged rim calcified cystic lesion in the lower pole of the spleen measuring up to 8.9 cm. Adrenals/Urinary Tract: The adrenal glands are unremarkable. Subcentimeter low-density lesions in both kidneys remain too small to characterize, but are unchanged. New mild right hydroureteronephrosis. No renal or ureteral calculi. Moderately distended bladder. Stomach/Bowel: Large amount of stool in the rectosigmoid colon. No bowel wall thickening, distention, or surrounding inflammatory changes. Vascular/Lymphatic: Aortic atherosclerosis. No enlarged abdominal or pelvic lymph nodes. Reproductive: Calcified uterine fibroids are unchanged. No adnexal mass. Other: No free fluid or pneumoperitoneum. Musculoskeletal: No acute osseous findings.  IMPRESSION: 1. Large amount of stool in the rectosigmoid colon. Correlate for constipation and fecal impaction. 2. New mild right hydroureteronephrosis, likely related to distal ureteral obstruction from rectosigmoid distention. 3. Cirrhosis. 4. 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 5. Unchanged moderate hiatal hernia. 6.  Aortic atherosclerosis (ICD10-I70.0). Electronically Signed   By: WTitus DubinM.D.   On: 02/10/2018 18:50   Ir Removal Of Plural Cath W/cuff  Result Date: 02/14/2018 CLINICAL DATA:  History of breast cancer and recurrent right pleural effusion. Tunneled PleurX catheter placed in January 2019 at outside facility. Request removal of this catheter as it is no longer needed. EXAM: REMOVAL OF TUNNELED RIGHT SIDED PLEURAL DRAINAGE CATHETER COMPARISON:  None. MEDICATIONS: None; Antibiotic was administered in an appropriate time interval for the procedure. ANESTHESIA/SEDATION: No sedation provided. Moderate Sedation Time: 0 minutes. The patient's level of consciousness and vital signs were monitored continuously by radiology nursing throughout the procedure under my direct supervision. FLUOROSCOPY TIME:  FLUOROSCOPY TIME None COMPLICATIONS: None immediate. PROCEDURE: The procedure, risks, benefits, and alternatives were explained to the patient, who wish to proceed with the removal of this permanent pleural catheter as it is no longer needed the patient understand and consent to the procedure. The right lateral chest and upper abdomen were prepped with Chlorhexidine in a sterile fashion, and a sterile drape was applied covering the operative field. A sterile gown and sterile gloves were used for the procedure. 1% plain lidocaine was infiltrated along the tract of the catheter around the cuff. Using a combination of blunt dissection and traction, the cuff of the catheter was exposed followed by removal of the catheter  in its entirety. Pressure was applied to obtain  adequate hemostasis. Sterile dressing was applied. Patient tolerated the procedure well. FINDINGS: Removal of tunneled right pleural catheter. IMPRESSION: Successful removal of tunneled right pleural drainage catheter via lateral approach. No complications. Read by: Ascencion Dike PA-C Electronically Signed   By: Markus Daft M.D.   On: 02/14/2018 16:43    Micro Results    Recent Results (from the past 240 hour(s))  Urine Culture     Status: Abnormal   Collection Time: 02/10/18  7:39 PM  Result Value Ref Range Status   Specimen Description URINE, CATHETERIZED  Final   Special Requests   Final    NONE Performed at [facility], [address], [place], [place] [address]    Culture MULTIPLE SPECIES PRESENT, SUGGEST RECOLLECTION (A)  Final   Report Status 02/12/2018 FINAL  Final  Blood Culture (routine x 2)     Status: None (Preliminary result)   Collection Time: 02/10/18 10:35 PM  Result Value Ref Range Status   Specimen Description BLOOD RIGHT ANTECUBITAL  Final   Special Requests   Final    BOTTLES DRAWN AEROBIC AND ANAEROBIC Blood Culture adequate volume   Culture   Final    NO GROWTH 3 DAYS Performed at [facility], [place] [address], [place], [place] [address]    Report Status PENDING  Incomplete  Blood Culture (routine x 2)     Status: None (Preliminary result)   Collection Time: 02/10/18 10:40 PM  Result Value Ref Range Status   Specimen Description BLOOD RIGHT HAND  Final   Special Requests   Final    BOTTLES DRAWN AEROBIC AND ANAEROBIC Blood Culture adequate volume   Culture   Final    NO GROWTH 3 DAYS Performed at [facility], [place] [address], [place], [place] [address]    Report Status PENDING  Incomplete  MRSA PCR Screening     Status: Abnormal   Collection Time: 02/11/18  4:58 AM  Result Value Ref Range Status   MRSA by PCR POSITIVE (A) NEGATIVE Final    Comment:        The GeneXpert MRSA Assay (FDA approved for NASAL specimens only), is one  component of a comprehensive MRSA colonization surveillance program. It is not intended to diagnose MRSA infection nor to guide or monitor treatment for MRSA infections. RESULT CALLED TO, READ BACK BY AND VERIFIED WITH: RN [place] [phone] [id number] MLM Performed at [facility], [place] [address] [place], [place] [address]        Today   Subjective    Audie Wieser today has  no fevers, no emesis,  No chest pain,  no further emesis, no abdominal pain, no fevers, patient continues to have bowel movements, she has had over 24 bowel movements since admission with laxatives and enemas         Patient has been seen and examined prior to discharge   Objective   Blood pressure 120/76, pulse 75, temperature 98.5 F (36.9 C), temperature source Oral, resp. rate 16, height 5' (1.524 m), weight 68.1 kg (150 lb 2.1 oz), SpO2 100 %.   Intake/Output Summary (Last 24 hours) at 02/14/2018 1713 Last data filed at 02/14/2018 1315 Gross per 24 hour  Intake 720 ml  Output 0 ml  Net 720 ml    Exam Gen:- Awake Alert,  In no apparent distress  HEENT:- [place].AT, No sclera icterus Neck-Supple Neck,No JVD,.  Lungs-  CTAB , right-sided Pleurx catheter removed by interventional radiology on 02/14/2018 CV- S1, S2 normal Abd-  +ve B.Sounds, Abd Soft, No tenderness,    Extremity/Skin:-   warm and dry,,healing "old" ulcer with good granulation tissue over the left big toe area, no clinical evidence of superimposed cellulitis or significant infection, no drainage no significant redness swelling warmth or tenderness Psych-affect is appropriate, oriented x3 Neuro-paraplegia, not new, no tremors     Data Review   CBC w Diff:  Lab Results  Component Value Date   WBC 7.4 02/14/2018   HGB 9.2 (L) 02/14/2018   HCT 30.8 (L) 02/14/2018   PLT 200 02/14/2018   LYMPHOPCT 6.5 (L) 12/16/2017   MONOPCT 2.9 (L) 12/16/2017   EOSPCT 0.2 12/16/2017   BASOPCT 0.6 12/16/2017    CMP:  Lab Results  Component  Value Date   NA 137 02/14/2018   NA 146 12/10/2016   K 3.9 02/14/2018   CL 108 02/14/2018   CO2 20 (L) 02/14/2018   BUN 6 (L) 02/14/2018   BUN 10 12/10/2016   CREATININE 0.74 02/14/2018   CREATININE 1.06 (H) 02/08/2018   GLU 87 12/10/2016   PROT 10.3 (H) 02/10/2018   ALBUMIN 2.8 (L) 02/10/2018   BILITOT 0.7 02/10/2018   ALKPHOS 96 02/10/2018   AST 18 02/10/2018   ALT 21 02/10/2018  .   Total Discharge time is about 33 minutes  Roxan Hockey M.D on 02/14/2018 at 5:13 PM   Go to [email or web address] - password [email or web address] for contact info  [facility] - Office  [phone]

## 2018-02-14 NOTE — Progress Notes (Signed)
Pt transferred to IR for drain removal

## 2018-02-14 NOTE — Progress Notes (Signed)
RN applied tegaderm dressing to skin tear on pt's hips. RN changed dressing on pt's toes per MD order.

## 2018-02-14 NOTE — Progress Notes (Signed)
MD called to inform, pleural tube is indwelling and IR will remove Pt aware and understanding

## 2018-02-14 NOTE — Consult Note (Addendum)
[facility] Nurse wound consult note Reason for Consult: Consult requested for bilat feet.  Pt has intact, lighter-colored scar tissue to ischium, buttocks and middle back from previous wounds which have healed. Wound type: Left great toe with full thickness wound; .5X.5X.1cm, pink and dry, no odor, drainage, or fluctuance.  Small amt tan drainage, bone palpable. Right 3rd toe with full thickness wound; 3X.3X.1cm, pink and dry, no odor, drainage, or fluctuance.  Small amt tan drainage, bone palpable. Dressing procedure/placement/frequency:  Bactroban to provide antimicrobial benefits and promote moist healing.  Pt is wearing Prevalon boots to reduce pressure. Pt states she was going to have an MRI performed to left great toe prior to admission and this has not occurred yet. Please order this procedure if you would like it to be done while she is here. Please re-consult if further assistance is needed.  Thank-you,  Julien Girt MSN, [place], [place], [place], [place]

## 2018-02-14 NOTE — Discharge Instructions (Signed)
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

## 2018-02-14 NOTE — Progress Notes (Signed)
Pt IVs removed. Report given to [facility] transport. [facility] transport has all paperwork including AVS and printed prescriptions.Pt discharged via stretcher. Pt sister has all belongings

## 2018-02-16 LAB — CULTURE, BLOOD (ROUTINE X 2)
CULTURE: NO GROWTH
CULTURE: NO GROWTH
SPECIAL REQUESTS: ADEQUATE
Special Requests: ADEQUATE

## 2018-02-21 ENCOUNTER — Telehealth: Payer: Self-pay | Admitting: Pharmacist

## 2018-02-21 NOTE — Telephone Encounter (Signed)
Received a call from a nurse practitioner taking care of patient. She wanted to update [place] regarding patient's need for a PPI. She stated that patient just got out of hospital and has bad esophagitis and is throwing up due to this.  She put her on Protonix 40 mg daily and is separating and is needing advice.  I told her that we were aware patient is having a difficult time and needs a PPI.  I suggested that she do Protonix 40 or Omeprazole 20 mg daily at the same time as Harvoni under fasting conditions and we will hope for the best.  She stated patient may need more PPI but she will try that first and see what happens.  Patient comes in end of August for repeat labs.

## 2018-02-21 NOTE — Telephone Encounter (Signed)
Ok, just keep going the best we can. thanks

## 2018-02-22 ENCOUNTER — Encounter: Payer: Self-pay | Admitting: Pulmonary Disease

## 2018-02-22 ENCOUNTER — Ambulatory Visit (INDEPENDENT_AMBULATORY_CARE_PROVIDER_SITE_OTHER): Payer: Medicare Other | Admitting: Pulmonary Disease

## 2018-02-22 DIAGNOSIS — J90 Pleural effusion, not elsewhere classified: Secondary | ICD-10-CM | POA: Diagnosis not present

## 2018-02-22 DIAGNOSIS — B182 Chronic viral hepatitis C: Secondary | ICD-10-CM

## 2018-02-22 DIAGNOSIS — K5901 Slow transit constipation: Secondary | ICD-10-CM

## 2018-02-22 NOTE — Assessment & Plan Note (Addendum)
Follow-up in our office in 6 weeks with chest xray

## 2018-02-22 NOTE — Addendum Note (Signed)
Addended by: Vivia Ewing on: 02/22/2018 04:57 PM   Modules accepted: Orders

## 2018-02-22 NOTE — Progress Notes (Signed)
Reviewed and agree with assessment/plan.   Oryan Winterton, MD [facility] 07/22/2016, 12:24 PM Pager:  [phone]  

## 2018-02-22 NOTE — Assessment & Plan Note (Signed)
Continue follow-up with infectious disease

## 2018-02-22 NOTE — Progress Notes (Signed)
@Patient  ID: [name], female    DOB: Jun 21, 1951, 67 y.o.   MRN: [id number]  Chief Complaint  Patient presents with  . Acute Visit    N/v and continued constipation     Referring provider: Salome Arnt, PA  HPI: 67 y.o. female with pleural effusion.  Catheter removed at last hospitalization. Pt of Dr. Halford Chessman.   Tests:   Pulmonary tests: CT chest 06/28/17 >> large Rt pleural effusion Rt thoracentesis 06/29/17 >> glucose 94, LDH 70, protein 3.4, WBC 449 Rt thoracentesis 08/07/17 >> LDH 122, protein 3.4 CT chest 10/28/17 >> Rt base ATX, centrilobular and paraseptal emphysema, 3 mm nodule LUL, changes of cirrhosis  Cardiac tests: Echo 06/29/17 >> EF 20 to 25%, grade 2 DD, mod MR, PAS 51 mmHg    02/22/18  OV 67 year old patient seen for follow-up visit today.  Patient was recently hospitalized due to constipation.  Patient reports this is been ongoing issue with significant facility as patient reports she has not been getting her MiraLAX, senna, Dulcolax it was recommended post hospital.  Patient reports she received 1 Fleet enema last week.  Patient has had 2 bowel movements since then.  Patient reports she does not feel like she had a bowel movement last 5 days.  Patient is also had increased nausea and has thrown up about 20 times since last Thursday (02/17/2018).  Patient has not taken any Zofran for this.  Patient reports that she feels that she had increased indigestion but has not been taking her acid reflux pill patient reports that facility is offered this to her.  Patient has had right Pleurx drain removed on 02/14/18.  Patient has been doing well with the site.  Patient reports no issues with the site.  Will assess today.   No Known Allergies   There is no immunization history on file for this patient.  >>> Pt to get prevnar13 at facility, pt receptive to getting vaccines  >>>Pt needs flu vaccine this season  Past Medical History:  Diagnosis Date  . Acute  systolic CHF (congestive heart failure) ([place]) 06/28/2017   EF was normal 2016, now 20% with grade 2 diastolic dysfunction, no significant CAD at cath  . Anemia 10/12/2008  . CKD (chronic kidney disease)   . Depression   . Diaphragmatic hernia without mention of obstruction or gangrene 08/29/2010  . Edema 05/20/2009  . Gastroparesis 10/05/2010  . GERD (gastroesophageal reflux disease) 02/02/2009  . Hx of colonic polyps   . Hypertension 10/12/2008  . Hypotension, unspecified 05/20/2009  . Immobility syndrome (paraplegic) 1977  . Iron deficiency anemia, unspecified 10/12/2008  . Leiomyoma of uterus   . Malignant neoplasm of breast (female), unspecified site 10/05/2010   2003, 2013   . Paraplegia ([employer])   . Pleural effusion   . Pneumonia   . Respiratory failure ([place])   . Rheumatoid arthritis ([name]) 01/17/2009    Tobacco History: Social History   Tobacco Use  Smoking Status Former Smoker  . Packs/day: 1.00  . Years: 10.00  . Pack years: 10.00  . Last attempt to quit: 07/28/1995  . Years since quitting: 22.5  Smokeless Tobacco Never Used   Counseling given: Not Answered Continue not smoking  Outpatient Encounter Medications as of 02/22/2018  Medication Sig  . acetaminophen (TYLENOL) 325 MG tablet Take 650 mg by mouth every 6 (six) hours as needed.  Marland Kitchen alum & mag hydroxide-simeth (MYLANTA) 623-762-83 MG/5ML suspension Take 30 mLs by mouth every 6 (six) hours as needed (  nausea).  . baclofen (LIORESAL) 10 MG tablet Take 10 mg by mouth 3 (three) times daily.   . bisacodyl (DULCOLAX) 10 MG suppository Place 1 suppository (10 mg total) rectally every Tuesday, Thursday, Saturday, and Sunday at 6 PM.  . calcium-vitamin D (OYSTER CALCIUM 500 + D) 500-200 MG-UNIT per tablet Take 1 tablet by mouth 2 (two) times daily.   . carvedilol (COREG) 6.25 MG tablet Take 1 tablet (6.25 mg total) by mouth 2 (two) times daily with a meal.  . cetirizine (ZYRTEC ALLERGY) 10 MG tablet Take 10 mg by mouth  daily.   . cholecalciferol (VITAMIN D) 1000 units tablet Take 1,000 Units by mouth daily.  . Cranberry 450 MG TABS Take 450 mg by mouth daily.  . diclofenac sodium (VOLTAREN) 1 % GEL Apply 1 application topically 3 (three) times daily. To shoulders  . diphenhydrAMINE (BENADRYL) 25 MG tablet Take 25 mg by mouth every 8 (eight) hours. In addition pt. May take 25mg  by mouth every 6 hors as needed for rash  . furosemide (LASIX) 40 MG tablet Take 40 mg by mouth 2 (two) times daily.   . hydrocortisone 2.5 % cream Apply 1 application topically every 12 (twelve) hours. To arms and legs  . hydroxychloroquine (PLAQUENIL) 200 MG tablet Take 200 mg by mouth daily.   . Ledipasvir-Sofosbuvir (HARVONI) 90-400 MG TABS Take 1 tablet by mouth daily. (Patient taking differently: Take 1 tablet by mouth every evening. )  . linaclotide (LINZESS) 290 MCG CAPS capsule Take 290 mcg by mouth daily before breakfast.  . losartan (COZAAR) 25 MG tablet Take 25 mg by mouth daily.  . methocarbamol (ROBAXIN) 750 MG tablet Take 750 mg by mouth 2 (two) times daily.   . metoCLOPramide (REGLAN) 5 MG tablet Take 5 mg by mouth 3 (three) times daily.  . mirtazapine (REMERON) 7.5 MG tablet Take 7.5 mg by mouth at bedtime.   . Multiple Vitamin (MULTIVITAMIN WITH MINERALS) TABS tablet Take 1 tablet by mouth daily.  . Nutritional Supplements (PROMOD) LIQD Take 30 mLs by mouth daily.  Marland Kitchen omega-3 fish oil (MAXEPA) 1000 MG CAPS capsule Take 1 capsule by mouth daily.   . ondansetron (ZOFRAN) 4 MG tablet Take 4 mg by mouth every 4 (four) hours as needed for nausea or vomiting.  Marland Kitchen oxybutynin (DITROPAN) 5 MG tablet Take 5 mg by mouth 2 (two) times daily.  . pantoprazole (PROTONIX) 40 MG tablet Take 40 mg by mouth daily.  . polyethylene glycol (MIRALAX / GLYCOLAX) packet Take 17 g by mouth every Monday, Wednesday, Friday, and Saturday at 6 PM.  . ribavirin (COPEGUS) 200 MG tablet Take 3 tablets (600 mg total) by mouth daily. (Patient taking  differently: Take 600 mg/day by mouth every evening. )  . sennosides-docusate sodium (SENOKOT-S) 8.6-50 MG tablet Take 2 tablets by mouth 2 (two) times daily.  Marland Kitchen spironolactone (ALDACTONE) 100 MG tablet Take 1 tablet (100 mg total) by mouth daily. For edema  . traMADol (ULTRAM) 50 MG tablet Take 1 tablet (50 mg total) by mouth every 6 (six) hours as needed.   No facility-administered encounter medications on file as of 02/22/2018.      Review of Systems  Review of Systems  Constitutional: Negative for chills, fatigue, fever and unexpected weight change.  HENT: Negative for congestion, ear pain, postnasal drip, rhinorrhea, sinus pressure, sinus pain, sneezing and sore throat.   Respiratory: Negative for cough, chest tightness, shortness of breath and wheezing.   Cardiovascular: Negative for chest pain  and palpitations.  Gastrointestinal: Positive for abdominal distention, constipation, nausea and vomiting. Negative for blood in stool and diarrhea.       Epigastic pain  Heartburn   Genitourinary: Negative for dysuria, frequency and urgency.  Musculoskeletal: Negative for arthralgias.  Skin: Negative for color change.  Allergic/Immunologic: Negative for environmental allergies and food allergies.  Neurological: Positive for weakness. Negative for dizziness, light-headedness and headaches.  Psychiatric/Behavioral: Negative for dysphoric mood. The patient is not nervous/anxious.       Physical Exam  BP 122/88   Pulse 99   SpO2 99%   Wt Readings from Last 5 Encounters:  02/14/18 150 lb 2.1 oz (68.1 kg)  11/03/17 156 lb (70.8 kg)  10/26/17 156 lb (70.8 kg)  08/25/17 156 lb (70.8 kg)  07/16/17 156 lb (70.8 kg)     Physical Exam  Constitutional: She is oriented to person, place, and time and well-developed, well-nourished, and in no distress. No distress.  HENT:  Head: Normocephalic and atraumatic.  Right Ear: Hearing, tympanic membrane, external ear and ear canal normal.  Left  Ear: Hearing, tympanic membrane, external ear and ear canal normal.  Nose: Nose normal. Right sinus exhibits no maxillary sinus tenderness and no frontal sinus tenderness. Left sinus exhibits no maxillary sinus tenderness and no frontal sinus tenderness.  Mouth/Throat: Uvula is midline and oropharynx is clear and moist. No oropharyngeal exudate.  Eyes: Pupils are equal, round, and reactive to light.  Neck: Normal range of motion. Neck supple. No JVD present.  Cardiovascular: Normal rate, regular rhythm and normal heart sounds.  Pulmonary/Chest: Effort normal and breath sounds normal. No accessory muscle usage. No respiratory distress. She has no decreased breath sounds. She has no wheezes. She has no rhonchi.  Air movement in all lobes  Abdominal: Soft. Bowel sounds are normal. She exhibits no distension and no mass. There is no tenderness. There is no guarding.  Bowel sounds positive in all quadrants.  Lymphadenopathy:    She has no cervical adenopathy.  Neurological: She is alert and oriented to person, place, and time.  Skin: Skin is warm and dry. She is not diaphoretic. No erythema.     Psychiatric: Mood, memory, affect and judgment normal.  Nursing note and vitals reviewed.     Lab Results:  CBC    Component Value Date/Time   WBC 7.4 02/14/2018 0705   RBC 3.94 02/14/2018 0705   HGB 9.2 (L) 02/14/2018 0705   HCT 30.8 (L) 02/14/2018 0705   PLT 200 02/14/2018 0705   MCV 78.2 02/14/2018 0705   MCH 23.4 (L) 02/14/2018 0705   MCHC 29.9 (L) 02/14/2018 0705   RDW 19.1 (H) 02/14/2018 0705   LYMPHSABS 0.6 (L) 12/16/2017 1555   MONOABS 0.3 12/16/2017 1555   EOSABS 0.0 12/16/2017 1555   BASOSABS 0.0 12/16/2017 1555    BMET    Component Value Date/Time   NA 137 02/14/2018 0705   NA 146 12/10/2016   K 3.9 02/14/2018 0705   CL 108 02/14/2018 0705   CO2 20 (L) 02/14/2018 0705   GLUCOSE 70 02/14/2018 0705   BUN 6 (L) 02/14/2018 0705   BUN 10 12/10/2016   CREATININE 0.74  02/14/2018 0705   CREATININE 1.06 (H) 02/08/2018 1621   CALCIUM 8.4 (L) 02/14/2018 0705   GFRNONAA >60 02/14/2018 0705   GFRNONAA 54 (L) 02/08/2018 1621   GFRAA >60 02/14/2018 0705   GFRAA 63 02/08/2018 1621    BNP    Component Value Date/Time   BNP  296.5 (H) 07/01/2017 0513    ProBNP    Component Value Date/Time   PROBNP 82.0 09/01/2008 2158    Imaging: Dg Chest 2 View  Result Date: 02/14/2018 CLINICAL DATA:  Shortness of breath EXAM: CHEST - 2 VIEW COMPARISON:  08/12/2017 FINDINGS: Right PleurX catheter in place. No pneumothorax. Right basilar atelectasis or scarring. No visible significant effusions. Heart is upper limits normal in size. No acute bony abnormality. Severe compression fracture in the midthoracic spine is stable since prior CT. IMPRESSION: No visible right effusion or pneumothorax with right PleurX catheter remain in place. Right base atelectasis or scarring. Electronically Signed   By: Rolm Baptise M.D.   On: 02/14/2018 13:02   Ct Abdomen Pelvis W Contrast  Result Date: 02/10/2018 CLINICAL DATA:  Hematemesis. EXAM: CT ABDOMEN AND PELVIS WITH CONTRAST TECHNIQUE: Multidetector CT imaging of the abdomen and pelvis was performed using the standard protocol following bolus administration of intravenous contrast. CONTRAST:  64mL OMNIPAQUE IOHEXOL 300 MG/ML  SOLN COMPARISON:  CT abdomen pelvis dated August 09, 2017. FINDINGS: Lower chest: No acute abnormality. Hypodensity in the region of the distal right pulmonary artery on the very first axial image likely represents hilar soft tissue, similar to prior CT chest from 06/28/2017 (series 2, image 59). Right-sided PleurX catheter in place. Right lower lobe subsegmental atelectasis. Moderate hiatal hernia, unchanged. Hepatobiliary: Nodular liver surface, consistent with cirrhosis. No focal liver abnormality. Status post cholecystectomy. No biliary dilatation. Pancreas: Unremarkable. No pancreatic ductal dilatation or surrounding  inflammatory changes. Spleen: Unchanged rim calcified cystic lesion in the lower pole of the spleen measuring up to 8.9 cm. Adrenals/Urinary Tract: The adrenal glands are unremarkable. Subcentimeter low-density lesions in both kidneys remain too small to characterize, but are unchanged. New mild right hydroureteronephrosis. No renal or ureteral calculi. Moderately distended bladder. Stomach/Bowel: Large amount of stool in the rectosigmoid colon. No bowel wall thickening, distention, or surrounding inflammatory changes. Vascular/Lymphatic: Aortic atherosclerosis. No enlarged abdominal or pelvic lymph nodes. Reproductive: Calcified uterine fibroids are unchanged. No adnexal mass. Other: No free fluid or pneumoperitoneum. Musculoskeletal: No acute osseous findings. IMPRESSION: 1. Large amount of stool in the rectosigmoid colon. Correlate for constipation and fecal impaction. 2. New mild right hydroureteronephrosis, likely related to distal ureteral obstruction from rectosigmoid distention. 3. Cirrhosis. 4. 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 5. Unchanged moderate hiatal hernia. 6.  Aortic atherosclerosis (ICD10-I70.0). Electronically Signed   By: Titus Dubin M.D.   On: 02/10/2018 18:50   Ir Removal Of Plural Cath W/cuff  Result Date: 02/14/2018 CLINICAL DATA:  History of breast cancer and recurrent right pleural effusion. Tunneled PleurX catheter placed in January 2019 at outside facility. Request removal of this catheter as it is no longer needed. EXAM: REMOVAL OF TUNNELED RIGHT SIDED PLEURAL DRAINAGE CATHETER COMPARISON:  None. MEDICATIONS: None; Antibiotic was administered in an appropriate time interval for the procedure. ANESTHESIA/SEDATION: No sedation provided. Moderate Sedation Time: 0 minutes. The patient's level of consciousness and vital signs were monitored continuously by radiology nursing throughout the procedure under my direct supervision. FLUOROSCOPY TIME:   FLUOROSCOPY TIME None COMPLICATIONS: None immediate. PROCEDURE: The procedure, risks, benefits, and alternatives were explained to the patient, who wish to proceed with the removal of this permanent pleural catheter as it is no longer needed the patient understand and consent to the procedure. The right lateral chest and upper abdomen were prepped with Chlorhexidine in a sterile fashion, and a sterile drape was applied covering the operative field. A  sterile gown and sterile gloves were used for the procedure. 1% plain lidocaine was infiltrated along the tract of the catheter around the cuff. Using a combination of blunt dissection and traction, the cuff of the catheter was exposed followed by removal of the catheter in its entirety. Pressure was applied to obtain adequate hemostasis. Sterile dressing was applied. Patient tolerated the procedure well. FINDINGS: Removal of tunneled right pleural catheter. IMPRESSION: Successful removal of tunneled right pleural drainage catheter via lateral approach. No complications. Read by: Ascencion Dike PA-C Electronically Signed   By: Markus Daft M.D.   On: 02/14/2018 16:43     Assessment & Plan:   Pleasant 67 year old patient seen office visit today.  We will have patient follow-up in 6 to 8 weeks with chest x-ray at that appointment.  Encouraged patient notify our office if she is having worsening respiratory symptoms or trouble breathing.  Encouraged patient to follow-up with facility about constipation, concern regarding bowel regimen.  Encouraged patient to receive pneumonia vaccine from facility.  And to receive flu vaccine when they become available.  Patient believes she is completed lab work with facility.  We will call to obtain these records.  We will hold off on lab work today.  Pleural effusion Follow-up in our office in 6 weeks with chest xray    Constipation Encourage you to discuss with your facility your concerns regarding bowel regimen.     Discussed with your facility restarting MiraLAX, senna, dulcolax      Chronic hepatitis C without hepatic coma ([place]) Continue follow-up with infectious disease     Lauraine Rinne, NP 02/22/2018

## 2018-02-22 NOTE — Assessment & Plan Note (Addendum)
Encourage you to discuss with your facility your concerns regarding bowel regimen.    Discussed with your facility restarting MiraLAX, senna, dulcolax

## 2018-02-22 NOTE — Patient Instructions (Addendum)
Encourage you to discuss with your facility your concerns regarding bowel regimen.    Discussed with your facility restarting MiraLAX, senna, dulcolax   Encourage you to discuss with the facility getting a pneumonia vaccine   Encourage you to get the flu vaccine when it becomes available in September at your facility  Follow-up in our office in 6-8 weeks >>>With chest xray    Please contact the office if your symptoms worsen or you have concerns that you are not improving.   Thank you for choosing [facility] for your healthcare, and for allowing [place] to partner with you on your healthcare journey. I am thankful to be able to provide care to you today.   Wyn Quaker FNP-C

## 2018-03-21 ENCOUNTER — Ambulatory Visit (INDEPENDENT_AMBULATORY_CARE_PROVIDER_SITE_OTHER): Payer: Medicare Other | Admitting: Pharmacist

## 2018-03-21 DIAGNOSIS — B182 Chronic viral hepatitis C: Secondary | ICD-10-CM

## 2018-03-21 NOTE — Progress Notes (Signed)
HPI: [name] is a 67 y.o. female who presents to the [facility] to follow-up for her Hep C infection.  Medication: Harvoni + low-dose ribavirin x 12 weeks  Start Date: 01/13/18  Hepatitis C Genotype: 1a  Fibrosis Score: F4 with CP class B (decompated cirrhosis)  Hepatitis C RNA: 522,000 on 5/23 and <15 on 7/16  Patient Active Problem List   Diagnosis Date Noted  . Obstipation/fecal impaction 02/12/2018  . Intractable vomiting 02/11/2018  . Chronic hepatitis C without hepatic coma ([place]) 01/10/2018  . NICM (nonischemic cardiomyopathy) ([place]) 08/25/2017  . Normal coronary arteries 08/25/2017  . Diastolic dysfunction 48/54/6270  . S/P thoracentesis   . Status post thoracentesis   . S/p percutaneous right heart catheterization   . Acute systolic (congestive) heart failure ([place])   . Pleural effusion on right 06/29/2017  . Respiratory failure with hypoxia ([place]) 06/29/2017  . Pressure injury of skin 06/29/2017  . Acute on chronic respiratory failure with hypoxemia ([place]) 06/29/2017  . SOB (shortness of breath)   . Hypokalemia 09/21/2016  . HCAP (healthcare-associated pneumonia) 07/28/2016  . Fever in adult 07/28/2016  . Sepsis ([place]) 06/10/2016  . Decubitus ulcer of coccyx 02/04/2016  . Hyperglycemia 12/03/2015  . H/O bilateral mastectomy 07/25/2015  . Benign hypertensive heart disease without heart failure 07/15/2015  . Hiatal hernia 07/06/2015  . 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 Pleural effusion 07/06/2015  . History of breast cancer 08/21/2014  . Adynamic ileus ([place]) 07/22/2014  . Elevated liver enzymes 07/22/2014  . CHF (congestive heart failure) ([place]) 07/05/2014  . Gastroparesis 07/05/2014  . Microcytic anemia 01/05/2014  . Insomnia 07/14/2013  . Edema 05/01/2013  . Rheumatoid arthritis ([place]) 02/23/2013  . GERD (gastroesophageal reflux disease) 02/23/2013  . Essential hypertension 02/23/2013  . Neurogenic bladder 02/23/2013  .  Paraplegia ([place]) 02/23/2013  . Depression 02/23/2013  . Allergic rhinitis 02/23/2013  . Constipation 02/23/2013  . 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[place]) 09/24/2011  . Breast cancer, left breast ([place]) 09/08/2011    Patient's Medications  New Prescriptions   No medications on file  Previous Medications   ACETAMINOPHEN (TYLENOL) 325 MG TABLET    Take 650 mg by mouth every 6 (six) hours as needed.   ALUM & MAG HYDROXIDE-SIMETH (MYLANTA) 350-093-81 MG/5ML SUSPENSION    Take 30 mLs by mouth every 6 (six) hours as needed (nausea).   BACLOFEN (LIORESAL) 10 MG TABLET    Take 10 mg by mouth 3 (three) times daily.    BISACODYL (DULCOLAX) 10 MG SUPPOSITORY    Place 1 suppository (10 mg total) rectally every Tuesday, Thursday, Saturday, and Sunday at 6 PM.   CALCIUM-VITAMIN D (OYSTER CALCIUM 500 + D) 500-200 MG-UNIT PER TABLET    Take 1 tablet by mouth 2 (two) times daily.    CARVEDILOL (COREG) 6.25 MG TABLET    Take 1 tablet (6.25 mg total) by mouth 2 (two) times daily with a meal.   CETIRIZINE (ZYRTEC ALLERGY) 10 MG TABLET    Take 10 mg by mouth daily.    CHOLECALCIFEROL (VITAMIN D) 1000 UNITS TABLET    Take 1,000 Units by mouth daily.   CRANBERRY 450 MG TABS    Take 450 mg by mouth daily.   DICLOFENAC SODIUM (VOLTAREN) 1 % GEL    Apply 1 application topically 3 (three) times daily. To shoulders   DIPHENHYDRAMINE (BENADRYL) 25 MG TABLET    Take 25 mg by mouth every 8 (eight) hours.  In addition pt. May take 71m by mouth every 6 hors as needed for rash   FUROSEMIDE (LASIX) 40 MG TABLET    Take 40 mg by mouth 2 (two) times daily.    HYDROCORTISONE 2.5 % CREAM    Apply 1 application topically every 12 (twelve) hours. To arms and legs   HYDROXYCHLOROQUINE (PLAQUENIL) 200 MG TABLET    Take 200 mg by mouth daily.    LEDIPASVIR-SOFOSBUVIR (HARVONI) 90-400 MG TABS    Take 1 tablet by mouth daily.   LINACLOTIDE (LINZESS) 290 MCG CAPS CAPSULE    Take 290 mcg by mouth daily before breakfast.    LOSARTAN (COZAAR) 25 MG TABLET    Take 25 mg by mouth daily.   METHOCARBAMOL (ROBAXIN) 750 MG TABLET    Take 750 mg by mouth 2 (two) times daily.    METOCLOPRAMIDE (REGLAN) 5 MG TABLET    Take 5 mg by mouth 3 (three) times daily.   MIRTAZAPINE (REMERON) 7.5 MG TABLET    Take 7.5 mg by mouth at bedtime.    MULTIPLE VITAMIN (MULTIVITAMIN WITH MINERALS) TABS TABLET    Take 1 tablet by mouth daily.   NUTRITIONAL SUPPLEMENTS (PROMOD) LIQD    Take 30 mLs by mouth daily.   OMEGA-3 FISH OIL (MAXEPA) 1000 MG CAPS CAPSULE    Take 1 capsule by mouth daily.    ONDANSETRON (ZOFRAN) 4 MG TABLET    Take 4 mg by mouth every 4 (four) hours as needed for nausea or vomiting.   OXYBUTYNIN (DITROPAN) 5 MG TABLET    Take 5 mg by mouth 2 (two) times daily.   PANTOPRAZOLE (PROTONIX) 40 MG TABLET    Take 40 mg by mouth daily.   POLYETHYLENE GLYCOL (MIRALAX / GLYCOLAX) PACKET    Take 17 g by mouth every Monday, Wednesday, Friday, and Saturday at 6 PM.   RIBAVIRIN (COPEGUS) 200 MG TABLET    Take 3 tablets (600 mg total) by mouth daily.   SENNOSIDES-DOCUSATE SODIUM (SENOKOT-S) 8.6-50 MG TABLET    Take 2 tablets by mouth 2 (two) times daily.   SPIRONOLACTONE (ALDACTONE) 100 MG TABLET    Take 1 tablet (100 mg total) by mouth daily. For edema   TRAMADOL (ULTRAM) 50 MG TABLET    Take 1 tablet (50 mg total) by mouth every 6 (six) hours as needed.  Modified Medications   No medications on file  Discontinued Medications   No medications on file    Allergies: No Known Allergies  Past Medical History: Past Medical History:  Diagnosis Date  . Acute systolic CHF (congestive heart failure) ([place] 06/28/2017   EF was normal 2016, now 20% with grade 2 diastolic dysfunction, no significant CAD at cath  . Anemia 10/12/2008  . CKD (chronic kidney disease)   . Depression   . Diaphragmatic hernia without mention of obstruction or gangrene 08/29/2010  . Edema 05/20/2009  . Gastroparesis 10/05/2010  . GERD (gastroesophageal reflux  disease) 02/02/2009  . Hx of colonic polyps   . Hypertension 10/12/2008  . Hypotension, unspecified 05/20/2009  . Immobility syndrome (paraplegic) 1977  . Iron deficiency anemia, unspecified 10/12/2008  . Leiomyoma of uterus   . Malignant neoplasm of breast (female), unspecified site 10/05/2010   2003, 2013   . Paraplegia ([place]   . Pleural effusion   . Pneumonia   . Respiratory failure ([place]   . Rheumatoid arthritis ([place] 01/17/2009    Social History: Social History   Socioeconomic History  . Marital status:  Single    Spouse name: Not on file  . Number of children: Not on file  . Years of education: Not on file  . Highest education level: Not on file  Occupational History  . Not on file  Social Needs  . Financial resource strain: Not on file  . Food insecurity:    Worry: Not on file    Inability: Not on file  . Transportation needs:    Medical: Not on file    Non-medical: Not on file  Tobacco Use  . Smoking status: Former Smoker    Packs/day: 1.00    Years: 10.00    Pack years: 10.00    Last attempt to quit: 07/28/1995    Years since quitting: 22.6  . Smokeless tobacco: Never Used  Substance and Sexual Activity  . Alcohol use: No  . Drug use: No  . Sexual activity: Not on file  Lifestyle  . Physical activity:    Days per week: Not on file    Minutes per session: Not on file  . Stress: Not on file  Relationships  . Social connections:    Talks on phone: Not on file    Gets together: Not on file    Attends religious service: Not on file    Active member of club or organization: Not on file    Attends meetings of clubs or organizations: Not on file    Relationship status: Not on file  Other Topics Concern  . 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     Has no children.     Education: Law degree.    Labs: Hepatitis C Lab Results  Component Value Date   HEPCAB REACTIVE (A) 12/16/2017    HCVRNAPCRQN <15 NOT DETECTED 02/08/2018   HCVRNAPCRQN 522,000 (H) 12/16/2017   Hepatitis B Lab Results  Component Value Date   HEPBSAB NON-REACTIVE 12/16/2017   HEPBSAG NON-REACTIVE 12/16/2017   Hepatitis A Lab Results  Component Value Date   HAV NON-REACTIVE 12/16/2017   HIV Lab Results  Component Value Date   HIV Non Reactive 02/11/2018   Lab Results  Component Value Date   CREATININE 0.74 02/14/2018   CREATININE 1.34 (H) 02/12/2018   CREATININE 1.23 (H) 02/11/2018   CREATININE 1.43 (H) 02/10/2018   CREATININE 1.06 (H) 02/08/2018   Lab Results  Component Value Date   AST 18 02/10/2018   AST 13 02/08/2018   AST 23 12/16/2017   ALT 21 02/10/2018   ALT 16 02/08/2018   ALT 31 12/16/2017   INR 1.37 02/10/2018   INR 1.2 (H) 12/16/2017   INR 1.10 07/01/2017    Assessment: [name] is here today to follow-up for her Hep C infection.  She was started on Harvoni + low-dose ribavirin 600 mg daily on 6/20 for a total of 12 weeks due to her decompensated cirrhosis.  She saw [place] last month and was doing well on the medication with some associated nausea. She still resides at [facility].  There have been some issues since her last visit here.  She was hospitalized a few days after being seen here for severe constipation the required disimpaction. She had some blood in her vomit and she was also started on a PPI drip in the hospital.  We were aware of this and instructed the hospital to give the Harvoni and ribavirin to her anyway.  She tells me today that she did  not get any of her Hep C medication while she was in the hospital for 4-5 days. She was also on a liquid diet for quite some time (she is unaware exactly how many days) and was unable to take the medication when she was released as well.    She is not sure how many doses she has missed but I would assume a week or two total. She also realized they were only giving her 200 mg of ribavirin and not 600 mg for a few days.  We  have no clue how many days she has left total but she has not gotten her 3rd month yet and is a few weeks past that due date.  Dr. Linus Salmons is aware of these issues and we will hope for the best.  I told her the importance of compliance and how complicated this situation is. She is also taking a daily PPI as well now due to her severe acid reflux and stomach issues. Per her MAR that she brought with her, the nexium, Harvoni, and ribavirin are all given to her in the morning before food at the same time. Will check another Hep C viral load and CBC today.  Her hemoglobin was down some from when she was hospitalized but I assume it was due to the bleeding.  I will have her come back and see Dr. Linus Salmons in mid-October for when I think she will be done with therapy.  Plan: - Continue Harvoni PO once daily - Continue ribavirin 600 mg PO once daily - Hep C viral load and CBC today - F/u with Dr. Linus Salmons 10/16 at 245pm  Cassie L. Kuppelweiser, PharmD, [place], [facility] 03/21/2018, 4:28 PM

## 2018-03-21 NOTE — Patient Instructions (Addendum)
Thank you for coming to see me today!  Please make sure you are taking Harvoni with the ribavirin 600 mg EVERY DAY at the same time as your acid reflux medication. Please do not miss any more doses.   You have a follow-up appointment with Dr. Linus Salmons on Wednesday October 16th at 2:45pm. Please call [phone] if you need to reschedule.

## 2018-03-23 LAB — CBC
HEMATOCRIT: 36.9 % (ref 35.0–45.0)
Hemoglobin: 11.8 g/dL (ref 11.7–15.5)
MCH: 25.3 pg — ABNORMAL LOW (ref 27.0–33.0)
MCHC: 32 g/dL (ref 32.0–36.0)
MCV: 79 fL — AB (ref 80.0–100.0)
MPV: 9.8 fL (ref 7.5–12.5)
Platelets: 269 10*3/uL (ref 140–400)
RBC: 4.67 10*6/uL (ref 3.80–5.10)
RDW: 16.9 % — AB (ref 11.0–15.0)
WBC: 5.4 10*3/uL (ref 3.8–10.8)

## 2018-03-23 LAB — HEPATITIS C RNA QUANTITATIVE
HCV QUANT LOG: NOT DETECTED {Log_IU}/mL
HCV RNA, PCR, QN: NOT DETECTED [IU]/mL

## 2018-03-25 ENCOUNTER — Ambulatory Visit: Payer: Medicare Other | Admitting: Cardiology

## 2018-03-31 MED FILL — RIBAVIRIN 200 MG TABLET: 200 | 28 days supply | Qty: 84 | Fill #2

## 2018-04-02 NOTE — Progress Notes (Signed)
Cardiology Office Note   Date:  04/04/2018   ID:  [name], DOB 02-02-1951, MRN [id number]  PCP:  Salome Arnt, Will  Cardiologist:   Minus Breeding, MD   Chief Complaint  Patient presents with  . Congestive Heart Failure      History of Present Illness: [name] is a 67 y.o. female who presents for follow up of congestive heart failure.  She was in the hospital in Dec 2018.   In 2016 she had a normal EF.  However, during the recent hospitalization she had an EF of 20%. Cath demonstrated normal coronaries.  She had thoracentesis of 1.2 liters on the left.  She went back to the nursing home where she lives but required another thoracentesis.  This one was on the right.  She had a Pleurex catheter placed.  She was in [facility] for that event in January of this year.  Since I saw her in May she was again in the hospital.  I reviewed these records for this visit.   She was again in the hospital with obstipation and UTI.  Pleurex was removed.    Since going home from the hospital she is done well.  She is been breathing better.  She is off oxygen.  She is not describing new PND or orthopnea.  There is been no swelling.  Her weights have been somewhat inconsistent as she has the use of Hoyer lift to get weighed.  She is not describing any chest pressure, neck or arm discomfort.  Past Medical History:  Diagnosis Date  . Acute systolic CHF (congestive heart failure) ([place]) 06/28/2017   EF was normal 2016, now 20% with grade 2 diastolic dysfunction, no significant CAD at cath  . Anemia 10/12/2008  . CKD (chronic kidney disease)   . Depression   . Diaphragmatic hernia without mention of obstruction or gangrene 08/29/2010  . Edema 05/20/2009  . Gastroparesis 10/05/2010  . GERD (gastroesophageal reflux disease) 02/02/2009  . Hx of colonic polyps   . Hypertension 10/12/2008  . Hypotension, unspecified 05/20/2009  . Immobility syndrome (paraplegic) 1977  . Iron  deficiency anemia, unspecified 10/12/2008  . Leiomyoma of uterus   . Malignant neoplasm of breast (female), unspecified site 10/05/2010   2003, 2013   . Paraplegia ([place])   . Pleural effusion   . Pneumonia   . Respiratory failure ([place])   . Rheumatoid arthritis ([place]) 01/17/2009    Past Surgical History:  Procedure Laterality Date  . [place]  . BREAST SURGERY  2003   Bilateral mastectomy  . IR FLUORO GUIDE CV LINE RIGHT  06/23/2017  . IR REMOVAL OF PLURAL CATH W/CUFF  02/14/2018  . IR REMOVAL TUN CV CATH W/O FL  07/15/2017  . IR THORACENTESIS ASP PLEURAL SPACE W/IMG GUIDE  07/30/2017  . IR US GUIDE VASC ACCESS RIGHT  06/23/2017  . PRESSURE ULCER DEBRIDEMENT  2009   on back  . RIGHT/LEFT HEART CATH AND CORONARY ANGIOGRAPHY N/A 07/02/2017   Procedure: RIGHT/LEFT HEART CATH AND CORONARY ANGIOGRAPHY;  Surgeon: Martinique, Peter M, MD;  Location: [facility];  Service: Cardiovascular;  Laterality: N/A;  . WOUND DEBRIDEMENT  09/02/2008   Large sacral back open wound     Current Outpatient Medications  Medication Sig Dispense Refill  . acetaminophen (TYLENOL) 325 MG tablet Take 650 mg by mouth every 6 (six) hours as needed.    Marland Kitchen alum &  mag hydroxide-simeth (MYLANTA) 200-200-20 MG/5ML suspension Take 30 mLs by mouth every 6 (six) hours as needed (nausea).    . baclofen (LIORESAL) 10 MG tablet Take 10 mg by mouth 3 (three) times daily.     . bisacodyl (DULCOLAX) 10 MG suppository Place 1 suppository (10 mg total) rectally every Tuesday, Thursday, Saturday, and Sunday at 6 PM. 30 suppository 3  . calcium-vitamin D (OYSTER CALCIUM 500 + D) 500-200 MG-UNIT per tablet Take 1 tablet by mouth 2 (two) times daily.     . carvedilol (COREG) 6.25 MG tablet Take 1 tablet (6.25 mg total) by mouth 2 (two) times daily with a meal. 60 tablet 3  . cetirizine (ZYRTEC ALLERGY) 10 MG tablet Take 10 mg by mouth daily.     . cholecalciferol (VITAMIN D) 1000 units tablet Take 1,000  Units by mouth daily.    . Cranberry 450 MG TABS Take 450 mg by mouth daily.    . diclofenac sodium (VOLTAREN) 1 % GEL Apply 1 application topically 3 (three) times daily. To shoulders    . diphenhydrAMINE (BENADRYL) 25 MG tablet Take 25 mg by mouth every 8 (eight) hours. In addition pt. May take 23m by mouth every 6 hors as needed for rash    . furosemide (LASIX) 40 MG tablet Take 40 mg by mouth 2 (two) times daily.     . hydrocortisone 2.5 % cream Apply 1 application topically every 12 (twelve) hours. To arms and legs    . hydroxychloroquine (PLAQUENIL) 200 MG tablet Take 200 mg by mouth daily.     . Ledipasvir-Sofosbuvir (HARVONI) 90-400 MG TABS Take 1 tablet by mouth daily. (Patient taking differently: Take 1 tablet by mouth every evening. ) 28 tablet 2  . linaclotide (LINZESS) 290 MCG CAPS capsule Take 290 mcg by mouth daily before breakfast.    . losartan (COZAAR) 25 MG tablet Take 25 mg by mouth daily.    . methocarbamol (ROBAXIN) 750 MG tablet Take 750 mg by mouth 2 (two) times daily.     . metoCLOPramide (REGLAN) 5 MG tablet Take 5 mg by mouth 3 (three) times daily.    . mirtazapine (REMERON) 7.5 MG tablet Take 7.5 mg by mouth at bedtime.     . Multiple Vitamin (MULTIVITAMIN WITH MINERALS) TABS tablet Take 1 tablet by mouth daily.    . Nutritional Supplements (PROMOD) LIQD Take 30 mLs by mouth daily.    .Marland Kitchenomega-3 fish oil (MAXEPA) 1000 MG CAPS capsule Take 1 capsule by mouth daily.     . ondansetron (ZOFRAN) 4 MG tablet Take 4 mg by mouth every 4 (four) hours as needed for nausea or vomiting.    .Marland Kitchenoxybutynin (DITROPAN) 5 MG tablet Take 5 mg by mouth 2 (two) times daily.    . pantoprazole (PROTONIX) 40 MG tablet Take 40 mg by mouth daily.    . polyethylene glycol (MIRALAX / GLYCOLAX) packet Take 17 g by mouth every Monday, Wednesday, Friday, and Saturday at 6 PM. 30 each 5  . ribavirin (COPEGUS) 200 MG tablet Take 3 tablets (600 mg total) by mouth daily. (Patient taking differently: Take  600 mg/day by mouth every evening. ) 84 tablet 2  . sennosides-docusate sodium (SENOKOT-S) 8.6-50 MG tablet Take 2 tablets by mouth 2 (two) times daily. 120 tablet 3  . spironolactone (ALDACTONE) 100 MG tablet Take 1 tablet (100 mg total) by mouth daily. For edema 30 tablet 2  . traMADol (ULTRAM) 50 MG tablet Take 1 tablet (50  mg total) by mouth every 6 (six) hours as needed. 15 tablet 0  . carvedilol (COREG) 3.125 MG tablet Take 1 tablet (3.125 mg total) by mouth 2 (two) times daily. 180 tablet 3   No current facility-administered medications for this visit.     Allergies:   Patient has no known allergies.     ROS:  Please see the history of present illness.   Otherwise, review of systems are positive none.   All other systems are reviewed and negative.    PHYSICAL EXAM: BP 125/79   Pulse 94   Ht 5' (1.524 m)   Wt 146 lb (66.2 kg)   BMI 28.51 kg/m    PHYSICAL EXAM GEN:  No distress NECK:  No jugular venous distention at 90 degrees, waveform within normal limits, carotid upstroke brisk and symmetric, no bruits, no thyromegaly LYMPHATICS:  No cervical adenopathy LUNGS:  Clear to auscultation bilaterally BACK:  No CVA tenderness CHEST:  Unremarkable HEART:  S1 and S2 within normal limits, no S3, no S4, no clicks, no rubs, soft apical systolic murmur, no diastolic murmurs ABD:  Positive bowel sounds normal in frequency in pitch, no bruits, no rebound, no guarding, unable to assess midline mass or bruit with the patient seated. EXT:  2 plus pulses throughout, moderate edema, no cyanosis no clubbing, severe muscle wasting lower extremity with lower extremity paresis    EKG:  EKG is ordered today. Sinus rhythm, rate 94, axis within normal limits, intervals within normal limits, left ventricular hypertrophy with repolarization changes.   Recent Labs: 07/01/2017: B Natriuretic Peptide 296.5 02/10/2018: ALT 21 02/14/2018: BUN 6; Creatinine, Ser 0.74; Potassium 3.9; Sodium  137 03/21/2018: Hemoglobin 11.8; Platelets 269    Lipid Panel    Component Value Date/Time   CHOL 130 07/02/2017 0430   TRIG 102 07/02/2017 0430   HDL 33 (L) 07/02/2017 0430   CHOLHDL 3.9 07/02/2017 0430   VLDL 20 07/02/2017 0430   LDLCALC 77 07/02/2017 0430      Wt Readings from Last 3 Encounters:  04/04/18 146 lb (66.2 kg)  02/14/18 150 lb 2.1 oz (68.1 kg)  11/03/17 156 lb (70.8 kg)      Other studies Reviewed: Additional studies/ records that were reviewed today include:   Hospital records    ASSESSMENT AND PLAN:   Pleural effusion This was improved when she was in the hospital.  This improved and by exam it is not present today.  No change in therapy is indicated.  We discussed symptoms that could occur if she has any recurrent fluid and she would let us know.  NICM (nonischemic cardiomyopathy) ([place]) She did not have CAD.   EF 20-25% by echo Dec 2018.   I am going to increase the Coreg 9.375 mg twice daily.  She should come back in a couple of months and have titration possibly of her ARB or beta-blocker again which we will try to do slowly as her blood pressure has been marginal.  Once we get to a target or maximal therapy we can repeat an echocardiogram.   Paraplegia ([place]) I did call the nursing home today to make sure that she is getting her MiraLAX as she says she was not getting this and will avoid obstipation again.   Current medicines are reviewed at length with the patient today.  The patient does not have concerns regarding medicines.  The following changes have been made:   As above  Labs/ tests ordered today include:  None   Disposition:   FU with APP in 2 months.   Signed, Minus Breeding, MD  04/04/2018 12:38 PM    [facility]

## 2018-04-04 ENCOUNTER — Encounter: Payer: Self-pay | Admitting: Cardiology

## 2018-04-04 ENCOUNTER — Ambulatory Visit (INDEPENDENT_AMBULATORY_CARE_PROVIDER_SITE_OTHER): Payer: Medicare Other | Admitting: Cardiology

## 2018-04-04 VITALS — BP 125/79 | HR 94 | Ht 60.0 in | Wt 146.0 lb

## 2018-04-04 DIAGNOSIS — K5909 Other constipation: Secondary | ICD-10-CM

## 2018-04-04 DIAGNOSIS — I5022 Chronic systolic (congestive) heart failure: Secondary | ICD-10-CM

## 2018-04-04 MED ORDER — CARVEDILOL 3.125 MG PO TABS: 3.1250 mg | ORAL_TABLET | Freq: Two times a day (BID) | ORAL | 3 refills | Status: DC

## 2018-04-04 NOTE — Patient Instructions (Signed)
Medication Instructions:  START- Carvedilol 3.125 mg twice a day along with your 6.25 mg to make a total of 9.375 mg twice a day  If you need a refill on your cardiac medications before your next appointment, please call your pharmacy.  Labwork: None Ordered   Testing/Procedures: None Ordered   Follow-Up: Your physician wants you to follow-up in: 2 Months with Kerin Ransom.     Thank you for choosing [facility] at [facility]!!

## 2018-04-13 ENCOUNTER — Encounter: Payer: Self-pay | Admitting: Pulmonary Disease

## 2018-04-13 ENCOUNTER — Ambulatory Visit (INDEPENDENT_AMBULATORY_CARE_PROVIDER_SITE_OTHER): Payer: Medicare Other | Admitting: Pulmonary Disease

## 2018-04-13 VITALS — BP 124/74 | HR 91

## 2018-04-13 DIAGNOSIS — J90 Pleural effusion, not elsewhere classified: Secondary | ICD-10-CM | POA: Diagnosis not present

## 2018-04-13 NOTE — Progress Notes (Signed)
[facility]  Chief Complaint  Patient presents with  . Follow-up    Pt has improved since last ov with VS.    Constitutional: BP 124/74 (BP Location: Right Arm, Cuff Size: Normal)   Pulse 91   SpO2 98%   History of Present Illness: [name] is a 67 y.o. female with pleural effusion.  She had pleurx removed.  Not having dyspnea, chest pain, fever.  She is recovering from a cold.  Still has cough, but no sputum.  Followed in ID clinic for Hep C treatment.  Not having any issues with shortness of breath while sleeping.  She had chest xray in her facility recently and was told it was okay.  Physical Exam:  Appearance - well kempt, sitting in wheelchair ENMT - nasal mucosa moist, turbinates clear, midline nasal septum, no dental lesions, no gingival bleeding, no oral exudates, no tonsillar hypertrophy Neck - no masses, trachea midline, no thyromegaly, no elevation in JVP Respiratory - normal appearance of chest wall, normal respiratory effort w/o accessory muscle use, no dullness on percussion, no wheezing or rales CV - s1s2 regular rate and rhythm, no murmurs, no peripheral edema, radial pulses symmetric GI - soft, non tender Lymph - no adenopathy noted in neck and axillary areas MSK - decreased muscle bulk lower extremities Ext - no cyanosis, clubbing, or joint inflammation noted Skin - no rashes, lesions, or ulcers Neuro - oriented to person, place, and time; paraplegic Psych - normal mood and affect   Assessment/Plan:  Transudate right pleural effusion. - pleurx catheter removed 02/14/18 - clinically improved - reports having CXR recently that was improved - will get copy of her CXR and if improved, then no additional pulmonary follow up needed   Cirrhosis with Hep C positive. - f/u with ID and GI  Systolic CHF. - follows Dr.Hochrein with cardiology  History of rheumatoid arthritis. - she is on plaquenil   Patient  Instructions  Will get a copy of your recent chest xray report  Follow up with pulmonary as needed   Chesley Mires, MD [place] 04/13/2018, 2:29 PM Pager:  [phone]  Flow Sheet  Pulmonary tests: CT chest 06/28/17 >> large Rt pleural effusion Rt thoracentesis 06/29/17 >> glucose 94, LDH 70, protein 3.4, WBC 449 Rt thoracentesis 08/07/17 >> LDH 122, protein 3.4 CT chest 10/28/17 >> Rt base ATX, centrilobular and paraseptal emphysema, 3 mm nodule LUL, changes of cirrhosis  Cardiac tests: Echo 06/29/17 >> EF 20 to 25%, grade 2 DD, mod MR, PAS 51 mmHg  Past Medical History: She  has a past medical history of Acute systolic CHF (congestive heart failure) ([place]) (06/28/2017), Anemia (10/12/2008), CKD (chronic kidney disease), Depression, Diaphragmatic hernia without mention of obstruction or gangrene (08/29/2010), Edema (05/20/2009), Gastroparesis (10/05/2010), GERD (gastroesophageal reflux disease) (02/02/2009), colonic polyps, Hypertension (10/12/2008), Hypotension, unspecified (05/20/2009), Immobility syndrome (paraplegic) (1977), Iron deficiency anemia, unspecified (10/12/2008), Leiomyoma of uterus, Malignant neoplasm of breast (female), unspecified site (10/05/2010), Paraplegia ([place]), Pleural effusion, Pneumonia, Respiratory failure ([place]), and Rheumatoid arthritis ([place]) (01/17/2009).  Past Surgical History: She  has a past surgical history that includes Breast surgery (2003); Wound debridement (09/02/2008); Pressure ulcer debridement (2009); Back surgery; IR US Guide Vasc Access Right (06/23/2017); IR Fluoro Guide CV Line Right (06/23/2017); RIGHT/LEFT HEART CATH AND CORONARY ANGIOGRAPHY (N/A, 07/02/2017); IR Removal Tun Cv Cath W/O FL (07/15/2017); IR THORACENTESIS ASP PLEURAL SPACE W/IMG GUIDE (07/30/2017); and IR Removal Of Plural Cath W/Cuff (02/14/2018).  Family History:  Her family history includes Colon cancer in her maternal aunt; Hypertension in her maternal aunt,  maternal uncle, and mother; Liver disease in her brother and brother; Prostate cancer in her maternal uncle; Stroke in her mother.  Social History: She  reports that she quit smoking about 22 years ago. She has a 10.00 pack-year smoking history. She has never used smokeless tobacco. She reports that she does not drink alcohol or use drugs.  Medications: Allergies as of 04/13/2018   No Known Allergies     Medication List        Accurate as of 04/13/18  2:29 PM. Always use your most recent med list.          acetaminophen 325 MG tablet Commonly known as:  TYLENOL Take 650 mg by mouth every 6 (six) hours as needed.   baclofen 10 MG tablet Commonly known as:  LIORESAL Take 10 mg by mouth 3 (three) times daily.   bisacodyl 10 MG suppository Commonly known as:  DULCOLAX Place 1 suppository (10 mg total) rectally every Tuesday, Thursday, Saturday, and Sunday at 6 PM.   carvedilol 6.25 MG tablet Commonly known as:  COREG Take 1 tablet (6.25 mg total) by mouth 2 (two) times daily with a meal.   carvedilol 3.125 MG tablet Commonly known as:  COREG Take 1 tablet (3.125 mg total) by mouth 2 (two) times daily.   cholecalciferol 1000 units tablet Commonly known as:  VITAMIN D Take 1,000 Units by mouth daily.   Cranberry 450 MG Tabs Take 450 mg by mouth daily.   diclofenac sodium 1 % Gel Commonly known as:  VOLTAREN Apply 1 application topically 3 (three) times daily. To shoulders   diphenhydrAMINE 25 MG tablet Commonly known as:  BENADRYL Take 25 mg by mouth every 8 (eight) hours. In addition pt. May take 79m by mouth every 6 hors as needed for rash   furosemide 40 MG tablet Commonly known as:  LASIX Take 40 mg by mouth 2 (two) times daily.   hydrocortisone 2.5 % cream Apply 1 application topically every 12 (twelve) hours. To arms and legs   hydroxychloroquine 200 MG tablet Commonly known as:  PLAQUENIL Take 200 mg by mouth daily.   Ledipasvir-Sofosbuvir 90-400 MG  Tabs Take 1 tablet by mouth daily.   linaclotide 290 MCG Caps capsule Commonly known as:  LINZESS Take 290 mcg by mouth daily before breakfast.   losartan 25 MG tablet Commonly known as:  COZAAR Take 25 mg by mouth daily.   methocarbamol 750 MG tablet Commonly known as:  ROBAXIN Take 750 mg by mouth 2 (two) times daily.   metoCLOPramide 5 MG tablet Commonly known as:  REGLAN Take 5 mg by mouth 3 (three) times daily.   mirtazapine 7.5 MG tablet Commonly known as:  REMERON Take 7.5 mg by mouth at bedtime.   multivitamin with minerals Tabs tablet Take 1 tablet by mouth daily.   MYLANTA 200-200-20 MG/5ML suspension Generic drug:  alum & mag hydroxide-simeth Take 30 mLs by mouth every 6 (six) hours as needed (nausea).   omega-3 fish oil 1000 MG Caps capsule Commonly known as:  MAXEPA Take 1 capsule by mouth daily.   ondansetron 4 MG tablet Commonly known as:  ZOFRAN Take 4 mg by mouth every 4 (four) hours as needed for nausea or vomiting.   oxybutynin 5 MG tablet Commonly known as:  DITROPAN Take 5 mg by mouth 2 (two) times daily.   OYSTER CALCIUM 500 + D 500-200  MG-UNIT tablet Generic drug:  calcium-vitamin D Take 1 tablet by mouth 2 (two) times daily.   pantoprazole 40 MG tablet Commonly known as:  PROTONIX Take 40 mg by mouth daily.   polyethylene glycol packet Commonly known as:  MIRALAX / GLYCOLAX Take 17 g by mouth every Monday, Wednesday, Friday, and Saturday at 6 PM.   PROMOD Liqd Take 30 mLs by mouth daily.   ribavirin 200 MG tablet Commonly known as:  COPEGUS Take 3 tablets (600 mg total) by mouth daily.   sennosides-docusate sodium 8.6-50 MG tablet Commonly known as:  SENOKOT-S Take 2 tablets by mouth 2 (two) times daily.   spironolactone 100 MG tablet Commonly known as:  ALDACTONE Take 1 tablet (100 mg total) by mouth daily. For edema   traMADol 50 MG tablet Commonly known as:  ULTRAM Take 1 tablet (50 mg total) by mouth every 6 (six)  hours as needed.   ZYRTEC ALLERGY 10 MG tablet Generic drug:  cetirizine Take 10 mg by mouth daily.

## 2018-04-13 NOTE — Patient Instructions (Signed)
Will get a copy of your recent chest xray report  Follow up with pulmonary as needed

## 2018-04-15 ENCOUNTER — Telehealth: Payer: Self-pay

## 2018-04-15 NOTE — Telephone Encounter (Signed)
Patient's sister is calling with concern related to [place] .  She thought the medication was for 12 weeks and she was told by [facility] she has been without for 5 days.  The Sister does not think she has received the medication for the correct amount of time.   I spoke with Cassie K, RPh and was advised medications started on 01-13-18 and patient has taken medications for the proper amount of time.   I will inform the patient's sister and call the nursing facility.  Left voice mail message for Pamala Hurry with the above information. She called back to confirm receipt of information.  Sister states there was an interruption of medications and she has only received 8 weeks but is still taking the ribavirin.        Called nursing facility and spoke with nurse who states they were trying to reach our office all this week  and there was no return call.  There is not documentation to support phone call. Harvoni and ribavirin was started on 02-22-18. Harvoni ended on 04-08-18 and they are still giving the ribavirin.    Advised to discontinue ribavirin.   There seems to be confusion with orders after hospital discharge since nurse states orders initially were 12 weeks then changed to 8 weeks.    Discussed above with Cassie,  RPh.   Patient is returning for office visit on 05-11-18 with Dr Linus Salmons.   Laverle Patter, RN     Message left with nursing facility with appointment information at request of patient's sister.   Laverle Patter, RN

## 2018-04-18 NOTE — Telephone Encounter (Signed)
This situation is such a disaster.

## 2018-04-18 NOTE — Telephone Encounter (Signed)
Odds are high for relapse of HCV.  If she can complete the last 4 weeks, that would be ideal.

## 2018-04-18 NOTE — Telephone Encounter (Signed)
She started in June and three bottles of Harvoni and ribavirin were dispensed (two from our pharmacy and one from a random pharmacy which Inez Catalina verified a few weeks ago).  The nursing home was sporadically giving it to her and missing doses of ribavirin some days and Harvoni other days. It is truly a mess and makes me wonder about treating people in these types of facilities.  She isn't the only one we've had trouble with in nursing homes.  She's coming to see you on Wednesday.

## 2018-04-20 ENCOUNTER — Ambulatory Visit (INDEPENDENT_AMBULATORY_CARE_PROVIDER_SITE_OTHER): Payer: Medicare Other | Admitting: Internal Medicine

## 2018-04-20 ENCOUNTER — Encounter: Payer: Self-pay | Admitting: Internal Medicine

## 2018-04-20 VITALS — BP 103/70 | HR 92 | Temp 98.0°F

## 2018-04-20 DIAGNOSIS — K7460 Unspecified cirrhosis of liver: Secondary | ICD-10-CM

## 2018-04-20 DIAGNOSIS — B182 Chronic viral hepatitis C: Secondary | ICD-10-CM

## 2018-04-20 DIAGNOSIS — Z23 Encounter for immunization: Secondary | ICD-10-CM | POA: Diagnosis not present

## 2018-04-20 NOTE — Progress Notes (Signed)
   Subjective:    Patient ID: [name], female    DOB: 12-25-1950, 67 y.o.   MRN: [id number]  HPI She is here for follow-up of chronic hepatitis C. She has decompensated cirrhosis followed by Dr. Havery Moros who diagnosed her with chronic hepatitis C.  She is genotype 1a and I started her on Harvoni +600 mg of ribavirin daily.  She started it at the end of June and her facility however it appears she only took this for about 8 weeks.  It seems that the facility did not give her the third month and thought it had been done.  She is here today for follow-up.    Review of Systems  Constitutional: Negative for fatigue.  Gastrointestinal: Negative for diarrhea.  Skin: Negative for rash.  Neurological: Negative for dizziness.       Objective:   Physical Exam  Constitutional: She appears well-developed and well-nourished.  Eyes: No scleral icterus.  Cardiovascular: Normal rate, regular rhythm and normal heart sounds.  No murmur heard. Pulmonary/Chest: Effort normal and breath sounds normal. No respiratory distress.  Skin: No rash noted.   SH: continues to live at [name]:

## 2018-04-20 NOTE — Assessment & Plan Note (Signed)
She has undergone incomplete treatment with I believe 8 of 12 weeks completed and has been over 4 weeks since her last dose.  This is on top of having to take a ppi.  So far though her viral load is undetectable.  I will recheck today and if negative still, recheck in 3 months.  Maybe she will have success regardless.   rtc 3 months unless concerns

## 2018-04-20 NOTE — Assessment & Plan Note (Signed)
Stable at this time, following Dr. Havery Moros

## 2018-04-22 LAB — HEPATITIS C RNA QUANTITATIVE
HCV Quantitative Log: <1.18 Log IU/mL
HCV RNA, PCR, QN: NOT DETECTED [IU]/mL

## 2018-04-25 ENCOUNTER — Ambulatory Visit: Payer: Medicare Other | Admitting: Internal Medicine

## 2018-04-28 ENCOUNTER — Ambulatory Visit (INDEPENDENT_AMBULATORY_CARE_PROVIDER_SITE_OTHER): Payer: Medicare Other | Admitting: Gastroenterology

## 2018-04-28 ENCOUNTER — Encounter: Payer: Self-pay | Admitting: Gastroenterology

## 2018-04-28 ENCOUNTER — Other Ambulatory Visit (INDEPENDENT_AMBULATORY_CARE_PROVIDER_SITE_OTHER): Payer: Medicare Other

## 2018-04-28 VITALS — BP 106/70 | HR 104

## 2018-04-28 DIAGNOSIS — K219 Gastro-esophageal reflux disease without esophagitis: Secondary | ICD-10-CM | POA: Diagnosis not present

## 2018-04-28 DIAGNOSIS — B182 Chronic viral hepatitis C: Secondary | ICD-10-CM

## 2018-04-28 DIAGNOSIS — K7460 Unspecified cirrhosis of liver: Secondary | ICD-10-CM

## 2018-04-28 DIAGNOSIS — R1310 Dysphagia, unspecified: Secondary | ICD-10-CM | POA: Diagnosis not present

## 2018-04-28 DIAGNOSIS — K5900 Constipation, unspecified: Secondary | ICD-10-CM | POA: Diagnosis not present

## 2018-04-28 DIAGNOSIS — Z1211 Encounter for screening for malignant neoplasm of colon: Secondary | ICD-10-CM

## 2018-04-28 LAB — COMPREHENSIVE METABOLIC PANEL
ALK PHOS: 68 U/L (ref 39–117)
ALT: 18 U/L (ref 0–35)
AST: 13 U/L (ref 0–37)
Albumin: 3.9 g/dL (ref 3.5–5.2)
BUN: 9 mg/dL (ref 6–23)
CO2: 29 mEq/L (ref 19–32)
CREATININE: 0.75 mg/dL (ref 0.40–1.20)
Calcium: 10 mg/dL (ref 8.4–10.5)
Chloride: 102 mEq/L (ref 96–112)
GFR: 98.9 mL/min (ref >60.00)
GLUCOSE: 120 mg/dL — AB (ref 70–99)
Potassium: 3.2 mEq/L — ABNORMAL LOW (ref 3.5–5.1)
SODIUM: 137 meq/L (ref 135–145)
TOTAL PROTEIN: 8.5 g/dL — AB (ref 6.0–8.3)
Total Bilirubin: 0.2 mg/dL (ref 0.2–1.2)

## 2018-04-28 NOTE — Patient Instructions (Signed)
If you are age 67 or older, your body mass index should be between 23-30. Your There is no height or weight on file to calculate BMI. If this is out of the aforementioned range listed, please consider follow up with your Primary Care Provider.  If you are age 67 or younger, your body mass index should be between 19-25. Your There is no height or weight on file to calculate BMI. If this is out of the aformentioned range listed, please consider follow up with your Primary Care Provider.   You have been scheduled for a Barium Esophogram at [facility] (1st floor of the hospital) on Tuesday, 67-22-19 at 10:30am. Please arrive 15 minutes prior to your appointment for registration. Make certain not to have anything to eat or drink 3 hours prior to your test. If you need to reschedule for any reason, please contact radiology at [phone] to do so. __________________________________________________________________ A barium swallow is an examination that concentrates on views of the esophagus. This tends to be a double contrast exam (barium and two liquids which, when combined, create a gas to distend the wall of the oesophagus) or single contrast (non-ionic iodine based). The study is usually tailored to your symptoms so a good history is essential. Attention is paid during the study to the form, structure and configuration of the esophagus, looking for functional disorders (such as aspiration, dysphagia, achalasia, motility and reflux) EXAMINATION You may be asked to change into a gown, depending on the type of swallow being performed. A radiologist and radiographer will perform the procedure. The radiologist will advise you of the type of contrast selected for your procedure and direct you during the exam. You will be asked to stand, sit or lie in several different positions and to hold a small amount of fluid in your mouth before being asked to swallow while the imaging is performed .In some  instances you may be asked to swallow barium coated marshmallows to assess the motility of a solid food bolus. The exam can be recorded as a digital or video fluoroscopy procedure. POST PROCEDURE It will take 1-2 days for the barium to pass through your system. To facilitate this, it is important, unless otherwise directed, to increase your fluids for the next 24-48hrs and to resume your normal diet.  This test typically takes about 30 minutes to perform. ________________________________________________________________  Please go to the lab in the basement of our building to have lab work done as you leave today.  _______________________________________________________________  Your provider has ordered Cologuard testing as an option for colon cancer screening. This is performed by [employer] and may be out of network with your insurance. PRIOR to completing the test, it is YOUR responsibility to contact your insurance about covered benefits for this test. Your out of pocket expense could be anywhere from $0.00 to $649.00.   When you call to check coverage with your insurer, please provide the following information:   -The ONLY provider of Cologuard is [place] code for Cologuard is [phone].  [employer] NPI # [id number]  -[employer] Tax ID # [id number]   We have already sent your demographic and insurance information to [employer] (phone number [phone]) and they should contact you within the next week regarding your test. If you have not heard from them within the next week, please call our office at [phone].  _______________________________________________________________   Increase Omeprazole to twice a day.  Discontinue Reglan.  Thank  you for entrusting me with your care and for choosing [facility], Dr. Dayton Cellar
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
## 2018-04-29 LAB — AFP TUMOR MARKER: AFP-Tumor Marker: 11.8 ng/mL — ABNORMAL HIGH

## 2018-05-11 ENCOUNTER — Telehealth: Payer: Self-pay

## 2018-05-11 ENCOUNTER — Ambulatory Visit: Payer: Medicare Other | Admitting: Internal Medicine

## 2018-05-11 NOTE — Telephone Encounter (Signed)
-----   Message from Roetta Sessions, [place] sent at 04/28/2018  5:33 PM EDT ----- Regarding: cologuard Check on cologuard.  Pt seen on 04-28-18.  Pt lives in Home.  Her sister is the contact person.  Faxed request to [employer] 10-3.

## 2018-05-11 NOTE — Telephone Encounter (Signed)
[place], where the pt resides. Spoke to [employer].  He said they have the kit and will try to get a sample today and get in the mail to [employer].

## 2018-05-16 ENCOUNTER — Ambulatory Visit: Payer: Medicare Other | Admitting: Pulmonary Disease

## 2018-05-17 ENCOUNTER — Ambulatory Visit (HOSPITAL_COMMUNITY)
Admission: RE | Admit: 2018-05-17 | Discharge: 2018-05-17 | Disposition: A | Discharge status: Home / Self Care | Payer: Medicare Other | Source: Ambulatory Visit | Attending: Gastroenterology | Admitting: Gastroenterology

## 2018-05-17 DIAGNOSIS — K5900 Constipation, unspecified: Secondary | ICD-10-CM | POA: Insufficient documentation

## 2018-05-17 DIAGNOSIS — K219 Gastro-esophageal reflux disease without esophagitis: Secondary | ICD-10-CM | POA: Insufficient documentation

## 2018-05-17 DIAGNOSIS — K7460 Unspecified cirrhosis of liver: Secondary | ICD-10-CM | POA: Diagnosis not present

## 2018-05-17 DIAGNOSIS — K224 Dyskinesia of esophagus: Secondary | ICD-10-CM | POA: Diagnosis not present

## 2018-05-17 DIAGNOSIS — Z1211 Encounter for screening for malignant neoplasm of colon: Secondary | ICD-10-CM

## 2018-05-17 DIAGNOSIS — R1310 Dysphagia, unspecified: Secondary | ICD-10-CM | POA: Insufficient documentation

## 2018-05-17 DIAGNOSIS — B182 Chronic viral hepatitis C: Secondary | ICD-10-CM | POA: Diagnosis present

## 2018-05-17 NOTE — Telephone Encounter (Signed)
Called [facility].  LM for Reggie to call me back.

## 2018-05-23 NOTE — Telephone Encounter (Signed)
Called and spoke to [place]. He said the sample was sent back to [employer] one day last week, maybe Thursday or Friday. According to [employer] it has not been rec'd.  They tracked the return package and it was never picked up.  They will reach out to the patient at [place] and request sample be submitted.

## 2018-05-26 NOTE — Telephone Encounter (Signed)
Got a fax from [employer] that "the sample stability limit had been exceeded. The patient will be contacted to initiate a new sample collection".

## 2018-06-06 NOTE — Progress Notes (Signed)
Cardiology Office Note   Date:  06/07/2018   ID:  [name], DOB 06/16/1951, MRN [id number]  PCP:  [name], [place]  Cardiologist:   [name] Breeding, MD   Chief Complaint  Patient presents with  . Cardiomyopathy      History of Present Illness: [name] is a 67 y.o. female who presents for follow up of congestive heart failure.  She was in the hospital in Dec 2018.   In 2016 she had a normal EF.  However, during the recent hospitalization she had an EF of 20%. Cath demonstrated normal coronaries.  She had thoracentesis of 1.2 liters on the left.  She went back to the nursing home where she lives but required another thoracentesis.  This one was on the right.  She had a Pleurex catheter placed.  She was in [facility] for that event in January of this year.  Since I saw her in May she was again in the hospital.  I reviewed these records for this visit.   She was again in the hospital with obstipation and UTI.  Pleurex was removed.   Since that hospitalization she is done relatively well.  I did review pulmonary records and they will dismiss her from the clinic since there is been no active problems.  However, she does tell me she has a dry cough.  This is nonproductive.  This was mentioned in the pulmonary notes.  She is not describing PND or orthopnea.  She is not describing fevers or chills.  She gets around in her wheelchair.  She has a little bit of chest tightness sometimes when she is coughing.  Its a little bit worse at night and a sensation that she has a slight problem getting a deep breath.  This might be a little worse lying flat.  She was just started on Zyrtec.   Past Medical History:  Diagnosis Date  . Acute systolic CHF (congestive heart failure) ([name]) 06/28/2017   EF was normal 2016, now 20% with grade 2 diastolic dysfunction, no significant CAD at cath  . Anemia 10/12/2008  . CKD (chronic kidney disease)   . Depression   . Diaphragmatic hernia  without mention of obstruction or gangrene 08/29/2010  . Edema 05/20/2009  . Gastroparesis 10/05/2010  . GERD (gastroesophageal reflux disease) 02/02/2009  . Hx of colonic polyps   . Hypertension 10/12/2008  . Hypotension, unspecified 05/20/2009  . Immobility syndrome (paraplegic) 1977  . Iron deficiency anemia, unspecified 10/12/2008  . Leiomyoma of uterus   . Malignant neoplasm of breast (female), unspecified site 10/05/2010   2003, 2013   . Paraplegia ([place])   . Pleural effusion   . Pneumonia   . Respiratory failure ([place])   . Rheumatoid arthritis ([name]) 01/17/2009    Past Surgical History:  Procedure Laterality Date  . [place]  . BREAST SURGERY  2003   Bilateral mastectomy  . IR FLUORO GUIDE CV LINE RIGHT  06/23/2017  . IR REMOVAL OF PLURAL CATH W/CUFF  02/14/2018  . IR REMOVAL TUN CV CATH W/O FL  07/15/2017  . IR THORACENTESIS ASP PLEURAL SPACE W/IMG GUIDE  07/30/2017  . IR US GUIDE VASC ACCESS RIGHT  06/23/2017  . PRESSURE ULCER DEBRIDEMENT  2009   on back  . RIGHT/LEFT HEART CATH AND CORONARY ANGIOGRAPHY N/A 07/02/2017   Procedure: RIGHT/LEFT HEART CATH AND CORONARY ANGIOGRAPHY;  Surgeon: Martinique, Peter M, MD;  Location: [facility]  INVASIVE CV LAB;  Service: Cardiovascular;  Laterality: N/A;  . WOUND DEBRIDEMENT  09/02/2008   Large sacral back open wound     Current Outpatient Medications  Medication Sig Dispense Refill  . acetaminophen (TYLENOL) 325 MG tablet Take 650 mg by mouth every 6 (six) hours as needed.    . baclofen (LIORESAL) 10 MG tablet Take 10 mg by mouth 3 (three) times daily.     . Benzocaine 10 MG LOZG Use as directed 1 lozenge in the mouth or throat every 2 (two) hours as needed.    . bisacodyl (DULCOLAX) 10 MG suppository Place 1 suppository (10 mg total) rectally every Tuesday, Thursday, Saturday, and Sunday at 6 PM. 30 suppository 3  . calcium-vitamin D (OYSTER CALCIUM 500 + D) 500-200 MG-UNIT per tablet Take 1 tablet by mouth 2 (two)  times daily.     . carvedilol (COREG) 12.5 MG tablet Take 1 tablet (12.5 mg total) by mouth 2 (two) times daily with a meal. 180 tablet 3  . cetirizine (ZYRTEC ALLERGY) 10 MG tablet Take 10 mg by mouth daily.     . cholecalciferol (VITAMIN D) 1000 units tablet Take 1,000 Units by mouth daily.    . Cranberry 450 MG TABS Take 450 mg by mouth daily.    . diclofenac sodium (VOLTAREN) 1 % GEL Apply 1 application topically 3 (three) times daily. To shoulders    . docusate sodium (COLACE) 100 MG capsule Take 100 mg by mouth 3 (three) times daily.    . furosemide (LASIX) 40 MG tablet Take 40 mg by mouth 2 (two) times daily.     . hydrocortisone 2.5 % cream Apply 1 application topically every 12 (twelve) hours. To arms and legs    . hydroxychloroquine (PLAQUENIL) 200 MG tablet Take 200 mg by mouth daily.     . Ledipasvir-Sofosbuvir (HARVONI) 90-400 MG TABS Take 1 tablet by mouth daily. (Patient taking differently: Take 1 tablet by mouth every evening. ) 28 tablet 2  . linaclotide (LINZESS) 290 MCG CAPS capsule Take 290 mcg by mouth daily before breakfast.    . losartan (COZAAR) 25 MG tablet Take 25 mg by mouth daily.    . methocarbamol (ROBAXIN) 750 MG tablet Take 750 mg by mouth 2 (two) times daily.     . mirtazapine (REMERON) 7.5 MG tablet Take 7.5 mg by mouth at bedtime.     . Multiple Vitamin (MULTIVITAMIN WITH MINERALS) TABS tablet Take 1 tablet by mouth daily.    Marland Kitchen omeprazole (PRILOSEC) 20 MG capsule Take 20 mg by mouth 2 (two) times daily.    . ondansetron (ZOFRAN) 4 MG tablet Take 4 mg by mouth every 4 (four) hours as needed for nausea or vomiting.    Marland Kitchen oxybutynin (DITROPAN) 5 MG tablet Take 5 mg by mouth 2 (two) times daily.    . polyethylene glycol (MIRALAX / GLYCOLAX) packet Take 17 g by mouth every Monday, Wednesday, Friday, and Saturday at 6 PM. 30 each 5  . sennosides-docusate sodium (SENOKOT-S) 8.6-50 MG tablet Take 2 tablets by mouth 2 (two) times daily. 120 tablet 3  . spironolactone  (ALDACTONE) 100 MG tablet Take 1 tablet (100 mg total) by mouth daily. For edema 30 tablet 2  . traMADol (ULTRAM) 50 MG tablet Take 1 tablet (50 mg total) by mouth every 6 (six) hours as needed. 15 tablet 0   No current facility-administered medications for this visit.     Allergies:   Patient has no known allergies.  ROS:  Please see the history of present illness.   Otherwise, review of systems are positive none.   All other systems are reviewed and negative.    PHYSICAL EXAM: BP 100/80   Pulse (!) 102   Ht 5' 5.5" (1.664 [name])   SpO2 98%   BMI 23.93 kg/[name]    PHYSICAL EXAM GEN:  No distress NECK:  No jugular venous distention at 90 degrees, waveform within normal limits, carotid upstroke brisk and symmetric, no bruits, no thyromegaly LYMPHATICS:  No cervical adenopathy LUNGS:  Clear to auscultation bilaterally BACK:  No CVA tenderness CHEST:  Unremarkable HEART:  S1 and S2 within normal limits, no S3, no S4, no clicks, no rubs, no  murmurs ABD:  Positive bowel sounds normal in frequency in pitch, no bruits, no rebound, no guarding, unable to assess midline mass or bruit with the patient seated. EXT:  2 plus pulses throughout, no edema, no cyanosis no clubbing   EKG:  EKG is not ordered today.   Recent Labs: 07/01/2017: B Natriuretic Peptide 296.5 03/21/2018: Hemoglobin 11.8; Platelets 269 04/28/2018: ALT 18; BUN 9; Creatinine, Ser 0.75; Potassium 3.2; Sodium 137    Lipid Panel    Component Value Date/Time   CHOL 130 07/02/2017 0430   TRIG 102 07/02/2017 0430   HDL 33 (L) 07/02/2017 0430   CHOLHDL 3.9 07/02/2017 0430   VLDL 20 07/02/2017 0430   LDLCALC 77 07/02/2017 0430      Wt Readings from Last 3 Encounters:  04/04/18 146 lb (66.2 kg)  02/14/18 150 lb 2.1 oz (68.1 kg)  11/03/17 156 lb (70.8 kg)      Other studies Reviewed: Additional studies/ records that were reviewed today include:   None    ASSESSMENT AND PLAN:   Pleural effusion I do not  suspect  any recurrence of this by exam.  We will follow this clinically  NICM (nonischemic cardiomyopathy) ([place]) She did not have CAD.   EF 20-25% by echo Dec 2018.   I am going to try to increase the carvedilol again to 12-1/2 twice a day.  I do not think she has any left-sided heart failure symptoms at this time.  Once I get the maximal medical therapy I will repeat an echocardiogram.   Cough She has a dry cough that I do not think his heart failure.  She was just started on Zyrtec.  I have suggested discussing this further with the pulmonary doctors if it continues.  It is a very small possibility ARB but unlikely  Hypokalemia I note that her potassium was low in October and I cannot find out whether this was supplemented.  We will go ahead and draw blood again to check on this.   Current medicines are reviewed at length with the patient today.  The patient does not have concerns regarding medicines.  The following changes have been made:   As above  Labs/ tests ordered today include:   None   Disposition:   FU with me in 2 potassium was 3.2 months.   Signed, [name] Breeding, MD  06/07/2018 2:13 PM    [facility]

## 2018-06-07 ENCOUNTER — Encounter: Payer: Self-pay | Admitting: Cardiology

## 2018-06-07 ENCOUNTER — Ambulatory Visit (INDEPENDENT_AMBULATORY_CARE_PROVIDER_SITE_OTHER): Payer: Medicare Other | Admitting: Cardiology

## 2018-06-07 VITALS — BP 100/80 | HR 102 | Ht 65.5 in

## 2018-06-07 DIAGNOSIS — Z79899 Other long term (current) drug therapy: Secondary | ICD-10-CM

## 2018-06-07 DIAGNOSIS — I420 Dilated cardiomyopathy: Secondary | ICD-10-CM | POA: Diagnosis not present

## 2018-06-07 DIAGNOSIS — R05 Cough: Secondary | ICD-10-CM | POA: Diagnosis not present

## 2018-06-07 DIAGNOSIS — E876 Hypokalemia: Secondary | ICD-10-CM | POA: Diagnosis not present

## 2018-06-07 DIAGNOSIS — R059 Cough, unspecified: Secondary | ICD-10-CM

## 2018-06-07 DIAGNOSIS — J90 Pleural effusion, not elsewhere classified: Secondary | ICD-10-CM

## 2018-06-07 DIAGNOSIS — R053 Chronic cough: Secondary | ICD-10-CM | POA: Insufficient documentation

## 2018-06-07 MED ORDER — CARVEDILOL 12.5 MG PO TABS: 12.5000 mg | ORAL_TABLET | Freq: Two times a day (BID) | ORAL | 3 refills | Status: DC

## 2018-06-07 NOTE — Patient Instructions (Addendum)
Medication Instructions:  INCREASE- Carvedilol 12.5 mg twice a day  If you need a refill on your cardiac medications before your next appointment, please call your pharmacy.  Labwork: BMP Today  If you have labs (blood work) drawn today and your tests are completely normal, you will receive your results only by: Marland Kitchen MyChart Message (if you have MyChart) OR . A paper copy in the mail If you have any lab test that is abnormal or we need to change your treatment, we will call you to review the results.  Testing/Procedures: None Ordered  Special Instructions: Take Patient blood pressure once a day in the morning 3 times a week.  Follow-Up: . You will need a follow up appointment in 2 Months.    At [facility], you and your health needs are our priority.  As part of our continuing mission to provide you with exceptional heart care, we have created designated Provider Care Teams.  These Care Teams include your primary Cardiologist (physician) and Advanced Practice Providers (APPs -  Physician Assistants and Nurse Practitioners) who all work together to provide you with the care you need, when you need it.   Thank you for choosing [facility] at [facility]!!

## 2018-06-08 LAB — BASIC METABOLIC PANEL
BUN / CREAT RATIO: 22 (ref 12–28)
BUN: 13 mg/dL (ref 8–27)
CALCIUM: 9.5 mg/dL (ref 8.7–10.3)
CHLORIDE: 99 mmol/L (ref 96–106)
CO2: 23 mmol/L (ref 20–29)
Creatinine, Ser: 0.6 mg/dL (ref 0.57–1.00)
GFR, EST AFRICAN AMERICAN: 109 mL/min/{1.73_m2} (ref >59)
GFR, EST NON AFRICAN AMERICAN: 95 mL/min/{1.73_m2} (ref >59)
Glucose: 134 mg/dL — ABNORMAL HIGH (ref 65–99)
POTASSIUM: 3.8 mmol/L (ref 3.5–5.2)
Sodium: 138 mmol/L (ref 134–144)

## 2018-06-20 ENCOUNTER — Telehealth: Payer: Self-pay

## 2018-06-20 NOTE — Telephone Encounter (Signed)
-----   Message from Roetta Sessions, [place] sent at 05/04/2018  1:52 PM EDT ----- Regarding: labs Repeat AFP in late November.  Pt is at [facility]: [phone].  Contact them to have AFP done.  See lab note from 10-3

## 2018-06-20 NOTE — Telephone Encounter (Signed)
Called and spoke to the nurse for pt, [place].  She took the order for the AFP lab.  They will fax [place] the results.

## 2018-06-27 NOTE — Telephone Encounter (Signed)
Called and spoke to [place] at [place].Marland Kitchen He indicated he will put in the order for an AFP and it will be drawn tomorrow morning, Tuesday, 12-3. They will fax it to [place] within a day or two.

## 2018-07-01 NOTE — Telephone Encounter (Signed)
Called [facility] and spoke to [place]. He can't see that they have run the AFP.  He will call the lab and call me back.

## 2018-07-01 NOTE — Telephone Encounter (Signed)
Reggie called back . AFP was run. Results Should be back within a day or two and they will fax [place].

## 2018-07-05 NOTE — Telephone Encounter (Signed)
Labs rec'd and placed on Dr. Doyne Keel desk.

## 2018-07-07 ENCOUNTER — Telehealth: Payer: Self-pay | Admitting: Gastroenterology

## 2018-07-07 ENCOUNTER — Ambulatory Visit (INDEPENDENT_AMBULATORY_CARE_PROVIDER_SITE_OTHER): Payer: Medicare Other | Admitting: Neurology

## 2018-07-07 ENCOUNTER — Encounter: Payer: Self-pay | Admitting: Neurology

## 2018-07-07 VITALS — BP 108/76 | HR 85 | Ht 65.5 in

## 2018-07-07 DIAGNOSIS — G8220 Paraplegia, unspecified: Secondary | ICD-10-CM | POA: Insufficient documentation

## 2018-07-07 DIAGNOSIS — R772 Abnormality of alphafetoprotein: Secondary | ICD-10-CM
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
## 2018-07-07 NOTE — Telephone Encounter (Signed)
You have been scheduled for an MRI at [facility]  on 07/14/18 . Your appointment time is 3 pm. Please arrive 30 minutes prior to your appointment time for registration purposes. Please make certain not to have anything to eat or drink 4 hours prior to your test. In addition, if you have any metal in your body, have a pacemaker or defibrillator, please be sure to let your ordering physician know. This test typically takes 45 minutes to 1 hour to complete. Should you need to reschedule, please call [phone] to do so.

## 2018-07-07 NOTE — Telephone Encounter (Signed)
Patient's labs came in:  AFP level drawn on 06/28/18 shows a value of 13.6. This is a bit higher than when previously checked a few months ago (11s). I think we should proceed with [place] screening now (was otherwise due in January). Recommend MRI of the liver with contrast given elevated AFP.   Patty can you help coordinate MRI liver for this patient with contrast. Thanks

## 2018-07-07 NOTE — Telephone Encounter (Signed)
Yvette Rack RN with the skilled nursing facility as been notified of the MRI and instructions as well as the information faxed to [phone].

## 2018-07-14 ENCOUNTER — Ambulatory Visit (HOSPITAL_COMMUNITY)
Admission: RE | Admit: 2018-07-14 | Discharge: 2018-07-14 | Disposition: A | Discharge status: Home / Self Care | Payer: Medicare Other | Source: Ambulatory Visit | Attending: Gastroenterology | Admitting: Gastroenterology

## 2018-07-14 DIAGNOSIS — R772 Abnormality of alphafetoprotein: Secondary | ICD-10-CM | POA: Insufficient documentation

## 2018-07-14 MED ORDER — GADOBUTROL 1 MMOL/ML IV SOLN: 6.0000 mL | Freq: Once | INTRAVENOUS | Status: DC | PRN

## 2018-07-14 NOTE — Progress Notes (Signed)
Pt unable to clear bore of scanner due to contracted legs. All attempts were made to get pt into bore of scanner. Per pt she has in the past had to be scanned in larger scanners due to this issue.

## 2018-07-18 ENCOUNTER — Ambulatory Visit (INDEPENDENT_AMBULATORY_CARE_PROVIDER_SITE_OTHER): Payer: Medicare Other | Admitting: Internal Medicine

## 2018-07-18 ENCOUNTER — Encounter: Payer: Self-pay | Admitting: Internal Medicine

## 2018-07-18 VITALS — Ht 65.0 in | Wt 156.0 lb

## 2018-07-18 DIAGNOSIS — B182 Chronic viral hepatitis C: Secondary | ICD-10-CM

## 2018-07-18 DIAGNOSIS — K7460 Unspecified cirrhosis of liver: Secondary | ICD-10-CM | POA: Diagnosis not present

## 2018-07-18 NOTE — Progress Notes (Signed)
   Subjective:    Patient ID: [name], female    DOB: August 05, 1950, 67 y.o.   MRN: [id number]  HPI She is here for follow-up of chronic hepatitis C. She has decompensated cirrhosis followed by Dr. Havery Moros who diagnosed her with chronic hepatitis C.  She is genotype 1a and I started her on Harvoni +600 mg of ribavirin daily.  She started it at the end of June and her facility however it appears she only took this for about 8 weeks.  It seems that the facility did not give her the third month and thought it had been done.  She is here today for follow-up now over 3 months since she completed treatment.  No new issues.  Was scheduled for MRI of liver but due to contractures, was not able to get it done at [facility].     Review of Systems  Constitutional: Negative for fatigue.  Gastrointestinal: Negative for diarrhea.  Skin: Negative for rash.  Neurological: Negative for dizziness.       Objective:   Physical Exam Constitutional:      Appearance: She is well-developed.  Eyes:     General: No scleral icterus. Cardiovascular:     Rate and Rhythm: Normal rate and regular rhythm.     Heart sounds: Normal heart sounds. No murmur.  Pulmonary:     Effort: Pulmonary effort is normal. No respiratory distress.     Breath sounds: Normal breath sounds.  Skin:    Findings: No rash.    SH: continues to live at [place]:

## 2018-07-18 NOTE — Assessment & Plan Note (Signed)
Followed by Dr. Havery Moros and had MRI scheduled and needs to be rescheduled.

## 2018-07-18 NOTE — Assessment & Plan Note (Signed)
Will check SVR 12 today.  Hopefully cured despite treatment interruption and shortned course.   She will return for further options if positive again

## 2018-07-22 ENCOUNTER — Telehealth: Payer: Self-pay | Admitting: Gastroenterology

## 2018-07-22 DIAGNOSIS — K7460 Unspecified cirrhosis of liver: Secondary | ICD-10-CM

## 2018-07-22 NOTE — Telephone Encounter (Signed)
-----   Message from Yetta Flock, MD sent at 07/22/2018  7:47 AM EST ----- [name] this patient failed to follow through with her MRI liver. Can you contact her and see if she is willing to schedule? Thanks much ----- Message ----- From: SYSTEM Sent: 07/19/2018  12:08 AM EST To: Yetta Flock, MD

## 2018-07-22 NOTE — Telephone Encounter (Signed)
Called and spoke to [place], Therapist, sports at [facility].  According to [facility] the pt is able to lay on back and could fit into MRI machine. OK to schedule MRI.

## 2018-07-22 NOTE — Telephone Encounter (Signed)
Pt sister returned your call

## 2018-07-22 NOTE — Telephone Encounter (Signed)
Called and LM for pt to call back to discuss MRI that was cancelled.

## 2018-07-22 NOTE — Telephone Encounter (Signed)
Called and spoke to sister who indicated that pt is unable to lay on her back due to her legs being "contracted".  Called [facility] for [facility]: Pt must lay on her back to have an MRI of Liver.  The bore of the machine at [facility] is 70 cm (~ 27 inches) in diameter and if the pt can fit and lay on her back they can support her on the sides but she must be able to lay comfortably for the duration of the test. Called and LM for Verdene Lennert, Mudlogger of nursing at [facility] where pt resides to discuss what pt's ability would be to fit in machine and be on her back with side support.

## 2018-07-23 LAB — COMPLETE METABOLIC PANEL WITH GFR
AG Ratio: 1 (calc) (ref 1.0–2.5)
ALBUMIN MSPROF: 4.1 g/dL (ref 3.6–5.1)
ALKALINE PHOSPHATASE (APISO): 96 U/L (ref 33–130)
ALT: 17 U/L (ref 6–29)
AST: 13 U/L (ref 10–35)
BUN: 16 mg/dL (ref 7–25)
CALCIUM: 10.2 mg/dL (ref 8.6–10.4)
CO2: 27 mmol/L (ref 20–32)
CREATININE: 0.58 mg/dL (ref 0.50–0.99)
Chloride: 103 mmol/L (ref 98–110)
GFR, EST NON AFRICAN AMERICAN: 95 mL/min/{1.73_m2} (ref >=60)
GFR, Est African American: 111 mL/min/{1.73_m2} (ref >=60)
GLUCOSE: 111 mg/dL — AB (ref 65–99)
Globulin: 4 g/dL (calc) — ABNORMAL HIGH (ref 1.9–3.7)
Potassium: 4.3 mmol/L (ref 3.5–5.3)
Sodium: 137 mmol/L (ref 135–146)
Total Bilirubin: 0.3 mg/dL (ref 0.2–1.2)
Total Protein: 8.1 g/dL (ref 6.1–8.1)

## 2018-07-23 LAB — CBC WITH DIFFERENTIAL/PLATELET
Absolute Monocytes: 360 cells/uL (ref 200–950)
BASOS ABS: 12 {cells}/uL (ref 0–200)
Basophils Relative: 0.2 %
EOS PCT: 2.4 %
Eosinophils Absolute: 139 cells/uL (ref 15–500)
HCT: 37.7 % (ref 35.0–45.0)
HEMOGLOBIN: 12.2 g/dL (ref 11.7–15.5)
Lymphs Abs: 2013 cells/uL (ref 850–3900)
MCH: 25.1 pg — AB (ref 27.0–33.0)
MCHC: 32.4 g/dL (ref 32.0–36.0)
MCV: 77.4 fL — ABNORMAL LOW (ref 80.0–100.0)
MONOS PCT: 6.2 %
MPV: 10.4 fL (ref 7.5–12.5)
NEUTROS ABS: 3277 {cells}/uL (ref 1500–7800)
Neutrophils Relative %: 56.5 %
PLATELETS: 250 10*3/uL (ref 140–400)
RBC: 4.87 10*6/uL (ref 3.80–5.10)
RDW: 15.9 % — AB (ref 11.0–15.0)
TOTAL LYMPHOCYTE: 34.7 %
WBC: 5.8 10*3/uL (ref 3.8–10.8)

## 2018-07-23 LAB — HEPATITIS C RNA QUANTITATIVE
HCV QUANT LOG: NOT DETECTED {Log_IU}/mL
HCV RNA, PCR, QN: <15 IU/mL

## 2018-07-25 NOTE — Telephone Encounter (Signed)
Pt has been scheduled for an MRI at [facility] located at [place]. [address] [employer] on Tuesday, 08-02-2018 at 2:00pm. Pt to arrive 30 minutes prior to appointment, NPO 4 hours.  Letter sent to patient at [facility].  Called [facility] and notified scheduler of appt.  She confirmed appt and will have pt at [facility] by 1:30pm.  Called and spoke to RN, [place], and notified him that she needs to be NPO after 10:00am on 08-02-2018.  He confirmed understanding.  Called and notified Pamala Hurry, pt's sister.  She was very appreciative of being notified.

## 2018-08-02 ENCOUNTER — Encounter (HOSPITAL_COMMUNITY): Payer: Self-pay

## 2018-08-02 ENCOUNTER — Ambulatory Visit (HOSPITAL_COMMUNITY)
Admission: RE | Admit: 2018-08-02 | Discharge: 2018-08-02 | Disposition: A | Discharge status: Home / Self Care | Payer: Medicare Other | Source: Ambulatory Visit | Attending: Gastroenterology | Admitting: Gastroenterology

## 2018-08-02 DIAGNOSIS — K7460 Unspecified cirrhosis of liver: Secondary | ICD-10-CM | POA: Insufficient documentation

## 2018-08-02 NOTE — Progress Notes (Signed)
Pt came to [facility] MRI for exam today.  Pt's legs are contractured and do not straighten out.  Pt's legs did not fit in the bore of the scanner.  This had also been tried at [facility].  The only option I see would be to literally paralyze her under anesthesia and then maybe her legs could be straightened out but I'm not sure how much the tendons would relax under that, you might consult anesthesia services.  A true open MRI might be possible laying on her side but unsure if that would work either.

## 2018-08-03 ENCOUNTER — Other Ambulatory Visit: Payer: Self-pay

## 2018-08-03 ENCOUNTER — Telehealth: Payer: Self-pay

## 2018-08-03 DIAGNOSIS — B182 Chronic viral hepatitis C: Secondary | ICD-10-CM

## 2018-08-03 DIAGNOSIS — K7460 Unspecified cirrhosis of liver: Secondary | ICD-10-CM

## 2018-08-03 DIAGNOSIS — K760 Fatty (change of) liver, not elsewhere classified: Secondary | ICD-10-CM

## 2018-08-03 NOTE — Telephone Encounter (Signed)
CT of liver with contrast scheduled for 1-22 at 2:00pm. Arrive at 1:45pm Drink 1 bottle of contrast at 1:00pm. (CT machine is 72 cm). Pt has had CT in the past. [place] CT indicated she would have no problem fitting in the machine.

## 2018-08-03 NOTE — Telephone Encounter (Signed)
Called [facility] to notify them of pt's appt for CT. No one was available to talk to,

## 2018-08-03 NOTE — Progress Notes (Unsigned)
error 

## 2018-08-03 NOTE — Telephone Encounter (Signed)
-----   Message from Yetta Flock, MD sent at 08/03/2018 12:05 PM EST ----- Regarding: RE: MRI of liver CX Thanks [name], To clarify, did you mean to say the patient would NOT fit into the MRI? If that is the case, would recommend CT scan of the liver with contrast, if she has no allergy to contrast. Thanks ----- Message ----- From: Roetta Sessions, CMA Sent: 08/03/2018  11:29 AM EST To: Yetta Flock, MD Subject: MRI of liver CX                                See progress note from yesterday, Luisa Dago, radiology technician.  Nurse at [place] had confirmed that pt Would fit in the size of the bore of the MRI machine with leg contracted. :-(  Do you want me to schedule pt for RUQ U/S? Thanks, [name]

## 2018-08-03 NOTE — Telephone Encounter (Signed)
-----   Message from Yetta Flock, MD sent at 08/03/2018 12:05 PM EST ----- Regarding: RE: MRI of liver CX Thanks [name], To clarify, did you mean to say the patient would NOT fit into the MRI? If that is the case, would recommend CT scan of the liver with contrast, if she has no allergy to contrast. Thanks ----- Message ----- From: Roetta Sessions, CMA Sent: 08/03/2018  11:29 AM EST To: Yetta Flock, MD Subject: MRI of liver CX                                See progress note from yesterday, [name], radiology technician.  Nurse at [place] had confirmed that pt Would fit in the size of the bore of the MRI machine with leg contracted. :-(  Do you want me to schedule pt for RUQ U/S? Thanks, [name]

## 2018-08-03 NOTE — Telephone Encounter (Signed)
Yes, I'm sorry.  Pt would NOT fit in the MRI machine at [facility].  I have Scheduled her for a CT of the liver at [place].  I need to Call [facility] to notify Nurse scheduler of pt's appt at [facility] CT on 08-17-2018 at 2:00pm.  Patient needs instructions and 1 bottle of contrast. Instructions printed.

## 2018-08-03 NOTE — Progress Notes (Signed)
ct 

## 2018-08-04 NOTE — Telephone Encounter (Signed)
Spoke to [place] at [facility].  They will pick up the instructions and contrast for CT scan one day next week.

## 2018-08-04 NOTE — Telephone Encounter (Signed)
Lm for scheduler at [place] to call back to discuss pt's CT on 1-22 at [place].

## 2018-08-07 NOTE — Progress Notes (Signed)
Cardiology Office Note   Date:  08/09/2018   ID:  [name], DOB 10-31-1950, MRN [id number]  PCP:  Salome Arnt, [place]  Cardiologist:   Minus Breeding, MD   Chief Complaint  Patient presents with  . Cardiomyopathy      History of Present Illness: [name] is a 68 y.o. female who presents for follow up of congestive heart failure.  She was in the hospital in Dec 2018.   In 2016 she had a normal EF.  However, during the recent hospitalization she had an EF of 20%. Cath demonstrated normal coronaries.  She had thoracentesis of 1.2 liters on the left.  She went back to the nursing home where she lives but required another thoracentesis.  This one was on the right.  She had a Pleurex catheter placed.    Since I last saw her he actually has done pretty well.  She has a little exercise machine that she can pedal with her arms.  She says her breathing is been okay.  She is not describing PND or orthopnea.  She is not having any palpitations, presyncope or syncope.  She denies any chest pressure, neck or arm discomfort.   Past Medical History:  Diagnosis Date  . Acute systolic CHF (congestive heart failure) ([place]) 06/28/2017   EF was normal 2016, now 20% with grade 2 diastolic dysfunction, no significant CAD at cath  . Anemia 10/12/2008  . CKD (chronic kidney disease)   . Depression   . Diaphragmatic hernia without mention of obstruction or gangrene 08/29/2010  . Edema 05/20/2009  . Gastroparesis 10/05/2010  . GERD (gastroesophageal reflux disease) 02/02/2009  . Hx of colonic polyps   . Hypertension 10/12/2008  . Hypotension, unspecified 05/20/2009  . Immobility syndrome (paraplegic) 1977  . Iron deficiency anemia, unspecified 10/12/2008  . Leiomyoma of uterus   . Malignant neoplasm of breast (female), unspecified site 10/05/2010   2003, 2013   . Paraplegia ([place])   . Pleural effusion   . Pneumonia   . Respiratory failure ([place])   . Rheumatoid arthritis ([place])  01/17/2009    Past Surgical History:  Procedure Laterality Date  . [place]  . BREAST SURGERY  2003   Bilateral mastectomy  . IR FLUORO GUIDE CV LINE RIGHT  06/23/2017  . IR REMOVAL OF PLURAL CATH W/CUFF  02/14/2018  . IR REMOVAL TUN CV CATH W/O FL  07/15/2017  . IR THORACENTESIS ASP PLEURAL SPACE W/IMG GUIDE  07/30/2017  . IR US GUIDE VASC ACCESS RIGHT  06/23/2017  . PRESSURE ULCER DEBRIDEMENT  2009   on back  . RIGHT/LEFT HEART CATH AND CORONARY ANGIOGRAPHY N/A 07/02/2017   Procedure: RIGHT/LEFT HEART CATH AND CORONARY ANGIOGRAPHY;  Surgeon: Martinique, Peter M, MD;  Location: [facility];  Service: Cardiovascular;  Laterality: N/A;  . WOUND DEBRIDEMENT  09/02/2008   Large sacral back open wound     Current Outpatient Medications  Medication Sig Dispense Refill  . acetaminophen (TYLENOL) 325 MG tablet Take 650 mg by mouth every 6 (six) hours as needed.    . baclofen (LIORESAL) 10 MG tablet Take 10 mg by mouth 3 (three) times daily.     . Benzocaine 10 MG LOZG Use as directed 1 lozenge in the mouth or throat every 2 (two) hours as needed.    . benzonatate (TESSALON) 100 MG capsule Take by mouth 3 (three) times daily as needed for cough.    Marland Kitchen  bisacodyl (DULCOLAX) 10 MG suppository Place 1 suppository (10 mg total) rectally every Tuesday, Thursday, Saturday, and Sunday at 6 PM. 30 suppository 3  . calcium-vitamin D (OYSTER CALCIUM 500 + D) 500-200 MG-UNIT per tablet Take 1 tablet by mouth 2 (two) times daily.     . carvedilol (COREG) 12.5 MG tablet Take 1 tablet (12.5 mg total) by mouth 2 (two) times daily with a meal. 180 tablet 3  . cetirizine (ZYRTEC ALLERGY) 10 MG tablet Take 10 mg by mouth daily.     . cholecalciferol (VITAMIN D) 1000 units tablet Take 1,000 Units by mouth daily.    . Cranberry 450 MG TABS Take 450 mg by mouth daily.    . diclofenac sodium (VOLTAREN) 1 % GEL Apply 1 application topically 3 (three) times daily. To shoulders    . docusate  sodium (COLACE) 100 MG capsule Take 100 mg by mouth 3 (three) times daily.    . furosemide (LASIX) 40 MG tablet Take 40 mg by mouth 2 (two) times daily.     . hydrocortisone 2.5 % cream Apply 1 application topically every 12 (twelve) hours. To arms and legs    . hydroxychloroquine (PLAQUENIL) 200 MG tablet Take 200 mg by mouth daily.     . Ledipasvir-Sofosbuvir (HARVONI) 90-400 MG TABS Take 1 tablet by mouth daily. 28 tablet 2  . linaclotide (LINZESS) 290 MCG CAPS capsule Take 290 mcg by mouth daily before breakfast.    . losartan (COZAAR) 25 MG tablet Take 25 mg by mouth daily.    . methocarbamol (ROBAXIN) 750 MG tablet Take 750 mg by mouth 2 (two) times daily.     . mirtazapine (REMERON) 7.5 MG tablet Take 7.5 mg by mouth at bedtime.     . Multiple Vitamin (MULTIVITAMIN WITH MINERALS) TABS tablet Take 1 tablet by mouth daily.    Marland Kitchen omeprazole (PRILOSEC) 20 MG capsule Take 20 mg by mouth 2 (two) times daily.    . ondansetron (ZOFRAN) 4 MG tablet Take 4 mg by mouth every 4 (four) hours as needed for nausea or vomiting.    Marland Kitchen oxybutynin (DITROPAN) 5 MG tablet Take 5 mg by mouth 2 (two) times daily.    . polyethylene glycol (MIRALAX / GLYCOLAX) packet Take 17 g by mouth every Monday, Wednesday, Friday, and Saturday at 6 PM. 30 each 5  . sennosides-docusate sodium (SENOKOT-S) 8.6-50 MG tablet Take 2 tablets by mouth 2 (two) times daily. (Patient taking differently: Take 2 tablets by mouth 4 (four) times daily. ) 120 tablet 3  . spironolactone (ALDACTONE) 100 MG tablet Take 1 tablet (100 mg total) by mouth daily. For edema 30 tablet 2  . tiZANidine (ZANAFLEX) 4 MG tablet Take 4 mg by mouth every 6 (six) hours as needed for muscle spasms.    . traMADol (ULTRAM) 50 MG tablet Take 1 tablet (50 mg total) by mouth every 6 (six) hours as needed. 15 tablet 0  . carvedilol (COREG) 6.25 MG tablet Take 1 tablet (6.25 mg total) by mouth 2 (two) times daily. Take with 12.5 to make 18.75 twice daily. 180 tablet 3    No current facility-administered medications for this visit.     Allergies:   Patient has no known allergies.     ROS:  Please see the history of present illness.   Otherwise, review of systems are positive none.   All other systems are reviewed and negative.    PHYSICAL EXAM: BP 116/78   Pulse 88  Ht 5' 5.5" (1.664 m)   BMI 25.56 kg/m    GEN:  No distress NECK:  No jugular venous distention at 90 degrees, waveform within normal limits, carotid upstroke brisk and symmetric, no bruits, no thyromegaly LUNGS:  Clear to auscultation bilaterally CHEST:  Unremarkable HEART:  S1 and S2 within normal limits, no S3, no S4, no clicks, no rubs, no murmurs ABD:  Positive bowel sounds normal in frequency in pitch, no bruits, no rebound, no guarding, unable to assess midline mass or bruit with the patient seated. EXT:  2 plus pulses throughout, trace edema, no cyanosis no clubbing  EKG:  EKG is not ordered today.   Recent Labs: 07/18/2018: ALT 17; BUN 16; Creat 0.58; Hemoglobin 12.2; Platelets 250; Potassium 4.3; Sodium 137    Lipid Panel    Component Value Date/Time   CHOL 130 07/02/2017 0430   TRIG 102 07/02/2017 0430   HDL 33 (L) 07/02/2017 0430   CHOLHDL 3.9 07/02/2017 0430   VLDL 20 07/02/2017 0430   LDLCALC 77 07/02/2017 0430      Wt Readings from Last 3 Encounters:  07/18/18 156 lb (70.8 kg)  04/04/18 146 lb (66.2 kg)  02/14/18 150 lb 2.1 oz (68.1 kg)      Other studies Reviewed: Additional studies/ records that were reviewed today include:   None    ASSESSMENT AND PLAN:   Pleural effusion I do not suspect any recurrence of this.  I am delighted that she is done so well.  The catheter was removed in July.  No change in therapy.   NICM (nonischemic cardiomyopathy) ([place]) She did not have CAD.   EF 20-25% by echo Dec 2018.   At the last visit I increased her beta blocker.  She tolerated this and I am going to go to Coreg 18.75 mg twice daily.  Try to go up on  the ACE inhibitor in the future as well.  Once I get to maximal medical therapy I will repeat an echocardiogram.ardiogram.   Cough She says this is improved.  No change in therapy.    Current medicines are reviewed at length with the patient today.  The patient does not have concerns regarding medicines.  The following changes have been made:   As above.    Labs/ tests ordered today include:   None   Disposition:   FU with me in 2 months.     Signed, Minus Breeding, MD  08/09/2018 2:26 PM    [facility]

## 2018-08-09 ENCOUNTER — Encounter (INDEPENDENT_AMBULATORY_CARE_PROVIDER_SITE_OTHER): Payer: Self-pay

## 2018-08-09 ENCOUNTER — Ambulatory Visit (INDEPENDENT_AMBULATORY_CARE_PROVIDER_SITE_OTHER): Payer: Medicare Other | Admitting: Cardiology

## 2018-08-09 ENCOUNTER — Encounter: Payer: Self-pay | Admitting: Cardiology

## 2018-08-09 VITALS — BP 116/78 | HR 88 | Ht 65.5 in

## 2018-08-09 DIAGNOSIS — R05 Cough: Secondary | ICD-10-CM

## 2018-08-09 DIAGNOSIS — R059 Cough, unspecified: Secondary | ICD-10-CM

## 2018-08-09 DIAGNOSIS — I420 Dilated cardiomyopathy: Secondary | ICD-10-CM

## 2018-08-09 MED ORDER — CARVEDILOL 6.25 MG PO TABS: 6.2500 mg | ORAL_TABLET | Freq: Two times a day (BID) | ORAL | 3 refills | Status: DC

## 2018-08-09 NOTE — Patient Instructions (Signed)
Medication Instructions:  INCREASE COREG TO 1 AND A HALF TABLETS TWICE DAILY.  If you need a refill on your cardiac medications before your next appointment, please call your pharmacy.  Labwork: NONE   Take the provided lab slips with you to the lab for your blood draw.  When you have your labs (blood work) drawn today and your tests are completely normal, you will receive your results only by MyChart Message (if you have MyChart) -OR-  A paper copy in the mail.  If you have any lab test that is abnormal or we need to change your treatment, we will call you to review these results.  Testing/Procedures: NONE   Follow-Up: You will need a follow up appointment in 2 months.  Please call our office 2 months in advance to schedule this appointment.  You may see Minus Breeding, MD or one of the following Advanced Practice Providers on your designated Care Team:   Rosaria Ferries, PA-C . Jory Sims, DNP, ANP   At [facility], you and your health needs are our priority.  As part of our continuing mission to provide you with exceptional heart care, we have created designated Provider Care Teams.  These Care Teams include your primary Cardiologist (physician) and Advanced Practice Providers (APPs -  Physician Assistants and Nurse Practitioners) who all work together to provide you with the care you need, when you need it.  Thank you for choosing [facility] at [facility]!!

## 2018-08-17 ENCOUNTER — Inpatient Hospital Stay: Admission: RE | Admit: 2018-08-17 | Payer: Medicare Other | Source: Ambulatory Visit

## 2018-08-24 ENCOUNTER — Telehealth: Payer: Self-pay

## 2018-08-24 DIAGNOSIS — Z1211 Encounter for screening for malignant neoplasm of colon: Secondary | ICD-10-CM

## 2018-08-24 NOTE — Telephone Encounter (Signed)
LM for director of Nursing that we have still not rec'd the results of the Cologuard test that was ordered in October 2019. And asked her to call me back for a status update.

## 2018-09-07 ENCOUNTER — Other Ambulatory Visit: Payer: Self-pay | Admitting: *Deleted

## 2018-09-07 ENCOUNTER — Ambulatory Visit (INDEPENDENT_AMBULATORY_CARE_PROVIDER_SITE_OTHER)
Admission: RE | Admit: 2018-09-07 | Discharge: 2018-09-07 | Disposition: A | Discharge status: Home / Self Care | Payer: Medicare Other | Source: Ambulatory Visit | Attending: Gastroenterology | Admitting: Gastroenterology

## 2018-09-07 ENCOUNTER — Other Ambulatory Visit: Payer: Medicare Other | Admitting: *Deleted

## 2018-09-07 ENCOUNTER — Other Ambulatory Visit: Payer: Self-pay | Admitting: Cardiology

## 2018-09-07 DIAGNOSIS — Z79899 Other long term (current) drug therapy: Secondary | ICD-10-CM

## 2018-09-07 DIAGNOSIS — B182 Chronic viral hepatitis C: Secondary | ICD-10-CM | POA: Diagnosis not present

## 2018-09-07 DIAGNOSIS — K7460 Unspecified cirrhosis of liver: Secondary | ICD-10-CM

## 2018-09-07 LAB — CREATININE, SERUM
CREATININE: 0.53 mg/dL — AB (ref 0.57–1.00)
GFR calc Af Amer: 113 mL/min/{1.73_m2} (ref >59)
GFR, EST NON AFRICAN AMERICAN: 98 mL/min/{1.73_m2} (ref >59)

## 2018-09-07 MED ORDER — IOPAMIDOL (ISOVUE-300) INJECTION 61%
100.0000 mL | Freq: Once | INTRAVENOUS | Status: AC | PRN
  Administered 2018-09-07: 100 mL via INTRAVENOUS

## 2018-09-19 ENCOUNTER — Other Ambulatory Visit: Payer: Self-pay

## 2018-09-19 NOTE — Telephone Encounter (Signed)
Called [facility] again and spoke to Asst Director of Nursing, [place]. Explained that we have been trying to get the facility to collect and mail a sample for a Cologuard test since October of 2019. She indicates that the pt is unable to sit on the toilet to use the collection device which is required by [employer].  Called and spoke to [employer] and they confirmed the sample must be collected directly into the collection device; it can NOT be transferred into it from any other material.

## 2018-09-19 NOTE — Telephone Encounter (Signed)
Thanks [name]. Sorry to hear this. If the Cologuard cannot be processed correctly, we can try ordering a FIT stool test for her screening, if you can help switch that. Thanks

## 2018-09-19 NOTE — Progress Notes (Signed)
error 

## 2018-09-19 NOTE — Addendum Note (Signed)
Addended by: Roetta Sessions on: 09/19/2018 04:24 PM   Modules accepted: Orders

## 2018-09-19 NOTE — Telephone Encounter (Signed)
Spoke to Nurse,Tee at [employer].  I am faxing her the lab order for the ifob, FIT test. Asked that the results be faxed back to Dr. Havery Moros. She expressed understanding

## 2018-09-21 NOTE — Telephone Encounter (Signed)
LM for [name], Director of Nursing to call me regarding the lab order I faxed over to her for pt.  Asked her to call me back and give me an update on the collection and processing of the ifob stool test we are doing instead of Cologuard

## 2018-09-22 NOTE — Telephone Encounter (Signed)
Left another message for Tee, asst Director of [place] at [facility] to call back regarding ifob lab ordered for pt.

## 2018-09-23 ENCOUNTER — Telehealth: Payer: Self-pay | Admitting: Pulmonary Disease

## 2018-09-23 NOTE — Telephone Encounter (Signed)
Spoke with [facility].  Relayed pt had pneumovax 23, flu, hep A and B on 04/20/18.  Nothing further needed.

## 2018-09-26 NOTE — Telephone Encounter (Signed)
Called and spoke to [place], Asst Science writer.  She said they did not receive the order for IFOB test.  She indicated she will put in an order and take care of it personally.

## 2018-09-26 NOTE — Telephone Encounter (Signed)
Called and spoke to [facility]. Confirmed correct Immunochemical fecal occult blood test.

## 2018-09-26 NOTE — Telephone Encounter (Signed)
Pls call [name], she wants to verified that she ordered the right test. Her phone is [phone].

## 2018-09-28 ENCOUNTER — Other Ambulatory Visit: Payer: Self-pay

## 2018-09-28 LAB — COLOGUARD: Cologuard: NEGATIVE

## 2018-10-05 ENCOUNTER — Other Ambulatory Visit: Payer: Self-pay

## 2018-10-05 NOTE — Patient Outreach (Signed)
[facility]) Care Management  10/05/2018  ALEXYSS BALZARINI [birth year] [id number]   Medication Adherence call to Mrs. Peggye Poon left a message for patient to call back patient is due on Losartan 25 mg. Mrs. Deakins is showin due under [place].   [place] Management Direct Dial [phone]  Fax [phone] [email or web address]

## 2018-10-06 NOTE — Progress Notes (Deleted)
Cardiology Office Note    Date:  10/06/2018   ID:  [name], DOB Oct 23, 1950, MRN [id number]  PCP:  [name], [place]  Cardiologist:   [name] Breeding, MD   No chief complaint on file.     History of Present Illness: [name] is a 68 y.o. female who presents for follow up of congestive heart failure.  She was in the hospital in Dec 2018.   In 2016 she had a normal EF.  However, during the recent hospitalization she had an EF of 20%. Cath demonstrated normal coronaries.  She had thoracentesis of 1.2 liters on the left.  She went back to the nursing home where she lives but required another thoracentesis.  This one was on the right.  She had a Pleurex catheter placed.  At the last visit I titrated the beta blocker.  Since I last saw her ***  Since I last saw her he actually has done pretty well.  She has a little exercise machine that she can pedal with her arms.  She says her breathing is been okay.  She is not describing PND or orthopnea.  She is not having any palpitations, presyncope or syncope.  She denies any chest pressure, neck or arm discomfort.   Past Medical History:  Diagnosis Date  . Acute systolic CHF (congestive heart failure) ([place]) 06/28/2017   EF was normal 2016, now 20% with grade 2 diastolic dysfunction, no significant CAD at cath  . Anemia 10/12/2008  . CKD (chronic kidney disease)   . Depression   . Diaphragmatic hernia without mention of obstruction or gangrene 08/29/2010  . Edema 05/20/2009  . Gastroparesis 10/05/2010  . GERD (gastroesophageal reflux disease) 02/02/2009  . Hx of colonic polyps   . Hypertension 10/12/2008  . Hypotension, unspecified 05/20/2009  . Immobility syndrome (paraplegic) 1977  . Iron deficiency anemia, unspecified 10/12/2008  . Leiomyoma of uterus   . Malignant neoplasm of breast (female), unspecified site 10/05/2010   2003, 2013   . Paraplegia ([place])   . Pleural effusion   . Pneumonia   . Respiratory failure  ([place])   . Rheumatoid arthritis ([place]) 01/17/2009    Past Surgical History:  Procedure Laterality Date  . [place]  . BREAST SURGERY  2003   Bilateral mastectomy  . IR FLUORO GUIDE CV LINE RIGHT  06/23/2017  . IR REMOVAL OF PLURAL CATH W/CUFF  02/14/2018  . IR REMOVAL TUN CV CATH W/O FL  07/15/2017  . IR THORACENTESIS ASP PLEURAL SPACE W/IMG GUIDE  07/30/2017  . IR US GUIDE VASC ACCESS RIGHT  06/23/2017  . PRESSURE ULCER DEBRIDEMENT  2009   on back  . RIGHT/LEFT HEART CATH AND CORONARY ANGIOGRAPHY N/A 07/02/2017   Procedure: RIGHT/LEFT HEART CATH AND CORONARY ANGIOGRAPHY;  Surgeon: Martinique, Peter M, MD;  Location: [facility];  Service: Cardiovascular;  Laterality: N/A;  . WOUND DEBRIDEMENT  09/02/2008   Large sacral back open wound     Current Outpatient Medications  Medication Sig Dispense Refill  . acetaminophen (TYLENOL) 325 MG tablet Take 650 mg by mouth every 6 (six) hours as needed.    . baclofen (LIORESAL) 10 MG tablet Take 10 mg by mouth 3 (three) times daily.     . Benzocaine 10 MG LOZG Use as directed 1 lozenge in the mouth or throat every 2 (two) hours as needed.    . benzonatate (TESSALON) 100 MG capsule  Take by mouth 3 (three) times daily as needed for cough.    . bisacodyl (DULCOLAX) 10 MG suppository Place 1 suppository (10 mg total) rectally every Tuesday, Thursday, Saturday, and Sunday at 6 PM. 30 suppository 3  . calcium-vitamin D (OYSTER CALCIUM 500 + D) 500-200 MG-UNIT per tablet Take 1 tablet by mouth 2 (two) times daily.     . carvedilol (COREG) 12.5 MG tablet Take 1 tablet (12.5 mg total) by mouth 2 (two) times daily with a meal. 180 tablet 3  . carvedilol (COREG) 6.25 MG tablet Take 1 tablet (6.25 mg total) by mouth 2 (two) times daily. Take with 12.5 to make 18.75 twice daily. 180 tablet 3  . cetirizine (ZYRTEC ALLERGY) 10 MG tablet Take 10 mg by mouth daily.     . cholecalciferol (VITAMIN D) 1000 units tablet Take 1,000 Units by  mouth daily.    . Cranberry 450 MG TABS Take 450 mg by mouth daily.    . diclofenac sodium (VOLTAREN) 1 % GEL Apply 1 application topically 3 (three) times daily. To shoulders    . docusate sodium (COLACE) 100 MG capsule Take 100 mg by mouth 3 (three) times daily.    . furosemide (LASIX) 40 MG tablet Take 40 mg by mouth 2 (two) times daily.     . hydrocortisone 2.5 % cream Apply 1 application topically every 12 (twelve) hours. To arms and legs    . hydroxychloroquine (PLAQUENIL) 200 MG tablet Take 200 mg by mouth daily.     . Ledipasvir-Sofosbuvir (HARVONI) 90-400 MG TABS Take 1 tablet by mouth daily. 28 tablet 2  . linaclotide (LINZESS) 290 MCG CAPS capsule Take 290 mcg by mouth daily before breakfast.    . losartan (COZAAR) 25 MG tablet Take 25 mg by mouth daily.    . methocarbamol (ROBAXIN) 750 MG tablet Take 750 mg by mouth 2 (two) times daily.     . mirtazapine (REMERON) 7.5 MG tablet Take 7.5 mg by mouth at bedtime.     . Multiple Vitamin (MULTIVITAMIN WITH MINERALS) TABS tablet Take 1 tablet by mouth daily.    Marland Kitchen omeprazole (PRILOSEC) 20 MG capsule Take 20 mg by mouth 2 (two) times daily.    . ondansetron (ZOFRAN) 4 MG tablet Take 4 mg by mouth every 4 (four) hours as needed for nausea or vomiting.    Marland Kitchen oxybutynin (DITROPAN) 5 MG tablet Take 5 mg by mouth 2 (two) times daily.    . polyethylene glycol (MIRALAX / GLYCOLAX) packet Take 17 g by mouth every Monday, Wednesday, Friday, and Saturday at 6 PM. 30 each 5  . sennosides-docusate sodium (SENOKOT-S) 8.6-50 MG tablet Take 2 tablets by mouth 2 (two) times daily. (Patient taking differently: Take 2 tablets by mouth 4 (four) times daily. ) 120 tablet 3  . spironolactone (ALDACTONE) 100 MG tablet Take 1 tablet (100 mg total) by mouth daily. For edema 30 tablet 2  . tiZANidine (ZANAFLEX) 4 MG tablet Take 4 mg by mouth every 6 (six) hours as needed for muscle spasms.    . traMADol (ULTRAM) 50 MG tablet Take 1 tablet (50 mg total) by mouth every  6 (six) hours as needed. 15 tablet 0   No current facility-administered medications for this visit.     Allergies:   Patient has no known allergies.     ROS:  Please see the history of present illness.   Otherwise, review of systems are positive ***.   All other systems are reviewed  and negative.    PHYSICAL EXAM: There were no vitals taken for this visit.   GENERAL:  Well appearing NECK:  No jugular venous distention, waveform within normal limits, carotid upstroke brisk and symmetric, no bruits, no thyromegaly LUNGS:  Clear to auscultation bilaterally CHEST:  Unremarkable HEART:  PMI not displaced or sustained,S1 and S2 within normal limits, no S3, no S4, no clicks, no rubs, *** murmurs ABD:  Flat, positive bowel sounds normal in frequency in pitch, no bruits, no rebound, no guarding, no midline pulsatile mass, no hepatomegaly, no splenomegaly EXT:  2 plus pulses throughout, no edema, no cyanosis no clubbing    ***GEN:  No distress NECK:  No jugular venous distention at 90 degrees, waveform within normal limits, carotid upstroke brisk and symmetric, no bruits, no thyromegaly LUNGS:  Clear to auscultation bilaterally CHEST:  Unremarkable HEART:  S1 and S2 within normal limits, no S3, no S4, no clicks, no rubs, no murmurs ABD:  Positive bowel sounds normal in frequency in pitch, no bruits, no rebound, no guarding, unable to assess midline mass or bruit with the patient seated. EXT:  2 plus pulses throughout, trace edema, no cyanosis no clubbing   EKG:  EKG is *** ordered today. ***  Recent Labs: 07/18/2018: ALT 17; BUN 16; Hemoglobin 12.2; Platelets 250; Potassium 4.3; Sodium 137 09/07/2018: Creatinine, Ser 0.53    Lipid Panel    Component Value Date/Time   CHOL 130 07/02/2017 0430   TRIG 102 07/02/2017 0430   HDL 33 (L) 07/02/2017 0430   CHOLHDL 3.9 07/02/2017 0430   VLDL 20 07/02/2017 0430   LDLCALC 77 07/02/2017 0430      Wt Readings from Last 3 Encounters:   07/18/18 156 lb (70.8 kg)  04/04/18 146 lb (66.2 kg)  02/14/18 150 lb 2.1 oz (68.1 kg)      Other studies Reviewed: Additional studies/ records that were reviewed today include:   ***    ASSESSMENT AND PLAN:   Pleural effusion ***   I do not suspect any recurrence of this.  I am delighted that she is done so well.  The catheter was removed in July.  No change in therapy.   NICM (nonischemic cardiomyopathy) (H[facility]) ***   She did not have CAD.   EF 20-25% by echo Dec 2018.   At the last visit I increased her beta blocker.  She tolerated this and I am going to go to Coreg 18.75 mg twice daily.  Try to go up on the ACE inhibitor in the future as well.  Once I get to maximal medical therapy I will repeat an echocardiogram.ardiogram.   Cough ***   She says this is improved.  No change in therapy.    Current medicines are reviewed at length with the patient today.  The patient does not have concerns regarding medicines.  The following changes have been made:   ***  Labs/ tests ordered today include:   ***   Disposition:   FU with me in *** months.     Signed, [name] Breeding, MD  10/06/2018 8:54 PM    [facility]

## 2018-10-07 ENCOUNTER — Ambulatory Visit: Payer: Medicare Other | Admitting: Cardiology

## 2018-10-10 ENCOUNTER — Encounter: Payer: Self-pay | Admitting: *Deleted

## 2018-10-26 ENCOUNTER — Telehealth: Payer: Self-pay | Admitting: Gastroenterology

## 2018-10-26 NOTE — Telephone Encounter (Signed)
Spoke to [employer] NP at [facility], gave results of Cologard

## 2018-10-26 NOTE — Telephone Encounter (Signed)
[name] call from [employer] but stated that she takes care of pt through [facility] is wanting to speak with the nurse about pt cologard results.

## 2018-11-29 ENCOUNTER — Encounter: Payer: Self-pay | Admitting: Gastroenterology

## 2019-02-27 ENCOUNTER — Telehealth: Payer: Self-pay

## 2019-02-27 DIAGNOSIS — K760 Fatty (change of) liver, not elsewhere classified: Secondary | ICD-10-CM

## 2019-02-27 DIAGNOSIS — B182 Chronic viral hepatitis C: Secondary | ICD-10-CM

## 2019-02-27 DIAGNOSIS — K7460 Unspecified cirrhosis of liver: Secondary | ICD-10-CM

## 2019-02-27 NOTE — Telephone Encounter (Signed)
Pt needs U/S of liver for cirrhosis and an OV with Dr. Havery Moros.  She lives at [facility]: [phone]

## 2019-02-27 NOTE — Telephone Encounter (Signed)
-----   Message from Larina Bras, [place] sent at 09/09/2018  4:54 PM EST ----- Needs u/s around 02/2019 (see imaging results from 09/09/18)

## 2019-02-28 NOTE — Telephone Encounter (Signed)
Correct number: [phone]. Called and spoke to nurse, Roselyn Reef.  He took down all the information and said they will have the pt there at 9:45am on Tuesday, 8-18 at [facility].

## 2019-02-28 NOTE — Telephone Encounter (Signed)
Gery Pray @ [facility] returning your call [phone]

## 2019-02-28 NOTE — Telephone Encounter (Signed)
LM for [facility] to call back to discuss getting pt to her U/S for Dr. Havery Moros scheduled for Tuesday, August 18th at [facility] at 10:00am to arrive at 9:45am. NPO 6 hours prior.

## 2019-03-14 ENCOUNTER — Other Ambulatory Visit: Payer: Self-pay

## 2019-03-14 ENCOUNTER — Ambulatory Visit (HOSPITAL_COMMUNITY)
Admission: RE | Admit: 2019-03-14 | Discharge: 2019-03-14 | Disposition: A | Discharge status: Home / Self Care | Payer: Medicare Other | Source: Ambulatory Visit | Attending: Gastroenterology | Admitting: Gastroenterology

## 2019-03-14 DIAGNOSIS — R772 Abnormality of alphafetoprotein: Secondary | ICD-10-CM

## 2019-03-14 DIAGNOSIS — K7460 Unspecified cirrhosis of liver: Secondary | ICD-10-CM | POA: Insufficient documentation

## 2019-03-14 DIAGNOSIS — B182 Chronic viral hepatitis C: Secondary | ICD-10-CM | POA: Diagnosis present

## 2019-03-14 DIAGNOSIS — K760 Fatty (change of) liver, not elsewhere classified: Secondary | ICD-10-CM | POA: Diagnosis present

## 2019-04-24 ENCOUNTER — Ambulatory Visit (INDEPENDENT_AMBULATORY_CARE_PROVIDER_SITE_OTHER): Payer: Medicare Other | Admitting: Gastroenterology

## 2019-04-24 ENCOUNTER — Encounter: Payer: Self-pay | Admitting: Gastroenterology

## 2019-04-24 ENCOUNTER — Other Ambulatory Visit (INDEPENDENT_AMBULATORY_CARE_PROVIDER_SITE_OTHER): Payer: Medicare Other

## 2019-04-24 VITALS — BP 88/60 | HR 78 | Temp 98.2°F

## 2019-04-24 DIAGNOSIS — B1920 Unspecified viral hepatitis C without hepatic coma: Secondary | ICD-10-CM

## 2019-04-24 DIAGNOSIS — K219 Gastro-esophageal reflux disease without esophagitis: Secondary | ICD-10-CM

## 2019-04-24 DIAGNOSIS — K5900 Constipation, unspecified: Secondary | ICD-10-CM

## 2019-04-24 DIAGNOSIS — Z23 Encounter for immunization: Secondary | ICD-10-CM | POA: Diagnosis not present

## 2019-04-24 DIAGNOSIS — K7460 Unspecified cirrhosis of liver: Secondary | ICD-10-CM | POA: Diagnosis not present

## 2019-04-24 LAB — CBC WITH DIFFERENTIAL/PLATELET
Basophils Absolute: 0.1 10*3/uL (ref 0.0–0.1)
Basophils Relative: 1.1 % (ref 0.0–3.0)
Eosinophils Absolute: 0.2 10*3/uL (ref 0.0–0.7)
Eosinophils Relative: 2.8 % (ref 0.0–5.0)
HCT: 41.1 % (ref 36.0–46.0)
Hemoglobin: 13.3 g/dL (ref 12.0–15.0)
Lymphocytes Relative: 26.4 % (ref 12.0–46.0)
Lymphs Abs: 1.7 10*3/uL (ref 0.7–4.0)
MCHC: 32.3 g/dL (ref 30.0–36.0)
MCV: 79.3 fl (ref 78.0–100.0)
Monocytes Absolute: 0.4 10*3/uL (ref 0.1–1.0)
Monocytes Relative: 6.6 % (ref 3.0–12.0)
Neutro Abs: 4.1 10*3/uL (ref 1.4–7.7)
Neutrophils Relative %: 63.1 % (ref 43.0–77.0)
Platelets: 257 10*3/uL (ref 150.0–400.0)
RBC: 5.19 Mil/uL — ABNORMAL HIGH (ref 3.87–5.11)
RDW: 16.5 % — ABNORMAL HIGH (ref 11.5–15.5)
WBC: 6.5 10*3/uL (ref 4.0–10.5)

## 2019-04-24 LAB — COMPREHENSIVE METABOLIC PANEL
ALT: 26 U/L (ref 0–35)
AST: 15 U/L (ref 0–37)
Albumin: 4.3 g/dL (ref 3.5–5.2)
Alkaline Phosphatase: 61 U/L (ref 39–117)
BUN: 22 mg/dL (ref 6–23)
CO2: 27 mEq/L (ref 19–32)
Calcium: 10.5 mg/dL (ref 8.4–10.5)
Chloride: 98 mEq/L (ref 96–112)
Creatinine, Ser: 0.79 mg/dL (ref 0.40–1.20)
GFR: 87.38 mL/min (ref >60.00)
Glucose, Bld: 90 mg/dL (ref 70–99)
Potassium: 3.9 mEq/L (ref 3.5–5.1)
Sodium: 135 mEq/L (ref 135–145)
Total Bilirubin: 0.3 mg/dL (ref 0.2–1.2)
Total Protein: 8.8 g/dL — ABNORMAL HIGH (ref 6.0–8.3)

## 2019-04-24 LAB — PROTIME-INR
INR: 1.1 ratio — ABNORMAL HIGH (ref 0.8–1.0)
Prothrombin Time: 13.2 s — ABNORMAL HIGH (ref 9.6–13.1)
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
## 2019-04-24 NOTE — Patient Instructions (Addendum)
If you are age 68 or older, your body mass index should be between 23-30. Your There is no height or weight on file to calculate BMI. If this is out of the aforementioned range listed, please consider follow up with your Primary Care Provider.  If you are age 67 or younger, your body mass index should be between 19-25. Your There is no height or weight on file to calculate BMI. If this is out of the aformentioned range listed, please consider follow up with your Primary Care Provider.   To help prevent the possible spread of infection to our patients, communities, and staff; we will be implementing the following measures:  As of now we are not allowing any visitors/family members to accompany you to any upcoming appointments with [facility]. If you have any concerns about this please contact our office to discuss prior to the appointment.   Please go to the lab in the basement of our building to have lab work done as you leave today. Hit "B" for basement when you get on the elevator.  When the doors open the lab is on your left.  We will call you with the results. Thank you.  Please take your omeprazole (Prilosec) twice a day.  Begin Carafate suspension:  Take 35mls every 6 hours as needed  We are giving you a flu shot today.   Thank you for entrusting me with your care and for choosing [facility], Dr. Derry Cellar

## 2019-04-26 LAB — HEPATITIS C RNA QUANTITATIVE
HCV Quantitative Log: <1.18 Log IU/mL
HCV RNA, PCR, QN: <15 IU/mL

## 2019-04-26 LAB — AFP TUMOR MARKER: AFP-Tumor Marker: 11 ng/mL — ABNORMAL HIGH

## 2019-07-18 IMAGING — DX DG CHEST 1V
1 series · 1 of 1 positions shown · non-contrast
Comparison: 06/30/2017 chest radiograph.

CLINICAL DATA: Status post right thoracentesis

EXAM:
CHEST 1 VIEW

[chest ap]
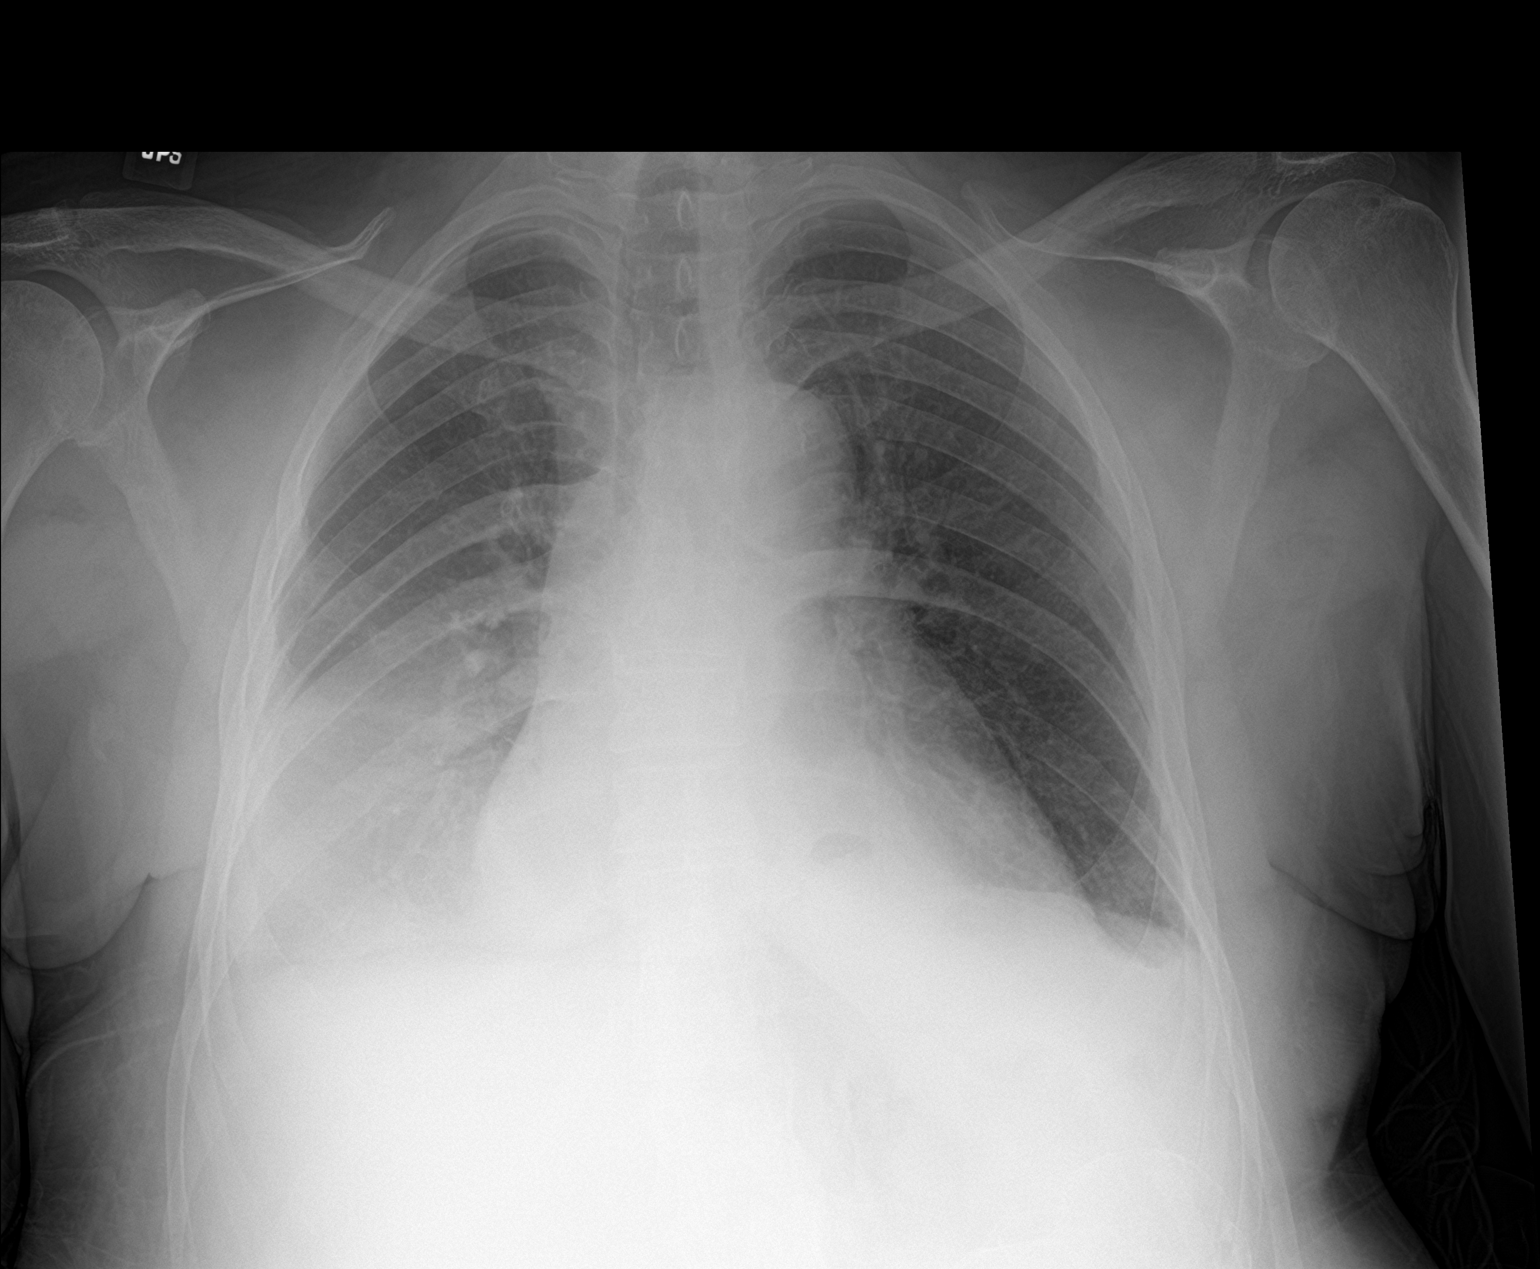

[1 of 1 positions shown; findings below may reference images not displayed]

FINDINGS: Interval removal of the right internal jugular MediPort. Stable
cardiomediastinal silhouette with mild cardiomegaly and small hiatal
hernia. No pneumothorax. Residual small right pleural effusion.
Trace left pleural effusion. No pulmonary edema. Hazy bibasilar lung
opacities, right greater the left.
IMPRESSION: 1. No pneumothorax.
2. Residual small right pleural effusion.
3. Trace left pleural effusion.
4. Hazy bibasilar lung opacities, right greater than left, probably
atelectasis.
5. Small hiatal hernia.
6. Mild cardiomegaly without pulmonary edema.

## 2019-07-28 DIAGNOSIS — Z8616 Personal history of COVID-19: Secondary | ICD-10-CM

## 2019-07-28 HISTORY — DX: Personal history of COVID-19: Z86.16

## 2020-06-13 ENCOUNTER — Other Ambulatory Visit: Payer: Self-pay

## 2020-06-13 ENCOUNTER — Emergency Department (HOSPITAL_COMMUNITY): Payer: Medicare Other

## 2020-06-13 ENCOUNTER — Telehealth: Payer: Self-pay | Admitting: Gastroenterology

## 2020-06-13 ENCOUNTER — Encounter (HOSPITAL_COMMUNITY): Payer: Self-pay

## 2020-06-13 ENCOUNTER — Emergency Department (HOSPITAL_COMMUNITY)
Admission: EM | Admit: 2020-06-13 | Discharge: 2020-06-13 | Disposition: A | Discharge status: Home / Self Care | Payer: Medicare Other | Attending: Emergency Medicine | Admitting: Emergency Medicine

## 2020-06-13 DIAGNOSIS — Z79899 Other long term (current) drug therapy: Secondary | ICD-10-CM | POA: Diagnosis not present

## 2020-06-13 DIAGNOSIS — Z87891 Personal history of nicotine dependence: Secondary | ICD-10-CM | POA: Diagnosis not present

## 2020-06-13 DIAGNOSIS — R531 Weakness: Secondary | ICD-10-CM | POA: Insufficient documentation

## 2020-06-13 DIAGNOSIS — N189 Chronic kidney disease, unspecified: Secondary | ICD-10-CM | POA: Insufficient documentation

## 2020-06-13 DIAGNOSIS — Z853 Personal history of malignant neoplasm of breast: Secondary | ICD-10-CM | POA: Diagnosis not present

## 2020-06-13 DIAGNOSIS — K769 Liver disease, unspecified: Secondary | ICD-10-CM

## 2020-06-13 DIAGNOSIS — Z20822 Contact with and (suspected) exposure to covid-19: Secondary | ICD-10-CM | POA: Insufficient documentation

## 2020-06-13 DIAGNOSIS — K219 Gastro-esophageal reflux disease without esophagitis: Secondary | ICD-10-CM | POA: Insufficient documentation

## 2020-06-13 DIAGNOSIS — R1084 Generalized abdominal pain: Secondary | ICD-10-CM | POA: Diagnosis not present

## 2020-06-13 DIAGNOSIS — I5021 Acute systolic (congestive) heart failure: Secondary | ICD-10-CM | POA: Diagnosis not present

## 2020-06-13 DIAGNOSIS — I130 Hypertensive heart and chronic kidney disease with heart failure and stage 1 through stage 4 chronic kidney disease, or unspecified chronic kidney disease: Secondary | ICD-10-CM | POA: Diagnosis not present

## 2020-06-13 DIAGNOSIS — N390 Urinary tract infection, site not specified: Secondary | ICD-10-CM

## 2020-06-13 LAB — PROTIME-INR
INR: 1.1 (ref 0.8–1.2)
Prothrombin Time: 13.6 seconds (ref 11.4–15.2)

## 2020-06-13 LAB — CBC WITH DIFFERENTIAL/PLATELET
Abs Immature Granulocytes: 0.05 10*3/uL (ref 0.00–0.07)
Basophils Absolute: 0 10*3/uL (ref 0.0–0.1)
Basophils Relative: 0 %
Eosinophils Absolute: 0.1 10*3/uL (ref 0.0–0.5)
Eosinophils Relative: 1 %
HCT: 44.7 % (ref 36.0–46.0)
Hemoglobin: 14.4 g/dL (ref 12.0–15.0)
Immature Granulocytes: 1 %
Lymphocytes Relative: 19 %
Lymphs Abs: 1.9 10*3/uL (ref 0.7–4.0)
MCH: 27.1 pg (ref 26.0–34.0)
MCHC: 32.2 g/dL (ref 30.0–36.0)
MCV: 84 fL (ref 80.0–100.0)
Monocytes Absolute: 0.5 10*3/uL (ref 0.1–1.0)
Monocytes Relative: 5 %
Neutro Abs: 7.4 10*3/uL (ref 1.7–7.7)
Neutrophils Relative %: 74 %
Platelets: 230 10*3/uL (ref 150–400)
RBC: 5.32 MIL/uL — ABNORMAL HIGH (ref 3.87–5.11)
RDW: 14.1 % (ref 11.5–15.5)
WBC: 10 10*3/uL (ref 4.0–10.5)
nRBC: 0 % (ref 0.0–0.2)

## 2020-06-13 LAB — COMPREHENSIVE METABOLIC PANEL
ALT: 45 U/L — ABNORMAL HIGH (ref 0–44)
AST: 31 U/L (ref 15–41)
Albumin: 3.6 g/dL (ref 3.5–5.0)
Alkaline Phosphatase: 52 U/L (ref 38–126)
Anion gap: 12 (ref 5–15)
BUN: 13 mg/dL (ref 8–23)
CO2: 25 mmol/L (ref 22–32)
Calcium: 9.4 mg/dL (ref 8.9–10.3)
Chloride: 100 mmol/L (ref 98–111)
Creatinine, Ser: 0.8 mg/dL (ref 0.44–1.00)
GFR, Estimated: >60 mL/min (ref >60)
Glucose, Bld: 102 mg/dL — ABNORMAL HIGH (ref 70–99)
Potassium: 3.5 mmol/L (ref 3.5–5.1)
Sodium: 137 mmol/L (ref 135–145)
Total Bilirubin: 0.6 mg/dL (ref 0.3–1.2)
Total Protein: 8 g/dL (ref 6.5–8.1)

## 2020-06-13 LAB — TSH: TSH: 2.886 u[IU]/mL (ref 0.350–4.500)

## 2020-06-13 LAB — URINALYSIS, ROUTINE W REFLEX MICROSCOPIC
Bilirubin Urine: NEGATIVE
Glucose, UA: NEGATIVE mg/dL
Hgb urine dipstick: NEGATIVE
Ketones, ur: NEGATIVE mg/dL
Nitrite: POSITIVE — AB
Protein, ur: NEGATIVE mg/dL
Specific Gravity, Urine: 1.019 (ref 1.005–1.030)
pH: 5 (ref 5.0–8.0)

## 2020-06-13 LAB — CK: Total CK: 86 U/L (ref 38–234)

## 2020-06-13 LAB — LIPASE, BLOOD: Lipase: 25 U/L (ref 11–51)

## 2020-06-13 LAB — LACTIC ACID, PLASMA: Lactic Acid, Venous: 1.7 mmol/L (ref 0.5–1.9)

## 2020-06-13 LAB — RESPIRATORY PANEL BY RT PCR (FLU A&B, COVID)
Influenza A by PCR: NEGATIVE
Influenza B by PCR: NEGATIVE
SARS Coronavirus 2 by RT PCR: NEGATIVE

## 2020-06-13 LAB — TROPONIN I (HIGH SENSITIVITY): Troponin I (High Sensitivity): 5 ng/L (ref <18)

## 2020-06-13 MED ORDER — SODIUM CHLORIDE 0.9 % IV BOLUS
500.0000 mL | Freq: Once | INTRAVENOUS | Status: AC
  Administered 2020-06-13: 500 mL via INTRAVENOUS

## 2020-06-13 MED ORDER — OXYCODONE-ACETAMINOPHEN 5-325 MG PO TABS
1.0000 | ORAL_TABLET | Freq: Once | ORAL | Status: AC
  Administered 2020-06-13: 1 via ORAL
  Filled 2020-06-13: qty 1

## 2020-06-13 MED ORDER — SODIUM CHLORIDE 0.9 % IV SOLN
1.0000 g | Freq: Once | INTRAVENOUS | Status: AC
  Administered 2020-06-13: 1 g via INTRAVENOUS
  Filled 2020-06-13: qty 10

## 2020-06-13 MED ORDER — CEPHALEXIN 500 MG PO CAPS: 500.0000 mg | ORAL_CAPSULE | Freq: Four times a day (QID) | ORAL | 0 refills | Status: AC

## 2020-06-13 MED ORDER — IOHEXOL 300 MG/ML  SOLN
100.0000 mL | Freq: Once | INTRAMUSCULAR | Status: AC | PRN
  Administered 2020-06-13: 100 mL via INTRAVENOUS

## 2020-06-13 NOTE — ED Notes (Signed)
Pt denies any abdominal pain,nausea, or vomiting after drinking water.

## 2020-06-13 NOTE — ED Notes (Signed)
Pt given water per Dr. Roslynn Amble. Will reassess pt in 30 minutes.

## 2020-06-13 NOTE — ED Provider Notes (Signed)
69 year old lady presents to ER with concern for weakness, abdominal pain as well as episodes of sweating in her lower extremities that have been going on for weeks to months.  Her lab work was grossly within normal limits.  UA concerning for likely infection.  CT scan ordered to rule out acute abdominal pelvic pathology.  4:00 PM Received signout from [place] -follow-up on CT results  Reviewed CT results, discussed abnormal liver lesion with Dr. Laurier Nancy who recommended outpatient follow-up for outpatient MRI, she will send message to patient's primary gastroenterologist to help make these arrangements  6:16 PM  Discussed CT findings with patient, she is currently well-appearing in no distress with stable vital signs.  She demonstrated understanding and need to follow-up with GI for further evaluation.  Will prescribe a course of Keflex and discharged home.   Lucrezia Starch, MD 06/13/20 [id number]

## 2020-06-13 NOTE — ED Triage Notes (Addendum)
Pt BIB EMS from Facility due to possible UTI. 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 Facility stated that they think she might have a UTI and "something" going on with her bladder per EMS.Pt has also been hypotensive per facility.

## 2020-06-13 NOTE — Discharge Instructions (Addendum)
Take antibiotic as prescribed for suspected urinary tract infection.  For the spot on your liver, please contact your gastroenterology office tomorrow morning to get a close follow-up appointment to discuss further imaging.  For the sweating, recommend follow-up with a neurologist.  You may contact either the numbers listed.  If you develop fever, vomiting, abdominal pain or other concerning symptom, return to ER for reassessment.

## 2020-06-13 NOTE — ED Notes (Signed)
Pt is PTAR hold. Will get last set of vitals once pt is leaving.

## 2020-06-13 NOTE — ED Notes (Signed)
Full linen change and bagged food provided to pt.

## 2020-06-13 NOTE — ED Provider Notes (Signed)
[facility] Provider Note   CSN: [id number] Arrival date & time: 06/13/20  1308     History Chief Complaint  Patient presents with  . Hypotension    [name] is a 69 y.o. female.  HPI 69 year old female presents with chief complaint of weakness.  Her symptoms have been ongoing for a while.  She has been having lower half of her body sweating on and off for weeks to months.  Over the last couple weeks she has been having some headaches, cough, body aches.  However last night she seemed weaker than normal and she felt cold in her upper body like chills which is not a sensation she has been having.  She has paraplegia and so she does not know when she needs to urinate but think she is urinating about normal.  She is also having some more bowel movements than typical.  She has not had a fever to her knowledge.  She does not feel short of breath or having chest pain. She has been having abdominal pains for the past several weeks.    Past Medical History:  Diagnosis Date  . Acute systolic CHF (congestive heart failure) ([place]) 06/28/2017   EF was normal 2016, now 20% with grade 2 diastolic dysfunction, no significant CAD at cath  . Anemia 10/12/2008  . CKD (chronic kidney disease)   . Depression   . Diaphragmatic hernia without mention of obstruction or gangrene 08/29/2010  . Edema 05/20/2009  . Gastroparesis 10/05/2010  . GERD (gastroesophageal reflux disease) 02/02/2009  . Hx of colonic polyps   . Hypertension 10/12/2008  . Hypotension, unspecified 05/20/2009  . Immobility syndrome (paraplegic) 1977  . Iron deficiency anemia, unspecified 10/12/2008  . Leiomyoma of uterus   . Malignant neoplasm of breast (female), unspecified site 10/05/2010   2003, 2013   . Paraplegia ([place])   . Pleural effusion   . Pneumonia   . Respiratory failure ([place])   . Rheumatoid arthritis ([place]) 01/17/2009    Patient Active Problem List   Diagnosis Date  Noted  . 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 Medication management 06/07/2018  . Cough 06/07/2018  . Cirrhosis ([place]) 04/20/2018  . Obstipation/fecal impaction 02/12/2018  . Intractable vomiting 02/11/2018  . Chronic hepatitis C without hepatic coma ([place]) 01/10/2018  . NICM (nonischemic cardiomyopathy) ([place]) 08/25/2017  . Normal coronary arteries 08/25/2017  . Diastolic dysfunction 88/82/8003  . S/P thoracentesis   . Status post thoracentesis   . S/p percutaneous right heart catheterization   . Pleural effusion on right 06/29/2017  . Respiratory failure with hypoxia ([place]) 06/29/2017  . Pressure injury of skin 06/29/2017  . Hypokalemia 09/21/2016  . Fever in adult 07/28/2016  . Decubitus ulcer of coccyx 02/04/2016  . Hyperglycemia 12/03/2015  . H/O bilateral mastectomy 07/25/2015  . Benign hypertensive heart disease without heart failure 07/15/2015  . Hiatal hernia 07/06/2015  . 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 Pleural effusion 07/06/2015  . History of breast cancer 08/21/2014  . Adynamic ileus ([place]) 07/22/2014  . Elevated liver enzymes 07/22/2014  . CHF (congestive heart failure) ([place]) 07/05/2014  . Gastroparesis 07/05/2014  . Microcytic anemia 01/05/2014  . Insomnia 07/14/2013  . Edema 05/01/2013  . Rheumatoid arthritis ([place]) 02/23/2013  . GERD (gastroesophageal reflux disease) 02/23/2013  . Essential hypertension 02/23/2013  . Neurogenic bladder 02/23/2013  . Paraplegia ([place]) 02/23/2013  . Depression 02/23/2013  . Allergic rhinitis 02/23/2013  . Constipation 02/23/2013  .  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 Breast cancer, left breast ([place]) 09/08/2011    Past Surgical History:  Procedure Laterality Date  . [place]  . BREAST SURGERY  2003   Bilateral mastectomy  . IR FLUORO GUIDE CV LINE RIGHT  06/23/2017  . IR REMOVAL OF PLURAL CATH W/CUFF  02/14/2018  . IR  REMOVAL TUN CV CATH W/O FL  07/15/2017  . IR THORACENTESIS ASP PLEURAL SPACE W/IMG GUIDE  07/30/2017  . IR US GUIDE VASC ACCESS RIGHT  06/23/2017  . PRESSURE ULCER DEBRIDEMENT  2009   on back  . RIGHT/LEFT HEART CATH AND CORONARY ANGIOGRAPHY N/A 07/02/2017   Procedure: RIGHT/LEFT HEART CATH AND CORONARY ANGIOGRAPHY;  Surgeon: Martinique, Peter M, MD;  Location: [facility];  Service: Cardiovascular;  Laterality: N/A;  . WOUND DEBRIDEMENT  09/02/2008   Large sacral back open wound     OB History   No obstetric history on file.     Family History  Problem Relation Age of Onset  . Stroke Mother   . Hypertension Mother   . Hypertension Maternal Aunt   . Colon cancer Maternal Aunt   . Hypertension Maternal Uncle   . Liver disease Brother   . Prostate cancer Maternal Uncle   . Liver disease Brother     Social History   Tobacco Use  . Smoking status: Former Smoker    Packs/day: 1.00    Years: 10.00    Pack years: 10.00    Quit date: 07/28/1995    Years since quitting: 24.8  . Smokeless tobacco: Never Used  Vaping Use  . Vaping Use: Never used  Substance Use Topics  . Alcohol use: No  . Drug use: No    Home Medications Prior to Admission medications   Medication Sig Start Date End Date Taking? Authorizing Provider  Cranberry 450 MG TABS Take 450 mg by mouth daily.   Yes [provider]  linaclotide (LINZESS) 290 MCG CAPS capsule Take 290 mcg by mouth daily before breakfast.   Yes [provider]  losartan (COZAAR) 25 MG tablet Take 25 mg by mouth daily.   Yes [provider]  polyethylene glycol (MIRALAX / GLYCOLAX) packet Take 17 g by mouth every Monday, Wednesday, Friday, and Saturday at 6 PM. Patient taking differently: Take 17 g by mouth 3 (three) times a week. Monday, Wednesday & Friday 02/14/18  Yes Roxan Hockey, MD  acetaminophen (TYLENOL) 325 MG tablet Take 650 mg by mouth every 6 (six) hours as needed.    [provider]    baclofen (LIORESAL) 10 MG tablet Take 10 mg by mouth 3 (three) times daily.     [provider]  Benzocaine 10 MG LOZG Use as directed 1 lozenge in the mouth or throat every 2 (two) hours as needed.    [provider]  bisacodyl (DULCOLAX) 10 MG suppository Place 1 suppository (10 mg total) rectally every Tuesday, Thursday, Saturday, and Sunday at 6 PM. 02/15/18   Roxan Hockey, MD  calcium-vitamin D (OYSTER CALCIUM 500 + D) 500-200 MG-UNIT per tablet Take 1 tablet by mouth 2 (two) times daily.     [provider]  carvedilol (COREG) 12.5 MG tablet Take 1 tablet (12.5 mg total) by mouth 2 (two) times daily with a meal. 06/07/18   Minus Breeding, MD  carvedilol (COREG) 6.25 MG tablet Take 1 tablet (6.25 mg total) by mouth 2 (  two) times daily. Take with 12.5 to make 18.75 twice daily. 08/09/18   Minus Breeding, MD  cetirizine (ZYRTEC ALLERGY) 10 MG tablet Take 10 mg by mouth daily.     [provider]  cholecalciferol (VITAMIN D) 1000 units tablet Take 1,000 Units by mouth daily.    [provider]  diclofenac sodium (VOLTAREN) 1 % GEL Apply 1 application topically 3 (three) times daily. To shoulders    [provider]  furosemide (LASIX) 40 MG tablet Take 40 mg by mouth 2 (two) times daily.     [provider]  hydrocortisone 2.5 % cream Apply 1 application topically every 12 (twelve) hours. To arms and legs    [provider]  hydroxychloroquine (PLAQUENIL) 200 MG tablet Take 200 mg by mouth daily.     [provider]  Multiple Vitamin (MULTIVITAMIN WITH MINERALS) TABS tablet Take 1 tablet by mouth daily.    [provider]  omeprazole (PRILOSEC) 20 MG capsule Take 20 mg by mouth 2 (two) times daily.    [provider]  oxybutynin (DITROPAN) 5 MG tablet Take 5 mg by mouth 2 (two) times daily.    [provider]  sennosides-docusate sodium (SENOKOT-S) 8.6-50 MG tablet Take 2 tablets by mouth  2 (two) times daily. Patient taking differently: Take 2 tablets by mouth 4 (four) times daily.  02/14/18   Roxan Hockey, MD  spironolactone (ALDACTONE) 100 MG tablet Take 1 tablet (100 mg total) by mouth daily. For edema 02/14/18   Roxan Hockey, MD  sucralfate (CARAFATE) 1 GM/10ML suspension Take 10 mLs (1 g total) by mouth every 6 (six) hours as needed. 04/24/19   Armbruster, Carlota Raspberry, MD  tiZANidine (ZANAFLEX) 4 MG tablet Take 4 mg by mouth every 6 (six) hours as needed for muscle spasms.    [provider]  traMADol (ULTRAM) 50 MG tablet Take 1 tablet (50 mg total) by mouth every 6 (six) hours as needed. 02/14/18   Roxan Hockey, MD    Allergies    Patient has no known allergies.  Review of Systems   Review of Systems  Constitutional: Positive for diaphoresis. Negative for fever.  Respiratory: Positive for cough. Negative for shortness of breath.   Cardiovascular: Negative for chest pain and leg swelling.  Gastrointestinal: Positive for abdominal pain.  Neurological: Positive for weakness, light-headedness and headaches.  All other systems reviewed and are negative.   Physical Exam Updated Vital Signs BP 122/67   Pulse 81   Temp 98.9 F (37.2 C) (Rectal)   Resp (!) 25   SpO2 97%   Physical Exam Vitals and nursing note reviewed. Exam conducted with a chaperone present.  Constitutional:      General: She is not in acute distress.    Appearance: She is well-developed. She is not ill-appearing or diaphoretic.  HENT:     Head: Normocephalic and atraumatic.     Right Ear: External ear normal.     Left Ear: External ear normal.     Nose: Nose normal.  Eyes:     General:        Right eye: No discharge.        Left eye: No discharge.  Cardiovascular:     Rate and Rhythm: Normal rate and regular rhythm.     Heart sounds: Normal heart sounds.  Pulmonary:     Effort: Pulmonary effort is normal.     Breath sounds: Normal breath sounds.  Abdominal:  Palpations: Abdomen is soft.     Tenderness: There is generalized abdominal tenderness (worst in right abdomen).  Genitourinary:    Comments: No fecal impaction. Hardly any stool in rectal vault Musculoskeletal:     Right lower leg: No edema.     Left lower leg: No edema.     Comments: Contractures to lower extremities  Skin:    General: Skin is warm and dry.  Neurological:     Mental Status: She is alert.  Psychiatric:        Mood and Affect: Mood is not anxious.     ED Results / Procedures / Treatments   Labs (all labs ordered are listed, but only abnormal results are displayed) Labs Reviewed  COMPREHENSIVE METABOLIC PANEL - Abnormal; Notable for the following components:      Result Value   Glucose, Bld 102 (*)    ALT 45 (*)    All other components within normal limits  CBC WITH DIFFERENTIAL/PLATELET - Abnormal; Notable for the following components:   RBC 5.32 (*)    All other components within normal limits  URINALYSIS, ROUTINE W REFLEX MICROSCOPIC - Abnormal; Notable for the following components:   Color, Urine AMBER (*)    APPearance CLOUDY (*)    Nitrite POSITIVE (*)    Leukocytes,Ua MODERATE (*)    Bacteria, UA MANY (*)    All other components within normal limits  RESPIRATORY PANEL BY RT PCR (FLU A&B, COVID)  URINE CULTURE  CULTURE, BLOOD (ROUTINE X 2)  CULTURE, BLOOD (ROUTINE X 2)  LIPASE, BLOOD  LACTIC ACID, PLASMA  PROTIME-INR  TSH  CK  TROPONIN I (HIGH SENSITIVITY)    EKG EKG Interpretation  Date/Time:  Thursday June 13 2020 14:21:49 EST Ventricular Rate:  84 PR Interval:    QRS Duration: 87 QT Interval:  378 QTC Calculation: 447 R Axis:   18 Text Interpretation: Sinus rhythm Abnormal R-wave progression, early transition Abnormal T, consider ischemia, diffuse leads no significant change since 2019 Confirmed by [name] Gambler [phone]) on 06/13/2020 2:24:29 PM   Radiology DG Chest Portable 1 View  Result Date: 06/13/2020 CLINICAL DATA:   Weakness.  Paraplegia. EXAM: PORTABLE CHEST 1 VIEW COMPARISON:  02/14/2018 FINDINGS: Elevated right hemidiaphragm with mild right lower lobe atelectasis. This was present previously but appears slightly progressive. Left lung is clear.  Heart size and vascularity normal. IMPRESSION: Elevated right hemidiaphragm with right lower lobe atelectasis. Left lung clear. Electronically Signed   By: Franchot Gallo M.D.   On: 06/13/2020 14:43    Procedures Procedures (including critical care time)  Medications Ordered in ED Medications  sodium chloride 0.9 % bolus 500 mL (0 mLs Intravenous Stopped 06/13/20 1603)  cefTRIAXone (ROCEPHIN) 1 g in sodium chloride 0.9 % 100 mL IVPB (0 g Intravenous Stopped 06/13/20 1603)    ED Course  I have reviewed the triage vital signs and the nursing notes.  Pertinent labs & imaging results that were available during my care of the patient were reviewed by me and considered in my medical decision making (see chart for details).    MDM Rules/Calculators/A&P                          Patient has soft blood pressures here but this has come up with IV fluids.  She has numerous symptoms but overall has unremarkable blood work.  Urinalysis is consistent with UTI.  Will send for culture.  She was given a dose  of IV Rocephin.  Screening for sepsis is negative and she is not febrile.  However given her diffuse abdominal discomfort, CT abdomen and pelvis will be obtained.  There is no fecal impaction on exam.  Care to Dr. Roslynn Amble. Final Clinical Impression(s) / ED Diagnoses Final diagnoses:  None    Rx / DC Orders ED Discharge Orders    None       [name] Gambler, MD 06/13/20 [id number]

## 2020-06-13 NOTE — Telephone Encounter (Signed)
L[facility]I: Armbruster After hours call re: CT findings  Dr. Roslynn Amble called from the ED. New 1.9 cm posterior segment 6 right liver mass, suspicious for hepatocellular carcinoma see on CT scan today. MRI abdomen without and with IV contrast (preferred) or triphasic liver protocol CT abdomen without and with IV contrast recommended for further characterization.  He asked [place] to arrange for the MRI as an outpatient.  Will forward the request to Dr. Havery Moros.

## 2020-06-14 ENCOUNTER — Other Ambulatory Visit: Payer: Self-pay

## 2020-06-14 DIAGNOSIS — K769 Liver disease, unspecified: Secondary | ICD-10-CM

## 2020-06-14 LAB — BLOOD CULTURE ID PANEL (REFLEXED) - BCID2

## 2020-06-14 NOTE — Telephone Encounter (Signed)
Thank you Joelene Millin, sorry to hear about this.   [place] can you let the patient know the ED physician contacted [place] about her CT scan. There is a small spot in her liver we need to further evaluate with an MRI of the liver with and without IV contrast if you can coordinate that for her in the near future. Can you also ask her to go to the lab for an AFP level. Thanks. We will contact her once we have the results of the MRI with further recommendations.

## 2020-06-14 NOTE — Telephone Encounter (Signed)
Called listed number to [facility], was on hold almost 10 minutes.   Spoke with patient's sister, [name], to discuss results and recommendations. She states that patient is not claustrophobic but is paralyzed and in a wheelchair, asked for me to discuss with nurse at [facility].   Patient has been scheduled for an MRI of the liver on 06/21/20 at 4 PM, needs to arrive at 3:30 PM. NPO 4 hours prior. Spoke with Arbie Cookey - Ms. Otterson's nurse and she took all of this information, she is also aware that patient will need lab work at our office, provided the office address and lab hours and location. Arbie Cookey had no other concerns at the end of the call.  Patient's sister [name] is aware of patient's MRI appt and had no other concerns at the end of the call.

## 2020-06-15 LAB — CULTURE, BLOOD (ROUTINE X 2)

## 2020-06-16 ENCOUNTER — Telehealth: Payer: Self-pay | Admitting: Emergency Medicine

## 2020-06-16 LAB — URINE CULTURE: Culture: >=100000 — AB

## 2020-06-16 LAB — CULTURE, BLOOD (ROUTINE X 2)

## 2020-06-16 NOTE — Telephone Encounter (Signed)
Post ED Visit - Positive Culture Follow-up  Culture report reviewed by antimicrobial stewardship pharmacist: [place] Team []  Elenor Quinones, Pharm.D. []  Heide Guile, Pharm.D., BCPS AQ-ID []  Parks Neptune, Pharm.D., BCPS []  Alycia Rossetti, Pharm.D., BCPS []  [place], Pharm.D., BCPS, AAHIVP []  Legrand Como, Pharm.D., BCPS, AAHIVP []  Salome Arnt, PharmD, BCPS []  Johnnette Gourd, PharmD, BCPS []  Hughes Better, PharmD, BCPS [x]  Duanne Limerick, PharmD []  Laqueta Linden, PharmD, BCPS []  Albertina Parr, PharmD  [place] Team []  Leodis Sias, PharmD []  Lindell Spar, PharmD []  Royetta Asal, PharmD []  Graylin Shiver, Rph []  Rema Fendt) Glennon Mac, PharmD []  Arlyn Dunning, PharmD []  Netta Cedars, PharmD []  Dia Sitter, PharmD []  Leone Haven, PharmD []  Gretta Arab, PharmD []  Theodis Shove, PharmD []  Peggyann Juba, PharmD []  Reuel Boom, PharmD   Positive blood culture Treated with Cephalexin, no further patient follow-up is required at this time. Margarita Mail PA  Kempton Milne C Arbie Blankley 06/16/2020, 4:32 PM

## 2020-06-17 ENCOUNTER — Other Ambulatory Visit (HOSPITAL_COMMUNITY): Payer: Self-pay | Admitting: Family

## 2020-06-17 ENCOUNTER — Other Ambulatory Visit: Payer: Medicare Other

## 2020-06-17 DIAGNOSIS — K769 Liver disease, unspecified: Secondary | ICD-10-CM

## 2020-06-17 DIAGNOSIS — B958 Unspecified staphylococcus as the cause of diseases classified elsewhere: Secondary | ICD-10-CM

## 2020-06-18 ENCOUNTER — Telehealth: Payer: Self-pay | Admitting: Gastroenterology

## 2020-06-18 LAB — AFP TUMOR MARKER: AFP-Tumor Marker: 13.2 ng/mL — ABNORMAL HIGH

## 2020-06-18 NOTE — Telephone Encounter (Signed)
Called listed number, sister did not call. She states that patient's number is [phone]. Spoke with patient she had concerns about the MRI machine, advised that she would need to speak with the radiology department because they would be able answer the questions regarding the machine better than I can. Provided patient with [facility] department number. Patient verbalized understanding and had no other concerns at the end of the call.

## 2020-06-18 NOTE — Telephone Encounter (Signed)
Lm on vm for a return call

## 2020-06-19 ENCOUNTER — Other Ambulatory Visit (HOSPITAL_COMMUNITY): Payer: Self-pay | Admitting: Family

## 2020-06-19 ENCOUNTER — Other Ambulatory Visit: Payer: Self-pay

## 2020-06-19 ENCOUNTER — Encounter (HOSPITAL_COMMUNITY)
Admission: RE | Admit: 2020-06-19 | Discharge: 2020-06-19 | Disposition: A | Discharge status: Home / Self Care | Payer: Medicare Other | Source: Ambulatory Visit | Attending: Interventional Radiology | Admitting: Interventional Radiology

## 2020-06-19 ENCOUNTER — Ambulatory Visit (HOSPITAL_COMMUNITY)
Admission: RE | Admit: 2020-06-19 | Discharge: 2020-06-19 | Disposition: A | Discharge status: Home / Self Care | Payer: Medicare Other | Source: Ambulatory Visit | Attending: Family | Admitting: Family

## 2020-06-19 DIAGNOSIS — N189 Chronic kidney disease, unspecified: Secondary | ICD-10-CM | POA: Diagnosis not present

## 2020-06-19 DIAGNOSIS — K769 Liver disease, unspecified: Secondary | ICD-10-CM | POA: Diagnosis not present

## 2020-06-19 DIAGNOSIS — R7881 Bacteremia: Secondary | ICD-10-CM | POA: Insufficient documentation

## 2020-06-19 DIAGNOSIS — B958 Unspecified staphylococcus as the cause of diseases classified elsewhere: Secondary | ICD-10-CM | POA: Insufficient documentation

## 2020-06-19 DIAGNOSIS — I130 Hypertensive heart and chronic kidney disease with heart failure and stage 1 through stage 4 chronic kidney disease, or unspecified chronic kidney disease: Secondary | ICD-10-CM | POA: Insufficient documentation

## 2020-06-19 DIAGNOSIS — I5021 Acute systolic (congestive) heart failure: Secondary | ICD-10-CM | POA: Insufficient documentation

## 2020-06-19 DIAGNOSIS — Z87891 Personal history of nicotine dependence: Secondary | ICD-10-CM | POA: Insufficient documentation

## 2020-06-19 DIAGNOSIS — T82594A Other mechanical complication of infusion catheter, initial encounter: Secondary | ICD-10-CM | POA: Insufficient documentation

## 2020-06-19 DIAGNOSIS — Z79899 Other long term (current) drug therapy: Secondary | ICD-10-CM | POA: Diagnosis not present

## 2020-06-19 MED ORDER — LIDOCAINE HCL 1 % IJ SOLN
INTRAMUSCULAR | Status: AC
  Filled 2020-06-19: qty 20

## 2020-06-19 MED ORDER — HEPARIN SOD (PORK) LOCK FLUSH 100 UNIT/ML IV SOLN
INTRAVENOUS | Status: AC
  Filled 2020-06-19: qty 5

## 2020-06-19 MED ORDER — HEPARIN SOD (PORK) LOCK FLUSH 100 UNIT/ML IV SOLN: 250.0000 [IU] | Freq: Every day | INTRAVENOUS | Status: DC

## 2020-06-19 MED ORDER — HEPARIN SOD (PORK) LOCK FLUSH 100 UNIT/ML IV SOLN
INTRAVENOUS | Status: DC | PRN
  Administered 2020-06-19 (×2): 500 [IU]

## 2020-06-19 MED ORDER — HEPARIN SOD (PORK) LOCK FLUSH 100 UNIT/ML IV SOLN
INTRAVENOUS | Status: AC
  Administered 2020-06-19: 250 [IU]
  Filled 2020-06-19: qty 5

## 2020-06-19 MED ORDER — LIDOCAINE HCL 1 % IJ SOLN
INTRAMUSCULAR | Status: DC | PRN
  Administered 2020-06-19: 10 mL via INTRADERMAL
  Administered 2020-06-19: 20 mL via INTRADERMAL

## 2020-06-19 MED ORDER — VANCOMYCIN HCL IN DEXTROSE 1-5 GM/200ML-% IV SOLN
1000.0000 mg | Freq: Once | INTRAVENOUS | Status: AC
  Administered 2020-06-19: 1000 mg via INTRAVENOUS

## 2020-06-19 MED ORDER — HEPARIN SOD (PORK) LOCK FLUSH 100 UNIT/ML IV SOLN: 250.0000 [IU] | INTRAVENOUS | Status: DC | PRN

## 2020-06-19 MED ORDER — VANCOMYCIN HCL 10 G IV SOLR: 250.0000 mg | Freq: Once | INTRAVENOUS | Status: DC

## 2020-06-19 NOTE — Procedures (Signed)
PROCEDURE SUMMARY:  Successful placement of image-guided single lumen PICC line to the right basilic vein. Length 38 cm. Tip at lower SVC/RA. No complications. EBL = <5 mL. PICC flushed, aspirated, capped and dressed. Ready for immeidate use.  Please see imaging section of Epic for full dictation.   Joaquim Nam PA-C 06/19/2020 9:02 AM

## 2020-06-21 ENCOUNTER — Ambulatory Visit (HOSPITAL_COMMUNITY)
Admission: RE | Admit: 2020-06-21 | Discharge: 2020-06-21 | Disposition: A | Discharge status: Home / Self Care | Payer: Medicare Other | Source: Ambulatory Visit | Attending: Gastroenterology | Admitting: Gastroenterology

## 2020-06-21 DIAGNOSIS — K769 Liver disease, unspecified: Secondary | ICD-10-CM | POA: Diagnosis not present

## 2020-06-21 MED ORDER — GADOBUTROL 1 MMOL/ML IV SOLN
8.0000 mL | Freq: Once | INTRAVENOUS | Status: AC | PRN
  Administered 2020-06-21: 8 mL via INTRAVENOUS

## 2020-06-25 ENCOUNTER — Emergency Department (HOSPITAL_COMMUNITY)
Admission: EM | Admit: 2020-06-25 | Discharge: 2020-06-26 | Disposition: A | Discharge status: Home / Self Care | Payer: Medicare Other | Attending: Emergency Medicine | Admitting: Emergency Medicine

## 2020-06-25 ENCOUNTER — Other Ambulatory Visit: Payer: Self-pay

## 2020-06-25 ENCOUNTER — Encounter (HOSPITAL_COMMUNITY): Payer: Self-pay | Admitting: Emergency Medicine

## 2020-06-25 DIAGNOSIS — Z87891 Personal history of nicotine dependence: Secondary | ICD-10-CM | POA: Insufficient documentation

## 2020-06-25 DIAGNOSIS — N189 Chronic kidney disease, unspecified: Secondary | ICD-10-CM | POA: Diagnosis not present

## 2020-06-25 DIAGNOSIS — T82594A Other mechanical complication of infusion catheter, initial encounter: Secondary | ICD-10-CM | POA: Insufficient documentation

## 2020-06-25 DIAGNOSIS — B958 Unspecified staphylococcus as the cause of diseases classified elsewhere: Secondary | ICD-10-CM | POA: Insufficient documentation

## 2020-06-25 DIAGNOSIS — I5021 Acute systolic (congestive) heart failure: Secondary | ICD-10-CM | POA: Insufficient documentation

## 2020-06-25 DIAGNOSIS — I130 Hypertensive heart and chronic kidney disease with heart failure and stage 1 through stage 4 chronic kidney disease, or unspecified chronic kidney disease: Secondary | ICD-10-CM | POA: Diagnosis not present

## 2020-06-25 DIAGNOSIS — Z79899 Other long term (current) drug therapy: Secondary | ICD-10-CM | POA: Diagnosis not present

## 2020-06-25 LAB — COMPREHENSIVE METABOLIC PANEL
ALT: 39 U/L (ref 0–44)
AST: 30 U/L (ref 15–41)
Albumin: 3.9 g/dL (ref 3.5–5.0)
Alkaline Phosphatase: 53 U/L (ref 38–126)
Anion gap: 14 (ref 5–15)
BUN: 11 mg/dL (ref 8–23)
CO2: 25 mmol/L (ref 22–32)
Calcium: 9.5 mg/dL (ref 8.9–10.3)
Chloride: 100 mmol/L (ref 98–111)
Creatinine, Ser: 0.72 mg/dL (ref 0.44–1.00)
GFR, Estimated: >60 mL/min (ref >60)
Glucose, Bld: 102 mg/dL — ABNORMAL HIGH (ref 70–99)
Potassium: 3.5 mmol/L (ref 3.5–5.1)
Sodium: 139 mmol/L (ref 135–145)
Total Bilirubin: 0.5 mg/dL (ref 0.3–1.2)
Total Protein: 8.7 g/dL — ABNORMAL HIGH (ref 6.5–8.1)

## 2020-06-25 LAB — CBC WITH DIFFERENTIAL/PLATELET
Abs Immature Granulocytes: 0.04 10*3/uL (ref 0.00–0.07)
Basophils Absolute: 0 10*3/uL (ref 0.0–0.1)
Basophils Relative: 1 %
Eosinophils Absolute: 0.1 10*3/uL (ref 0.0–0.5)
Eosinophils Relative: 2 %
HCT: 47.2 % — ABNORMAL HIGH (ref 36.0–46.0)
Hemoglobin: 14.9 g/dL (ref 12.0–15.0)
Immature Granulocytes: 1 %
Lymphocytes Relative: 24 %
Lymphs Abs: 1.6 10*3/uL (ref 0.7–4.0)
MCH: 26.7 pg (ref 26.0–34.0)
MCHC: 31.6 g/dL (ref 30.0–36.0)
MCV: 84.4 fL (ref 80.0–100.0)
Monocytes Absolute: 0.4 10*3/uL (ref 0.1–1.0)
Monocytes Relative: 6 %
Neutro Abs: 4.4 10*3/uL (ref 1.7–7.7)
Neutrophils Relative %: 66 %
Platelets: 201 10*3/uL (ref 150–400)
RBC: 5.59 MIL/uL — ABNORMAL HIGH (ref 3.87–5.11)
RDW: 13.7 % (ref 11.5–15.5)
WBC: 6.6 10*3/uL (ref 4.0–10.5)
nRBC: 0 % (ref 0.0–0.2)

## 2020-06-25 NOTE — ED Triage Notes (Signed)
Pt arrives via ems from [facility], pt has PICC line to R upper arm that she states that facility drew blood from her this morning, however, when the nurse came in to start her antibiotics, they said she refused blood and they didn't have any specimens so they sent her here for have blood drawn. Pt reports she had blood drawn from her picc and is unsure why she had to come here. Agreeable to having blood drawn here in ED.

## 2020-06-25 NOTE — ED Provider Notes (Signed)
[facility] EMERGENCY DEPARTMENT Provider Note   CSN: [id number] Arrival date & time: 06/25/20  1249     History Chief Complaint  Patient presents with  . Vascular Access Problem    [name] LEVITAN is a 69 y.o. female.  Patient states she was sent here because she supposed to get blood levels drawn to dose her vancomycin but the facility said she refused even though she did not, so they sent her here.  She has no fevers chills.  She is on vancomycin for Staphylococcus bacterial infection.  She is eating and drinking well, normal bowel bladder function for her.  Currently under treatment for UTI as well.  No complaints at this time.        Past Medical History:  Diagnosis Date  . Acute systolic CHF (congestive heart failure) ([place]) 06/28/2017   EF was normal 2016, now 20% with grade 2 diastolic dysfunction, no significant CAD at cath  . Anemia 10/12/2008  . CKD (chronic kidney disease)   . Depression   . Diaphragmatic hernia without mention of obstruction or gangrene 08/29/2010  . Edema 05/20/2009  . Gastroparesis 10/05/2010  . GERD (gastroesophageal reflux disease) 02/02/2009  . Hx of colonic polyps   . Hypertension 10/12/2008  . Hypotension, unspecified 05/20/2009  . Immobility syndrome (paraplegic) 1977  . Iron deficiency anemia, unspecified 10/12/2008  . Leiomyoma of uterus   . Malignant neoplasm of breast (female), unspecified site 10/05/2010   2003, 2013   . Paraplegia ([place])   . Pleural effusion   . Pneumonia   . Respiratory failure ([place])   . Rheumatoid arthritis ([place]) 01/17/2009    Patient Active Problem List   Diagnosis Date Noted  . 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 Medication management 06/07/2018  . Cough 06/07/2018  . Cirrhosis ([place]) 04/20/2018  . Obstipation/fecal impaction 02/12/2018  . Intractable vomiting 02/11/2018  . Chronic hepatitis C without hepatic coma ([place]) 01/10/2018  . NICM (nonischemic  cardiomyopathy) ([place]) 08/25/2017  . Normal coronary arteries 08/25/2017  . Diastolic dysfunction 82/50/0370  . S/P thoracentesis   . Status post thoracentesis   . S/p percutaneous right heart catheterization   . Pleural effusion on right 06/29/2017  . Respiratory failure with hypoxia ([place]) 06/29/2017  . Pressure injury of skin 06/29/2017  . Hypokalemia 09/21/2016  . Fever in adult 07/28/2016  . Decubitus ulcer of coccyx 02/04/2016  . Hyperglycemia 12/03/2015  . H/O bilateral mastectomy 07/25/2015  . Benign hypertensive heart disease without heart failure 07/15/2015  . Hiatal hernia 07/06/2015  . 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 Pleural effusion 07/06/2015  . History of breast cancer 08/21/2014  . Adynamic ileus ([place]) 07/22/2014  . Elevated liver enzymes 07/22/2014  . CHF (congestive heart failure) ([place]) 07/05/2014  . Gastroparesis 07/05/2014  . Microcytic anemia 01/05/2014  . Insomnia 07/14/2013  . Edema 05/01/2013  . Rheumatoid arthritis ([place]) 02/23/2013  . GERD (gastroesophageal reflux disease) 02/23/2013  . Essential hypertension 02/23/2013  . Neurogenic bladder 02/23/2013  . Paraplegia ([place]) 02/23/2013  . Depression 02/23/2013  . Allergic rhinitis 02/23/2013  . Constipation 02/23/2013  . 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[place]) 09/24/2011  . Breast cancer, left breast ([place]) 09/08/2011    Past Surgical History:  Procedure Laterality Date  . [place]  . BREAST SURGERY  2003   Bilateral mastectomy  . IR FLUORO GUIDE CV LINE RIGHT  06/23/2017  . IR REMOVAL  OF PLURAL CATH W/CUFF  02/14/2018  . IR REMOVAL TUN CV CATH W/O FL  07/15/2017  . IR THORACENTESIS ASP PLEURAL SPACE W/IMG GUIDE  07/30/2017  . IR US GUIDE VASC ACCESS RIGHT  06/23/2017  . PRESSURE ULCER DEBRIDEMENT  2009   on back  . RIGHT/LEFT HEART CATH AND CORONARY ANGIOGRAPHY N/A 07/02/2017   Procedure: RIGHT/LEFT HEART CATH AND CORONARY ANGIOGRAPHY;   Surgeon: Martinique, Peter M, MD;  Location: [facility];  Service: Cardiovascular;  Laterality: N/A;  . WOUND DEBRIDEMENT  09/02/2008   Large sacral back open wound     OB History   No obstetric history on file.     Family History  Problem Relation Age of Onset  . Stroke Mother   . Hypertension Mother   . Hypertension Maternal Aunt   . Colon cancer Maternal Aunt   . Hypertension Maternal Uncle   . Liver disease Brother   . Prostate cancer Maternal Uncle   . Liver disease Brother     Social History   Tobacco Use  . Smoking status: Former Smoker    Packs/day: 1.00    Years: 10.00    Pack years: 10.00    Quit date: 07/28/1995    Years since quitting: 24.9  . Smokeless tobacco: Never Used  Vaping Use  . Vaping Use: Never used  Substance Use Topics  . Alcohol use: No  . Drug use: No    Home Medications Prior to Admission medications   Medication Sig Start Date End Date Taking? Authorizing Provider  acetaminophen (TYLENOL) 325 MG tablet Take 650 mg by mouth every 6 (six) hours as needed.    [provider]  albuterol (VENTOLIN HFA) 108 (90 Base) MCG/ACT inhaler Inhale 2 puffs into the lungs every 4 (four) hours as needed for wheezing or shortness of breath.    [provider]  alum & mag hydroxide-simeth (MYLANTA MAXIMUM STRENGTH) 400-400-40 MG/5ML suspension Take 10 mLs by mouth every 8 (eight) hours as needed for indigestion (nausea, Gas).    [provider]  baclofen (LIORESAL) 10 MG tablet Take 10 mg by mouth 4 (four) times daily.     [provider]  Benzocaine 10 MG LOZG Use as directed 1 lozenge in the mouth or throat every 2 (two) hours as needed.    [provider]  bisacodyl (DULCOLAX) 10 MG suppository Place 1 suppository (10 mg total) rectally every Tuesday, Thursday, Saturday, and Sunday at 6 PM. 02/15/18   Roxan Hockey, MD  calcium-vitamin D (OYSTER CALCIUM 500 + D) 500-200 MG-UNIT per tablet Take 1 tablet by  mouth 2 (two) times daily.     [provider]  carvedilol (COREG) 12.5 MG tablet Take 1 tablet (12.5 mg total) by mouth 2 (two) times daily with a meal. 06/07/18   Minus Breeding, MD  carvedilol (COREG) 6.25 MG tablet Take 1 tablet (6.25 mg total) by mouth 2 (two) times daily. Take with 12.5 to make 18.75 twice daily. 08/09/18   Minus Breeding, MD  cetirizine (ZYRTEC ALLERGY) 10 MG tablet Take 10 mg by mouth daily as needed for allergies.     [provider]  cholecalciferol (VITAMIN D) 1000 units tablet Take 1,000 Units by mouth daily.    [provider]  Cranberry 450 MG TABS Take 450 mg by mouth daily.    [provider]  diclofenac sodium (VOLTAREN) 1 % GEL Apply 1 application topically 3 (three) times daily. To shoulders    [provider]  docusate sodium (COLACE) 100 MG capsule Take 100 mg by mouth in the morning, at noon, and at bedtime.    [provider]  furosemide (LASIX) 40 MG tablet Take 40 mg by mouth 2 (two) times daily.     [provider]  hydrocortisone 2.5 % cream Apply 1 application topically every 12 (twelve) hours. To arms and legs    [provider]  hydroxychloroquine (PLAQUENIL) 200 MG tablet Take 200 mg by mouth daily.     [provider]  linaclotide (LINZESS) 290 MCG CAPS capsule Take 290 mcg by mouth daily before breakfast.    [provider]  losartan (COZAAR) 25 MG tablet Take 25 mg by mouth daily.    [provider]  melatonin 5 MG TABS Take 5 mg by mouth at bedtime.    [provider]  Multiple Vitamin (MULTIVITAMIN WITH MINERALS) TABS tablet Take 1 tablet by mouth daily.    [provider]  omeprazole (PRILOSEC) 20 MG capsule Take 20 mg by mouth 2 (two) times daily.    [provider]  oxybutynin (DITROPAN) 5 MG tablet Take 5 mg by mouth daily.     [provider]  polyethylene glycol (MIRALAX / GLYCOLAX) packet Take 17 g by mouth  every Monday, Wednesday, Friday, and Saturday at 6 PM. Patient taking differently: Take 17 g by mouth 3 (three) times a week. Monday, Wednesday & Friday 02/14/18   Roxan Hockey, MD  sennosides-docusate sodium (SENOKOT-S) 8.6-50 MG tablet Take 2 tablets by mouth 2 (two) times daily. Patient taking differently: Take 2 tablets by mouth 4 (four) times daily.  02/14/18   Roxan Hockey, MD  spironolactone (ALDACTONE) 100 MG tablet Take 1 tablet (100 mg total) by mouth daily. For edema 02/14/18   Roxan Hockey, MD  sucralfate (CARAFATE) 1 GM/10ML suspension Take 1 g by mouth every 6 (six) hours as needed (for [place]).  04/24/19   Armbruster, Carlota Raspberry, MD  tiZANidine (ZANAFLEX) 4 MG tablet Take 4 mg by mouth 3 (three) times daily. For Muscle spasms    [provider]  traMADol (ULTRAM) 50 MG tablet Take 1 tablet (50 mg total) by mouth every 6 (six) hours as needed. 02/14/18   Roxan Hockey, MD    Allergies    Patient has no known allergies.  Review of Systems   Review of Systems  Constitutional: Negative for chills and fever.  HENT: Negative for congestion and rhinorrhea.   Respiratory: Negative for cough and shortness of breath.   Cardiovascular: Negative for chest pain and palpitations.  Gastrointestinal: Negative for diarrhea, nausea and vomiting.  Genitourinary: Negative for difficulty urinating and dysuria.  Musculoskeletal: Negative for arthralgias and back pain.  Skin: Negative for rash and wound.  Neurological: Negative for light-headedness and headaches.    Physical Exam Updated Vital Signs BP (!) 125/107 (BP Location: Left Wrist)   Pulse 67   Temp 98.1 F (36.7 C) (Oral)   Resp 18   Ht 5' 5.5" (1.664 m)   Wt 77.1 kg   SpO2 97%   BMI 27.86 kg/m   Physical Exam Vitals and nursing note reviewed. Exam conducted with a chaperone present.  Constitutional:      General: She is not in acute distress.    Appearance: Normal appearance.  HENT:     Head:  Normocephalic and atraumatic.     Nose: No rhinorrhea.  Eyes:     General:  Right eye: No discharge.        Left eye: No discharge.     Conjunctiva/sclera: Conjunctivae normal.  Cardiovascular:     Rate and Rhythm: Normal rate and regular rhythm.  Pulmonary:     Effort: Pulmonary effort is normal. No respiratory distress.     Breath sounds: No stridor.  Abdominal:     General: Abdomen is flat. There is no distension.     Palpations: Abdomen is soft.     Tenderness: There is no abdominal tenderness.  Musculoskeletal:        General: No tenderness or signs of injury.  Skin:    General: Skin is warm and dry.  Neurological:     General: No focal deficit present.     Mental Status: She is alert. Mental status is at baseline.  Psychiatric:        Mood and Affect: Mood normal.        Behavior: Behavior normal.     ED Results / Procedures / Treatments   Labs (all labs ordered are listed, but only abnormal results are displayed) Labs Reviewed  COMPREHENSIVE METABOLIC PANEL - Abnormal; Notable for the following components:      Result Value   Glucose, Bld 102 (*)    Total Protein 8.7 (*)    All other components within normal limits  CBC WITH DIFFERENTIAL/PLATELET - Abnormal; Notable for the following components:   RBC 5.59 (*)    HCT 47.2 (*)    All other components within normal limits    EKG None  Radiology No results found.  Procedures Procedures (including critical care time)  Medications Ordered in ED Medications - No data to display  ED Course  I have reviewed the triage vital signs and the nursing notes.  Pertinent labs & imaging results that were available during my care of the patient were reviewed by me and considered in my medical decision making (see chart for details).    MDM Rules/Calculators/A&P                          Here for vancomycin level and dosing.  Sent from assisted living facility.  No signs of acute decompensation, having  difficulty drawing from a PICC line but it infuses well.  Showed no significant dysfunction or electrolyte derangements after my review.  I was able to contact the facility, they commented that they do have a vancomycin level drawn from this morning and that they would be able to dose her home vancomycin medications.  I then began to ask them why she was sent to [place] if that was the case.  They told me that it is because she refused a lab draw.  I then asked why did she need a lab draw if she already had the labs done.  They said they are not sure but she refused so they sent him to [place].  I then tried to tell them that it seemed inappropriate to send the patient to the emergency department where no work-up was needed and there were no emergent conditions.  They asked me to counsel the patient that the patient needs to have lab draws done and not to refuse, I informed them that the patient is able to do what they want, that they have autonomy to make their own decisions.  And that is not my place to counsel the patient on getting the work-up they need especially considering the patient already  got the work-up needed today.  The facility then hung up on me and I called back multiple times and no one would answer.  It seems that the patient is concerned with the communication at the facility, and I understand her concern based on my conversation with the team at her facility. The patient does feel comfortable being discharged home.  We will have our vascular access team to see if they can help her with PICC line not drawing back.  Final Clinical Impression(s) / ED Diagnoses Final diagnoses:  Staph infection    Rx / DC Orders ED Discharge Orders    None       Breck Coons, MD 06/25/20 1706

## 2020-06-27 ENCOUNTER — Other Ambulatory Visit: Payer: Self-pay

## 2020-06-27 DIAGNOSIS — C220 Liver cell carcinoma: Secondary | ICD-10-CM

## 2020-06-27 NOTE — Progress Notes (Signed)
[facility] (SNF) where patient resides with appointment for medical oncology consult on 07/15/2020 at 2:00 needs to arrive by 1:45.  I requested they provide transport to [facility].  I also asked that they confirm receipt and confirm they will provide transport.

## 2020-07-10 ENCOUNTER — Other Ambulatory Visit: Payer: Self-pay

## 2020-07-10 NOTE — Progress Notes (Signed)
The proposed treatment discussed in conference is for discussion purposes only and is not a binding recommendation.  The patients have not been physically examined, or presented with their treatment options.  Therefore, final treatment plans cannot be decided.

## 2020-07-11 ENCOUNTER — Ambulatory Visit (HOSPITAL_COMMUNITY): Payer: Medicare Other

## 2020-07-15 ENCOUNTER — Other Ambulatory Visit: Payer: Self-pay

## 2020-07-15 ENCOUNTER — Inpatient Hospital Stay: Payer: Medicare Other | Attending: Oncology | Admitting: Oncology

## 2020-07-15 VITALS — BP 119/70 | HR 74 | Temp 97.8°F | Resp 18 | Ht 65.0 in | Wt 168.5 lb

## 2020-07-15 DIAGNOSIS — N189 Chronic kidney disease, unspecified: Secondary | ICD-10-CM | POA: Insufficient documentation

## 2020-07-15 DIAGNOSIS — C220 Liver cell carcinoma: Secondary | ICD-10-CM

## 2020-07-15 DIAGNOSIS — Z853 Personal history of malignant neoplasm of breast: Secondary | ICD-10-CM | POA: Insufficient documentation

## 2020-07-15 DIAGNOSIS — Z79899 Other long term (current) drug therapy: Secondary | ICD-10-CM | POA: Diagnosis not present

## 2020-07-15 DIAGNOSIS — I509 Heart failure, unspecified: Secondary | ICD-10-CM | POA: Insufficient documentation

## 2020-07-15 DIAGNOSIS — Z8619 Personal history of other infectious and parasitic diseases: Secondary | ICD-10-CM | POA: Diagnosis not present

## 2020-07-15 DIAGNOSIS — I130 Hypertensive heart and chronic kidney disease with heart failure and stage 1 through stage 4 chronic kidney disease, or unspecified chronic kidney disease: Secondary | ICD-10-CM | POA: Insufficient documentation

## 2020-07-15 DIAGNOSIS — G8220 Paraplegia, unspecified: Secondary | ICD-10-CM | POA: Diagnosis not present

## 2020-07-15 DIAGNOSIS — Z87891 Personal history of nicotine dependence: Secondary | ICD-10-CM | POA: Diagnosis not present

## 2020-07-15 DIAGNOSIS — K7460 Unspecified cirrhosis of liver: Secondary | ICD-10-CM | POA: Diagnosis not present

## 2020-07-15 DIAGNOSIS — Z9013 Acquired absence of bilateral breasts and nipples: Secondary | ICD-10-CM | POA: Insufficient documentation

## 2020-07-15 DIAGNOSIS — K219 Gastro-esophageal reflux disease without esophagitis: Secondary | ICD-10-CM | POA: Diagnosis not present
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
## 2020-07-16 ENCOUNTER — Ambulatory Visit (INDEPENDENT_AMBULATORY_CARE_PROVIDER_SITE_OTHER): Payer: Medicare Other | Admitting: Internal Medicine

## 2020-07-16 DIAGNOSIS — G8220 Paraplegia, unspecified: Secondary | ICD-10-CM | POA: Diagnosis not present

## 2020-07-16 DIAGNOSIS — R8271 Bacteriuria: Secondary | ICD-10-CM | POA: Diagnosis not present

## 2020-07-16 DIAGNOSIS — R7881 Bacteremia: Secondary | ICD-10-CM

## 2020-07-16 NOTE — Patient Instructions (Signed)
You didn't have MRSA blood poisoning. It was just a cousin of mrsa which don't cause issue, unless you have hardware or venous catheters attached to you, or if it persistently grow  -repeat blood cultures today; if positive will let you know and get treatment   You don't appear to have bladder infection. When your bladder dont' function well from spinal cord injury, ones often get colonized with bacteria. And this was what we seen. You will likely always have some bacteria in your bladder that don't cause symptoms and don't need treatment. Avoid checking urine unless you have sx
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
## 2020-07-16 NOTE — Progress Notes (Signed)
Met with patient and her sister [name] at her medical oncology consult with Dr. Julieanne Manson.  I explained my role as nurse navigator and she was given my card with my direct contact information.  She understands the plan of care per Dr. Benay Spice to refer to IR for possible ablation of the liver lesion.

## 2020-07-17 ENCOUNTER — Telehealth: Payer: Self-pay | Admitting: Oncology

## 2020-07-17 ENCOUNTER — Encounter: Payer: Self-pay | Admitting: Physical Medicine and Rehabilitation

## 2020-07-17 NOTE — Telephone Encounter (Signed)
Scheduled per 12/20 los, patient has been called and voicemail was left.

## 2020-07-22 LAB — CULTURE, BLOOD (SINGLE)
MICRO NUMBER:: 11348004
MICRO NUMBER:: 11348005
Result:: NO GROWTH
SPECIMEN QUALITY:: ADEQUATE

## 2020-08-30 ENCOUNTER — Other Ambulatory Visit: Payer: Self-pay

## 2020-08-30 ENCOUNTER — Encounter: Payer: Self-pay | Admitting: Physical Medicine and Rehabilitation

## 2020-08-30 ENCOUNTER — Encounter
Payer: Medicare Other | Attending: Physical Medicine and Rehabilitation | Admitting: Physical Medicine and Rehabilitation

## 2020-08-30 VITALS — BP 91/59 | HR 66 | Ht 65.0 in | Wt 170.0 lb

## 2020-08-30 DIAGNOSIS — Z993 Dependence on wheelchair: Secondary | ICD-10-CM | POA: Insufficient documentation

## 2020-08-30 DIAGNOSIS — K592 Neurogenic bowel, not elsewhere classified: Secondary | ICD-10-CM | POA: Diagnosis present

## 2020-08-30 DIAGNOSIS — G909 Disorder of the autonomic nervous system, unspecified: Secondary | ICD-10-CM | POA: Diagnosis present

## 2020-08-30 DIAGNOSIS — G8220 Paraplegia, unspecified: Secondary | ICD-10-CM | POA: Diagnosis present

## 2020-08-30 DIAGNOSIS — N319 Neuromuscular dysfunction of bladder, unspecified: Secondary | ICD-10-CM | POA: Diagnosis present
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
## 2020-10-11 ENCOUNTER — Other Ambulatory Visit: Payer: Self-pay

## 2020-10-11 ENCOUNTER — Encounter: Payer: Self-pay | Admitting: Physical Medicine and Rehabilitation

## 2020-10-11 ENCOUNTER — Encounter
Payer: Medicare Other | Attending: Physical Medicine and Rehabilitation | Admitting: Physical Medicine and Rehabilitation

## 2020-10-11 VITALS — BP 117/66 | HR 70 | Temp 98.8°F | Ht 65.0 in | Wt 170.0 lb

## 2020-10-11 DIAGNOSIS — G904 Autonomic dysreflexia: Secondary | ICD-10-CM | POA: Diagnosis present

## 2020-10-11 DIAGNOSIS — N319 Neuromuscular dysfunction of bladder, unspecified: Secondary | ICD-10-CM | POA: Insufficient documentation

## 2020-10-11 DIAGNOSIS — G909 Disorder of the autonomic nervous system, unspecified: Secondary | ICD-10-CM | POA: Diagnosis present

## 2020-10-11 DIAGNOSIS — G8220 Paraplegia, unspecified: Secondary | ICD-10-CM | POA: Insufficient documentation
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
## 2020-10-18 ENCOUNTER — Encounter (HOSPITAL_COMMUNITY): Payer: Self-pay

## 2020-10-22 ENCOUNTER — Other Ambulatory Visit: Payer: Self-pay

## 2020-10-22 ENCOUNTER — Ambulatory Visit (HOSPITAL_COMMUNITY)
Admission: RE | Admit: 2020-10-22 | Discharge: 2020-10-22 | Disposition: A | Discharge status: Home / Self Care | Payer: Medicare Other | Source: Ambulatory Visit | Attending: Physical Medicine and Rehabilitation | Admitting: Physical Medicine and Rehabilitation

## 2020-10-22 DIAGNOSIS — G904 Autonomic dysreflexia: Secondary | ICD-10-CM | POA: Diagnosis present

## 2020-10-22 DIAGNOSIS — G8220 Paraplegia, unspecified: Secondary | ICD-10-CM | POA: Diagnosis present

## 2020-12-06 ENCOUNTER — Other Ambulatory Visit: Payer: Self-pay

## 2020-12-06 ENCOUNTER — Encounter: Payer: Self-pay | Admitting: Physical Medicine and Rehabilitation

## 2020-12-06 ENCOUNTER — Encounter
Payer: Medicare Other | Attending: Physical Medicine and Rehabilitation | Admitting: Physical Medicine and Rehabilitation

## 2020-12-06 VITALS — BP 100/69 | HR 77 | Temp 98.2°F

## 2020-12-06 DIAGNOSIS — N319 Neuromuscular dysfunction of bladder, unspecified: Secondary | ICD-10-CM

## 2020-12-06 DIAGNOSIS — M24569 Contracture, unspecified knee: Secondary | ICD-10-CM

## 2020-12-06 DIAGNOSIS — R252 Cramp and spasm: Secondary | ICD-10-CM | POA: Diagnosis present

## 2020-12-06 DIAGNOSIS — G8220 Paraplegia, unspecified: Secondary | ICD-10-CM | POA: Diagnosis present

## 2020-12-06 DIAGNOSIS — Z993 Dependence on wheelchair: Secondary | ICD-10-CM
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
## 2020-12-08 ENCOUNTER — Emergency Department (HOSPITAL_COMMUNITY): Payer: Medicare Other

## 2020-12-08 ENCOUNTER — Other Ambulatory Visit: Payer: Self-pay

## 2020-12-08 ENCOUNTER — Inpatient Hospital Stay (HOSPITAL_COMMUNITY)
Admission: EM | Admit: 2020-12-08 | Discharge: 2020-12-11 | DRG: 092 | Disposition: A | Discharge status: Skilled Nursing Facility | Payer: Medicare Other | Attending: Infectious Diseases | Admitting: Infectious Diseases

## 2020-12-08 ENCOUNTER — Encounter (HOSPITAL_COMMUNITY): Payer: Self-pay

## 2020-12-08 DIAGNOSIS — A419 Sepsis, unspecified organism: Secondary | ICD-10-CM | POA: Diagnosis present

## 2020-12-08 DIAGNOSIS — Z823 Family history of stroke: Secondary | ICD-10-CM

## 2020-12-08 DIAGNOSIS — I5042 Chronic combined systolic (congestive) and diastolic (congestive) heart failure: Secondary | ICD-10-CM | POA: Diagnosis present

## 2020-12-08 DIAGNOSIS — Z8505 Personal history of malignant neoplasm of liver: Secondary | ICD-10-CM

## 2020-12-08 DIAGNOSIS — R7303 Prediabetes: Secondary | ICD-10-CM | POA: Diagnosis present

## 2020-12-08 DIAGNOSIS — R1110 Vomiting, unspecified: Secondary | ICD-10-CM

## 2020-12-08 DIAGNOSIS — Z79899 Other long term (current) drug therapy: Secondary | ICD-10-CM

## 2020-12-08 DIAGNOSIS — B182 Chronic viral hepatitis C: Secondary | ICD-10-CM | POA: Diagnosis present

## 2020-12-08 DIAGNOSIS — Z800 Family history of malignant neoplasm of digestive organs: Secondary | ICD-10-CM

## 2020-12-08 DIAGNOSIS — K5900 Constipation, unspecified: Secondary | ICD-10-CM | POA: Diagnosis present

## 2020-12-08 DIAGNOSIS — I130 Hypertensive heart and chronic kidney disease with heart failure and stage 1 through stage 4 chronic kidney disease, or unspecified chronic kidney disease: Secondary | ICD-10-CM | POA: Diagnosis present

## 2020-12-08 DIAGNOSIS — J90 Pleural effusion, not elsewhere classified: Secondary | ICD-10-CM

## 2020-12-08 DIAGNOSIS — K219 Gastro-esophageal reflux disease without esophagitis: Secondary | ICD-10-CM | POA: Diagnosis present

## 2020-12-08 DIAGNOSIS — G4700 Insomnia, unspecified: Secondary | ICD-10-CM | POA: Diagnosis present

## 2020-12-08 DIAGNOSIS — R739 Hyperglycemia, unspecified: Secondary | ICD-10-CM | POA: Diagnosis present

## 2020-12-08 DIAGNOSIS — Z8249 Family history of ischemic heart disease and other diseases of the circulatory system: Secondary | ICD-10-CM

## 2020-12-08 DIAGNOSIS — R188 Other ascites: Secondary | ICD-10-CM

## 2020-12-08 DIAGNOSIS — Z8719 Personal history of other diseases of the digestive system: Secondary | ICD-10-CM

## 2020-12-08 DIAGNOSIS — N3000 Acute cystitis without hematuria: Secondary | ICD-10-CM

## 2020-12-08 DIAGNOSIS — Z20822 Contact with and (suspected) exposure to covid-19: Secondary | ICD-10-CM | POA: Diagnosis present

## 2020-12-08 DIAGNOSIS — R61 Generalized hyperhidrosis: Secondary | ICD-10-CM | POA: Diagnosis present

## 2020-12-08 DIAGNOSIS — I428 Other cardiomyopathies: Secondary | ICD-10-CM | POA: Diagnosis present

## 2020-12-08 DIAGNOSIS — Z87891 Personal history of nicotine dependence: Secondary | ICD-10-CM

## 2020-12-08 DIAGNOSIS — N319 Neuromuscular dysfunction of bladder, unspecified: Secondary | ICD-10-CM | POA: Diagnosis present

## 2020-12-08 DIAGNOSIS — G8220 Paraplegia, unspecified: Secondary | ICD-10-CM | POA: Diagnosis present

## 2020-12-08 DIAGNOSIS — Z9013 Acquired absence of bilateral breasts and nipples: Secondary | ICD-10-CM

## 2020-12-08 DIAGNOSIS — Z87440 Personal history of urinary (tract) infections: Secondary | ICD-10-CM

## 2020-12-08 DIAGNOSIS — D509 Iron deficiency anemia, unspecified: Secondary | ICD-10-CM | POA: Diagnosis present

## 2020-12-08 DIAGNOSIS — N189 Chronic kidney disease, unspecified: Secondary | ICD-10-CM | POA: Diagnosis present

## 2020-12-08 DIAGNOSIS — M069 Rheumatoid arthritis, unspecified: Secondary | ICD-10-CM | POA: Diagnosis present

## 2020-12-08 DIAGNOSIS — F32A Depression, unspecified: Secondary | ICD-10-CM | POA: Diagnosis present

## 2020-12-08 DIAGNOSIS — R110 Nausea: Secondary | ICD-10-CM | POA: Diagnosis not present

## 2020-12-08 DIAGNOSIS — G901 Familial dysautonomia [Riley-Day]: Secondary | ICD-10-CM | POA: Diagnosis not present

## 2020-12-08 DIAGNOSIS — K3184 Gastroparesis: Secondary | ICD-10-CM | POA: Diagnosis present

## 2020-12-08 DIAGNOSIS — K449 Diaphragmatic hernia without obstruction or gangrene: Secondary | ICD-10-CM | POA: Diagnosis present

## 2020-12-08 DIAGNOSIS — K7460 Unspecified cirrhosis of liver: Secondary | ICD-10-CM

## 2020-12-08 DIAGNOSIS — Z853 Personal history of malignant neoplasm of breast: Secondary | ICD-10-CM

## 2020-12-08 DIAGNOSIS — C220 Liver cell carcinoma: Secondary | ICD-10-CM | POA: Diagnosis present

## 2020-12-08 LAB — COMPREHENSIVE METABOLIC PANEL
ALT: 36 U/L (ref 0–44)
AST: 39 U/L (ref 15–41)
Albumin: 3.9 g/dL (ref 3.5–5.0)
Alkaline Phosphatase: 73 U/L (ref 38–126)
Anion gap: 10 (ref 5–15)
BUN: 13 mg/dL (ref 8–23)
CO2: 21 mmol/L — ABNORMAL LOW (ref 22–32)
Calcium: 9.7 mg/dL (ref 8.9–10.3)
Chloride: 105 mmol/L (ref 98–111)
Creatinine, Ser: 0.85 mg/dL (ref 0.44–1.00)
GFR, Estimated: >60 mL/min (ref >60)
Glucose, Bld: 128 mg/dL — ABNORMAL HIGH (ref 70–99)
Potassium: 4.3 mmol/L (ref 3.5–5.1)
Sodium: 136 mmol/L (ref 135–145)
Total Bilirubin: 0.5 mg/dL (ref 0.3–1.2)
Total Protein: 7.8 g/dL (ref 6.5–8.1)

## 2020-12-08 LAB — LIPASE, BLOOD: Lipase: 39 U/L (ref 11–51)

## 2020-12-08 LAB — CBC WITH DIFFERENTIAL/PLATELET
Abs Immature Granulocytes: 0.03 10*3/uL (ref 0.00–0.07)
Basophils Absolute: 0 10*3/uL (ref 0.0–0.1)
Basophils Relative: 0 %
Eosinophils Absolute: 0 10*3/uL (ref 0.0–0.5)
Eosinophils Relative: 0 %
HCT: 45.8 % (ref 36.0–46.0)
Hemoglobin: 14.4 g/dL (ref 12.0–15.0)
Immature Granulocytes: 1 %
Lymphocytes Relative: 8 %
Lymphs Abs: 0.5 10*3/uL — ABNORMAL LOW (ref 0.7–4.0)
MCH: 26.8 pg (ref 26.0–34.0)
MCHC: 31.4 g/dL (ref 30.0–36.0)
MCV: 85.1 fL (ref 80.0–100.0)
Monocytes Absolute: 0.1 10*3/uL (ref 0.1–1.0)
Monocytes Relative: 1 %
Neutro Abs: 5.1 10*3/uL (ref 1.7–7.7)
Neutrophils Relative %: 90 %
Platelets: 185 10*3/uL (ref 150–400)
RBC: 5.38 MIL/uL — ABNORMAL HIGH (ref 3.87–5.11)
RDW: 14.6 % (ref 11.5–15.5)
WBC: 5.7 10*3/uL (ref 4.0–10.5)
nRBC: 0 % (ref 0.0–0.2)

## 2020-12-08 LAB — URINALYSIS, ROUTINE W REFLEX MICROSCOPIC
Bilirubin Urine: NEGATIVE
Glucose, UA: NEGATIVE mg/dL
Ketones, ur: NEGATIVE mg/dL
Nitrite: POSITIVE — AB
Protein, ur: 30 mg/dL — AB
Specific Gravity, Urine: 1.018 (ref 1.005–1.030)
WBC, UA: >50 WBC/hpf — ABNORMAL HIGH (ref 0–5)
pH: 5 (ref 5.0–8.0)

## 2020-12-08 LAB — APTT: aPTT: 20 seconds — ABNORMAL LOW (ref 24–36)

## 2020-12-08 LAB — PROTIME-INR
INR: 1 (ref 0.8–1.2)
Prothrombin Time: 12.9 seconds (ref 11.4–15.2)

## 2020-12-08 LAB — LACTIC ACID, PLASMA
Lactic Acid, Venous: 3.4 mmol/L (ref 0.5–1.9)
Lactic Acid, Venous: 2.5 mmol/L (ref 0.5–1.9)

## 2020-12-08 LAB — RESP PANEL BY RT-PCR (FLU A&B, COVID) ARPGX2
Influenza A by PCR: NEGATIVE
Influenza B by PCR: NEGATIVE
SARS Coronavirus 2 by RT PCR: NEGATIVE

## 2020-12-08 LAB — BRAIN NATRIURETIC PEPTIDE: B Natriuretic Peptide: 16.5 pg/mL (ref 0.0–100.0)

## 2020-12-08 LAB — TROPONIN I (HIGH SENSITIVITY)
Troponin I (High Sensitivity): 4 ng/L (ref <18)
Troponin I (High Sensitivity): 9 ng/L (ref <18)

## 2020-12-08 MED ORDER — BACLOFEN 10 MG PO TABS: 10.0000 mg | ORAL_TABLET | ORAL | Status: DC

## 2020-12-08 MED ORDER — POLYETHYLENE GLYCOL 3350 17 G PO PACK: 17.0000 g | PACK | Freq: Every day | ORAL | Status: DC | PRN

## 2020-12-08 MED ORDER — SODIUM CHLORIDE 0.9% FLUSH
3.0000 mL | Freq: Two times a day (BID) | INTRAVENOUS | Status: DC
  Administered 2020-12-08 – 2020-12-11 (×4): 3 mL via INTRAVENOUS

## 2020-12-08 MED ORDER — BACLOFEN 10 MG PO TABS
15.0000 mg | ORAL_TABLET | ORAL | Status: DC
  Administered 2020-12-08 – 2020-12-11 (×7): 15 mg via ORAL
  Filled 2020-12-08 (×2): qty 2
  Filled 2020-12-08: qty 1
  Filled 2020-12-08 (×4): qty 2

## 2020-12-08 MED ORDER — ACETAMINOPHEN 325 MG PO TABS
650.0000 mg | ORAL_TABLET | Freq: Once | ORAL | Status: AC
  Administered 2020-12-08: 650 mg via ORAL
  Filled 2020-12-08: qty 2

## 2020-12-08 MED ORDER — BACLOFEN 10 MG PO TABS
10.0000 mg | ORAL_TABLET | ORAL | Status: DC
  Administered 2020-12-09 – 2020-12-11 (×5): 10 mg via ORAL
  Filled 2020-12-08 (×5): qty 1

## 2020-12-08 MED ORDER — CHLORHEXIDINE GLUCONATE CLOTH 2 % EX PADS
6.0000 | MEDICATED_PAD | Freq: Every day | CUTANEOUS | Status: DC
  Administered 2020-12-08 – 2020-12-11 (×4): 6 via TOPICAL

## 2020-12-08 MED ORDER — SODIUM CHLORIDE 0.9 % IV SOLN
2.0000 g | Freq: Three times a day (TID) | INTRAVENOUS | Status: DC
  Administered 2020-12-08 – 2020-12-10 (×7): 2 g via INTRAVENOUS
  Filled 2020-12-08 (×7): qty 2

## 2020-12-08 MED ORDER — VANCOMYCIN HCL 1000 MG/200ML IV SOLN: 1000.0000 mg | INTRAVENOUS | Status: DC

## 2020-12-08 MED ORDER — SODIUM CHLORIDE 0.9 % IV SOLN
2.0000 g | Freq: Once | INTRAVENOUS | Status: AC
  Administered 2020-12-08: 2 g via INTRAVENOUS
  Filled 2020-12-08: qty 2

## 2020-12-08 MED ORDER — VANCOMYCIN HCL 1500 MG/300ML IV SOLN
1500.0000 mg | Freq: Once | INTRAVENOUS | Status: AC
  Administered 2020-12-08: 1500 mg via INTRAVENOUS
  Filled 2020-12-08: qty 300

## 2020-12-08 MED ORDER — SODIUM CHLORIDE 0.9 % IV BOLUS
1000.0000 mL | Freq: Once | INTRAVENOUS | Status: AC
  Administered 2020-12-08: 1000 mL via INTRAVENOUS

## 2020-12-08 MED ORDER — TRAMADOL HCL 50 MG PO TABS: 50.0000 mg | ORAL_TABLET | Freq: Four times a day (QID) | ORAL | Status: DC | PRN

## 2020-12-08 MED ORDER — METRONIDAZOLE 500 MG/100ML IV SOLN
500.0000 mg | Freq: Once | INTRAVENOUS | Status: AC
  Administered 2020-12-08: 500 mg via INTRAVENOUS
  Filled 2020-12-08: qty 100

## 2020-12-08 MED ORDER — PANTOPRAZOLE SODIUM 40 MG PO TBEC
40.0000 mg | DELAYED_RELEASE_TABLET | Freq: Every day | ORAL | Status: DC
  Administered 2020-12-08 – 2020-12-11 (×4): 40 mg via ORAL
  Filled 2020-12-08 (×4): qty 1

## 2020-12-08 MED ORDER — LACTATED RINGERS IV BOLUS (SEPSIS)
500.0000 mL | Freq: Once | INTRAVENOUS | Status: AC
  Administered 2020-12-08: 500 mL via INTRAVENOUS

## 2020-12-08 MED ORDER — ACETAMINOPHEN 650 MG RE SUPP: 650.0000 mg | Freq: Four times a day (QID) | RECTAL | Status: DC | PRN

## 2020-12-08 MED ORDER — ALBUTEROL SULFATE HFA 108 (90 BASE) MCG/ACT IN AERS
1.0000 | INHALATION_SPRAY | Freq: Four times a day (QID) | RESPIRATORY_TRACT | Status: DC | PRN
  Filled 2020-12-08: qty 6.7

## 2020-12-08 MED ORDER — ENOXAPARIN SODIUM 40 MG/0.4ML IJ SOSY
40.0000 mg | PREFILLED_SYRINGE | Freq: Every day | INTRAMUSCULAR | Status: DC
  Administered 2020-12-08 – 2020-12-11 (×4): 40 mg via SUBCUTANEOUS
  Filled 2020-12-08 (×4): qty 0.4

## 2020-12-08 MED ORDER — ACETAMINOPHEN 325 MG PO TABS
650.0000 mg | ORAL_TABLET | Freq: Four times a day (QID) | ORAL | Status: DC | PRN
  Administered 2020-12-09 – 2020-12-10 (×3): 650 mg via ORAL
  Filled 2020-12-08 (×4): qty 2

## 2020-12-08 MED ORDER — LACTATED RINGERS IV SOLN: INTRAVENOUS | Status: AC

## 2020-12-08 MED ORDER — SERTRALINE HCL 50 MG PO TABS
50.0000 mg | ORAL_TABLET | Freq: Every day | ORAL | Status: DC
  Administered 2020-12-08 – 2020-12-11 (×4): 50 mg via ORAL
  Filled 2020-12-08 (×4): qty 1

## 2020-12-08 MED ORDER — HYDROXYCHLOROQUINE SULFATE 200 MG PO TABS
200.0000 mg | ORAL_TABLET | Freq: Every day | ORAL | Status: DC
  Administered 2020-12-08 – 2020-12-11 (×4): 200 mg via ORAL
  Filled 2020-12-08 (×4): qty 1

## 2020-12-08 NOTE — ED Notes (Signed)
Patient transported to CT 

## 2020-12-08 NOTE — Plan of Care (Signed)
  Problem: Education: Goal: Knowledge of General Education information will improve Description: Including pain rating scale, medication(s)/side effects and non-pharmacologic comfort measures Outcome: Progressing   Problem: Clinical Measurements: Goal: Will remain free from infection Outcome: Progressing   Problem: Skin Integrity: Goal: Risk for impaired skin integrity will decrease Outcome: Progressing

## 2020-12-08 NOTE — Progress Notes (Signed)
elink following sepsis 

## 2020-12-08 NOTE — H&P (Addendum)
Date: 12/08/2020               Patient Name:  [name] MRN: [id number]  DOB: 06/10/1951 Age / Sex: 70 y.o., female   PCP: Caprice Renshaw, MD         Medical Service: Internal Medicine Teaching Service         Attending Physician: Dr. Campbell Riches, MD    First Contact: Dr. Konrad Penta Pager: [phone]  Second Contact: Dr. Marva Panda Pager: [phone]       After Hours (After 5p/  First Contact Pager: [phone]  weekends / holidays): Second Contact Pager: [phone]   Chief Complaint: Diaphoresis  History of Present Illness:   [name] is a 70 y.o. 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 She states she woke up covered in sweat. She chronically struggles with diaphoresis from the waste down, although noted sweating from the waste up - unusual for her. She became nauseas and had many episodes of non-bloody vomiting. She did note acute SOB and worsening of her cough during this episode, although cough was dry, resolved quickly, and SOB has improved this morning. She has intentionally been trying to drink more water over the past 2 months to loosen her bowels and was concerned she was having a heart failure exacerbation as this is how she felt the last time she was admitted for this. She did note diffuse "cramping" abdominal pain last night and looser stools over the past 2 months. She does not have feeling from the waste down so cannot tell if she's had dysuria, but denies flank pain or hematuria. She denies frequent history of UTI's although does self-cath herself every 4  hours at home. She follows with a urologist and notes she has an appointment this coming Tuesday to discuss possible Botox injections for her neurogenic bladder. She denies any other symptoms including CP, palpitations, constipation, LE swelling, wounds (outside of a "knick" she received from podiatry), or any other symptoms.   Meds:  Current Meds  Medication Sig  . acetaminophen (TYLENOL) 325 MG tablet Take 650 mg by mouth every 6 (six) hours as needed for mild pain or fever.  Marland Kitchen albuterol (VENTOLIN HFA) 108 (90 Base) MCG/ACT inhaler Inhale 2 puffs into the lungs every 4 (four) hours as needed for wheezing or shortness of breath.  Marland Kitchen alum & mag hydroxide-simeth (MAALOX PLUS) 400-400-40 MG/5ML suspension Take 10 mLs by mouth every 6 (six) hours as needed for indigestion (nausea, Gas).  . baclofen (LIORESAL) 10 MG tablet Take 10-15 mg by mouth See admin instructions. 10 mg  bid at 6 am and 12 pm 15 mg bid at 12 am and 6 pm  . Benzocaine 10 MG LOZG Use as directed 1 lozenge in the mouth or throat every 2 (two) hours as needed.  . bisacodyl (DULCOLAX) 10 MG suppository Place 1 suppository (10 mg total) rectally every Tuesday, Thursday, Saturday, and Sunday at 6 PM.  . Brimonidine Tartrate (LUMIFY) 0.025 % SOLN Place 1 drop into both eyes daily as needed (redness).  . calcium-vitamin D (OSCAL WITH D) 500-200 MG-UNIT tablet  Take 1 tablet by mouth 2 (two) times daily.   . Carboxymethylcellulose Sod PF 0.5 % SOLN Place 1 drop into both eyes in the morning, at noon, in the evening, and at bedtime.  . Carboxymethylcellulose Sod PF 0.5 % SOLN Place 1 drop into both eyes every 6 (six) hours as needed (comfort).  . carvedilol (COREG) 12.5 MG tablet Take 1 tablet (12.5 mg total) by mouth 2 (two) times daily with a meal.  . carvedilol (COREG) 6.25 MG tablet Take 1 tablet (6.25 mg total) by mouth 2 (two) times daily. Take with 12.5 to make 18.75 twice daily.  . cetirizine (ZYRTEC) 10 MG tablet Take 10 mg by mouth  daily as needed for allergies.   . cholecalciferol (VITAMIN D) 1000 units tablet Take 1,000 Units by mouth daily.  . Cranberry 450 MG TABS Take 450 mg by mouth daily.  . diclofenac Sodium (VOLTAREN) 1 % GEL Apply 2 g topically in the morning, at noon, and at bedtime.  . docusate sodium (COLACE) 100 MG capsule Take 100 mg by mouth in the morning, at noon, and at bedtime.  . furosemide (LASIX) 40 MG tablet Take 40 mg by mouth 2 (two) times daily.   . hydrocortisone 2.5 % cream Apply 1 application topically 2 (two) times daily as needed (itching). To arms and legs  . hydroxychloroquine (PLAQUENIL) 200 MG tablet Take 200 mg by mouth daily.   Marland Kitchen linaclotide (LINZESS) 290 MCG CAPS capsule Take 290 mcg by mouth daily before breakfast.  . losartan (COZAAR) 25 MG tablet Take 25 mg by mouth daily.  . Melatonin 3 MG TBDP Take 3 mg by mouth at bedtime as needed (sleeo).  . Multiple Vitamin (MULTIVITAMIN WITH MINERALS) TABS tablet Take 1 tablet by mouth daily.  Marland Kitchen MYRBETRIQ 50 MG TB24 tablet Take 50 mg by mouth daily.  Marland Kitchen omeprazole (PRILOSEC) 20 MG capsule Take 20 mg by mouth 2 (two) times daily.  Marland Kitchen oxybutynin (DITROPAN-XL) 5 MG 24 hr tablet Take 5 mg by mouth daily.  . polyethylene glycol (MIRALAX / GLYCOLAX) packet Take 17 g by mouth every Monday, Wednesday, Friday, and Saturday at 6 PM. (Patient taking differently: Take 17 g by mouth 3 (three) times a week. Monday, Wednesday & Friday)  . Prenatal Vit-Fe Fumarate-FA (PRENATAL MULTIVITAMIN) TABS tablet Take 1 tablet by mouth daily at 12 noon.  . sennosides-docusate sodium (SENOKOT-S) 8.6-50 MG tablet Take 2 tablets by mouth 2 (two) times daily. (Patient taking differently: Take 2 tablets by mouth 4 (four) times daily.)  . sertraline (ZOLOFT) 50 MG tablet Take 50 mg by mouth daily.  Marland Kitchen spironolactone (ALDACTONE) 100 MG tablet Take 1 tablet (100 mg total) by mouth daily. For edema  . sucralfate (CARAFATE) 1 GM/10ML suspension Take 1 g by mouth every 6 (six) hours  as needed (for [place]).   Marland Kitchen tiZANidine (ZANAFLEX) 4 MG tablet Take 4 mg by mouth 3 (three) times daily. For Muscle spasms  . traMADol (ULTRAM) 50 MG tablet Take 1 tablet (50 mg total) by mouth every 6 (six) hours as needed. (Patient taking differently: Take 50 mg by mouth every 6 (six) hours as needed for moderate pain.)   Allergies: Allergies as of 12/08/2020  . (No Known Allergies)   Past Medical History:  Diagnosis Date  . Acute systolic CHF (congestive heart failure) ([place]) 06/28/2017   EF was normal 2016, now 20% with grade 2 diastolic dysfunction, no significant CAD at cath  . Anemia 10/12/2008  . CKD (chronic kidney  disease)   . Depression   . Diaphragmatic hernia without mention of obstruction or gangrene 08/29/2010  . Edema 05/20/2009  . Gastroparesis 10/05/2010  . GERD (gastroesophageal reflux disease) 02/02/2009  . Hx of colonic polyps   . Hypertension 10/12/2008  . Hypotension, unspecified 05/20/2009  . Immobility syndrome (paraplegic) 1977  . Iron deficiency anemia, unspecified 10/12/2008  . Leiomyoma of uterus   . Malignant neoplasm of breast (female), unspecified site 10/05/2010   2003, 2013   . Paraplegia ([place])   . Pleural effusion   . Pneumonia   . Respiratory failure ([place])   . Rheumatoid arthritis ([place]) 01/17/2009   Family History:  Family History  Problem Relation Age of Onset  . Stroke Mother   . Hypertension Mother   . Hypertension Maternal Aunt   . Colon cancer Maternal Aunt   . Hypertension Maternal Uncle   . Liver disease Brother   . Prostate cancer Maternal Uncle   . Liver disease Brother    Social History:  Patient has had a spinal cord injury 44 years ago and resides in SNF. Prior history of tobacco use >40 yrs ago, 1PPD. No history of alcohol use or illicit drug use.  Review of Systems: A complete ROS was negative except as per HPI.   Physical Exam: Blood pressure 102/69, pulse 90, temperature 98.9 F (37.2 C), temperature source Oral,  resp. rate 20, SpO2 100 %.  General: Patient was initially sleeping, resting comfortable in no acute distress.  Eyes: Sclera non-icteric. No conjunctival injection.  HENT: Mucus membranes are slightly dry. No nasal discharge. Respiratory: Lungs are CTA, bilaterally. No wheezes, rales, or rhonchi. Normal work of breathing on room air.  Cardiovascular: Regular rate and rhythm. No murmurs, rubs, or gallops. No lower extremity edema. Extremities are cold to the touch, bilaterally.  Neurological: Patient awoke easily. Alert and oriented x 3. Patient has absent sensation to light touch below the umbilicus. MSK: She has bilateral LE contractures although extremities are not tender to palpation or with passive ROM.  Abdominal: Abdomen is obese and appears distended although is soft, without tenderness to palpation. Bowel sounds are intact throughout. No guarding or rebound. No palpable masses.  Skin: No lesions. No rashes. There is significant midline scarring over her thoracolumbar spine.  Psych: Normal affect. Pleasant and cooperative.   EKG: personally reviewed my interpretation is sinus tachycardia at 112 bpm with normal PR intervals and likely normal QTc intervals, although biphasic waves make this difficult to interpret in setting of tachycardia. There are T wave inversions in I, aVL, V2-V6 without significant ST changes, consistent with prior EKG. Not concerning for acute ischemia.   CXR: personally reviewed my interpretation is low long volumes with right hemidiaphragm elevation with mild pulmonary edema but no pleural effusions, focal opacification, or other acute findings.    CT CHEST/ABDOMEN/PELVIS wo Contrast: CT ABDOMEN PELVIS FINDINGS Hepatobiliary: History of cirrhosis and right liver mass which measures 2 cm by noncontrast technique.Cholecystectomy. Pancreas: Unremarkable. 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 Adrenals/Urinary  Tract: Negative adrenals. No hydronephrosis or stone. Unremarkable bladder. Stomach/Bowel: No bowel obstruction or visible inflammation. There is a degree of and a rectal prolapse. Vascular/Lymphatic: No acute vascular abnormality. No mass or adenopathy. Reproductive:Exophytic uterine fibroids including a heavily calcified right-sided fibroid measuring 8.5 cm. Other: No ascites or pneumoperitoneum. Musculoskeletal: No acute abnormalities. Prior sacral decubitus ulcer with soft tissue completely overlapping the mildly eroded the sacrum. Extensive lumbar fusion/ankylosis.  IMPRESSION: 1.  No acute finding or explanation for fever. 2. Multiple chronic findings are noted above.  Electronically Signed   By: Monte Fantasia M.D.   On: 12/08/2020 08:37  Assessment & Plan by Problem: Active Problems:   Sepsis secondary to UTI [facility])  # Sepsis 2/2 Complicated Cystitis  Her acute overnight symptoms are likely in the setting of complicated cystitis related to self-catheterization for her neurogenic bladder. When she first presented to the ED, she was febrile, tachycardic and tachypneic with coarse lung sounds. WBC was normal. Urinalysis showed large leukocytes, nitrites, few bacteria, >50 WBC's w/ clumping, small Hgb, 30 protein, and 6-10 RBC's. Lactic acid was 3.4 in the ED, improved to 2.5 s/p small fluid bolus. Lipase was normal. CT chest/abdomen/pelvis were unremarkable without concern for pulmonary infection. She was started on cefepime, vancomycin, and metronidazole and a foley catheter was placed. Note - she did not receive full 30cc's/kg fluid repletion acutely due to concern for severe CHF. On my examination, her lungs were CTA, her tachycardia and tachypnea resolved, and she was asymptomatic without CVA tenderness (although this may be due to paraplegia) without concerning skin findings to suggest alternative source of infection.  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 She has been reportedly drinking more water over the past several months with acute-onset SOB and dry cough overnight. Her last ECHO showed EF of 20-25% with grade 2 diastolic dysfunction, moderate MR< and moderate increased pulmonary systolic pressures (to 58IFOY). However, today she denies orthopnea, says her respiratory symptoms have resolved, denies chest pain, and clinically appears dry if anything. Troponins were flat at 4 > 9 and BNP was normal at 16.5. CXR possibly with very mild pulmonary edema although without effusions. She takes Lasix 36m BID, Losartan 277AJ and Coreg (uncertain dose) BID at her facility without missing doses.  - Will hold above medications for now given sepsis  - Check TSH  - Strict I&O  - Daily weights  - Fluid status will require close monitoring, especially on IVF's.   # Cirrhosis with Chronic HCV and HCC (Diagnosed 2021) Patient has a 2cm right liver mass, minimally larger than on previous imaging. [place] follow up with oncology ~June. MELD score is 6, Child-Pugh A. She endorses some fatigue although denies recent bleeding and labs are largely unremarkable.  - Check morning CMP  - Continue outpatient follow up  # Gastroparesis  # Hiatal Hernia  She did have N/V and abdominal discomfort overnight with abdominal bloating on exam, although this may be in the setting of acute UTI. She takes Maalox q6  hours PRN, Carafate 1g q6 hours PRN, Protonix 296mBID and Linzess daily as an outpatient and has seen Dr. ArHavery Morosn the past.  - Will start Protonix 4083maily  - Will continue monitoring for now and restart home medications as needed - PRN Zofran if EKG does not  show significant QTc prolongation  # Hx of Pre-DM w/ Hyperglycemia  - Check Hgb A1c   # Hx of Constipation  Patient has had more frequent loose stools over the past couple of months but says she does have a history of constipation for which she takes Miralax 3x weekly, Senokot BID, Colace 169m BID and a dulcolax suppository every other night. It is unclear if she has been receiving these more recently.  - Miralax daily PRN   # Hx of RA  # Paraplegia  - Continue home Plaquenil 2073mdaily  - Tylenol 65059m6 hours PRN for mild pain  - Continue home tramadol 41m70m hours PRN for moderate pain  - Baclofen 10mg95mce daily   # Depression  - Continue home Sertraline 41mg 35my   Code Status: Full Code  Diet: Heart Healthy  DVT PPx: Lovenox 40mg d66m  IVF: LR @ 75mL/hr60mispo: Admit patient to Observation with expected length of stay less than 2 midnights.  Signed: SpeakmanJeralyn Bennett5/2022, 3:19 PM  Pager: [phone] on weekdays and 1pm on weekends: On Call pager: [phone]

## 2020-12-08 NOTE — Progress Notes (Signed)
Pharmacy Antibiotic Note  [name] is a 70 y.o. female admitted on 12/08/2020 with fever concerning for infection of unknown source.  Pharmacy has been consulted for Cefepime and vancomycin dosing.  WBC wnl, LA 3.4, SCr wnl. Tm 103.85F  Plan: -Cefepime 2 gm IV Q 8 hours -Vancomycin 1500 mg IV load followed by Vancomycin 1000 mg IV Q 24 hrs. Goal AUC 400-550. Expected AUC: 515 SCr used: 0.85 -Monitor CBC, renal fx, cultures and clinical progress -Vancomycin levels as indicated      Temp (24hrs), Avg:101.1 F (38.4 C), Min:98.9 F (37.2 C), Max:103.3 F (39.6 C)  Recent Labs  Lab 12/08/20 0641 12/08/20 0748  WBC 5.7  --   CREATININE 0.85  --   LATICACIDVEN  --  3.4*    CrCl cannot be calculated (Unknown ideal weight.).    No Known Allergies  Antimicrobials this admission: Cefepime 5/15 >>  Vancomycin 5/15 >>   Dose adjustments this admission:  Microbiology results: 5/15 BCx:  5/15 UCx:     Thank you for allowing pharmacy to be a part of this patient's care.  Albertina Parr, PharmD., BCPS, BCCCP Clinical Pharmacist Please refer to [facility] for unit-specific pharmacist

## 2020-12-08 NOTE — ED Provider Notes (Signed)
[name] Provider Note   CSN: [id number] Arrival date & time: 12/08/20  0541     History Chief Complaint  Patient presents with  . Nausea    [name] is a 70 y.o. female with extensive past medical history, see below who presents for evaluation of fever. She states feeling unwell since yesterday. Cough over last 2 days.  Productive of clear sputum.  She is vaccinated for COVID.  No sick contacts.  She denies any chest pain.  Feels short of breath.  History of heart failure.  She is unsure if she is on diuretic.  No congestion, rhinorrhea.  Does not wear oxygen at baseline.  She does not think she has had any lower extremity edema however is unsure due to history of paraplegia x40 years.  States she has had some mild nausea and 2 episodes of NBNB emesis.  She does not feel any pain in her abdomen due to her paraplegia.  States she gets cath UA frequently due to neurogenic bladder.  Her last bladder catheterization was at midnight. Started shivering this morning.  She feels like she has a fever.  She is unsure when her last bowel movement was.  Patient states she gets rectal suppositories every other day to produce a bowel movement. Has not been told she had blood in stool by facility. She does not feel like her abdomen is more distended than normal.  She has not noted any new rashes or lesions. Feels generally weak.  Denies additional aggravating or alleviating factors.  Rates her current pain is 0/10.  Hx of possible bacteremia on prior BC. Seen by ID (Dr. Gale Journey) thought possible contamination however treated with Vanc x 10 days given risk factors.  Has appointment with Urology on Tuesday for FU of chronic urinary issues    History obtained from patient and past medical records.  No interpreter used   Per chart review>> Hx of hepatocellular carcinoma dx 12/21, Supposed to be cath for urine q 4-6 hours   GI>> [name]  PCP>>  Nursing facility Dr. Judi Cong    HPI     Past Medical History:  Diagnosis Date  . Acute systolic CHF (congestive heart failure) ([place]) 06/28/2017   EF was normal 2016, now 20% with grade 2 diastolic dysfunction, no significant CAD at cath  . Anemia 10/12/2008  . CKD (chronic kidney disease)   . Depression   . Diaphragmatic hernia without mention of obstruction or gangrene 08/29/2010  . Edema 05/20/2009  . Gastroparesis 10/05/2010  . GERD (gastroesophageal reflux disease) 02/02/2009  . Hx of colonic polyps   . Hypertension 10/12/2008  . Hypotension, unspecified 05/20/2009  . Immobility syndrome (paraplegic) 1977  . Iron deficiency anemia, unspecified 10/12/2008  . Leiomyoma of uterus   . Malignant neoplasm of breast (female), unspecified site 10/05/2010   2003, 2013   . Paraplegia ([place])   . Pleural effusion   . Pneumonia   . Respiratory failure ([place])   . Rheumatoid arthritis ([place]) 01/17/2009    Patient Active Problem List   Diagnosis Date Noted  . Spasticity 12/06/2020  . Contracture of lower leg joint 12/06/2020  . Autonomic dysfunction 08/30/2020  . Neurogenic bowel 08/30/2020  . Wheelchair dependence 08/30/2020  . 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 Medication management 06/07/2018  . Cough 06/07/2018  . Cirrhosis ([name]) 04/20/2018  . Obstipation/fecal impaction 02/12/2018  . Intractable vomiting 02/11/2018  . Chronic hepatitis C without hepatic  coma ([name]) 01/10/2018  . NICM (nonischemic cardiomyopathy) ([name]) 08/25/2017  . Normal coronary arteries 08/25/2017  . Diastolic dysfunction 50/27/7412  . S/P thoracentesis   . Status post thoracentesis   . S/p percutaneous right heart catheterization   . Pleural effusion on right 06/29/2017  . Respiratory failure with hypoxia ([place]) 06/29/2017  . Pressure injury of skin 06/29/2017  . Hypokalemia 09/21/2016  . Fever in adult 07/28/2016  . Decubitus ulcer of coccyx 02/04/2016  . Hyperglycemia  12/03/2015  . H/O bilateral mastectomy 07/25/2015  . Benign hypertensive heart disease without heart failure 07/15/2015  . Hiatal hernia 07/06/2015  . 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 Pleural effusion 07/06/2015  . History of breast cancer 08/21/2014  . Adynamic ileus ([name]) 07/22/2014  . Elevated liver enzymes 07/22/2014  . CHF (congestive heart failure) ([name]) 07/05/2014  . Gastroparesis 07/05/2014  . Microcytic anemia 01/05/2014  . Insomnia 07/14/2013  . Edema 05/01/2013  . Rheumatoid arthritis ([name]) 02/23/2013  . GERD (gastroesophageal reflux disease) 02/23/2013  . Essential hypertension 02/23/2013  . Neurogenic bladder 02/23/2013  . Paraplegia ([name]) 02/23/2013  . Depression 02/23/2013  . Allergic rhinitis 02/23/2013  . Constipation 02/23/2013  . 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[place]) 09/24/2011  . Breast cancer, left breast ([name]) 09/08/2011    Past Surgical History:  Procedure Laterality Date  . [place]  . BREAST SURGERY  2003   Bilateral mastectomy  . IR FLUORO GUIDE CV LINE RIGHT  06/23/2017  . IR REMOVAL OF PLURAL CATH W/CUFF  02/14/2018  . IR REMOVAL TUN CV CATH W/O FL  07/15/2017  . IR THORACENTESIS ASP PLEURAL SPACE W/IMG GUIDE  07/30/2017  . IR US GUIDE VASC ACCESS RIGHT  06/23/2017  . PRESSURE ULCER DEBRIDEMENT  2009   on back  . RIGHT/LEFT HEART CATH AND CORONARY ANGIOGRAPHY N/A 07/02/2017   Procedure: RIGHT/LEFT HEART CATH AND CORONARY ANGIOGRAPHY;  Surgeon: Martinique, Peter M, MD;  Location: [facility];  Service: Cardiovascular;  Laterality: N/A;  . WOUND DEBRIDEMENT  09/02/2008   Large sacral back open wound     OB History   No obstetric history on file.     Family History  Problem Relation Age of Onset  . Stroke Mother   . Hypertension Mother   . Hypertension Maternal Aunt   . Colon cancer Maternal Aunt   . Hypertension Maternal Uncle   . Liver disease Brother   . Prostate cancer  Maternal Uncle   . Liver disease Brother     Social History   Tobacco Use  . Smoking status: Former Smoker    Packs/day: 1.00    Years: 10.00    Pack years: 10.00    Quit date: 07/28/1995    Years since quitting: 25.3  . Smokeless tobacco: Never Used  Vaping Use  . Vaping Use: Never used  Substance Use Topics  . Alcohol use: No  . Drug use: No    Home Medications Prior to Admission medications   Medication Sig Start Date End Date Taking? Authorizing Provider  acetaminophen (TYLENOL) 325 MG tablet Take 650 mg by mouth every 6 (six) hours as needed for mild pain or fever.   Yes [provider]  albuterol (VENTOLIN HFA) 108 (90 Base) MCG/ACT inhaler Inhale 2 puffs into the lungs every 4 (four) hours as needed for wheezing or shortness of breath.   Yes [provider]  alum & mag hydroxide-simeth (MAALOX  PLUS) 400-400-40 MG/5ML suspension Take 10 mLs by mouth every 6 (six) hours as needed for indigestion (nausea, Gas).   Yes [provider]  baclofen (LIORESAL) 10 MG tablet Take 10-15 mg by mouth See admin instructions. 10 mg  bid at 6 am and 12 pm 15 mg bid at 12 am and 6 pm   Yes [provider]  Benzocaine 10 MG LOZG Use as directed 1 lozenge in the mouth or throat every 2 (two) hours as needed.   Yes [provider]  bisacodyl (DULCOLAX) 10 MG suppository Place 1 suppository (10 mg total) rectally every Tuesday, Thursday, Saturday, and Sunday at 6 PM. 02/15/18  Yes Emokpae, Courage, MD  Brimonidine Tartrate (LUMIFY) 0.025 % SOLN Place 1 drop into both eyes daily as needed (redness).   Yes [provider]  calcium-vitamin D (OSCAL WITH D) 500-200 MG-UNIT tablet Take 1 tablet by mouth 2 (two) times daily.    Yes [provider]  Carboxymethylcellulose Sod PF 0.5 % SOLN Place 1 drop into both eyes in the morning, at noon, in the evening, and at bedtime.   Yes [provider]  Carboxymethylcellulose Sod PF 0.5 % SOLN  Place 1 drop into both eyes every 6 (six) hours as needed (comfort).   Yes [provider]  carvedilol (COREG) 12.5 MG tablet Take 1 tablet (12.5 mg total) by mouth 2 (two) times daily with a meal. 06/07/18  Yes Minus Breeding, MD  carvedilol (COREG) 6.25 MG tablet Take 1 tablet (6.25 mg total) by mouth 2 (two) times daily. Take with 12.5 to make 18.75 twice daily. 08/09/18  Yes Minus Breeding, MD  cetirizine (ZYRTEC) 10 MG tablet Take 10 mg by mouth daily as needed for allergies.    Yes [provider]  cholecalciferol (VITAMIN D) 1000 units tablet Take 1,000 Units by mouth daily.   Yes [provider]  Cranberry 450 MG TABS Take 450 mg by mouth daily.   Yes [provider]  diclofenac Sodium (VOLTAREN) 1 % GEL Apply 2 g topically in the morning, at noon, and at bedtime. 07/16/20  Yes [provider]  docusate sodium (COLACE) 100 MG capsule Take 100 mg by mouth in the morning, at noon, and at bedtime.   Yes [provider]  furosemide (LASIX) 40 MG tablet Take 40 mg by mouth 2 (two) times daily.    Yes [provider]  hydrocortisone 2.5 % cream Apply 1 application topically 2 (two) times daily as needed (itching). To arms and legs   Yes [provider]  hydroxychloroquine (PLAQUENIL) 200 MG tablet Take 200 mg by mouth daily.    Yes [provider]  linaclotide (LINZESS) 290 MCG CAPS capsule Take 290 mcg by mouth daily before breakfast.   Yes [provider]  losartan (COZAAR) 25 MG tablet Take 25 mg by mouth daily.   Yes [provider]  Melatonin 3 MG TBDP Take 3 mg by mouth at bedtime as needed (sleeo).   Yes [provider]  Multiple Vitamin (MULTIVITAMIN WITH MINERALS) TABS tablet Take 1 tablet by mouth daily.   Yes [provider]  MYRBETRIQ 50 MG TB24 tablet Take 50 mg by mouth daily. 11/24/20  Yes [provider]  omeprazole (PRILOSEC) 20 MG capsule Take 20 mg by mouth  2 (two) times daily.   Yes [provider]  oxybutynin (DITROPAN-XL) 5 MG 24 hr tablet Take 5 mg by mouth daily. 12/04/20  Yes [provider]  polyethylene glycol (MIRALAX / GLYCOLAX) packet Take 17 g by mouth every Monday, Wednesday, Friday, and Saturday at 6 PM. Patient taking differently: Take 17 g by mouth 3 (three) times a week. Monday, Wednesday & Friday 02/14/18  Yes Roxan Hockey, MD  Prenatal Vit-Fe Fumarate-FA (PRENATAL MULTIVITAMIN) TABS tablet Take 1 tablet by mouth daily at 12 noon.   Yes [provider]  sennosides-docusate sodium (SENOKOT-S) 8.6-50 MG tablet Take 2 tablets by mouth 2 (two) times daily. Patient taking differently: Take 2 tablets by mouth 4 (four) times daily. 02/14/18  Yes Emokpae, Courage, MD  sertraline (ZOLOFT) 50 MG tablet Take 50 mg by mouth daily. 12/07/20  Yes [provider]  spironolactone (ALDACTONE) 100 MG tablet Take 1 tablet (100 mg total) by mouth daily. For edema 02/14/18  Yes Emokpae, Courage, MD  sucralfate (CARAFATE) 1 GM/10ML suspension Take 1 g by mouth every 6 (six) hours as needed (for [place]).  04/24/19  Yes Armbruster, Carlota Raspberry, MD  tiZANidine (ZANAFLEX) 4 MG tablet Take 4 mg by mouth 3 (three) times daily. For Muscle spasms   Yes [provider]  traMADol (ULTRAM) 50 MG tablet Take 1 tablet (50 mg total) by mouth every 6 (six) hours as needed. Patient taking differently: Take 50 mg by mouth every 6 (six) hours as needed for moderate pain. 02/14/18  Yes Roxan Hockey, MD    Allergies    Patient has no known allergies.  Review of Systems   Review of Systems  Constitutional: Positive for fatigue and fever.  HENT: Negative.   Respiratory: Positive for cough and shortness of breath. Negative for wheezing and stridor.   Cardiovascular: Negative.   Gastrointestinal: Positive for nausea and vomiting. Negative for abdominal distention, abdominal pain, anal bleeding, blood in stool, constipation, diarrhea  and rectal pain.  Genitourinary: Positive for difficulty urinating (Baseline). Negative for frequency.  Musculoskeletal: Negative.   Skin: Negative.   Neurological: Positive for weakness. Negative for dizziness, tremors, light-headedness, numbness and headaches.  All other systems reviewed and are negative.   Physical Exam Updated Vital Signs BP (!) 118/58   Pulse (!) 108   Temp 98.9 F (37.2 C) (Oral)   Resp 14   SpO2 97%   Physical Exam Vitals and nursing note reviewed. Exam conducted with a chaperone present.  Constitutional:      General: She is not in acute distress.    Appearance: She is well-developed. She is obese. She is ill-appearing. She is not diaphoretic.     Comments: Pleasant chronically ill appearing female who appears to not feel well  HENT:     Head: Normocephalic and atraumatic.     Nose: Nose normal.     Mouth/Throat:     Mouth: Mucous membranes are dry.  Eyes:     Pupils: Pupils are equal, round, and reactive to light.  Cardiovascular:     Rate and Rhythm: Tachycardia present.     Pulses:          Radial pulses are 2+ on the right side and 2+ on the left side.       Dorsalis pedis pulses are 1+ on the right side and 1+ on the left side.     Heart sounds: Normal heart sounds.  Pulmonary:     Effort: No respiratory distress.     Comments: Course lung sounds. Speaks in full sentences without difficulty Chest:     Comments: Equal rise and fall to chest wall. Mastectomy scars present Abdominal:  General: Bowel sounds are decreased. There is distension.     Palpations: Abdomen is soft.     Tenderness: There is no abdominal tenderness.     Hernia: No hernia is present.     Comments: Distended, non tender. No fluid wave  Genitourinary:    Comments: External hemorrhoids presents. No gross bleeding. Musculoskeletal:        General: Normal range of motion.     Cervical back: Normal range of motion.     Comments: Compartments soft. Knees flexed  bilaterally. No obvious traumatic bony injuries  Skin:    General: Skin is warm and dry.     Capillary Refill: Capillary refill takes less than 2 seconds.     Comments: Large midline thoracic and lumbar scarring. No surrounding erythema, warmth, drainage. No rashes or lesions. No active stagable sacral ulcerations  Neurological:     General: No focal deficit present.     Mental Status: She is alert and oriented to person, place, and time.     Comments: Does not move lower extremities at baseline Unable to ambulate at baseline No facial droop Moves Upper extremities wo difficulty    ED Results / Procedures / Treatments   Labs (all labs ordered are listed, but only abnormal results are displayed) Labs Reviewed  LACTIC ACID, PLASMA - Abnormal; Notable for the following components:      Result Value   Lactic Acid, Venous 3.4 (*)    All other components within normal limits  COMPREHENSIVE METABOLIC PANEL - Abnormal; Notable for the following components:   CO2 21 (*)    Glucose, Bld 128 (*)    All other components within normal limits  CBC WITH DIFFERENTIAL/PLATELET - Abnormal; Notable for the following components:   RBC 5.38 (*)    Lymphs Abs 0.5 (*)    All other components within normal limits  APTT - Abnormal; Notable for the following components:   aPTT 20 (*)    All other components within normal limits  URINALYSIS, ROUTINE W REFLEX MICROSCOPIC - Abnormal; Notable for the following components:   APPearance HAZY (*)    Hgb urine dipstick SMALL (*)    Protein, ur 30 (*)    Nitrite POSITIVE (*)    Leukocytes,Ua LARGE (*)    WBC, UA >50 (*)    Bacteria, UA FEW (*)    All other components within normal limits  RESP PANEL BY RT-PCR (FLU A&B, COVID) ARPGX2  CULTURE, BLOOD (ROUTINE X 2)  CULTURE, BLOOD (ROUTINE X 2)  URINE CULTURE  PROTIME-INR  LIPASE, BLOOD  BRAIN NATRIURETIC PEPTIDE  LACTIC ACID, PLASMA  TROPONIN I (HIGH SENSITIVITY)  TROPONIN I (HIGH SENSITIVITY)     EKG EKG Interpretation  Date/Time:  Sunday Dec 08 2020 05:50:48 EDT Ventricular Rate:  112 PR Interval:  157 QRS Duration: 87 QT Interval:  309 QTC Calculation: 422 R Axis:   1 Text Interpretation: Sinus tachycardia Abnormal R-wave progression, early transition Repol abnrm suggests ischemia, anterolateral No significant change since last tracing Confirmed by Gareth Morgan [phone]) on 12/08/2020 8:41:03 AM   Radiology DG Chest Port 1 View  Result Date: 12/08/2020 CLINICAL DATA:  Questionable sepsis. Vomiting with increased work of breathing. EXAM: PORTABLE CHEST 1 VIEW COMPARISON:  06/13/2020 FINDINGS: Heart size and mediastinal contours are unremarkable. Asymmetric elevation of right hemidiaphragm is unchanged. No pleural effusion or edema. No airspace densities identified. Review of the visualized osseous structures is unremarkable. IMPRESSION: No acute cardiopulmonary abnormalities. Electronically Signed   By:  Kerby Moors [name].D.   On: 12/08/2020 07:07   CT CHEST ABDOMEN PELVIS WO CONTRAST  Result Date: 12/08/2020 CLINICAL DATA:  Abdominal pain with fever and cough in EXAM: CT CHEST, ABDOMEN AND PELVIS WITHOUT CONTRAST TECHNIQUE: Multidetector CT imaging of the chest, abdomen and pelvis was performed following the standard protocol without IV contrast. COMPARISON:  Abdomen and pelvis CT 06/13/2020 FINDINGS: CT CHEST FINDINGS Cardiovascular: Normal heart size. No pericardial effusion. Coronary and aortic atherosclerosis. Mediastinum/Nodes: Moderate sliding hiatal hernia. No adenopathy or mass. Lungs/Pleura: Atelectatic type opacity mainly at the right base. No consolidation, edema, effusion, or pneumothorax. Musculoskeletal: Large chronic soft tissue loss in the left paramedian back which extends to the spine and ribs where there is chronic osseous changes. Remote T8 fracture with T7-8 anterolisthesis and kyphosis causing severe spinal stenosis and cord transection by MRI. CT ABDOMEN  PELVIS FINDINGS Hepatobiliary: History of cirrhosis and right liver mass which measures 2 cm by noncontrast technique.Cholecystectomy. Pancreas: Unremarkable. 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 Adrenals/Urinary Tract: Negative adrenals. No hydronephrosis or stone. Unremarkable bladder. Stomach/Bowel: No bowel obstruction or visible inflammation. There is a degree of and a rectal prolapse. Vascular/Lymphatic: No acute vascular abnormality. No mass or adenopathy. Reproductive:Exophytic uterine fibroids including a heavily calcified right-sided fibroid measuring 8.5 cm. Other: No ascites or pneumoperitoneum. Musculoskeletal: No acute abnormalities. Prior sacral decubitus ulcer with soft tissue completely overlapping the mildly eroded the sacrum. Extensive lumbar fusion/ankylosis. IMPRESSION: 1. No acute finding or explanation for fever. 2. Multiple chronic findings are noted above. Electronically Signed   By: Monte Fantasia [name].D.   On: 12/08/2020 08:37     Last ECHO 12/18  EF 20-25 %  Cath>> Normal Coronaries      Procedures .Critical Care Performed by: Nettie Elm, PA-C Authorized by: Nettie Elm, PA-C   Critical care provider statement:    Critical care time (minutes):  45   Critical care was necessary to treat or prevent imminent or life-threatening deterioration of the following conditions:  Sepsis   Critical care was time spent personally by me on the following activities:  Discussions with consultants, evaluation of patient's response to treatment, examination of patient, ordering and performing treatments and interventions, ordering and review of laboratory studies, ordering and review of radiographic studies, pulse oximetry, re-evaluation of patient's condition, obtaining history from patient or surrogate and review of old charts     Medications Ordered in ED Medications  lactated ringers infusion ( Intravenous New  Bag/Given 12/08/20 0814)  vancomycin (VANCOREADY) IVPB 1000 mg/200 mL (has no administration in time range)  ceFEPIme (MAXIPIME) 2 g in sodium chloride 0.9 % 100 mL IVPB (has no administration in time range)  lactated ringers bolus 500 mL (0 mLs Intravenous Stopped 12/08/20 0810)  ceFEPIme (MAXIPIME) 2 g in sodium chloride 0.9 % 100 mL IVPB (0 g Intravenous Stopped 12/08/20 0810)  metroNIDAZOLE (FLAGYL) IVPB 500 mg (0 mg Intravenous Stopped 12/08/20 0932)  vancomycin (VANCOREADY) IVPB 1500 mg/300 mL (0 mg Intravenous Stopped 12/08/20 1117)  acetaminophen (TYLENOL) tablet 650 mg (650 mg Oral Given 12/08/20 0722)  sodium chloride 0.9 % bolus 1,000 mL (0 mLs Intravenous Stopped 12/08/20 1031)    ED Course  I have reviewed the triage vital signs and the nursing notes.  Pertinent labs & imaging results that were available during my care of the patient were reviewed by me and considered in my medical decision making (see chart for details).  70 year old chronically ill  patient presents for fever cough.  On arrival patient is febrile, tachycardic, tachypneic.  She has coarse lung sounds.  Code sepsis called.  Does get frequent cath UA for neurogenic bladder.  She has some mild abdominal distention with history of heart failure and cirrhosis.  She does not appear fluid overloaded to her lower extremities.  I do not appreciate any rashes, lesions, any forms of cellulitis or abscess.  Broad-spectrum antibiotics given.  Small fluid bolus.  Plan on labs, imaging and reassess.  Labs and imaging personally reviewed and interpreted:  CBC without leukocytosis CMP hyperglycemia 128 no additional electrolyte, renal or liver abnormality Lipase 39 BNP 16.5 Trop 4 BC pending  INR 1.0 UA POSITIVE for UTI on cath UA, culture sent COVID/Flu Negative DG chest wo significant abnormality EKG without ischemic changes. Sinus tachycardia CT C/A/P without acute abnormality on a non con scan  Patient reassessed. No pain  currently. Foley cath placed due to needing to cath q 4 hours at baseline. Cloudy, malodorous urine return.  Reassessed.  Still has some mild tachycardia however improved from baseline.  Defervesced with Tylenol.  Has stable blood pressures with maps greater than 65.  UA with obvious source of infection.  This is likely the cause of her sepsis.  Will admit for further management.  Patient critically ill with questionable sepsis.  IV fluids as well as broad-spectrum antibiotics given.  Low suspicion for septic shock.    CONSULT with Teaching IM resident who agrees to evaluate patient for admission.   The patient appears reasonably stabilized for admission considering the current resources, flow, and capabilities available in the ED at this time, and I doubt any other [facility] requiring further screening and/or treatment in the ED prior to admission.    This patient was evaluated during a time of global shortage of iodinated contrast media. Based on guidance from the [employer] of Radiology, best practices, and local institutional approaches an alternative path for evaluating and managing the patient may have been employed in order to provide optimal care during this shortage. The current situation has been discussed with the patient.   Patient seen by my attending, Dr. Billy Fischer who agrees above treatment, plan and disposition      MDM Rules/Calculators/A&P                          MAURIA ASQUITH was evaluated in [name] on 12/08/2020 for the symptoms described in the history of present illness. She was evaluated in the context of the global COVID-19 pandemic, which necessitated consideration that the patient might be at risk for infection with the SARS-CoV-2 virus that causes COVID-19. Institutional protocols and algorithms that pertain to the evaluation of patients at risk for COVID-19 are in a state of rapid change based on information released by regulatory bodies including  the CDC and federal and state organizations. These policies and algorithms were followed during the patient's care in the ED. Final Clinical Impression(s) / ED Diagnoses Final diagnoses:  Emesis  Sepsis without acute organ dysfunction, due to unspecified organism [facility])  Acute cystitis without hematuria  Paraplegia [facility])    Rx / DC Orders ED Discharge Orders    None       Charise Leinbach A, PA-C 12/08/20 1313    Gareth Morgan, MD 12/11/20 1614

## 2020-12-08 NOTE — ED Triage Notes (Signed)
Pt brought by EMS from nursing facility for vomiting and shivering violently x 1 hr with increase work of breathing.

## 2020-12-08 NOTE — ED Notes (Signed)
Report called  

## 2020-12-09 LAB — COMPREHENSIVE METABOLIC PANEL
ALT: 30 U/L (ref 0–44)
AST: 19 U/L (ref 15–41)
Albumin: 3.1 g/dL — ABNORMAL LOW (ref 3.5–5.0)
Alkaline Phosphatase: 46 U/L (ref 38–126)
Anion gap: 7 (ref 5–15)
BUN: 10 mg/dL (ref 8–23)
CO2: 23 mmol/L (ref 22–32)
Calcium: 9 mg/dL (ref 8.9–10.3)
Chloride: 110 mmol/L (ref 98–111)
Creatinine, Ser: 0.65 mg/dL (ref 0.44–1.00)
GFR, Estimated: >60 mL/min (ref >60)
Glucose, Bld: 88 mg/dL (ref 70–99)
Potassium: 3.2 mmol/L — ABNORMAL LOW (ref 3.5–5.1)
Sodium: 140 mmol/L (ref 135–145)
Total Bilirubin: 0.6 mg/dL (ref 0.3–1.2)
Total Protein: 6.8 g/dL (ref 6.5–8.1)

## 2020-12-09 LAB — CBC
HCT: 37.2 % (ref 36.0–46.0)
Hemoglobin: 11.8 g/dL — ABNORMAL LOW (ref 12.0–15.0)
MCH: 27.1 pg (ref 26.0–34.0)
MCHC: 31.7 g/dL (ref 30.0–36.0)
MCV: 85.5 fL (ref 80.0–100.0)
Platelets: 168 10*3/uL (ref 150–400)
RBC: 4.35 MIL/uL (ref 3.87–5.11)
RDW: 14.7 % (ref 11.5–15.5)
WBC: 8.8 10*3/uL (ref 4.0–10.5)
nRBC: 0 % (ref 0.0–0.2)

## 2020-12-09 LAB — HIV ANTIBODY (ROUTINE TESTING W REFLEX): HIV Screen 4th Generation wRfx: NONREACTIVE

## 2020-12-09 LAB — HEMOGLOBIN A1C
Hgb A1c MFr Bld: 6.2 % — ABNORMAL HIGH (ref 4.8–5.6)
Mean Plasma Glucose: 131.24 mg/dL

## 2020-12-09 LAB — APTT: aPTT: 32 seconds (ref 24–36)

## 2020-12-09 LAB — PROTIME-INR
INR: 1.1 (ref 0.8–1.2)
Prothrombin Time: 14 seconds (ref 11.4–15.2)

## 2020-12-09 LAB — PROCALCITONIN: Procalcitonin: 2.76 ng/mL

## 2020-12-09 LAB — TSH: TSH: 2.968 u[IU]/mL (ref 0.350–4.500)

## 2020-12-09 MED ORDER — TIZANIDINE HCL 2 MG PO TABS
4.0000 mg | ORAL_TABLET | Freq: Three times a day (TID) | ORAL | Status: DC
  Administered 2020-12-09 – 2020-12-11 (×9): 4 mg via ORAL
  Filled 2020-12-09 (×9): qty 2

## 2020-12-09 MED ORDER — POTASSIUM CHLORIDE CRYS ER 20 MEQ PO TBCR
40.0000 meq | EXTENDED_RELEASE_TABLET | Freq: Once | ORAL | Status: AC
  Administered 2020-12-09: 40 meq via ORAL
  Filled 2020-12-09: qty 2

## 2020-12-09 MED ORDER — WHITE PETROLATUM EX OINT
TOPICAL_OINTMENT | CUTANEOUS | Status: DC | PRN
  Administered 2020-12-09: 0.2 via TOPICAL
  Filled 2020-12-09: qty 28.35

## 2020-12-09 MED ORDER — SUCRALFATE 1 GM/10ML PO SUSP
1.0000 g | Freq: Four times a day (QID) | ORAL | Status: DC | PRN
  Administered 2020-12-09 – 2020-12-10 (×2): 1 g via ORAL
  Filled 2020-12-09 (×2): qty 10

## 2020-12-09 MED ORDER — BISACODYL 10 MG RE SUPP
10.0000 mg | Freq: Every day | RECTAL | Status: DC | PRN
  Administered 2020-12-10: 10 mg via RECTAL
  Filled 2020-12-09 (×2): qty 1

## 2020-12-09 NOTE — Progress Notes (Addendum)
Patient ID: [name], female   DOB: [birth year], 70 y.o.   MRN: [id number]   Subjective: HD#1  On: no acute events  Patient reports she is concerned about sweating of lower half of her body. Denies continued sweating on her top half of body. States that she is also having some spasms since being off home medications. Spasms and lower half of body sweating are improved when on home medications. She also report concern about missing urology appointment tomorrow. She endorses a cough. States that she has never had symptoms like what caused her presentation in association with prior UTIs.  She also endorses dryness of her lips and is requesting vaseline.   Objective:  Vital signs in last 24 hours: Vitals:   12/08/20 1619 12/08/20 2021 12/09/20 0500 12/09/20 0623  BP: 109/62 120/66  96/75  Pulse: 70 67  72  Resp: 16 16  16   Temp: 98.4 F (36.9 C) 98.2 F (36.8 C)  (!) 97.5 F (36.4 C)  TempSrc: Oral Oral  Oral  SpO2: 97% 99%  95%  Weight:   78.5 kg    Weight change:   Intake/Output Summary (Last 24 hours) at 12/09/2020 0738 Last data filed at 12/09/2020 0636 Gross per 24 hour  Intake 2598.2 ml  Output 1000 ml  Net 1598.2 ml   Physical Exam  General: obese woman laying comfortably in bed in no acute distress CV: regular rate and rhythm. No murmurs, rubs, or gallops. No lower extremity edema.  Pulm: lungs clear to auscultation bilaterally, normal work of breathing GI: soft, non distended, non tender, normal bowel sounds Skin: warm, dry, intact on upper half. Warm and moist on bilateral lower extremities. No evidence on full body skin exam of any pressure ulcers or other skin wounds.   CBC Latest Ref Rng & Units 12/09/2020 12/08/2020 06/25/2020  WBC 4.0 - 10.5 K/uL 8.8 5.7 6.6  Hemoglobin 12.0 - 15.0 g/dL 11.8(L) 14.4 14.9  Hematocrit 36.0 - 46.0 % 37.2 45.8 47.2(H)  Platelets 150 - 400 K/uL 168 185 201   CMP Latest Ref Rng & Units 12/09/2020 12/08/2020 06/25/2020  Glucose 70  - 99 mg/dL 88 128(H) 102(H)  BUN 8 - 23 mg/dL 10 13 11   Creatinine 0.44 - 1.00 mg/dL 0.65 0.85 0.72  Sodium 135 - 145 mmol/L 140 136 139  Potassium 3.5 - 5.1 mmol/L 3.2(L) 4.3 3.5  Chloride 98 - 111 mmol/L 110 105 100  CO2 22 - 32 mmol/L 23 21(L) 25  Calcium 8.9 - 10.3 mg/dL 9.0 9.7 9.5  Total Protein 6.5 - 8.1 g/dL 6.8 7.8 8.7(H)  Total Bilirubin 0.3 - 1.2 mg/dL 0.6 0.5 0.5  Alkaline Phos 38 - 126 U/L 46 73 53  AST 15 - 41 U/L 19 39 30  ALT 0 - 44 U/L 30 36 39   Specimen Description IN/OUT CATH URINE   Special Requests NONE   Culture Abnormal 3,000 COLONIES/mL GRAM NEGATIVE RODS  SUSCEPTIBILITIES TO FOLLOW  Performed at [facility] [address] [place], [place] [address]    Assessment/Plan:  Active Problems:   Sepsis secondary to UTI [facility])  [name] is a 70 year old female with a history of neurogenic bladder secondary to spinal cord injury (70 years ago), 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   Sepsis secondary to unknown source  Acute symptoms most likely due to complicated cystitis in setting of self catheterization for her neurogenic bladder. Patient no longer febrile. Blood cultures negative, urine culture shows 3,000 colonies of Gram negative rods; not consistent with active UTI. CXR showed no signs of pneumonia, though coarse lung sounds were heard in the ED and she complains of cough. CT chest/abdomen/pelvis unremarkable. Lipase was normal. There were no concerning skin findings on exam and patient denies any sores. On chart review it was noted on 08/30/2020 that patient has autonomic dysfunction that could present similarly.  - continue telemetry monitoring - RFP in the AM - procalcitonin; pending results will decide to d/c antibiotics   Cough Patient reports  bothersome dry cough for past few days. Denies shortness of breath. CXR was unremarkable with no concerns for pneumonia or pulmonary edema. Lung exam unremarkable on admission and today as well. COVID negative. Likely a viral respiratory bronchitis. Unlikely to be source of presentation.  - monitor clinically  - continue continuous pulse ox  Spasms; Diaphoresis  Home medications were held in the setting of sepsis on admission. Patient clinically improving and now reporting spasms that are painful and bothersome. She also reports diaphoresis on lower half of body is bothersome and worse than baseline due to not having her home medications.  - restart home baclofen  - restart home tizaidine  Combined systolic and diastolic heart failure without acute exacerbation No lower extremity edema and no evidence of pulmonary edema on exam today. She takes Lasix 18m BID, Losartan 227OZ and Coreg (uncertain dose) BID at her facility without missing doses.  - Will hold above medications for now given sepsis and soft blood pressure.  - Strict I&O  - Daily weights  - Fluid status will require close monitoring  Dry lips - PRN vaseline  Code Status: Full Code  Diet: Heart Healthy  DVT PPx: Lovenox 479mdaily  IVF: None  Dispo: likely next 1-2 days     LOS: 0 days   WiMikael SprayMedical Student 12/09/2020, 7:38 AM

## 2020-12-09 NOTE — Hospital Course (Addendum)
Sepsis secondary to complicated cystitis  [name] presented to [facility] ED due to acute onset nausea, vomiting and diaphoresis and was found to have UA with large amount of nitrites and leukocytes with few bacteria concerning for UTI. On presentation she met SIRS criteria and had elevated lactic acid. She was started on cefepime and received two one liter boluses, then 12 hours of LR at 75 cc/hr. Urine culture showed 3,000 colonies of Gram negative rods.      This morning, [name] notes feeling "about the same". Concerned about her sore on her lip. She says that it is a little sore that has been there for "months". Discussed following up with dermatologist for this.  Discussed that she likely does not have an infectious process causing her symptoms at the moment and that it is likely progression of her spinal cord injury.  Also notes that it is difficult to arouse her from sleep. No trouble going to sleep or staying asleep.

## 2020-12-10 DIAGNOSIS — G909 Disorder of the autonomic nervous system, unspecified: Secondary | ICD-10-CM | POA: Diagnosis not present

## 2020-12-10 DIAGNOSIS — B182 Chronic viral hepatitis C: Secondary | ICD-10-CM | POA: Diagnosis present

## 2020-12-10 DIAGNOSIS — N319 Neuromuscular dysfunction of bladder, unspecified: Secondary | ICD-10-CM | POA: Diagnosis present

## 2020-12-10 DIAGNOSIS — I5041 Acute combined systolic (congestive) and diastolic (congestive) heart failure: Secondary | ICD-10-CM

## 2020-12-10 DIAGNOSIS — N189 Chronic kidney disease, unspecified: Secondary | ICD-10-CM | POA: Diagnosis present

## 2020-12-10 DIAGNOSIS — C220 Liver cell carcinoma: Secondary | ICD-10-CM | POA: Diagnosis present

## 2020-12-10 DIAGNOSIS — K7460 Unspecified cirrhosis of liver: Secondary | ICD-10-CM | POA: Diagnosis present

## 2020-12-10 DIAGNOSIS — Z9013 Acquired absence of bilateral breasts and nipples: Secondary | ICD-10-CM | POA: Diagnosis not present

## 2020-12-10 DIAGNOSIS — R509 Fever, unspecified: Secondary | ICD-10-CM | POA: Diagnosis not present

## 2020-12-10 DIAGNOSIS — A419 Sepsis, unspecified organism: Secondary | ICD-10-CM | POA: Diagnosis not present

## 2020-12-10 DIAGNOSIS — K219 Gastro-esophageal reflux disease without esophagitis: Secondary | ICD-10-CM | POA: Diagnosis present

## 2020-12-10 DIAGNOSIS — K449 Diaphragmatic hernia without obstruction or gangrene: Secondary | ICD-10-CM | POA: Diagnosis present

## 2020-12-10 DIAGNOSIS — G4700 Insomnia, unspecified: Secondary | ICD-10-CM | POA: Diagnosis present

## 2020-12-10 DIAGNOSIS — G8220 Paraplegia, unspecified: Secondary | ICD-10-CM | POA: Diagnosis present

## 2020-12-10 DIAGNOSIS — N3000 Acute cystitis without hematuria: Secondary | ICD-10-CM | POA: Diagnosis present

## 2020-12-10 DIAGNOSIS — K3184 Gastroparesis: Secondary | ICD-10-CM | POA: Diagnosis present

## 2020-12-10 DIAGNOSIS — Z79899 Other long term (current) drug therapy: Secondary | ICD-10-CM | POA: Diagnosis not present

## 2020-12-10 DIAGNOSIS — M069 Rheumatoid arthritis, unspecified: Secondary | ICD-10-CM | POA: Diagnosis present

## 2020-12-10 DIAGNOSIS — D509 Iron deficiency anemia, unspecified: Secondary | ICD-10-CM | POA: Diagnosis present

## 2020-12-10 DIAGNOSIS — I428 Other cardiomyopathies: Secondary | ICD-10-CM | POA: Diagnosis present

## 2020-12-10 DIAGNOSIS — Z87440 Personal history of urinary (tract) infections: Secondary | ICD-10-CM | POA: Diagnosis not present

## 2020-12-10 DIAGNOSIS — R110 Nausea: Secondary | ICD-10-CM | POA: Diagnosis present

## 2020-12-10 DIAGNOSIS — R61 Generalized hyperhidrosis: Secondary | ICD-10-CM | POA: Diagnosis present

## 2020-12-10 DIAGNOSIS — Z20822 Contact with and (suspected) exposure to covid-19: Secondary | ICD-10-CM | POA: Diagnosis present

## 2020-12-10 DIAGNOSIS — F32A Depression, unspecified: Secondary | ICD-10-CM | POA: Diagnosis present

## 2020-12-10 DIAGNOSIS — I130 Hypertensive heart and chronic kidney disease with heart failure and stage 1 through stage 4 chronic kidney disease, or unspecified chronic kidney disease: Secondary | ICD-10-CM | POA: Diagnosis present

## 2020-12-10 DIAGNOSIS — Z853 Personal history of malignant neoplasm of breast: Secondary | ICD-10-CM | POA: Diagnosis not present

## 2020-12-10 DIAGNOSIS — N390 Urinary tract infection, site not specified: Secondary | ICD-10-CM | POA: Diagnosis not present

## 2020-12-10 DIAGNOSIS — G901 Familial dysautonomia [Riley-Day]: Secondary | ICD-10-CM | POA: Diagnosis present

## 2020-12-10 DIAGNOSIS — I5042 Chronic combined systolic (congestive) and diastolic (congestive) heart failure: Secondary | ICD-10-CM | POA: Diagnosis present

## 2020-12-10 LAB — STREP PNEUMONIAE URINARY ANTIGEN: Strep Pneumo Urinary Antigen: NEGATIVE

## 2020-12-10 LAB — RENAL FUNCTION PANEL
Albumin: 3 g/dL — ABNORMAL LOW (ref 3.5–5.0)
Anion gap: 7 (ref 5–15)
BUN: 9 mg/dL (ref 8–23)
CO2: 23 mmol/L (ref 22–32)
Calcium: 9.2 mg/dL (ref 8.9–10.3)
Chloride: 107 mmol/L (ref 98–111)
Creatinine, Ser: 0.62 mg/dL (ref 0.44–1.00)
GFR, Estimated: >60 mL/min (ref >60)
Glucose, Bld: 92 mg/dL (ref 70–99)
Phosphorus: 3.2 mg/dL (ref 2.5–4.6)
Potassium: 3.3 mmol/L — ABNORMAL LOW (ref 3.5–5.1)
Sodium: 137 mmol/L (ref 135–145)

## 2020-12-10 LAB — RESPIRATORY PANEL BY PCR

## 2020-12-10 LAB — URINE CULTURE: Culture: 3000 — AB

## 2020-12-10 LAB — CBC
HCT: 37.1 % (ref 36.0–46.0)
Hemoglobin: 11.9 g/dL — ABNORMAL LOW (ref 12.0–15.0)
MCH: 27.3 pg (ref 26.0–34.0)
MCHC: 32.1 g/dL (ref 30.0–36.0)
MCV: 85.1 fL (ref 80.0–100.0)
Platelets: 171 10*3/uL (ref 150–400)
RBC: 4.36 MIL/uL (ref 3.87–5.11)
RDW: 14.6 % (ref 11.5–15.5)
WBC: 5.9 10*3/uL (ref 4.0–10.5)
nRBC: 0 % (ref 0.0–0.2)

## 2020-12-10 MED ORDER — POTASSIUM CHLORIDE CRYS ER 20 MEQ PO TBCR
40.0000 meq | EXTENDED_RELEASE_TABLET | Freq: Once | ORAL | Status: AC
  Administered 2020-12-10: 40 meq via ORAL
  Filled 2020-12-10: qty 2

## 2020-12-10 MED ORDER — ALBUMIN HUMAN 25 % IV SOLN
50.0000 g | Freq: Once | INTRAVENOUS | Status: AC
  Administered 2020-12-10: 12.5 g via INTRAVENOUS
  Filled 2020-12-10: qty 200

## 2020-12-10 NOTE — Progress Notes (Signed)
Patient ID: [name], female   DOB: 16-Dec-1950, 70 y.o.   MRN: [id number]   Subjective: HD#2  ON: No acute events  Patient continues to express discomfort due to sweating of lower extremities. She states her cough is dry and not really bothersome. She does not think her cough is related to her present illness. She denies shortness of breath.   Objective:  Vital signs in last 24 hours: Vitals:   12/09/20 1755 12/09/20 2002 12/10/20 0515 12/10/20 0553  BP: (!) 105/50 (!) 103/47 (!) 102/38 127/68  Pulse: 71 62 74 67  Resp: 18 17 16 18   Temp:  98.2 F (36.8 C) 97.7 F (36.5 C)   TempSrc:  Oral Oral   SpO2: 95% 96% 100% 97%  Weight:       Weight change:   Intake/Output Summary (Last 24 hours) at 12/10/2020 0645 Last data filed at 12/09/2020 2300 Gross per 24 hour  Intake 900 ml  Output 800 ml  Net 100 ml   Physical exam:  General: woman laying comfortably in bed in no acute distress CV: normal rate and rhythm, distant heart sounds due to body habitus and positioning  Pulm: lungs clear to auscultation bilaterally, normal work of breathing GI: soft, non tender, non distended Neuro: bilateral hemiplegia of lower extremities; alert and oriented x4  CBC Latest Ref Rng & Units 12/10/2020 12/09/2020 12/08/2020  WBC 4.0 - 10.5 K/uL 5.9 8.8 5.7  Hemoglobin 12.0 - 15.0 g/dL 11.9(L) 11.8(L) 14.4  Hematocrit 36.0 - 46.0 % 37.1 37.2 45.8  Platelets 150 - 400 K/uL 171 168 185   CMP Latest Ref Rng & Units 12/10/2020 12/09/2020 12/08/2020  Glucose 70 - 99 mg/dL 92 88 128(H)  BUN 8 - 23 mg/dL 9 10 13   Creatinine 0.44 - 1.00 mg/dL 0.62 0.65 0.85  Sodium 135 - 145 mmol/L 137 140 136  Potassium 3.5 - 5.1 mmol/L 3.3(L) 3.2(L) 4.3  Chloride 98 - 111 mmol/L 107 110 105  CO2 22 - 32 mmol/L 23 23 21(L)  Calcium 8.9 - 10.3 mg/dL 9.2 9.0 9.7  Total Protein 6.5 - 8.1 g/dL - 6.8 7.8  Total Bilirubin 0.3 - 1.2 mg/dL - 0.6 0.5  Alkaline Phos 38 - 126 U/L - 46 73  AST 15 - 41 U/L - 19 39  ALT 0  - 44 U/L - 30 36    Assessment/Plan:  Active Problems:   Sepsis secondary to UTI (HCC)  [name] is a 70 year old female with a history of 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   Sepsis secondary to unknown source - atypical pneumonia v autonomic dysregulation Blood cultures negative, urine culture shows 3,000 colonies of Gram negative rods; not consistent with active UTI.  CT chest/abdomen/pelvis unremarkable. Lipase was normal. There were no concerning skin findings on exam and patient denies any sores. CXR and CT showed no signs of pneumonia, though coarse lung sounds were heard in the ED and she complains of cough. Procal 2.76 therefore kept on antibiotics, however patient had already received antibiotics which could have falsely elevated this result. Will work up for atypical pneumonia given cough and the fact that patient comes from SNF. Patient tested negative for COVID and influenza.  Other considerations at this point include dysautonomia. 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 Notes also reveal difficulty with her SNF being able to catheterize her as frequently as needed and she cannot self catheterize. She was initially febrile on admission, but no fevers since. All other vital signs have also remained stable since admission. Foley was placed in ED which could have relieved pressure from urinary retention, thus resolving issues.  Spoke with neurology today who agree that given normal mental status and stable vital signs that if all other infectious work up is negative presentation is likely due to dysautonomia and patient can be discharged with  outpatient follow up.  - continue telemetry monitoring - CMP in the AM - Continue cefepime as procal is mildly elevated; D/C pending results of further infectious work up - neurology consult  - legionella urine antigen - chlamydia serum panel - mycoplamsa serum IgM   - respiratory viral panel   Spasms; Diaphoresis  Home medications were held in the setting of sepsis on admission. Patient clinically improving and now reporting spasms that are painful and bothersome. She also reports diaphoresis on lower half of body is bothersome and worse than baseline. - restart home baclofen  - restart home tizaidine  Combined systolic and diastolic heart failure without acute exacerbation No lower extremity edema and no evidence of pulmonary edema on exam today. She takes Lasix 76m BID, Losartan 284ZY and Coreg (uncertain dose) BID at her facility without missing doses.  - Will hold above medications for now given sepsis and soft blood pressure.  - Strict I&O  - Daily weights  - Fluid status will require close monitoring   Code Status: Full Code  Diet: Heart Healthy  DVT PPx: Lovenox 467mdaily  IVF: None  Dispo: likely next 1-2 days       LOS: 0 days   [name] SprayMedical Student 12/10/2020, 6:45 AM

## 2020-12-10 NOTE — TOC Initial Note (Signed)
Transition of Care [facility]) - Initial/Assessment Note    Patient Details  Name: [name] MRN: [id number] Date of Birth: [birth year]  Transition of Care [facility]) CM/SW Contact:    [name] Feil, LCSW Phone Number: 12/10/2020, 5:12 PM  Clinical Narrative: Talked with patient at bedside regarding her discharge disposition. [name] was in bed and was awake, alert, oriented and willing to talk with CSW. Patient confirmed that she has been at [facility] since 2009 and plans to return there at discharge. When asked, patient requested that her sister [name] be contacted when she is ready for discharge.                  Expected Discharge Plan: [place] ([place]) Barriers to Discharge: Continued Medical Work up   Patient Goals and CMS Choice Patient states their goals for this hospitalization and ongoing recovery are:: Patient indicated that she will be returning to [place] when discharged from [place] Medicare.gov Compare Post Acute Care list provided to:: Other (Comment Required) (not needed as patient plans to return to [facility]) Choice offered to / list presented to : NA  Expected Discharge Plan and Services Expected Discharge Plan: [place] ([facility] [place]) In-house Referral: Clinical Social Work     Living arrangements for the past 2 months: [place] ([place])                                      Prior Living Arrangements/Services Living arrangements for the past 2 months: [place] ([place]) Lives with:: Facility Resident Patient language and need for interpreter reviewed:: No Do you feel safe going back to the place where you live?: Yes      Need for Family Participation in Patient Care: No (Comment) Care giver support system in place?: Yes (comment)   Criminal Activity/Legal Involvement Pertinent to Current  Situation/Hospitalization: No - Comment as needed  Activities of Daily Living      Permission Sought/Granted Permission sought to share information with : Family Supports    Share Information with NAME: [name]     Permission granted to share info w Relationship: Sister  Permission granted to share info w Contact Information: [phone]  Emotional Assessment Appearance:: Appears stated age Attitude/Demeanor/Rapport: Engaged Affect (typically observed): Appropriate Orientation: : Oriented to Self,Oriented to Place,Oriented to  Time,Oriented to Situation Alcohol / Substance Use: Tobacco Use,Alcohol Use,Illicit Drugs (Per H&P, patient quit smoking and does not consume alcohol or use illicit drugs) Psych Involvement: No (comment)  Admission diagnosis:  Paraplegia ([facility]) [G82.20] Emesis [R11.10] Acute cystitis without hematuria [N30.00] Sepsis secondary to UTI ([address]) [A41.9, N39.0] Sepsis without acute organ dysfunction, due to unspecified organism [facility]) [A41.9] Patient Active Problem List   Diagnosis Date Noted  . Sepsis secondary to UTI ([place]) 12/08/2020  . Spasticity 12/06/2020  . Contracture of lower leg joint 12/06/2020  . Autonomic dysfunction 08/30/2020  . Neurogenic bowel 08/30/2020  . Wheelchair dependence 08/30/2020  . Paraplegia following spinal cord injury ([place]) 07/07/2018  . Medication management 06/07/2018  . Cough 06/07/2018  . Cirrhosis ([place]) 04/20/2018  . Obstipation/fecal impaction 02/12/2018  . Intractable vomiting 02/11/2018  . Chronic hepatitis C without hepatic coma ([place]) 01/10/2018  . NICM (nonischemic cardiomyopathy) ([place]) 08/25/2017  . Normal coronary arteries 08/25/2017  . Diastolic dysfunction 09/62/8366  . S/P thoracentesis   . Status post thoracentesis   .  S/p percutaneous right heart catheterization   . Pleural effusion on right 06/29/2017  . Respiratory failure with hypoxia ([place]) 06/29/2017  . Pressure injury of skin 06/29/2017  .  Hypokalemia 09/21/2016  . Fever in adult 07/28/2016  . Decubitus ulcer of coccyx 02/04/2016  . Hyperglycemia 12/03/2015  . H/O bilateral mastectomy 07/25/2015  . Benign hypertensive heart disease without heart failure 07/15/2015  . Hiatal hernia 07/06/2015  . 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 Pleural effusion 07/06/2015  . History of breast cancer 08/21/2014  . Adynamic ileus ([place]) 07/22/2014  . Elevated liver enzymes 07/22/2014  . CHF (congestive heart failure) ([place]) 07/05/2014  . Gastroparesis 07/05/2014  . Microcytic anemia 01/05/2014  . Insomnia 07/14/2013  . Edema 05/01/2013  . Rheumatoid arthritis ([place]) 02/23/2013  . GERD (gastroesophageal reflux disease) 02/23/2013  . Essential hypertension 02/23/2013  . Neurogenic bladder 02/23/2013  . Paraplegia ([place]) 02/23/2013  . Depression 02/23/2013  . Allergic rhinitis 02/23/2013  . Constipation 02/23/2013  . 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 Breast cancer, left breast ([place]) 09/08/2011   PCP:  [name] Renshaw, MD Pharmacy:  No Pharmacies Listed    Social Determinants of Health (SDOH) Interventions  No SDOH interventions requested or needed at this time.  Readmission Risk Interventions No flowsheet data found.

## 2020-12-10 NOTE — Plan of Care (Signed)
  Problem: Clinical Measurements: Goal: Will remain free from infection Outcome: Progressing   Problem: Activity: Goal: Risk for activity intolerance will decrease Outcome: Progressing   

## 2020-12-10 NOTE — Progress Notes (Signed)
This patient's plan of care was discussed with the house staff. Please see their note for complete details. I concur with their findings.  Etiology of pt's fever on adm is not clear- her UCx was insig growth. Her BCx have been ngtd. Not clear a repeat UCx will be helpful (sent on anbx).  Her LE diaphoresis continues to upset her. I believe this is related to her acute inflammation.  Her CXR is negative and she states he cough is baseline.  Would consider stopping her anbx.

## 2020-12-11 DIAGNOSIS — G909 Disorder of the autonomic nervous system, unspecified: Secondary | ICD-10-CM

## 2020-12-11 DIAGNOSIS — G8220 Paraplegia, unspecified: Secondary | ICD-10-CM

## 2020-12-11 LAB — MYCOPLASMA PNEUMONIAE ANTIBODY, IGM: Mycoplasma pneumo IgM: <770 U/mL (ref 0–769)

## 2020-12-11 LAB — CBC
HCT: 38.5 % (ref 36.0–46.0)
Hemoglobin: 12.1 g/dL (ref 12.0–15.0)
MCH: 27.1 pg (ref 26.0–34.0)
MCHC: 31.4 g/dL (ref 30.0–36.0)
MCV: 86.1 fL (ref 80.0–100.0)
Platelets: 176 10*3/uL (ref 150–400)
RBC: 4.47 MIL/uL (ref 3.87–5.11)
RDW: 14.6 % (ref 11.5–15.5)
WBC: 6.2 10*3/uL (ref 4.0–10.5)
nRBC: 0 % (ref 0.0–0.2)

## 2020-12-11 LAB — RENAL FUNCTION PANEL
Albumin: 3.8 g/dL (ref 3.5–5.0)
Anion gap: 5 (ref 5–15)
BUN: 8 mg/dL (ref 8–23)
CO2: 24 mmol/L (ref 22–32)
Calcium: 9.9 mg/dL (ref 8.9–10.3)
Chloride: 109 mmol/L (ref 98–111)
Creatinine, Ser: 0.59 mg/dL (ref 0.44–1.00)
GFR, Estimated: >60 mL/min (ref >60)
Glucose, Bld: 83 mg/dL (ref 70–99)
Phosphorus: 3 mg/dL (ref 2.5–4.6)
Potassium: 4.6 mmol/L (ref 3.5–5.1)
Sodium: 138 mmol/L (ref 135–145)

## 2020-12-11 LAB — SARS CORONAVIRUS 2 (TAT 6-24 HRS): SARS Coronavirus 2: NEGATIVE

## 2020-12-11 LAB — LEGIONELLA PNEUMOPHILA SEROGP 1 UR AG: L. pneumophila Serogp 1 Ur Ag: NEGATIVE

## 2020-12-11 NOTE — Progress Notes (Signed)
[name] to be discharged [facility] per MD order. Patient verbalized understanding.  Skin clean, dry and intact without evidence of skin break down, no evidence of skin tears noted. IV catheter discontinued intact. Site without signs and symptoms of complications. Dressing and pressure applied. Pt denies pain at the site currently. No complaints noted.  Patient going to facility with foley catheter per MD's order.   Discharge packet assembled. An After Visit Summary (AVS) was printed and given to the EMS personnel. Patient escorted via stretcher and discharged to [facility] via ambulance. Report called to accepting facility; all questions and concerns addressed.   Suzy Bouchard, RN

## 2020-12-11 NOTE — Progress Notes (Signed)
Attempted to call report to [facility] at this time but no response.

## 2020-12-11 NOTE — Discharge Summary (Signed)
Name: GEANA WALTS MRN: [id number] DOB: Feb 08, 1951 70 y.o. PCP: Caprice Renshaw, MD  Date of Admission: 12/08/2020  6:12 AM Date of Discharge: 12/11/2020 Attending Physician: Campbell Riches, MD  Discharge Diagnosis: 1. Autonomic dysfunction  2. Lip ulcer   Discharge Medications: Allergies as of 12/11/2020   No Known Allergies     Medication List    TAKE these medications   acetaminophen 325 MG tablet Commonly known as: TYLENOL Take 650 mg by mouth every 6 (six) hours as needed for mild pain or fever.   albuterol 108 (90 Base) MCG/ACT inhaler Commonly known as: VENTOLIN HFA Inhale 2 puffs into the lungs every 4 (four) hours as needed for wheezing or shortness of breath.   alum & mag hydroxide-simeth 326-712-45 MG/5ML suspension Commonly known as: MAALOX PLUS Take 10 mLs by mouth every 6 (six) hours as needed for indigestion (nausea, Gas).   baclofen 10 MG tablet Commonly known as: LIORESAL Take 10-15 mg by mouth See admin instructions. 10 mg  bid at 6 am and 12 pm 15 mg bid at 12 am and 6 pm   Benzocaine 10 MG Lozg Use as directed 1 lozenge in the mouth or throat every 2 (two) hours as needed.   bisacodyl 10 MG suppository Commonly known as: DULCOLAX Place 1 suppository (10 mg total) rectally every Tuesday, Thursday, Saturday, and Sunday at 6 PM.   calcium-vitamin D 500-200 MG-UNIT tablet Commonly known as: OSCAL WITH D Take 1 tablet by mouth 2 (two) times daily.   Carboxymethylcellulose Sod PF 0.5 % Soln Place 1 drop into both eyes in the morning, at noon, in the evening, and at bedtime.   Carboxymethylcellulose Sod PF 0.5 % Soln Place 1 drop into both eyes every 6 (six) hours as needed (comfort).   carvedilol 12.5 MG tablet Commonly known as: COREG Take 1 tablet (12.5 mg total) by mouth 2 (two) times daily with a meal.   carvedilol 6.25 MG tablet Commonly known as: COREG Take 1 tablet (6.25 mg total) by mouth 2 (two) times daily. Take with 12.5 to  make 18.75 twice daily.   cetirizine 10 MG tablet Commonly known as: ZYRTEC Take 10 mg by mouth daily as needed for allergies.   cholecalciferol 1000 units tablet Commonly known as: VITAMIN D Take 1,000 Units by mouth daily.   Cranberry 450 MG Tabs Take 450 mg by mouth daily.   diclofenac Sodium 1 % Gel Commonly known as: VOLTAREN Apply 2 g topically in the morning, at noon, and at bedtime.   docusate sodium 100 MG capsule Commonly known as: COLACE Take 100 mg by mouth in the morning, at noon, and at bedtime.   furosemide 40 MG tablet Commonly known as: LASIX Take 40 mg by mouth 2 (two) times daily.   hydrocortisone 2.5 % cream Apply 1 application topically 2 (two) times daily as needed (itching). To arms and legs   hydroxychloroquine 200 MG tablet Commonly known as: PLAQUENIL Take 200 mg by mouth daily.   linaclotide 290 MCG Caps capsule Commonly known as: LINZESS Take 290 mcg by mouth daily before breakfast.   losartan 25 MG tablet Commonly known as: COZAAR Take 25 mg by mouth daily.   Lumify 0.025 % Soln Generic drug: Brimonidine Tartrate Place 1 drop into both eyes daily as needed (redness).   Melatonin 3 MG Tbdp Take 3 mg by mouth at bedtime as needed (sleeo).   multivitamin with minerals Tabs tablet Take 1 tablet by mouth daily.  Myrbetriq 50 MG Tb24 tablet Generic drug: mirabegron ER Take 50 mg by mouth daily.   omeprazole 20 MG capsule Commonly known as: PRILOSEC Take 20 mg by mouth 2 (two) times daily.   oxybutynin 5 MG 24 hr tablet Commonly known as: DITROPAN-XL Take 5 mg by mouth daily.   polyethylene glycol 17 g packet Commonly known as: MIRALAX / GLYCOLAX Take 17 g by mouth every Monday, Wednesday, Friday, and Saturday at 6 PM. What changed:   when to take this  additional instructions   prenatal multivitamin Tabs tablet Take 1 tablet by mouth daily at 12 noon.   sennosides-docusate sodium 8.6-50 MG tablet Commonly known as:  SENOKOT-S Take 2 tablets by mouth 2 (two) times daily. What changed: when to take this   sertraline 50 MG tablet Commonly known as: ZOLOFT Take 50 mg by mouth daily.   spironolactone 100 MG tablet Commonly known as: ALDACTONE Take 1 tablet (100 mg total) by mouth daily. For edema   sucralfate 1 GM/10ML suspension Commonly known as: CARAFATE Take 1 g by mouth every 6 (six) hours as needed (for [place]).   tiZANidine 4 MG tablet Commonly known as: ZANAFLEX Take 4 mg by mouth 3 (three) times daily. For Muscle spasms   traMADol 50 MG tablet Commonly known as: ULTRAM Take 1 tablet (50 mg total) by mouth every 6 (six) hours as needed. What changed: reasons to take this       Disposition and follow-up:   Ms.Yaniris B Schubring was discharged from [facility] in Stable condition.  At the hospital follow up visit please address:  1.  Autonomic dysfunction - Will need catheterization a minimum of 4 times per day, discuss other      options for long term management of neurogenic bladder with urologist, PM&R and outpatient neurologist.        Lip ulcer - may need dermatology referral   2.  Labs / imaging needed at time of follow-up: none  3.  Pending labs/ test needing follow-up:   leigonella urine antigen   Chlamydia panel   Mycoplasma pneumoniae antibody   Follow-up Appointments:  Follow-up Information    Caprice Renshaw, MD Follow up.   Specialty: Internal Medicine Contact information: [place] [id number] [phone]        Minus Breeding, MD .   Specialty: Cardiology Contact information: [place] [address] [phone]        Courtney Heys, MD Follow up.   Specialty: Physical Medicine and Rehabilitation Contact information: [address] [place] [address] [phone]        Robley Fries, MD Follow up.   Specialty: Urology Contact information: [place] [address] [place] Hospital Course by problem list: 1. Autonomic dysfunction SHATASIA CUTSHAW presented to [facility] ED due to acute onset nausea, vomiting and diaphoresis and was found to have UA with large amount of nitrites and leukocytes with few bacteria concerning for UTI. On presentation she met SIRS criteria and had elevated lactic acid. She was started on cefepime and received two one liter boluses, then 12 hours of LR at 75 cc/hr. Urine culture showed 3,000 colonies of Gram negative rods.  CXR and CT chest/abdomen/pelvis were unremarkable. She was worked up for atypical pneumonia since she is higher risk living in a SNF. Respiratory panel  was negative and leigonella, mycoplasma, and chlamydia testing was performed and pending at time of discharge.  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 Notes also reveal difficulty with her SNF being able to catheterize her as frequently as needed and she cannot self catheterize. She was initially febrile on admission, but no fevers since placement of foley. All other vital signs have also remained stable since admission. Foley was placed in ED which likely relieved pressure from urinary retention, thus resolving issues. Considering the the information above and the lack of evidence of for any source of infection antibiotics were discontinued. Vital signs and labs remained stable and within normal limits after this. Neurology was consulted and they agreed that given normal mental status and stable vital signs with negative infectious work up, presentation is likely due to dysautonomia in setting of progressively worsening spinal cord injury and patient can be discharged with outpatient follow up.   2. Lip ulcer Patient has small greyish ulcer on lower L lip that is tender to touch. She states that it has been there for about a month and  not changed. Recommend outpatient follow up with primary care doctor v dermatology.   Discharge Exam:   BP (!) 125/55 (BP Location: Right Arm)   Pulse 64   Temp 98.7 F (37.1 C) (Oral)   Resp 18   Wt 78.5 kg   SpO2 94%   BMI 28.80 kg/m    Discharge exam:  General: woman laying comfortably in bed in no acute distress CV: normal rate and rhythm, distant heart sounds due to body habitus and positioning.  Pulm: lungs clear to auscultation bilaterally, normal work of breathing GI: soft, non tender, non distended Neuro: bilateral hemiplegia of lower extremities; alert and oriented x4 Skin: grey ulceration on lower L lip, tender to palpation  Pertinent Labs, Studies, and Procedures:  CBC Latest Ref Rng & Units 12/11/2020 12/10/2020 12/09/2020  WBC 4.0 - 10.5 K/uL 6.2 5.9 8.8  Hemoglobin 12.0 - 15.0 g/dL 12.1 11.9(L) 11.8(L)  Hematocrit 36.0 - 46.0 % 38.5 37.1 37.2  Platelets 150 - 400 K/uL 176 171 168   CMP Latest Ref Rng & Units 12/11/2020 12/10/2020 12/09/2020  Glucose 70 - 99 mg/dL 83 92 88  BUN 8 - 23 mg/dL 8 9 10   Creatinine 0.44 - 1.00 mg/dL 0.59 0.62 0.65  Sodium 135 - 145 mmol/L 138 137 140  Potassium 3.5 - 5.1 mmol/L 4.6 3.3(L) 3.2(L)  Chloride 98 - 111 mmol/L 109 107 110  CO2 22 - 32 mmol/L 24 23 23   Calcium 8.9 - 10.3 mg/dL 9.9 9.2 9.0  Total Protein 6.5 - 8.1 g/dL - - 6.8  Total Bilirubin 0.3 - 1.2 mg/dL - - 0.6  Alkaline Phos 38 - 126 U/L - - 46  AST 15 - 41 U/L - - 19  ALT 0 - 44 U/L - - 30   Urinalysis    Component Value Date/Time   COLORURINE YELLOW 12/08/2020 1015   APPEARANCEUR HAZY (A) 12/08/2020 1015   LABSPEC 1.018 12/08/2020 1015   PHURINE 5.0 12/08/2020 1015   GLUCOSEU NEGATIVE 12/08/2020 1015   HGBUR SMALL (A) 12/08/2020 1015   BILIRUBINUR NEGATIVE 12/08/2020 [place] 12/08/2020 1015   PROTEINUR 30 (A) 12/08/2020 1015   UROBILINOGEN 1.0 09/30/2010 1854   NITRITE POSITIVE (A) 12/08/2020 1015   LEUKOCYTESUR LARGE (A) 12/08/2020 1015    Strep Pneumo Urinary  Antigen NEGATIVE NEGATIVE     Ref Range & Units 1 d ago  Adenovirus NOT DETECTED NOT DETECTED   Coronavirus 229E NOT DETECTED NOT DETECTED   Comment: (NOTE)  The Coronavirus on the Respiratory Panel, DOES NOT test for the novel  Coronavirus (2019 nCoV)   Coronavirus HKU1 NOT DETECTED NOT DETECTED   Coronavirus NL63 NOT DETECTED NOT DETECTED   Coronavirus OC43 NOT DETECTED NOT DETECTED   Metapneumovirus NOT DETECTED NOT DETECTED   Rhinovirus / Enterovirus NOT DETECTED NOT DETECTED   Influenza A NOT DETECTED NOT DETECTED   Influenza B NOT DETECTED NOT DETECTED   Parainfluenza Virus 1 NOT DETECTED NOT DETECTED   Parainfluenza Virus 2 NOT DETECTED NOT DETECTED   Parainfluenza Virus 3 NOT DETECTED NOT DETECTED   Parainfluenza Virus 4 NOT DETECTED NOT DETECTED   Respiratory Syncytial Virus NOT DETECTED NOT DETECTED   Bordetella pertussis NOT DETECTED NOT DETECTED   Bordetella Parapertussis NOT DETECTED NOT DETECTED   Chlamydophila pneumoniae NOT DETECTED NOT DETECTED   Mycoplasma pneumoniae NOT DETECTED NOT DETECTED    Component 3 d ago  Specimen Description BLOOD SITE NOT SPECIFIED   Special Requests BOTTLES DRAWN AEROBIC AND ANAEROBIC Blood Culture adequate volume   Culture NO GROWTH 3 DAYS  Performed at [facility], [place] [address] [place], [place] [address]   Report Status PENDING    Specimen Description IN/OUT CATH URINE   Special Requests NONE  Performed at [facility], [place] [address], [place], [place] [address]   Culture 3,000 COLONIES/mL ESCHERICHIA COLIAbnormal   Report Status 12/10/2020 FINAL   Organism ID, Bacteria ESCHERICHIA COLIAbnormal     Discharge Instructions: Discharge Instructions    (HEART FAILURE PATIENTS) Call MD:  Anytime you have any of the following symptoms: 1) 3 pound weight gain in 24 hours or 5 pounds in 1 week 2) shortness of breath, with or without a dry hacking cough 3) swelling in the hands, feet or  stomach 4) if you have to sleep on extra pillows at night in order to breathe.   Complete by: As directed    Call MD for:  difficulty breathing, headache or visual disturbances   Complete by: As directed    Call MD for:  extreme fatigue   Complete by: As directed    Call MD for:  persistant dizziness or light-headedness   Complete by: As directed    Call MD for:  persistant nausea and vomiting   Complete by: As directed    Call MD for:  redness, tenderness, or signs of infection (pain, swelling, redness, odor or green/yellow discharge around incision site)   Complete by: As directed    Call MD for:  severe uncontrolled pain   Complete by: As directed    Call MD for:  temperature >100.4   Complete by: As directed    Diet - low sodium heart healthy   Complete by: As directed    Discharge instructions   Complete by: As directed    Ms Tinisha Etzkorn,  You were admitted to the hospital with fevers and chills with concerns for infection. You were started on antibiotics initially. However, further work up revealed no significant source of infection. Antibiotics were discontinued with no further fevers noted during admission. As discussed, it is likely that your symptoms were secondary to progression of your spinal cord injury. We recommend following up with your PM&R and neurologist as outpatient.   Thank you!   Increase activity slowly  Complete by: As directed    No wound care   Complete by: As directed    No wound care   Complete by: As directed       Signed: Harvie Heck, MD  IMTS PGY-2 12/11/2020, 11:51 AM   Pager: [phone]

## 2020-12-11 NOTE — TOC Transition Note (Signed)
Transition of Care [facility]) - CM/SW Discharge Note *[place] for [place]: [phone]   Patient Details  Name: [name] MRN: [id number] Date of Birth: 1950/11/24  Transition of Care [facility]) CM/SW Contact:  Sable Feil, LCSW Phone Number: 12/11/2020, 4:22 PM   Clinical Narrative:  Patient medically stable for discharge and returning to [facility], where she is a long-term care resident. Facility advised that patient ready for discharge and discharge summary transmitted to admissions director. Patient's sister-Barbara Ewings [phone]) contacted and informed regarding discharge. Ms. Ludwig will be transported by non-emergency ambulance transport.    Final next level of care: [place] ([place]) Barriers to Discharge: Barriers Resolved   Patient Goals and CMS Choice Patient states their goals for this hospitalization and ongoing recovery are:: Patient returning to [facility] once ready for d/c CMS Medicare.gov Compare Post Acute Care list provided to:: Other (Comment Required) (Not needed as patient returning to SNF long-term care) Choice offered to / list presented to : NA  Discharge Placement   Existing PASRR number confirmed : 12/10/20          Patient chooses bed at: Other - please specify in the comment section below: ([facility]) Patient to be transferred to facility by: Non-emergency ambulance transport Name of family member notified: Sister [name] - [phone] Patient and family notified of of transfer: 12/11/20  Discharge Plan and Services In-house Referral: Clinical Social Work                                  Social Determinants of Health (SDOH) Interventions  No SDOH interventions requested or needed at discharge.   Readmission Risk Interventions No flowsheet data found.

## 2020-12-11 NOTE — NC FL2 (Signed)
[place] LEVEL OF CARE SCREENING TOOL     IDENTIFICATION  Patient Name: [name] Birthdate: 1950/09/10 Sex: female Admission Date (Current Location): 12/08/2020  [place] and [place] Number:  Kathleen Argue [phone] [place] and Address:  [facility], [place] [address] [place], [place] [address]      Provider Number: [id number]  Attending Physician Name and Address:  Campbell Riches, MD  Relative Name and Phone Number:  [name] - sister; [phone]    Current Level of Care: Hospital Recommended Level of Care: [place] (LTC resident at [facility]) Prior Approval Number:    Date Approved/Denied:   PASRR Number: [id number]  Discharge Plan: SNF    Current Diagnoses: Patient Active Problem List   Diagnosis Date Noted  . Sepsis secondary to UTI ([place]) 12/08/2020  . Spasticity 12/06/2020  . Contracture of lower leg joint 12/06/2020  . Autonomic dysfunction 08/30/2020  . Neurogenic bowel 08/30/2020  . Wheelchair dependence 08/30/2020  . 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 Medication management 06/07/2018  . Cough 06/07/2018  . Cirrhosis ([place]) 04/20/2018  . Obstipation/fecal impaction 02/12/2018  . Intractable vomiting 02/11/2018  . Chronic hepatitis C without hepatic coma ([place]) 01/10/2018  . NICM (nonischemic cardiomyopathy) ([place]) 08/25/2017  . Normal coronary arteries 08/25/2017  . Diastolic dysfunction 88/41/6606  . S/P thoracentesis   . Status post thoracentesis   . S/p percutaneous right heart catheterization   . Pleural effusion on right 06/29/2017  . Respiratory failure with hypoxia ([place]) 06/29/2017  . Pressure injury of skin 06/29/2017  . Hypokalemia 09/21/2016  . Fever in adult 07/28/2016  . Decubitus ulcer of coccyx 02/04/2016  . Hyperglycemia 12/03/2015  . H/O bilateral mastectomy 07/25/2015  . Benign hypertensive heart disease without heart failure 07/15/2015  .  Hiatal hernia 07/06/2015  . 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 Pleural effusion 07/06/2015  . History of breast cancer 08/21/2014  . Adynamic ileus ([place]) 07/22/2014  . Elevated liver enzymes 07/22/2014  . CHF (congestive heart failure) ([place]) 07/05/2014  . Gastroparesis 07/05/2014  . Microcytic anemia 01/05/2014  . Insomnia 07/14/2013  . Edema 05/01/2013  . Rheumatoid arthritis ([place]) 02/23/2013  . GERD (gastroesophageal reflux disease) 02/23/2013  . Essential hypertension 02/23/2013  . Neurogenic bladder 02/23/2013  . Paraplegia ([place]) 02/23/2013  . Depression 02/23/2013  . Allergic rhinitis 02/23/2013  . Constipation 02/23/2013  . 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[place]) 09/24/2011  . Breast cancer, left breast ([place]) 09/08/2011    Orientation RESPIRATION BLADDER Height & Weight     Self,Time,Situation,Place  Normal Continent Weight: 173 lb 1 oz (78.5 kg) Height:     BEHAVIORAL SYMPTOMS/MOOD NEUROLOGICAL BOWEL NUTRITION STATUS      Incontinent Diet (Heart healthy)  AMBULATORY STATUS COMMUNICATION OF NEEDS Skin   Extensive Assist (RLE and LLE para;ysis contracture) Verbally Normal                       Personal Care Assistance Level of Assistance  Bathing,Feeding,Dressing Bathing Assistance: Limited assistance Feeding assistance: Limited assistance Dressing Assistance: Limited assistance     Functional Limitations Info  Sight,Hearing,Speech Sight Info: Impaired Hearing Info: Adequate Speech Info: Adequate    SPECIAL CARE FACTORS FREQUENCY                       Contractures Contractures Info: Present (RLE and LLE paralysis contracture)    Additional Factors Info  Code  Status,Allergies Code Status Info: Full Allergies Info: No known allergies           Current Medications (12/11/2020):  This is the current hospital active medication list Current Facility-Administered Medications  Medication Dose Route Frequency  Provider Last Rate Last Admin  . acetaminophen (TYLENOL) tablet 650 mg  650 mg Oral Q6H PRN Jeralyn Bennett, MD   650 mg at 12/10/20 1720   Or  . acetaminophen (TYLENOL) suppository 650 mg  650 mg Rectal Q6H PRN Jeralyn Bennett, MD      . albuterol (VENTOLIN HFA) 108 (90 Base) MCG/ACT inhaler 1-2 puff  1-2 puff Inhalation Q6H PRN Jeralyn Bennett, MD      . baclofen (LIORESAL) tablet 10 mg  10 mg Oral 2 times per day Jeralyn Bennett, MD   10 mg at 12/11/20 1142   And  . baclofen (LIORESAL) tablet 15 mg  15 mg Oral 2 times per day Jeralyn Bennett, MD   15 mg at 12/11/20 0021  . bisacodyl (DULCOLAX) suppository 10 mg  10 mg Rectal Daily PRN Campbell Riches, MD   10 mg at 12/10/20 0440  . Chlorhexidine Gluconate Cloth 2 % PADS 6 each  6 each Topical Daily Jeralyn Bennett, MD   6 each at 12/11/20 1142  . enoxaparin (LOVENOX) injection 40 mg  40 mg Subcutaneous Daily Jeralyn Bennett, MD   40 mg at 12/11/20 1026  . hydroxychloroquine (PLAQUENIL) tablet 200 mg  200 mg Oral Daily Jeralyn Bennett, MD   200 mg at 12/11/20 1142  . pantoprazole (PROTONIX) EC tablet 40 mg  40 mg Oral Daily Jeralyn Bennett, MD   40 mg at 12/11/20 1026  . polyethylene glycol (MIRALAX / GLYCOLAX) packet 17 g  17 g Oral Daily PRN Jeralyn Bennett, MD      . sertraline (ZOLOFT) tablet 50 mg  50 mg Oral Daily Jeralyn Bennett, MD   50 mg at 12/11/20 1026  . sodium chloride flush (NS) 0.9 % injection 3 mL  3 mL Intravenous Q12H Jeralyn Bennett, MD   3 mL at 12/11/20 1143  . sucralfate (CARAFATE) 1 GM/10ML suspension 1 g  1 g Oral Q6H PRN Jeralyn Bennett, MD   1 g at 12/10/20 1222  . tiZANidine (ZANAFLEX) tablet 4 mg  4 mg Oral TID Jeralyn Bennett, MD   4 mg at 12/11/20 1026  . traMADol (ULTRAM) tablet 50 mg  50 mg Oral Q6H PRN Jeralyn Bennett, MD      . white petrolatum (VASELINE) gel   Topical PRN Jeralyn Bennett, MD   0.2 application at 11/94/17 1056     Discharge Medications: Please see discharge summary for a  list of discharge medications.  Relevant Imaging Results:  Relevant Lab Results:   Additional Information [id number]  [place], [place], LCSW

## 2020-12-11 NOTE — Plan of Care (Signed)
  Problem: Education: Goal: Knowledge of General Education information will improve Description: Including pain rating scale, medication(s)/side effects and non-pharmacologic comfort measures Outcome: Adequate for Discharge   Problem: Health Behavior/Discharge Planning: Goal: Ability to manage health-related needs will improve 12/11/2020 1251 by Dolores Hoose, RN Outcome: Adequate for Discharge 12/11/2020 0737 by Dolores Hoose, RN Outcome: Progressing   Problem: Clinical Measurements: Goal: Ability to maintain clinical measurements within normal limits will improve Outcome: Adequate for Discharge Goal: Will remain free from infection Outcome: Adequate for Discharge Goal: Diagnostic test results will improve 12/11/2020 1251 by Dolores Hoose, RN Outcome: Adequate for Discharge 12/11/2020 0737 by Dolores Hoose, RN Outcome: Progressing Goal: Respiratory complications will improve Outcome: Adequate for Discharge Goal: Cardiovascular complication will be avoided Outcome: Adequate for Discharge   Problem: Activity: Goal: Risk for activity intolerance will decrease 12/11/2020 1251 by Dolores Hoose, RN Outcome: Adequate for Discharge 12/11/2020 0737 by Dolores Hoose, RN Outcome: Progressing   Problem: Nutrition: Goal: Adequate nutrition will be maintained Outcome: Adequate for Discharge   Problem: Coping: Goal: Level of anxiety will decrease Outcome: Adequate for Discharge   Problem: Elimination: Goal: Will not experience complications related to bowel motility Outcome: Adequate for Discharge Goal: Will not experience complications related to urinary retention Outcome: Adequate for Discharge   Problem: Pain Managment: Goal: General experience of comfort will improve Outcome: Adequate for Discharge   Problem: Safety: Goal: Ability to remain free from injury will improve Outcome: Adequate for Discharge   Problem: Skin Integrity: Goal: Risk for impaired  skin integrity will decrease Outcome: Adequate for Discharge

## 2020-12-11 NOTE — Progress Notes (Signed)
Attempted to give report to [facility] staff, transferred to floor number  But I was  put on hold for more than 15 minutes. I gave report to RN incoming staff , that I was unable to give report.[name]

## 2020-12-11 NOTE — Plan of Care (Signed)
  Problem: Health Behavior/Discharge Planning: Goal: Ability to manage health-related needs will improve Outcome: Progressing   Problem: Clinical Measurements: Goal: Diagnostic test results will improve Outcome: Progressing   Problem: Activity: Goal: Risk for activity intolerance will decrease Outcome: Progressing

## 2020-12-13 LAB — CULTURE, BLOOD (ROUTINE X 2)
Culture: NO GROWTH
Culture: NO GROWTH
Special Requests: ADEQUATE

## 2020-12-16 LAB — CHLAMYDIA PANEL SERUM
C. Pneumoniae IgG Serum: <1:100 {titer}
C. Pneumoniae IgM Serum: <1:10 {titer}
C. Psittaci IgG Serum: <1:100 {titer}
C. Psittaci IgM Serum: <1:10 {titer}
C. Trachomatis IgG Serum: 1:320 {titer} — AB
C. Trachomatis IgM Serum: <1:10 {titer}

## 2021-01-13 ENCOUNTER — Other Ambulatory Visit: Payer: Self-pay

## 2021-01-13 ENCOUNTER — Inpatient Hospital Stay: Payer: Medicare Other | Attending: Oncology | Admitting: Oncology

## 2021-01-13 VITALS — BP 118/82 | HR 92 | Temp 98.0°F | Resp 18 | Ht 65.0 in

## 2021-01-13 DIAGNOSIS — C220 Liver cell carcinoma: Secondary | ICD-10-CM | POA: Diagnosis present

## 2021-01-13 DIAGNOSIS — G8220 Paraplegia, unspecified: Secondary | ICD-10-CM | POA: Insufficient documentation

## 2021-01-13 DIAGNOSIS — Z79899 Other long term (current) drug therapy: Secondary | ICD-10-CM | POA: Diagnosis not present

## 2021-01-13 DIAGNOSIS — Z9013 Acquired absence of bilateral breasts and nipples: Secondary | ICD-10-CM | POA: Diagnosis not present

## 2021-01-13 DIAGNOSIS — K219 Gastro-esophageal reflux disease without esophagitis: Secondary | ICD-10-CM | POA: Diagnosis not present

## 2021-01-13 DIAGNOSIS — Z8619 Personal history of other infectious and parasitic diseases: Secondary | ICD-10-CM | POA: Insufficient documentation

## 2021-01-13 DIAGNOSIS — I509 Heart failure, unspecified: Secondary | ICD-10-CM | POA: Diagnosis not present

## 2021-01-13 DIAGNOSIS — Z853 Personal history of malignant neoplasm of breast: Secondary | ICD-10-CM | POA: Diagnosis not present

## 2021-01-13 DIAGNOSIS — K7460 Unspecified cirrhosis of liver: Secondary | ICD-10-CM | POA: Diagnosis not present

## 2021-01-13 DIAGNOSIS — F32A Depression, unspecified: Secondary | ICD-10-CM | POA: Insufficient documentation
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
## 2021-01-14 ENCOUNTER — Ambulatory Visit: Payer: Medicare Other | Admitting: Physical Medicine & Rehabilitation

## 2021-01-23 ENCOUNTER — Other Ambulatory Visit: Payer: Self-pay

## 2021-01-23 ENCOUNTER — Ambulatory Visit
Admission: RE | Admit: 2021-01-23 | Discharge: 2021-01-23 | Disposition: A | Discharge status: Home / Self Care | Payer: Medicare Other | Source: Ambulatory Visit | Attending: Oncology | Admitting: Oncology

## 2021-01-23 ENCOUNTER — Encounter: Payer: Self-pay | Admitting: *Deleted

## 2021-01-23 DIAGNOSIS — C220 Liver cell carcinoma: Secondary | ICD-10-CM

## 2021-01-23 HISTORY — PX: IR RADIOLOGIST EVAL & MGMT: IMG5224
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
## 2021-02-04 ENCOUNTER — Other Ambulatory Visit (HOSPITAL_COMMUNITY): Payer: Self-pay | Admitting: Interventional Radiology

## 2021-02-04 DIAGNOSIS — C220 Liver cell carcinoma: Secondary | ICD-10-CM

## 2021-02-05 ENCOUNTER — Encounter (HOSPITAL_COMMUNITY): Payer: Self-pay | Admitting: Emergency Medicine

## 2021-02-05 ENCOUNTER — Emergency Department (HOSPITAL_COMMUNITY)
Admission: EM | Admit: 2021-02-05 | Discharge: 2021-02-06 | Disposition: A | Discharge status: Home / Self Care | Payer: Medicare Other | Attending: Emergency Medicine | Admitting: Emergency Medicine

## 2021-02-05 ENCOUNTER — Emergency Department (HOSPITAL_COMMUNITY): Payer: Medicare Other

## 2021-02-05 DIAGNOSIS — I5021 Acute systolic (congestive) heart failure: Secondary | ICD-10-CM | POA: Insufficient documentation

## 2021-02-05 DIAGNOSIS — Z853 Personal history of malignant neoplasm of breast: Secondary | ICD-10-CM | POA: Diagnosis not present

## 2021-02-05 DIAGNOSIS — Z7189 Other specified counseling: Secondary | ICD-10-CM

## 2021-02-05 DIAGNOSIS — R0602 Shortness of breath: Secondary | ICD-10-CM | POA: Diagnosis present

## 2021-02-05 DIAGNOSIS — N189 Chronic kidney disease, unspecified: Secondary | ICD-10-CM | POA: Diagnosis not present

## 2021-02-05 DIAGNOSIS — Z87891 Personal history of nicotine dependence: Secondary | ICD-10-CM | POA: Insufficient documentation

## 2021-02-05 DIAGNOSIS — J069 Acute upper respiratory infection, unspecified: Secondary | ICD-10-CM

## 2021-02-05 DIAGNOSIS — Z20822 Contact with and (suspected) exposure to covid-19: Secondary | ICD-10-CM | POA: Insufficient documentation

## 2021-02-05 DIAGNOSIS — I130 Hypertensive heart and chronic kidney disease with heart failure and stage 1 through stage 4 chronic kidney disease, or unspecified chronic kidney disease: Secondary | ICD-10-CM | POA: Diagnosis not present

## 2021-02-05 DIAGNOSIS — Z79899 Other long term (current) drug therapy: Secondary | ICD-10-CM | POA: Insufficient documentation

## 2021-02-05 LAB — CBC WITH DIFFERENTIAL/PLATELET
Abs Immature Granulocytes: 0.05 10*3/uL (ref 0.00–0.07)
Basophils Absolute: 0 10*3/uL (ref 0.0–0.1)
Basophils Relative: 1 %
Eosinophils Absolute: 0.3 10*3/uL (ref 0.0–0.5)
Eosinophils Relative: 3 %
HCT: 40.8 % (ref 36.0–46.0)
Hemoglobin: 13.1 g/dL (ref 12.0–15.0)
Immature Granulocytes: 1 %
Lymphocytes Relative: 22 %
Lymphs Abs: 1.8 10*3/uL (ref 0.7–4.0)
MCH: 27.1 pg (ref 26.0–34.0)
MCHC: 32.1 g/dL (ref 30.0–36.0)
MCV: 84.3 fL (ref 80.0–100.0)
Monocytes Absolute: 0.6 10*3/uL (ref 0.1–1.0)
Monocytes Relative: 8 %
Neutro Abs: 5.6 10*3/uL (ref 1.7–7.7)
Neutrophils Relative %: 65 %
Platelets: 262 10*3/uL (ref 150–400)
RBC: 4.84 MIL/uL (ref 3.87–5.11)
RDW: 14.5 % (ref 11.5–15.5)
WBC: 8.4 10*3/uL (ref 4.0–10.5)
nRBC: 0 % (ref 0.0–0.2)

## 2021-02-05 LAB — BASIC METABOLIC PANEL
Anion gap: 10 (ref 5–15)
BUN: 24 mg/dL — ABNORMAL HIGH (ref 8–23)
CO2: 23 mmol/L (ref 22–32)
Calcium: 10.3 mg/dL (ref 8.9–10.3)
Chloride: 101 mmol/L (ref 98–111)
Creatinine, Ser: 0.79 mg/dL (ref 0.44–1.00)
GFR, Estimated: >60 mL/min (ref >60)
Glucose, Bld: 91 mg/dL (ref 70–99)
Potassium: 4.5 mmol/L (ref 3.5–5.1)
Sodium: 134 mmol/L — ABNORMAL LOW (ref 135–145)

## 2021-02-05 LAB — TROPONIN I (HIGH SENSITIVITY)
Troponin I (High Sensitivity): 4 ng/L (ref <18)
Troponin I (High Sensitivity): 3 ng/L (ref <18)

## 2021-02-05 MED ORDER — BENZONATATE 100 MG PO CAPS: 100.0000 mg | ORAL_CAPSULE | Freq: Three times a day (TID) | ORAL | 0 refills | Status: DC

## 2021-02-05 MED ORDER — IOHEXOL 350 MG/ML SOLN
75.0000 mL | Freq: Once | INTRAVENOUS | Status: AC | PRN
  Administered 2021-02-05: 75 mL via INTRAVENOUS

## 2021-02-05 MED ORDER — IOHEXOL 300 MG/ML  SOLN: 100.0000 mL | Freq: Once | INTRAMUSCULAR | Status: DC | PRN

## 2021-02-05 NOTE — ED Notes (Signed)
[facility] called  

## 2021-02-05 NOTE — ED Notes (Signed)
Attempted report to [facility] x1
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
## 2021-02-05 NOTE — Discharge Instructions (Addendum)
We will contact you with the results of your COVID test when it is available if it is positive. Follow-up with your primary care provider. Take the [facility] as needed for cough. Return to the ER if you start to experience chest pain, worsening shortness of breath, leg swelling.

## 2021-02-05 NOTE — ED Triage Notes (Signed)
Pt from [facility] with reports of SOB for a week. Had a normal CXR but sent for CT scan. Baseline on 2L O2. Endorses sharp back pain that moved to her shoulder.

## 2021-02-06 LAB — SARS CORONAVIRUS 2 (TAT 6-24 HRS): SARS Coronavirus 2: NEGATIVE

## 2021-02-07 ENCOUNTER — Encounter
Payer: Medicare Other | Attending: Physical Medicine and Rehabilitation | Admitting: Physical Medicine and Rehabilitation

## 2021-02-07 ENCOUNTER — Other Ambulatory Visit: Payer: Self-pay

## 2021-02-07 ENCOUNTER — Encounter: Payer: Self-pay | Admitting: Physical Medicine and Rehabilitation

## 2021-02-07 VITALS — BP 102/69 | HR 86 | Temp 99.0°F | Ht 65.0 in

## 2021-02-07 DIAGNOSIS — C220 Liver cell carcinoma: Secondary | ICD-10-CM | POA: Diagnosis present

## 2021-02-07 DIAGNOSIS — R059 Cough, unspecified: Secondary | ICD-10-CM

## 2021-02-07 DIAGNOSIS — Z993 Dependence on wheelchair: Secondary | ICD-10-CM

## 2021-02-07 DIAGNOSIS — K219 Gastro-esophageal reflux disease without esophagitis: Secondary | ICD-10-CM | POA: Diagnosis present

## 2021-02-07 DIAGNOSIS — G8220 Paraplegia, unspecified: Secondary | ICD-10-CM

## 2021-02-07 DIAGNOSIS — G909 Disorder of the autonomic nervous system, unspecified: Secondary | ICD-10-CM

## 2021-02-07 DIAGNOSIS — K449 Diaphragmatic hernia without obstruction or gangrene: Secondary | ICD-10-CM
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
## 2021-02-11 ENCOUNTER — Encounter (HOSPITAL_COMMUNITY): Payer: Self-pay | Admitting: Interventional Radiology

## 2021-02-11 NOTE — Progress Notes (Addendum)
COVID Vaccine Completed:Yes Date COVID Vaccine completed: x2 Has received booster:x1 COVID vaccine manufacturer:    Moderna    Date of COVID positive in last 90 days: No  PCP - Caprice Renshaw, MD, last office note from Dr. Jerilynn Mages. [place] in epic 02/07/2021 Cardiologist - N/A  Chest x-ray - 02/05/2021 in epic EKG - 02/05/2021 in epic Stress Test - N/A ECHO - 06/29/2017 in epic Cardiac Cath - 07/02/2017 in epic Pacemaker/ICD device last checked:N/A  Sleep Study - N/A CPAP - N/A  Fasting Blood Sugar - N/A Checks Blood Sugar __N/A___ times a day  Blood Thinner Instructions:N/A Aspirin Instructions: yes Last Dose: 03/01/2021  Activity level: Paraplegic     Anesthesia review: atelectasis in R lower lobe, CHF, Paraplegic, CKD  Patient denies shortness of breath, fever, cough and chest pain at PAT appointment   Patient verbalized understanding of instructions that were given to them at the PAT appointment. Patient was also instructed that they will need to review over the PAT instructions again at home before surgery.

## 2021-02-11 NOTE — Progress Notes (Signed)
Sent message, via epic in basket, requesting orders in epic from surgeon.  

## 2021-02-14 ENCOUNTER — Encounter: Payer: Self-pay | Admitting: Gastroenterology

## 2021-02-18 ENCOUNTER — Telehealth: Payer: Self-pay | Admitting: *Deleted

## 2021-02-18 NOTE — Telephone Encounter (Signed)
Designer, television/film set from [employer] called and reports that Koi's foley is out and she is refusing to have it put back in.  She reports they replace it on the 15th of every month and a 16 fr replaced and fell out. They got order for 18 fr but Raenell is refusing to have it replaced.  If it is to be replaced they are goint to have to send her out to the hospital to have it done. Please advise.

## 2021-02-19 NOTE — Telephone Encounter (Signed)
I spoke with [name]  The did call the urologist and replaced the foley as instructed.

## 2021-02-20 ENCOUNTER — Encounter (HOSPITAL_COMMUNITY): Payer: Self-pay | Admitting: Interventional Radiology

## 2021-02-20 NOTE — Patient Instructions (Addendum)
Preop instructions for: [name]  Date of Birth: 05-06-1951                      Date of Procedure:   03/05/2021 Procedure:   CT MICROWAVE ABLATION LIVER   Surgeon: Dr. Corrie Mckusick Facility contact: [place] at [facility]     Phone:  [phone]               Health Care SRP:RXYVOPF [place] [phone] RN contact name/phone#:   Mariam RN/DON                       and Fax #: [phone]   Transportation contact phone#: [place] [facility] [place]     Time to arrive at [facility]: 6:30AM   Report to: Admitting (On your left hand side)    Do not eat or drink past midnight the night before your procedure.(To include any tube feedings-must be discontinued)   Take these morning medications only with sips of water.(or give through gastrostomy or feeding tube). Baclofen, Carvedilol, Senna, Linzess Sertraline, Omeprazole Oxubutynin, Tizanidine, Bring Inhalers  Please send day of procedure:current med list and meds last taken that day, confirm nothing by mouth status from what time, Patient Demographic info( to include DNR status, problem list, allergies)   Bring Insurance card and picture ID Leave all jewelry and other valuables at place where living( no metal or rings to be worn) No contact lens Women-no make-up, no lotions,perfumes,powders  Any questions day of procedure,call  SHORT STAY-[phone]     Sent from :[facility]                   [phone]                   Fax:[phone]   Sent by :   Harlon Flor BSN, RN

## 2021-02-27 ENCOUNTER — Encounter (HOSPITAL_COMMUNITY): Payer: Self-pay | Admitting: Physician Assistant
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
## 2021-02-28 ENCOUNTER — Telehealth: Payer: Self-pay | Admitting: Cardiology

## 2021-02-28 NOTE — Telephone Encounter (Signed)
   Name: [name]  DOB: 1951/05/30  MRN: [id number]  Primary Cardiologist: Minus Breeding, MD  Chart reviewed as part of pre-operative protocol coverage. Because of [name]'s past medical history and time since last visit, she will require a follow-up visit in order to better assess preoperative cardiovascular risk.  Pre-op covering staff: - Patient already has an upcoming appointment with Almyra Deforest, PA 03/25/21. Please add "pre-op clearance" to the appointment notes so provider is aware. - Please contact requesting surgeon's office via preferred method (i.e, phone, fax) to inform them of need for appointment prior to surgery.  If applicable, this message will also be routed to pharmacy pool and/or primary cardiologist for input on holding anticoagulant/antiplatelet agent as requested below so that this information is available to the clearing provider at time of patient's appointment.   Kathyrn Drown, NP  02/28/2021, 4:14 PM

## 2021-02-28 NOTE — Telephone Encounter (Signed)
   [facility] Pre-operative Risk Assessment    Patient Name: [name]  DOB: Oct 06, 1950 MRN: [id number]  HEARTCARE STAFF:  - IMPORTANT!!!!!! Under Visit Info/Reason for Call, type in Other and utilize the format Clearance MM/DD/YY or Clearance TBD. Do not use dashes or single digits. - Please review there is not already an duplicate clearance open for this procedure. - If request is for dental extraction, please clarify the # of teeth to be extracted. - If the patient is currently at the dentist's office, call Pre-Op Callback Staff (MA/nurse) to input urgent request.  - If the patient is not currently in the dentist office, please route to the Pre-Op pool.  Request for surgical clearance:  What type of surgery is being performed? Microwave ablation  When is this surgery scheduled? TBA   What type of clearance is required (medical clearance vs. Pharmacy clearance to hold med vs. Both)? Medical   Are there any medications that need to be held prior to surgery and how long? no  Practice name and name of physician performing surgery? [facility] / Dr. Earleen Newport   What is the office phone number? [phone]   7.   What is the office fax number? [phone]  8.   Anesthesia type (None, local, MAC, general) ? General    Kamira J Martinique 02/28/2021, 3:57 PM  _________________________________________________________________   (provider comments below)

## 2021-02-28 NOTE — Telephone Encounter (Signed)
Will route back to the requesting surgeon's office to make them aware that pt will be seen in office for surgical clearance, 03/25/2021.

## 2021-03-03 ENCOUNTER — Other Ambulatory Visit: Payer: Self-pay

## 2021-03-03 ENCOUNTER — Ambulatory Visit (INDEPENDENT_AMBULATORY_CARE_PROVIDER_SITE_OTHER): Payer: Medicare Other | Admitting: Primary Care

## 2021-03-03 ENCOUNTER — Encounter: Payer: Self-pay | Admitting: Primary Care

## 2021-03-03 ENCOUNTER — Ambulatory Visit (INDEPENDENT_AMBULATORY_CARE_PROVIDER_SITE_OTHER): Payer: Medicare Other

## 2021-03-03 VITALS — BP 100/68 | HR 84 | Temp 98.1°F | Ht 65.5 in | Wt 167.0 lb

## 2021-03-03 DIAGNOSIS — R0602 Shortness of breath: Secondary | ICD-10-CM

## 2021-03-03 NOTE — Patient Instructions (Addendum)
Dyspnea unclear etiology, checking CXR/labs and PFTs - We will call her sister with results  No medications changes today    Recommendations: Use albuterol every 4-6 hours as needed for shortness of breth Continue Lasix 51m twice a day Continue Spironolactone 1027mdaily  Continue Plaquenil as directed   Orders:  CXR re: shortness of breat Labs (CMET, BNP, CBC) Pulmonary function testing   Follow-up: Please schedule visit in September with Dr. SoHalford Chessmannd PFTs

## 2021-03-03 NOTE — Assessment & Plan Note (Addendum)
-   Patient reports increased shortness of breath x 2 months. Unclear etiology, suspected there may be a component of heart failure contributing. She is compliant with Lasix 42m twice daily and Spirolactone 1065mdaily. Recommend she try using Albuterol hfa 2 puffs every 6 hours as needed for shortness of breath/chest tightness. Checking labs (bnp,cmet,cbc), CXR  and pulmonary function testing. FU in 2-4 weeks.

## 2021-03-03 NOTE — Progress Notes (Signed)
@Patient  ID: [name], female    DOB: [name] 02, 1951, 70 y.o.   MRN: [id number]  Chief Complaint  Patient presents with   Follow-up    Patient reports shortness of breath with exertion since June. Laying down helps some.     Referring provider: Caprice Renshaw, MD  HPI: 70 year old female, former smoker quit 1997.  Past medical history significant for congestive heart failure, hypertension, nonischemic cardiomyopathy, pleural effusion on right, respiratory failure with hypoxia, chronic hep C, cirrhosis. Patient of Dr. Halford Chessman, last seen in September 2019.   03/03/2021- Interim hx  Patient presents today for an overdue follow-up. She lives at assisted living. She is vaccinated. She had covid in 2020. She was last seen in our office in September 2019.  She reports increased shortness of breath since June 2022. She also has a new dry cough with associated chest tightness.  She had a chest xray which is not available to me today that reportedly showed atectasis right base. She has a hisotry of liver cirrhosis. She has issues with constipation, typically has a BM once every 3-4 days and often needs to use suppository or enema. She will be seeing cardiology at the end of the month. She has no leg swelling. She is on Spironolactone 135m daily and Lasix 452mtwice daily. She has albuterol inhaler available but has never used it. No PFTs on file. Denies fever, chills, sweats, wheezing.    No Known Allergies  Immunization History  Administered Date(s) Administered   Hepatitis A, Adult 04/20/2018   Hepatitis B, adult 04/20/2018   Influenza,inj,Quad PF,6+ Mos 04/20/2018, 04/24/2019   Moderna Sars-Covid-2 Vaccination 10/29/2019, 11/28/2019   Pneumococcal Polysaccharide-23 04/20/2018    Past Medical History:  Diagnosis Date   Acute systolic CHF (congestive heart failure) ([facility]12/09/2016   EF was normal 2016, now 20% with grade 2 diastolic dysfunction, no significant CAD at cath   Atherosclerotic  heart disease of native coronary artery without angina pectoris    CKD (chronic kidney disease)    Constipation    Depression    Diaphragmatic hernia without mention of obstruction or gangrene 08/29/2010   Edema 05/20/2009   Foley catheter in place    Gastroparesis 10/05/2010   GERD (gastroesophageal reflux disease) 02/02/2009   Hepatitis C 02/14/2018   History of hiatal hernia    Hx of colonic polyps    Hypertension 10/12/2008   Hypotension, unspecified 05/20/2009   Immobility syndrome (paraplegic) 1977   iron def anemia 10/12/2008   Iron deficiency anemia, unspecified 10/12/2008   Leiomyoma of uterus    Liver cell carcinoma ([facility]   Malignant neoplasm of breast (female), unspecified site 10/05/2010   2003, 2013    Mass of right lobe of liver    Nausea    Neuromuscular dysfunction of bladder    OAB (overactive bladder)    Paraplegia (HCC)    Personal history of COVID-19 07/28/2019   Pleural effusion    Pneumonia    Respiratory failure (HCC)    Rheumatoid arthritis ([facility]06/24/2010    Tobacco History: Social History   Tobacco Use  Smoking Status Former   Packs/day: 1.00   Years: 10.00   Pack years: 10.00   Types: Cigarettes   Quit date: 07/28/1995   Years since quitting: 25.6  Smokeless Tobacco Never   Counseling given: Not Answered   Outpatient Medications Prior to Visit  Medication Sig Dispense Refill   acetaminophen (TYLENOL) 325 MG tablet Take 650 mg by mouth every 6 (  six) hours as needed for mild pain or fever.     albuterol (VENTOLIN HFA) 108 (90 Base) MCG/ACT inhaler Inhale 2 puffs into the lungs every 4 (four) hours as needed for wheezing or shortness of breath.     alum & mag hydroxide-simeth (MAALOX PLUS) 400-400-40 MG/5ML suspension Take 10 mLs by mouth every 6 (six) hours as needed for indigestion (nausea, Gas).     baclofen (LIORESAL) 10 MG tablet Take 10-15 mg by mouth See admin instructions. 10 mg  bid at 6 am and 12 pm 15 mg bid at 12 am and 6 pm      Benzocaine 10 MG LOZG Use as directed 1 lozenge in the mouth or throat every 2 (two) hours as needed.     benzonatate (TESSALON) 100 MG capsule Take 1 capsule (100 mg total) by mouth every 8 (eight) hours. 21 capsule 0   bisacodyl (DULCOLAX) 10 MG suppository Place 1 suppository (10 mg total) rectally every Tuesday, Thursday, Saturday, and Sunday at 6 PM. 30 suppository 3   Brimonidine Tartrate (LUMIFY) 0.025 % SOLN Place 1 drop into both eyes daily as needed (redness).     calcium-vitamin D (OSCAL WITH D) 500-200 MG-UNIT tablet Take 1 tablet by mouth 2 (two) times daily.      Carboxymethylcellulose Sod PF 0.5 % SOLN Place 1 drop into both eyes in the morning, at noon, in the evening, and at bedtime.     carvedilol (COREG) 12.5 MG tablet Take 1 tablet (12.5 mg total) by mouth 2 (two) times daily with a meal. 180 tablet 3   carvedilol (COREG) 6.25 MG tablet Take 1 tablet (6.25 mg total) by mouth 2 (two) times daily. Take with 12.5 to make 18.75 twice daily. 180 tablet 3   cetirizine (ZYRTEC) 10 MG tablet Take 10 mg by mouth daily as needed for allergies.      cholecalciferol (VITAMIN D) 1000 units tablet Take 1,000 Units by mouth daily.     Cranberry 450 MG TABS Take 450 mg by mouth daily.     desonide (DESOWEN) 0.05 % ointment Apply topically.     diclofenac Sodium (VOLTAREN) 1 % GEL Apply 2 g topically in the morning, at noon, and at bedtime.     docusate sodium (COLACE) 100 MG capsule Take 100 mg by mouth in the morning, at noon, and at bedtime.     furosemide (LASIX) 40 MG tablet Take 40 mg by mouth 2 (two) times daily.      hydrocortisone 2.5 % cream Apply 1 application topically 2 (two) times daily as needed (itching). To arms and legs     hydroxychloroquine (PLAQUENIL) 200 MG tablet Take 200 mg by mouth daily.      linaclotide (LINZESS) 290 MCG CAPS capsule Take 290 mcg by mouth daily before breakfast.     losartan (COZAAR) 25 MG tablet Take 25 mg by mouth daily.     lubiprostone  (AMITIZA) 24 MCG capsule Take by mouth.     Melatonin 3 MG TBDP Take 3 mg by mouth at bedtime as needed (sleeo).     Multiple Vitamin (MULTIVITAMIN WITH MINERALS) TABS tablet Take 1 tablet by mouth daily.     MYRBETRIQ 50 MG TB24 tablet Take 50 mg by mouth daily.     omeprazole (PRILOSEC) 20 MG capsule Take 20 mg by mouth 2 (two) times daily.     oxybutynin (DITROPAN-XL) 10 MG 24 hr tablet Take 10 mg by mouth daily.     oxybutynin (  DITROPAN-XL) 5 MG 24 hr tablet Take 5 mg by mouth daily.     polyethylene glycol (MIRALAX / GLYCOLAX) packet Take 17 g by mouth every Monday, Wednesday, Friday, and Saturday at 6 PM. (Patient taking differently: Take 17 g by mouth 3 (three) times a week. Monday, Wednesday & Friday) 30 each 5   Prenatal Vit-Fe Fumarate-FA (PRENATAL MULTIVITAMIN) TABS tablet Take 1 tablet by mouth daily at 12 noon.     sennosides-docusate sodium (SENOKOT-S) 8.6-50 MG tablet Take 2 tablets by mouth 2 (two) times daily. (Patient taking differently: Take 2 tablets by mouth 4 (four) times daily.) 120 tablet 3   sertraline (ZOLOFT) 50 MG tablet Take 50 mg by mouth daily.     spironolactone (ALDACTONE) 100 MG tablet Take 1 tablet (100 mg total) by mouth daily. For edema 30 tablet 2   sucralfate (CARAFATE) 1 GM/10ML suspension Take 1 g by mouth every 6 (six) hours as needed (for [place]).  420 mL 1   tiZANidine (ZANAFLEX) 4 MG tablet Take 4 mg by mouth 3 (three) times daily. For Muscle spasms     traMADol (ULTRAM) 50 MG tablet Take 1 tablet (50 mg total) by mouth every 6 (six) hours as needed. (Patient taking differently: Take 50 mg by mouth every 6 (six) hours as needed for moderate pain.) 15 tablet 0   No facility-administered medications prior to visit.    Review of Systems  Review of Systems  Constitutional: Negative.   HENT: Negative.    Respiratory:  Positive for cough, chest tightness and shortness of breath.   Cardiovascular:  Negative for leg swelling.  Gastrointestinal:  Positive  for constipation. Negative for nausea and vomiting.    Physical Exam  BP 100/68 (BP Location: Left Arm, Patient Position: Sitting, Cuff Size: Normal)   Pulse 84   Temp 98.1 F (36.7 C) (Oral)   Ht 5' 5.5" (1.664 m)   Wt 167 lb (75.8 kg) Comment: patinet in chair/  SpO2 94%   BMI 27.37 kg/m  Physical Exam Constitutional:      Appearance: Normal appearance.  HENT:     Head: Normocephalic and atraumatic.     Mouth/Throat:     Mouth: Mucous membranes are moist.     Pharynx: Oropharynx is clear.  Cardiovascular:     Rate and Rhythm: Normal rate and regular rhythm.     Comments: No edema Pulmonary:     Breath sounds: No wheezing.     Comments: Fine crackles right base  Skin:    General: Skin is warm and dry.  Neurological:     General: No focal deficit present.     Mental Status: She is alert and oriented to person, place, and time. Mental status is at baseline.  Psychiatric:        Mood and Affect: Mood normal.        Behavior: Behavior normal.        Thought Content: Thought content normal.        Judgment: Judgment normal.     Lab Results:  CBC    Component Value Date/Time   WBC 8.4 02/05/2021 1828   RBC 4.84 02/05/2021 1828   HGB 13.1 02/05/2021 1828   HCT 40.8 02/05/2021 1828   PLT 262 02/05/2021 1828   MCV 84.3 02/05/2021 1828   MCH 27.1 02/05/2021 1828   MCHC 32.1 02/05/2021 1828   RDW 14.5 02/05/2021 1828   LYMPHSABS 1.8 02/05/2021 1828   MONOABS 0.6 02/05/2021 1828   EOSABS  0.3 02/05/2021 1828   BASOSABS 0.0 02/05/2021 1828    BMET    Component Value Date/Time   NA 134 (L) 02/05/2021 1828   NA 138 06/07/2018 1411   K 4.5 02/05/2021 1828   CL 101 02/05/2021 1828   CO2 23 02/05/2021 1828   GLUCOSE 91 02/05/2021 1828   BUN 24 (H) 02/05/2021 1828   BUN 13 06/07/2018 1411   CREATININE 0.79 02/05/2021 1828   CREATININE 0.58 07/18/2018 1146   CALCIUM 10.3 02/05/2021 1828   GFRNONAA >60 02/05/2021 1828   GFRNONAA 95 07/18/2018 1146   GFRAA 113  09/07/2018 1005   GFRAA 111 07/18/2018 1146    BNP    Component Value Date/Time   BNP 16.5 12/08/2020 0641    ProBNP    Component Value Date/Time   PROBNP 82.0 09/01/2008 2158    Imaging: DG Chest 2 View  Result Date: 03/03/2021 CLINICAL DATA:  Dyspnea EXAM: CHEST - 2 VIEW COMPARISON:  February 05, 2021 and Dec 08, 2020 FINDINGS: The heart size and mediastinal contours are within normal limits. Hiatal hernia. Eventration of the right hemidiaphragm. Linear band of atelectasis in the right mid lung. No airspace consolidation. No pleural effusion. No pneumothorax. Similar focal kyphosis about a remote T8 compression deformity. Degenerative changes bilateral shoulders. 8.8 cm peripherally calcified splenic lesion, unchanged. IMPRESSION: 1. No active cardiopulmonary disease. Linear band of atelectasis in the right mid lung. 2. Hiatal hernia. Electronically Signed   By: Dahlia Bailiff MD   On: 03/03/2021 17:48   DG Chest 2 View  Result Date: 02/05/2021 CLINICAL DATA:  Shortness of breath x1 week EXAM: CHEST - 2 VIEW COMPARISON:  Dec 08, 2020 FINDINGS: Patient is rotated. The heart size and mediastinal contours are within normal limits for patient positioning. Right lower lobe opacity. Stable elevation of the right hemidiaphragm. No visible pleural effusion or pneumothorax. Remote T8 fracture with focal kyphosis. IMPRESSION: Right lower lobe opacity, atelectasis versus developing infection. Electronically Signed   By: Dahlia Bailiff MD   On: 02/05/2021 19:37   CT Angio Chest PE W/Cm &/Or Wo Cm  Result Date: 02/05/2021 CLINICAL DATA:  70 year old female with concern for pulmonary embolism. EXAM: CT ANGIOGRAPHY CHEST WITH CONTRAST TECHNIQUE: Multidetector CT imaging of the chest was performed using the standard protocol during bolus administration of intravenous contrast. Multiplanar CT image reconstructions and MIPs were obtained to evaluate the vascular anatomy. CONTRAST:  41m OMNIPAQUE IOHEXOL 350  MG/ML SOLN COMPARISON:  Chest radiograph dated 02/05/2021. FINDINGS: Cardiovascular: There is no cardiomegaly or pericardial effusion. The thoracic aorta is unremarkable. Evaluation of the pulmonary arteries is limited due to respiratory motion artifact and suboptimal visualization of the peripheral branches. No large or central pulmonary artery embolus identified. Mediastinum/Nodes: No hilar or mediastinal adenopathy. The esophagus and the thyroid gland are grossly unremarkable. No mediastinal fluid collection. Lungs/Pleura: There is eventration of the right hemidiaphragm. Right lung base linear atelectasis. No consolidative changes. There is no pleural effusion or pneumothorax. The central airways are patent. Upper Abdomen: There is moderate size hiatal hernia containing a portion of the stomach. There is a 2.5 cm rim calcified splenic lesion. Musculoskeletal: Degenerative changes of the spine. Old T8 compression fracture with anterior wedging and focal kyphosis. There is resulting grade 2 T7-T8 anterolisthesis and focal narrowing of the central canal as seen previously. No acute fracture. Review of the MIP images confirms the above findings. IMPRESSION: 1. No acute intrathoracic pathology. No CT evidence of central pulmonary artery embolus. 2. Moderate  size hiatal hernia containing a portion of the stomach. 3. Old T8 compression fracture with anterior wedging and focal kyphosis. There is resulting grade 2 T7-T8 anterolisthesis and focal narrowing of the central canal as seen previously. Electronically Signed   By: Anner Crete M.D.   On: 02/05/2021 22:37     Assessment & Plan:   Shortness of breath - Patient reports increased shortness of breath x 2 months. Unclear etiology, suspected there may be a component of heart failure contributing. She is compliant with Lasix 62m twice daily and Spirolactone 1028mdaily. Recommend she try using Albuterol hfa 2 puffs every 6 hours as needed for shortness of  breath/chest tightness. Checking labs (bnp,cmet,cbc), CXR  and pulmonary function testing. FU in 2-4 weeks.    40 mins spent on case; > 50% time spent face to face  ElMartyn EhrichNP 03/03/2021

## 2021-03-04 LAB — CBC
HCT: 36.5 % (ref 36.0–46.0)
Hemoglobin: 12.1 g/dL (ref 12.0–15.0)
MCHC: 33.2 g/dL (ref 30.0–36.0)
MCV: 80.9 fl (ref 78.0–100.0)
Platelets: 325 10*3/uL (ref 150.0–400.0)
RBC: 4.52 Mil/uL (ref 3.87–5.11)
RDW: 15.7 % — ABNORMAL HIGH (ref 11.5–15.5)
WBC: 7.7 10*3/uL (ref 4.0–10.5)

## 2021-03-04 LAB — COMPREHENSIVE METABOLIC PANEL
ALT: 23 U/L (ref 0–35)
AST: 30 U/L (ref 0–37)
Albumin: 3.9 g/dL (ref 3.5–5.2)
Alkaline Phosphatase: 71 U/L (ref 39–117)
BUN: 17 mg/dL (ref 6–23)
CO2: 27 mEq/L (ref 19–32)
Calcium: 9.9 mg/dL (ref 8.4–10.5)
Chloride: 98 mEq/L (ref 96–112)
Creatinine, Ser: 0.77 mg/dL (ref 0.40–1.20)
GFR: 78.06 mL/min (ref >60.00)
Glucose, Bld: 72 mg/dL (ref 70–99)
Potassium: 3.9 mEq/L (ref 3.5–5.1)
Sodium: 133 mEq/L — ABNORMAL LOW (ref 135–145)
Total Bilirubin: 0.3 mg/dL (ref 0.2–1.2)
Total Protein: 8.6 g/dL — ABNORMAL HIGH (ref 6.0–8.3)

## 2021-03-04 LAB — BRAIN NATRIURETIC PEPTIDE: Pro B Natriuretic peptide (BNP): 17 pg/mL (ref 0.0–100.0)

## 2021-03-04 NOTE — Progress Notes (Signed)
Reviewed and agree with assessment/plan.   Chesley Mires, MD [facility] Pulmonary/Critical Care 03/04/2021, 8:57 AM Pager:  [phone]

## 2021-03-04 NOTE — Progress Notes (Signed)
Please let patient's sister know CXR showed no active disease, linear band atelectasis right mid lung which is what was seen on her previous CXR. Encourage she use incentive spirometer 10x/hour while awake.   Incidental findings hiatal hernia, degenerative changes bil shoulders, kyphosis, T8 compression deformity, calcified splenic lesion unchanged  See awaiting labs

## 2021-03-05 ENCOUNTER — Ambulatory Visit (HOSPITAL_COMMUNITY)
Admission: RE | Admit: 2021-03-05 | Payer: Medicare Other | Source: Home / Self Care | Admitting: Interventional Radiology

## 2021-03-05 ENCOUNTER — Ambulatory Visit (HOSPITAL_COMMUNITY): Payer: Medicare Other

## 2021-03-05 ENCOUNTER — Encounter (HOSPITAL_COMMUNITY): Admission: RE | Payer: Self-pay | Source: Home / Self Care

## 2021-03-05 HISTORY — DX: Presence of other specified devices: Z97.8

## 2021-03-05 HISTORY — DX: Liver cell carcinoma: C22.0

## 2021-03-05 HISTORY — DX: Overactive bladder: N32.81

## 2021-03-05 HISTORY — DX: Neuromuscular dysfunction of bladder, unspecified: N31.9

## 2021-03-05 HISTORY — DX: Constipation, unspecified: K59.00

## 2021-03-05 HISTORY — DX: Personal history of other diseases of the digestive system: Z87.19

## 2021-03-05 HISTORY — DX: Nausea: R11.0

## 2021-03-05 HISTORY — DX: Atherosclerotic heart disease of native coronary artery without angina pectoris: I25.10

## 2021-03-05 HISTORY — DX: Hepatomegaly, not elsewhere classified: R16.0

## 2021-03-05 SURGERY — RADIO FREQUENCY ABLATION
Anesthesia: General

## 2021-03-05 NOTE — Progress Notes (Signed)
Please let patient's sister know her labs were normal (CBC, BNP). Sodium was slightly low, would recommend she salt her food extra and follow-up with PCP- non urgent. I sent you CXR result yesterday so please relay those if you haven't already. Thanks

## 2021-03-10 ENCOUNTER — Telehealth: Payer: Self-pay | Admitting: Primary Care

## 2021-03-10 NOTE — Telephone Encounter (Signed)
Please let patient's sister know CXR showed no active disease, linear band atelectasis right mid lung which is what was seen on her previous CXR. Encourage she use incentive spirometer 10x/hour while awake.    Incidental findings hiatal hernia, degenerative changes bil shoulders, kyphosis, T8 compression deformity, calcified splenic lesion unchanged    Attempted to call the pt but no answer.  Will try back later.

## 2021-03-10 NOTE — Telephone Encounter (Signed)
Pt is returning phone call in regards to chest x-ray results. Pls regard; [phone].

## 2021-03-11 NOTE — Telephone Encounter (Signed)
Called and spoke with Patient.  CXR and lab results given.  Understanding stated.  LOV recommendations reviewed.  Offered to schedule PFT and OV with Dr. Halford Chessman.  Patient stated someone will call to schedule. Nothing further at this time.  Results 03/05/21-Beth, NP Please let patient's sister know her labs were normal (CBC, BNP). Sodium was slightly low, would recommend she salt her food extra and follow-up with PCP- non urgent. I sent you CXR result yesterday so please relay those if you haven't already. Thanks

## 2021-03-13 ENCOUNTER — Encounter: Payer: Self-pay | Admitting: Physical Medicine & Rehabilitation

## 2021-03-13 ENCOUNTER — Other Ambulatory Visit: Payer: Self-pay

## 2021-03-13 ENCOUNTER — Encounter: Payer: Medicare Other | Attending: Physical Medicine and Rehabilitation | Admitting: Physical Medicine & Rehabilitation

## 2021-03-13 VITALS — BP 105/73 | HR 76 | Temp 98.3°F | Ht 65.5 in

## 2021-03-13 DIAGNOSIS — R252 Cramp and spasm: Secondary | ICD-10-CM | POA: Diagnosis present

## 2021-03-13 DIAGNOSIS — G8220 Paraplegia, unspecified: Secondary | ICD-10-CM | POA: Diagnosis present

## 2021-03-13 NOTE — Progress Notes (Signed)
Botox Injection for spasticity using needle [name] guidance  Dilution: 50 Units/ml Indication: Severe spasticity which interferes with ADL,mobility and/or  hygiene and is unresponsive to medication management and other conservative care Informed consent was obtained after describing risks and benefits of the procedure with the patient. This includes bleeding, bruising, infection, excessive weakness, or medication side effects. A REMS form is on file and signed. Needle: 25g 2" needle electrode Number of units per muscle Right Add Long 50 Right Add Mag  100 Left Add Long 50 Left Add Mag  100 All injections were done after obtaining appropriate [name] activity and after negative drawback for blood. The patient tolerated the procedure well. Post procedure instructions were given. A followup appointment was made.    Right side with Spontaneous [name] activity and response to E stim Left side has absent [name] activity and minimal response to E stim

## 2021-03-13 NOTE — Patient Instructions (Addendum)
You received a Botox injection today. You may experience soreness at the needle injection sites. Please call us if any of the injection sites turns red after a couple days or if there is any drainage. You may experience muscle weakness as a result of Botox. This would improve with time but can take several weeks to improve. The Botox should start working in about one week. The Botox usually last 3 months. The injection can be repeated every 3 months as needed. Dr Dagoberto Ligas will see you in 2 mo and monitor effect

## 2021-03-25 ENCOUNTER — Other Ambulatory Visit: Payer: Self-pay

## 2021-03-25 ENCOUNTER — Encounter: Payer: Self-pay | Admitting: Physician Assistant

## 2021-03-25 ENCOUNTER — Ambulatory Visit (INDEPENDENT_AMBULATORY_CARE_PROVIDER_SITE_OTHER): Payer: Medicare Other | Admitting: Physician Assistant

## 2021-03-25 VITALS — BP 96/69 | HR 85 | Ht 65.5 in

## 2021-03-25 DIAGNOSIS — I428 Other cardiomyopathies: Secondary | ICD-10-CM | POA: Diagnosis not present

## 2021-03-25 DIAGNOSIS — I10 Essential (primary) hypertension: Secondary | ICD-10-CM | POA: Diagnosis not present

## 2021-03-25 DIAGNOSIS — Z01818 Encounter for other preprocedural examination: Secondary | ICD-10-CM

## 2021-03-25 DIAGNOSIS — C220 Liver cell carcinoma: Secondary | ICD-10-CM
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
## 2021-03-25 NOTE — Patient Instructions (Signed)
Medication Instructions:  Your physician recommends that you continue on your current medications as directed. Please refer to the Current Medication list given to you today.  *If you need a refill on your cardiac medications before your next appointment, please call your pharmacy*   Lab Work: NONE ordered at this time of appointment   If you have labs (blood work) drawn today and your tests are completely normal, you will receive your results only by: [place] (if you have MyChart) OR A paper copy in the mail If you have any lab test that is abnormal or we need to change your treatment, we will call you to review the results.   Testing/Procedures: Your physician has requested that you have an echocardiogram. Echocardiography is a painless test that uses sound waves to create images of your heart. It provides your doctor with information about the size and shape of your heart and how well your heart's chambers and valves are working. This procedure takes approximately one hour. There are no restrictions for this procedure.  Please schedule for 2 weeks  Follow-Up: At [facility], you and your health needs are our priority.  As part of our continuing mission to provide you with exceptional heart care, we have created designated Provider Care Teams.  These Care Teams include your primary Cardiologist (physician) and Advanced Practice Providers (APPs -  Physician Assistants and Nurse Practitioners) who all work together to provide you with the care you need, when you need it.  Your next appointment:   6 month(s)  The format for your next appointment:   In Person  Provider:   Minus Breeding, MD  Other Instructions

## 2021-03-27 ENCOUNTER — Encounter: Payer: Self-pay | Admitting: Physician Assistant

## 2021-04-02 ENCOUNTER — Ambulatory Visit (HOSPITAL_BASED_OUTPATIENT_CLINIC_OR_DEPARTMENT_OTHER): Payer: Medicare Other | Attending: Cardiology

## 2021-04-02 ENCOUNTER — Other Ambulatory Visit: Payer: Self-pay

## 2021-04-02 DIAGNOSIS — I428 Other cardiomyopathies: Secondary | ICD-10-CM | POA: Diagnosis not present

## 2021-04-02 LAB — ECHOCARDIOGRAM COMPLETE
Area-P 1/2: 2.4 cm2
Calc EF: 52.4 %
S' Lateral: 2.57 cm
Single Plane A2C EF: 52.1 %
Single Plane A4C EF: 49 %

## 2021-04-09 ENCOUNTER — Inpatient Hospital Stay (HOSPITAL_COMMUNITY): Payer: Medicare Other

## 2021-04-09 ENCOUNTER — Encounter: Payer: Self-pay | Admitting: Gastroenterology

## 2021-04-09 ENCOUNTER — Inpatient Hospital Stay (HOSPITAL_COMMUNITY)
Admission: EM | Admit: 2021-04-09 | Discharge: 2021-04-14 | DRG: 504 | Disposition: A | Discharge status: Skilled Nursing Facility | Payer: Medicare Other | Source: Skilled Nursing Facility | Attending: Internal Medicine | Admitting: Internal Medicine

## 2021-04-09 ENCOUNTER — Other Ambulatory Visit: Payer: Self-pay

## 2021-04-09 ENCOUNTER — Emergency Department (HOSPITAL_COMMUNITY): Payer: Medicare Other

## 2021-04-09 ENCOUNTER — Telehealth: Payer: Self-pay | Admitting: Physician Assistant

## 2021-04-09 ENCOUNTER — Ambulatory Visit (INDEPENDENT_AMBULATORY_CARE_PROVIDER_SITE_OTHER): Payer: Medicare Other | Admitting: Gastroenterology

## 2021-04-09 VITALS — BP 106/60 | HR 84

## 2021-04-09 DIAGNOSIS — I130 Hypertensive heart and chronic kidney disease with heart failure and stage 1 through stage 4 chronic kidney disease, or unspecified chronic kidney disease: Secondary | ICD-10-CM | POA: Diagnosis present

## 2021-04-09 DIAGNOSIS — G909 Disorder of the autonomic nervous system, unspecified: Secondary | ICD-10-CM | POA: Diagnosis present

## 2021-04-09 DIAGNOSIS — R160 Hepatomegaly, not elsewhere classified: Secondary | ICD-10-CM

## 2021-04-09 DIAGNOSIS — M24552 Contracture, left hip: Secondary | ICD-10-CM | POA: Diagnosis present

## 2021-04-09 DIAGNOSIS — R059 Cough, unspecified: Secondary | ICD-10-CM | POA: Diagnosis present

## 2021-04-09 DIAGNOSIS — G8220 Paraplegia, unspecified: Secondary | ICD-10-CM | POA: Diagnosis present

## 2021-04-09 DIAGNOSIS — M86271 Subacute osteomyelitis, right ankle and foot: Secondary | ICD-10-CM | POA: Diagnosis not present

## 2021-04-09 DIAGNOSIS — I739 Peripheral vascular disease, unspecified: Secondary | ICD-10-CM | POA: Diagnosis not present

## 2021-04-09 DIAGNOSIS — I959 Hypotension, unspecified: Secondary | ICD-10-CM | POA: Diagnosis present

## 2021-04-09 DIAGNOSIS — K219 Gastro-esophageal reflux disease without esophagitis: Secondary | ICD-10-CM | POA: Diagnosis present

## 2021-04-09 DIAGNOSIS — F419 Anxiety disorder, unspecified: Secondary | ICD-10-CM | POA: Diagnosis present

## 2021-04-09 DIAGNOSIS — K7460 Unspecified cirrhosis of liver: Secondary | ICD-10-CM | POA: Diagnosis present

## 2021-04-09 DIAGNOSIS — M24551 Contracture, right hip: Secondary | ICD-10-CM | POA: Diagnosis present

## 2021-04-09 DIAGNOSIS — Z853 Personal history of malignant neoplasm of breast: Secondary | ICD-10-CM

## 2021-04-09 DIAGNOSIS — C220 Liver cell carcinoma: Secondary | ICD-10-CM | POA: Diagnosis present

## 2021-04-09 DIAGNOSIS — B1920 Unspecified viral hepatitis C without hepatic coma: Secondary | ICD-10-CM | POA: Diagnosis present

## 2021-04-09 DIAGNOSIS — Z8616 Personal history of COVID-19: Secondary | ICD-10-CM

## 2021-04-09 DIAGNOSIS — R053 Chronic cough: Secondary | ICD-10-CM | POA: Diagnosis present

## 2021-04-09 DIAGNOSIS — Z20822 Contact with and (suspected) exposure to covid-19: Secondary | ICD-10-CM | POA: Diagnosis present

## 2021-04-09 DIAGNOSIS — M869 Osteomyelitis, unspecified: Secondary | ICD-10-CM | POA: Diagnosis present

## 2021-04-09 DIAGNOSIS — M24561 Contracture, right knee: Secondary | ICD-10-CM | POA: Diagnosis present

## 2021-04-09 DIAGNOSIS — M069 Rheumatoid arthritis, unspecified: Secondary | ICD-10-CM | POA: Diagnosis present

## 2021-04-09 DIAGNOSIS — K592 Neurogenic bowel, not elsewhere classified: Secondary | ICD-10-CM | POA: Diagnosis present

## 2021-04-09 DIAGNOSIS — F32A Depression, unspecified: Secondary | ICD-10-CM | POA: Diagnosis present

## 2021-04-09 DIAGNOSIS — N189 Chronic kidney disease, unspecified: Secondary | ICD-10-CM | POA: Diagnosis present

## 2021-04-09 DIAGNOSIS — E785 Hyperlipidemia, unspecified: Secondary | ICD-10-CM | POA: Diagnosis present

## 2021-04-09 DIAGNOSIS — I2510 Atherosclerotic heart disease of native coronary artery without angina pectoris: Secondary | ICD-10-CM | POA: Diagnosis present

## 2021-04-09 DIAGNOSIS — Z87891 Personal history of nicotine dependence: Secondary | ICD-10-CM

## 2021-04-09 DIAGNOSIS — M24562 Contracture, left knee: Secondary | ICD-10-CM | POA: Diagnosis present

## 2021-04-09 DIAGNOSIS — L03115 Cellulitis of right lower limb: Secondary | ICD-10-CM | POA: Diagnosis present

## 2021-04-09 DIAGNOSIS — M86279 Subacute osteomyelitis, unspecified ankle and foot: Secondary | ICD-10-CM | POA: Diagnosis not present

## 2021-04-09 DIAGNOSIS — I10 Essential (primary) hypertension: Secondary | ICD-10-CM | POA: Diagnosis present

## 2021-04-09 DIAGNOSIS — M86171 Other acute osteomyelitis, right ankle and foot: Principal | ICD-10-CM | POA: Diagnosis present

## 2021-04-09 DIAGNOSIS — K5900 Constipation, unspecified: Secondary | ICD-10-CM

## 2021-04-09 DIAGNOSIS — I5022 Chronic systolic (congestive) heart failure: Secondary | ICD-10-CM | POA: Diagnosis present

## 2021-04-09 DIAGNOSIS — M810 Age-related osteoporosis without current pathological fracture: Secondary | ICD-10-CM | POA: Diagnosis present

## 2021-04-09 DIAGNOSIS — Z8249 Family history of ischemic heart disease and other diseases of the circulatory system: Secondary | ICD-10-CM

## 2021-04-09 DIAGNOSIS — G4700 Insomnia, unspecified: Secondary | ICD-10-CM | POA: Diagnosis present

## 2021-04-09 DIAGNOSIS — Z9013 Acquired absence of bilateral breasts and nipples: Secondary | ICD-10-CM

## 2021-04-09 DIAGNOSIS — L0390 Cellulitis, unspecified: Secondary | ICD-10-CM | POA: Diagnosis not present

## 2021-04-09 DIAGNOSIS — I96 Gangrene, not elsewhere classified: Secondary | ICD-10-CM | POA: Diagnosis present

## 2021-04-09 DIAGNOSIS — Z993 Dependence on wheelchair: Secondary | ICD-10-CM

## 2021-04-09 DIAGNOSIS — M359 Systemic involvement of connective tissue, unspecified: Secondary | ICD-10-CM | POA: Diagnosis present

## 2021-04-09 DIAGNOSIS — L97519 Non-pressure chronic ulcer of other part of right foot with unspecified severity: Secondary | ICD-10-CM | POA: Diagnosis present

## 2021-04-09 DIAGNOSIS — K3184 Gastroparesis: Secondary | ICD-10-CM | POA: Diagnosis present

## 2021-04-09 DIAGNOSIS — N319 Neuromuscular dysfunction of bladder, unspecified: Secondary | ICD-10-CM | POA: Diagnosis present

## 2021-04-09 DIAGNOSIS — Z79899 Other long term (current) drug therapy: Secondary | ICD-10-CM

## 2021-04-09 LAB — CBC WITH DIFFERENTIAL/PLATELET
Abs Immature Granulocytes: 0.08 10*3/uL — ABNORMAL HIGH (ref 0.00–0.07)
Basophils Absolute: 0.1 10*3/uL (ref 0.0–0.1)
Basophils Relative: 0 %
Eosinophils Absolute: 0.2 10*3/uL (ref 0.0–0.5)
Eosinophils Relative: 1 %
HCT: 20.4 % — ABNORMAL LOW (ref 36.0–46.0)
Hemoglobin: 6.5 g/dL — CL (ref 12.0–15.0)
Immature Granulocytes: 1 %
Lymphocytes Relative: 8 %
Lymphs Abs: 1.1 10*3/uL (ref 0.7–4.0)
MCH: 27.1 pg (ref 26.0–34.0)
MCHC: 31.9 g/dL (ref 30.0–36.0)
MCV: 85 fL (ref 80.0–100.0)
Monocytes Absolute: 1 10*3/uL (ref 0.1–1.0)
Monocytes Relative: 7 %
Neutro Abs: 12.2 10*3/uL — ABNORMAL HIGH (ref 1.7–7.7)
Neutrophils Relative %: 83 %
Platelets: 400 10*3/uL (ref 150–400)
RBC: 2.4 MIL/uL — ABNORMAL LOW (ref 3.87–5.11)
RDW: 15.5 % (ref 11.5–15.5)
WBC: 14.6 10*3/uL — ABNORMAL HIGH (ref 4.0–10.5)
nRBC: 0 % (ref 0.0–0.2)

## 2021-04-09 LAB — COMPREHENSIVE METABOLIC PANEL
ALT: 25 U/L (ref 0–44)
AST: 19 U/L (ref 15–41)
Albumin: 3.4 g/dL — ABNORMAL LOW (ref 3.5–5.0)
Alkaline Phosphatase: 76 U/L (ref 38–126)
Anion gap: 12 (ref 5–15)
BUN: 16 mg/dL (ref 8–23)
CO2: 23 mmol/L (ref 22–32)
Calcium: 10.2 mg/dL (ref 8.9–10.3)
Chloride: 101 mmol/L (ref 98–111)
Creatinine, Ser: 0.73 mg/dL (ref 0.44–1.00)
GFR, Estimated: >60 mL/min (ref >60)
Glucose, Bld: 122 mg/dL — ABNORMAL HIGH (ref 70–99)
Potassium: 3.6 mmol/L (ref 3.5–5.1)
Sodium: 136 mmol/L (ref 135–145)
Total Bilirubin: 0.4 mg/dL (ref 0.3–1.2)
Total Protein: 9 g/dL — ABNORMAL HIGH (ref 6.5–8.1)

## 2021-04-09 LAB — RESP PANEL BY RT-PCR (FLU A&B, COVID) ARPGX2
Influenza A by PCR: NEGATIVE
Influenza B by PCR: NEGATIVE
SARS Coronavirus 2 by RT PCR: NEGATIVE

## 2021-04-09 LAB — HEMOGLOBIN AND HEMATOCRIT, BLOOD
HCT: 41.5 % (ref 36.0–46.0)
Hemoglobin: 13.2 g/dL (ref 12.0–15.0)

## 2021-04-09 LAB — SEDIMENTATION RATE: Sed Rate: 104 mm/hr — ABNORMAL HIGH (ref 0–22)

## 2021-04-09 MED ORDER — ALBUTEROL SULFATE (2.5 MG/3ML) 0.083% IN NEBU: 2.5000 mg | INHALATION_SOLUTION | RESPIRATORY_TRACT | Status: DC | PRN

## 2021-04-09 MED ORDER — FAMOTIDINE 20 MG IN NS 100 ML IVPB
20.0000 mg | Freq: Once | INTRAVENOUS | Status: DC
  Filled 2021-04-09: qty 100

## 2021-04-09 MED ORDER — SODIUM CHLORIDE 0.9 % IV SOLN
2.0000 g | INTRAVENOUS | Status: DC
  Administered 2021-04-10 – 2021-04-13 (×4): 2 g via INTRAVENOUS
  Filled 2021-04-09 (×6): qty 20

## 2021-04-09 MED ORDER — LINACLOTIDE 145 MCG PO CAPS
290.0000 ug | ORAL_CAPSULE | Freq: Every day | ORAL | Status: DC
  Administered 2021-04-10 – 2021-04-14 (×5): 290 ug via ORAL
  Filled 2021-04-09 (×6): qty 2

## 2021-04-09 MED ORDER — CARVEDILOL 6.25 MG PO TABS
18.7500 mg | ORAL_TABLET | Freq: Two times a day (BID) | ORAL | Status: DC
  Administered 2021-04-10 – 2021-04-14 (×11): 18.75 mg via ORAL
  Filled 2021-04-09: qty 2
  Filled 2021-04-09 (×4): qty 1
  Filled 2021-04-09: qty 2
  Filled 2021-04-09 (×6): qty 1

## 2021-04-09 MED ORDER — VANCOMYCIN HCL 1500 MG/300ML IV SOLN
1500.0000 mg | INTRAVENOUS | Status: DC
  Administered 2021-04-10 – 2021-04-13 (×4): 1500 mg via INTRAVENOUS
  Filled 2021-04-09 (×6): qty 300

## 2021-04-09 MED ORDER — OXYBUTYNIN CHLORIDE ER 5 MG PO TB24
5.0000 mg | ORAL_TABLET | Freq: Every day | ORAL | Status: DC
  Administered 2021-04-09 – 2021-04-13 (×5): 5 mg via ORAL
  Filled 2021-04-09 (×7): qty 1

## 2021-04-09 MED ORDER — BISACODYL 10 MG RE SUPP
10.0000 mg | Freq: Every day | RECTAL | Status: DC
  Administered 2021-04-09 – 2021-04-11 (×3): 10 mg via RECTAL
  Filled 2021-04-09 (×4): qty 1

## 2021-04-09 MED ORDER — LOSARTAN POTASSIUM 25 MG PO TABS
25.0000 mg | ORAL_TABLET | Freq: Every day | ORAL | Status: DC
  Administered 2021-04-10: 25 mg via ORAL
  Filled 2021-04-09 (×2): qty 1

## 2021-04-09 MED ORDER — SENNOSIDES-DOCUSATE SODIUM 8.6-50 MG PO TABS
2.0000 | ORAL_TABLET | Freq: Two times a day (BID) | ORAL | Status: DC
  Administered 2021-04-09 – 2021-04-14 (×10): 2 via ORAL
  Filled 2021-04-09 (×10): qty 2

## 2021-04-09 MED ORDER — SERTRALINE HCL 50 MG PO TABS
50.0000 mg | ORAL_TABLET | Freq: Every day | ORAL | Status: DC
  Administered 2021-04-10 – 2021-04-14 (×5): 50 mg via ORAL
  Filled 2021-04-09 (×6): qty 1

## 2021-04-09 MED ORDER — SPIRONOLACTONE 25 MG PO TABS
100.0000 mg | ORAL_TABLET | Freq: Every day | ORAL | Status: DC
  Administered 2021-04-10: 100 mg via ORAL
  Filled 2021-04-09: qty 1
  Filled 2021-04-09: qty 4

## 2021-04-09 MED ORDER — PANTOPRAZOLE SODIUM 40 MG PO TBEC
40.0000 mg | DELAYED_RELEASE_TABLET | Freq: Two times a day (BID) | ORAL | Status: DC
  Administered 2021-04-09 – 2021-04-14 (×9): 40 mg via ORAL
  Filled 2021-04-09 (×10): qty 1

## 2021-04-09 MED ORDER — FUROSEMIDE 40 MG PO TABS
40.0000 mg | ORAL_TABLET | Freq: Two times a day (BID) | ORAL | Status: DC
  Administered 2021-04-09 – 2021-04-10 (×3): 40 mg via ORAL
  Filled 2021-04-09: qty 2
  Filled 2021-04-09: qty 1
  Filled 2021-04-09: qty 2

## 2021-04-09 MED ORDER — POLYETHYLENE GLYCOL 3350 17 G PO PACK
17.0000 g | PACK | Freq: Every day | ORAL | Status: DC
  Administered 2021-04-09 – 2021-04-14 (×6): 17 g via ORAL
  Filled 2021-04-09 (×6): qty 1

## 2021-04-09 MED ORDER — HEPARIN SODIUM (PORCINE) 5000 UNIT/ML IJ SOLN
5000.0000 [IU] | Freq: Three times a day (TID) | INTRAMUSCULAR | Status: DC
  Administered 2021-04-09 – 2021-04-14 (×14): 5000 [IU] via SUBCUTANEOUS
  Filled 2021-04-09 (×15): qty 1

## 2021-04-09 MED ORDER — OMEPRAZOLE 40 MG PO CPDR: DELAYED_RELEASE_CAPSULE | ORAL | 6 refills | Status: DC

## 2021-04-09 MED ORDER — DIPHENHYDRAMINE HCL 50 MG/ML IJ SOLN
25.0000 mg | Freq: Once | INTRAMUSCULAR | Status: AC
  Administered 2021-04-09: 25 mg via INTRAVENOUS
  Filled 2021-04-09: qty 1

## 2021-04-09 MED ORDER — VANCOMYCIN HCL 1750 MG/350ML IV SOLN
1750.0000 mg | Freq: Once | INTRAVENOUS | Status: AC
  Administered 2021-04-09: 1750 mg via INTRAVENOUS
  Filled 2021-04-09: qty 350
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
## 2021-04-09 NOTE — Patient Instructions (Signed)
If you are age 70 or older, your body mass index should be between 23-30. Your There is no height or weight on file to calculate BMI. If this is out of the aforementioned range listed, please consider follow up with your Primary Care Provider.  If you are age 47 or younger, your body mass index should be between 19-25. Your There is no height or weight on file to calculate BMI. If this is out of the aformentioned range listed, please consider follow up with your Primary Care Provider.   __________________________________________________________  The [facility] providers would like to encourage you to use [facility] to communicate with providers for non-urgent requests or questions.  Due to long hold times on the telephone, sending your provider a message by [facility] may be a faster and more efficient way to get a response.  Please allow 48 business hours for a response.  Please remember that this is for non-urgent requests.   We have sent the following medications to your pharmacy for you to pick up at your convenience:  Omeprazole 15m - twice a day for a month then lowest dose needed to control symptoms.  Increase Miralax to twice a day.  You will be due for a Cologuard in 08/2021

## 2021-04-09 NOTE — ED Notes (Signed)
RN and NT cleaned pt. New chuck and diaper applied. Old diaper was soiled with urine. Pt has a chronic foley and has breakdown in vagina area and rectum. Sores look like pressure ulcers stage 2-3.

## 2021-04-09 NOTE — Telephone Encounter (Signed)
As per Isaac Laud Meng's note on 03/25/21:  I recommended a echocardiogram, if EF is stable or improved, she is cleared to proceed with the procedure.   I have reviewed the echo and it shows improved EF and no significant valvular disease. Cleared for surgery.

## 2021-04-09 NOTE — ED Triage Notes (Signed)
Pt BIB ems due to a toe injury. Pt lives at [place]. 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 Pt was showering and noticed her toes turning black and called ems. Pt is vss and axox4. Pt toes look necrotic on arrival.

## 2021-04-09 NOTE — Telephone Encounter (Signed)
Left message on [name]'s voicemail informing her of note of patient being cleared for procedure.
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
## 2021-04-09 NOTE — Telephone Encounter (Signed)
Agree with Angie, I have reviewed the echocardiogram, she is cleared from the cardiac perspective to proceed with procedure.

## 2021-04-09 NOTE — Progress Notes (Signed)
Pharmacy Antibiotic Note  [name] is a 70 y.o. female admitted on 04/09/2021 with  osteomyelitis .  Pharmacy has been consulted for vancomycin dosing.  WBC 14.6, afebrile SCr 0.73 - baseline  Plan: Give vancomycin 1750 mg mg IV x1 Vancomycin 1500 mg IV every 24 hours.  Goal trough 15-20 mcg/mL.    > eAUC 494.8, SCr used 0.8, Cmax 36.9 Monitor renal function, CBC, cultures/sensitivities, LOT Follow-up vascular intervention plans/recommendations  Temp (24hrs), Avg:99.4 F (37.4 C), Min:99.4 F (37.4 C), Max:99.4 F (37.4 C)  Recent Labs  Lab 04/09/21 1400  WBC 14.6*  CREATININE 0.73    CrCl cannot be calculated (Unknown ideal weight.).    No Known Allergies  Antimicrobials this admission: Vancomycin 9/14 >>  Dose adjustments this admission: none  Microbiology results: none  Thank you for allowing pharmacy to be a part of this patient's care.  Laurey Arrow, PharmD PGY1 Pharmacy Resident 04/09/2021  4:59 PM  Please check [email or web address] for unit-specific pharmacy phone numbers.

## 2021-04-09 NOTE — Telephone Encounter (Signed)
Follow Up:     Vickie called and wanted to be sure patient Echo is reviewed and let them know if they can proceed with surgery. She said you can put this in Epic please.
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
## 2021-04-10 ENCOUNTER — Inpatient Hospital Stay (HOSPITAL_COMMUNITY): Payer: Medicare Other

## 2021-04-10 DIAGNOSIS — M869 Osteomyelitis, unspecified: Secondary | ICD-10-CM

## 2021-04-10 DIAGNOSIS — I739 Peripheral vascular disease, unspecified: Secondary | ICD-10-CM | POA: Diagnosis not present

## 2021-04-10 DIAGNOSIS — M86171 Other acute osteomyelitis, right ankle and foot: Secondary | ICD-10-CM | POA: Diagnosis not present

## 2021-04-10 DIAGNOSIS — I96 Gangrene, not elsewhere classified: Secondary | ICD-10-CM

## 2021-04-10 DIAGNOSIS — G8220 Paraplegia, unspecified: Secondary | ICD-10-CM

## 2021-04-10 DIAGNOSIS — Z87891 Personal history of nicotine dependence: Secondary | ICD-10-CM

## 2021-04-10 DIAGNOSIS — L0390 Cellulitis, unspecified: Secondary | ICD-10-CM

## 2021-04-10 LAB — BASIC METABOLIC PANEL
Anion gap: 11 (ref 5–15)
BUN: 13 mg/dL (ref 8–23)
CO2: 21 mmol/L — ABNORMAL LOW (ref 22–32)
Calcium: 9.3 mg/dL (ref 8.9–10.3)
Chloride: 103 mmol/L (ref 98–111)
Creatinine, Ser: 0.72 mg/dL (ref 0.44–1.00)
GFR, Estimated: >60 mL/min (ref >60)
Glucose, Bld: 120 mg/dL — ABNORMAL HIGH (ref 70–99)
Potassium: 3.4 mmol/L — ABNORMAL LOW (ref 3.5–5.1)
Sodium: 135 mmol/L (ref 135–145)

## 2021-04-10 LAB — C-REACTIVE PROTEIN: CRP: 19.7 mg/dL — ABNORMAL HIGH (ref <1.0)

## 2021-04-10 LAB — PROTIME-INR
INR: 1.2 (ref 0.8–1.2)
Prothrombin Time: 15.1 seconds (ref 11.4–15.2)

## 2021-04-10 LAB — LIPID PANEL
Cholesterol: 208 mg/dL — ABNORMAL HIGH (ref 0–200)
HDL: 28 mg/dL — ABNORMAL LOW (ref >40)
LDL Cholesterol: 147 mg/dL — ABNORMAL HIGH (ref 0–99)
Total CHOL/HDL Ratio: 7.4 RATIO
Triglycerides: 164 mg/dL — ABNORMAL HIGH (ref <150)
VLDL: 33 mg/dL (ref 0–40)

## 2021-04-10 LAB — CBC
HCT: 35.3 % — ABNORMAL LOW (ref 36.0–46.0)
Hemoglobin: 11.3 g/dL — ABNORMAL LOW (ref 12.0–15.0)
MCH: 26.3 pg (ref 26.0–34.0)
MCHC: 32 g/dL (ref 30.0–36.0)
MCV: 82.3 fL (ref 80.0–100.0)
Platelets: 270 10*3/uL (ref 150–400)
RBC: 4.29 MIL/uL (ref 3.87–5.11)
RDW: 15.5 % (ref 11.5–15.5)
WBC: 8.6 10*3/uL (ref 4.0–10.5)
nRBC: 0 % (ref 0.0–0.2)

## 2021-04-10 LAB — HEMOGLOBIN A1C
Hgb A1c MFr Bld: 5.7 % — ABNORMAL HIGH (ref 4.8–5.6)
Mean Plasma Glucose: 116.89 mg/dL

## 2021-04-10 LAB — APTT: aPTT: 33 seconds (ref 24–36)

## 2021-04-10 MED ORDER — ASPIRIN EC 81 MG PO TBEC
81.0000 mg | DELAYED_RELEASE_TABLET | Freq: Every day | ORAL | Status: DC
  Administered 2021-04-11 – 2021-04-14 (×4): 81 mg via ORAL
  Filled 2021-04-10 (×5): qty 1

## 2021-04-10 MED ORDER — DEXTROMETHORPHAN POLISTIREX ER 30 MG/5ML PO SUER
15.0000 mg | Freq: Two times a day (BID) | ORAL | Status: AC
  Administered 2021-04-10 – 2021-04-12 (×4): 15 mg via ORAL
  Filled 2021-04-10 (×4): qty 5

## 2021-04-10 MED ORDER — ALUM & MAG HYDROXIDE-SIMETH 200-200-20 MG/5ML PO SUSP
15.0000 mL | Freq: Every day | ORAL | Status: DC
  Administered 2021-04-10 – 2021-04-14 (×4): 15 mL via ORAL
  Filled 2021-04-10 (×5): qty 30

## 2021-04-10 MED ORDER — ATORVASTATIN CALCIUM 40 MG PO TABS: 40.0000 mg | ORAL_TABLET | Freq: Every day | ORAL | Status: DC

## 2021-04-10 MED ORDER — CHLORHEXIDINE GLUCONATE CLOTH 2 % EX PADS
6.0000 | MEDICATED_PAD | Freq: Every day | CUTANEOUS | Status: DC
  Administered 2021-04-10 – 2021-04-14 (×5): 6 via TOPICAL
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
## 2021-04-10 NOTE — Progress Notes (Addendum)
Subjective: Interviewed at bedside. Patient is reporting heartburn symptoms this morning, no other complaints. We discussed what the orthopedic surgeon had recommended for planning after ABIs. She expressed concern over losing the toes and would benefit from additional clarification once ABIs return. She is also concerned about the status of her foley catheter. She is worried because they had to increase the size and it is still leaking, wondering if it can be removed.   Objective:  Vital signs in last 24 hours: Vitals:   04/10/21 0050 04/10/21 0200 04/10/21 0400 04/10/21 0605  BP:  (!) 95/53 (!) 104/59 (!) 96/53  Pulse:  96 96 92  Resp:  _0 Temp: 97.7 F (36.5 C)     TempSrc: Oral     SpO2:  94% 90% 93%  Weight:      Height:       Physical Exam Vitals and nursing note reviewed. Exam conducted with a chaperone present.  Cardiovascular:     Rate and Rhythm: Normal rate and regular rhythm.  Pulmonary:     Effort: Pulmonary effort is normal.     Breath sounds: Normal breath sounds.  Genitourinary:    Comments: Multiple sores on right side in perineal area Musculoskeletal:     Cervical back: Neck supple.  Feet:     Comments: Necrotic ulcerations of 4th and 5th digits of right foot; necrotic ulcer on digit 2 of left foot Lymphadenopathy:     Cervical: No cervical adenopathy.  Neurological:     Mental Status: She is alert.    VAS [place] ABI WITH/WO TBI 04/10/2021 Summary: Right: Resting right ankle-brachial index indicates moderate right lower extremity arterial disease. The right toe-brachial index is abnormal. Left: Resting left ankle-brachial index indicates moderate left lower extremity arterial disease. The left toe-brachial index is abnormal.  Xray foot complete right 04/09/2021 Personally reviewed, my impression is: erosion of middle and distal phalanges in digit 5 of right foot, and proximal phalanx of digit 4 head, generalized soft tissue swelling. No foreign  bodies present, no signs of emphysema concerning for a gaseous gangrene process  Xray chest 04/09/2021 Personally reviewed, my impression is no acute infectious or inflammatory process is seen on imaging.     Assessment/Plan:  Principal Problem:   Osteomyelitis ([place]) Active Problems:   Rheumatoid arthritis ([place])   Essential hypertension   Neurogenic bladder   Paraplegia (HCC)   Dry gangrene (HCC)  Acute Osteomyelitis right 4th and 5th toes with dry gangrene Cellulitis of the right foot Peripheral Artery Disease She has elevated inflammatory markers, CRP 17 and ESR 100. Xray of the foot shows changes consistent with osteomyelitis. ABI's abnormal. VVS has been consulted and will see patient. Bilateral lower extremity contractures will make surgery planning complicated. Note that lipids were high and we discussed starting a statin with Ms Pribble and she asked for time to consider this. - continue ceftriaxone and vanc for associated cellulitis - amputation plan per surgery teams - start high intensity statin prior to discharge - aspirin after surgery   Cough GERD Suspect relation to worsening GERD. Respiratory panel was negative, no leukocytosis, no fever or signs of infection at this time. CXR still pending for further treatment guidance. At this time, assessing response to increase PPI dosing. 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 There is tissue breakdown in the perineum. She notes it has been leaking recently, but there is good urine output into the foley bag. Appreciate recs from wound care. We will continue her suppository regimen here. - maalox 15ml daily - dulcolax suppository 10mg daily - linaclotide 290 mcg  daily - miralax daily - senna-docusate 2 tablets BID - oxybutinin 5mg daily  HCV related Cirrhosis Hepatocellular carcinoma Patient's cirrhosis is well compensated at this time. Planned for microwave ablation in October for the HCC. Will continue to monitor. Platelets 270k, INR 1.2.  Chronic heart failure with mildly reduced EF Most recent echo 04/02/21 reveals EF of 45-50%. Well compensated, no volume overload at this time. Will continue home medicines. - spironolactone 100mg PO daily - lasix 40mg po BID - losartan 25mg po daily - carvedilol 18.75mg po BID  DVT prophylaxis with heparin 5000 TID [place]   LOS: 1 day   ,  L, Medical Student 04/10/2021, 7:08 AM  

## 2021-04-10 NOTE — ED Notes (Signed)
Patient transported to vascular.

## 2021-04-10 NOTE — Progress Notes (Signed)
VASCULAR LAB    ABIs have been performed.  See CV proc for preliminary results.   Breanna Shorkey, RVT 04/10/2021, 10:06 AM

## 2021-04-10 NOTE — Progress Notes (Signed)
Patient arrived to 4E15 from ED. Ulcers present on 4th and 5th toe of the right foot. Bilateral DP dopplered successfully. Pressure injuries present on her bottom. Foam placed, WOC consulted and specialty mattress ordered. VSS. Patient denies pain. Patient placed on telemetry and CCMD notified. Bed low and locked, and call bell left within reach.

## 2021-04-10 NOTE — ED Notes (Signed)
Provider at bedside

## 2021-04-11 ENCOUNTER — Encounter (HOSPITAL_COMMUNITY)
Admission: EM | Disposition: A | Discharge status: Skilled Nursing Facility | Payer: Self-pay | Source: Skilled Nursing Facility | Attending: Internal Medicine

## 2021-04-11 ENCOUNTER — Ambulatory Visit (HOSPITAL_COMMUNITY): Admit: 2021-04-11 | Payer: Medicare Other | Admitting: Vascular Surgery

## 2021-04-11 ENCOUNTER — Encounter (HOSPITAL_COMMUNITY): Payer: Self-pay

## 2021-04-11 DIAGNOSIS — M86171 Other acute osteomyelitis, right ankle and foot: Secondary | ICD-10-CM | POA: Diagnosis not present

## 2021-04-11 SURGERY — ABDOMINAL AORTOGRAM W/LOWER EXTREMITY
Anesthesia: LOCAL

## 2021-04-11 SURGERY — AMPUTATION, ABOVE KNEE
Anesthesia: General | Site: Knee | Laterality: Right

## 2021-04-11 MED ORDER — SUCRALFATE 1 GM/10ML PO SUSP: 1.0000 g | Freq: Four times a day (QID) | ORAL | Status: DC | PRN

## 2021-04-11 MED ORDER — ZINC OXIDE 12.8 % EX OINT
1.0000 "application " | TOPICAL_OINTMENT | CUTANEOUS | Status: DC | PRN
  Administered 2021-04-11 – 2021-04-13 (×4): 1 via TOPICAL
  Filled 2021-04-11: qty 56.7

## 2021-04-11 MED ORDER — LACTATED RINGERS IV BOLUS
1000.0000 mL | Freq: Once | INTRAVENOUS | Status: AC
  Administered 2021-04-11: 1000 mL via INTRAVENOUS

## 2021-04-11 MED ORDER — HYDROXYZINE HCL 25 MG PO TABS: 50.0000 mg | ORAL_TABLET | Freq: Three times a day (TID) | ORAL | Status: DC | PRN

## 2021-04-11 NOTE — Progress Notes (Addendum)
   Subjective: Patient interviewed at bedside. She notes her cough had some improvement with the delsym. Still reporting extensive belching and gas but not currently having heartburn symptoms. We discussed the plans for surgery and that an AKA was planned for today. The patient was unaware that the amputation surgery would be so extensive and this created much distress for her. She needed time to consider the news and wants to discuss with family and the surgical team. Will continue to monitor.  Objective: Vital signs in last 24 hours: Vitals:   04/11/21 0530 04/11/21 0600 04/11/21 0630 04/11/21 0856  BP: 93/66 108/64 (!) 93/59 105/67  Pulse:      Resp:    20  Temp:    98.2 F (36.8 C)  TempSrc:    Oral  SpO2:    100%  Weight:      Height:       Physical Exam Constitutional:      General: She is not in acute distress. Cardiovascular:     Rate and Rhythm: Normal rate and regular rhythm.  Abdominal:     Palpations: Abdomen is soft.  Skin:    General: Skin is warm and dry.  Neurological:     Mental Status: She is alert.     Assessment/Plan:  Principal Problem:   Osteomyelitis ([place]) Active Problems:   Rheumatoid arthritis ([place])   Essential hypertension   Neurogenic bladder   Paraplegia (HCC)   Dry gangrene (HCC)  Osteomyelitis Digit 4 and 5 of Right Foot Cellulitis of the right foot Peripheral artery disease The combination of immobility, contractures, and PAD make her an unsuitable candidate for revascularization, per VVS. Prior to rounds today, patient was not informed of today's scheduled R AKA. Patient was understandably distraught and needed time to think it over and talk to family. Per chart review, patient has decided to hold off amputation at this time. Will follow-up with patient and orthopedics this afternoon for any updates. Will also need to optimize medical management, especially if patient does not undergo surgery. She has previously been cautious about starting  a statin, but will need to have continuous discussions regarding this. If patient opts for medical management only, will consult infectious disease for recommendations for long-term antibiotic regimen. We have not revisited starting a statin, will discuss at a later date.  - continue ceftriaxone and vancomycin - ASA 7m daily - surgery pending - hydroxyzine for acute anxiety  Chronic cough GERD She reports improvement with Delsym last night. She was requesting omeprazole instead of pantoprazole, but this is not possible as not on our formulary. Her labs remain stable, no leukocytosis. Unclear if improvement is due to delsym or PPI dose increase but will  continue to treat both at this time. - continue delsym 159mpo BID - pantoprazole 4076mo BID - sucralfate   Neurogenic bowel Neurogenic bladder with chronic urethral catheter use Spinal cord injury with paraplegia and lower extremity contractures Wound care has looked and given recommendations. Continue her supp[facility] regimen. [facility] - maalox 27m19mily - dulcolax suppository 10mg16mly - linaclotide 290 mcg daily - miralax daily - senna-docusate 2 tablets BID - oxybutinin 5mg d9my   LOS: 2 days   Pravin Perezperez, Rebecc[employer] Gillscal Student 04/11/2021, 10:40 AM

## 2021-04-11 NOTE — Progress Notes (Signed)
Paged by nurse due to concern for low pressures with systolics in the 39P despite recycling. Went to bedside to assess patient. On exam she was in NAD, alert and answering questions appropriately. Her mucous membranes were dry, she had no pitting edema, and no JVD. She was saturating well on room air with normal work of breathing. She looked to be volume down and complained of feeling thirsty. Ordered a fluid bolus of 1L LR and will hold morning lasix, losartan, and spironolactone.

## 2021-04-11 NOTE — Consult Note (Addendum)
[place] Nurse Consult Note: Patient receiving care in [place] Paraplegic for years according to the patient. Reason for Consult: Chronic foley leaking, new sores in genital area Wound type: Upon assessment this RN was only able to see pink scar tissue that was on the labia. The foley this patient has is too large and has no stabilization device. This foley size is not recommended and should be changed back to a smaller french with the appropriate CC of NS in the balloon and a stabilization device.  Pressure Injury POA: NA Dressing procedure/placement/frequency: Clean the area with soap and water, rinse and then clean with foley wipes. May apply triple paste for any moisture associated damage that occurs.   Monitor the wound area(s) for worsening of condition such as: Signs/symptoms of infection, increase in size, development of or worsening of odor, development of pain, or increased pain at the affected locations.   Notify the medical team if any of these develop.  Pressure Injury Prevention Bundle May use any that apply to this patient. Support surfaces (air mattress) chair cushion [name] # [id number]) Heel offloading boots [name] # [phone]) Turning and Positioning  Measures to reduce shear (draw sheet, knees up) Skin protection Products (Foam dressing) Moisture management products (Critic-Aid Barrier Cream (Purple top) Sween moisturizing lotion (Pink top in clean supply) Nutrition Management Protection for Medical Devices Routine Skin Assessment   Thank you for the consult. [place] nurse will not follow at this time.   Please re-consult the [place] team if needed.  [name], MSN, RN, [name], [name], [facility] Wound Treatment Associate Pager [phone]

## 2021-04-11 NOTE — Progress Notes (Addendum)
PROGRESS NOTE:  This morning during rounds, we disclosed to [name] the plan for R above the knee amputation later today. She had not been told this before from any other provider nor was IMTS notified of this plan. She was understandably upset at the time and requested time to process this information.  This afternoon, patient evaluated by myself and Student Doctor Kager with patient's sister at bedside. [name] stated that she continues to be in a state of shock given the news of above the knee amputation this morning. At this point, we discussed her case in full in order to improve understanding between patient, family, and providers. Patient's brothers, Al and Thera Flake, joined the conversation as well. We discussed findings of peripheral arterial disease that likely led to the dry gangrene in her toes. Also discussed that given poor blood flow to her feet the likelihood of good wound healing is low. Family discussed that they would like to explore the option of toe amputation with long-term IV antibiotics. [name] expressed that she does not believe she is ready for the above the knee amputation at this time. I explained that the best way to reduce the risk of further infections and hospitalizations would be a more aggressive approach given the extent of her disease. However, believe it would be understandable to discuss with vascular and orthopedics other options.   Apologized to both family and patient for the miscommunication and misunderstanding thus far in her hospitalization. Unfortunately, patient has still not been able to talk to orthopedics about this matter. Patient and family are understandably frustrated that this news was given to them on day of the scheduled operation without any prior notice. Patient also expressed frustration that she has never met the surgeon scheduled to perform her surgery. She states she attempted to have vascular surgery discuss with her brother earlier  today, but was told that they did not have time for this.  At this time, will plan to consult infectious disease regarding long-term antibiotics. Will also need input from orthopedics/vascular whether toe amputation would be feasible given patient's wishes. Would best serve patient to have clear communication from all providers regarding the plan moving forward.  ADDENDUM 19:30 Paged by RN patient had further questions regarding her care. I went to the bedside to further discuss. [name] was surrounded by her sister and friend. She expressed that if she were to have surgery, she would like for Dr. Sharol Given with orthopedics to perform it. She understandably re-iterated frustration regarding who was scheduled to perform the amputation, as she was originally under the impression Dr. Sharol Given would perform this although Dr. Donzetta Matters was scheduled per chart review. She also mentioned that she would like for a good plan to be in place for wound care if she were to have surgery. Mentioned that she does not want to be rushed from the hospital and would like to have follow-up at the wound care clinic. Re-assured [name] that we will work with her to have a safe follow-up plan.  Sanjuan Dame, MD Internal Medicine PGY-2 [phone]

## 2021-04-11 NOTE — Progress Notes (Addendum)
   04/11/21 2333  Vitals  BP (!) 91/51  MAP (mmHg) (!) 64  BP Location Right Wrist  BP Method Automatic  Patient Position (if appropriate) Lying  Pulse Rate 72  Pulse Rate Source Monitor  ECG Heart Rate 75  Resp 20  MEWS COLOR  MEWS Score Color Green  Oxygen Therapy  SpO2 92 %  O2 Device Room Air  MEWS Score  MEWS Temp 0  MEWS Systolic 1  MEWS Pulse 0  MEWS RR 0  MEWS LOC 0  MEWS Score 1   No sign of distress noted, patient denies SOB, dizziness or chest pain.  Jimwala MD paged.

## 2021-04-11 NOTE — Progress Notes (Signed)
Case discussed in detail with Dr. Sharol Given yesterday afternoon. In her nonambulatory status and chronically contracted lower extremities, I feel the best option for her is an above-knee amputation. I think toe amputations are an option, but are not likely to heal. Palliative care is also an option with local wound care to the toes with Betadine paint daily. We have time to do the above-knee amputation this afternoon, but the patient is not willing to accept this at the current moment. She would like some time to discuss this with family.  I think this is totally reasonable. Please call if the patient has made a decision regarding operative intervention.  We will not actively follow over the weekend.  I will check back in with her on Monday. Okay for regular diet from my standpoint.  Yevonne Aline. Stanford Breed, MD [facility] Phone Number: [phone] 04/11/2021 10:30 AM

## 2021-04-12 DIAGNOSIS — M86171 Other acute osteomyelitis, right ankle and foot: Secondary | ICD-10-CM | POA: Diagnosis not present

## 2021-04-12 LAB — CBC
HCT: 33.3 % — ABNORMAL LOW (ref 36.0–46.0)
Hemoglobin: 10.3 g/dL — ABNORMAL LOW (ref 12.0–15.0)
MCH: 25.7 pg — ABNORMAL LOW (ref 26.0–34.0)
MCHC: 30.9 g/dL (ref 30.0–36.0)
MCV: 83 fL (ref 80.0–100.0)
Platelets: 257 10*3/uL (ref 150–400)
RBC: 4.01 MIL/uL (ref 3.87–5.11)
RDW: 15.3 % (ref 11.5–15.5)
WBC: 6.4 10*3/uL (ref 4.0–10.5)
nRBC: 0 % (ref 0.0–0.2)

## 2021-04-12 LAB — BASIC METABOLIC PANEL
Anion gap: 11 (ref 5–15)
BUN: 12 mg/dL (ref 8–23)
CO2: 22 mmol/L (ref 22–32)
Calcium: 9.3 mg/dL (ref 8.9–10.3)
Chloride: 104 mmol/L (ref 98–111)
Creatinine, Ser: 0.61 mg/dL (ref 0.44–1.00)
GFR, Estimated: >60 mL/min (ref >60)
Glucose, Bld: 99 mg/dL (ref 70–99)
Potassium: 3.4 mmol/L — ABNORMAL LOW (ref 3.5–5.1)
Sodium: 137 mmol/L (ref 135–145)

## 2021-04-12 MED ORDER — CHLORHEXIDINE GLUCONATE 4 % EX LIQD: 60.0000 mL | Freq: Once | CUTANEOUS | Status: DC

## 2021-04-12 MED ORDER — DEXTROMETHORPHAN POLISTIREX ER 30 MG/5ML PO SUER
15.0000 mg | Freq: Two times a day (BID) | ORAL | Status: AC
  Administered 2021-04-13 (×2): 15 mg via ORAL
  Filled 2021-04-12 (×2): qty 5

## 2021-04-12 MED ORDER — LACTATED RINGERS IV BOLUS
1000.0000 mL | Freq: Once | INTRAVENOUS | Status: AC
  Administered 2021-04-12: 1000 mL via INTRAVENOUS

## 2021-04-12 MED ORDER — CEFAZOLIN SODIUM-DEXTROSE 2-4 GM/100ML-% IV SOLN
2.0000 g | INTRAVENOUS | Status: DC
  Filled 2021-04-12 (×2): qty 100

## 2021-04-12 MED ORDER — BENZONATATE 100 MG PO CAPS: 100.0000 mg | ORAL_CAPSULE | Freq: Three times a day (TID) | ORAL | Status: DC

## 2021-04-12 MED ORDER — MELATONIN 3 MG PO TABS
3.0000 mg | ORAL_TABLET | Freq: Every day | ORAL | Status: DC
  Administered 2021-04-12 – 2021-04-13 (×2): 3 mg via ORAL
  Filled 2021-04-12 (×2): qty 1

## 2021-04-12 MED ORDER — TIZANIDINE HCL 4 MG PO TABS
4.0000 mg | ORAL_TABLET | Freq: Three times a day (TID) | ORAL | Status: DC
  Administered 2021-04-12 – 2021-04-14 (×8): 4 mg via ORAL
  Filled 2021-04-12 (×8): qty 1

## 2021-04-12 MED ORDER — BISACODYL 10 MG RE SUPP
10.0000 mg | RECTAL | Status: DC
  Administered 2021-04-12 – 2021-04-13 (×2): 10 mg via RECTAL
  Filled 2021-04-12 (×2): qty 1

## 2021-04-12 MED ORDER — BACLOFEN 5 MG HALF TABLET
15.0000 mg | ORAL_TABLET | Freq: Two times a day (BID) | ORAL | Status: DC
  Administered 2021-04-12 – 2021-04-14 (×5): 15 mg via ORAL
  Filled 2021-04-12 (×6): qty 1

## 2021-04-12 MED ORDER — POVIDONE-IODINE 10 % EX SWAB: 2.0000 "application " | Freq: Once | CUTANEOUS | Status: DC

## 2021-04-12 NOTE — Progress Notes (Addendum)
   Subjective:  HD 3  Patient evaluated at bedside this AM. She states she is feeling better about her situation today after talking to her family. She did not get much sleep last night due to some anxiety. Discussed plan to reach out to orthopedics for further recommendations.   Objective:  Vital signs in last 24 hours: Vitals:   04/11/21 2333 04/12/21 0307 04/12/21 0801 04/12/21 1225  BP: (!) 91/51 93/69 (!) 102/56 (!) 87/58  Pulse: 72  87 77  Resp: 20 18 18 20   Temp:  98.4 F (36.9 C) 98.5 F (36.9 C) 98.7 F (37.1 C)  TempSrc:  Oral Oral Oral  SpO2: 92% 98% 93% 97%  Weight:      Height:       General: Resting in bed, no acute distress CV: Regular rate, rhythm.  Pulm: Normal work of breathing on room air. Clear to ausculation bilaterally. MSK: Necrotic changes in R 4th and 5th digits. Neuro: Awake, alert, conversing appropriately.  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 Sharol Given.  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 At this time, it appears Ms. Odonnel would like to defer the more aggressive approach and instead proceed with toe amputation if available. I contacted Dr. Sharol Given this morning, who visited with Ms. Bondarenko today. Current plan for 4th and 5th toe amputation tomorrow. Appreciate Dr. Jess Barters flexibility and recommendations. Will continue with vancomycin and ceftriaxone. Can consider further IV antibiotics,  although questionable if this would benefit Ms. Giroux given her peripheral arterial disease. Will discuss with infectious disease and orthopedics. We will also need to further discuss initiating statin therapy w/ LDL 149. - Continue Rocephin, vancomycin - Aspirin 33m daily - Amputation tomorrow, NPO @ MN  #Hypotension Patient has been intermittently hypotensive throughout admission. This AM, patient's BP 80/48 after receiving this AM's antihypertensives. Patient does have history of autonomic dysfunction 2/2 spinal cord injury. However, given patient's acute infection will give 1L bolus and continue to monitor. - Lactated ringer bolus  #Chronic cough #GERD Since arrival, patient has been taking Delsym along with twice daily PPI. It appears this regimen has helped patient's cough. Will continue with this regimen. - Delsym 161mtwice daily - Pantoprazole 4063mwice daily - Sucralfate 1g q6h PRN  #Insomnia Patient was unable to sleep last night, as she was restless. Outside of the hospital she takes melatonin as needed. In addition, patient has been anxious regarding her current condition. Will continue with hydroxyzine as needed. - Melatonin 3mg69mS PRN - Hydroxyzine 50mg48mee times daily as needed for anxiety  Prior to Admission Living Arrangement: LTCF Anticipated Discharge Location: LTCF Barriers to Discharge: medical, surgical management Dispo: Anticipated discharge in approximately 3-4 day(s).   BraswSanjuan Dame9/17/2022, 12:39 PM Pager: [phone] 5pm on weekdays and 1pm on weekends: On Call pager [phone]

## 2021-04-12 NOTE — Progress Notes (Signed)
Patient ID: [name], female   DOB: 07-05-1951, 70 y.o.   MRN: [id number] Patient is seen in follow-up for the gangrenous changes to the right foot fourth and fifth toes.  Vascular surgery has been consulted for endovascular evaluation and possible revascularization.  Due to patient's contractures of the hip and knee endovascular studies were not a possibility.  Patient had been recommended to proceed with above-the-knee amputation and patient request reconsultation regarding possible foot salvage intervention.  At bedside patient has dry gangrenous changes to the fourth and fifth toe with possible gangrenous changes to the lateral aspect of the third toe.  The Doppler at bedside shows strong biphasic dorsalis pedis pulse.  We discussed surgical options including the best opportunity for healing and function would be an above-the-knee amputation on the right.  Patient states she would like to proceed with foot salvage intervention.  Discussed that there is increased risk of the wound not healing but we could proceed with foot salvage intervention with possible third fourth and fifth toe amputations with use of wound VAC postoperatively a nitroglycerin patch and Trental to help with microcirculation.  Patient states she would like to proceed with surgery tomorrow morning.

## 2021-04-12 NOTE — Progress Notes (Signed)
Patients BP 80/48. Asymptomatic. MD paged. Waiting on order for bolus.  Daymon Larsen, RN

## 2021-04-12 NOTE — H&P (View-Only) (Signed)
Patient ID: [name], female   DOB: 06-29-1951, 70 y.o.   MRN: [id number] Patient is seen in follow-up for the gangrenous changes to the right foot fourth and fifth toes.  Vascular surgery has been consulted for endovascular evaluation and possible revascularization.  Due to patient's contractures of the hip and knee endovascular studies were not a possibility.  Patient had been recommended to proceed with above-the-knee amputation and patient request reconsultation regarding possible foot salvage intervention.  At bedside patient has dry gangrenous changes to the fourth and fifth toe with possible gangrenous changes to the lateral aspect of the third toe.  The Doppler at bedside shows strong biphasic dorsalis pedis pulse.  We discussed surgical options including the best opportunity for healing and function would be an above-the-knee amputation on the right.  Patient states she would like to proceed with foot salvage intervention.  Discussed that there is increased risk of the wound not healing but we could proceed with foot salvage intervention with possible third fourth and fifth toe amputations with use of wound VAC postoperatively a nitroglycerin patch and Trental to help with microcirculation.  Patient states she would like to proceed with surgery tomorrow morning.

## 2021-04-12 NOTE — Progress Notes (Addendum)
Pharmacy Antibiotic Note  [name] is a 70 y.o. female admitted on 04/09/2021 with  osteomyelitis .  Pharmacy has been consulted for vancomycin dosing.  WBC 6,4, afebrile SCr 0.61  Ortho plans for surgical intervention 9/18. Will follow their plans for vancomycin duration after surgery.  Plan: Continue Vancomycin 1500 mg IV every 24 hours.  Goal trough 15-20 mcg/mL.    > eAUC 494.8, SCr used 0.8, Cmax 36.9 Monitor renal function, CBC, cultures/sensitivities, LOT  Temp (24hrs), Avg:98.4 F (36.9 C), Min:98.3 F (36.8 C), Max:98.7 F (37.1 C)  Recent Labs  Lab 04/09/21 1400 04/10/21 0427 04/12/21 0732  WBC 14.6* 8.6 6.4  CREATININE 0.73 0.72 0.61     Estimated Creatinine Clearance: 68.2 mL/min (by C-G formula based on SCr of 0.61 mg/dL).    No Known Allergies  Antimicrobials this admission: Vancomycin 9/14 >> Ceftriaxone 0/14>>  Dose adjustments this admission: none  Microbiology results: none  Thank you for allowing pharmacy to participate in this patient's care.  Reatha Harps, PharmD PGY1 Pharmacy Resident 04/12/2021 2:11 PM Check [email or web address] for unit specific pharmacy number

## 2021-04-13 ENCOUNTER — Encounter (HOSPITAL_COMMUNITY): Payer: Self-pay | Admitting: Student in an Organized Health Care Education/Training Program

## 2021-04-13 ENCOUNTER — Encounter (HOSPITAL_COMMUNITY)
Admission: EM | Disposition: A | Discharge status: Skilled Nursing Facility | Payer: Self-pay | Source: Skilled Nursing Facility | Attending: Internal Medicine

## 2021-04-13 ENCOUNTER — Inpatient Hospital Stay (HOSPITAL_COMMUNITY): Payer: Medicare Other | Admitting: Registered Nurse

## 2021-04-13 DIAGNOSIS — I96 Gangrene, not elsewhere classified: Secondary | ICD-10-CM

## 2021-04-13 DIAGNOSIS — M86271 Subacute osteomyelitis, right ankle and foot: Secondary | ICD-10-CM

## 2021-04-13 HISTORY — PX: AMPUTATION: SHX166

## 2021-04-13 LAB — SURGICAL PCR SCREEN
MRSA, PCR: POSITIVE — AB
Staphylococcus aureus: POSITIVE — AB

## 2021-04-13 LAB — BASIC METABOLIC PANEL
Anion gap: 12 (ref 5–15)
BUN: 16 mg/dL (ref 8–23)
CO2: 20 mmol/L — ABNORMAL LOW (ref 22–32)
Calcium: 8.9 mg/dL (ref 8.9–10.3)
Chloride: 103 mmol/L (ref 98–111)
Creatinine, Ser: 0.58 mg/dL (ref 0.44–1.00)
GFR, Estimated: >60 mL/min (ref >60)
Glucose, Bld: 123 mg/dL — ABNORMAL HIGH (ref 70–99)
Potassium: 3.7 mmol/L (ref 3.5–5.1)
Sodium: 135 mmol/L (ref 135–145)

## 2021-04-13 LAB — CBC
HCT: 31.4 % — ABNORMAL LOW (ref 36.0–46.0)
Hemoglobin: 9.7 g/dL — ABNORMAL LOW (ref 12.0–15.0)
MCH: 25.8 pg — ABNORMAL LOW (ref 26.0–34.0)
MCHC: 30.9 g/dL (ref 30.0–36.0)
MCV: 83.5 fL (ref 80.0–100.0)
Platelets: 241 10*3/uL (ref 150–400)
RBC: 3.76 MIL/uL — ABNORMAL LOW (ref 3.87–5.11)
RDW: 15.3 % (ref 11.5–15.5)
WBC: 6.2 10*3/uL (ref 4.0–10.5)
nRBC: 0 % (ref 0.0–0.2)

## 2021-04-13 LAB — MAGNESIUM: Magnesium: 1.9 mg/dL (ref 1.7–2.4)

## 2021-04-13 LAB — APTT: aPTT: 32 seconds (ref 24–36)

## 2021-04-13 LAB — PROTIME-INR
INR: 1.1 (ref 0.8–1.2)
Prothrombin Time: 14.3 seconds (ref 11.4–15.2)

## 2021-04-13 SURGERY — AMPUTATION DIGIT
Anesthesia: Monitor Anesthesia Care | Laterality: Right

## 2021-04-13 MED ORDER — POLYETHYLENE GLYCOL 3350 17 G PO PACK: 17.0000 g | PACK | Freq: Every day | ORAL | Status: DC | PRN

## 2021-04-13 MED ORDER — ONDANSETRON HCL 4 MG/2ML IJ SOLN
INTRAMUSCULAR | Status: AC
  Filled 2021-04-13: qty 2

## 2021-04-13 MED ORDER — LACTATED RINGERS IV SOLN: INTRAVENOUS | Status: DC | PRN

## 2021-04-13 MED ORDER — DIPHENHYDRAMINE HCL 50 MG/ML IJ SOLN
INTRAMUSCULAR | Status: AC
  Filled 2021-04-13: qty 1

## 2021-04-13 MED ORDER — CHLORHEXIDINE GLUCONATE 0.12 % MT SOLN
OROMUCOSAL | Status: AC
  Administered 2021-04-13: 15 mL
  Filled 2021-04-13: qty 15

## 2021-04-13 MED ORDER — MEPERIDINE HCL 25 MG/ML IJ SOLN: 6.2500 mg | INTRAMUSCULAR | Status: DC | PRN

## 2021-04-13 MED ORDER — ZINC SULFATE 220 (50 ZN) MG PO CAPS
220.0000 mg | ORAL_CAPSULE | Freq: Every day | ORAL | Status: DC
Start: 2021-04-13 — End: 2021-04-15
  Administered 2021-04-13 – 2021-04-14 (×2): 220 mg via ORAL
  Filled 2021-04-13 (×2): qty 1

## 2021-04-13 MED ORDER — DEXAMETHASONE SODIUM PHOSPHATE 10 MG/ML IJ SOLN
INTRAMUSCULAR | Status: DC | PRN
  Administered 2021-04-13: 4 mg via INTRAVENOUS

## 2021-04-13 MED ORDER — CEFAZOLIN SODIUM-DEXTROSE 2-3 GM-%(50ML) IV SOLR
INTRAVENOUS | Status: DC | PRN
  Administered 2021-04-13: 2 g via INTRAVENOUS

## 2021-04-13 MED ORDER — DEXAMETHASONE SODIUM PHOSPHATE 10 MG/ML IJ SOLN
INTRAMUSCULAR | Status: AC
  Filled 2021-04-13: qty 1

## 2021-04-13 MED ORDER — ROCURONIUM BROMIDE 10 MG/ML (PF) SYRINGE
PREFILLED_SYRINGE | INTRAVENOUS | Status: AC
  Filled 2021-04-13: qty 10

## 2021-04-13 MED ORDER — CEFAZOLIN SODIUM-DEXTROSE 2-4 GM/100ML-% IV SOLN: 2.0000 g | Freq: Three times a day (TID) | INTRAVENOUS | Status: DC

## 2021-04-13 MED ORDER — PROMETHAZINE HCL 25 MG/ML IJ SOLN: 6.2500 mg | INTRAMUSCULAR | Status: DC | PRN

## 2021-04-13 MED ORDER — ALUM & MAG HYDROXIDE-SIMETH 200-200-20 MG/5ML PO SUSP: 15.0000 mL | ORAL | Status: DC | PRN

## 2021-04-13 MED ORDER — PROPOFOL 10 MG/ML IV BOLUS
INTRAVENOUS | Status: AC
  Filled 2021-04-13: qty 20

## 2021-04-13 MED ORDER — EPHEDRINE 5 MG/ML INJ
INTRAVENOUS | Status: AC
  Filled 2021-04-13: qty 5

## 2021-04-13 MED ORDER — 0.9 % SODIUM CHLORIDE (POUR BTL) OPTIME
TOPICAL | Status: DC | PRN
  Administered 2021-04-13: 1000 mL

## 2021-04-13 MED ORDER — ONDANSETRON HCL 4 MG/2ML IJ SOLN
INTRAMUSCULAR | Status: DC | PRN
  Administered 2021-04-13: 4 mg via INTRAVENOUS

## 2021-04-13 MED ORDER — JUVEN PO PACK
1.0000 | PACK | Freq: Two times a day (BID) | ORAL | Status: DC
  Administered 2021-04-13 – 2021-04-14 (×3): 1 via ORAL
  Filled 2021-04-13 (×2): qty 1

## 2021-04-13 MED ORDER — GUAIFENESIN-DM 100-10 MG/5ML PO SYRP
15.0000 mL | ORAL_SOLUTION | ORAL | Status: DC | PRN
  Administered 2021-04-14: 15 mL via ORAL
  Filled 2021-04-13: qty 15

## 2021-04-13 MED ORDER — FENTANYL CITRATE (PF) 100 MCG/2ML IJ SOLN: 25.0000 ug | INTRAMUSCULAR | Status: DC | PRN

## 2021-04-13 MED ORDER — FENTANYL CITRATE (PF) 250 MCG/5ML IJ SOLN
INTRAMUSCULAR | Status: AC
  Filled 2021-04-13: qty 5

## 2021-04-13 MED ORDER — MAGNESIUM CITRATE PO SOLN
1.0000 | Freq: Once | ORAL | Status: DC | PRN
  Filled 2021-04-13: qty 296

## 2021-04-13 MED ORDER — SUCCINYLCHOLINE CHLORIDE 200 MG/10ML IV SOSY
PREFILLED_SYRINGE | INTRAVENOUS | Status: AC
  Filled 2021-04-13: qty 10

## 2021-04-13 MED ORDER — LIDOCAINE 2% (20 MG/ML) 5 ML SYRINGE
INTRAMUSCULAR | Status: AC
  Filled 2021-04-13: qty 5

## 2021-04-13 MED ORDER — PROPOFOL 500 MG/50ML IV EMUL
INTRAVENOUS | Status: DC | PRN
  Administered 2021-04-13: 75 ug/kg/min via INTRAVENOUS

## 2021-04-13 MED ORDER — DIPHENHYDRAMINE HCL 50 MG/ML IJ SOLN
INTRAMUSCULAR | Status: DC | PRN
  Administered 2021-04-13: 12.5 mg via INTRAVENOUS

## 2021-04-13 MED ORDER — MUPIROCIN 2 % EX OINT
1.0000 "application " | TOPICAL_OINTMENT | Freq: Two times a day (BID) | CUTANEOUS | Status: DC
  Administered 2021-04-13 – 2021-04-14 (×3): 1 via NASAL
  Filled 2021-04-13 (×2): qty 22

## 2021-04-13 MED ORDER — PROPOFOL 10 MG/ML IV BOLUS
INTRAVENOUS | Status: DC | PRN
  Administered 2021-04-13: 20 mg via INTRAVENOUS

## 2021-04-13 MED ORDER — BISACODYL 5 MG PO TBEC: 5.0000 mg | DELAYED_RELEASE_TABLET | Freq: Every day | ORAL | Status: DC | PRN

## 2021-04-13 MED ORDER — LACTATED RINGERS IV SOLN: INTRAVENOUS | Status: DC

## 2021-04-13 MED ORDER — CHLORHEXIDINE GLUCONATE CLOTH 2 % EX PADS
6.0000 | MEDICATED_PAD | Freq: Every day | CUTANEOUS | Status: DC
  Administered 2021-04-14: 6 via TOPICAL

## 2021-04-13 MED ORDER — PHENYLEPHRINE 40 MCG/ML (10ML) SYRINGE FOR IV PUSH (FOR BLOOD PRESSURE SUPPORT)
PREFILLED_SYRINGE | INTRAVENOUS | Status: DC | PRN
  Administered 2021-04-13 (×3): 80 ug via INTRAVENOUS

## 2021-04-13 MED ORDER — ASCORBIC ACID 500 MG PO TABS
1000.0000 mg | ORAL_TABLET | Freq: Every day | ORAL | Status: DC
  Administered 2021-04-13 – 2021-04-14 (×2): 1000 mg via ORAL
  Filled 2021-04-13 (×2): qty 2

## 2021-04-13 MED ORDER — ONDANSETRON HCL 4 MG/2ML IJ SOLN: 4.0000 mg | Freq: Four times a day (QID) | INTRAMUSCULAR | Status: DC | PRN

## 2021-04-13 MED ORDER — PHENYLEPHRINE 40 MCG/ML (10ML) SYRINGE FOR IV PUSH (FOR BLOOD PRESSURE SUPPORT)
PREFILLED_SYRINGE | INTRAVENOUS | Status: AC
  Filled 2021-04-13: qty 10

## 2021-04-13 MED ORDER — PHENOL 1.4 % MT LIQD: 1.0000 | OROMUCOSAL | Status: DC | PRN

## 2021-04-13 MED ORDER — SODIUM CHLORIDE 0.9 % IV SOLN: INTRAVENOUS | Status: DC

## 2021-04-13 SURGICAL SUPPLY — 31 items
BAG COUNTER SPONGE SURGICOUNT (BAG) ×2 IMPLANT
BLADE SURG 21 STRL SS (BLADE) ×2 IMPLANT
BNDG COHESIVE 4X5 TAN STRL (GAUZE/BANDAGES/DRESSINGS) ×2 IMPLANT
BNDG ESMARK 4X9 LF (GAUZE/BANDAGES/DRESSINGS) IMPLANT
BNDG GAUZE ELAST 4 BULKY (GAUZE/BANDAGES/DRESSINGS) ×2 IMPLANT
CANISTER WOUNDNEG PRESSURE 500 (CANNISTER) ×1 IMPLANT
COVER SURGICAL LIGHT HANDLE (MISCELLANEOUS) ×4 IMPLANT
DRAPE DERMATAC (DRAPES) ×2 IMPLANT
DRAPE U-SHAPE 47X51 STRL (DRAPES) ×2 IMPLANT
DRESSING PEEL AND PLC PRVNA 13 (GAUZE/BANDAGES/DRESSINGS) IMPLANT
DRSG ADAPTIC 3X8 NADH LF (GAUZE/BANDAGES/DRESSINGS) IMPLANT
DRSG PAD ABDOMINAL 8X10 ST (GAUZE/BANDAGES/DRESSINGS) ×2 IMPLANT
DRSG PEEL AND PLACE PREVENA 13 (GAUZE/BANDAGES/DRESSINGS) ×2
DURAPREP 26ML APPLICATOR (WOUND CARE) ×2 IMPLANT
ELECT REM PT RETURN 9FT ADLT (ELECTROSURGICAL) ×2
ELECTRODE REM PT RTRN 9FT ADLT (ELECTROSURGICAL) ×1 IMPLANT
GAUZE SPONGE 4X4 12PLY STRL (GAUZE/BANDAGES/DRESSINGS) IMPLANT
GLOVE SURG ORTHO LTX SZ9 (GLOVE) ×2 IMPLANT
GLOVE SURG UNDER POLY LF SZ9 (GLOVE) ×2 IMPLANT
GOWN STRL REUS W/ TWL XL LVL3 (GOWN DISPOSABLE) ×2 IMPLANT
GOWN STRL REUS W/TWL XL LVL3 (GOWN DISPOSABLE) ×2
KIT BASIN OR (CUSTOM PROCEDURE TRAY) ×2 IMPLANT
KIT TURNOVER KIT B (KITS) ×2 IMPLANT
MANIFOLD NEPTUNE II (INSTRUMENTS) ×2 IMPLANT
NEEDLE 22X1 1/2 (OR ONLY) (NEEDLE) IMPLANT
NS IRRIG 1000ML POUR BTL (IV SOLUTION) ×2 IMPLANT
PACK ORTHO EXTREMITY (CUSTOM PROCEDURE TRAY) ×2 IMPLANT
PAD ARMBOARD 7.5X6 YLW CONV (MISCELLANEOUS) ×4 IMPLANT
SUT ETHILON 2 0 PSLX (SUTURE) ×2 IMPLANT
SYR CONTROL 10ML LL (SYRINGE) IMPLANT
TOWEL GREEN STERILE (TOWEL DISPOSABLE) ×2 IMPLANT

## 2021-04-13 NOTE — Progress Notes (Signed)
   Subjective: Interviewed at bedside, a few hours post-op.  Objective:  Vital signs in last 24 hours: Vitals:   04/13/21 0711 04/13/21 0827 04/13/21 0842 04/13/21 0859  BP: 111/72 93/83 (!) 106/59 100/81  Pulse:  69 60 63  Resp:  18 14 16   Temp:  97.6 F (36.4 C) 97.6 F (36.4 C) 98.1 F (36.7 C)  TempSrc:    Oral  SpO2:  100% 100% 98%  Weight:      Height:       Physical Exam Vitals reviewed.  Constitutional:      General: She is not in acute distress. Cardiovascular:     Rate and Rhythm: Normal rate and regular rhythm.  Pulmonary:     Effort: Pulmonary effort is normal.     Breath sounds: Normal breath sounds.  Skin:    General: Skin is warm and dry.  Neurological:     Mental Status: She is alert.  Psychiatric:        Mood and Affect: Mood normal.        Judgment: Judgment normal.     Assessment/Plan:  Principal Problem:   Osteomyelitis ([place]) Active Problems:   Rheumatoid arthritis ([facility])   Essential hypertension   Neurogenic bladder   Paraplegia ([facility])   Dry gangrene ([facility])  Osteomyelitis Digit 4 and 5 of Right Foot Cellulitis of the right foot Peripheral artery disease Patient underwent successful toe amputation this morning. Dr Sharol Given recommends continuing abx for 24H post-op. She has wond vac in place. Goal is to optimize medical management for PAD, including with a statin. Will reintroduce idea of statin today or tomorrow. - s/p toe amputation, POD#0 - continue ceftriaxone and vancomycin for 24H - ASA 41m daily   Chronic cough GERD She reports improvement in cough with Delsym. Her labs remain stable, no leukocytosis. Unclear if improvement is due to delsym or PPI dose increase but will continue to treat both at this time. - continue delsym 151mpo BID - pantoprazole 4096mo BID - sucralfate      LOS: 4 days   Kager, RebJon Gillsedical Student 04/13/2021, 11:17 AM   Attestation for Student Documentation:  I personally was present and  performed or re-performed the history, physical exam and medical decision-making activities of this service and have verified that the service and findings are accurately documented in the student's note.  PhiSanjuan DameD 04/13/2021, 12:56 PM [phone]

## 2021-04-13 NOTE — Op Note (Signed)
04/13/2021  8:28 AM  PATIENT:  [name]    PRE-OPERATIVE DIAGNOSIS:  Gangrene Right 4th and 5th toe  POST-OPERATIVE DIAGNOSIS: Gangrene right third fourth and fifth toe  PROCEDURE:  AMPUTATION third fourth and fifth ray. Application of Prevena wound VAC.  SURGEON:  Newt Minion, MD  PHYSICIAN ASSISTANT:None ANESTHESIA:   General  PREOPERATIVE INDICATIONS:  [name] is a  70 y.o. female with a diagnosis of Gangrene Right 4th and 5th toe who failed conservative measures and elected for surgical management.    The risks benefits and alternatives were discussed with the patient preoperatively including but not limited to the risks of infection, bleeding, nerve injury, cardiopulmonary complications, the need for revision surgery, among others, and the patient was willing to proceed.  OPERATIVE IMPLANTS: Praveena wound VAC  _0 @  OPERATIVE FINDINGS: Good petechial bleeding at the wound edges.  OPERATIVE PROCEDURE: Patient was brought the operating room and underwent a MAC anesthetic.  After adequate levels anesthesia were obtained patient's right lower extremity was prepped using DuraPrep draped into a sterile field a timeout was called.  Examination the patient had ischemic ulceration over the lateral border of the third toe which necessitated a third fourth and fifth ray amputation.  Elliptical incision was made just proximal to the gangrenous necrotic tissue.  This was carried sharply down to bone.  A bone cutter was used to perform a third fourth and fifth ray amputation.  Electrocardio was used for hemostasis.  There was good petechial bleeding along the wound edges.  The wound was closed using 2-0 nylon a Prevena wound VAC was applied this was overwrapped with Coban this had a good suction fit patient was taken the PACU in stable condition.   DISCHARGE PLANNING:  Antibiotic duration: Continue antibiotics for 24 hours  Weightbearing: Not applicable  Pain  medication: Not needed  Dressing care/ Wound VAC: Continue wound VAC for 1 week  Ambulatory devices: Not applicable  Discharge to: Skilled nursing  Follow-up: In the office 1 week post operative.

## 2021-04-13 NOTE — Anesthesia Preprocedure Evaluation (Addendum)
Anesthesia Evaluation  Patient identified by MRN, date of birth, ID band Patient awake    Reviewed: Allergy & Precautions, NPO status , Patient's Chart, lab work & pertinent test results, reviewed documented beta blocker date and time   Airway Mallampati: II  TM Distance: >3 FB Neck ROM: Full    Dental  (+) Edentulous Upper, Edentulous Lower   Pulmonary shortness of breath, pneumonia, former smoker,    Pulmonary exam normal breath sounds clear to auscultation       Cardiovascular hypertension, Pt. on home beta blockers and Pt. on medications + CAD and +CHF  Normal cardiovascular exam Rhythm:Regular Rate:Normal  Echo 04/02/2021 1. Left ventricular ejection fraction, by estimation, is 45 to 50%. The left ventricle has mildly decreased function. The left ventricle demonstrates global hypokinesis. Left ventricular diastolic parameters are consistent with Grade I diastolic dysfunction (impaired relaxation). The average left ventricular global longitudinal strain is -13.5 %. The global longitudinal strain is abnormal.  2. Right ventricular systolic function is normal. The right ventricular size is normal. There is normal pulmonary artery systolic pressure.  3. The mitral valve is normal in structure. No evidence of mitral valve regurgitation. No evidence of mitral stenosis.  4. The aortic valve is normal in structure. Aortic valve regurgitation is not visualized. No aortic stenosis is present.  5. The inferior vena cava is normal in size with greater than 50% respiratory variability, suggesting right atrial pressure of 3 mmHg.    Neuro/Psych PSYCHIATRIC DISORDERS Depression    GI/Hepatic hiatal hernia, GERD  ,(+) Hepatitis -  Endo/Other  negative endocrine ROS  Renal/GU Renal disease     Musculoskeletal  (+) Arthritis ,   Abdominal   Peds  Hematology  (+) Blood dyscrasia, anemia ,   Anesthesia Other Findings    Reproductive/Obstetrics                            Anesthesia Physical Anesthesia Plan  ASA: 3  Anesthesia Plan: MAC   Post-op Pain Management:    Induction: Intravenous  PONV Risk Score and Plan: 3 and Ondansetron, Treatment may vary due to age or medical condition, Dexamethasone and Diphenhydramine  Airway Management Planned: Natural Airway  Additional Equipment: None  Intra-op Plan:   Post-operative Plan:   Informed Consent: I have reviewed the patients History and Physical, chart, labs and discussed the procedure including the risks, benefits and alternatives for the proposed anesthesia with the patient or authorized representative who has indicated his/her understanding and acceptance.     Dental advisory given  Plan Discussed with: CRNA  Anesthesia Plan Comments:        Anesthesia Quick Evaluation

## 2021-04-13 NOTE — Anesthesia Procedure Notes (Signed)
Procedure Name: MAC Date/Time: 04/13/2021 7:44 AM Performed by: Jearld Pies, CRNA Pre-anesthesia Checklist: Patient identified, Emergency Drugs available, Suction available, Patient being monitored and Timeout performed Patient Re-evaluated:Patient Re-evaluated prior to induction Oxygen Delivery Method: Nasal cannula Preoxygenation: Pre-oxygenation with 100% oxygen

## 2021-04-13 NOTE — Anesthesia Postprocedure Evaluation (Signed)
Anesthesia Post Note  Patient: [name]  Procedure(s) Performed: AMPUTATION 4TH AND 5TH (Right)     Patient location during evaluation: PACU Anesthesia Type: MAC Level of consciousness: awake and alert Pain management: pain level controlled Vital Signs Assessment: post-procedure vital signs reviewed and stable Respiratory status: spontaneous breathing Cardiovascular status: stable Anesthetic complications: no   No notable events documented.  Last Vitals:  Vitals:   04/13/21 0859 04/13/21 1219  BP: 100/81 (!) 118/97  Pulse: 63   Resp: 16   Temp: 36.7 C   SpO2: 98%     Last Pain:  Vitals:   04/13/21 0859  TempSrc: Oral  PainSc:                  Nolon Nations

## 2021-04-13 NOTE — Interval H&P Note (Signed)
History and Physical Interval Note:  04/13/2021 7:40 AM  [name]  has presented today for surgery, with the diagnosis of Gangrene Right 4th and 5th toe.  The various methods of treatment have been discussed with the patient and family. After consideration of risks, benefits and other options for treatment, the patient has consented to  Procedure(s): AMPUTATION 4TH AND 5TH (Right) as a surgical intervention.  The patient's history has been reviewed, patient examined, no change in status, stable for surgery.  I have reviewed the patient's chart and labs.  Questions were answered to the patient's satisfaction.     Newt Minion

## 2021-04-13 NOTE — Transfer of Care (Signed)
Immediate Anesthesia Transfer of Care Note  Patient: [name]  Procedure(s) Performed: AMPUTATION 4TH AND 5TH (Right)  Patient Location: PACU  Anesthesia Type:MAC  Level of Consciousness: awake, alert  and oriented  Airway & Oxygen Therapy: Patient Spontanous Breathing and Patient connected to nasal cannula oxygen  Post-op Assessment: Report given to RN and Post -op Vital signs reviewed and stable  Post vital signs: Reviewed and stable  Last Vitals:  Vitals Value Taken Time  BP 93/83 04/13/21 0827  Temp    Pulse 63 04/13/21 0829  Resp 15 04/13/21 0829  SpO2 100 % 04/13/21 0829  Vitals shown include unvalidated device data.  Last Pain:  Vitals:   04/13/21 0658  TempSrc: Oral  PainSc: 0-No pain         Complications: No notable events documented.

## 2021-04-14 ENCOUNTER — Other Ambulatory Visit (HOSPITAL_COMMUNITY): Payer: Self-pay

## 2021-04-14 ENCOUNTER — Encounter (HOSPITAL_COMMUNITY): Payer: Self-pay | Admitting: Orthopedic Surgery

## 2021-04-14 DIAGNOSIS — M86279 Subacute osteomyelitis, unspecified ankle and foot: Secondary | ICD-10-CM

## 2021-04-14 LAB — BASIC METABOLIC PANEL
Anion gap: 10 (ref 5–15)
BUN: 17 mg/dL (ref 8–23)
CO2: 20 mmol/L — ABNORMAL LOW (ref 22–32)
Calcium: 9 mg/dL (ref 8.9–10.3)
Chloride: 104 mmol/L (ref 98–111)
Creatinine, Ser: 0.78 mg/dL (ref 0.44–1.00)
GFR, Estimated: >60 mL/min (ref >60)
Glucose, Bld: 100 mg/dL — ABNORMAL HIGH (ref 70–99)
Potassium: 4 mmol/L (ref 3.5–5.1)
Sodium: 134 mmol/L — ABNORMAL LOW (ref 135–145)

## 2021-04-14 LAB — CBC
HCT: 32.2 % — ABNORMAL LOW (ref 36.0–46.0)
Hemoglobin: 9.9 g/dL — ABNORMAL LOW (ref 12.0–15.0)
MCH: 25.6 pg — ABNORMAL LOW (ref 26.0–34.0)
MCHC: 30.7 g/dL (ref 30.0–36.0)
MCV: 83.4 fL (ref 80.0–100.0)
Platelets: 270 10*3/uL (ref 150–400)
RBC: 3.86 MIL/uL — ABNORMAL LOW (ref 3.87–5.11)
RDW: 15.3 % (ref 11.5–15.5)
WBC: 8.1 10*3/uL (ref 4.0–10.5)
nRBC: 0 % (ref 0.0–0.2)

## 2021-04-14 MED ORDER — LIP MEDEX EX OINT
TOPICAL_OINTMENT | CUTANEOUS | Status: DC | PRN
  Filled 2021-04-14: qty 7

## 2021-04-14 MED ORDER — DEXTROMETHORPHAN-GUAIFENESIN 10-100 MG/5ML PO SYRP
15.0000 mL | ORAL_SOLUTION | ORAL | 0 refills | Status: DC | PRN
  Filled 2021-04-14: qty 118, 2d supply, fill #0

## 2021-04-14 MED ORDER — ZINC SULFATE 220 (50 ZN) MG PO TABS
220.0000 mg | ORAL_TABLET | Freq: Every day | ORAL | 0 refills | Status: AC
  Filled 2021-04-14: qty 14, 14d supply, fill #0

## 2021-04-14 MED ORDER — ATORVASTATIN CALCIUM 40 MG PO TABS
40.0000 mg | ORAL_TABLET | Freq: Every day | ORAL | 0 refills | Status: DC
  Filled 2021-04-14: qty 30, 30d supply, fill #0

## 2021-04-14 MED ORDER — ZINC OXIDE 12.8 % EX OINT
1.0000 "application " | TOPICAL_OINTMENT | CUTANEOUS | 0 refills | Status: DC | PRN
  Filled 2021-04-14: qty 56.7, fill #0

## 2021-04-14 MED ORDER — PANTOPRAZOLE SODIUM 40 MG PO TBEC
40.0000 mg | DELAYED_RELEASE_TABLET | Freq: Two times a day (BID) | ORAL | 0 refills | Status: DC
  Filled 2021-04-14: qty 60, 30d supply, fill #0

## 2021-04-14 MED ORDER — ASPIRIN 81 MG PO TBEC
81.0000 mg | DELAYED_RELEASE_TABLET | Freq: Every day | ORAL | 0 refills | Status: AC
  Filled 2021-04-14: qty 30, 30d supply, fill #0

## 2021-04-14 NOTE — Progress Notes (Addendum)
Attempted to call report x1 hour to [place] at [phone]. No answer, will attempt again.    Report called to [facility], all questions answered to satisfaction.   Teigen Bellin M

## 2021-04-14 NOTE — Discharge Summary (Signed)
Name: [name] MRN: [id number] DOB: 1951-06-12 70 y.o. PCP: Caprice Renshaw, MD  Date of Admission: 04/09/2021 12:51 PM Date of Discharge: 04/14/2021 Attending Physician: Aldine Contes, MD  Discharge Diagnosis: 1. Osteomyelitis of 4th and 5th digits on R foot 2. Chronic cough 3. Autonomic dysfunction  Discharge Medications: Allergies as of 04/14/2021   No Known Allergies      Medication List     STOP taking these medications    omeprazole 20 MG capsule Commonly known as: PRILOSEC   omeprazole 40 MG capsule Commonly known as: PRILOSEC       TAKE these medications    acetaminophen 325 MG tablet Commonly known as: TYLENOL Take 650 mg by mouth every 6 (six) hours as needed for mild pain or fever.   albuterol 108 (90 Base) MCG/ACT inhaler Commonly known as: VENTOLIN HFA Inhale 2 puffs into the lungs every 4 (four) hours as needed for wheezing or shortness of breath.   alum & mag hydroxide-simeth 846-962-95 MG/5ML suspension Commonly known as: MAALOX PLUS Take 10 mLs by mouth every 6 (six) hours as needed for indigestion (nausea, Gas).   aspirin 81 MG EC tablet Take 1 tablet (81 mg total) by mouth daily. Swallow whole. Start taking on: April 15, 2021   atorvastatin 40 MG tablet Commonly known as: Lipitor Take 1 tablet (40 mg total) by mouth daily.   baclofen 10 MG tablet Commonly known as: LIORESAL Take 10 mg by mouth 2 (two) times daily. In addition to 47m given twice daily at different times. Takes Baclofen a total of four times daily.   Baclofen 5 MG Tabs Take 15 mg by mouth in the morning and at bedtime. In addition to 195mgiven twice daily at different times. Takes Baclofen a total of four times daily.   Benzocaine 10 MG Lozg Use as directed 1 lozenge in the mouth or throat every 2 (two) hours as needed (sore throat).   benzonatate 100 MG capsule Commonly known as: TESSALON Take 1 capsule (100 mg total) by mouth every 8 (eight) hours.    bisacodyl 10 MG suppository Commonly known as: DULCOLAX Place 1 suppository (10 mg total) rectally every Tuesday, Thursday, Saturday, and Sunday at 6 PM.   calcium-vitamin D 500-200 MG-UNIT tablet Commonly known as: OSCAL WITH D Take 1 tablet by mouth 2 (two) times daily.   Carboxymethylcellulose Sod PF 0.5 % Soln Place 1 drop into both eyes in the morning, at noon, in the evening, and at bedtime.   carvedilol 12.5 MG tablet Commonly known as: COREG Take 1 tablet (12.5 mg total) by mouth 2 (two) times daily with a meal. What changed: additional instructions   carvedilol 6.25 MG tablet Commonly known as: COREG Take 1 tablet (6.25 mg total) by mouth 2 (two) times daily. Take with 12.5 to make 18.75 twice daily. What changed: additional instructions   cetirizine 10 MG tablet Commonly known as: ZYRTEC Take 10 mg by mouth daily as needed for allergies.   cholecalciferol 1000 units tablet Commonly known as: VITAMIN D Take 1,000 Units by mouth daily.   Cranberry 450 MG Tabs Take 450 mg by mouth daily.   desonide 0.05 % ointment Commonly known as: DESOWEN Apply 1 application topically 2 (two) times daily as needed (lower lip dryness).   diclofenac Sodium 1 % Gel Commonly known as: VOLTAREN Apply 2 g topically in the morning, at noon, and at bedtime. To bilateral shoulders   docusate sodium 100 MG capsule Commonly known as:  COLACE Take 100 mg by mouth in the morning, at noon, and at bedtime.   furosemide 40 MG tablet Commonly known as: LASIX Take 40 mg by mouth 2 (two) times daily.   guaiFENesin-dextromethorphan 100-10 MG/5ML syrup Commonly known as: ROBITUSSIN DM Take 15 mLs by mouth every 4 (four) hours as needed for cough.   hydrocortisone 2.5 % lotion Apply 1 application topically every 2 (two) hours as needed (itching of arms and legs).   hydroquinone 2 % cream Apply 1 application topically 2 (two) times daily. To dark spots   hydroxychloroquine 200 MG  tablet Commonly known as: PLAQUENIL Take 200 mg by mouth daily.   linaclotide 290 MCG Caps capsule Commonly known as: LINZESS Take 290 mcg by mouth daily before breakfast.   losartan 25 MG tablet Commonly known as: COZAAR Take 25 mg by mouth daily.   Lumify 0.025 % Soln Generic drug: Brimonidine Tartrate Place 1 drop into both eyes daily as needed (redness).   Melatonin 3 MG Tbdp Take 3 mg by mouth at bedtime as needed (sleep).   multivitamin with minerals Tabs tablet Take 1 tablet by mouth daily.   oxybutynin 5 MG 24 hr tablet Commonly known as: DITROPAN-XL Take 5 mg by mouth daily.   pantoprazole 40 MG tablet Commonly known as: PROTONIX Take 1 tablet (40 mg total) by mouth 2 (two) times daily.   polyethylene glycol 17 g packet Commonly known as: MIRALAX / GLYCOLAX Take 17 g by mouth every Monday, Wednesday, Friday, and Saturday at 6 PM.   prenatal multivitamin Tabs tablet Take 1 tablet by mouth daily at 12 noon.   sennosides-docusate sodium 8.6-50 MG tablet Commonly known as: SENOKOT-S Take 2 tablets by mouth 2 (two) times daily. What changed: when to take this   sertraline 50 MG tablet Commonly known as: ZOLOFT Take 50 mg by mouth daily.   spironolactone 100 MG tablet Commonly known as: ALDACTONE Take 1 tablet (100 mg total) by mouth daily. For edema   sucralfate 1 GM/10ML suspension Commonly known as: CARAFATE Take 1 g by mouth every 6 (six) hours as needed (for [place]).   SYSTANE COMPLETE OP Place 2 drops into both eyes in the morning, at noon, in the evening, and at bedtime.   tiZANidine 4 MG tablet Commonly known as: ZANAFLEX Take 4 mg by mouth 3 (three) times daily.   traMADol 50 MG tablet Commonly known as: ULTRAM Take 1 tablet (50 mg total) by mouth every 6 (six) hours as needed. What changed: reasons to take this   Zinc Oxide 12.8 % ointment Commonly known as: TRIPLE PASTE Apply 1 application topically as needed for irritation.   zinc  sulfate 220 (50 Zn) MG capsule Take 1 capsule (220 mg total) by mouth daily for 14 days. Start taking on: April 15, 2021        Disposition and follow-up:   Ms.Georgi B Trevathan was discharged from [facility] in Stable condition.  At the hospital follow up visit please address:  1.  Osteomyelitis: Antibiotics no longer needed given source control. Patient to follow-up with Dr. Sharol Given in one week. Patient should continue with aspirin and atorvastatin to reduce further risks of PAD.  2. Chronic cough: Patient noticed improvement of symptoms with Delsym BID and protonix BID. Will continue at discharge. Suspect likely 2/2 GERD.  3.  Labs / imaging needed at time of follow-up: CBC, BMP  4.  Pending labs/ test needing follow-up: n/a  Follow-up Appointments:  Follow-up  Information     Newt Minion, MD Follow up in 1 week(s).   Specialty: Orthopedic Surgery Contact information: [address] [place] [address] [phone]                 Hospital Course by problem list: Osteomyelitis and dry gangrene of right foot digits 4 and 5 Ms Pazos presented with necrotic digits on the right foot, digits 4 and 5, that started as a small cut 2 weeks prior. Xray findings were consistent with osteomyelitis, and inflammatory markers were elevated ESR 104 and CRP 19.7. She did not have a fever on presentation, and had mild leukocytosis 14.6 which quickly resolved. She was started on vancomycin and ceftriaxone. Upon further investigation of risk factors for peripheral artery disease, labs show LDL 147, A1c 5.7. ABI's revealed moderate PAD in bilateral lower extremities. An above-knee-amputation was recommended by orthopedics after she was found to be poor candidate for revascularization due to severe leg contractures and immobility secondary to paraplegia. Ms Swiss opted for limb-preserving surgery with toe amputation of 3rd-5th digits, which was performed on  04/13/2021. Antibiotics were continued for 24 hours post-op, and wound vac was in place immediately post-op and to be continued for 1 week with follow up in orthopedic office in exactly 1 week. We want to optimize medical management of her PAD with ASA 16m daily and atorvastatin 454m 2.   Chronic cough Ms CrHelbigas had a chronic cough since June 2022 which has been worked up extensively outpatient. We repeated a chest xray during admission which was nonconcerning. An extensive respiratory viral panel was also negative. Unclear etiology of cough but considering GERD as possible cause as she also had recent worsening of heart burn symptoms. She had seen her GI doctor before presenting to the hospital and they increased her omeprazole dose. During this admission she was given pantoprazole 4061mID and delsym 26m21mD, and she noticed an improvement in both GERD symptoms and cough.  3.  Autonomic dysfunction Throughout hospitalization, patient's blood pressure fluctuated without any obvious cause. Per chart review, this appears to be patient's baseline given autonomic dysfunction 2/2 previous spinal cord injury. Will have patient continue regular home medications at discharge.  Discharge Exam:   BP 124/69 (BP Location: Right Arm)   Pulse 75   Temp 98.2 F (36.8 C) (Oral)   Resp 14   Ht 5' 5.5" (1.664 m)   Wt 77.6 kg   SpO2 95%   BMI 28.02 kg/m  Discharge exam:  General: Resting comfortably in bed, no acute distress CV: Regular rate, rhythm. No murmurs, rubs, gallops appreciated. Pulm: Normal work of breathing on room air. Clear to auscultation bilaterally. MSK: S/p R toe amputation, wrapped w/ gauze and wound vac in place Neuro: Awake, alert, conversing appropriately Psych: Normal mood, affect, speech  Pertinent Labs, Studies, and Procedures:  CBC Latest Ref Rng & Units 04/14/2021 04/13/2021 04/12/2021  WBC 4.0 - 10.5 K/uL 8.1 6.2 6.4  Hemoglobin 12.0 - 15.0 g/dL 9.9(L) 9.7(L) 10.3(L)   Hematocrit 36.0 - 46.0 % 32.2(L) 31.4(L) 33.3(L)  Platelets 150 - 400 K/uL 270 241 257   BMP Latest Ref Rng & Units 04/14/2021 04/13/2021 04/12/2021  Glucose 70 - 99 mg/dL 100(H) 123(H) 99  BUN 8 - 23 mg/dL 17 16 12   Creatinine 0.44 - 1.00 mg/dL 0.78 0.58 0.61  BUN/Creat Ratio 6 - 22 (calc) - - -  Sodium 135 - 145 mmol/L 134(L) 135 137  Potassium 3.5 - 5.1  mmol/L 4.0 3.7 3.4(L)  Chloride 98 - 111 mmol/L 104 103 104  CO2 22 - 32 mmol/L 20(L) 20(L) 22  Calcium 8.9 - 10.3 mg/dL 9.0 8.9 9.3    XR R foot 04/09/2021 Erosions of the 5th middle and distal phalanges and the 4th proximal phalanx are not associated with any obvious overlying skin defect and are age indeterminate. Given reported sores on the 4thand 5th toes, these findings could reflect acute osteomyelitis.  Discharge Instructions: Discharge Instructions     Call MD for:  difficulty breathing, headache or visual disturbances   Complete by: As directed    Call MD for:  extreme fatigue   Complete by: As directed    Call MD for:  hives   Complete by: As directed    Call MD for:  persistant dizziness or light-headedness   Complete by: As directed    Call MD for:  persistant nausea and vomiting   Complete by: As directed    Call MD for:  redness, tenderness, or signs of infection (pain, swelling, redness, odor or green/yellow discharge around incision site)   Complete by: As directed    Call MD for:  severe uncontrolled pain   Complete by: As directed    Call MD for:  temperature >100.4   Complete by: As directed    Diet - low sodium heart healthy   Complete by: As directed    Discharge instructions   Complete by: As directed    Ms. Rouch,  I am so glad you are feeling better and can be discharged from the hospital. You were admitted due to gangrenous toes on your right foot. Our vascular studies showed you have poor blood flow to both of your feet. This makes it hard for any wounds to heal. Therefore, Dr. Sharol Given performed toe  amputations on 04/13/2021. The wound vac is in place and he will see you at his office in one week. No need for further antibiotics at this time.  We would like for you to start taking the following medications: Aspirin 76m daily Atorvastatin 426mdaily Guafenesin cough syrup twice daily Pantoprazole 4040mwice daily  Please make sure to follow-up with Dr. DudSharol Given one week.  It was a pleasure meeting you, Ms. Bayless. We wish you the best and hope you stay happy and healthy!  Thank you, RebHeide SparkS4 PhiSanjuan DameD   Increase activity slowly   Complete by: As directed    Negative Pressure Wound Therapy - Incisional   Complete by: As directed    No wound care   Complete by: As directed       Signed: BraSanjuan DameD 04/14/2021, 12:13 PM   Pager: [phone]

## 2021-04-14 NOTE — Progress Notes (Signed)
Mobility Specialist Progress Note    04/14/21 1406  Mobility  Activity  (wheelchair mobility)  Level of Assistance +2 (takes two people)  Assistive Device MaxiMove;Wheelchair  Distance Ambulated (ft) 250 ft  Mobility Ambulated with assistance in hallway  Mobility Response Tolerated well  Mobility performed by Mobility specialist  $Mobility charge 1 Mobility   Pt found in bed and agreeable to mobility. X12 assisted leg extensions on each leg. Transferred to wheelchair for hallway mobility. Upon return, 2 more sets of x12 leg extensions on each leg. Pt left in bed with call bell in reach.   Pre-Mobility: 82 HR, 98/74 BP  Astronomer Phone: [phone]

## 2021-04-14 NOTE — TOC Initial Note (Signed)
Transition of Care [facility]) - Initial/Assessment Note    Patient Details  Name: [name] KEEVEN MRN: [id number] Date of Birth: 01-Nov-1950  [facility]) CM/SW Contact:    Tresa Endo Phone Number: 04/14/2021, 3:56 PM  Clinical Narrative:                 CSW spoke with pt about DC, pt stated that she is from [place]. CSW contacted Teressa from [facility] to confirm pt return Rosanne Ashing shared that pt would need a new auth. CSW started pt auth but had no PT notes.   CSW contacted pt to ask about PT sessions pt stated she did not have PT but has lived at [facility] for 13 years. CSW contacted Teressa at [facility] to confirm pt DC back for LTC because she has no PT recommendation. CSW will begin pt DC back to [facility] to room 127c.        Patient Goals and CMS Choice        Expected Discharge Plan and Services           Expected Discharge Date: 04/14/21                                    Prior Living Arrangements/Services                       Activities of Daily Living      Permission Sought/Granted                  Emotional Assessment              Admission diagnosis:  Cough [R05.9] Osteomyelitis ([place]) [M86.9] Other acute osteomyelitis of right foot [facility]) [M86.171] Patient Active Problem List   Diagnosis Date Noted   Dry gangrene ([place]) 04/10/2021   Osteomyelitis ([place]) 04/09/2021   Hiatal hernia with GERD 02/07/2021   Hepatocellular carcinoma ([place]) 02/07/2021   Sepsis secondary to UTI ([place]) 12/08/2020   Spasticity 12/06/2020   Contracture of lower leg joint 12/06/2020   Autonomic dysfunction 08/30/2020   Neurogenic bowel 08/30/2020   Wheelchair dependence 08/30/2020   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) 06/29/2017   Pressure injury of skin 06/29/2017   Shortness of breath    Hypokalemia 09/21/2016   Fever in adult 07/28/2016   Decubitus ulcer of coccyx 02/04/2016   Hyperglycemia 12/03/2015   H/O bilateral mastectomy 07/25/2015   Benign hypertensive heart disease without heart failure 07/15/2015   Hiatal hernia 07/06/2015   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   History of breast cancer 08/21/2014   Adynamic ileus ([place]) 07/22/2014   Elevated liver enzymes 07/22/2014   CHF (congestive heart failure) ([place]) 07/05/2014   Gastroparesis 07/05/2014   Microcytic anemia 01/05/2014   Insomnia 07/14/2013   Edema 05/01/2013   Rheumatoid arthritis ([place]) 02/23/2013   GERD (gastroesophageal reflux disease) 02/23/2013   Essential hypertension  02/23/2013   Neurogenic bladder 02/23/2013   Paraplegia ([place]) 02/23/2013   Depression 02/23/2013   Allergic rhinitis 02/23/2013   Constipation 02/23/2013   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 [address] Phone: [phone] Fax: [phone]     Social Determinants of Health (SDOH) Interventions    Readmission Risk Interventions No flowsheet data found.

## 2021-04-14 NOTE — Progress Notes (Signed)
Patient ID: [name], female   DOB: 21-Sep-1950, 70 y.o.   MRN: [id number] Patient is postoperative day 1 amputation of toes 3 4 and 5 of the right foot.  There is no drainage in the wound VAC canister there is a good suction fit.  Patient may return back to her skilled nursing facility.  She will discharge with the portable Praveena wound VAC pump that is in her room.  I will follow-up in the office in 1 week.  The VAC and dressing will remain in place until follow-up in the office.  Patient has no restrictions with transfers or exercises.

## 2021-04-14 NOTE — TOC Transition Note (Signed)
Transition of Care [facility]) - CM/SW Discharge Note   Patient Details  Name: [name] MRN: [id number] Date of Birth: 1951/02/25  Transition of Care [facility]) CM/SW Contact:  Tresa Endo Phone Number: 04/14/2021, 5:04 PM   Clinical Narrative:    Patient will DC to: [facility]  Anticipated DC date: 04/14/2021 Family notified: Pt Sister Transport by: Corey Harold    Per MD patient ready for DC to [facility] room 127c. RN to call report prior to discharge [phone]). RN, patient, patient's family, and facility notified of DC. Discharge Summary and FL2 sent to facility. DC packet on chart. Ambulance transport requested for patient.   CSW will sign off for now as social work intervention is no longer needed. Please consult [place] again if new needs arise.           Patient Goals and CMS Choice        Discharge Placement                       Discharge Plan and Services                                     Social Determinants of Health (SDOH) Interventions     Readmission Risk Interventions No flowsheet data found.

## 2021-04-14 NOTE — Plan of Care (Signed)
  Problem: Education: Goal: Knowledge of General Education information will improve Description Including pain rating scale, medication(s)/side effects and non-pharmacologic comfort measures Outcome: Progressing   

## 2021-04-14 NOTE — Progress Notes (Addendum)
Initial Nutrition Assessment  DOCUMENTATION CODES:   Not applicable  INTERVENTION:  - Continue Juven BID  - Ensure Enlive po BID, each supplement provides 350 kcal and 20 grams of protein  - Start MVI w/ minerals daily   NUTRITION DIAGNOSIS:   Increased nutrient needs related to post-op healing, wound healing as evidenced by estimated needs.   GOAL:   Patient will meet greater than or equal to 90% of their needs  MONITOR:   PO intake, Supplement acceptance, Skin  REASON FOR ASSESSMENT:   Malnutrition Screening Tool    ASSESSMENT:   70 y.o. female presented to the ED on 9/14 with wounds on R toes. PMH of CKD, iron deficiency anemia, Hepatitis C, HTN, liver cell carcinoma, CHF, and paraplegic. Pt admitted with dry gangrene on R 4th and 5th toes.  9/18: Amputation of 3rd, 4th, and 5, R toes - Wound Vac  Pt reports that her appetite is good and has not noticed a decrease in it. Reports that she typically does not eat breakfast because she likes to sleep in, but she has ordered a few trays while being in the hospital. Pt reports that she eats whatever is served to her at the SNF for lunch and dinner.   Pt reports that her UBW is 167#. Reports that she has not notice any weight loss, but if she has it has been in her legs. Per EMR, no significant weight changes, representing pt has maintained her weight.   Discussed ONS with pt; pt agreeable to Ensure BID. Discussed the uses of Juven to promote wound healing.   Medications reviewed and include: Vitamin C, Mylanta, Linzess, Protonix, Zinc, IV antibiotics Labs reviewed.   NUTRITION - FOCUSED PHYSICAL EXAM:  Flowsheet Row Most Recent Value  Orbital Region No depletion  Upper Arm Region No depletion  Thoracic and Lumbar Region No depletion  Buccal Region No depletion  Temple Region No depletion  Clavicle Bone Region No depletion  Clavicle and Acromion Bone Region No depletion  Scapular Bone Region No depletion  Dorsal  Hand No depletion  Patellar Region Moderate depletion  [Paraplegic]  Anterior Thigh Region Moderate depletion  [Paraplegic]  Posterior Calf Region Moderate depletion  [Paraplegic]  Edema (RD Assessment) None  Hair Reviewed  Eyes Reviewed  Mouth Reviewed  Skin Reviewed  Nails Reviewed       Diet Order:   Diet Order             Diet - low sodium heart healthy           Diet regular Room service appropriate? Yes; Fluid consistency: Thin  Diet effective now                   EDUCATION NEEDS:   No education needs have been identified at this time  Skin:  Skin Assessment: Stage II: Buttocks Wound Vac: R foot Incisions: R foot  Last BM:  9/18 - Type 6  Height:   Ht Readings from Last 1 Encounters:  04/09/21 5' 5.5" (1.664 m)    Weight:   Wt Readings from Last 1 Encounters:  04/09/21 77.6 kg    Ideal Body Weight:  58 kg  BMI:  Body mass index is 28.02 kg/m.  Estimated Nutritional Needs:   Kcal:  2000-2200  Protein:  100-120 grams  Fluid:  >/= 2 L    Harlo Jaso BS, PLDN Clinical Dietitian See AMiON for contact information.

## 2021-04-14 NOTE — Progress Notes (Addendum)
   Subjective: Patient interviewed at bedside. She says she feels good this morning and she appears well and happy. We discussed probable discharge today and reassured for concerns about wound care and follow up, explaining she will see Dr Sharol Given in the clinic in 1 week. She was reassured and agreeable to discharge, pending transportation and [facility].  Objective:  Vital signs in last 24 hours: Vitals:   04/13/21 1729 04/13/21 2101 04/14/21 0044 04/14/21 0045  BP: 107/63 104/66  (!) 97/49  Pulse:  69    Resp:  20  19  Temp:  98.1 F (36.7 C) 98.3 F (36.8 C) 98.3 F (36.8 C)  TempSrc:  Oral Oral Oral  SpO2:  97%  96%  Weight:      Height:       Physical Exam Vitals and nursing note reviewed.  Constitutional:      General: She is not in acute distress. Cardiovascular:     Rate and Rhythm: Normal rate and regular rhythm.     Pulses: Normal pulses.  Pulmonary:     Effort: Pulmonary effort is normal.     Breath sounds: Normal breath sounds. No wheezing, rhonchi or rales.  Abdominal:     Palpations: Abdomen is soft.  Skin:    General: Skin is warm and dry.     Findings: No rash.  Neurological:     Mental Status: She is alert.     Assessment/Plan:  Principal Problem:   Osteomyelitis ([place]) Active Problems:   Rheumatoid arthritis (HCC)   Essential hypertension   Neurogenic bladder   Paraplegia (HCC)   Dry gangrene (HCC)  Osteomyelitis Digit 4 and 5 of Right Foot Cellulitis of the right foot Peripheral artery disease POD#1 s/p right foot digits amputation. No long term antibiotics needed at this time and will d/c today. Wound vac remains in place and will stay on until f/u with Dr Sharol Given. Goal is to optimize medical management for PAD, including with a statin. Will recommend to discuss statin in outpatient setting. - d/c antibiotics - ASA 64m daily - f/u with ortho office in 1 week - discuss addition of statin in outpatient clinic   Chronic cough GERD Cough improved  as well as GERD symptoms. Continue current regimen. - continue delsym 138mpo BID - pantoprazole 402mo BID, switch to omeprazole on discharge - sucralfate   Dispo: [facility], pending transportation and bed availability   LOS: 5 days   Peta Peachey, RebJon Gillsedical Student 04/14/2021, 6:13 AM

## 2021-04-14 NOTE — NC FL2 (Signed)
[place] LEVEL OF CARE SCREENING TOOL     IDENTIFICATION  Patient Name: [name] Birthdate: 1951/03/18 Sex: female Admission Date (Current Location): 04/09/2021  [facility] and [place] Number:  Herbalist and Address:  [facility], [place] [address] [place],  [address]      Provider Number: [id number]  Attending Physician Name and Address:  Aldine Contes, MD  Relative Name and Phone Number:       Current Level of Care: Hospital Recommended Level of Care: [place] Prior Approval Number:    Date Approved/Denied:   PASRR Number: [id number]  Discharge Plan: SNF    Current Diagnoses: Patient Active Problem List   Diagnosis Date Noted   Dry gangrene ([place]) 04/10/2021   Osteomyelitis ([place]) 04/09/2021   Hiatal hernia with GERD 02/07/2021   Hepatocellular carcinoma ([place]) 02/07/2021   Sepsis secondary to UTI ([place]) 12/08/2020   Spasticity 12/06/2020   Contracture of lower leg joint 12/06/2020   Autonomic dysfunction 08/30/2020   Neurogenic bowel 08/30/2020   Wheelchair dependence 08/30/2020   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   Medication management 06/07/2018   Cough 06/07/2018   Cirrhosis ([place]) 04/20/2018   Obstipation/fecal impaction 02/12/2018   Intractable vomiting 02/11/2018   Chronic hepatitis C without hepatic coma ([place]) 01/10/2018   NICM (nonischemic cardiomyopathy) ([place]) 08/25/2017   Normal coronary arteries 00/86/7619   Diastolic dysfunction 50/93/2671   S/P thoracentesis    Status post thoracentesis    S/p percutaneous right heart catheterization    Pleural effusion on right 06/29/2017   Respiratory failure with hypoxia ([place]) 06/29/2017   Pressure injury of skin 06/29/2017   Shortness of breath    Hypokalemia 09/21/2016   Fever in adult 07/28/2016   Decubitus ulcer of coccyx 02/04/2016   Hyperglycemia 12/03/2015   H/O bilateral mastectomy 07/25/2015    Benign hypertensive heart disease without heart failure 07/15/2015   Hiatal hernia 07/06/2015   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   History of breast cancer 08/21/2014   Adynamic ileus ([place]) 07/22/2014   Elevated liver enzymes 07/22/2014   CHF (congestive heart failure) ([place]) 07/05/2014   Gastroparesis 07/05/2014   Microcytic anemia 01/05/2014   Insomnia 07/14/2013   Edema 05/01/2013   Rheumatoid arthritis ([place]) 02/23/2013   GERD (gastroesophageal reflux disease) 02/23/2013   Essential hypertension 02/23/2013   Neurogenic bladder 02/23/2013   Paraplegia ([place]) 02/23/2013   Depression 02/23/2013   Allergic rhinitis 02/23/2013   Constipation 02/23/2013   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/24/2011   Breast cancer, left breast ([place]) 09/08/2011    Orientation RESPIRATION BLADDER Height & Weight     Self, Situation, Place, Time  Normal Incontinent Weight: 171 lb (77.6 kg) Height:  5' 5.5" (166.4 cm)  BEHAVIORAL SYMPTOMS/MOOD NEUROLOGICAL BOWEL NUTRITION STATUS      Incontinent Diet  AMBULATORY STATUS COMMUNICATION OF NEEDS Skin   Extensive Assist Verbally PU Stage and Appropriate Care, Skin abrasions                       Personal Care Assistance Level of Assistance  Bathing, Feeding, Dressing Bathing Assistance: Maximum assistance Feeding assistance: Limited assistance Dressing Assistance: Maximum assistance     Functional Limitations Info             SPECIAL CARE FACTORS FREQUENCY  PT (By licensed PT), OT (By licensed OT)     PT Frequency: 5x a week OT  Frequency: 5x a week            Contractures Contractures Info: Not present    Additional Factors Info  Code Status, Allergies Code Status Info: Full Allergies Info: NKA           Current Medications (04/14/2021):  This is the current hospital active medication list Current Facility-Administered Medications  Medication Dose Route Frequency Provider Last Rate  Last Admin   0.9 %  sodium chloride infusion   Intravenous Continuous Newt Minion, MD       albuterol (PROVENTIL) (2.5 MG/3ML) 0.083% nebulizer solution 2.5 mg  2.5 mg Nebulization Q4H PRN Newt Minion, MD       alum & mag hydroxide-simeth (MAALOX/MYLANTA) 200-200-20 MG/5ML suspension 15 mL  15 mL Oral Daily Newt Minion, MD   15 mL at 04/14/21 1048   alum & mag hydroxide-simeth (MAALOX/MYLANTA) 200-200-20 MG/5ML suspension 15-30 mL  15-30 mL Oral Q2H PRN Newt Minion, MD       ascorbic acid (VITAMIN C) tablet 1,000 mg  1,000 mg Oral Daily Newt Minion, MD   1,000 mg at 04/14/21 1048   aspirin EC tablet 81 mg  81 mg Oral Daily Newt Minion, MD   81 mg at 04/14/21 1047   baclofen (LIORESAL) tablet 15 mg  15 mg Oral BID Newt Minion, MD   15 mg at 04/14/21 1047   bisacodyl (DULCOLAX) EC tablet 5 mg  5 mg Oral Daily PRN Newt Minion, MD       bisacodyl (DULCOLAX) suppository 10 mg  10 mg Rectal Q T,Th,S,Su-1800 Newt Minion, MD   10 mg at 04/13/21 1731   carvedilol (COREG) tablet 18.75 mg  18.75 mg Oral BID WC Newt Minion, MD   18.75 mg at 04/14/21 1047   cefTRIAXone (ROCEPHIN) 2 g in sodium chloride 0.9 % 100 mL IVPB  2 g Intravenous Q24H Newt Minion, MD   Stopped at 04/13/21 1828   Chlorhexidine Gluconate Cloth 2 % PADS 6 each  6 each Topical Daily Newt Minion, MD   6 each at 04/14/21 1048   Chlorhexidine Gluconate Cloth 2 % PADS 6 each  6 each Topical Q0600 Aldine Contes, MD   6 each at 04/14/21 0553   guaiFENesin-dextromethorphan (ROBITUSSIN DM) 100-10 MG/5ML syrup 15 mL  15 mL Oral Q4H PRN Newt Minion, MD       heparin injection 5,000 Units  5,000 Units Subcutaneous Q8H Newt Minion, MD   5,000 Units at 04/14/21 0548   hydrOXYzine (ATARAX/VISTARIL) tablet 50 mg  50 mg Oral TID PRN Newt Minion, MD       lactated ringers infusion   Intravenous Continuous Newt Minion, MD 10 mL/hr at 04/13/21 0706 New Bag at 04/13/21 0706   linaclotide (LINZESS) capsule 290  mcg  290 mcg Oral QAC breakfast Newt Minion, MD   290 mcg at 04/14/21 1048   lip balm (CARMEX) ointment   Topical PRN Aldine Contes, MD       melatonin tablet 3 mg  3 mg Oral QHS Newt Minion, MD   3 mg at 04/13/21 2151   mupirocin ointment (BACTROBAN) 2 % 1 application  1 application Nasal BID Aldine Contes, MD   1 application at 97/94/80 1045   nutrition supplement (JUVEN) (JUVEN) powder packet 1 packet  1 packet Oral BID BM Newt Minion, MD   1 packet at 04/14/21 1057  ondansetron (ZOFRAN) injection 4 mg  4 mg Intravenous Q6H PRN Newt Minion, MD       oxybutynin (DITROPAN-XL) 24 hr tablet 5 mg  5 mg Oral QHS Newt Minion, MD   5 mg at 04/13/21 2151   pantoprazole (PROTONIX) EC tablet 40 mg  40 mg Oral BID Newt Minion, MD   40 mg at 04/14/21 1048   phenol (CHLORASEPTIC) mouth spray 1 spray  1 spray Mouth/Throat PRN Newt Minion, MD       polyethylene glycol (MIRALAX / GLYCOLAX) packet 17 g  17 g Oral Daily Newt Minion, MD   17 g at 04/14/21 1047   polyethylene glycol (MIRALAX / GLYCOLAX) packet 17 g  17 g Oral Daily PRN Newt Minion, MD       senna-docusate (Senokot-S) tablet 2 tablet  2 tablet Oral BID Newt Minion, MD   2 tablet at 04/14/21 1047   sertraline (ZOLOFT) tablet 50 mg  50 mg Oral Daily Newt Minion, MD   50 mg at 04/14/21 1047   sucralfate (CARAFATE) 1 GM/10ML suspension 1 g  1 g Oral Q6H PRN Newt Minion, MD       tiZANidine (ZANAFLEX) tablet 4 mg  4 mg Oral TID Newt Minion, MD   4 mg at 04/14/21 1048   vancomycin (VANCOREADY) IVPB 1500 mg/300 mL  1,500 mg Intravenous Q24H Newt Minion, MD   Stopped at 04/13/21 2105   Zinc Oxide (TRIPLE PASTE) 12.8 % ointment 1 application  1 application Topical PRN Newt Minion, MD   1 application at 87/21/58 0631   zinc sulfate capsule 220 mg  220 mg Oral Daily Newt Minion, MD   220 mg at 04/14/21 1056     Discharge Medications: Please see discharge summary for a list of discharge  medications.  Relevant Imaging Results:  Relevant Lab Results:   Additional Information [id number], covid vax  Emeterio Reeve, LCSW

## 2021-04-16 ENCOUNTER — Telehealth: Payer: Self-pay | Admitting: *Deleted

## 2021-04-16 NOTE — Telephone Encounter (Signed)
Attempted to reach patient to determine if she still wishes to pursue the liver ablation procedure for her [place]. Nurse on unit asked patient and was told that she does at some point, but would like to call office to discuss. Provided office number for patient to return call.

## 2021-04-18 ENCOUNTER — Telehealth: Payer: Self-pay | Admitting: *Deleted

## 2021-04-18 NOTE — Telephone Encounter (Addendum)
Patient had returned call to office and left VM. Attempted to call her again and went to voice mail and her mailbox is full. 2nd attempt at 1426--same.

## 2021-04-21 ENCOUNTER — Encounter: Payer: Self-pay | Admitting: Orthopedic Surgery

## 2021-04-21 ENCOUNTER — Ambulatory Visit (INDEPENDENT_AMBULATORY_CARE_PROVIDER_SITE_OTHER): Payer: Medicare Other | Admitting: Orthopedic Surgery

## 2021-04-21 ENCOUNTER — Other Ambulatory Visit: Payer: Self-pay

## 2021-04-21 VITALS — Ht 65.5 in | Wt 171.0 lb

## 2021-04-21 DIAGNOSIS — Z89421 Acquired absence of other right toe(s): Secondary | ICD-10-CM

## 2021-04-21 NOTE — Telephone Encounter (Signed)
Spoke w/sister Pamala Hurry Ewings regarding if patient still wants to pursue the liver ablation procedure. She said that she does, but Dr. Jacqualyn Posey has delayed it until she heals enough from her recent amputations. Seeing ortho today and again in 2 weeks. Inquired if she still wants to see Dr. Benay Spice on 10/20 or wishes to wait till after ablation? She will check with patient and have her call back.

## 2021-04-22 ENCOUNTER — Telehealth: Payer: Self-pay

## 2021-04-22 ENCOUNTER — Telehealth: Payer: Self-pay | Admitting: *Deleted

## 2021-04-22 NOTE — Telephone Encounter (Signed)
Patient has called about 3 times since Dr. Dagoberto Ligas has been out. Patient would like to update Dr. Dagoberto Ligas. Patient recently has been in the hospital and some things have changed in her health.

## 2021-04-22 NOTE — Telephone Encounter (Signed)
Spoke w/patient regarding liver procedure delay. She reports it is being moved to 1st week in November and would prefer to wait and see Dr. Benay Spice afterwards. Will schedule F/U 3rd week in November. She requests that she be called w/appointment and not the SNF. She will arrange her transport.

## 2021-04-22 NOTE — Telephone Encounter (Signed)
Ms. Bradham called to reschedule the Microwave Ablation with Dr. Earleen Newport. Dr. Earleen Newport states "lets set her up for a clinic visit in 6 weeks after DC home, and discuss again.  you can reassure her that her tumor will be very slow growing and that the time is not a problem" Relayed message to Ms. Haldeman and she agrees.Cathren Harsh

## 2021-04-23 ENCOUNTER — Other Ambulatory Visit: Payer: Self-pay

## 2021-04-23 ENCOUNTER — Emergency Department (HOSPITAL_COMMUNITY)
Admission: EM | Admit: 2021-04-23 | Discharge: 2021-04-23 | Disposition: A | Discharge status: Home / Self Care | Payer: Medicare Other | Attending: Emergency Medicine | Admitting: Emergency Medicine

## 2021-04-23 ENCOUNTER — Encounter (HOSPITAL_COMMUNITY): Payer: Self-pay

## 2021-04-23 ENCOUNTER — Telehealth: Payer: Self-pay

## 2021-04-23 ENCOUNTER — Emergency Department (HOSPITAL_COMMUNITY): Payer: Medicare Other

## 2021-04-23 DIAGNOSIS — Z853 Personal history of malignant neoplasm of breast: Secondary | ICD-10-CM | POA: Diagnosis not present

## 2021-04-23 DIAGNOSIS — N189 Chronic kidney disease, unspecified: Secondary | ICD-10-CM | POA: Diagnosis not present

## 2021-04-23 DIAGNOSIS — Z8616 Personal history of COVID-19: Secondary | ICD-10-CM | POA: Insufficient documentation

## 2021-04-23 DIAGNOSIS — I5021 Acute systolic (congestive) heart failure: Secondary | ICD-10-CM | POA: Insufficient documentation

## 2021-04-23 DIAGNOSIS — Z8505 Personal history of malignant neoplasm of liver: Secondary | ICD-10-CM | POA: Insufficient documentation

## 2021-04-23 DIAGNOSIS — Z87891 Personal history of nicotine dependence: Secondary | ICD-10-CM | POA: Insufficient documentation

## 2021-04-23 DIAGNOSIS — I130 Hypertensive heart and chronic kidney disease with heart failure and stage 1 through stage 4 chronic kidney disease, or unspecified chronic kidney disease: Secondary | ICD-10-CM | POA: Diagnosis not present

## 2021-04-23 DIAGNOSIS — Z7982 Long term (current) use of aspirin: Secondary | ICD-10-CM | POA: Diagnosis not present

## 2021-04-23 DIAGNOSIS — S90111A Contusion of right great toe without damage to nail, initial encounter: Secondary | ICD-10-CM

## 2021-04-23 DIAGNOSIS — Z79899 Other long term (current) drug therapy: Secondary | ICD-10-CM | POA: Diagnosis not present

## 2021-04-23 DIAGNOSIS — W228XXA Striking against or struck by other objects, initial encounter: Secondary | ICD-10-CM | POA: Insufficient documentation

## 2021-04-23 DIAGNOSIS — S99921A Unspecified injury of right foot, initial encounter: Secondary | ICD-10-CM | POA: Diagnosis present
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
## 2021-04-23 NOTE — Discharge Instructions (Addendum)
X-rays were ok today and nothing broken.  The wound on the end of the right great toe needs to have bandage changed daily  and cleaned until healed.

## 2021-04-23 NOTE — Telephone Encounter (Signed)
Phe from [facility] [employer] called asking what type of dry dressing does she need to apply to the patient.  Please advise call back # [phone]

## 2021-04-23 NOTE — ED Triage Notes (Signed)
Pt BIB EMS from [place]. Pt had a toe injury 9/18, pt had surgery requiring toe amputation. Pt hit her toe today, and is wanting it checked out. 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  125/78 85 HR 100% RA

## 2021-04-24 NOTE — Telephone Encounter (Signed)
   Had amputation of 3 toes- R foot- 3rd/5th toes amputated- in hospital. For a few days. Had a wound VAC- went to Dr Jess Barters- f/u- they are talking about R AKA- says the blood flow not there. So no procedure "can be done until to save leg", per Vascular. Dr Jess Barters did surgery on toes/amputation.  Trying to save the RLE.    Went back to ER last night. Due to drainage from R foot.  Big toe had busted open- and foot slid off footplate. Lots of drainage.   Also has skin breakdown around vagina/mons- and sacrum.  New since in hospital.   Plan: Needs to determine- when vascular studies are to be done.  Speak with Dr Sharol Given in 2 weeks when stitches removed. At the latest! Counseled that whatever she does; need to make a decision- of could be NWB for months to years. F/U as scheduled.

## 2021-04-25 NOTE — Telephone Encounter (Signed)
Called the facility multiple times, no answer

## 2021-05-05 ENCOUNTER — Ambulatory Visit (INDEPENDENT_AMBULATORY_CARE_PROVIDER_SITE_OTHER): Payer: Medicare Other | Admitting: Orthopedic Surgery

## 2021-05-05 DIAGNOSIS — Z89431 Acquired absence of right foot: Secondary | ICD-10-CM

## 2021-05-06 ENCOUNTER — Encounter: Payer: Self-pay | Admitting: Orthopedic Surgery
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
## 2021-05-07 ENCOUNTER — Other Ambulatory Visit (HOSPITAL_COMMUNITY): Payer: Medicare Other

## 2021-05-07 ENCOUNTER — Ambulatory Visit: Admit: 2021-05-07 | Payer: Medicare Other | Admitting: Interventional Radiology

## 2021-05-07 SURGERY — CT WITH ANESTHESIA
Anesthesia: General

## 2021-05-15 ENCOUNTER — Encounter (HOSPITAL_BASED_OUTPATIENT_CLINIC_OR_DEPARTMENT_OTHER): Payer: Medicare Other | Attending: Internal Medicine | Admitting: Internal Medicine

## 2021-05-15 ENCOUNTER — Other Ambulatory Visit: Payer: Medicare Other

## 2021-05-15 ENCOUNTER — Ambulatory Visit: Payer: Medicare Other | Admitting: Nurse Practitioner

## 2021-05-15 ENCOUNTER — Other Ambulatory Visit: Payer: Self-pay

## 2021-05-15 DIAGNOSIS — L89313 Pressure ulcer of right buttock, stage 3: Secondary | ICD-10-CM

## 2021-05-15 DIAGNOSIS — I2510 Atherosclerotic heart disease of native coronary artery without angina pectoris: Secondary | ICD-10-CM | POA: Insufficient documentation

## 2021-05-15 DIAGNOSIS — G8220 Paraplegia, unspecified: Secondary | ICD-10-CM | POA: Diagnosis present

## 2021-05-15 DIAGNOSIS — J449 Chronic obstructive pulmonary disease, unspecified: Secondary | ICD-10-CM | POA: Insufficient documentation

## 2021-05-15 DIAGNOSIS — I509 Heart failure, unspecified: Secondary | ICD-10-CM | POA: Diagnosis not present

## 2021-05-15 DIAGNOSIS — I110 Hypertensive heart disease with heart failure: Secondary | ICD-10-CM | POA: Diagnosis not present

## 2021-05-15 NOTE — Progress Notes (Signed)
NIMRAT, WOOLWORTH ([id number]) Visit Report for 05/15/2021 Abuse/Suicide Risk Screen Details Patient Name: Date of Service: [place], [name] 05/15/2021 7:30 A M Medical Record Number: [id number] Patient Account Number: [id number] Date of Birth/Sex: Treating RN: [birth year] (70 y.o. Female) Lorrin Jackson Primary Care Bard Haupert: BLA Lucio Edward Other Clinician: Referring Astin Sayre: Treating Anthem Frazer/Extender: Yaakov Guthrie in Treatment: 0 Abuse/Suicide Risk Screen Items Answer ABUSE RISK SCREEN: Has anyone close to you tried to hurt or harm you recentlyo No Do you feel uncomfortable with anyone in your familyo No Has anyone forced you do things that you didnt want to doo No Electronic Signature(s) Signed: 05/15/2021 5:31:21 PM By: Lorrin Jackson Entered By: Lorrin Jackson on 05/15/2021 08:22:04 -------------------------------------------------------------------------------- Activities of Daily Living Details Patient Name: Date of Service: [place], [name] 05/15/2021 7:30 A M Medical Record Number: [id number] Patient Account Number: [id number] Date of Birth/Sex: Treating RN: 03/04/1951 (70 y.o. Female) Lorrin Jackson Primary Care Dee Maday: BLA Lucio Edward Other Clinician: Referring Klarisa Barman: Treating Alianny Toelle/Extender: Yaakov Guthrie in Treatment: 0 Activities of Daily Living Items Answer Activities of Daily Living (Please select one for each item) Drive Automobile Not Able T Medications ake Need Assistance Use T elephone Completely Able Care for Appearance Need Assistance Use T oilet Need Assistance Bath / Shower Need Assistance Dress Self Need Assistance Feed Self Completely Able Walk Need Assistance Get In / Out Bed Need Assistance Housework Not Able Prepare Meals Not Able Handle Money Need Assistance Shop for Self Not Able Electronic Signature(s) Signed: 05/15/2021 5:31:21 PM By: Lorrin Jackson Entered By: Lorrin Jackson on 05/15/2021  08:22:31 -------------------------------------------------------------------------------- Education Screening Details Patient Name: Date of Service: [place], [name] 05/15/2021 7:30 A M Medical Record Number: [id number] Patient Account Number: [id number] Date of Birth/Sex: Treating RN: [birth year] (70 y.o. Female) Lorrin Jackson Primary Care Alysa Duca: BLA Lucio Edward Other Clinician: Referring Orma Cheetham: Treating Courtny Bennison/Extender: Yaakov Guthrie in Treatment: 0 Primary Learner Assessed: Patient Learning Preferences/Education Level/Primary Language Learning Preference: Explanation, Demonstration, Printed Material Highest Education Level: College or Above Preferred Language: English Cognitive Barrier Language Barrier: No Translator Needed: No Memory Deficit: No Emotional Barrier: No Cultural/Religious Beliefs Affecting Medical Care: No Physical Barrier Impaired Vision: Yes Glasses Impaired Hearing: No Decreased Hand dexterity: No Knowledge/Comprehension Knowledge Level: High Comprehension Level: High Ability to understand written instructions: High Ability to understand verbal instructions: High Motivation Anxiety Level: Calm Cooperation: Cooperative Education Importance: Acknowledges Need Interest in Health Problems: Asks Questions Perception: Coherent Willingness to Engage in Self-Management High Activities: Readiness to Engage in Self-Management High Activities: Electronic Signature(s) Signed: 05/15/2021 5:31:21 PM By: Lorrin Jackson Entered By: Lorrin Jackson on 05/15/2021 08:23:02 -------------------------------------------------------------------------------- Fall Risk Assessment Details Patient Name: Date of Service: [place], [name] 05/15/2021 7:30 A M Medical Record Number: [id number] Patient Account Number: [id number] Date of Birth/Sex: Treating RN: 1951-05-04 (70 y.o. Female) Lorrin Jackson Primary Care Mariel Lukins: BLA Lucio Edward Other  Clinician: Referring Anslee Micheletti: Treating Temekia Caskey/Extender: Yaakov Guthrie in Treatment: 0 Fall Risk Assessment Items Have you had 2 or more falls in the last 12 monthso 0 No Have you had any fall that resulted in injury in the last 12 monthso 0 No FALLS RISK SCREEN History of falling - immediate or within 3 months 0 No Secondary diagnosis (Do you have 2 or more medical diagnoseso) 15 Yes Ambulatory aid None/bed rest/wheelchair/nurse 0 Yes Crutches/cane/walker 0 No Furniture 0 No Intravenous therapy Access/Saline/Heparin Lock 0 No Gait/Transferring Normal/ bed  rest/ wheelchair 0 Yes Weak (short steps with or without shuffle, stooped but able to lift head while walking, may seek 0 No support from furniture) Impaired (short steps with shuffle, may have difficulty arising from chair, head down, impaired 0 No balance) Mental Status Oriented to own ability 0 Yes Electronic Signature(s) Signed: 05/15/2021 5:31:21 PM By: Lorrin Jackson Entered By: Lorrin Jackson on 05/15/2021 08:23:21 -------------------------------------------------------------------------------- Foot Assessment Details Patient Name: Date of Service: [place], [name] 05/15/2021 7:30 A M Medical Record Number: [id number] Patient Account Number: [id number] Date of Birth/Sex: Treating RN: [birth year] (70 y.o. Female) Lorrin Jackson Primary Care Damen Windsor: BLA Lucio Edward Other Clinician: Referring Wolfgang Finigan: Treating Praveen Coia/Extender: Yaakov Guthrie in Treatment: 0 Foot Assessment Items Site Locations + = Sensation present, - = Sensation absent, C = Callus, U = Ulcer R = Redness, W = Warmth, M = Maceration, PU = Pre-ulcerative lesion F = Fissure, S = Swelling, D = Dryness Assessment Right: Left: Other Deformity: No No Prior Foot Ulcer: No No Prior Amputation: No No Charcot Joint: No No Ambulatory Status: Gait: Notes N/A: Paraplegia T wounds being treated by Dr. Nickie Retort Electronic  Signature(s) Signed: 05/15/2021 5:31:21 PM By: Lorrin Jackson Entered By: Lorrin Jackson on 05/15/2021 08:24:58 -------------------------------------------------------------------------------- Nutrition Risk Screening Details Patient Name: Date of Service: [place], [name] 05/15/2021 7:30 A M Medical Record Number: [id number] Patient Account Number: [id number] Date of Birth/Sex: Treating RN: [birth year] (70 y.o. Female) Lorrin Jackson Primary Care Renwick Asman: BLA Lucio Edward Other Clinician: Referring Dove Gresham: Treating Buck Mcaffee/Extender: Yaakov Guthrie in Treatment: 0 Height (in): Weight (lbs): Body Mass Index (BMI): Nutrition Risk Screening Items Score Screening NUTRITION RISK SCREEN: I have an illness or condition that made me change the kind and/or amount of food I eat 0 No I eat fewer than two meals per day 0 No I eat few fruits and vegetables, or milk products 0 No I have three or more drinks of beer, liquor or wine almost every day 0 No I have tooth or mouth problems that make it hard for me to eat 0 No I don't always have enough money to buy the food I need 0 No I eat alone most of the time 0 No I take three or more different prescribed or over-the-counter drugs a day 1 Yes Without wanting to, I have lost or gained 10 pounds in the last six months 0 No I am not always physically able to shop, cook and/or feed myself 0 No Nutrition Protocols Good Risk Protocol 0 No interventions needed Moderate Risk Protocol High Risk Proctocol Risk Level: Good Risk Score: 1 Electronic Signature(s) Signed: 05/15/2021 5:31:21 PM By: Lorrin Jackson Entered By: Lorrin Jackson on 05/15/2021 08:23:47

## 2021-05-15 NOTE — Progress Notes (Addendum)
[name] [name] ([id number]) Visit Report for 05/15/2021 Allergy List Details Patient Name: Date of Service: [name] [name] [name] 05/15/2021 7:30 A M Medical Record Number: [id number] Patient Account Number: [id number] Date of Birth/Sex: Treating RN: 1950/09/15 (70 y.o. [name] [name] Primary Care Tyrianna Lightle: Caprice Renshaw Other Clinician: Referring Devonda Pequignot: Treating Bryahna Lesko/Extender: Heath Lark in Treatment: 0 Allergies Active Allergies No Known Allergies Allergy Notes Electronic Signature(s) Signed: 05/15/2021 5:31:21 PM By: Lorrin Jackson Entered By: Lorrin Jackson on 05/15/2021 08:05:10 -------------------------------------------------------------------------------- Arrival Information Details Patient Name: Date of Service: [name] [address], [name] [name] 05/15/2021 7:30 A M Medical Record Number: [id number] Patient Account Number: [id number] Date of Birth/Sex: Treating RN: 1951/05/21 (70 y.o. [name] [name] Primary Care Oneill Bais: Caprice Renshaw Other Clinician: Referring Sherlin Sonier: Treating Saylor Murry/Extender: Heath Lark in Treatment: 0 Visit Information Patient Arrived: Wheel Chair Arrival Time: 07:57 Transfer Assistance: Civil Service fast streamer Patient Identification Verified: Yes Secondary Verification Process Completed: Yes Patient Requires Transmission-Based Precautions: No Patient Has Alerts: Yes Patient Alerts: Patient on Blood Thinner Electronic Signature(s) Signed: 05/15/2021 5:31:21 PM By: Lorrin Jackson Entered By: Lorrin Jackson on 05/15/2021 07:57:58 -------------------------------------------------------------------------------- Clinic Level of Care Assessment Details Patient Name: Date of Service: [name], [name] [name] 05/15/2021 7:30 A M Medical Record Number: [id number] Patient Account Number: [id number] Date of Birth/Sex: Treating RN: 04-Aug-1950 (70 y.o. [name] [name] Primary Care Trenell Concannon: Caprice Renshaw Other  Clinician: Referring Lounette Sloan: Treating Naturi Alarid/Extender: Heath Lark in Treatment: 0 Clinic Level of Care Assessment Items TOOL 2 Quantity Score X- 1 0 Use when only an EandM is performed on the INITIAL visit ASSESSMENTS - Nursing Assessment / Reassessment X- 1 20 General Physical Exam (combine w/ comprehensive assessment (listed just below) when performed on new pt. evals) X- 1 25 Comprehensive Assessment (HX, ROS, Risk Assessments, Wounds Hx, etc.) ASSESSMENTS - Wound and Skin A ssessment / Reassessment X - Simple Wound Assessment / Reassessment - one wound 1 5 []  - 0 Complex Wound Assessment / Reassessment - multiple wounds []  - 0 Dermatologic / Skin Assessment (not related to wound area) ASSESSMENTS - Ostomy and/or Continence Assessment and Care []  - 0 Incontinence Assessment and Management []  - 0 Ostomy Care Assessment and Management (repouching, etc.) PROCESS - Coordination of Care []  - 0 Simple Patient / Family Education for ongoing care X- 1 20 Complex (extensive) Patient / Family Education for ongoing care X- 1 10 Staff obtains Programmer, systems, Records, T Results / Process Orders est X- 1 10 Staff telephones HHA, Nursing Homes / Clarify orders / etc []  - 0 Routine Transfer to another Facility (non-emergent condition) []  - 0 Routine Hospital Admission (non-emergent condition) []  - 0 New Admissions / Biomedical engineer / Ordering NPWT Apligraf, etc. , []  - 0 Emergency Hospital Admission (emergent condition) []  - 0 Simple Discharge Coordination []  - 0 Complex (extensive) Discharge Coordination PROCESS - Special Needs []  - 0 Pediatric / Minor Patient Management []  - 0 Isolation Patient Management []  - 0 Hearing / Language / Visual special needs []  - 0 Assessment of Community assistance (transportation, D/C planning, etc.) []  - 0 Additional assistance / Altered mentation []  - 0 Support Surface(s) Assessment (bed, cushion, seat,  etc.) INTERVENTIONS - Wound Cleansing / Measurement X- 1 5 Wound Imaging (photographs - any number of wounds) []  - 0 Wound Tracing (instead of photographs) X- 1 5 Simple Wound Measurement - one wound []  - 0 Complex Wound Measurement - multiple wounds []  - 0  Simple Wound Cleansing - one wound []  - 0 Complex Wound Cleansing - multiple wounds INTERVENTIONS - Wound Dressings []  - 0 Small Wound Dressing one or multiple wounds X- 1 15 Medium Wound Dressing one or multiple wounds []  - 0 Large Wound Dressing one or multiple wounds []  - 0 Application of Medications - injection INTERVENTIONS - Miscellaneous []  - 0 External ear exam []  - 0 Specimen Collection (cultures, biopsies, blood, body fluids, etc.) []  - 0 Specimen(s) / Culture(s) sent or taken to Lab for analysis []  - 0 Patient Transfer (multiple staff / Civil Service fast streamer / Similar devices) []  - 0 Simple Staple / Suture removal (25 or less) []  - 0 Complex Staple / Suture removal (26 or more) []  - 0 Hypo / Hyperglycemic Management (close monitor of Blood Glucose) []  - 0 Ankle / Brachial Index (ABI) - do not check if billed separately Has the patient been seen at the hospital within the last three years: Yes Total Score: 115 Level Of Care: New/Established - Level 3 Electronic Signature(s) Signed: 05/15/2021 5:31:21 PM By: Lorrin Jackson Entered By: Lorrin Jackson on 05/15/2021 08:54:59 -------------------------------------------------------------------------------- Encounter Discharge Information Details Patient Name: Date of Service: [name] RD, [name] [name] 05/15/2021 7:30 A M Medical Record Number: [id number] Patient Account Number: [id number] Date of Birth/Sex: Treating RN: [birth year] (70 y.o. [name] [name] Primary Care Terrelle Ruffolo: Caprice Renshaw Other Clinician: Referring Bailie Christenbury: Treating Shamiyah Ngu/Extender: Heath Lark in Treatment: 0 Encounter Discharge Information Items Discharge Condition:  Stable Ambulatory Status: Wheelchair Discharge Destination: [place] Orders Sent: Yes Transportation: Other Schedule Follow-up Appointment: Yes Clinical Summary of Care: Provided on 05/15/2021 Form Type Recipient Paper Patient Patient Electronic Signature(s) Signed: 05/15/2021 10:57:40 AM By: Lorrin Jackson Entered By: Lorrin Jackson on 05/15/2021 10:57:40 -------------------------------------------------------------------------------- Lower Extremity Assessment Details Patient Name: Date of Service: [facility] RD, [name] [name] 05/15/2021 7:30 A M Medical Record Number: [id number] Patient Account Number: [id number] Date of Birth/Sex: Treating RN: 01/17/1951 (70 y.o. [name] [name] Primary Care Calissa Swenor: Caprice Renshaw Other Clinician: Referring Dawsyn Ramsaran: Treating Sana Tessmer/Extender: Heath Lark in Treatment: 0 Electronic Signature(s) Signed: 05/15/2021 5:31:21 PM By: Lorrin Jackson Entered By: Lorrin Jackson on 05/15/2021 08:24:22 -------------------------------------------------------------------------------- Multi Wound Chart Details Patient Name: Date of Service: [place], [name] [name] 05/15/2021 7:30 A M Medical Record Number: [id number] Patient Account Number: [id number] Date of Birth/Sex: Treating RN: 1950/11/01 (70 y.o. [name] [name], [name] [name] Primary Care Talajah Slimp: Caprice Renshaw Other Clinician: Referring Zeanna Sunde: Treating Breon Diss/Extender: Heath Lark in Treatment: 0 Vital Signs Height(in): Pulse(bpm): 1 Weight(lbs): Blood Pressure(mmHg): 121/81 Body Mass Index(BMI): Temperature(F): 98.4 Respiratory Rate(breaths/min): 18 Photos: [N/A:N/A] Right Gluteus N/A N/A Wound Location: Pressure Injury N/A N/A Wounding Event: Pressure Ulcer N/A N/A Primary Etiology: Anemia, Chronic Obstructive N/A N/A Comorbid History: Pulmonary Disease (COPD), Congestive Heart Failure, Coronary Artery Disease,  Hypertension, Rheumatoid Arthritis, Paraplegia, Received Chemotherapy 04/14/2021 N/A N/A Date Acquired: 0 N/A N/A Weeks of Treatment: Open N/A N/A Wound Status: 0.6x0.5x0.1 N/A N/A Measurements L x W x D (cm) 0.236 N/A N/A A (cm) : rea 0.024 N/A N/A Volume (cm) : 0.00% N/A N/A % Reduction in A rea: 0.00% N/A N/A % Reduction in Volume: Category/Stage III N/A N/A Classification: Medium N/A N/A Exudate A mount: Serosanguineous N/A N/A Exudate Type: red, brown N/A N/A Exudate Color: Distinct, outline attached N/A N/A Wound Margin: Large (67-100%) N/A N/A Granulation A mount: Red, Pink N/A N/A Granulation Quality: Small (1-33%) N/A N/A Necrotic A mount: Fat  Layer (Subcutaneous Tissue): Yes N/A N/A Exposed Structures: Fascia: No Tendon: No Muscle: No Joint: No Bone: No None N/A N/A Epithelialization: Treatment Notes Wound #1 (Gluteus) Wound Laterality: Right Cleanser Soap and Water Discharge Instruction: Wash and pat dry Peri-Wound Care Topical Primary Dressing Hydrofera Blue Ready Foam, 4x5 in Discharge Instruction: Apply to wound bed as instructed Santyl Ointment Discharge Instruction: *Applied in our office only* Secondary Dressing Zetuvit Plus Silicone Border Dressing 4x4 (in/in) Discharge Instruction: Or equivalent foam border dressing Secured With Compression Wrap Compression Stockings Add-Ons Electronic Signature(s) Signed: 05/21/2021 9:13:29 AM By: Kalman Shan DO Signed: 08/14/2021 12:39:04 PM By: Rhae Hammock RN Entered By: Kalman Shan on 05/21/2021 09:06:05 -------------------------------------------------------------------------------- [place] Details Patient Name: Date of Service: [place], [name] [name] 05/15/2021 7:30 A M Medical Record Number: [id number] Patient Account Number: [id number] Date of Birth/Sex: Treating RN: 11-Dec-1950 (70 y.o. [name] [name] Primary Care Abhay Godbolt: Caprice Renshaw Other  Clinician: Referring Madisin Hasan: Treating Zakariyah Freimark/Extender: Heath Lark in Treatment: 0 Active Inactive Pressure Nursing Diagnoses: Knowledge deficit related to management of pressures ulcers Goals: Patient/caregiver will verbalize understanding of pressure ulcer management Date Initiated: 05/15/2021 Target Resolution Date: 06/19/2021 Goal Status: Active Interventions: Assess: immobility, friction, shearing, incontinence upon admission and as needed Assess offloading mechanisms upon admission and as needed Assess potential for pressure ulcer upon admission and as needed Provide education on pressure ulcers Notes: Wound/Skin Impairment Nursing Diagnoses: Impaired tissue integrity Goals: Patient/caregiver will verbalize understanding of skin care regimen Date Initiated: 05/15/2021 Target Resolution Date: 06/19/2021 Goal Status: Active Ulcer/skin breakdown will have a volume reduction of 30% by week 4 Date Initiated: 05/15/2021 Target Resolution Date: 06/19/2021 Goal Status: Active Interventions: Assess patient/caregiver ability to obtain necessary supplies Assess patient/caregiver ability to perform ulcer/skin care regimen upon admission and as needed Assess ulceration(s) every visit Provide education on ulcer and skin care Treatment Activities: Skin care regimen initiated : 05/15/2021 Topical wound management initiated : 05/15/2021 Notes: Electronic Signature(s) Signed: 05/15/2021 5:31:21 PM By: Lorrin Jackson Entered By: Lorrin Jackson on 05/15/2021 08:46:38 -------------------------------------------------------------------------------- Pain Assessment Details Patient Name: Date of Service: [place], [name] [name] 05/15/2021 7:30 A M Medical Record Number: [id number] Patient Account Number: [id number] Date of Birth/Sex: Treating RN: Sep 17, 1950 (70 y.o. [name] [name] Primary Care Jaleyah Longhi: Caprice Renshaw Other Clinician: Referring  Secret Kristensen: Treating Draedyn Weidinger/Extender: Heath Lark in Treatment: 0 Active Problems Location of Pain Severity and Description of Pain Patient Has Paino No Site Locations Pain Management and Medication Current Pain Management: Electronic Signature(s) Signed: 05/15/2021 5:31:21 PM By: Lorrin Jackson Entered By: Lorrin Jackson on 05/15/2021 08:25:07 -------------------------------------------------------------------------------- Patient/Caregiver Education Details Patient Name: Date of Service: [name], [name] [name] 10/20/2022andnbsp7:30 A M Medical Record Number: [id number] Patient Account Number: [id number] Date of Birth/Gender: Treating RN: Dec 30, 1950 (70 y.o. [name] [name] Primary Care Physician: Caprice Renshaw Other Clinician: Referring Physician: Treating Physician/Extender: Heath Lark in Treatment: 0 Education Assessment Education Provided To: Patient and Caregiver Education Topics Provided Pressure: Methods: Explain/Verbal, Printed Responses: State content correctly Wound/Skin Impairment: Methods: Explain/Verbal, Printed Responses: State content correctly Electronic Signature(s) Signed: 05/15/2021 5:31:21 PM By: Lorrin Jackson Entered By: Lorrin Jackson on 05/15/2021 08:46:54 -------------------------------------------------------------------------------- Wound Assessment Details Patient Name: Date of Service: [place], [name] [name] 05/15/2021 7:30 A M Medical Record Number: [id number] Patient Account Number: [id number] Date of Birth/Sex: Treating RN: 04/14/1951 (70 y.o. [name] [name] Primary Care Gianny Killman: Caprice Renshaw Other Clinician: Referring Adarian Bur: Treating Ryszard Socarras/Extender: Heber Bonifay  Einar Grad, Fara Olden Weeks in Treatment: 0 Wound Status Wound Number: 1 Primary Pressure Ulcer Etiology: Wound Location: Right Gluteus Wound Open Wounding Event: Pressure Injury Status: Date Acquired:  04/14/2021 Comorbid Anemia, Chronic Obstructive Pulmonary Disease (COPD), Weeks Of Treatment: 0 History: Congestive Heart Failure, Coronary Artery Disease, Hypertension, Clustered Wound: No Rheumatoid Arthritis, Paraplegia, Received Chemotherapy Photos Wound Measurements Length: (cm) 0.6 Width: (cm) 0.5 Depth: (cm) 0.1 Area: (cm) 0.236 Volume: (cm) 0.024 % Reduction in Area: 0% % Reduction in Volume: 0% Epithelialization: None Tunneling: No Undermining: No Wound Description Classification: Category/Stage III Wound Margin: Distinct, outline attached Exudate Amount: Medium Exudate Type: Serosanguineous Exudate Color: red, brown Foul Odor After Cleansing: No Slough/Fibrino Yes Wound Bed Granulation Amount: Large (67-100%) Exposed Structure Granulation Quality: Red, Pink Fascia Exposed: No Necrotic Amount: Small (1-33%) Fat Layer (Subcutaneous Tissue) Exposed: Yes Necrotic Quality: Adherent Slough Tendon Exposed: No Muscle Exposed: No Joint Exposed: No Bone Exposed: No Electronic Signature(s) Signed: 05/15/2021 8:42:45 AM By: Lorrin Jackson Entered By: Lorrin Jackson on 05/15/2021 08:42:45 -------------------------------------------------------------------------------- Vitals Details Patient Name: Date of Service: [name], [name] [name] 05/15/2021 7:30 A M Medical Record Number: [id number] Patient Account Number: [id number] Date of Birth/Sex: Treating RN: January 01, 1951 (70 y.o. [name] [name] Primary Care Jin Shockley: Caprice Renshaw Other Clinician: Referring Shanera Meske: Treating Bracen Schum/Extender: Heath Lark in Treatment: 0 Vital Signs Time Taken: 07:58 Temperature (F): 98.4 Pulse (bpm): 77 Respiratory Rate (breaths/min): 18 Blood Pressure (mmHg): 121/81 Reference Range: 80 - 120 mg / dl Electronic Signature(s) Signed: 05/15/2021 5:31:21 PM By: Lorrin Jackson Entered By: Lorrin Jackson on 05/15/2021 07:58:27

## 2021-05-16 ENCOUNTER — Encounter: Payer: Medicare Other | Admitting: Physical Medicine and Rehabilitation

## 2021-05-20 ENCOUNTER — Other Ambulatory Visit (HOSPITAL_COMMUNITY): Payer: Self-pay | Admitting: Interventional Radiology

## 2021-05-20 ENCOUNTER — Encounter: Payer: Self-pay | Admitting: Orthopedic Surgery

## 2021-05-20 DIAGNOSIS — C220 Liver cell carcinoma: Secondary | ICD-10-CM

## 2021-05-20 DIAGNOSIS — K219 Gastro-esophageal reflux disease without esophagitis: Secondary | ICD-10-CM | POA: Insufficient documentation
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
## 2021-05-26 ENCOUNTER — Ambulatory Visit: Payer: Medicare Other | Admitting: Orthopedic Surgery

## 2021-05-29 ENCOUNTER — Ambulatory Visit
Admission: RE | Admit: 2021-05-29 | Discharge: 2021-05-29 | Disposition: A | Discharge status: Home / Self Care | Payer: Medicare Other | Source: Ambulatory Visit | Attending: Interventional Radiology | Admitting: Interventional Radiology

## 2021-05-29 ENCOUNTER — Other Ambulatory Visit: Payer: Self-pay

## 2021-05-29 ENCOUNTER — Encounter: Payer: Self-pay | Admitting: *Deleted

## 2021-05-29 ENCOUNTER — Encounter (HOSPITAL_BASED_OUTPATIENT_CLINIC_OR_DEPARTMENT_OTHER): Payer: Medicare Other | Attending: Internal Medicine | Admitting: Internal Medicine

## 2021-05-29 DIAGNOSIS — G8220 Paraplegia, unspecified: Secondary | ICD-10-CM | POA: Diagnosis not present

## 2021-05-29 DIAGNOSIS — L89313 Pressure ulcer of right buttock, stage 3: Secondary | ICD-10-CM | POA: Diagnosis not present

## 2021-05-29 DIAGNOSIS — L89152 Pressure ulcer of sacral region, stage 2: Secondary | ICD-10-CM | POA: Insufficient documentation

## 2021-05-29 DIAGNOSIS — I509 Heart failure, unspecified: Secondary | ICD-10-CM | POA: Diagnosis not present

## 2021-05-29 DIAGNOSIS — C220 Liver cell carcinoma: Secondary | ICD-10-CM

## 2021-05-29 DIAGNOSIS — I110 Hypertensive heart disease with heart failure: Secondary | ICD-10-CM | POA: Diagnosis not present

## 2021-05-29 HISTORY — PX: IR RADIOLOGIST EVAL & MGMT: IMG5224
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
## 2021-06-03 NOTE — Progress Notes (Signed)
AMELIE, CARACCI ([id number]) Visit Report for 05/29/2021 Arrival Information Details Patient Name: Date of Service: [name], [name] 05/29/2021 3:30 PM Medical Record Number: [id number] Patient Account Number: [id number] Date of Birth/Sex: Treating RN: [birth year] (70 y.o. [name], [name] Primary Care Zalma Channing: Caprice Renshaw Other Clinician: Referring Quintus Premo: Treating Ashritha Desrosiers/Extender: Heath Lark in Treatment: 2 Visit Information History Since Last Visit Added or deleted any medications: No Patient Arrived: Wheel Chair Any new allergies or adverse reactions: No Arrival Time: 16:46 Had a fall or experienced change in No Accompanied By: self activities of daily living that may affect Transfer Assistance: Harrel Lemon Lift risk of falls: Patient Requires Transmission-Based Precautions: No Signs or symptoms of abuse/neglect since last visito No Patient Has Alerts: Yes Hospitalized since last visit: No Patient Alerts: Patient on Blood Thinner Implantable device outside of the clinic excluding No cellular tissue based products placed in the center since last visit: Pain Present Now: No Electronic Signature(s) Signed: 05/29/2021 5:54:47 PM By: Dellie Catholic RN Entered By: Dellie Catholic on 05/29/2021 16:46:54 -------------------------------------------------------------------------------- Clinic Level of Care Assessment Details Patient Name: Date of Service: [name], [name] 05/29/2021 3:30 PM Medical Record Number: [id number] Patient Account Number: [id number] Date of Birth/Sex: Treating RN: [birth year] (70 y.o. [name], [name] Primary Care Callie Bunyard: Caprice Renshaw Other Clinician: Referring Patricia Fargo: Treating Carmelite Violet/Extender: Heath Lark in Treatment: 2 Clinic Level of Care Assessment Items TOOL 4 Quantity Score X- 1 0 Use when only an EandM is performed on FOLLOW-UP visit ASSESSMENTS - Nursing Assessment /  Reassessment X- 1 10 Reassessment of Co-morbidities (includes updates in patient status) X- 1 5 Reassessment of Adherence to Treatment Plan ASSESSMENTS - Wound and Skin A ssessment / Reassessment X - Simple Wound Assessment / Reassessment - one wound 1 5 []  - 0 Complex Wound Assessment / Reassessment - multiple wounds []  - 0 Dermatologic / Skin Assessment (not related to wound area) ASSESSMENTS - Focused Assessment []  - 0 Circumferential Edema Measurements - multi extremities []  - 0 Nutritional Assessment / Counseling / Intervention []  - 0 Lower Extremity Assessment (monofilament, tuning fork, pulses) []  - 0 Peripheral Arterial Disease Assessment (using hand held doppler) ASSESSMENTS - Ostomy and/or Continence Assessment and Care []  - 0 Incontinence Assessment and Management []  - 0 Ostomy Care Assessment and Management (repouching, etc.) PROCESS - Coordination of Care X - Simple Patient / Family Education for ongoing care 1 15 []  - 0 Complex (extensive) Patient / Family Education for ongoing care X- 1 10 Staff obtains Programmer, systems, Records, T Results / Process Orders est X- 1 10 Staff telephones HHA, Nursing Homes / Clarify orders / etc []  - 0 Routine Transfer to another Facility (non-emergent condition) []  - 0 Routine Hospital Admission (non-emergent condition) []  - 0 New Admissions / Biomedical engineer / Ordering NPWT Apligraf, etc. , []  - 0 Emergency Hospital Admission (emergent condition) X- 1 10 Simple Discharge Coordination []  - 0 Complex (extensive) Discharge Coordination PROCESS - Special Needs []  - 0 Pediatric / Minor Patient Management []  - 0 Isolation Patient Management []  - 0 Hearing / Language / Visual special needs []  - 0 Assessment of Community assistance (transportation, D/C planning, etc.) []  - 0 Additional assistance / Altered mentation []  - 0 Support Surface(s) Assessment (bed, cushion, seat, etc.) INTERVENTIONS - Wound Cleansing /  Measurement X - Simple Wound Cleansing - one wound 1 5 []  - 0 Complex Wound Cleansing - multiple wounds X- 1 5 Wound Imaging (  photographs - any number of wounds) []  - 0 Wound Tracing (instead of photographs) X- 1 5 Simple Wound Measurement - one wound []  - 0 Complex Wound Measurement - multiple wounds INTERVENTIONS - Wound Dressings X - Small Wound Dressing one or multiple wounds 1 10 []  - 0 Medium Wound Dressing one or multiple wounds []  - 0 Large Wound Dressing one or multiple wounds []  - 0 Application of Medications - topical []  - 0 Application of Medications - injection INTERVENTIONS - Miscellaneous []  - 0 External ear exam []  - 0 Specimen Collection (cultures, biopsies, blood, body fluids, etc.) []  - 0 Specimen(s) / Culture(s) sent or taken to Lab for analysis X- 1 10 Patient Transfer (multiple staff / Harrel Lemon Lift / Similar devices) []  - 0 Simple Staple / Suture removal (25 or less) []  - 0 Complex Staple / Suture removal (26 or more) []  - 0 Hypo / Hyperglycemic Management (close monitor of Blood Glucose) []  - 0 Ankle / Brachial Index (ABI) - do not check if billed separately X- 1 5 Vital Signs Has the patient been seen at the hospital within the last three years: Yes Total Score: 105 Level Of Care: New/Established - Level 3 Electronic Signature(s) Signed: 06/03/2021 4:45:49 PM By: Rhae Hammock RN Entered By: Rhae Hammock on 05/29/2021 17:08:36 -------------------------------------------------------------------------------- Encounter Discharge Information Details Patient Name: Date of Service: [name], [name] 05/29/2021 3:30 PM Medical Record Number: [id number] Patient Account Number: [id number] Date of Birth/Sex: Treating RN: 1951-04-02 (70 y.o. [name], [name] Primary Care Naomia Lenderman: Caprice Renshaw Other Clinician: Referring Arabel Barcenas: Treating Marckus Hanover/Extender: Heath Lark in Treatment: 2 Encounter Discharge  Information Items Discharge Condition: Stable Ambulatory Status: Wheelchair Discharge Destination: Skilled Nursing Facility Telephoned: No Orders Sent: Yes Transportation: Private Auto Accompanied By: self Schedule Follow-up Appointment: Yes Clinical Summary of Care: Patient Declined Electronic Signature(s) Signed: 06/03/2021 4:45:49 PM By: Rhae Hammock RN Entered By: Rhae Hammock on 05/29/2021 17:10:00 -------------------------------------------------------------------------------- Multi Wound Chart Details Patient Name: Date of Service: [name], [name] 05/29/2021 3:30 PM Medical Record Number: [id number] Patient Account Number: [id number] Date of Birth/Sex: Treating RN: [birth year] (70 y.o. [name], [name] Primary Care Demri Poulton: Caprice Renshaw Other Clinician: Referring Esparanza Krider: Treating Jaydenn Boccio/Extender: Heath Lark in Treatment: 2 Vital Signs Height(in): Pulse(bpm): 33 Weight(lbs): Blood Pressure(mmHg): 98/64 Body Mass Index(BMI): Temperature(F): 98.5 Respiratory Rate(breaths/min): 17 Photos: [1:No Photos Right Gluteus] [2:No Photos Sacrum] [N/A:N/A N/A] Wound Location: [1:Pressure Injury] [2:Pressure Injury] [N/A:N/A] Wounding Event: [1:Pressure Ulcer] [2:Pressure Ulcer] [N/A:N/A] Primary Etiology: [1:Anemia, Chronic Obstructive] [2:Anemia, Chronic Obstructive] [N/A:N/A] Comorbid History: [1:Pulmonary Disease (COPD), Congestive Heart Failure, Coronary Artery Disease, Hypertension, Rheumatoid Arthritis, Paraplegia, Received Chemotherapy 04/14/2021] [2:Pulmonary Disease (COPD), Congestive Heart Failure, Coronary Artery  Disease, Hypertension, Rheumatoid Arthritis, Paraplegia, Received Chemotherapy 05/27/2021] [N/A:N/A] Date Acquired: [1:2] [2:0] [N/A:N/A] Weeks of Treatment: [1:Healed - Epithelialized] [2:Open] [N/A:N/A] Wound Status: [1:0x0x0] [2:0.4x0.4x0.5] [N/A:N/A] Measurements L x W x D (cm) [1:0] [2:0.126] [N/A:N/A] A (cm)  : rea [1:0] [2:0.063] [N/A:N/A] Volume (cm) : [1:100.00%] [2:N/A] [N/A:N/A] % Reduction in A rea: [1:100.00%] [2:N/A] [N/A:N/A] % Reduction in Volume: [1:Category/Stage III] [2:Category/Stage II] [N/A:N/A] Classification: [1:None Present] [2:Medium] [N/A:N/A] Exudate A [name]: [1:N/A] [2:Serosanguineous] [N/A:N/A] Exudate Type: [1:N/A] [2:red, brown] [N/A:N/A] Exudate Color: [1:Distinct, outline attached] [2:Flat and Intact] [N/A:N/A] Wound Margin: [1:None Present (0%)] [2:Large (67-100%)] [N/A:N/A] Granulation A [name]: [1:N/A] [2:Pink] [N/A:N/A] Granulation Quality: [1:None Present (0%)] [2:None Present (0%)] [N/A:N/A] Necrotic A [name]: [1:Fascia: No] [2:Fat Layer (Subcutaneous Tissue): Yes N/A] Exposed Structures: [1:Fat  Layer (Subcutaneous Tissue): No Tendon: No Muscle: No Joint: No Bone: No None] [2:Fascia: No Tendon: No Muscle: No Joint: No Bone: No None] [N/A:N/A] Treatment Notes Wound #2 (Sacrum) Cleanser Wound Cleanser Discharge Instruction: Cleanse the wound with wound cleanser prior to applying a clean dressing using gauze sponges, not tissue or cotton balls. Peri-Wound Care Topical Primary Dressing Hydrofera Blue Classic Foam, 2x2 in Discharge Instruction: Moisten with saline prior to applying to wound bed Secondary Dressing Woven Gauze Sponge, Non-Sterile 4x4 in Discharge Instruction: Apply over primary dressing as directed. Zetuvit Plus Silicone Border Dressing 4x4 (in/in) Discharge Instruction: Apply silicone border over primary dressing as directed. Secured With Compression Wrap Compression Stockings Environmental education officer) Signed: 05/30/2021 2:44:39 PM By: Kalman Shan DO Signed: 06/03/2021 4:45:49 PM By: Rhae Hammock RN Entered By: Kalman Shan on 05/30/2021 14:35:32 -------------------------------------------------------------------------------- Multi-Disciplinary Care Plan Details Patient Name: Date of Service: [name], [name]  05/29/2021 3:30 PM Medical Record Number: [id number] Patient Account Number: [id number] Date of Birth/Sex: Treating RN: Dec 23, 1950 (70 y.o. [name], [name] Primary Care Joshau Code: Caprice Renshaw Other Clinician: Referring Dayanna Pryce: Treating Alysse Rathe/Extender: Heath Lark in Treatment: 2 Active Inactive Pressure Nursing Diagnoses: Knowledge deficit related to management of pressures ulcers Goals: Patient/caregiver will verbalize understanding of pressure ulcer management Date Initiated: 05/15/2021 Target Resolution Date: 06/19/2021 Goal Status: Active Interventions: Assess: immobility, friction, shearing, incontinence upon admission and as needed Assess offloading mechanisms upon admission and as needed Assess potential for pressure ulcer upon admission and as needed Provide education on pressure ulcers Notes: Wound/Skin Impairment Nursing Diagnoses: Impaired tissue integrity Goals: Patient/caregiver will verbalize understanding of skin care regimen Date Initiated: 05/15/2021 Target Resolution Date: 06/19/2021 Goal Status: Active Ulcer/skin breakdown will have a volume reduction of 30% by week 4 Date Initiated: 05/15/2021 Target Resolution Date: 06/19/2021 Goal Status: Active Interventions: Assess patient/caregiver ability to obtain necessary supplies Assess patient/caregiver ability to perform ulcer/skin care regimen upon admission and as needed Assess ulceration(s) every visit Provide education on ulcer and skin care Treatment Activities: Skin care regimen initiated : 05/15/2021 Topical wound management initiated : 05/15/2021 Notes: Electronic Signature(s) Signed: 06/03/2021 4:45:49 PM By: Rhae Hammock RN Entered By: Rhae Hammock on 05/29/2021 17:07:21 -------------------------------------------------------------------------------- Pain Assessment Details Patient Name: Date of Service: [name], [name] 05/29/2021 3:30  PM Medical Record Number: [id number] Patient Account Number: [id number] Date of Birth/Sex: Treating RN: 06-15-1951 (70 y.o. [name], [name] Primary Care Vienna Folden: Caprice Renshaw Other Clinician: Referring Essie Gehret: Treating Nomi Rudnicki/Extender: Heath Lark in Treatment: 2 Active Problems Location of Pain Severity and Description of Pain Patient Has Paino No Site Locations Rate the pain. Rate the pain. Current Pain Level: 0 Pain Management and Medication Current Pain Management: Medication: No Cold Application: No Rest: No Massage: No Activity: No T.E.N.S.: No Heat Application: No Leg drop or elevation: No Is the Current Pain Management Adequate: Adequate How does your wound impact your activities of daily livingo Sleep: No Bathing: No Appetite: No Relationship With Others: No Bladder Continence: No Emotions: No Bowel Continence: No Work: No Toileting: No Drive: No Dressing: No Hobbies: No Electronic Signature(s) Signed: 05/29/2021 5:54:47 PM By: Dellie Catholic RN Signed: 06/03/2021 4:45:49 PM By: Rhae Hammock RN Entered By: Dellie Catholic on 05/29/2021 16:47:53 -------------------------------------------------------------------------------- Patient/Caregiver Education Details Patient Name: Date of Service: [name], [name] 11/3/2022andnbsp3:30 PM Medical Record Number: [id number] Patient Account Number: [id number] Date of Birth/Gender: Treating RN: [birth year] (70 y.o. [name] Primary Care Physician: Judi Cong,  Fara Olden Other Clinician: Referring Physician: Treating Physician/Extender: Heath Lark in Treatment: 2 Education Assessment Education Provided To: Patient Education Topics Provided Pressure: Methods: Explain/Verbal Responses: State content correctly Electronic Signature(s) Signed: 06/03/2021 4:45:49 PM By: Rhae Hammock RN Entered By: Rhae Hammock on 05/29/2021  17:07:55 -------------------------------------------------------------------------------- Wound Assessment Details Patient Name: Date of Service: [name], [name] 05/29/2021 3:30 PM Medical Record Number: [id number] Patient Account Number: [id number] Date of Birth/Sex: Treating RN: 31-Aug-1950 (70 y.o. [name], [name] Primary Care Rulon Abdalla: Caprice Renshaw Other Clinician: Referring Susen Haskew: Treating Jernie Schutt/Extender: Heath Lark in Treatment: 2 Wound Status Wound Number: 1 Primary Pressure Ulcer Etiology: Wound Location: Right Gluteus Wound Healed - Epithelialized Wounding Event: Pressure Injury Status: Date Acquired: 04/14/2021 Comorbid Anemia, Chronic Obstructive Pulmonary Disease (COPD), Weeks Of Treatment: 2 History: Congestive Heart Failure, Coronary Artery Disease, Hypertension, Clustered Wound: No Rheumatoid Arthritis, Paraplegia, Received Chemotherapy Wound Measurements Length: (cm) Width: (cm) Depth: (cm) Area: (cm) Volume: (cm) 0 % Reduction in Area: 100% 0 % Reduction in Volume: 100% 0 Epithelialization: None 0 0 Wound Description Classification: Category/Stage III Wound Margin: Distinct, outline attached Exudate Amount: None Present Foul Odor After Cleansing: No Slough/Fibrino No Wound Bed Granulation Amount: None Present (0%) Exposed Structure Necrotic Amount: None Present (0%) Fascia Exposed: No Fat Layer (Subcutaneous Tissue) Exposed: No Tendon Exposed: No Muscle Exposed: No Joint Exposed: No Bone Exposed: No Electronic Signature(s) Signed: 05/29/2021 5:54:47 PM By: Dellie Catholic RN Signed: 06/03/2021 4:45:49 PM By: Rhae Hammock RN Entered By: Dellie Catholic on 05/29/2021 16:55:18 -------------------------------------------------------------------------------- Wound Assessment Details Patient Name: Date of Service: [name], [name] 05/29/2021 3:30 PM Medical Record Number: [id number] Patient Account  Number: [id number] Date of Birth/Sex: Treating RN: 1950-11-12 (70 y.o. [name], [name] Primary Care Genette Huertas: Caprice Renshaw Other Clinician: Referring Johnhenry Tippin: Treating Myrick Mcnairy/Extender: Heath Lark in Treatment: 2 Wound Status Wound Number: 2 Primary Pressure Ulcer Etiology: Wound Location: Sacrum Wound Open Wounding Event: Pressure Injury Status: Date Acquired: 05/27/2021 Comorbid Anemia, Chronic Obstructive Pulmonary Disease (COPD), Weeks Of Treatment: 0 History: Congestive Heart Failure, Coronary Artery Disease, Hypertension, Clustered Wound: No Clustered Wound: No Rheumatoid Arthritis, Paraplegia, Received Chemotherapy Wound Measurements Length: (cm) 0.4 Width: (cm) 0.4 Depth: (cm) 0.5 Area: (cm) 0.126 Volume: (cm) 0.063 % Reduction in Area: % Reduction in Volume: Epithelialization: None Tunneling: No Undermining: No Wound Description Classification: Category/Stage II Wound Margin: Flat and Intact Exudate Amount: Medium Exudate Type: Serosanguineous Exudate Color: red, brown Foul Odor After Cleansing: No Slough/Fibrino No Wound Bed Granulation Amount: Large (67-100%) Exposed Structure Granulation Quality: Pink Fascia Exposed: No Necrotic Amount: None Present (0%) Fat Layer (Subcutaneous Tissue) Exposed: Yes Tendon Exposed: No Muscle Exposed: No Joint Exposed: No Bone Exposed: No Treatment Notes Wound #2 (Sacrum) Cleanser Wound Cleanser Discharge Instruction: Cleanse the wound with wound cleanser prior to applying a clean dressing using gauze sponges, not tissue or cotton balls. Peri-Wound Care Topical Primary Dressing Hydrofera Blue Classic Foam, 2x2 in Discharge Instruction: Moisten with saline prior to applying to wound bed Secondary Dressing Woven Gauze Sponge, Non-Sterile 4x4 in Discharge Instruction: Apply over primary dressing as directed. Zetuvit Plus Silicone Border Dressing 4x4 (in/in) Discharge Instruction:  Apply silicone border over primary dressing as directed. Secured With Compression Wrap Compression Stockings Environmental education officer) Signed: 05/29/2021 5:54:47 PM By: Dellie Catholic RN Signed: 06/03/2021 4:45:49 PM By: Rhae Hammock RN Entered By: Dellie Catholic on 05/29/2021 16:58:02 -------------------------------------------------------------------------------- Vitals Details Patient Name: Date of Service: [name],  [name] 05/29/2021 3:30 PM Medical Record Number: [id number] Patient Account Number: [id number] Date of Birth/Sex: Treating RN: 01/04/1951 (70 y.o. [name], [name] Primary Care Walker Paddack: Caprice Renshaw Other Clinician: Referring Glenda Spelman: Treating Wilhelmine Krogstad/Extender: Heath Lark in Treatment: 2 Vital Signs Time Taken: 16:46 Temperature (F): 98.5 Pulse (bpm): 85 Respiratory Rate (breaths/min): 17 Blood Pressure (mmHg): 98/64 Reference Range: 80 - 120 mg / dl Electronic Signature(s) Signed: 05/29/2021 5:54:47 PM By: Dellie Catholic RN Entered By: Dellie Catholic on 05/29/2021 16:47:28

## 2021-06-05 ENCOUNTER — Encounter (HOSPITAL_BASED_OUTPATIENT_CLINIC_OR_DEPARTMENT_OTHER): Payer: Medicare Other | Admitting: Internal Medicine

## 2021-06-05 ENCOUNTER — Other Ambulatory Visit: Payer: Self-pay

## 2021-06-05 DIAGNOSIS — L89152 Pressure ulcer of sacral region, stage 2: Secondary | ICD-10-CM | POA: Diagnosis not present

## 2021-06-05 DIAGNOSIS — L89313 Pressure ulcer of right buttock, stage 3: Secondary | ICD-10-CM | POA: Diagnosis not present
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
## 2021-06-06 NOTE — Progress Notes (Signed)
[name] ([id number]) Visit Report for 06/05/2021 Arrival Information Details Patient Name: Date of Service: [name], [name] 06/05/2021 11:00 A M Medical Record Number: [id number] Patient Account Number: [id number] Date of Birth/Sex: Treating RN: Oct 24, 1950 (70 y.o. [name] Primary Care [name]: [name] Other Clinician: Referring [name]: Treating [name]/Extender: [name] in Treatment: 3 Visit Information History Since Last Visit Added or deleted any medications: No Patient Arrived: Wheel Chair Any new allergies or adverse reactions: No Arrival Time: 10:42 Had a fall or experienced change in No Transfer Assistance: Civil Service fast streamer activities of daily living that may affect Patient Identification Verified: Yes risk of falls: Secondary Verification Process Completed: Yes Signs or symptoms of abuse/neglect since last visito No Patient Requires Transmission-Based Precautions: No Hospitalized since last visit: No Patient Has Alerts: Yes Implantable device outside of the clinic excluding No Patient Alerts: Patient on Blood Thinner cellular tissue based products placed in the center since last visit: Has Dressing in Place as Prescribed: Yes Pain Present Now: No Electronic Signature(s) Signed: 06/05/2021 5:16:41 PM By: [name] Entered By: [name] on 06/05/2021 10:43:09 -------------------------------------------------------------------------------- Clinic Level of Care Assessment Details Patient Name: Date of Service: [name], [name] 06/05/2021 11:00 A M Medical Record Number: [id number] Patient Account Number: [id number] Date of Birth/Sex: Treating RN: 02/07/1951 (70 y.o. [name] Primary Care [name]: [name] Other Clinician: Referring [name]: Treating [name]/Extender: [name] in Treatment: 3 Clinic Level of Care Assessment Items TOOL 4 Quantity Score X- 1  0 Use when only an EandM is performed on FOLLOW-UP visit ASSESSMENTS - Nursing Assessment / Reassessment X- 1 10 Reassessment of Co-morbidities (includes updates in patient status) X- 1 5 Reassessment of Adherence to Treatment Plan ASSESSMENTS - Wound and Skin A ssessment / Reassessment X - Simple Wound Assessment / Reassessment - one wound 1 5 []  - 0 Complex Wound Assessment / Reassessment - multiple wounds []  - 0 Dermatologic / Skin Assessment (not related to wound area) ASSESSMENTS - Focused Assessment []  - 0 Circumferential Edema Measurements - multi extremities []  - 0 Nutritional Assessment / Counseling / Intervention []  - 0 Lower Extremity Assessment (monofilament, tuning fork, pulses) []  - 0 Peripheral Arterial Disease Assessment (using hand held doppler) ASSESSMENTS - Ostomy and/or Continence Assessment and Care []  - 0 Incontinence Assessment and Management []  - 0 Ostomy Care Assessment and Management (repouching, etc.) PROCESS - Coordination of Care []  - 0 Simple Patient / Family Education for ongoing care X- 1 20 Complex (extensive) Patient / Family Education for ongoing care []  - 0 Staff obtains Programmer, systems, Records, T Results / Process Orders est X- 1 10 Staff telephones HHA, Nursing Homes / Clarify orders / etc []  - 0 Routine Transfer to another Facility (non-emergent condition) []  - 0 Routine Hospital Admission (non-emergent condition) []  - 0 New Admissions / Biomedical engineer / Ordering NPWT Apligraf, etc. , []  - 0 Emergency Hospital Admission (emergent condition) []  - 0 Simple Discharge Coordination []  - 0 Complex (extensive) Discharge Coordination PROCESS - Special Needs []  - 0 Pediatric / Minor Patient Management []  - 0 Isolation Patient Management []  - 0 Hearing / Language / Visual special needs []  - 0 Assessment of Community assistance (transportation, D/C planning, etc.) []  - 0 Additional assistance / Altered mentation []  -  0 Support Surface(s) Assessment (bed, cushion, seat, etc.) INTERVENTIONS - Wound Cleansing / Measurement X - Simple Wound Cleansing - one wound 1 5 []  -  0 Complex Wound Cleansing - multiple wounds X- 1 5 Wound Imaging (photographs - any number of wounds) []  - 0 Wound Tracing (instead of photographs) X- 1 5 Simple Wound Measurement - one wound []  - 0 Complex Wound Measurement - multiple wounds INTERVENTIONS - Wound Dressings []  - 0 Small Wound Dressing one or multiple wounds X- 1 15 Medium Wound Dressing one or multiple wounds []  - 0 Large Wound Dressing one or multiple wounds []  - 0 Application of Medications - topical []  - 0 Application of Medications - injection INTERVENTIONS - Miscellaneous []  - 0 External ear exam []  - 0 Specimen Collection (cultures, biopsies, blood, body fluids, etc.) []  - 0 Specimen(s) / Culture(s) sent or taken to Lab for analysis []  - 0 Patient Transfer (multiple staff / Civil Service fast streamer / Similar devices) []  - 0 Simple Staple / Suture removal (25 or less) []  - 0 Complex Staple / Suture removal (26 or more) []  - 0 Hypo / Hyperglycemic Management (close monitor of Blood Glucose) []  - 0 Ankle / Brachial Index (ABI) - do not check if billed separately X- 1 5 Vital Signs Has the patient been seen at the hospital within the last three years: Yes Total Score: 85 Level Of Care: New/Established - Level 3 Electronic Signature(s) Signed: 06/05/2021 5:16:41 PM By: [name] Entered By: [name] on 06/05/2021 11:06:53 -------------------------------------------------------------------------------- Encounter Discharge Information Details Patient Name: Date of Service: [name], [name] 06/05/2021 11:00 A M Medical Record Number: [id number] Patient Account Number: [id number] Date of Birth/Sex: Treating RN: 09/13/1950 (70 y.o. [name] Primary Care Luvina Poirier: [name] Other Clinician: Referring [name]: Treating  [name]/Extender: [name] in Treatment: 3 Encounter Discharge Information Items Discharge Condition: Stable Ambulatory Status: Wheelchair Discharge Destination: Home Transportation: Private Auto Schedule Follow-up Appointment: Yes Clinical Summary of Care: Provided on 06/05/2021 Form Type Recipient Paper Patient Patient Electronic Signature(s) Signed: 06/05/2021 5:16:41 PM By: [name] Entered By: [name] on 06/05/2021 11:18:28 -------------------------------------------------------------------------------- Lower Extremity Assessment Details Patient Name: Date of Service: [place], [name] 06/05/2021 11:00 A M Medical Record Number: [id number] Patient Account Number: [id number] Date of Birth/Sex: Treating RN: 1950/12/23 (70 y.o. [name] Primary Care Levent Kornegay: [name] Other Clinician: Referring Nathalya Wolanski: Treating Concepcion Gillott/Extender: [name] in Treatment: 3 Electronic Signature(s) Signed: 06/05/2021 5:16:41 PM By: [name] Entered By: [name] on 06/05/2021 10:43:25 -------------------------------------------------------------------------------- Multi Wound Chart Details Patient Name: Date of Service: [place], [name] 06/05/2021 11:00 A M Medical Record Number: [id number] Patient Account Number: [id number] Date of Birth/Sex: Treating RN: [birth year] (70 y.o. [name], [name] Primary Care Etherine Mackowiak: [name] Other Clinician: Referring Jayshaun Phillips: Treating Jayston Trevino/Extender: [name] in Treatment: 3 Vital Signs Height(in): Pulse(bpm): 51 Weight(lbs): Blood Pressure(mmHg): 119/76 Body Mass Index(BMI): Temperature(F): 98.6 Respiratory Rate(breaths/min): 16 Photos: [N/A:N/A] Sacrum N/A N/A Wound Location: Pressure Injury N/A N/A Wounding Event: Pressure Ulcer N/A N/A Primary Etiology: Anemia, Chronic Obstructive N/A N/A Comorbid  History: Pulmonary Disease (COPD), Congestive Heart Failure, Coronary Artery Disease, Hypertension, Rheumatoid Arthritis, Paraplegia, Received Chemotherapy 05/27/2021 N/A N/A Date Acquired: 1 N/A N/A Weeks of Treatment: Open N/A N/A Wound Status: 0.3x0.3x0.3 N/A N/A Measurements L x W x D (cm) 0.071 N/A N/A A (cm) : rea 0.021 N/A N/A Volume (cm) : 43.70% N/A N/A % Reduction in A rea: 66.70% N/A N/A % Reduction in Volume: Category/Stage II N/A N/A Classification: Medium N/A N/A Exudate A mount: Serosanguineous N/A  N/A Exudate Type: red, brown N/A N/A Exudate Color: Distinct, outline attached N/A N/A Wound Margin: Large (67-100%) N/A N/A Granulation A mount: Pink N/A N/A Granulation Quality: None Present (0%) N/A N/A Necrotic A mount: Fat Layer (Subcutaneous Tissue): Yes N/A N/A Exposed Structures: Fascia: No Tendon: No Muscle: No Joint: No Bone: No Medium (34-66%) N/A N/A Epithelialization: Treatment Notes Wound #2 (Sacrum) Cleanser Wound Cleanser Discharge Instruction: Cleanse the wound with wound cleanser prior to applying a clean dressing using gauze sponges, not tissue or cotton balls. Peri-Wound Care Topical Primary Dressing Hydrofera Blue Classic Foam, 2x2 in Discharge Instruction: Moisten with saline prior to applying to wound bed Secondary Dressing Woven Gauze Sponge, Non-Sterile 4x4 in Discharge Instruction: Apply over primary dressing as directed. Zetuvit Plus Silicone Border Dressing 4x4 (in/in) Discharge Instruction: Apply silicone border over primary dressing as directed. Secured With Compression Wrap Compression Stockings Environmental education officer) Signed: 06/05/2021 12:43:50 PM By: Kalman Shan DO Signed: 06/06/2021 12:27:53 PM By: Rhae Hammock RN Entered By: Kalman Shan on 06/05/2021 12:37:49 -------------------------------------------------------------------------------- Multi-Disciplinary Care Plan  Details Patient Name: Date of Service: [place], [name] 06/05/2021 11:00 A M Medical Record Number: [id number] Patient Account Number: [id number] Date of Birth/Sex: Treating RN: [birth year] (70 y.o. [name] Primary Care Zakiya Sporrer: [name] Other Clinician: Referring Dempsey Knotek: Treating Mareli Antunes/Extender: [name] in Treatment: 3 Active Inactive Pressure Nursing Diagnoses: Knowledge deficit related to management of pressures ulcers Goals: Patient/caregiver will verbalize understanding of pressure ulcer management Date Initiated: 05/15/2021 Target Resolution Date: 06/19/2021 Goal Status: Active Interventions: Assess: immobility, friction, shearing, incontinence upon admission and as needed Assess offloading mechanisms upon admission and as needed Assess potential for pressure ulcer upon admission and as needed Provide education on pressure ulcers Notes: Wound/Skin Impairment Nursing Diagnoses: Impaired tissue integrity Goals: Patient/caregiver will verbalize understanding of skin care regimen Date Initiated: 05/15/2021 Target Resolution Date: 06/19/2021 Goal Status: Active Ulcer/skin breakdown will have a volume reduction of 30% by week 4 Date Initiated: 05/15/2021 Target Resolution Date: 06/19/2021 Goal Status: Active Interventions: Assess patient/caregiver ability to obtain necessary supplies Assess patient/caregiver ability to perform ulcer/skin care regimen upon admission and as needed Assess ulceration(s) every visit Provide education on ulcer and skin care Treatment Activities: Skin care regimen initiated : 05/15/2021 Topical wound management initiated : 05/15/2021 Notes: Electronic Signature(s) Signed: 06/05/2021 5:16:41 PM By: [name] Entered By: [name] on 06/05/2021 10:41:31 -------------------------------------------------------------------------------- Pain Assessment Details Patient Name: Date of  Service: [place], [name] 06/05/2021 11:00 A M Medical Record Number: [id number] Patient Account Number: [id number] Date of Birth/Sex: Treating RN: 1950-10-10 (70 y.o. [name] Primary Care Adilenne Ashworth: [name] Other Clinician: Referring Chandy Tarman: Treating Pratt Bress/Extender: [name] in Treatment: 3 Active Problems Location of Pain Severity and Description of Pain Patient Has Paino No Site Locations Pain Management and Medication Current Pain Management: Electronic Signature(s) Signed: 06/05/2021 5:16:41 PM By: [name] Entered By: [name] on 06/05/2021 10:43:18 -------------------------------------------------------------------------------- Patient/Caregiver Education Details Patient Name: Date of Service: CRA WFO [name], [name] 11/10/2022andnbsp11:00 [place] Record Number: [id number] Patient Account Number: [id number] Date of Birth/Gender: Treating RN: 20-Nov-1950 (70 y.o. [name] Primary Care Physician: [name] Other Clinician: Referring Physician: Treating Physician/Extender: [name] in Treatment: 3 Education Assessment Education Provided To: Patient and Caregiver Education Topics Provided Pressure: Methods: Explain/Verbal, Printed Responses: State content correctly Wound/Skin Impairment: Methods: Explain/Verbal, Printed Responses: State content correctly Electronic Signature(s) Signed: 06/05/2021 5:16:41  PM By: [name] Entered By: [name] on 06/05/2021 10:41:54 -------------------------------------------------------------------------------- Wound Assessment Details Patient Name: Date of Service: [place], [name] 06/05/2021 11:00 A M Medical Record Number: [id number] Patient Account Number: [id number] Date of Birth/Sex: Treating RN: 1951-06-15 (70 y.o. [name] Primary Care Teonna Coonan: [name] Other Clinician: Referring  Gordie Crumby: Treating Sharaya Boruff/Extender: [name] in Treatment: 3 Wound Status Wound Number: 2 Primary Pressure Ulcer Etiology: Wound Location: Sacrum Wound Open Wounding Event: Pressure Injury Status: Date Acquired: 05/27/2021 Comorbid Anemia, Chronic Obstructive Pulmonary Disease (COPD), Weeks Of Treatment: 1 History: Congestive Heart Failure, Coronary Artery Disease, Hypertension, Clustered Wound: No Rheumatoid Arthritis, Paraplegia, Received Chemotherapy Photos Wound Measurements Length: (cm) 0.3 Width: (cm) 0.3 Depth: (cm) 0.3 Area: (cm) 0.071 Volume: (cm) 0.021 % Reduction in Area: 43.7% % Reduction in Volume: 66.7% Epithelialization: Medium (34-66%) Tunneling: No Undermining: No Wound Description Classification: Category/Stage II Wound Margin: Distinct, outline attached Exudate Amount: Medium Exudate Type: Serosanguineous Exudate Color: red, brown Foul Odor After Cleansing: No Slough/Fibrino No Wound Bed Granulation Amount: Large (67-100%) Exposed Structure Granulation Quality: Pink Fascia Exposed: No Necrotic Amount: None Present (0%) Fat Layer (Subcutaneous Tissue) Exposed: Yes Tendon Exposed: No Muscle Exposed: No Joint Exposed: No Bone Exposed: No Treatment Notes Wound #2 (Sacrum) Cleanser Wound Cleanser Discharge Instruction: Cleanse the wound with wound cleanser prior to applying a clean dressing using gauze sponges, not tissue or cotton balls. Peri-Wound Care Topical Primary Dressing Hydrofera Blue Classic Foam, 2x2 in Discharge Instruction: Moisten with saline prior to applying to wound bed Secondary Dressing Woven Gauze Sponge, Non-Sterile 4x4 in Discharge Instruction: Apply over primary dressing as directed. Zetuvit Plus Silicone Border Dressing 4x4 (in/in) Discharge Instruction: Apply silicone border over primary dressing as directed. Secured With Compression Wrap Compression Stockings Sport and exercise psychologist) Signed: 06/05/2021 5:16:41 PM By: [name] Entered By: [name] on 06/05/2021 10:56:29 -------------------------------------------------------------------------------- Vitals Details Patient Name: Date of Service: [place], [name] 06/05/2021 11:00 A M Medical Record Number: [id number] Patient Account Number: [id number] Date of Birth/Sex: Treating RN: May 26, 1951 (70 y.o. [name] Primary Care Cid Agena: [name] Other Clinician: Referring Nyiesha Beever: Treating Kimoni Pagliarulo/Extender: [name] in Treatment: 3 Vital Signs Time Taken: 10:42 Temperature (F): 98.6 Pulse (bpm): 76 Respiratory Rate (breaths/min): 16 Blood Pressure (mmHg): 119/76 Reference Range: 80 - 120 mg / dl Electronic Signature(s) Signed: 06/05/2021 5:16:41 PM By: [name] Entered By: [name] on 06/05/2021 10:42:41

## 2021-06-09 ENCOUNTER — Other Ambulatory Visit: Payer: Self-pay

## 2021-06-09 ENCOUNTER — Ambulatory Visit (INDEPENDENT_AMBULATORY_CARE_PROVIDER_SITE_OTHER): Payer: Medicare Other | Admitting: Orthopedic Surgery

## 2021-06-09 DIAGNOSIS — Z89431 Acquired absence of right foot: Secondary | ICD-10-CM

## 2021-06-09 NOTE — Progress Notes (Signed)
Requested orders for procedure on 07-02-21.

## 2021-06-13 ENCOUNTER — Encounter (HOSPITAL_BASED_OUTPATIENT_CLINIC_OR_DEPARTMENT_OTHER): Payer: Medicare Other | Admitting: Internal Medicine

## 2021-06-13 ENCOUNTER — Other Ambulatory Visit: Payer: Self-pay

## 2021-06-13 DIAGNOSIS — L89313 Pressure ulcer of right buttock, stage 3: Secondary | ICD-10-CM | POA: Diagnosis not present

## 2021-06-13 DIAGNOSIS — L89152 Pressure ulcer of sacral region, stage 2: Secondary | ICD-10-CM | POA: Diagnosis not present

## 2021-06-13 DIAGNOSIS — S30811A Abrasion of abdominal wall, initial encounter: Secondary | ICD-10-CM | POA: Diagnosis not present

## 2021-06-16 ENCOUNTER — Inpatient Hospital Stay: Payer: Medicare Other

## 2021-06-16 ENCOUNTER — Inpatient Hospital Stay: Payer: Medicare Other | Admitting: Oncology

## 2021-06-16 NOTE — Progress Notes (Signed)
BRIETTA, MANSO ([id number]) Visit Report for 06/13/2021 Arrival Information Details Patient Name: Date of Service: [place], [name] 06/13/2021 10:30 A M Medical Record Number: [id number] Patient Account Number: [id number] Date of Birth/Sex: Treating RN: [birth year] (70 y.o. [name] Primary Care Laurinda Carreno: Caprice Renshaw Other Clinician: Referring Cornelius Schuitema: Treating Avanthika Dehnert/Extender: Melvern Banker in Treatment: 4 Visit Information History Since Last Visit Added or deleted any medications: No Patient Arrived: Wheel Chair Any new allergies or adverse reactions: No Arrival Time: 10:43 Had a fall or experienced change in No Accompanied By: self activities of daily living that may affect Transfer Assistance: Harrel Lemon Lift risk of falls: Patient Identification Verified: Yes Signs or symptoms of abuse/neglect since last visito No Secondary Verification Process Completed: Yes Hospitalized since last visit: No Patient Requires Transmission-Based Precautions: No Implantable device outside of the clinic excluding No Patient Has Alerts: Yes cellular tissue based products placed in the center Patient Alerts: Patient on Blood Thinner since last visit: Has Dressing in Place as Prescribed: Yes Pain Present Now: No Electronic Signature(s) Signed: 06/13/2021 12:22:34 PM By: Levan Hurst RN, BSN Entered By: Levan Hurst on 06/13/2021 10:43:43 -------------------------------------------------------------------------------- Encounter Discharge Information Details Patient Name: Date of Service: [place], [name] 06/13/2021 10:30 A M Medical Record Number: [id number] Patient Account Number: [id number] Date of Birth/Sex: Treating RN: Sep 10, 1950 (70 y.o. [name], [name] Primary Care Juma Oxley: Caprice Renshaw Other Clinician: Referring Selah Zelman: Treating Imagine Nest/Extender: Melvern Banker in Treatment: 4 Encounter Discharge Information  Items Post Procedure Vitals Discharge Condition: Stable Temperature (F): 97.4 Ambulatory Status: Wheelchair Pulse (bpm): 74 Discharge Destination: [place] Respiratory Rate (breaths/min): 17 Telephoned: No Blood Pressure (mmHg): 147/74 Orders Sent: Yes Transportation: Private Auto Accompanied By: self Schedule Follow-up Appointment: Yes Clinical Summary of Care: Patient Declined Electronic Signature(s) Signed: 06/13/2021 12:21:54 PM By: Rhae Hammock RN Entered By: Rhae Hammock on 06/13/2021 11:38:21 -------------------------------------------------------------------------------- Lower Extremity Assessment Details Patient Name: Date of Service: [place], [name] 06/13/2021 10:30 A M Medical Record Number: [id number] Patient Account Number: [id number] Date of Birth/Sex: Treating RN: Sep 27, 1950 (70 y.o. [name] Primary Care Piera Downs: Caprice Renshaw Other Clinician: Referring Thi Klich: Treating Rocio Wolak/Extender: Melvern Banker in Treatment: 4 Electronic Signature(s) Signed: 06/13/2021 12:22:34 PM By: Levan Hurst RN, BSN Entered By: Levan Hurst on 06/13/2021 10:45:27 -------------------------------------------------------------------------------- Multi Wound Chart Details Patient Name: Date of Service: [place], [name] 06/13/2021 10:30 A M Medical Record Number: [id number] Patient Account Number: [id number] Date of Birth/Sex: Treating RN: Jul 25, 1951 (70 y.o. F) Primary Care Nissa Stannard: Caprice Renshaw Other Clinician: Referring Gwendy Boeder: Treating Lilley Hubble/Extender: Melvern Banker in Treatment: 4 Vital Signs Height(in): Pulse(bpm): 75 Weight(lbs): Blood Pressure(mmHg): 114/74 Body Mass Index(BMI): Temperature(F): 98.5 Respiratory Rate(breaths/min): 17 Photos: [N/A:N/A] Sacrum Right Abdomen - Lower Quadrant N/A Wound Location: Pressure Injury Blister N/A Wounding Event: Pressure  Ulcer Abrasion N/A Primary Etiology: Anemia, Chronic Obstructive Anemia, Chronic Obstructive N/A Comorbid History: Pulmonary Disease (COPD), Pulmonary Disease (COPD), Congestive Heart Failure, Coronary Congestive Heart Failure, Coronary Artery Disease, Hypertension, Artery Disease, Hypertension, Rheumatoid Arthritis, Paraplegia, Rheumatoid Arthritis, Paraplegia, Received Chemotherapy Received Chemotherapy 05/27/2021 05/27/2021 N/A Date Acquired: 2 0 N/A Weeks of Treatment: Open Open N/A Wound Status: 0.2x0.2x0.3 1.5x3.4x0.2 N/A Measurements L x W x D (cm) 0.031 4.006 N/A A (cm) : rea 0.009 0.801 N/A Volume (cm) : 75.40% 0.00% N/A % Reduction in Area: 85.70% 0.00% N/A % Reduction in Volume: Category/Stage II Full Thickness Without  Exposed N/A Classification: Support Structures Medium Medium N/A Exudate Amount: Serosanguineous Serosanguineous N/A Exudate Type: red, brown red, brown N/A Exudate Color: Distinct, outline attached Epibole N/A Wound Margin: Large (67-100%) None Present (0%) N/A Granulation Amount: Pink N/A N/A Granulation Quality: None Present (0%) Large (67-100%) N/A Necrotic Amount: N/A Eschar, Adherent Slough N/A Necrotic Tissue: Fat Layer (Subcutaneous Tissue): Yes Fascia: No N/A Exposed Structures: Fascia: No Fat Layer (Subcutaneous Tissue): No Tendon: No Tendon: No Muscle: No Muscle: No Joint: No Joint: No Bone: No Bone: No Medium (34-66%) None N/A Epithelialization: N/A Debridement - Selective/Open Wound N/A Debridement: Pre-procedure Verification/Time Out N/A 11:10 N/A Taken: N/A Lidocaine N/A Pain Control: N/A Slough N/A Tissue Debrided: N/A Non-Viable Tissue N/A Level: N/A 4.76 N/A Debridement A (sq cm): rea N/A Curette N/A Instrument: N/A Minimum N/A Bleeding: N/A Pressure N/A Hemostasis A chieved: N/A 0 N/A Procedural Pain: N/A 0 N/A Post Procedural Pain: N/A Procedure was tolerated well N/A Debridement Treatment  Response: N/A 1.5x3.4x0.2 N/A Post Debridement Measurements L x W x D (cm) N/A 0.801 N/A Post Debridement Volume: (cm) N/A Debridement N/A Procedures Performed: Treatment Notes Wound #2 (Sacrum) Cleanser Wound Cleanser Discharge Instruction: Cleanse the wound with wound cleanser prior to applying a clean dressing using gauze sponges, not tissue or cotton balls. Peri-Wound Care Topical Primary Dressing Hydrofera Blue Classic Foam, 2x2 in Discharge Instruction: Moisten with saline prior to applying to wound bed Secondary Dressing Woven Gauze Sponge, Non-Sterile 4x4 in Discharge Instruction: Apply over primary dressing as directed. Zetuvit Plus Silicone Border Dressing 4x4 (in/in) Discharge Instruction: Apply silicone border over primary dressing as directed. Secured With Compression Wrap Compression Stockings Add-Ons Wound #3 (Abdomen - Lower Quadrant) Wound Laterality: Right Cleanser Normal Saline Discharge Instruction: Cleanse the wound with Normal Saline prior to applying a clean dressing using gauze sponges, not tissue or cotton balls. Soap and Water Discharge Instruction: May shower and wash wound with dial antibacterial soap and water prior to dressing change. Peri-Wound Care Skin Prep Discharge Instruction: Use skin prep as directed Topical Primary Dressing Santyl Ointment Discharge Instruction: Apply nickel thick amount to wound bed as instructed Secondary Dressing Woven Gauze Sponge, Non-Sterile 4x4 in Discharge Instruction: Apply over primary dressing as directed. Zetuvit Plus Silicone Border Dressing 4x4 (in/in) Discharge Instruction: Apply silicone border over primary dressing as directed. Secured With Compression Wrap Compression Stockings Add-Ons Electronic Signature(s) Signed: 06/13/2021 11:59:25 AM By: Kalman Shan DO Entered By: Kalman Shan on 06/13/2021  11:47:40 -------------------------------------------------------------------------------- Multi-Disciplinary Care Plan Details Patient Name: Date of Service: [place], [name] 06/13/2021 10:30 A M Medical Record Number: [id number] Patient Account Number: [id number] Date of Birth/Sex: Treating RN: [birth year] (70 y.o. [name] Primary Care Grizelda Piscopo: Caprice Renshaw Other Clinician: Referring Ranika Mcniel: Treating Keaisha Sublette/Extender: Melvern Banker in Treatment: 4 Active Inactive Pressure Nursing Diagnoses: Knowledge deficit related to management of pressures ulcers Goals: Patient/caregiver will verbalize understanding of pressure ulcer management Date Initiated: 05/15/2021 Target Resolution Date: 06/19/2021 Goal Status: Active Interventions: Assess: immobility, friction, shearing, incontinence upon admission and as needed Assess offloading mechanisms upon admission and as needed Assess potential for pressure ulcer upon admission and as needed Provide education on pressure ulcers Notes: Wound/Skin Impairment Nursing Diagnoses: Impaired tissue integrity Goals: Patient/caregiver will verbalize understanding of skin care regimen Date Initiated: 05/15/2021 Target Resolution Date: 06/19/2021 Goal Status: Active Ulcer/skin breakdown will have a volume reduction of 30% by week 4 Date Initiated: 05/15/2021 Target Resolution Date: 06/19/2021 Goal Status: Active Interventions: Assess patient/caregiver ability to  obtain necessary supplies Assess patient/caregiver ability to perform ulcer/skin care regimen upon admission and as needed Assess ulceration(s) every visit Provide education on ulcer and skin care Treatment Activities: Skin care regimen initiated : 05/15/2021 Topical wound management initiated : 05/15/2021 Notes: Electronic Signature(s) Signed: 06/13/2021 12:22:34 PM By: Levan Hurst RN, BSN Entered By: Levan Hurst on 06/13/2021  11:14:11 -------------------------------------------------------------------------------- Pain Assessment Details Patient Name: Date of Service: [place], [name] 06/13/2021 10:30 A M Medical Record Number: [id number] Patient Account Number: [id number] Date of Birth/Sex: Treating RN: [birth year] (70 y.o. [name] Primary Care Christophr Calix: Caprice Renshaw Other Clinician: Referring Yakira Duquette: Treating Skylan Gift/Extender: Melvern Banker in Treatment: 4 Active Problems Location of Pain Severity and Description of Pain Patient Has Paino No Site Locations Pain Management and Medication Current Pain Management: Electronic Signature(s) Signed: 06/13/2021 12:22:34 PM By: Levan Hurst RN, BSN Entered By: Levan Hurst on 06/13/2021 10:45:21 -------------------------------------------------------------------------------- Patient/Caregiver Education Details Patient Name: Date of Service: [name], [name] 11/18/2022andnbsp10:30 A M Medical Record Number: [id number] Patient Account Number: [id number] Date of Birth/Gender: Treating RN: 09-07-1950 (70 y.o. [name] Primary Care Physician: Caprice Renshaw Other Clinician: Referring Physician: Treating Physician/Extender: Melvern Banker in Treatment: 4 Education Assessment Education Provided To: Patient Education Topics Provided Wound/Skin Impairment: Methods: Explain/Verbal Responses: State content correctly Electronic Signature(s) Signed: 06/13/2021 12:22:34 PM By: Levan Hurst RN, BSN Entered By: Levan Hurst on 06/13/2021 11:14:31 -------------------------------------------------------------------------------- Wound Assessment Details Patient Name: Date of Service: [place], [name] 06/13/2021 10:30 A M Medical Record Number: [id number] Patient Account Number: [id number] Date of Birth/Sex: Treating RN: March 26, 1951 (70 y.o. [name] Primary Care Artia Singley:  Caprice Renshaw Other Clinician: Referring Korry Dalgleish: Treating Marche Hottenstein/Extender: Melvern Banker in Treatment: 4 Wound Status Wound Number: 2 Primary Pressure Ulcer Etiology: Wound Location: Sacrum Wound Open Wounding Event: Pressure Injury Status: Date Acquired: 05/27/2021 Comorbid Anemia, Chronic Obstructive Pulmonary Disease (COPD), Weeks Of Treatment: 2 History: Congestive Heart Failure, Coronary Artery Disease, Hypertension, Clustered Wound: No Rheumatoid Arthritis, Paraplegia, Received Chemotherapy Photos Wound Measurements Length: (cm) 0.2 Width: (cm) 0.2 Depth: (cm) 0.3 Area: (cm) 0.031 Volume: (cm) 0.009 % Reduction in Area: 75.4% % Reduction in Volume: 85.7% Epithelialization: Medium (34-66%) Tunneling: No Undermining: No Wound Description Classification: Category/Stage II Wound Margin: Distinct, outline attached Exudate Amount: Medium Exudate Type: Serosanguineous Exudate Color: red, brown Foul Odor After Cleansing: No Slough/Fibrino No Wound Bed Granulation Amount: Large (67-100%) Exposed Structure Granulation Quality: Pink Fascia Exposed: No Necrotic Amount: None Present (0%) Fat Layer (Subcutaneous Tissue) Exposed: Yes Tendon Exposed: No Muscle Exposed: No Joint Exposed: No Bone Exposed: No Treatment Notes Wound #2 (Sacrum) Cleanser Wound Cleanser Discharge Instruction: Cleanse the wound with wound cleanser prior to applying a clean dressing using gauze sponges, not tissue or cotton balls. Peri-Wound Care Topical Primary Dressing Hydrofera Blue Classic Foam, 2x2 in Discharge Instruction: Moisten with saline prior to applying to wound bed Secondary Dressing Woven Gauze Sponge, Non-Sterile 4x4 in Discharge Instruction: Apply over primary dressing as directed. Zetuvit Plus Silicone Border Dressing 4x4 (in/in) Discharge Instruction: Apply silicone border over primary dressing as directed. Secured With Compression  Wrap Compression Stockings Environmental education officer) Signed: 06/13/2021 12:22:34 PM By: Levan Hurst RN, BSN Signed: 06/16/2021 3:01:27 PM By: Sandre Kitty Entered By: Sandre Kitty on 06/13/2021 11:02:39 -------------------------------------------------------------------------------- Wound Assessment Details Patient Name: Date of Service: [place], [name] 06/13/2021 10:30 A M Medical Record Number: [id number] Patient Account Number:  [id number] Date of Birth/Sex: Treating RN: [birth year] (70 y.o. [name] Primary Care Aniken Monestime: Caprice Renshaw Other Clinician: Referring Tangia Pinard: Treating Alvilda Mckenna/Extender: Melvern Banker in Treatment: 4 Wound Status Wound Number: 3 Primary Abrasion Etiology: Wound Location: Right Abdomen - Lower Quadrant Wound Open Wounding Event: Blister Status: Date Acquired: 05/27/2021 Comorbid Anemia, Chronic Obstructive Pulmonary Disease (COPD), Weeks Of Treatment: 0 History: Congestive Heart Failure, Coronary Artery Disease, Hypertension, Clustered Wound: No Rheumatoid Arthritis, Paraplegia, Received Chemotherapy Photos Wound Measurements Length: (cm) 1.5 Width: (cm) 3.4 Depth: (cm) 0.2 Area: (cm) 4.006 Volume: (cm) 0.801 % Reduction in Area: 0% % Reduction in Volume: 0% Epithelialization: None Tunneling: No Undermining: No Wound Description Classification: Full Thickness Without Exposed Support Structures Wound Margin: Epibole Exudate Amount: Medium Exudate Type: Serosanguineous Exudate Color: red, brown Foul Odor After Cleansing: No Slough/Fibrino Yes Wound Bed Granulation Amount: None Present (0%) Exposed Structure Necrotic Amount: Large (67-100%) Fascia Exposed: No Necrotic Quality: Eschar, Adherent Slough Fat Layer (Subcutaneous Tissue) Exposed: No Tendon Exposed: No Muscle Exposed: No Joint Exposed: No Bone Exposed: No Treatment Notes Wound #3 (Abdomen - Lower Quadrant) Wound  Laterality: Right Cleanser Normal Saline Discharge Instruction: Cleanse the wound with Normal Saline prior to applying a clean dressing using gauze sponges, not tissue or cotton balls. Soap and Water Discharge Instruction: May shower and wash wound with dial antibacterial soap and water prior to dressing change. Peri-Wound Care Skin Prep Discharge Instruction: Use skin prep as directed Topical Primary Dressing Santyl Ointment Discharge Instruction: Apply nickel thick amount to wound bed as instructed Secondary Dressing Woven Gauze Sponge, Non-Sterile 4x4 in Discharge Instruction: Apply over primary dressing as directed. Zetuvit Plus Silicone Border Dressing 4x4 (in/in) Discharge Instruction: Apply silicone border over primary dressing as directed. Secured With Compression Wrap Compression Stockings Environmental education officer) Signed: 06/13/2021 12:22:34 PM By: Levan Hurst RN, BSN Signed: 06/16/2021 3:01:27 PM By: Sandre Kitty Entered By: Sandre Kitty on 06/13/2021 10:58:47 -------------------------------------------------------------------------------- [place] Details Patient Name: Date of Service: [place], [name] 06/13/2021 10:30 A M Medical Record Number: [id number] Patient Account Number: [id number] Date of Birth/Sex: Treating RN: [birth year] (69 y.o. [name] Primary Care Essense Bousquet: Caprice Renshaw Other Clinician: Referring Cilicia Borden: Treating Andrika Peraza/Extender: Melvern Banker in Treatment: 4 Vital Signs Time Taken: 10:43 Temperature (F): 98.5 Pulse (bpm): 74 Respiratory Rate (breaths/min): 17 Blood Pressure (mmHg): 114/74 Reference Range: 80 - 120 mg / dl Electronic Signature(s) Signed: 06/13/2021 12:22:34 PM By: Levan Hurst RN, BSN Entered By: Levan Hurst on 06/13/2021 10:44:30
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
## 2021-06-26 ENCOUNTER — Other Ambulatory Visit: Payer: Self-pay

## 2021-06-26 ENCOUNTER — Encounter (HOSPITAL_BASED_OUTPATIENT_CLINIC_OR_DEPARTMENT_OTHER): Payer: Medicare Other | Attending: Internal Medicine | Admitting: Internal Medicine

## 2021-06-26 DIAGNOSIS — G8220 Paraplegia, unspecified: Secondary | ICD-10-CM | POA: Insufficient documentation

## 2021-06-26 DIAGNOSIS — L89152 Pressure ulcer of sacral region, stage 2: Secondary | ICD-10-CM | POA: Insufficient documentation

## 2021-06-26 DIAGNOSIS — Z87891 Personal history of nicotine dependence: Secondary | ICD-10-CM | POA: Diagnosis not present

## 2021-06-26 DIAGNOSIS — Z853 Personal history of malignant neoplasm of breast: Secondary | ICD-10-CM | POA: Diagnosis not present

## 2021-06-26 DIAGNOSIS — X58XXXA Exposure to other specified factors, initial encounter: Secondary | ICD-10-CM | POA: Insufficient documentation

## 2021-06-26 DIAGNOSIS — B182 Chronic viral hepatitis C: Secondary | ICD-10-CM | POA: Diagnosis not present

## 2021-06-26 DIAGNOSIS — L89313 Pressure ulcer of right buttock, stage 3: Secondary | ICD-10-CM | POA: Insufficient documentation

## 2021-06-26 DIAGNOSIS — M069 Rheumatoid arthritis, unspecified: Secondary | ICD-10-CM | POA: Diagnosis not present

## 2021-06-26 DIAGNOSIS — I509 Heart failure, unspecified: Secondary | ICD-10-CM | POA: Insufficient documentation

## 2021-06-26 DIAGNOSIS — I110 Hypertensive heart disease with heart failure: Secondary | ICD-10-CM | POA: Insufficient documentation

## 2021-06-26 DIAGNOSIS — S30811A Abrasion of abdominal wall, initial encounter: Secondary | ICD-10-CM | POA: Diagnosis not present

## 2021-06-26 DIAGNOSIS — Z899 Acquired absence of limb, unspecified: Secondary | ICD-10-CM | POA: Diagnosis not present
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
## 2021-06-30 NOTE — H&P (Deleted)
  The note originally documented on this encounter has been moved the the encounter in which it belongs.  
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
## 2021-06-30 NOTE — Progress Notes (Signed)
MALEIGH, BAGOT ([id number]) Visit Report for 06/26/2021 Arrival Information Details Patient Name: Date of Service: [place], [name] 06/26/2021 3:00 PM Medical Record Number: [id number] Patient Account Number: [id number] Date of Birth/Sex: Treating RN: February 27, 1951 (70 y.o. [name] Primary Care Lanyah Spengler: Caprice Renshaw Other Clinician: Referring Azel Gumina: Treating Mena Simonis/Extender: Heath Lark in Treatment: 6 Visit Information History Since Last Visit Added or deleted any medications: No Patient Arrived: Wheel Chair Any new allergies or adverse reactions: No Arrival Time: 15:29 Had a fall or experienced change in No Accompanied By: alone activities of daily living that may affect Transfer Assistance: Harrel Lemon Lift risk of falls: Patient Identification Verified: Yes Signs or symptoms of abuse/neglect since last visito No Secondary Verification Process Completed: Yes Hospitalized since last visit: No Patient Requires Transmission-Based Precautions: No Implantable device outside of the clinic excluding No Patient Has Alerts: Yes cellular tissue based products placed in the center Patient Alerts: Patient on Blood Thinner since last visit: Has Dressing in Place as Prescribed: Yes Pain Present Now: No Electronic Signature(s) Signed: 06/30/2021 3:59:15 PM By: Levan Hurst RN, BSN Entered By: Levan Hurst on 06/26/2021 15:29:53 -------------------------------------------------------------------------------- Clinic Level of Care Assessment Details Patient Name: Date of Service: [place], [name] 06/26/2021 3:00 PM Medical Record Number: [id number] Patient Account Number: [id number] Date of Birth/Sex: Treating RN: 1950-10-03 (70 y.o. [name] Primary Care Marjo Grosvenor: Caprice Renshaw Other Clinician: Referring Zeenat Jeanbaptiste: Treating Trenell Moxey/Extender: Heath Lark in Treatment: 6 Clinic Level of Care Assessment Items TOOL 4  Quantity Score X- 1 0 Use when only an EandM is performed on FOLLOW-UP visit ASSESSMENTS - Nursing Assessment / Reassessment X- 1 10 Reassessment of Co-morbidities (includes updates in patient status) X- 1 5 Reassessment of Adherence to Treatment Plan ASSESSMENTS - Wound and Skin A ssessment / Reassessment []  - 0 Simple Wound Assessment / Reassessment - one wound X- 3 5 Complex Wound Assessment / Reassessment - multiple wounds []  - 0 Dermatologic / Skin Assessment (not related to wound area) ASSESSMENTS - Focused Assessment []  - 0 Circumferential Edema Measurements - multi extremities []  - 0 Nutritional Assessment / Counseling / Intervention []  - 0 Lower Extremity Assessment (monofilament, tuning fork, pulses) []  - 0 Peripheral Arterial Disease Assessment (using hand held doppler) ASSESSMENTS - Ostomy and/or Continence Assessment and Care []  - 0 Incontinence Assessment and Management []  - 0 Ostomy Care Assessment and Management (repouching, etc.) PROCESS - Coordination of Care X - Simple Patient / Family Education for ongoing care 1 15 []  - 0 Complex (extensive) Patient / Family Education for ongoing care X- 1 10 Staff obtains Programmer, systems, Records, T Results / Process Orders est X- 1 10 Staff telephones HHA, Nursing Homes / Clarify orders / etc []  - 0 Routine Transfer to another Facility (non-emergent condition) []  - 0 Routine Hospital Admission (non-emergent condition) []  - 0 New Admissions / Biomedical engineer / Ordering NPWT Apligraf, etc. , []  - 0 Emergency Hospital Admission (emergent condition) X- 1 10 Simple Discharge Coordination []  - 0 Complex (extensive) Discharge Coordination PROCESS - Special Needs []  - 0 Pediatric / Minor Patient Management []  - 0 Isolation Patient Management []  - 0 Hearing / Language / Visual special needs []  - 0 Assessment of Community assistance (transportation, D/C planning, etc.) []  - 0 Additional assistance / Altered  mentation []  - 0 Support Surface(s) Assessment (bed, cushion, seat, etc.) INTERVENTIONS - Wound Cleansing / Measurement []  - 0 Simple Wound Cleansing - one  wound X- 3 5 Complex Wound Cleansing - multiple wounds X- 1 5 Wound Imaging (photographs - any number of wounds) []  - 0 Wound Tracing (instead of photographs) []  - 0 Simple Wound Measurement - one wound X- 3 5 Complex Wound Measurement - multiple wounds INTERVENTIONS - Wound Dressings X - Small Wound Dressing one or multiple wounds 3 10 []  - 0 Medium Wound Dressing one or multiple wounds []  - 0 Large Wound Dressing one or multiple wounds []  - 0 Application of Medications - topical []  - 0 Application of Medications - injection INTERVENTIONS - Miscellaneous []  - 0 External ear exam []  - 0 Specimen Collection (cultures, biopsies, blood, body fluids, etc.) []  - 0 Specimen(s) / Culture(s) sent or taken to Lab for analysis X- 1 10 Patient Transfer (multiple staff / Civil Service fast streamer / Similar devices) []  - 0 Simple Staple / Suture removal (25 or less) []  - 0 Complex Staple / Suture removal (26 or more) []  - 0 Hypo / Hyperglycemic Management (close monitor of Blood Glucose) []  - 0 Ankle / Brachial Index (ABI) - do not check if billed separately X- 1 5 Vital Signs Has the patient been seen at the hospital within the last three years: Yes Total Score: 155 Level Of Care: New/Established - Level 4 Electronic Signature(s) Signed: 06/30/2021 3:59:15 PM By: Levan Hurst RN, BSN Entered By: Levan Hurst on 06/26/2021 17:23:09 -------------------------------------------------------------------------------- Encounter Discharge Information Details Patient Name: Date of Service: [place], [name] 06/26/2021 3:00 PM Medical Record Number: [id number] Patient Account Number: [id number] Date of Birth/Sex: Treating RN: Jul 13, 1951 (70 y.o. [name] Primary Care Katrinna Travieso: Caprice Renshaw Other Clinician: Referring  Aliza Moret: Treating Cyleigh Massaro/Extender: Heath Lark in Treatment: 6 Encounter Discharge Information Items Discharge Condition: Stable Ambulatory Status: Wheelchair Discharge Destination: Home Transportation: Private Auto Accompanied By: alone Schedule Follow-up Appointment: Yes Clinical Summary of Care: Patient Declined Electronic Signature(s) Signed: 06/30/2021 3:59:15 PM By: Levan Hurst RN, BSN Entered By: Levan Hurst on 06/26/2021 17:23:58 -------------------------------------------------------------------------------- Lower Extremity Assessment Details Patient Name: Date of Service: [place], [name] 06/26/2021 3:00 PM Medical Record Number: [id number] Patient Account Number: [id number] Date of Birth/Sex: Treating RN: 16-Feb-1951 (70 y.o. [name] Primary Care Ulyess Muto: Caprice Renshaw Other Clinician: Referring Dylynn Ketner: Treating Chelsa Stout/Extender: Heath Lark in Treatment: 6 Electronic Signature(s) Signed: 06/30/2021 3:59:15 PM By: Levan Hurst RN, BSN Entered By: Levan Hurst on 06/26/2021 15:40:31 -------------------------------------------------------------------------------- Multi Wound Chart Details Patient Name: Date of Service: [place], [name] 06/26/2021 3:00 PM Medical Record Number: [id number] Patient Account Number: [id number] Date of Birth/Sex: Treating RN: 1950-11-09 (70 y.o. [name], [name] Primary Care Keeghan Mcintire: Caprice Renshaw Other Clinician: Referring Johntae Broxterman: Treating Jendayi Berling/Extender: Heath Lark in Treatment: 6 Vital Signs Height(in): Pulse(bpm): 15 Weight(lbs): Blood Pressure(mmHg): 109/66 Body Mass Index(BMI): Temperature(F): 98.7 Respiratory Rate(breaths/min): 16 Photos: Sacrum Right Abdomen - Lower Quadrant Proximal Sacrum Wound Location: Pressure Injury Blister Pressure Injury Wounding Event: Pressure Ulcer Abrasion Pressure Ulcer Primary  Etiology: Anemia, Chronic Obstructive Anemia, Chronic Obstructive Anemia, Chronic Obstructive Comorbid History: Pulmonary Disease (COPD), Pulmonary Disease (COPD), Pulmonary Disease (COPD), Congestive Heart Failure, Coronary Congestive Heart Failure, Coronary Congestive Heart Failure, Coronary Artery Disease, Hypertension, Artery Disease, Hypertension, Artery Disease, Hypertension, Rheumatoid Arthritis, Paraplegia, Rheumatoid Arthritis, Paraplegia, Rheumatoid Arthritis, Paraplegia, Received Chemotherapy Received Chemotherapy Received Chemotherapy 05/27/2021 05/27/2021 06/26/2021 Date Acquired: 4 1 0 Weeks of Treatment: Open Open Open Wound Status: No No Yes Clustered Wound: N/A N/A 2  Clustered Quantity: 0.1x0.1x0.1 1.5x3.3x0.2 0.8x1.3x0.1 Measurements L x W x D (cm) 0.008 3.888 0.817 A (cm) : rea 0.001 0.778 0.082 Volume (cm) : 93.70% 2.90% 0.00% % Reduction in Area: 98.40% 2.90% 0.00% % Reduction in Volume: Category/Stage II Full Thickness Without Exposed Category/Stage II Classification: Support Structures Medium Medium Medium Exudate A mount: Serosanguineous Serosanguineous Serosanguineous Exudate Type: red, brown red, brown red, brown Exudate Color: Distinct, outline attached Epibole Flat and Intact Wound Margin: Large (67-100%) Small (1-33%) Large (67-100%) Granulation Amount: Pink N/A Red, Pink Granulation Quality: None Present (0%) Large (67-100%) None Present (0%) Necrotic Amount: N/A Eschar, Adherent Slough N/A Necrotic Tissue: Fat Layer (Subcutaneous Tissue): Yes Fat Layer (Subcutaneous Tissue): Yes Fat Layer (Subcutaneous Tissue): Yes Exposed Structures: Fascia: No Fascia: No Fascia: No Tendon: No Tendon: No Tendon: No Muscle: No Muscle: No Muscle: No Joint: No Joint: No Joint: No Bone: No Bone: No Bone: No Medium (34-66%) None None Epithelialization: Treatment Notes Electronic Signature(s) Signed: 06/26/2021 4:22:14 PM By: Kalman Shan DO Signed: 06/26/2021 5:20:51 PM By: Rhae Hammock RN Entered By: Kalman Shan on 06/26/2021 16:11:35 -------------------------------------------------------------------------------- Multi-Disciplinary Care Plan Details Patient Name: Date of Service: [facility] RD, [name] 06/26/2021 3:00 PM Medical Record Number: [id number] Patient Account Number: [id number] Date of Birth/Sex: Treating RN: 04/28/1951 (70 y.o. [name] Primary Care Kunio Cummiskey: Caprice Renshaw Other Clinician: Referring Gianna Calef: Treating Jaye Polidori/Extender: Heath Lark in Treatment: 6 Active Inactive Pressure Nursing Diagnoses: Knowledge deficit related to management of pressures ulcers Goals: Patient/caregiver will verbalize understanding of pressure ulcer management Date Initiated: 05/15/2021 Target Resolution Date: 07/25/2021 Goal Status: Active Interventions: Assess: immobility, friction, shearing, incontinence upon admission and as needed Assess offloading mechanisms upon admission and as needed Assess potential for pressure ulcer upon admission and as needed Provide education on pressure ulcers Notes: Wound/Skin Impairment Nursing Diagnoses: Impaired tissue integrity Goals: Patient/caregiver will verbalize understanding of skin care regimen Date Initiated: 05/15/2021 Target Resolution Date: 07/25/2021 Goal Status: Active Ulcer/skin breakdown will have a volume reduction of 30% by week 4 Date Initiated: 05/15/2021 Date Inactivated: 06/26/2021 Target Resolution Date: 06/19/2021 Goal Status: Met Interventions: Assess patient/caregiver ability to obtain necessary supplies Assess patient/caregiver ability to perform ulcer/skin care regimen upon admission and as needed Assess ulceration(s) every visit Provide education on ulcer and skin care Treatment Activities: Skin care regimen initiated : 05/15/2021 Topical wound management initiated :  05/15/2021 Notes: Electronic Signature(s) Signed: 06/30/2021 3:59:15 PM By: Levan Hurst RN, BSN Entered By: Levan Hurst on 06/26/2021 17:22:08 -------------------------------------------------------------------------------- Pain Assessment Details Patient Name: Date of Service: [employer], [name] 06/26/2021 3:00 PM Medical Record Number: [id number] Patient Account Number: [id number] Date of Birth/Sex: Treating RN: [birth year] (70 y.o. [name] Primary Care Jovann Luse: Caprice Renshaw Other Clinician: Referring Adream Parzych: Treating Fatina Sprankle/Extender: Heath Lark in Treatment: 6 Active Problems Location of Pain Severity and Description of Pain Patient Has Paino No Site Locations Pain Management and Medication Current Pain Management: Electronic Signature(s) Signed: 06/30/2021 3:59:15 PM By: Levan Hurst RN, BSN Entered By: Levan Hurst on 06/26/2021 15:30:18 -------------------------------------------------------------------------------- Patient/Caregiver Education Details Patient Name: Date of Service: [name], [name] 12/1/2022andnbsp3:00 PM Medical Record Number: [id number] Patient Account Number: [id number] Date of Birth/Gender: Treating RN: 15-Mar-1951 (70 y.o. [name] Primary Care Physician: Caprice Renshaw Other Clinician: Referring Physician: Treating Physician/Extender: Heath Lark in Treatment: 6 Education Assessment Education Provided To: Patient Education Topics Provided Wound/Skin Impairment: Methods: Explain/Verbal Responses: State content correctly Electronic  Signature(s) Signed: 06/30/2021 3:59:15 PM By: Levan Hurst RN, BSN Entered By: Levan Hurst on 06/26/2021 17:22:22 -------------------------------------------------------------------------------- Wound Assessment Details Patient Name: Date of Service: [place], [name] 06/26/2021 3:00 PM Medical Record Number:  [id number] Patient Account Number: [id number] Date of Birth/Sex: Treating RN: [birth year] (70 y.o. [name] Primary Care Kayo Zion: Caprice Renshaw Other Clinician: Referring Davan Nawabi: Treating Latorie Montesano/Extender: Heath Lark in Treatment: 6 Wound Status Wound Number: 2 Primary Pressure Ulcer Etiology: Wound Location: Sacrum Wound Open Wounding Event: Pressure Injury Status: Date Acquired: 05/27/2021 Comorbid Anemia, Chronic Obstructive Pulmonary Disease (COPD), Weeks Of Treatment: 4 History: Congestive Heart Failure, Coronary Artery Disease, Hypertension, Clustered Wound: No Rheumatoid Arthritis, Paraplegia, Received Chemotherapy Photos Wound Measurements Length: (cm) 0.1 Width: (cm) 0.1 Depth: (cm) 0.1 Area: (cm) 0.008 Volume: (cm) 0.001 % Reduction in Area: 93.7% % Reduction in Volume: 98.4% Epithelialization: Medium (34-66%) Tunneling: No Undermining: No Wound Description Classification: Category/Stage II Wound Margin: Distinct, outline attached Exudate Amount: Medium Exudate Type: Serosanguineous Exudate Color: red, brown Foul Odor After Cleansing: No Slough/Fibrino No Wound Bed Granulation Amount: Large (67-100%) Exposed Structure Granulation Quality: Pink Fascia Exposed: No Necrotic Amount: None Present (0%) Fat Layer (Subcutaneous Tissue) Exposed: Yes Tendon Exposed: No Muscle Exposed: No Joint Exposed: No Bone Exposed: No Treatment Notes Wound #2 (Sacrum) Cleanser Wound Cleanser Discharge Instruction: Cleanse the wound with wound cleanser prior to applying a clean dressing using gauze sponges, not tissue or cotton balls. Peri-Wound Care Topical Primary Dressing Hydrofera Blue Classic Foam, 2x2 in Discharge Instruction: Moisten with saline prior to applying to wound bed Secondary Dressing Woven Gauze Sponge, Non-Sterile 4x4 in Discharge Instruction: Apply over primary dressing as directed. Zetuvit Plus Silicone Border  Dressing 4x4 (in/in) Discharge Instruction: Apply silicone border over primary dressing as directed. Secured With Compression Wrap Compression Stockings Environmental education officer) Signed: 06/30/2021 3:59:15 PM By: Levan Hurst RN, BSN Entered By: Levan Hurst on 06/26/2021 15:59:27 -------------------------------------------------------------------------------- Wound Assessment Details Patient Name: Date of Service: CRA [place], [name] 06/26/2021 3:00 PM Medical Record Number: [id number] Patient Account Number: [id number] Date of Birth/Sex: Treating RN: Aug 11, 1950 (70 y.o. [name] Primary Care Vala Raffo: Caprice Renshaw Other Clinician: Referring Revecca Nachtigal: Treating Brynlynn Walko/Extender: Heath Lark in Treatment: 6 Wound Status Wound Number: 3 Primary Abrasion Etiology: Wound Location: Right Abdomen - Lower Quadrant Wound Open Wounding Event: Blister Status: Date Acquired: 05/27/2021 Comorbid Anemia, Chronic Obstructive Pulmonary Disease (COPD), Weeks Of Treatment: 1 History: Congestive Heart Failure, Coronary Artery Disease, Hypertension, Clustered Wound: No Rheumatoid Arthritis, Paraplegia, Received Chemotherapy Photos Wound Measurements Length: (cm) 1.5 Width: (cm) 3.3 Depth: (cm) 0.2 Area: (cm) 3.888 Volume: (cm) 0.778 % Reduction in Area: 2.9% % Reduction in Volume: 2.9% Epithelialization: None Tunneling: No Undermining: No Wound Description Classification: Full Thickness Without Exposed Support Structures Wound Margin: Epibole Exudate Amount: Medium Exudate Type: Serosanguineous Exudate Color: red, brown Foul Odor After Cleansing: No Slough/Fibrino Yes Wound Bed Granulation Amount: Small (1-33%) Exposed Structure Necrotic Amount: Large (67-100%) Fascia Exposed: No Necrotic Quality: Eschar, Adherent Slough Fat Layer (Subcutaneous Tissue) Exposed: Yes Tendon Exposed: No Muscle Exposed: No Joint Exposed: No Bone  Exposed: No Treatment Notes Wound #3 (Abdomen - Lower Quadrant) Wound Laterality: Right Cleanser Normal Saline Discharge Instruction: Cleanse the wound with Normal Saline prior to applying a clean dressing using gauze sponges, not tissue or cotton balls. Soap and Water Discharge Instruction: May shower and wash wound with dial antibacterial soap and water prior to dressing change. Peri-Wound Care  Topical Primary Dressing Hydrofera Blue Classic Foam, 2x2 in Discharge Instruction: Moisten with saline prior to applying to wound bed Santyl Ointment Discharge Instruction: Apply nickel thick amount to wound bed as instructed Secondary Dressing Woven Gauze Sponge, Non-Sterile 4x4 in Discharge Instruction: Apply over primary dressing as directed. Zetuvit Plus Silicone Border Dressing 4x4 (in/in) Discharge Instruction: Apply silicone border over primary dressing as directed. Secured With Compression Wrap Compression Stockings Environmental education officer) Signed: 06/30/2021 3:59:15 PM By: Levan Hurst RN, BSN Entered By: Levan Hurst on 06/26/2021 15:45:37 -------------------------------------------------------------------------------- Wound Assessment Details Patient Name: Date of Service: CRA [place], [name] 06/26/2021 3:00 PM Medical Record Number: [id number] Patient Account Number: [id number] Date of Birth/Sex: Treating RN: 1951-05-19 (70 y.o. [name] Primary Care Analya Louissaint: Caprice Renshaw Other Clinician: Referring Korianna Washer: Treating Yennifer Segovia/Extender: Heath Lark in Treatment: 6 Wound Status Wound Number: 4 Primary Pressure Ulcer Etiology: Wound Location: Proximal Sacrum Wound Open Wounding Event: Pressure Injury Status: Date Acquired: 06/26/2021 Comorbid Anemia, Chronic Obstructive Pulmonary Disease (COPD), Weeks Of Treatment: 0 History: Congestive Heart Failure, Coronary Artery Disease, Hypertension, Clustered Wound: Yes Rheumatoid  Arthritis, Paraplegia, Received Chemotherapy Photos Wound Measurements Length: (cm) 0.8 Width: (cm) 1.3 Depth: (cm) 0.1 Clustered Quantity: 2 Area: (cm) 0.817 Volume: (cm) 0.082 % Reduction in Area: 0% % Reduction in Volume: 0% Epithelialization: None Tunneling: No Undermining: No Wound Description Classification: Category/Stage II Wound Margin: Flat and Intact Exudate Amount: Medium Exudate Type: Serosanguineous Exudate Color: red, brown Foul Odor After Cleansing: No Slough/Fibrino No Wound Bed Granulation Amount: Large (67-100%) Exposed Structure Granulation Quality: Red, Pink Fascia Exposed: No Necrotic Amount: None Present (0%) Fat Layer (Subcutaneous Tissue) Exposed: Yes Tendon Exposed: No Muscle Exposed: No Joint Exposed: No Bone Exposed: No Treatment Notes Wound #4 (Sacrum) Wound Laterality: Proximal Cleanser Wound Cleanser Discharge Instruction: Cleanse the wound with wound cleanser prior to applying a clean dressing using gauze sponges, not tissue or cotton balls. Peri-Wound Care Topical Primary Dressing Hydrofera Blue Classic Foam, 2x2 in Discharge Instruction: Moisten with saline prior to applying to wound bed Secondary Dressing Woven Gauze Sponge, Non-Sterile 4x4 in Discharge Instruction: Apply over primary dressing as directed. Zetuvit Plus Silicone Border Dressing 4x4 (in/in) Discharge Instruction: Apply silicone border over primary dressing as directed. Secured With Compression Wrap Compression Stockings Environmental education officer) Signed: 06/26/2021 5:42:59 PM By: Dellie Catholic RN Signed: 06/30/2021 3:59:15 PM By: Levan Hurst RN, BSN Entered By: Dellie Catholic on 06/26/2021 16:07:06 -------------------------------------------------------------------------------- [place] Details Patient Name: Date of Service: [place], [name] 06/26/2021 3:00 PM Medical Record Number: [id number] Patient Account Number: [id number] Date of  Birth/Sex: Treating RN: [birth year] (70 y.o. [name] Primary Care Keiana Tavella: Caprice Renshaw Other Clinician: Referring Aerilyn Slee: Treating Eunique Balik/Extender: Heath Lark in Treatment: 6 Vital Signs Time Taken: 15:29 Temperature (F): 98.7 Pulse (bpm): 81 Respiratory Rate (breaths/min): 16 Blood Pressure (mmHg): 109/66 Reference Range: 80 - 120 mg / dl Electronic Signature(s) Signed: 06/30/2021 3:59:15 PM By: Levan Hurst RN, BSN Entered By: Levan Hurst on 06/26/2021 15:30:11

## 2021-07-01 ENCOUNTER — Other Ambulatory Visit: Payer: Self-pay | Admitting: Radiology
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
## 2021-07-01 NOTE — Progress Notes (Signed)
COVID Vaccine Completed:Yes Date COVID Vaccine completed: x2 Has received booster:x1 COVID vaccine manufacturer:    Moderna    Date of COVID positive in last 90 days: No   PCP - Caprice Renshaw, MD, last office note from Dr. Jerilynn Mages. [place] in epic 02/07/2021 Cardiologist - N/A   Chest x-ray - 02/05/2021 in epic EKG - 02/05/2021 in epic Stress Test - N/A ECHO - 06/29/2017 in epic Cardiac Cath - 07/02/2017 in epic Pacemaker/ICD device last checked:N/A   Sleep Study - N/A CPAP - N/A   Fasting Blood Sugar - N/A Checks Blood Sugar __N/A___ times a day   Blood Thinner Instructions:N/A Aspirin Instructions: yes Last Dose: 03/01/2021   Activity level: Paraplegic                            Anesthesia review: CHF, Paraplegic, CKD   Patient denies shortness of breath, fever, cough and chest pain at PAT appointment     Patient verbalized understanding of instructions that were given to them at the PAT appointment. Patient was also instructed that they will need to review over the PAT instructions again at home before surgery.

## 2021-07-01 NOTE — Progress Notes (Deleted)
Spoke with [facility] staff [phone]) and Pt's sister who are all in agrrement to have Pt here as close to [place] as possible 07/02/21.  Pt's sister,  will transport her in her Lucianne Lei.

## 2021-07-01 NOTE — Progress Notes (Signed)
Spoke with [facility] staff and Pt's sister, Collene Mares,  who are all in agreement to have Pt here as close to 0615 as possible.  Her sister will transport her in her Lucianne Lei.

## 2021-07-01 NOTE — Progress Notes (Signed)
Preop instructions for: Charolett Yarrow  Date of Birth: [birth year]                      Date of Procedure:   07/02/2021 Procedure:   CT MICROWAVE ABLATION LIVER   Surgeon: Dr. Corrie Mckusick Facility contact: E. Lopez at [facility]     Phone:  [phone]               Health Care MAY:[id number] [phone] RN contact name/phone#:                          and Fax #: [phone]   Transportation contact phone#: Luis Lopez at [place]      Time to arrive at [facility]: 5:15 AM   Report to: Admitting (On your left hand side)    Do not eat or drink past midnight the night before your procedure.(To include any tube feedings-must be discontinued)   Take these morning medications only with sips of water.(or give through gastrostomy or feeding tube). Baclofen, Carvedilol, Senna, Linzess Sertraline, Omeprazole Oxybutynin, Tizanidine, Bring Inhalers   Please send day of procedure:current med list and meds last taken that day, confirm nothing by mouth status from what time, Patient Demographic info( to include DNR status, problem list, allergies)   Bring Insurance card and picture ID Leave all jewelry and other valuables at place where living( no metal or rings to be worn) No contact lens Women-no make-up, no lotions,perfumes,powders   Any questions day of procedure,call  [facility]-[phone]     Sent from :[facility]                   Phone:[phone]                   Fax:[phone]   Sent by :   Harlon Flor BSN, RN

## 2021-07-01 NOTE — Anesthesia Preprocedure Evaluation (Addendum)
Anesthesia Evaluation  Patient identified by MRN, date of birth, ID band Patient awake    Reviewed: Allergy & Precautions, NPO status , Patient's Chart, lab work & pertinent test results  Airway Mallampati: II  TM Distance: >3 FB Neck ROM: Full    Dental  (+) Dental Advisory Given   Pulmonary former smoker,    breath sounds clear to auscultation       Cardiovascular hypertension, Pt. on medications and Pt. on home beta blockers +CHF   Rhythm:Regular Rate:Normal  EF 45-50%   Neuro/Psych  Neuromuscular disease (paraplegic x 44 years)    GI/Hepatic hiatal hernia, GERD  ,(+) Hepatitis -, [facility]   Endo/Other  negative endocrine ROS  Renal/GU CRFRenal disease     Musculoskeletal  (+) Arthritis ,   Abdominal   Peds  Hematology  (+) anemia ,   Anesthesia Other Findings   Reproductive/Obstetrics                            Lab Results  Component Value Date   WBC 9.3 07/02/2021   HGB 13.0 07/02/2021   HCT 41.0 07/02/2021   MCV 78.1 (L) 07/02/2021   PLT 319 07/02/2021   Lab Results  Component Value Date   CREATININE 0.64 07/02/2021   BUN 36 (H) 07/02/2021   NA 133 (L) 07/02/2021   K 4.5 07/02/2021   CL 100 07/02/2021   CO2 23 07/02/2021    Anesthesia Physical Anesthesia Plan  ASA: 3  Anesthesia Plan: General   Post-op Pain Management: Tylenol PO (pre-op) and Minimal or no pain anticipated   Induction:   PONV Risk Score and Plan: 3 and Dexamethasone, Ondansetron and Treatment may vary due to age or medical condition  Airway Management Planned: Oral ETT  Additional Equipment: None  Intra-op Plan:   Post-operative Plan: Extubation in OR  Informed Consent: I have reviewed the patients History and Physical, chart, labs and discussed the procedure including the risks, benefits and alternatives for the proposed anesthesia with the patient or authorized representative who has  indicated his/her understanding and acceptance.     Dental advisory given  Plan Discussed with:   Anesthesia Plan Comments:        Anesthesia Quick Evaluation

## 2021-07-02 ENCOUNTER — Encounter (HOSPITAL_COMMUNITY)
Admission: RE | Disposition: A | Discharge status: Home / Self Care | Payer: Self-pay | Source: Home / Self Care | Attending: Interventional Radiology

## 2021-07-02 ENCOUNTER — Ambulatory Visit (HOSPITAL_COMMUNITY)
Admission: RE | Admit: 2021-07-02 | Discharge: 2021-07-03 | Disposition: A | Discharge status: Home / Self Care | Payer: Medicare Other | Attending: Interventional Radiology | Admitting: Interventional Radiology

## 2021-07-02 ENCOUNTER — Ambulatory Visit (HOSPITAL_COMMUNITY): Payer: Medicare Other

## 2021-07-02 ENCOUNTER — Ambulatory Visit (HOSPITAL_COMMUNITY)
Admission: RE | Admit: 2021-07-02 | Discharge: 2021-07-02 | Disposition: A | Discharge status: Home / Self Care | Payer: Medicare Other | Source: Ambulatory Visit | Attending: Interventional Radiology | Admitting: Interventional Radiology

## 2021-07-02 ENCOUNTER — Ambulatory Visit (HOSPITAL_COMMUNITY)
Admission: RE | Admit: 2021-07-02 | Discharge: 2021-07-02 | Disposition: A | Discharge status: Home / Self Care | Payer: Medicare Other | Source: Home / Self Care | Attending: Interventional Radiology | Admitting: Interventional Radiology

## 2021-07-02 ENCOUNTER — Encounter (HOSPITAL_COMMUNITY): Payer: Self-pay | Admitting: Interventional Radiology

## 2021-07-02 ENCOUNTER — Ambulatory Visit (HOSPITAL_COMMUNITY): Payer: Medicare Other | Admitting: Certified Registered Nurse Anesthetist

## 2021-07-02 DIAGNOSIS — R160 Hepatomegaly, not elsewhere classified: Secondary | ICD-10-CM

## 2021-07-02 DIAGNOSIS — Z01818 Encounter for other preprocedural examination: Secondary | ICD-10-CM

## 2021-07-02 DIAGNOSIS — C220 Liver cell carcinoma: Secondary | ICD-10-CM | POA: Insufficient documentation

## 2021-07-02 DIAGNOSIS — N189 Chronic kidney disease, unspecified: Secondary | ICD-10-CM | POA: Insufficient documentation

## 2021-07-02 DIAGNOSIS — I130 Hypertensive heart and chronic kidney disease with heart failure and stage 1 through stage 4 chronic kidney disease, or unspecified chronic kidney disease: Secondary | ICD-10-CM | POA: Diagnosis not present

## 2021-07-02 DIAGNOSIS — Z20822 Contact with and (suspected) exposure to covid-19: Secondary | ICD-10-CM | POA: Insufficient documentation

## 2021-07-02 DIAGNOSIS — I509 Heart failure, unspecified: Secondary | ICD-10-CM | POA: Insufficient documentation

## 2021-07-02 HISTORY — PX: RADIOLOGY WITH ANESTHESIA: SHX6223

## 2021-07-02 LAB — COMPREHENSIVE METABOLIC PANEL
ALT: 49 U/L — ABNORMAL HIGH (ref 0–44)
AST: 23 U/L (ref 15–41)
Albumin: 4.2 g/dL (ref 3.5–5.0)
Alkaline Phosphatase: 128 U/L — ABNORMAL HIGH (ref 38–126)
Anion gap: 10 (ref 5–15)
BUN: 36 mg/dL — ABNORMAL HIGH (ref 8–23)
CO2: 23 mmol/L (ref 22–32)
Calcium: 9.8 mg/dL (ref 8.9–10.3)
Chloride: 100 mmol/L (ref 98–111)
Creatinine, Ser: 0.64 mg/dL (ref 0.44–1.00)
GFR, Estimated: >60 mL/min (ref >60)
Glucose, Bld: 102 mg/dL — ABNORMAL HIGH (ref 70–99)
Potassium: 4.5 mmol/L (ref 3.5–5.1)
Sodium: 133 mmol/L — ABNORMAL LOW (ref 135–145)
Total Bilirubin: 0.6 mg/dL (ref 0.3–1.2)
Total Protein: 9.7 g/dL — ABNORMAL HIGH (ref 6.5–8.1)

## 2021-07-02 LAB — TYPE AND SCREEN
ABO/RH(D): B POS
Antibody Screen: NEGATIVE

## 2021-07-02 LAB — CBC
HCT: 41 % (ref 36.0–46.0)
Hemoglobin: 13 g/dL (ref 12.0–15.0)
MCH: 24.8 pg — ABNORMAL LOW (ref 26.0–34.0)
MCHC: 31.7 g/dL (ref 30.0–36.0)
MCV: 78.1 fL — ABNORMAL LOW (ref 80.0–100.0)
Platelets: 319 10*3/uL (ref 150–400)
RBC: 5.25 MIL/uL — ABNORMAL HIGH (ref 3.87–5.11)
RDW: 17.3 % — ABNORMAL HIGH (ref 11.5–15.5)
WBC: 9.3 10*3/uL (ref 4.0–10.5)
nRBC: 0 % (ref 0.0–0.2)

## 2021-07-02 LAB — PROTIME-INR
INR: 1.1 (ref 0.8–1.2)
Prothrombin Time: 14.1 seconds (ref 11.4–15.2)

## 2021-07-02 LAB — SARS CORONAVIRUS 2 BY RT PCR (HOSPITAL ORDER, PERFORMED IN ~~LOC~~ HOSPITAL LAB): SARS Coronavirus 2: NEGATIVE

## 2021-07-02 LAB — APTT: aPTT: 30 seconds (ref 24–36)

## 2021-07-02 SURGERY — CT WITH ANESTHESIA
Anesthesia: General

## 2021-07-02 MED ORDER — ACETAMINOPHEN 500 MG PO TABS
1000.0000 mg | ORAL_TABLET | Freq: Once | ORAL | Status: AC
  Administered 2021-07-02: 1000 mg via ORAL
  Filled 2021-07-02: qty 2

## 2021-07-02 MED ORDER — PROPOFOL 10 MG/ML IV BOLUS
INTRAVENOUS | Status: DC | PRN
  Administered 2021-07-02: 100 mg via INTRAVENOUS

## 2021-07-02 MED ORDER — SODIUM CHLORIDE (PF) 0.9 % IJ SOLN
INTRAMUSCULAR | Status: AC
  Filled 2021-07-02: qty 50

## 2021-07-02 MED ORDER — CARVEDILOL 12.5 MG PO TABS
12.5000 mg | ORAL_TABLET | Freq: Two times a day (BID) | ORAL | Status: DC
  Administered 2021-07-02 – 2021-07-03 (×3): 12.5 mg via ORAL
  Filled 2021-07-02 (×3): qty 1

## 2021-07-02 MED ORDER — TRAMADOL HCL 50 MG PO TABS: 50.0000 mg | ORAL_TABLET | Freq: Four times a day (QID) | ORAL | Status: DC | PRN

## 2021-07-02 MED ORDER — LORATADINE 10 MG PO TABS
10.0000 mg | ORAL_TABLET | Freq: Every day | ORAL | Status: DC
  Administered 2021-07-02 – 2021-07-03 (×2): 10 mg via ORAL
  Filled 2021-07-02 (×2): qty 1

## 2021-07-02 MED ORDER — SUCRALFATE 1 GM/10ML PO SUSP
1.0000 g | Freq: Four times a day (QID) | ORAL | Status: DC | PRN
  Administered 2021-07-02: 1 g via ORAL
  Filled 2021-07-02: qty 10

## 2021-07-02 MED ORDER — PHENYLEPHRINE HCL-NACL 20-0.9 MG/250ML-% IV SOLN
INTRAVENOUS | Status: DC | PRN
  Administered 2021-07-02: 35 ug/min via INTRAVENOUS

## 2021-07-02 MED ORDER — PANTOPRAZOLE SODIUM 40 MG PO TBEC: 40.0000 mg | DELAYED_RELEASE_TABLET | Freq: Every day | ORAL | Status: DC

## 2021-07-02 MED ORDER — FENTANYL CITRATE PF 50 MCG/ML IJ SOSY
PREFILLED_SYRINGE | INTRAMUSCULAR | Status: AC
  Administered 2021-07-02: 50 ug via INTRAVENOUS
  Filled 2021-07-02: qty 2

## 2021-07-02 MED ORDER — MIDAZOLAM HCL 5 MG/5ML IJ SOLN
INTRAMUSCULAR | Status: DC | PRN
Start: 2021-07-02 — End: 2021-07-02
  Administered 2021-07-02: 1 mg via INTRAVENOUS

## 2021-07-02 MED ORDER — LOSARTAN POTASSIUM 50 MG PO TABS
25.0000 mg | ORAL_TABLET | Freq: Every day | ORAL | Status: DC
  Administered 2021-07-02 – 2021-07-03 (×2): 25 mg via ORAL
  Filled 2021-07-02 (×2): qty 1

## 2021-07-02 MED ORDER — ALBUTEROL SULFATE HFA 108 (90 BASE) MCG/ACT IN AERS: 2.0000 | INHALATION_SPRAY | RESPIRATORY_TRACT | Status: DC | PRN

## 2021-07-02 MED ORDER — MELATONIN 3 MG PO TABS: 3.0000 mg | ORAL_TABLET | Freq: Every evening | ORAL | Status: DC | PRN

## 2021-07-02 MED ORDER — TIZANIDINE HCL 4 MG PO TABS
4.0000 mg | ORAL_TABLET | Freq: Three times a day (TID) | ORAL | Status: DC
  Administered 2021-07-02 – 2021-07-03 (×4): 4 mg via ORAL
  Filled 2021-07-02 (×4): qty 1

## 2021-07-02 MED ORDER — EPHEDRINE SULFATE-NACL 50-0.9 MG/10ML-% IV SOSY
PREFILLED_SYRINGE | INTRAVENOUS | Status: DC | PRN
  Administered 2021-07-02 (×2): 5 mg via INTRAVENOUS

## 2021-07-02 MED ORDER — LACTATED RINGERS IV SOLN: INTRAVENOUS | Status: DC

## 2021-07-02 MED ORDER — MIDAZOLAM HCL 2 MG/2ML IJ SOLN
INTRAMUSCULAR | Status: AC
  Filled 2021-07-02: qty 2

## 2021-07-02 MED ORDER — SERTRALINE HCL 50 MG PO TABS
50.0000 mg | ORAL_TABLET | Freq: Every day | ORAL | Status: DC
  Administered 2021-07-02 – 2021-07-03 (×2): 50 mg via ORAL
  Filled 2021-07-02 (×2): qty 1

## 2021-07-02 MED ORDER — ALBUTEROL SULFATE (2.5 MG/3ML) 0.083% IN NEBU: 2.5000 mg | INHALATION_SOLUTION | RESPIRATORY_TRACT | Status: DC | PRN

## 2021-07-02 MED ORDER — PIPERACILLIN-TAZOBACTAM 3.375 G IVPB
3.3750 g | Freq: Once | INTRAVENOUS | Status: AC
  Administered 2021-07-02: 3.375 g via INTRAVENOUS
  Filled 2021-07-02: qty 50

## 2021-07-02 MED ORDER — ONDANSETRON HCL 4 MG/2ML IJ SOLN
4.0000 mg | Freq: Once | INTRAMUSCULAR | Status: DC
  Filled 2021-07-02: qty 2

## 2021-07-02 MED ORDER — CARVEDILOL 12.5 MG PO TABS: 12.5000 mg | ORAL_TABLET | Freq: Two times a day (BID) | ORAL | Status: DC

## 2021-07-02 MED ORDER — BACLOFEN 10 MG PO TABS
10.0000 mg | ORAL_TABLET | Freq: Two times a day (BID) | ORAL | Status: DC
  Administered 2021-07-02 – 2021-07-03 (×3): 10 mg via ORAL
  Filled 2021-07-02 (×2): qty 1
  Filled 2021-07-02: qty 2
  Filled 2021-07-02: qty 1

## 2021-07-02 MED ORDER — SPIRONOLACTONE 100 MG PO TABS
100.0000 mg | ORAL_TABLET | Freq: Every day | ORAL | Status: DC
  Administered 2021-07-02 – 2021-07-03 (×2): 100 mg via ORAL
  Filled 2021-07-02 (×2): qty 1

## 2021-07-02 MED ORDER — FENTANYL CITRATE PF 50 MCG/ML IJ SOSY
25.0000 ug | PREFILLED_SYRINGE | INTRAMUSCULAR | Status: DC | PRN
  Administered 2021-07-02: 50 ug via INTRAVENOUS

## 2021-07-02 MED ORDER — SENNOSIDES-DOCUSATE SODIUM 8.6-50 MG PO TABS: 1.0000 | ORAL_TABLET | Freq: Every day | ORAL | Status: DC | PRN

## 2021-07-02 MED ORDER — DEXAMETHASONE SODIUM PHOSPHATE 10 MG/ML IJ SOLN
INTRAMUSCULAR | Status: DC | PRN
Start: 2021-07-02 — End: 2021-07-02
  Administered 2021-07-02: 10 mg via INTRAVENOUS

## 2021-07-02 MED ORDER — AMISULPRIDE (ANTIEMETIC) 5 MG/2ML IV SOLN: 10.0000 mg | Freq: Once | INTRAVENOUS | Status: DC | PRN

## 2021-07-02 MED ORDER — FENTANYL CITRATE (PF) 100 MCG/2ML IJ SOLN
INTRAMUSCULAR | Status: DC | PRN
  Administered 2021-07-02 (×2): 25 ug via INTRAVENOUS
  Administered 2021-07-02: 100 ug via INTRAVENOUS
  Administered 2021-07-02 (×2): 25 ug via INTRAVENOUS

## 2021-07-02 MED ORDER — PHENYLEPHRINE 40 MCG/ML (10ML) SYRINGE FOR IV PUSH (FOR BLOOD PRESSURE SUPPORT)
PREFILLED_SYRINGE | INTRAVENOUS | Status: DC | PRN
  Administered 2021-07-02 (×2): 80 ug via INTRAVENOUS

## 2021-07-02 MED ORDER — FENTANYL CITRATE (PF) 250 MCG/5ML IJ SOLN
INTRAMUSCULAR | Status: AC
  Filled 2021-07-02: qty 5

## 2021-07-02 MED ORDER — LIDOCAINE 2% (20 MG/ML) 5 ML SYRINGE
INTRAMUSCULAR | Status: DC | PRN
  Administered 2021-07-02: 60 mg via INTRAVENOUS

## 2021-07-02 MED ORDER — CARVEDILOL 6.25 MG PO TABS: 6.2500 mg | ORAL_TABLET | Freq: Two times a day (BID) | ORAL | Status: DC

## 2021-07-02 MED ORDER — ONDANSETRON HCL 4 MG/2ML IJ SOLN
INTRAMUSCULAR | Status: DC | PRN
  Administered 2021-07-02: 4 mg via INTRAVENOUS

## 2021-07-02 MED ORDER — ATORVASTATIN CALCIUM 40 MG PO TABS
40.0000 mg | ORAL_TABLET | Freq: Every day | ORAL | Status: DC
  Administered 2021-07-02 – 2021-07-03 (×2): 40 mg via ORAL
  Filled 2021-07-02 (×2): qty 1

## 2021-07-02 MED ORDER — IOHEXOL 350 MG/ML SOLN: 100.0000 mL | Freq: Once | INTRAVENOUS | Status: DC | PRN

## 2021-07-02 MED ORDER — DOCUSATE SODIUM 100 MG PO CAPS
100.0000 mg | ORAL_CAPSULE | Freq: Two times a day (BID) | ORAL | Status: DC
  Administered 2021-07-02 – 2021-07-03 (×3): 100 mg via ORAL
  Filled 2021-07-02 (×3): qty 1

## 2021-07-02 MED ORDER — BENZONATATE 100 MG PO CAPS
100.0000 mg | ORAL_CAPSULE | Freq: Three times a day (TID) | ORAL | Status: DC
  Administered 2021-07-02 – 2021-07-03 (×3): 100 mg via ORAL
  Filled 2021-07-02 (×3): qty 1

## 2021-07-02 MED ORDER — SUGAMMADEX SODIUM 200 MG/2ML IV SOLN
INTRAVENOUS | Status: DC | PRN
  Administered 2021-07-02: 300 mg via INTRAVENOUS

## 2021-07-02 MED ORDER — HYDROCODONE-ACETAMINOPHEN 5-325 MG PO TABS
1.0000 | ORAL_TABLET | ORAL | Status: DC | PRN
  Administered 2021-07-03: 1 via ORAL
  Filled 2021-07-02: qty 1

## 2021-07-02 MED ORDER — ONDANSETRON HCL 4 MG/2ML IJ SOLN
4.0000 mg | Freq: Four times a day (QID) | INTRAMUSCULAR | Status: DC | PRN
  Administered 2021-07-02: 4 mg via INTRAVENOUS
  Filled 2021-07-02: qty 2

## 2021-07-02 MED ORDER — ROCURONIUM BROMIDE 10 MG/ML (PF) SYRINGE
PREFILLED_SYRINGE | INTRAVENOUS | Status: DC | PRN
  Administered 2021-07-02: 20 mg via INTRAVENOUS
  Administered 2021-07-02: 50 mg via INTRAVENOUS
  Administered 2021-07-02: 20 mg via INTRAVENOUS

## 2021-07-02 MED ORDER — BACLOFEN 10 MG PO TABS
15.0000 mg | ORAL_TABLET | Freq: Two times a day (BID) | ORAL | Status: DC
  Administered 2021-07-02 – 2021-07-03 (×2): 15 mg via ORAL
  Filled 2021-07-02 (×2): qty 2

## 2021-07-02 MED ORDER — POLYETHYLENE GLYCOL 3350 17 G PO PACK
17.0000 g | PACK | Freq: Two times a day (BID) | ORAL | Status: DC
  Administered 2021-07-02 – 2021-07-03 (×3): 17 g via ORAL
  Filled 2021-07-02 (×3): qty 1

## 2021-07-02 MED ORDER — AMLODIPINE BESYLATE 5 MG PO TABS
5.0000 mg | ORAL_TABLET | Freq: Every day | ORAL | Status: DC
  Administered 2021-07-03: 5 mg via ORAL
  Filled 2021-07-02: qty 1

## 2021-07-02 MED ORDER — PANTOPRAZOLE SODIUM 40 MG PO TBEC
40.0000 mg | DELAYED_RELEASE_TABLET | Freq: Two times a day (BID) | ORAL | Status: DC
  Administered 2021-07-02 – 2021-07-03 (×3): 40 mg via ORAL
  Filled 2021-07-02 (×3): qty 1

## 2021-07-02 MED ORDER — CARVEDILOL 6.25 MG PO TABS
6.2500 mg | ORAL_TABLET | Freq: Two times a day (BID) | ORAL | Status: DC
  Administered 2021-07-02 – 2021-07-03 (×3): 6.25 mg via ORAL
  Filled 2021-07-02 (×3): qty 1

## 2021-07-02 NOTE — Sedation Documentation (Signed)
Anesthesia in to sedate and monitor. 

## 2021-07-02 NOTE — Progress Notes (Signed)
Interventional Radiology Postop Note  70 yo female SP image guided ablation of right liver lesion, with biopsy, GETA.   She has no complaints, and says that while she had some "back pain" afterwards, this is improving, and not currently present.   She has been tolerating diet.   No TTP of the RUQ, with dry dressing intact.   Afebrile, 132/74, HR 75.   Plan: 23 hr obs VIR will follow up in am, with anticipated DC in am. Clinic visit in ~4 weeks with Dr. Earleen Newport   Signed,  Dulcy Fanny. Earleen Newport, DO

## 2021-07-02 NOTE — Anesthesia Postprocedure Evaluation (Signed)
Anesthesia Post Note  Patient: [name]  Procedure(s) Performed: CT MICROWAVE ABLATION WITH ANESTHESIA     Patient location during evaluation: PACU Anesthesia Type: General Level of consciousness: awake and alert Pain management: pain level controlled Vital Signs Assessment: post-procedure vital signs reviewed and stable Respiratory status: spontaneous breathing, nonlabored ventilation, respiratory function stable and patient connected to nasal cannula oxygen Cardiovascular status: blood pressure returned to baseline and stable Postop Assessment: no apparent nausea or vomiting Anesthetic complications: no   No notable events documented.  Last Vitals:  Vitals:   07/02/21 1416 07/02/21 1445  BP: 123/61 132/74  Pulse: 69 75  Resp: 16 14  Temp: 36.5 C 36.9 C  SpO2: 95% 94%    Last Pain:  Vitals:   07/02/21 1445  TempSrc: Oral  PainSc:                  Tiajuana Amass

## 2021-07-02 NOTE — Anesthesia Procedure Notes (Signed)
Procedure Name: Intubation Date/Time: 07/02/2021 9:00 AM Performed by: Maxwell Caul, CRNA Pre-anesthesia Checklist: Patient identified, Emergency Drugs available, Suction available and Patient being monitored Patient Re-evaluated:Patient Re-evaluated prior to induction Oxygen Delivery Method: Circle system utilized Preoxygenation: Pre-oxygenation with 100% oxygen Induction Type: IV induction Ventilation: Mask ventilation without difficulty Laryngoscope Size: Mac and 4 Grade View: Grade I Tube type: Oral Tube size: 7.0 mm Number of attempts: 1 Airway Equipment and Method: Stylet Placement Confirmation: ETT inserted through vocal cords under direct vision, positive ETCO2 and breath sounds checked- equal and bilateral Secured at: 21 cm Tube secured with: Tape Dental Injury: Teeth and Oropharynx as per pre-operative assessment

## 2021-07-02 NOTE — Plan of Care (Signed)

## 2021-07-02 NOTE — Procedures (Signed)
Interventional Radiology Procedure Note  Procedure:  Image guided tissue ablation, microwave, right hepatic tumor, with intra-op biopsy  Specimen: Biopsy, suspect HCC  EBL: 0 Anesthesia: GETA .  Complications: None Recommendations:  - To PACU - Anticipate 23 hr obs - Ice prn - advance diet as tolerated - local wound care - follow up path - Do not submerge for 7 days - VIR admission  Signed,  Keshauna Degraffenreid S. Earleen Newport, DO

## 2021-07-02 NOTE — Transfer of Care (Signed)
Immediate Anesthesia Transfer of Care Note  Patient: SHAMECKA HOCUTT  Procedure(s) Performed: CT MICROWAVE ABLATION WITH ANESTHESIA  Patient Location: PACU  Anesthesia Type:General  Level of Consciousness: awake, alert  and oriented  Airway & Oxygen Therapy: Patient Spontanous Breathing and Patient connected to face mask oxygen  Post-op Assessment: Report given to RN and Post -op Vital signs reviewed and stable  Post vital signs: Reviewed and stable  Last Vitals:  Vitals Value Taken Time  BP    Temp    Pulse 100 07/02/21 1102  Resp 19 07/02/21 1102  SpO2 100 % 07/02/21 1102  Vitals shown include unvalidated device data.  Last Pain:  Vitals:   07/02/21 0642  TempSrc: Oral         Complications: No notable events documented.

## 2021-07-03 ENCOUNTER — Encounter (HOSPITAL_COMMUNITY): Payer: Self-pay | Admitting: Interventional Radiology

## 2021-07-03 DIAGNOSIS — C220 Liver cell carcinoma: Secondary | ICD-10-CM | POA: Diagnosis not present

## 2021-07-03 LAB — SURGICAL PATHOLOGY

## 2021-07-03 NOTE — Discharge Instructions (Signed)
Alblation of Hepatic Tumors, Care After  This sheet gives you information about how to care for yourself after your procedure. Your health care provider may also give you more specific instructions. If you have problems or questions, contact your health care provider.  What can I expect after the procedure? After your procedure, it is common to have pain and discomfort in the upper abdomen. You may be given prescription pain medicine to control this if the pain is severe.  Follow these instructions at home: Puncture site care Follow instructions from your health care provider about how to take care of your incision. Make sure you:  - Wash your hands with soap and water before you change your bandage (dressing). If soap and water are not available, use hand sanitizer.  - Ok to shower 48 hours following procedure. Recommend showering with bandage on, remove bandage immediately after showering and pat area dry. No further dressing changes needed after this- ensure area remains clean and dry until fully healed.  - No submerging (swimming, bathing) for 7 days post-procedure. Check your incision area every day for signs of infection. Check for: - Redness, swelling, or pain. - Fluid or blood. - Warmth. - Pus or a bad smell. Activity Rest as often as needing during the first few days of recovery. No stooping, bending, or lifting more than 10 pounds for 1 week.  General instructions To prevent or treat constipation while you are taking prescription pain medicine, your health care provider may recommend that you: - Drink enough fluid to keep your urine clear or pale yellow. - Take over-the-counter or prescription medicines. - Eat foods that are high in fiber, such as fresh fruits and vegetables, whole grains, and beans. - Limit foods that are high in fat and processed sugars, such as fried and sweet foods. - Do not use any products that contain nicotine or tobacco, such as cigarettes and  e-cigarettes. If you need help quitting, ask your health care provider. - Keep all follow-up visits as told by your health care provider. This is important.  3-4 week televisit with the doctor who performed the procedure. Our office will call you to set up the appointment.    Contact a health care provider if: You cannot pass gas. You are unable to have a bowel movement within 3 days. You have a skin rash.  Get help right away if: You have a fever. You have severe or lasting pain in your abdomen, shoulder, or back. You have trouble swallowing or breathing. You have severe weakness or dizziness. You have chest pain or shortness of breath.   This information is not intended to replace advice given to you by your health care provider. Make sure you discuss any questions you have with your health care provider.

## 2021-07-03 NOTE — Discharge Summary (Signed)
Patient ID: [name] MRN: [id number] DOB/AGE: 12/09/1950 70 y.o.  Admit date: 07/02/2021 Discharge date: 07/03/2021  Supervising Physician: Markus Daft  Patient Status: [facility] - In-pt  Admission Diagnoses: Liver mass  Discharge Diagnoses:  Principal Problem:   Hepatocellular carcinoma [facility])   Discharged Condition: good  Hospital Course:   Patient presented to [facility] IR for an image-guided hepatic mass ablation with Dr. Earleen Newport. Procedure occurred without major complications and patient was transferred to floor in stable condition (VSS, puncture site stable) for overnight observation. No major events occurred overnight.   Patient awake and alert, no complaints at this time.  Patient reports sore throat and RUQ soreness. Puncture site stable. Reports she was informed by RN that her BP was little low, pt asymptomatic. Plan to discharge home today and follow-up with Dr. Earleen Newport for televisit 3-4 weeks after discharge.   Consults: None  Significant Diagnostic Studies: none   Treatments: Liver mass ablation   Discharge Exam: Blood pressure (!) 91/43, pulse 78, temperature 97.8 F (36.6 C), temperature source Oral, resp. rate 16, height 5' 5"  (1.651 m), weight 162 lb 0.6 oz (73.5 kg), SpO2 96 %.  Physical Exam Vitals and nursing note reviewed.  Constitutional:      General: Patient is not in acute distress.    Appearance: Normal appearance. Patient is not ill-appearing.  HENT:     Head: Normocephalic and atraumatic.     Mouth/Throat:     Mouth: Mucous membranes are moist.     Pharynx: Oropharynx is clear.  Cardiovascular:     Rate and Rhythm: Normal rate and regular rhythm.     Pulses: Normal pulses.     Heart sounds: Normal heart sounds.  Pulmonary:     Effort: Pulmonary effort is normal.     Breath sounds: Normal breath sounds.  Abdominal:     General: Abdomen is flat.     Palpations: Abdomen is soft.  Musculoskeletal:     Cervical back: Neck supple.  Skin:     General: Skin is warm and dry.     Coloration: Skin is not jaundiced or pale.  Positive dressing on RUQ puncture site. Site is unremarkable with no erythema, edema, tenderness, bleeding or drainage. Minimal amount of old, dry blood noted on the dressing. Dressing otherwise clean, dry, and intact.   Neurological:     Mental Status: Patient is alert and oriented to person, place, and time.  Psychiatric:        Mood and Affect: Mood normal.        Behavior: Behavior normal.        Judgment: Judgment normal.    Disposition: Discharge disposition: 01-Home or Self Care       Discharge Instructions     Call MD for:   Complete by: As directed    Call MD for:  difficulty breathing, headache or visual disturbances   Complete by: As directed    Call MD for:  extreme fatigue   Complete by: As directed    Call MD for:  hives   Complete by: As directed    Call MD for:  persistant dizziness or light-headedness   Complete by: As directed    Call MD for:  persistant nausea and vomiting   Complete by: As directed    Call MD for:  redness, tenderness, or signs of infection (pain, swelling, redness, odor or green/yellow discharge around incision site)   Complete by: As directed    Call MD for:  severe uncontrolled pain   Complete by: As directed    Call MD for:  temperature >100.4   Complete by: As directed    Diet - low sodium heart healthy   Complete by: As directed    Discharge wound care:   Complete by: As directed    No further dressing changed needed, keep the puncture site clean and dry. No submerging x 7 days.   Increase activity slowly   Complete by: As directed    Lifting restrictions   Complete by: As directed    No lifting, bending, stopping more than 10 pounds for 1 week.      Allergies as of 07/03/2021   No Known Allergies      Medication List     TAKE these medications    acetaminophen 325 MG tablet Commonly known as: TYLENOL Take 650 mg by mouth every 6  (six) hours as needed for mild pain or fever.   albuterol 108 (90 Base) MCG/ACT inhaler Commonly known as: VENTOLIN HFA Inhale 2 puffs into the lungs every 4 (four) hours as needed for wheezing or shortness of breath.   alum & mag hydroxide-simeth 329-924-26 MG/5ML suspension Commonly known as: MAALOX PLUS Take 10 mLs by mouth every 6 (six) hours as needed for indigestion (nausea, Gas).   amLODipine 5 MG tablet Commonly known as: NORVASC Take 5 mg by mouth daily.   atorvastatin 40 MG tablet Commonly known as: Lipitor Take 1 tablet (40 mg total) by mouth daily.   baclofen 10 MG tablet Commonly known as: LIORESAL Take 10 mg by mouth 2 (two) times daily. In addition to 58m given twice daily at different times. Takes Baclofen a total of four times daily.   Baclofen 5 MG Tabs Take 15 mg by mouth in the morning and at bedtime. In addition to 110mgiven twice daily at different times. Takes Baclofen a total of four times daily.   Benzocaine 10 MG Lozg Use as directed 1 lozenge in the mouth or throat every 2 (two) hours as needed (sore throat).   benzonatate 100 MG capsule Commonly known as: TESSALON Take 1 capsule (100 mg total) by mouth every 8 (eight) hours.   bisacodyl 10 MG suppository Commonly known as: DULCOLAX Place 1 suppository (10 mg total) rectally every Tuesday, Thursday, Saturday, and Sunday at 6 PM.   calcium-vitamin D 500-200 MG-UNIT tablet Commonly known as: OSCAL WITH D Take 1 tablet by mouth 2 (two) times daily.   Carboxymethylcellulose Sod PF 0.5 % Soln Place 1 drop into both eyes in the morning, at noon, in the evening, and at bedtime.   carvedilol 12.5 MG tablet Commonly known as: COREG Take 1 tablet (12.5 mg total) by mouth 2 (two) times daily with a meal. What changed: additional instructions   carvedilol 6.25 MG tablet Commonly known as: COREG Take 1 tablet (6.25 mg total) by mouth 2 (two) times daily. Take with 12.5 to make 18.75 twice daily. What  changed: additional instructions   cetirizine 10 MG tablet Commonly known as: ZYRTEC Take 10 mg by mouth daily as needed for allergies.   cholecalciferol 1000 units tablet Commonly known as: VITAMIN D Take 1,000 Units by mouth daily.   Cranberry 450 MG Tabs Take 450 mg by mouth daily.   desonide 0.05 % ointment Commonly known as: DESOWEN Apply 1 application topically 2 (two) times daily as needed (lower lip dryness).   diclofenac Sodium 1 % Gel Commonly known as: VOLTAREN Apply 2 g topically  in the morning, at noon, and at bedtime. To bilateral shoulders   docusate sodium 100 MG capsule Commonly known as: COLACE Take 100 mg by mouth 3 (three) times daily as needed for mild constipation.   feeding supplement (PRO-STAT 64) Liqd Take 30 mLs by mouth 2 (two) times daily. For wound healing   furosemide 40 MG tablet Commonly known as: LASIX Take 40 mg by mouth 2 (two) times daily.   hydrocortisone 2.5 % lotion Apply 1 application topically every 2 (two) hours as needed (itching of arms and legs).   hydroquinone 2 % cream Apply 1 application topically 2 (two) times daily. To dark spots   hydroxychloroquine 200 MG tablet Commonly known as: PLAQUENIL Take 200 mg by mouth daily.   LORazepam 0.5 MG tablet Commonly known as: ATIVAN Take 0.5 mg by mouth daily.   losartan 25 MG tablet Commonly known as: COZAAR Take 25 mg by mouth daily.   lubiprostone 24 MCG capsule Commonly known as: AMITIZA Take 24 mcg by mouth 2 (two) times daily with a meal.   Lumify 0.025 % Soln Generic drug: Brimonidine Tartrate Place 1 drop into both eyes daily as needed (redness).   Melatonin 3 MG Tbdp Take 6 mg by mouth at bedtime as needed (sleep).   multivitamin with minerals Tabs tablet Take 1 tablet by mouth daily.   omeprazole 40 MG capsule Commonly known as: PRILOSEC Take 40 mg by mouth 2 (two) times daily.   oxybutynin 5 MG 24 hr tablet Commonly known as: DITROPAN-XL Take 5 mg  by mouth daily.   pantoprazole 40 MG tablet Commonly known as: PROTONIX Take 1 tablet (40 mg total) by mouth 2 (two) times daily.   polyethylene glycol 17 g packet Commonly known as: MIRALAX / GLYCOLAX Take 17 g by mouth every Monday, Wednesday, Friday, and Saturday at 6 PM. What changed: when to take this   prenatal multivitamin Tabs tablet Take 1 tablet by mouth daily at 12 noon.   sennosides-docusate sodium 8.6-50 MG tablet Commonly known as: SENOKOT-S Take 2 tablets by mouth 2 (two) times daily. What changed: when to take this   sertraline 50 MG tablet Commonly known as: ZOLOFT Take 50 mg by mouth daily.   simethicone 125 MG chewable tablet Commonly known as: MYLICON Chew 671 mg by mouth every 8 (eight) hours as needed for flatulence.   SM Tussin DM 10-100 MG/5ML liquid Generic drug: Dextromethorphan-guaiFENesin Take 15 mLs by mouth every 4 (four) hours as needed. What changed: reasons to take this   spironolactone 100 MG tablet Commonly known as: ALDACTONE Take 1 tablet (100 mg total) by mouth daily. For edema   sucralfate 1 GM/10ML suspension Commonly known as: CARAFATE Take 1 g by mouth every 6 (six) hours as needed (for [place]).   SYSTANE COMPLETE OP Place 2 drops into both eyes in the morning, at noon, in the evening, and at bedtime.   tiZANidine 4 MG tablet Commonly known as: ZANAFLEX Take 4 mg by mouth 3 (three) times daily.   traMADol 50 MG tablet Commonly known as: ULTRAM Take 1 tablet (50 mg total) by mouth every 6 (six) hours as needed. What changed: reasons to take this   Zinc Oxide 12.8 % ointment Commonly known as: TRIPLE PASTE Apply 1 application topically as needed for irritation.   zinc sulfate 220 (50 Zn) MG capsule Take 220 mg by mouth daily.               Discharge Care Instructions  (  From admission, onward)           Start     Ordered   07/03/21 0000  Discharge wound care:       Comments: No further dressing changed  needed, keep the puncture site clean and dry. No submerging x 7 days.   07/03/21 1407            Follow-up Information     Corrie Mckusick, DO Follow up.   Specialties: Interventional Radiology, Radiology Why: Televisit in 3-4 weeks with Dr. Earleen Newport. Our office will call you to set up the appointment. Contact information: [place] [address] [phone]                  Electronically Signed: Tera Mater, PA-C 07/03/2021, 2:09 PM   I have spent Less Than 30 Minutes discharging [name]

## 2021-07-03 NOTE — Consult Note (Addendum)
[facility] Nurse Consult Note: Reason for Consult: Consult requested for sacrum.  Performed remotely after review of the EMR. Pt has a Stage 2 pressure injury to the sacrum, according to the nursing wound care flow-sheet this was present on admission. This can be treated independently by the bedside nurses using the Skin care order set in Epic as follows to protect from further injury: Foam dressing to buttocks/sacrum, change Q 3 days or PRN soiling. Please re-consult if further assistance is needed.  Thank-you,  Julien Girt MSN, [place], [place], [place], [place]

## 2021-07-03 NOTE — TOC Initial Note (Signed)
Transition of Care [facility]) - Initial/Assessment Note    Patient Details  Name: [name] MRN: [id number] Date of Birth: [birth year]  [facility] CM/SW Contact:    Jacquese Cassarino, Marjie Skiff, RN Phone Number: 07/03/2021, 2:17 PM  Clinical Narrative:                  Pt is a resident of [facility] and she will dc back to there today. Pt sister brought her to the hospital yesterday for her procedure and she will come pick her up today. Pt was brought in her motorized wheelchair. DC summary sent to [facility] via the hub.      Expected Discharge Plan and Services           Expected Discharge Date: 07/03/21                         Activities of Daily Living Home Assistive Devices/Equipment: Other (Comment), Hoyer Lift, Dentures (specify type), Eyeglasses (electric chair, upper partial plate,) ADL Screening (condition at time of admission) Patient's cognitive ability adequate to safely complete daily activities?: Yes Is the patient deaf or have difficulty hearing?: No Does the patient have difficulty seeing, even when wearing glasses/contacts?: No Does the patient have difficulty concentrating, remembering, or making decisions?: No Patient able to express need for assistance with ADLs?: Yes Does the patient have difficulty dressing or bathing?: Yes Independently performs ADLs?: No Communication: Independent Dressing (OT): Independent Is this a change from baseline?: Pre-admission baseline Grooming: Independent Is this a change from baseline?: Pre-admission baseline Feeding: Independent Bathing: Needs assistance Is this a change from baseline?: Pre-admission baseline Toileting: Needs assistance Is this a change from baseline?: Pre-admission baseline In/Out Bed: Needs assistance (hoyer) Is this a change from baseline?: Pre-admission baseline Walks in Home: Dependent Is this a change from baseline?: Pre-admission baseline Does the patient have difficulty walking or  climbing stairs?: Yes Weakness of Legs: None Weakness of Arms/Hands: None  Admission diagnosis:  Hepatocellular carcinoma ([place]) [C22.0] Patient Active Problem List   Diagnosis Date Noted   Dry gangrene ([place]) 04/10/2021   Osteomyelitis ([place]) 04/09/2021   Hiatal hernia with GERD 02/07/2021   Hepatocellular carcinoma ([place]) 02/07/2021   Sepsis secondary to UTI ([place]) 12/08/2020   Spasticity 12/06/2020   Contracture of lower leg joint 12/06/2020   Autonomic dysfunction 08/30/2020   Neurogenic bowel 08/30/2020   Wheelchair dependence 08/30/2020   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   Medication management 06/07/2018   Cough 06/07/2018   Cirrhosis ([place]) 04/20/2018   Obstipation/fecal impaction 02/12/2018   Intractable vomiting 02/11/2018   Chronic hepatitis C without hepatic coma ([place]) 01/10/2018   NICM (nonischemic cardiomyopathy) ([place]) 08/25/2017   Normal coronary arteries 95/62/1308   Diastolic dysfunction 65/78/4696   S/P thoracentesis    Status post thoracentesis    S/p percutaneous right heart catheterization    Pleural effusion on right 06/29/2017   Respiratory failure with hypoxia ([place]) 06/29/2017   Pressure injury of skin 06/29/2017   Shortness of breath    Hypokalemia 09/21/2016   Fever in adult 07/28/2016   Decubitus ulcer of coccyx 02/04/2016   Hyperglycemia 12/03/2015   H/O bilateral mastectomy 07/25/2015   Benign hypertensive heart disease without heart failure 07/15/2015   Hiatal hernia 07/06/2015   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   History of breast cancer 08/21/2014   Adynamic ileus ([place]) 07/22/2014   Elevated liver enzymes 07/22/2014   CHF (congestive heart failure) ([place])  07/05/2014   Gastroparesis 07/05/2014   Microcytic anemia 01/05/2014   Insomnia 07/14/2013   Edema 05/01/2013   Rheumatoid arthritis ([place]) 02/23/2013   GERD (gastroesophageal reflux disease) 02/23/2013   Essential hypertension 02/23/2013    Neurogenic bladder 02/23/2013   Paraplegia ([place]) 02/23/2013   Depression 02/23/2013   Allergic rhinitis 02/23/2013   Constipation 02/23/2013   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

## 2021-07-07 ENCOUNTER — Inpatient Hospital Stay: Payer: Medicare Other | Admitting: Oncology

## 2021-07-07 ENCOUNTER — Inpatient Hospital Stay: Payer: Medicare Other | Attending: Oncology

## 2021-07-08 ENCOUNTER — Telehealth: Payer: Self-pay | Admitting: *Deleted

## 2021-07-08 NOTE — Telephone Encounter (Addendum)
Called patient to f/u on her missed appointment yesterday (was at [facility] seeing ENT). She reports doing well--had her microwave ablation on 12/7. Prefers appointments in afternoon if possible. Still resides at [facility] facility. Per Dr. Benay Spice: He sees patient 4-5 months after ablation, but IR should be seeing her soon and doing an MRI in about 3-4 months. Informed patient that d/c note said she will have telehealth visit w/Dr. Earleen Newport 3-4 weeks after procedure. This RN will monitor to ensure she does and that MRI gets ordered. Will send scheduling message to see Dr. Benay Spice in 4 months. She agrees to this plan.

## 2021-07-10 ENCOUNTER — Other Ambulatory Visit: Payer: Self-pay

## 2021-07-10 ENCOUNTER — Encounter (HOSPITAL_BASED_OUTPATIENT_CLINIC_OR_DEPARTMENT_OTHER): Payer: Medicare Other | Admitting: Internal Medicine

## 2021-07-10 DIAGNOSIS — L89152 Pressure ulcer of sacral region, stage 2: Secondary | ICD-10-CM

## 2021-07-10 DIAGNOSIS — S30811A Abrasion of abdominal wall, initial encounter: Secondary | ICD-10-CM

## 2021-07-10 DIAGNOSIS — L89313 Pressure ulcer of right buttock, stage 3: Secondary | ICD-10-CM | POA: Diagnosis not present
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
## 2021-07-14 NOTE — Progress Notes (Signed)
[name], [name] ([id number]) Visit Report for 07/10/2021 Arrival Information Details Patient Name: Date of Service: [place], [name] 07/10/2021 3:15 PM Medical Record Number: [id number] Patient Account Number: [id number] Date of Birth/Sex: Treating RN: 05-30-1951 (70 y.o. [name] Primary Care Vernida Mcnicholas: Caprice Renshaw Other Clinician: Referring Dorris Pierre: Treating Demiyah Fischbach/Extender: Heath Lark in Treatment: 8 Visit Information History Since Last Visit Added or deleted any medications: No Patient Arrived: Wheel Chair Any new allergies or adverse reactions: No Arrival Time: 15:26 Had a fall or experienced change in No Accompanied By: alone activities of daily living that may affect Transfer Assistance: Harrel Lemon Lift risk of falls: Patient Identification Verified: Yes Signs or symptoms of abuse/neglect since last visito No Secondary Verification Process Completed: Yes Hospitalized since last visit: No Patient Requires Transmission-Based Precautions: No Implantable device outside of the clinic excluding No Patient Has Alerts: Yes cellular tissue based products placed in the center Patient Alerts: Patient on Blood Thinner since last visit: Has Dressing in Place as Prescribed: Yes Has Compression in Place as Prescribed: Yes Pain Present Now: No Electronic Signature(s) Signed: 07/14/2021 5:28:24 PM By: Levan Hurst RN, BSN Entered By: Levan Hurst on 07/10/2021 17:22:04 -------------------------------------------------------------------------------- Clinic Level of Care Assessment Details Patient Name: Date of Service: [name], [name] 07/10/2021 3:15 PM Medical Record Number: [id number] Patient Account Number: [id number] Date of Birth/Sex: Treating RN: 1951/06/26 (70 y.o. [name] Primary Care Nanette Wirsing: Caprice Renshaw Other Clinician: Referring Bradd Merlos: Treating Yvette Roark/Extender: Heath Lark in Treatment:  8 Clinic Level of Care Assessment Items TOOL 4 Quantity Score X- 1 0 Use when only an EandM is performed on FOLLOW-UP visit ASSESSMENTS - Nursing Assessment / Reassessment X- 1 10 Reassessment of Co-morbidities (includes updates in patient status) X- 1 5 Reassessment of Adherence to Treatment Plan ASSESSMENTS - Wound and Skin A ssessment / Reassessment []  - 0 Simple Wound Assessment / Reassessment - one wound X- 2 5 Complex Wound Assessment / Reassessment - multiple wounds []  - 0 Dermatologic / Skin Assessment (not related to wound area) ASSESSMENTS - Focused Assessment []  - 0 Circumferential Edema Measurements - multi extremities []  - 0 Nutritional Assessment / Counseling / Intervention []  - 0 Lower Extremity Assessment (monofilament, tuning fork, pulses) []  - 0 Peripheral Arterial Disease Assessment (using hand held doppler) ASSESSMENTS - Ostomy and/or Continence Assessment and Care []  - 0 Incontinence Assessment and Management []  - 0 Ostomy Care Assessment and Management (repouching, etc.) PROCESS - Coordination of Care X - Simple Patient / Family Education for ongoing care 1 15 []  - 0 Complex (extensive) Patient / Family Education for ongoing care X- 1 10 Staff obtains Programmer, systems, Records, T Results / Process Orders est X- 1 10 Staff telephones HHA, Nursing Homes / Clarify orders / etc []  - 0 Routine Transfer to another Facility (non-emergent condition) []  - 0 Routine Hospital Admission (non-emergent condition) []  - 0 New Admissions / Biomedical engineer / Ordering NPWT Apligraf, etc. , []  - 0 Emergency Hospital Admission (emergent condition) X- 1 10 Simple Discharge Coordination []  - 0 Complex (extensive) Discharge Coordination PROCESS - Special Needs []  - 0 Pediatric / Minor Patient Management []  - 0 Isolation Patient Management []  - 0 Hearing / Language / Visual special needs []  - 0 Assessment of Community assistance (transportation, D/C  planning, etc.) []  - 0 Additional assistance / Altered mentation []  - 0 Support Surface(s) Assessment (bed, cushion, seat, etc.) INTERVENTIONS - Wound Cleansing / Measurement []  -  0 Simple Wound Cleansing - one wound X- 2 5 Complex Wound Cleansing - multiple wounds X- 1 5 Wound Imaging (photographs - any number of wounds) []  - 0 Wound Tracing (instead of photographs) []  - 0 Simple Wound Measurement - one wound X- 2 5 Complex Wound Measurement - multiple wounds INTERVENTIONS - Wound Dressings X - Small Wound Dressing one or multiple wounds 2 10 []  - 0 Medium Wound Dressing one or multiple wounds []  - 0 Large Wound Dressing one or multiple wounds []  - 0 Application of Medications - topical []  - 0 Application of Medications - injection INTERVENTIONS - Miscellaneous []  - 0 External ear exam []  - 0 Specimen Collection (cultures, biopsies, blood, body fluids, etc.) []  - 0 Specimen(s) / Culture(s) sent or taken to Lab for analysis X- 1 10 Patient Transfer (multiple staff / Civil Service fast streamer / Similar devices) []  - 0 Simple Staple / Suture removal (25 or less) []  - 0 Complex Staple / Suture removal (26 or more) []  - 0 Hypo / Hyperglycemic Management (close monitor of Blood Glucose) []  - 0 Ankle / Brachial Index (ABI) - do not check if billed separately X- 1 5 Vital Signs Has the patient been seen at the hospital within the last three years: Yes Total Score: 130 Level Of Care: New/Established - Level 4 Electronic Signature(s) Signed: 07/14/2021 5:28:24 PM By: Levan Hurst RN, BSN Entered By: Levan Hurst on 07/10/2021 17:20:59 -------------------------------------------------------------------------------- Encounter Discharge Information Details Patient Name: Date of Service: [name], [name] 07/10/2021 3:15 PM Medical Record Number: [id number] Patient Account Number: [id number] Date of Birth/Sex: Treating RN: [birth year] (70 y.o. [name] Primary Care  Jaxton Casale: Caprice Renshaw Other Clinician: Referring Dmya Long: Treating Alda Gaultney/Extender: Heath Lark in Treatment: 8 Encounter Discharge Information Items Discharge Condition: Stable Ambulatory Status: Wheelchair Discharge Destination: Home Transportation: Private Auto Accompanied By: alone Schedule Follow-up Appointment: Yes Clinical Summary of Care: Patient Declined Electronic Signature(s) Signed: 07/14/2021 5:28:24 PM By: Levan Hurst RN, BSN Entered By: Levan Hurst on 07/10/2021 17:21:56 -------------------------------------------------------------------------------- Multi Wound Chart Details Patient Name: Date of Service: [place], [name] 07/10/2021 3:15 PM Medical Record Number: [id number] Patient Account Number: [id number] Date of Birth/Sex: Treating RN: October 05, 1950 (70 y.o. F) Primary Care Edra Riccardi: Caprice Renshaw Other Clinician: Referring Taiana Temkin: Treating Trong [place]/Extender: Heath Lark in Treatment: 8 Vital Signs Height(in): Pulse(bpm): 48 Weight(lbs): Blood Pressure(mmHg): 114/76 Body Mass Index(BMI): Temperature(F): 98.9 Respiratory Rate(breaths/min): 18 Photos: [2:Sacrum] [3:Right Abdomen - Lower Quadrant] [4:Proximal Sacrum] Wound Location: [2:Pressure Injury] [3:Blister] [4:Pressure Injury] Wounding Event: [2:Pressure Ulcer] [3:Abrasion] [4:Pressure Ulcer] Primary Etiology: [2:Anemia, Chronic Obstructive] [3:Anemia, Chronic Obstructive] [4:Anemia, Chronic Obstructive] Comorbid History: [2:Pulmonary Disease (COPD), Congestive Heart Failure, Coronary Artery Disease, Hypertension, Rheumatoid Arthritis, Paraplegia, Received Chemotherapy 05/27/2021] [3:Pulmonary Disease (COPD), Congestive Heart Failure, Coronary Congestive  Heart Failure, Coronary Artery Disease, Hypertension, Rheumatoid Arthritis, Paraplegia, Received Chemotherapy 05/27/2021] [4:Pulmonary Disease (COPD), Artery Disease, Hypertension, Rheumatoid  Arthritis, Paraplegia, Received Chemotherapy 06/26/2021] Date Acquired: [2:6] [3:3] [4:2] Weeks of Treatment: [2:Open] [3:Open] [4:Open] Wound Status: [2:No] [3:No] [4:Yes] Clustered Wound: [2:N/A] [3:N/A] [4:2] Clustered Quantity: [2:0.4x0.4x0.3] [3:0.1x0.1x0.1] [4:0x0x0] Measurements L x W x D (cm) [2:0.126] [3:0.008] [4:0] A (cm) : rea [2:0.038] [3:0.001] [4:0] Volume (cm) : [2:0.00%] [3:99.80%] [4:100.00%] % Reduction in Area: [2:39.70%] [3:99.90%] [4:100.00%] % Reduction in Volume: [2:Category/Stage II] [3:Full Thickness Without Exposed] [4:Category/Stage II] Classification: [2:Medium] [3:Support Structures Small] [4:None Present] Exudate Amount: [2:Serosanguineous] [3:Serosanguineous] [4:N/A] Exudate Type: [2:red, brown] [3:red, brown] [4:N/A] Exudate Color: [2:Distinct, outline  attached] [3:Flat and Intact] [4:Flat and Intact] Wound Margin: [2:Large (67-100%)] [3:Large (67-100%)] [4:None Present (0%)] Granulation Amount: [2:Pink, Friable] [3:Pink] [4:N/A] Granulation Quality: [2:None Present (0%)] [3:None Present (0%)] [4:None Present (0%)] Necrotic Amount: [2:Fat Layer (Subcutaneous Tissue): Yes Fat Layer (Subcutaneous Tissue): Yes Fascia: No] Exposed Structures: [2:Fascia: No Tendon: No Muscle: No Joint: No Bone: No Medium (34-66%)] [3:Fascia: No Tendon: No Muscle: No Joint: No Bone: No Large (67-100%)] [4:Fat Layer (Subcutaneous Tissue): No Tendon: No Muscle: No Joint: No Bone: No Large (67-100%)] Treatment Notes Electronic Signature(s) Signed: 07/10/2021 4:14:13 PM By: Kalman Shan DO Entered By: Kalman Shan on 07/10/2021 16:10:06 -------------------------------------------------------------------------------- Multi-Disciplinary Care Plan Details Patient Name: Date of Service: [facility] RD, [name] 07/10/2021 3:15 PM Medical Record Number: [id number] Patient Account Number: [id number] Date of Birth/Sex: Treating RN: [birth year] (70 y.o. [name] Primary  Care Latasha Puskas: Caprice Renshaw Other Clinician: Referring Shyniece Scripter: Treating Devinn Hurwitz/Extender: Heath Lark in Treatment: 8 Active Inactive Pressure Nursing Diagnoses: Knowledge deficit related to management of pressures ulcers Goals: Patient/caregiver will verbalize understanding of pressure ulcer management Date Initiated: 05/15/2021 Target Resolution Date: 07/25/2021 Goal Status: Active Interventions: Assess: immobility, friction, shearing, incontinence upon admission and as needed Assess offloading mechanisms upon admission and as needed Assess potential for pressure ulcer upon admission and as needed Provide education on pressure ulcers Notes: Wound/Skin Impairment Nursing Diagnoses: Impaired tissue integrity Goals: Patient/caregiver will verbalize understanding of skin care regimen Date Initiated: 05/15/2021 Target Resolution Date: 07/25/2021 Goal Status: Active Ulcer/skin breakdown will have a volume reduction of 30% by week 4 Date Initiated: 05/15/2021 Date Inactivated: 06/26/2021 Target Resolution Date: 06/19/2021 Goal Status: Met Interventions: Assess patient/caregiver ability to obtain necessary supplies Assess patient/caregiver ability to perform ulcer/skin care regimen upon admission and as needed Assess ulceration(s) every visit Provide education on ulcer and skin care Treatment Activities: Skin care regimen initiated : 05/15/2021 Topical wound management initiated : 05/15/2021 Notes: Electronic Signature(s) Signed: 07/14/2021 5:28:24 PM By: Levan Hurst RN, BSN Entered By: Levan Hurst on 07/10/2021 17:20:09 -------------------------------------------------------------------------------- Pain Assessment Details Patient Name: Date of Service: [place], [name] 07/10/2021 3:15 PM Medical Record Number: [id number] Patient Account Number: [id number] Date of Birth/Sex: Treating RN: Nov 11, 1950 (70 y.o. [name] Primary  Care Jasmarie Coppock: Caprice Renshaw Other Clinician: Referring Karinda Cabriales: Treating Gracey Tolle/Extender: Heath Lark in Treatment: 8 Active Problems Location of Pain Severity and Description of Pain Patient Has Paino No Site Locations Pain Management and Medication Current Pain Management: Electronic Signature(s) Signed: 07/14/2021 5:28:24 PM By: Levan Hurst RN, BSN Entered By: Levan Hurst on 07/10/2021 15:32:26 -------------------------------------------------------------------------------- Patient/Caregiver Education Details Patient Name: Date of Service: [place], [place] 12/15/2022andnbsp3:15 PM Medical Record Number: [id number] Patient Account Number: [id number] Date of Birth/Gender: Treating RN: [birth year] (70 y.o. [name] Primary Care Physician: Caprice Renshaw Other Clinician: Referring Physician: Treating Physician/Extender: Heath Lark in Treatment: 8 Education Assessment Education Provided To: Patient Education Topics Provided Wound/Skin Impairment: Methods: Explain/Verbal Responses: State content correctly Electronic Signature(s) Signed: 07/14/2021 5:28:24 PM By: Levan Hurst RN, BSN Entered By: Levan Hurst on 07/10/2021 17:20:24 -------------------------------------------------------------------------------- Wound Assessment Details Patient Name: Date of Service: [place], [name] 07/10/2021 3:15 PM Medical Record Number: [id number] Patient Account Number: [id number] Date of Birth/Sex: Treating RN: 1950-10-10 (70 y.o. [name] Primary Care Leno Mathes: Caprice Renshaw Other Clinician: Referring Kendy Haston: Treating Shavontae Gibeault/Extender: Heath Lark in Treatment: 8 Wound Status Wound Number: 2 Primary Pressure Ulcer  Etiology: Wound Location: Sacrum Wound Open Wounding Event: Pressure Injury Status: Date Acquired: 05/27/2021 Comorbid Anemia, Chronic Obstructive Pulmonary  Disease (COPD), Weeks Of Treatment: 6 History: Congestive Heart Failure, Coronary Artery Disease, Hypertension, Clustered Wound: No Rheumatoid Arthritis, Paraplegia, Received Chemotherapy Photos Wound Measurements Length: (cm) 0.4 Width: (cm) 0.4 Depth: (cm) 0.3 Area: (cm) 0.126 Volume: (cm) 0.038 % Reduction in Area: 0% % Reduction in Volume: 39.7% Epithelialization: Medium (34-66%) Tunneling: No Undermining: No Wound Description Classification: Category/Stage II Wound Margin: Distinct, outline attached Exudate Amount: Medium Exudate Type: Serosanguineous Exudate Color: red, brown Foul Odor After Cleansing: No Slough/Fibrino No Wound Bed Granulation Amount: Large (67-100%) Exposed Structure Granulation Quality: Pink, Friable Fascia Exposed: No Necrotic Amount: None Present (0%) Fat Layer (Subcutaneous Tissue) Exposed: Yes Tendon Exposed: No Muscle Exposed: No Joint Exposed: No Bone Exposed: No Treatment Notes Wound #2 (Sacrum) Cleanser Wound Cleanser Discharge Instruction: Cleanse the wound with wound cleanser prior to applying a clean dressing using gauze sponges, not tissue or cotton balls. Peri-Wound Care Topical Primary Dressing Hydrofera Blue Classic Foam, 2x2 in Discharge Instruction: Moisten with saline prior to applying to wound bed Secondary Dressing Woven Gauze Sponge, Non-Sterile 4x4 in Discharge Instruction: Apply over primary dressing as directed. Zetuvit Plus Silicone Border Dressing 4x4 (in/in) Discharge Instruction: Apply silicone border over primary dressing as directed. Secured With Compression Wrap Compression Stockings Environmental education officer) Signed: 07/10/2021 3:54:11 PM By: Valeria Batman EMT Signed: 07/14/2021 5:28:24 PM By: Levan Hurst RN, BSN Entered By: Valeria Batman on 07/10/2021 15:54:10 -------------------------------------------------------------------------------- Wound Assessment Details Patient Name: Date of  Service: [place], [name] 07/10/2021 3:15 PM Medical Record Number: [id number] Patient Account Number: [id number] Date of Birth/Sex: Treating RN: November 19, 1950 (70 y.o. [name] Primary Care Jakira Mcfadden: Caprice Renshaw Other Clinician: Referring Dniyah Grant: Treating Harini Dearmond/Extender: Heath Lark in Treatment: 8 Wound Status Wound Number: 3 Primary Abrasion Etiology: Wound Location: Right Abdomen - Lower Quadrant Wound Open Wounding Event: Blister Status: Date Acquired: 05/27/2021 Comorbid Anemia, Chronic Obstructive Pulmonary Disease (COPD), Weeks Of Treatment: 3 History: Congestive Heart Failure, Coronary Artery Disease, Hypertension, Clustered Wound: No Rheumatoid Arthritis, Paraplegia, Received Chemotherapy Photos Wound Measurements Length: (cm) 0.1 Width: (cm) 0.1 Depth: (cm) 0.1 Area: (cm) 0.008 Volume: (cm) 0.001 % Reduction in Area: 99.8% % Reduction in Volume: 99.9% Epithelialization: Large (67-100%) Tunneling: No Undermining: No Wound Description Classification: Full Thickness Without Exposed Support Structures Wound Margin: Flat and Intact Exudate Amount: Small Exudate Type: Serosanguineous Exudate Color: red, brown Foul Odor After Cleansing: No Slough/Fibrino No Wound Bed Granulation Amount: Large (67-100%) Exposed Structure Granulation Quality: Pink Fascia Exposed: No Necrotic Amount: None Present (0%) Fat Layer (Subcutaneous Tissue) Exposed: Yes Tendon Exposed: No Muscle Exposed: No Joint Exposed: No Bone Exposed: No Treatment Notes Wound #3 (Abdomen - Lower Quadrant) Wound Laterality: Right Cleanser Normal Saline Discharge Instruction: Cleanse the wound with Normal Saline prior to applying a clean dressing using gauze sponges, not tissue or cotton balls. Soap and Water Discharge Instruction: May shower and wash wound with dial antibacterial soap and water prior to dressing change. Peri-Wound Care Topical Primary  Dressing Secondary Dressing Zetuvit Plus Silicone Border Dressing 4x4 (in/in) Discharge Instruction: Apply silicone border for protection Secured With Compression Wrap Compression Stockings Add-Ons Electronic Signature(s) Signed: 07/14/2021 5:28:24 PM By: Levan Hurst RN, BSN Previous Signature: 07/10/2021 3:51:26 PM Version By: Valeria Batman EMT Entered By: Levan Hurst on 07/10/2021 15:53:27 -------------------------------------------------------------------------------- Wound Assessment Details Patient Name: Date of Service: [place], [name] 07/10/2021  3:15 PM Medical Record Number: [id number] Patient Account Number: [id number] Date of Birth/Sex: Treating RN: 1951/03/16 (70 y.o. [name] Primary Care Lylliana Kitamura: Caprice Renshaw Other Clinician: Referring Lavance Beazer: Treating Mellina Benison/Extender: Heath Lark in Treatment: 8 Wound Status Wound Number: 4 Primary Pressure Ulcer Etiology: Wound Location: Proximal Sacrum Wound Open Wounding Event: Pressure Injury Status: Date Acquired: 06/26/2021 Comorbid Anemia, Chronic Obstructive Pulmonary Disease (COPD), Weeks Of Treatment: 2 History: Congestive Heart Failure, Coronary Artery Disease, Hypertension, Clustered Wound: Yes Rheumatoid Arthritis, Paraplegia, Received Chemotherapy Photos Wound Measurements Length: (cm) Width: (cm) Depth: (cm) Clustered Quantity: Area: (cm) Volume: (cm) 0 % Reduction in Area: 100% 0 % Reduction in Volume: 100% 0 Epithelialization: Large (67-100%) 2 Tunneling: No 0 Undermining: No 0 Wound Description Classification: Category/Stage II Wound Margin: Flat and Intact Exudate Amount: None Present Foul Odor After Cleansing: No Slough/Fibrino No Wound Bed Granulation Amount: None Present (0%) Exposed Structure Necrotic Amount: None Present (0%) Fascia Exposed: No Fat Layer (Subcutaneous Tissue) Exposed: No Tendon Exposed: No Muscle Exposed: No Joint  Exposed: No Bone Exposed: No Electronic Signature(s) Signed: 07/10/2021 3:52:49 PM By: Valeria Batman EMT Signed: 07/14/2021 5:28:24 PM By: Levan Hurst RN, BSN Entered By: Valeria Batman on 07/10/2021 15:52:48 -------------------------------------------------------------------------------- [place] Details Patient Name: Date of Service: [place], [name] 07/10/2021 3:15 PM Medical Record Number: [id number] Patient Account Number: [id number] Date of Birth/Sex: Treating RN: [birth year] (70 y.o. [name] Primary Care Onyx Edgley: Caprice Renshaw Other Clinician: Referring Kalasia Crafton: Treating Prosper Paff/Extender: Heath Lark in Treatment: 8 Vital Signs Time Taken: 15:26 Temperature (F): 98.9 Pulse (bpm): 89 Respiratory Rate (breaths/min): 18 Blood Pressure (mmHg): 114/76 Reference Range: 80 - 120 mg / dl Electronic Signature(s) Signed: 07/14/2021 5:28:24 PM By: Levan Hurst RN, BSN Entered By: Levan Hurst on 07/10/2021 15:32:21

## 2021-07-15 ENCOUNTER — Ambulatory Visit: Payer: Medicare Other | Admitting: Oncology

## 2021-07-15 ENCOUNTER — Inpatient Hospital Stay: Payer: Medicare Other

## 2021-07-16 ENCOUNTER — Encounter: Payer: Self-pay | Admitting: Orthopedic Surgery
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
## 2021-07-23 ENCOUNTER — Inpatient Hospital Stay (HOSPITAL_COMMUNITY)
Admission: EM | Admit: 2021-07-23 | Discharge: 2021-07-28 | DRG: 698 | Disposition: A | Discharge status: Skilled Nursing Facility | Payer: Medicare Other | Attending: Internal Medicine | Admitting: Internal Medicine

## 2021-07-23 ENCOUNTER — Emergency Department (HOSPITAL_COMMUNITY): Payer: Medicare Other

## 2021-07-23 ENCOUNTER — Inpatient Hospital Stay (HOSPITAL_COMMUNITY): Payer: Medicare Other

## 2021-07-23 DIAGNOSIS — F32A Depression, unspecified: Secondary | ICD-10-CM | POA: Diagnosis present

## 2021-07-23 DIAGNOSIS — R319 Hematuria, unspecified: Secondary | ICD-10-CM | POA: Diagnosis present

## 2021-07-23 DIAGNOSIS — Z8249 Family history of ischemic heart disease and other diseases of the circulatory system: Secondary | ICD-10-CM | POA: Diagnosis not present

## 2021-07-23 DIAGNOSIS — Z86010 Personal history of colonic polyps: Secondary | ICD-10-CM

## 2021-07-23 DIAGNOSIS — A4159 Other Gram-negative sepsis: Secondary | ICD-10-CM | POA: Diagnosis present

## 2021-07-23 DIAGNOSIS — Z8505 Personal history of malignant neoplasm of liver: Secondary | ICD-10-CM

## 2021-07-23 DIAGNOSIS — N136 Pyonephrosis: Secondary | ICD-10-CM | POA: Diagnosis present

## 2021-07-23 DIAGNOSIS — Z800 Family history of malignant neoplasm of digestive organs: Secondary | ICD-10-CM

## 2021-07-23 DIAGNOSIS — R6521 Severe sepsis with septic shock: Secondary | ICD-10-CM | POA: Diagnosis present

## 2021-07-23 DIAGNOSIS — N319 Neuromuscular dysfunction of bladder, unspecified: Secondary | ICD-10-CM | POA: Diagnosis present

## 2021-07-23 DIAGNOSIS — Z853 Personal history of malignant neoplasm of breast: Secondary | ICD-10-CM | POA: Diagnosis not present

## 2021-07-23 DIAGNOSIS — Z20822 Contact with and (suspected) exposure to covid-19: Secondary | ICD-10-CM | POA: Diagnosis present

## 2021-07-23 DIAGNOSIS — E872 Acidosis, unspecified: Secondary | ICD-10-CM | POA: Diagnosis present

## 2021-07-23 DIAGNOSIS — K5900 Constipation, unspecified: Secondary | ICD-10-CM | POA: Diagnosis present

## 2021-07-23 DIAGNOSIS — K7460 Unspecified cirrhosis of liver: Secondary | ICD-10-CM | POA: Diagnosis present

## 2021-07-23 DIAGNOSIS — I2510 Atherosclerotic heart disease of native coronary artery without angina pectoris: Secondary | ICD-10-CM | POA: Diagnosis present

## 2021-07-23 DIAGNOSIS — I5022 Chronic systolic (congestive) heart failure: Secondary | ICD-10-CM | POA: Diagnosis present

## 2021-07-23 DIAGNOSIS — G8220 Paraplegia, unspecified: Secondary | ICD-10-CM | POA: Diagnosis present

## 2021-07-23 DIAGNOSIS — N390 Urinary tract infection, site not specified: Secondary | ICD-10-CM | POA: Diagnosis present

## 2021-07-23 DIAGNOSIS — D509 Iron deficiency anemia, unspecified: Secondary | ICD-10-CM | POA: Diagnosis present

## 2021-07-23 DIAGNOSIS — R7881 Bacteremia: Secondary | ICD-10-CM | POA: Diagnosis not present

## 2021-07-23 DIAGNOSIS — L89152 Pressure ulcer of sacral region, stage 2: Secondary | ICD-10-CM | POA: Diagnosis present

## 2021-07-23 DIAGNOSIS — T83511A Infection and inflammatory reaction due to indwelling urethral catheter, initial encounter: Secondary | ICD-10-CM | POA: Diagnosis not present

## 2021-07-23 DIAGNOSIS — Z8616 Personal history of COVID-19: Secondary | ICD-10-CM | POA: Diagnosis not present

## 2021-07-23 DIAGNOSIS — Z823 Family history of stroke: Secondary | ICD-10-CM

## 2021-07-23 DIAGNOSIS — Z7982 Long term (current) use of aspirin: Secondary | ICD-10-CM

## 2021-07-23 DIAGNOSIS — T148XXS Other injury of unspecified body region, sequela: Secondary | ICD-10-CM

## 2021-07-23 DIAGNOSIS — I110 Hypertensive heart disease with heart failure: Secondary | ICD-10-CM | POA: Diagnosis present

## 2021-07-23 DIAGNOSIS — R7401 Elevation of levels of liver transaminase levels: Secondary | ICD-10-CM | POA: Diagnosis present

## 2021-07-23 DIAGNOSIS — E871 Hypo-osmolality and hyponatremia: Secondary | ICD-10-CM

## 2021-07-23 DIAGNOSIS — Z79899 Other long term (current) drug therapy: Secondary | ICD-10-CM

## 2021-07-23 DIAGNOSIS — A419 Sepsis, unspecified organism: Secondary | ICD-10-CM

## 2021-07-23 DIAGNOSIS — N179 Acute kidney failure, unspecified: Secondary | ICD-10-CM | POA: Diagnosis present

## 2021-07-23 DIAGNOSIS — B961 Klebsiella pneumoniae [K. pneumoniae] as the cause of diseases classified elsewhere: Secondary | ICD-10-CM | POA: Diagnosis not present

## 2021-07-23 DIAGNOSIS — Z1619 Resistance to other specified beta lactam antibiotics: Secondary | ICD-10-CM | POA: Diagnosis present

## 2021-07-23 DIAGNOSIS — Z87891 Personal history of nicotine dependence: Secondary | ICD-10-CM

## 2021-07-23 LAB — CBC WITH DIFFERENTIAL/PLATELET
Abs Immature Granulocytes: 0.05 10*3/uL (ref 0.00–0.07)
Basophils Absolute: 0 10*3/uL (ref 0.0–0.1)
Basophils Relative: 0 %
Eosinophils Absolute: 0 10*3/uL (ref 0.0–0.5)
Eosinophils Relative: 0 %
HCT: 36.7 % (ref 36.0–46.0)
Hemoglobin: 11.7 g/dL — ABNORMAL LOW (ref 12.0–15.0)
Immature Granulocytes: 0 %
Lymphocytes Relative: 6 %
Lymphs Abs: 0.8 10*3/uL (ref 0.7–4.0)
MCH: 24.5 pg — ABNORMAL LOW (ref 26.0–34.0)
MCHC: 31.9 g/dL (ref 30.0–36.0)
MCV: 76.9 fL — ABNORMAL LOW (ref 80.0–100.0)
Monocytes Absolute: 1.5 10*3/uL — ABNORMAL HIGH (ref 0.1–1.0)
Monocytes Relative: 11 %
Neutro Abs: 11.4 10*3/uL — ABNORMAL HIGH (ref 1.7–7.7)
Neutrophils Relative %: 83 %
Platelets: 252 10*3/uL (ref 150–400)
RBC: 4.77 MIL/uL (ref 3.87–5.11)
RDW: 17.2 % — ABNORMAL HIGH (ref 11.5–15.5)
WBC: 13.7 10*3/uL — ABNORMAL HIGH (ref 4.0–10.5)
nRBC: 0 % (ref 0.0–0.2)

## 2021-07-23 LAB — LACTIC ACID, PLASMA: Lactic Acid, Venous: 1.5 mmol/L (ref 0.5–1.9)

## 2021-07-23 LAB — COMPREHENSIVE METABOLIC PANEL
ALT: 74 U/L — ABNORMAL HIGH (ref 0–44)
AST: 52 U/L — ABNORMAL HIGH (ref 15–41)
Albumin: 3.1 g/dL — ABNORMAL LOW (ref 3.5–5.0)
Alkaline Phosphatase: 116 U/L (ref 38–126)
Anion gap: 12 (ref 5–15)
BUN: 46 mg/dL — ABNORMAL HIGH (ref 8–23)
CO2: 18 mmol/L — ABNORMAL LOW (ref 22–32)
Calcium: 9.2 mg/dL (ref 8.9–10.3)
Chloride: 95 mmol/L — ABNORMAL LOW (ref 98–111)
Creatinine, Ser: 1.68 mg/dL — ABNORMAL HIGH (ref 0.44–1.00)
GFR, Estimated: 33 mL/min — ABNORMAL LOW (ref >60)
Glucose, Bld: 142 mg/dL — ABNORMAL HIGH (ref 70–99)
Potassium: 4.1 mmol/L (ref 3.5–5.1)
Sodium: 125 mmol/L — ABNORMAL LOW (ref 135–145)
Total Bilirubin: 0.7 mg/dL (ref 0.3–1.2)
Total Protein: 8.1 g/dL (ref 6.5–8.1)

## 2021-07-23 LAB — RESP PANEL BY RT-PCR (FLU A&B, COVID) ARPGX2
Influenza A by PCR: NEGATIVE
Influenza B by PCR: NEGATIVE
SARS Coronavirus 2 by RT PCR: NEGATIVE

## 2021-07-23 LAB — URINALYSIS, ROUTINE W REFLEX MICROSCOPIC
Bilirubin Urine: NEGATIVE
Glucose, UA: NEGATIVE mg/dL
Ketones, ur: NEGATIVE mg/dL
Nitrite: NEGATIVE
Protein, ur: 100 mg/dL — AB
RBC / HPF: >50 RBC/hpf — ABNORMAL HIGH (ref 0–5)
Specific Gravity, Urine: 1.016 (ref 1.005–1.030)
WBC, UA: >50 WBC/hpf — ABNORMAL HIGH (ref 0–5)
pH: 5 (ref 5.0–8.0)

## 2021-07-23 LAB — PROTIME-INR
INR: 1.2 (ref 0.8–1.2)
Prothrombin Time: 15.7 seconds — ABNORMAL HIGH (ref 11.4–15.2)

## 2021-07-23 LAB — GLUCOSE, CAPILLARY: Glucose-Capillary: 157 mg/dL — ABNORMAL HIGH (ref 70–99)

## 2021-07-23 LAB — NA AND K (SODIUM & POTASSIUM), RAND UR
Potassium Urine: 42 mmol/L
Sodium, Ur: 35 mmol/L

## 2021-07-23 LAB — APTT: aPTT: 30 seconds (ref 24–36)

## 2021-07-23 LAB — CREATININE, URINE, RANDOM: Creatinine, Urine: 119.46 mg/dL

## 2021-07-23 LAB — MRSA NEXT GEN BY PCR, NASAL: MRSA by PCR Next Gen: DETECTED — AB

## 2021-07-23 MED ORDER — METRONIDAZOLE 500 MG/100ML IV SOLN
500.0000 mg | Freq: Once | INTRAVENOUS | Status: AC
  Administered 2021-07-23: 08:00:00 500 mg via INTRAVENOUS
  Filled 2021-07-23: qty 100

## 2021-07-23 MED ORDER — SODIUM CHLORIDE 0.9% FLUSH
3.0000 mL | Freq: Two times a day (BID) | INTRAVENOUS | Status: DC
  Administered 2021-07-23 – 2021-07-27 (×8): 3 mL via INTRAVENOUS

## 2021-07-23 MED ORDER — ACETAMINOPHEN 325 MG PO TABS
650.0000 mg | ORAL_TABLET | Freq: Once | ORAL | Status: AC
  Administered 2021-07-23: 08:00:00 650 mg via ORAL
  Filled 2021-07-23: qty 2

## 2021-07-23 MED ORDER — SODIUM CHLORIDE 0.9 % IV SOLN
50.0000 ug/h | INTRAVENOUS | Status: DC
  Administered 2021-07-24: 50 ug/h via INTRAVENOUS
  Filled 2021-07-23: qty 1

## 2021-07-23 MED ORDER — SODIUM CHLORIDE 0.9 % IV BOLUS
1700.0000 mL | Freq: Once | INTRAVENOUS | Status: AC
  Administered 2021-07-23: 10:00:00 1700 mL via INTRAVENOUS

## 2021-07-23 MED ORDER — CARBOXYMETHYLCELLULOSE SOD PF 0.5 % OP SOLN: 1.0000 [drp] | Freq: Four times a day (QID) | OPHTHALMIC | Status: DC

## 2021-07-23 MED ORDER — POLYVINYL ALCOHOL 1.4 % OP SOLN
1.0000 [drp] | OPHTHALMIC | Status: DC | PRN
  Filled 2021-07-23: qty 15

## 2021-07-23 MED ORDER — HYDROXYCHLOROQUINE SULFATE 200 MG PO TABS
200.0000 mg | ORAL_TABLET | Freq: Every day | ORAL | Status: DC
  Administered 2021-07-23 – 2021-07-28 (×6): 200 mg via ORAL
  Filled 2021-07-23 (×6): qty 1

## 2021-07-23 MED ORDER — SENNOSIDES-DOCUSATE SODIUM 8.6-50 MG PO TABS: 1.0000 | ORAL_TABLET | Freq: Every evening | ORAL | Status: DC | PRN

## 2021-07-23 MED ORDER — ENOXAPARIN SODIUM 30 MG/0.3ML IJ SOSY
30.0000 mg | PREFILLED_SYRINGE | INTRAMUSCULAR | Status: DC
  Administered 2021-07-23: 12:00:00 30 mg via SUBCUTANEOUS
  Filled 2021-07-23: qty 0.3

## 2021-07-23 MED ORDER — HYDROCORTISONE 1 % EX OINT: 1.0000 "application " | TOPICAL_OINTMENT | Freq: Two times a day (BID) | CUTANEOUS | Status: DC | PRN

## 2021-07-23 MED ORDER — PANTOPRAZOLE SODIUM 40 MG PO TBEC
40.0000 mg | DELAYED_RELEASE_TABLET | Freq: Every day | ORAL | Status: DC
  Administered 2021-07-23: 20:00:00 40 mg via ORAL
  Filled 2021-07-23: qty 1

## 2021-07-23 MED ORDER — ASPIRIN EC 81 MG PO TBEC
81.0000 mg | DELAYED_RELEASE_TABLET | Freq: Every day | ORAL | Status: DC
  Administered 2021-07-23 – 2021-07-28 (×6): 81 mg via ORAL
  Filled 2021-07-23 (×6): qty 1

## 2021-07-23 MED ORDER — ACETAMINOPHEN 650 MG RE SUPP: 650.0000 mg | Freq: Four times a day (QID) | RECTAL | Status: DC | PRN

## 2021-07-23 MED ORDER — ATORVASTATIN CALCIUM 40 MG PO TABS
40.0000 mg | ORAL_TABLET | Freq: Every day | ORAL | Status: DC
  Administered 2021-07-23 – 2021-07-28 (×6): 40 mg via ORAL
  Filled 2021-07-23 (×6): qty 1

## 2021-07-23 MED ORDER — ACETAMINOPHEN 325 MG PO TABS
650.0000 mg | ORAL_TABLET | Freq: Four times a day (QID) | ORAL | Status: DC | PRN
  Administered 2021-07-23 – 2021-07-24 (×2): 650 mg via ORAL
  Filled 2021-07-23 (×2): qty 2

## 2021-07-23 MED ORDER — LINACLOTIDE 145 MCG PO CAPS
290.0000 ug | ORAL_CAPSULE | Freq: Every day | ORAL | Status: DC
  Administered 2021-07-24 – 2021-07-28 (×5): 290 ug via ORAL
  Filled 2021-07-23 (×5): qty 2

## 2021-07-23 MED ORDER — CHLORHEXIDINE GLUCONATE CLOTH 2 % EX PADS
6.0000 | MEDICATED_PAD | Freq: Every day | CUTANEOUS | Status: DC
  Administered 2021-07-24 – 2021-07-28 (×5): 6 via TOPICAL

## 2021-07-23 MED ORDER — PANTOPRAZOLE SODIUM 40 MG IV SOLR
40.0000 mg | Freq: Two times a day (BID) | INTRAVENOUS | Status: DC
  Administered 2021-07-23: 40 mg via INTRAVENOUS
  Filled 2021-07-23 (×2): qty 40

## 2021-07-23 MED ORDER — SENNOSIDES-DOCUSATE SODIUM 8.6-50 MG PO TABS
2.0000 | ORAL_TABLET | Freq: Two times a day (BID) | ORAL | Status: DC
  Administered 2021-07-24 – 2021-07-28 (×9): 2 via ORAL
  Filled 2021-07-23 (×10): qty 2

## 2021-07-23 MED ORDER — SODIUM CHLORIDE 0.9 % IV SOLN
2.0000 g | Freq: Once | INTRAVENOUS | Status: AC
  Administered 2021-07-23: 08:00:00 2 g via INTRAVENOUS
  Filled 2021-07-23: qty 2

## 2021-07-23 MED ORDER — PROPYLENE GLYCOL 0.6 % OP SOLN: 2.0000 [drp] | Freq: Four times a day (QID) | OPHTHALMIC | Status: DC

## 2021-07-23 MED ORDER — POLYVINYL ALCOHOL 1.4 % OP SOLN
1.0000 [drp] | Freq: Four times a day (QID) | OPHTHALMIC | Status: DC
  Administered 2021-07-23 – 2021-07-27 (×4): 1 [drp] via OPHTHALMIC
  Filled 2021-07-23: qty 15

## 2021-07-23 MED ORDER — BISACODYL 10 MG RE SUPP
10.0000 mg | RECTAL | Status: DC
  Administered 2021-07-24 – 2021-07-27 (×4): 10 mg via RECTAL
  Filled 2021-07-23 (×4): qty 1

## 2021-07-23 MED ORDER — SERTRALINE HCL 50 MG PO TABS
50.0000 mg | ORAL_TABLET | Freq: Every day | ORAL | Status: DC
  Administered 2021-07-24 – 2021-07-28 (×5): 50 mg via ORAL
  Filled 2021-07-23 (×5): qty 1

## 2021-07-23 MED ORDER — VANCOMYCIN HCL 1500 MG/300ML IV SOLN
1500.0000 mg | Freq: Once | INTRAVENOUS | Status: AC
  Administered 2021-07-23: 09:00:00 1500 mg via INTRAVENOUS
  Filled 2021-07-23: qty 300

## 2021-07-23 MED ORDER — SODIUM CHLORIDE 0.9 % IV SOLN
2.0000 g | Freq: Two times a day (BID) | INTRAVENOUS | Status: DC
  Administered 2021-07-23 – 2021-07-24 (×2): 2 g via INTRAVENOUS
  Filled 2021-07-23 (×2): qty 2

## 2021-07-23 MED ORDER — TIZANIDINE HCL 2 MG PO TABS
4.0000 mg | ORAL_TABLET | Freq: Three times a day (TID) | ORAL | Status: DC
  Administered 2021-07-23 – 2021-07-28 (×15): 4 mg via ORAL
  Filled 2021-07-23 (×12): qty 2
  Filled 2021-07-23: qty 1
  Filled 2021-07-23 (×2): qty 2

## 2021-07-23 MED ORDER — ONDANSETRON HCL 4 MG/2ML IJ SOLN
4.0000 mg | Freq: Once | INTRAMUSCULAR | Status: AC
  Administered 2021-07-23: 4 mg via INTRAVENOUS
  Filled 2021-07-23: qty 2

## 2021-07-23 MED ORDER — BRIMONIDINE TARTRATE 0.025 % OP SOLN: 1.0000 [drp] | Freq: Every day | OPHTHALMIC | Status: DC | PRN

## 2021-07-23 MED ORDER — OCTREOTIDE LOAD VIA INFUSION
50.0000 ug | Freq: Once | INTRAVENOUS | Status: AC
  Administered 2021-07-24: 50 ug via INTRAVENOUS
  Filled 2021-07-23: qty 25

## 2021-07-23 MED ORDER — LORAZEPAM 0.5 MG PO TABS
0.5000 mg | ORAL_TABLET | Freq: Every day | ORAL | Status: DC
  Administered 2021-07-23 – 2021-07-28 (×6): 0.5 mg via ORAL
  Filled 2021-07-23 (×6): qty 1
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
## 2021-07-23 NOTE — ED Notes (Signed)
Lunch ordered 

## 2021-07-23 NOTE — Progress Notes (Signed)
Foley cath Fr 22 replaced aseptically,assisted by [facility]. Noted yellow urine return.Patient tolerated procedure well.

## 2021-07-23 NOTE — Progress Notes (Signed)
New Admission Note:   Arrival Method: Arrived from [facility] ED via stretcher Mental Orientation: Alert and oriented to person, place and time Telemetry: Box #3 Assessment: Completed Skin: See doc flowsheet IV: Rt PFA, Rt AFA Pain: 0/10 Tubes: N/A Safety Measures: Safety Fall Prevention Plan has been discussed.  Admission: Completed 5MW Orientation: Patient has been oriented to the room, unit and staff. Call bell within reach. Family: None at bedside  Orders have been reviewed and implemented. Will continue to monitor the patient. Call light has been placed within reach and bed alarm has been activated.   Demiyah Fischbach [employer], RN-BC Phone number: [phone]
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
## 2021-07-24 ENCOUNTER — Encounter (HOSPITAL_BASED_OUTPATIENT_CLINIC_OR_DEPARTMENT_OTHER): Payer: Medicare Other | Admitting: Internal Medicine

## 2021-07-24 DIAGNOSIS — R7881 Bacteremia: Secondary | ICD-10-CM | POA: Diagnosis not present

## 2021-07-24 DIAGNOSIS — T83511A Infection and inflammatory reaction due to indwelling urethral catheter, initial encounter: Principal | ICD-10-CM

## 2021-07-24 DIAGNOSIS — B961 Klebsiella pneumoniae [K. pneumoniae] as the cause of diseases classified elsewhere: Secondary | ICD-10-CM

## 2021-07-24 DIAGNOSIS — N179 Acute kidney failure, unspecified: Secondary | ICD-10-CM

## 2021-07-24 DIAGNOSIS — N390 Urinary tract infection, site not specified: Secondary | ICD-10-CM

## 2021-07-24 LAB — COMPREHENSIVE METABOLIC PANEL
ALT: 76 U/L — ABNORMAL HIGH (ref 0–44)
AST: 62 U/L — ABNORMAL HIGH (ref 15–41)
Albumin: 2.7 g/dL — ABNORMAL LOW (ref 3.5–5.0)
Alkaline Phosphatase: 121 U/L (ref 38–126)
Anion gap: 12 (ref 5–15)
BUN: 47 mg/dL — ABNORMAL HIGH (ref 8–23)
CO2: 17 mmol/L — ABNORMAL LOW (ref 22–32)
Calcium: 9.4 mg/dL (ref 8.9–10.3)
Chloride: 101 mmol/L (ref 98–111)
Creatinine, Ser: 1.57 mg/dL — ABNORMAL HIGH (ref 0.44–1.00)
GFR, Estimated: 35 mL/min — ABNORMAL LOW (ref >60)
Glucose, Bld: 140 mg/dL — ABNORMAL HIGH (ref 70–99)
Potassium: 4 mmol/L (ref 3.5–5.1)
Sodium: 130 mmol/L — ABNORMAL LOW (ref 135–145)
Total Bilirubin: 0.8 mg/dL (ref 0.3–1.2)
Total Protein: 7.7 g/dL (ref 6.5–8.1)

## 2021-07-24 LAB — BASIC METABOLIC PANEL
Anion gap: 10 (ref 5–15)
BUN: 43 mg/dL — ABNORMAL HIGH (ref 8–23)
CO2: 19 mmol/L — ABNORMAL LOW (ref 22–32)
Calcium: 9.4 mg/dL (ref 8.9–10.3)
Chloride: 103 mmol/L (ref 98–111)
Creatinine, Ser: 1.35 mg/dL — ABNORMAL HIGH (ref 0.44–1.00)
GFR, Estimated: 42 mL/min — ABNORMAL LOW (ref >60)
Glucose, Bld: 115 mg/dL — ABNORMAL HIGH (ref 70–99)
Potassium: 3.8 mmol/L (ref 3.5–5.1)
Sodium: 132 mmol/L — ABNORMAL LOW (ref 135–145)

## 2021-07-24 LAB — BLOOD CULTURE ID PANEL (REFLEXED) - BCID2

## 2021-07-24 LAB — GLUCOSE, CAPILLARY
Glucose-Capillary: 106 mg/dL — ABNORMAL HIGH (ref 70–99)
Glucose-Capillary: 121 mg/dL — ABNORMAL HIGH (ref 70–99)
Glucose-Capillary: 141 mg/dL — ABNORMAL HIGH (ref 70–99)

## 2021-07-24 LAB — CBC
HCT: 34.4 % — ABNORMAL LOW (ref 36.0–46.0)
Hemoglobin: 10.6 g/dL — ABNORMAL LOW (ref 12.0–15.0)
MCH: 24.3 pg — ABNORMAL LOW (ref 26.0–34.0)
MCHC: 30.8 g/dL (ref 30.0–36.0)
MCV: 78.9 fL — ABNORMAL LOW (ref 80.0–100.0)
Platelets: 193 10*3/uL (ref 150–400)
RBC: 4.36 MIL/uL (ref 3.87–5.11)
RDW: 17.1 % — ABNORMAL HIGH (ref 11.5–15.5)
WBC: 14.6 10*3/uL — ABNORMAL HIGH (ref 4.0–10.5)
nRBC: 0 % (ref 0.0–0.2)

## 2021-07-24 LAB — IRON AND TIBC
Iron: 14 ug/dL — ABNORMAL LOW (ref 28–170)
Saturation Ratios: 6 % — ABNORMAL LOW (ref 10.4–31.8)
TIBC: 235 ug/dL — ABNORMAL LOW (ref 250–450)
UIBC: 221 ug/dL

## 2021-07-24 LAB — URINE CULTURE

## 2021-07-24 LAB — PROTIME-INR
INR: 1.3 — ABNORMAL HIGH (ref 0.8–1.2)
Prothrombin Time: 16.1 seconds — ABNORMAL HIGH (ref 11.4–15.2)

## 2021-07-24 LAB — FERRITIN: Ferritin: 334 ng/mL — ABNORMAL HIGH (ref 11–307)

## 2021-07-24 MED ORDER — LACTATED RINGERS IV BOLUS
1000.0000 mL | Freq: Once | INTRAVENOUS | Status: AC
  Administered 2021-07-24: 1000 mL via INTRAVENOUS

## 2021-07-24 MED ORDER — ENOXAPARIN SODIUM 40 MG/0.4ML IJ SOSY
40.0000 mg | PREFILLED_SYRINGE | INTRAMUSCULAR | Status: DC
  Administered 2021-07-24 – 2021-07-27 (×3): 40 mg via SUBCUTANEOUS
  Filled 2021-07-24 (×3): qty 0.4

## 2021-07-24 MED ORDER — SODIUM CHLORIDE 0.9 % IV SOLN: INTRAVENOUS | Status: DC | PRN

## 2021-07-24 MED ORDER — MUPIROCIN 2 % EX OINT
1.0000 "application " | TOPICAL_OINTMENT | Freq: Two times a day (BID) | CUTANEOUS | Status: AC
  Administered 2021-07-24 – 2021-07-28 (×10): 1 via NASAL
  Filled 2021-07-24 (×3): qty 22

## 2021-07-24 MED ORDER — PANTOPRAZOLE SODIUM 40 MG PO TBEC
40.0000 mg | DELAYED_RELEASE_TABLET | Freq: Two times a day (BID) | ORAL | Status: DC
  Administered 2021-07-24 – 2021-07-28 (×9): 40 mg via ORAL
  Filled 2021-07-24 (×9): qty 1

## 2021-07-24 MED ORDER — SODIUM CHLORIDE 0.9 % IV SOLN
2.0000 g | INTRAVENOUS | Status: DC
  Administered 2021-07-24 – 2021-07-27 (×4): 2 g via INTRAVENOUS
  Filled 2021-07-24 (×4): qty 20

## 2021-07-24 MED ORDER — BACLOFEN 10 MG PO TABS
10.0000 mg | ORAL_TABLET | Freq: Two times a day (BID) | ORAL | Status: DC
  Administered 2021-07-24 – 2021-07-28 (×8): 10 mg via ORAL
  Filled 2021-07-24 (×8): qty 1

## 2021-07-24 MED ORDER — FERROUS GLUCONATE 324 (38 FE) MG PO TABS
324.0000 mg | ORAL_TABLET | Freq: Every day | ORAL | Status: DC
  Administered 2021-07-24 – 2021-07-28 (×5): 324 mg via ORAL
  Filled 2021-07-24 (×5): qty 1

## 2021-07-24 MED ORDER — CHLORHEXIDINE GLUCONATE CLOTH 2 % EX PADS
6.0000 | MEDICATED_PAD | Freq: Every day | CUTANEOUS | Status: AC
  Administered 2021-07-24 – 2021-07-28 (×4): 6 via TOPICAL

## 2021-07-24 MED ORDER — WHITE PETROLATUM EX OINT
TOPICAL_OINTMENT | CUTANEOUS | Status: AC
  Administered 2021-07-24: 0.2
  Filled 2021-07-24: qty 28.35

## 2021-07-24 NOTE — Progress Notes (Signed)
°   07/24/21 0322  Assess: MEWS Score  Temp (!) 97.3 F (36.3 C)  BP (!) 80/51  Pulse Rate 69  Resp 19  SpO2 98 %  O2 Device Room Air  Assess: MEWS Score  MEWS Temp 0  MEWS Systolic 2  MEWS Pulse 0  MEWS RR 0  MEWS LOC 0  MEWS Score 2  MEWS Score Color Yellow  Assess: if the MEWS score is Yellow or Red  Were vital signs taken at a resting state? Yes  Focused Assessment Change from prior assessment (see assessment flowsheet)  Early Detection of Sepsis Score *See Row Information* Medium  MEWS guidelines implemented *See Row Information* Yes  Take Vital Signs  Increase Vital Sign Frequency  Yellow: Q 2hr X 2 then Q 4hr X 2, if remains yellow, continue Q 4hrs  Escalate  MEWS: Escalate Yellow: discuss with charge nurse/RN and consider discussing with provider and RRT  Notify: Charge Nurse/RN  Name of Charge Nurse/RN Notified Emmanuel,RN  Date Charge Nurse/RN Notified 07/24/21  Time Charge Nurse/RN Notified 0325  Notify: Provider  Provider Name/Title Masters,MD  Date Provider Notified 07/24/21  Time Provider Notified [phone]  Notification Type Page (text page)  Notification Reason Change in status  Provider response See new orders  Date of Provider Response 07/24/21  Time of Provider Response 0345

## 2021-07-24 NOTE — Progress Notes (Addendum)
Micro corrected the misidentification of GNR as GPC in one bottle. Vanc was ordered. D/w Dr. Marlou Sa and clarify that this is klebsiella bacteremia. Dc vanc protocol.  Onnie Boer, PharmD, BCIDP, AAHIVP, CPP Infectious Disease Pharmacist 07/24/2021 9:42 AM  Addendum  Madaline Brilliant to optimize cefepime to ceftriaxone until sensitivities are back.  Onnie Boer, PharmD, BCIDP, AAHIVP, CPP Infectious Disease Pharmacist 07/24/2021 9:50 AM

## 2021-07-24 NOTE — Progress Notes (Addendum)
HD#1 SUBJECTIVE:  Patient Summary: [name] is a 70 y.o. 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   Overnight Events: Patient became hypotensive overnight with readings of 80/51, 90/50. She was treated with 1L LR with improvement in BP. She also was reported to have two episodes of brown emesis. She was started on octreotide 2/2 concerns of UGIB and received Zofran which resolved her nausea. Repeat CBC was stable.   Interim History:  Patient evaluated at bedside this morning. She is resting comfortably in bed. Reports she feels better than yesterday. Breathing is improved. She reports that she is more fidgety this morning. She also endorses sweating more so in her upper body than her lower body. Also reports that she is concerned about MRSA positive screen.  OBJECTIVE:  Vital Signs: Vitals:   07/24/21 0149 07/24/21 0155 07/24/21 0322 07/24/21 0527  BP: (!) 89/59 (!) 90/50 (!) 80/51 100/61  Pulse: 86  69 70  Resp: 18  19 17   Temp: 98.1 F (36.7 C)  (!) 97.3 F (36.3 C) 97.8 F (36.6 C)  TempSrc: Oral  Oral Oral  SpO2: 99%  98% 97%  Weight:      Height:       Supplemental O2: Room Air SpO2: 97 %  Filed Weights   07/23/21 0700  Weight: 73.5 kg     Intake/Output Summary (Last 24 hours) at 07/24/2021 0806 Last data filed at 07/24/2021 0545 Gross per 24 hour  Intake 2057.74 ml  Output 1350 ml  Net 707.74 ml   Net IO Since Admission: 707.74 mL [07/24/21 0806]  Physical Exam: Constitutional: Ill-appearing female resting comfortably in bed. No acute distress noted. Cardio: Regular rate and rhythm. No murmurs, rubs, gallops. Pulm: Clear to auscultation bilaterally. Patient takes short shallow breaths mixed with normal respirations.  Speaks in full sentences without trouble. Abdomen: Soft, distended, hepatomegaly noted. Patient is unable to comment on tenderness due to paraplegia.  Negative for fluid wave. GU: Foley catheter in place. MSK: Negative for extremity edema. Moves bilateral upper extremities spontaneously. Paraplegia affecting bilateral lower extremities. Skin: Skin is warm and diffusely diaphoretic.  Neuro: Alert and oriented x3. Paraplegic. Psych: Normal mood and affect.  Patient Lines/Drains/Airways Status     Active Line/Drains/Airways     Name Placement date Placement time Site Days   Peripheral IV 07/23/21 20 G Anterior;Right Forearm 07/23/21  0741  Forearm  1   Peripheral IV 07/23/21 20 G Posterior;Right Forearm 07/23/21  0743  Forearm  1   Negative Pressure Wound Therapy Toe (Comment  which one) Anterior;Right 04/13/21  0827  --  102   Urethral Catheter Edison Nasuti RN 16 Fr. 04/11/21  1355  --  104   Urethral Catheter Charito,RN Straight-tip 22 Fr. 07/23/21  2120  Straight-tip  1   Incision (Closed) 06/11/16 Back Posterior;Mid 06/11/16  0230  -- 1869   Incision (Closed) 04/13/21 Leg Right 04/13/21  0808  -- 102   Incision - 1 Port Abdomen Lateral;Right;Upper 07/02/21  1052  -- 22   Pressure Injury 06/11/16 Deep Tissue Injury - Purple or maroon localized area of discolored intact skin or blood-filled blister due to damage of underlying soft tissue from pressure and/or shear. small abrasion like in apperance but is non blanchable but 06/11/16  0230  --  1869   Pressure Injury 06/11/16 Deep Tissue Injury - Purple or maroon localized area of discolored intact skin or blood-filled blister due to damage of underlying soft tissue from pressure and/or shear. small abrasion like pressure injury that is nonblanchable  06/11/16  0230  -- 1869   Pressure Injury 04/10/21 Buttocks Right;Left;Lower Stage 2 -  Partial thickness loss of dermis presenting as a shallow open injury with a red, pink wound bed without slough.  04/10/21  1446  -- 105   Pressure Injury 07/23/21 Sacrum Stage 2 -  Partial thickness loss of dermis presenting as a shallow open injury with a red, pink wound bed without slough. healed pressure injury 07/23/21  2110  -- 1   Wound / Incision (Open or Dehisced) 02/14/18 Other (Comment) Toe (Comment  which one) Left 02/14/18  1319  Toe (Comment  which one)  1256   Wound / Incision (Open or Dehisced) 07/02/21  Toe (Comment  which one) Anterior;Right dry skin 07/02/21  1803  Toe (Comment  which one)  22   Wound / Incision (Open or Dehisced) 07/02/21 Non-pressure wound Hip Anterior;Right per pt, started from abrasion 07/02/21  1818  Hip  22   Wound / Incision (Open or Dehisced) 07/02/21 Non-pressure wound started as abrasion 07/02/21  1820  --  22   Wound / Incision (Open or Dehisced) 07/02/21 Other (Comment) Vertebral column old scar 07/02/21  1829  Vertebral column  22   Wound / Incision (Open or Dehisced) 07/02/21 Other (Comment) Sacrum stage 2 07/02/21  1830  Sacrum  22   Wound / Incision (Open or Dehisced) 07/02/21 Perineum stage 2 07/02/21  1831  Perineum  22   Wound / Incision (Open or Dehisced) 07/23/21 Non-pressure wound Hip Anterior;Right 07/23/21  2110  Hip  1             ASSESSMENT/PLAN:  Assessment: Principal Problem:   UTI (urinary tract infection)  [name] is a 70 y.o. 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   Plan: #Sepsis 2/2 bacteremia, source: UTI #History of neurogenic bladder with indwelling foley catheter On initial presentation, patient had hematuria in setting of chronic indwelling Foley catheter.  At that time she was also febrile, tachycardiac, hypertensive, with leukocytosis.  Urinalysis was consistent with UTI, and CT  abdomen/pelvis noted moderate bilateral hydronephrosis (right greater than left).  After discussion with urology, her catheter was exchanged.  She was initiated on IV cefepime for sepsis secondary to UTI.  Since admission she has been afebrile though has had persistent hypotension requiring fluid resuscitation.  Her leukocytosis has gotten worse, now 14.6 from 13.7.  Blood culture resulted positive for Klebsiella pneumonia, urine cultures have not resulted. -Will transition from IV cefepime to IV Rocephin with total treatment duration of 7 days.  -Follow blood and urine cultures -Plan for repeat CT abdomen/pelvis in the next 1 to 2 days to monitor hydronephrosis  #Acute kidney injury #Non-anion gap metabolic acidosis AKI persists though is improving, creatinine improved to 1.35 from 1.57.  Metabolic acidosis is also improving at this time. -Encourage p.o. hydration -Trend renal function -Avoid nephrotoxic agents  #Hypotonic hyponatremia Hyponatremia is persistent though improved, up to 132.  Patient has had decreased oral intake over the last several days with combined intake of approximately 2  L.  Patient is diffusely diaphoretic on exam causing further fluid loss. -Encourage p.o. 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 Liver enzymes remain elevated with mild increases, AST 62 ALT 76 (AST 52 ALT 74 12/28) though of note she is septic.  Abdominal exam is negative for fluid wave and is soft though mildly distended, and patient endorses feeling bloated over the last several days.  There is hepatomegaly noted. -Trend LFTs -Resume home Lasix and spironolactone once blood pressure is stable  #Microcytic anemia Hgb mildly decreased to 10.6 from 11.7 but stable. Patient did have a reported episode of brown emesis however suspicion for GI bleed is low at this time. Iron panel  revealed mix results including iron 14, TIBC 235, Iron saturation 6, ferritin 334.  -Trend CBC -We will start patient on daily iron supplement  #History of hypertension Patient has been hypotensive during this admission, requiring fluid resuscitation with appropriate increase in blood pressure though still hypotensive. Currently holding home amlodipine and coreg. -Continue to hold come amlodipine and coreg -Encourage PO intake -IVF bolus as needed for pressure support  Best Practice: Diet: Cardiac diet IVF: Fluids: IV push only, no IV fluids, Rate: None VTE: enoxaparin (LOVENOX) injection 30 mg Start: 07/23/21 1300 Code: Full AB: Cefepime DISPO: Anticipated discharge to Skilled nursing facility pending Medical stability.  Signature: Farrel Gordon, D.O.  Internal Medicine Resident, PGY-1 [facility]  Pager: [phone] 8:06 AM, 07/24/2021   Please contact the on call pager after 5 pm and on weekends at [phone].

## 2021-07-24 NOTE — Progress Notes (Addendum)
PHARMACIST - PHYSICIAN COMMUNICATION  DR:   Philipp Ovens  CONCERNING: IV to Oral Route Change Policy  RECOMMENDATION: This patient is receiving Protonix by the intravenous route.  Based on criteria approved by the Pharmacy and Therapeutics Committee, the intravenous medication(s) is/are being converted to the equivalent oral dose form(s).   DESCRIPTION: These criteria include: The patient is eating (either orally or via tube) and/or has been taking other orally administered medications for a least 24 hours The patient has no evidence of active gastrointestinal bleeding or impaired GI absorption (gastrectomy, short bowel, patient on TNA or NPO).  If you have questions about this conversion, please contact the Pharmacy Department  []   [phone] )  [facility] []   [phone] )  [facility] [x]   [phone] )  [facility] []   [phone] )  [facility] []   [phone] )  [place] to change Lovenox to 40mg  qday per Dr Marlou Sa.  Onnie Boer, PharmD, BCIDP, AAHIVP, CPP Infectious Disease Pharmacist 07/24/2021 8:49 AM

## 2021-07-24 NOTE — Progress Notes (Signed)
Patient's BP 80/51 HR 69 T 97.3, patient has Yellow MEWS score. Masters,MD text paged. Order received in epic. Will continue to monitor.

## 2021-07-24 NOTE — Evaluation (Signed)
Occupational Therapy Evaluation Patient Details Name: [name] MRN: [id number] DOB: 03/29/1951 Today's Date: 07/24/2021   History of Present Illness Pt is a 70 y/o female admitted secondary to hematuria. Pt found to have sepsis secondary to a UTI. 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   Clinical Impression   Pt demonstrates baseline level of function with ADLs/selfcare.  Pt reports that PTA, she was from a SNF (has been there for 13 yrs), requires extensive -  total with most ADLs/selfcare, uses a power w/c as her primary means of mobility, requires the use of a Hoyer lift for transfers. Pt still able to feed and groom herself after set up and perform UB ADLs at bed level. Pt stated that she feels she is at her baseline level of function with selfcare. All education completed and no further acute OT services are indicated at this time, OT will sign off     Recommendations for follow up therapy are one component of a multi-disciplinary discharge planning process, led by the attending physician.  Recommendations may be updated based on patient status, additional functional criteria and insurance authorization.   Follow Up Recommendations  Skilled nursing-short term rehab (<3 hours/day) (return to LTC facility)    Assistance Recommended at Discharge Frequent or constant Supervision/Assistance  Functional Status Assessment  Patient has not had a recent decline in their functional status  Equipment Recommendations  None recommended by OT    Recommendations for Other Services       Precautions / Restrictions Precautions Precautions: Fall Precaution Comments: B LE contractures Restrictions Weight Bearing Restrictions: No      Mobility Bed Mobility Overal bed mobility: Needs  Assistance Bed Mobility: Supine to Sit;Sit to Supine     Supine to sit: Mod assist Sit to supine: Mod assist   General bed mobility comments: pt able to use B UEs grasping bed rails to assist with pulling her trunk off of the back support of the bed to achieve a long sitting position with max - mod A from. Pt unable to sit upright without physical assist, external support or back support    Transfers                   General transfer comment: requires a mechanical lift at baseline      Balance Overall balance assessment: Needs assistance Sitting-balance support: Bilateral upper extremity supported Sitting balance-Leahy Scale: Poor                                     ADL either performed or assessed with clinical judgement   ADL Overall ADL's : At baseline                                       General ADL Comments: pt reports that at baseline that she was from a SNF (has been there for 13 yrs), requires extensive assist with ADLs but can feed herslef and do as much if her UB ADLs as she can at bed level.     Vision Baseline Vision/History: 1 Wears glasses Ability to See in Adequate Light: 0 Adequate Patient Visual Report: No change from baseline  Perception     Praxis      Pertinent Vitals/Pain Pain Assessment: No/denies pain     Hand Dominance Right   Extremity/Trunk Assessment Upper Extremity Assessment Upper Extremity Assessment: Overall WFL for tasks assessed   Lower Extremity Assessment Lower Extremity Assessment: Defer to PT evaluation       Communication Communication Communication: No difficulties   Cognition Arousal/Alertness: Awake/alert Behavior During Therapy: WFL for tasks assessed/performed Overall Cognitive Status: Within Functional Limits for tasks assessed                                       General Comments       Exercises     Shoulder Instructions      Home Living  Family/patient expects to be discharged to:: Skilled nursing facility                                 Additional Comments: sleeps in a hospital bed with an airmatress      Prior Functioning/Environment Prior Level of Function : Needs assist       Physical Assist : Mobility (physical);ADLs (physical)     Mobility Comments: pt uses a power w/c as her primary means of mobility; she requires assistance from SNF staff for transfers using a Hoyer lift ADLs Comments: pt requires lots of extensive for ADLs; however, she can feed herself and stated that she helps with UB bathing/dressing as much as she can        OT Problem List: Impaired balance (sitting and/or standing);Decreased activity tolerance      OT Treatment/Interventions:      OT Goals(Current goals can be found in the care plan section) Acute Rehab OT Goals Patient Stated Goal: "get better" OT Goal Formulation: With patient  OT Frequency:     Barriers to D/C:            Co-evaluation              AM-PAC OT "6 Clicks" Daily Activity     Outcome Measure Help from another person eating meals?: None Help from another person taking care of personal grooming?: None (set up) Help from another person toileting, which includes using toliet, bedpan, or urinal?: Total Help from another person bathing (including washing, rinsing, drying)?: A Lot Help from another person to put on and taking off regular upper body clothing?: A Little Help from another person to put on and taking off regular lower body clothing?: Total 6 Click Score: 15   End of Session    Activity Tolerance: Patient tolerated treatment well Patient left: in bed;with call bell/phone within reach;with bed alarm set  OT Visit Diagnosis: Other abnormalities of gait and mobility (R26.89)                Time: 8938-1017 OT Time Calculation (min): 19 min Charges:  OT General Charges $OT Visit: 1 Visit OT Evaluation $OT Eval Moderate  Complexity: 1 Mod    Britt Bottom 07/24/2021, 2:53 PM

## 2021-07-24 NOTE — Evaluation (Addendum)
Physical Therapy Evaluation Patient Details Name: [name] MRN: [id number] DOB: 04-03-1951 Today's Date: 07/24/2021  History of Present Illness  Pt is a 70 y/o female admitted secondary to hematuria. Pt found to have sepsis secondary to a UTI. 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   Clinical Impression  Pt presented supine in bed with HOB elevated, awake and willing to participate in therapy session. Prior to admission, pt reported that she was from a SNF (has been there for 13 yrs), uses a power w/c as her primary means of mobility, requires the use of a Hoyer lift for transfers and assistance with mobility and ADLs from SNF staff. At the time of evaluation, pt required mod A for bed mobility and mod A with use of bilateral UEs on bed rails to maintain a long sitting position in bed. Her bilateral LEs have flexion contractures throughout secondary to her previous SCI (almost 40 yrs ago). Pt stated that she feels she is at her baseline in regards to functional mobility at this time. No further acute PT needs identified. PT signing off.     PT spoke with RN at end of session and requested PRAFO boots to be ordered for pt for skin protection of her bilateral feet while admitted. RN stated she would contact pt's MD for order.     Recommendations for follow up therapy are one component of a multi-disciplinary discharge planning process, led by the attending physician.  Recommendations may be updated based on patient status, additional functional criteria and insurance authorization.  Follow Up Recommendations Skilled nursing-short term rehab (<3 hours/day)    Assistance Recommended at Discharge Frequent or constant Supervision/Assistance  Functional Status Assessment Patient has not had a recent  decline in their functional status  Equipment Recommendations  None recommended by PT    Recommendations for Other Services       Precautions / Restrictions Precautions Precautions: Fall Precaution Comments: bilateral LE contractures Restrictions Weight Bearing Restrictions: No      Mobility  Bed Mobility Overal bed mobility: Needs Assistance Bed Mobility: Supine to Sit;Sit to Supine     Supine to sit: Mod assist Sit to supine: Mod assist   General bed mobility comments: pt able to use bilateral UEs on bed rails to assist with pulling her trunk off of the back support of the bed to achieve a long sitting position with mod A from PT. Pt unable to sit upright without physical assist, external support or back support    Transfers                   General transfer comment: requires a mechanical lift at baseline    Ambulation/Gait               General Gait Details: does not ambulate at baseline - uses a power w/c  Stairs            Wheelchair Mobility    Modified Rankin (Stroke Patients Only)       Balance Overall balance assessment: Needs assistance Sitting-balance support: Bilateral upper extremity supported Sitting balance-Leahy Scale: Poor                                       Pertinent Vitals/Pain Pain  Assessment: No/denies pain    Home Living Family/patient expects to be discharged to:: Skilled nursing facility                   Additional Comments: sleeps in a hospital bed with an airmatress    Prior Function Prior Level of Function : Needs assist       Physical Assist : Mobility (physical);ADLs (physical)     Mobility Comments: pt uses a power w/c as her primary means of mobility; she requires assistance from SNF staff for transfers using a Hoyer lift ADLs Comments: pt requires lots of assistance for ADLs; however, she can feed herself and stated that she helps with bathing/dressing as much as she  can     Hand Dominance        Extremity/Trunk Assessment   Upper Extremity Assessment Upper Extremity Assessment: Defer to OT evaluation    Lower Extremity Assessment Lower Extremity Assessment: RLE deficits/detail;LLE deficits/detail RLE Deficits / Details: flexion contractures throughout with no sensation throughough due to previous SCI LLE Deficits / Details: flexion contractures throughout with no sensation throughough due to previous SCI       Communication   Communication: No difficulties  Cognition Arousal/Alertness: Awake/alert Behavior During Therapy: Restless Overall Cognitive Status: Within Functional Limits for tasks assessed                                          General Comments      Exercises     Assessment/Plan    PT Assessment All further PT needs can be met in the next venue of care  PT Problem List Decreased strength;Decreased range of motion;Decreased balance;Decreased mobility;Decreased coordination;Decreased knowledge of precautions;Impaired sensation       PT Treatment Interventions      PT Goals (Current goals can be found in the Care Plan section)  Acute Rehab PT Goals Patient Stated Goal: to get better PT Goal Formulation: All assessment and education complete, DC therapy    Frequency     Barriers to discharge        Co-evaluation               AM-PAC PT "6 Clicks" Mobility  Outcome Measure Help needed turning from your back to your side while in a flat bed without using bedrails?: A Lot Help needed moving from lying on your back to sitting on the side of a flat bed without using bedrails?: A Lot Help needed moving to and from a bed to a chair (including a wheelchair)?: Total Help needed standing up from a chair using your arms (e.g., wheelchair or bedside chair)?: Total Help needed to walk in hospital room?: Total Help needed climbing 3-5 steps with a railing? : Total 6 Click Score: 8    End of  Session   Activity Tolerance: Patient tolerated treatment well Patient left: in bed;with call bell/phone within reach;with chair alarm set;Other (comment) (RN present) Nurse Communication: Mobility status PT Visit Diagnosis: Other abnormalities of gait and mobility (R26.89)    Time: 7482-7078 PT Time Calculation (min) (ACUTE ONLY): 23 min   Charges:   PT Evaluation $PT Eval Low Complexity: 1 Low PT Treatments $Therapeutic Activity: 8-22 mins        Anastasio Champion, DPT  Acute Rehabilitation Services Office [place] 07/24/2021, 10:50 AM

## 2021-07-24 NOTE — Progress Notes (Signed)
PHARMACY - PHYSICIAN COMMUNICATION CRITICAL VALUE ALERT - BLOOD CULTURE IDENTIFICATION (BCID)  [name] is an 69 y.o. female who presented to [facility] on 07/23/2021 with a chief complaint of hematuria/UTI  Assessment:   1/2 blood cultures growing Klebsiella pneumoniae  Name of physician (or Provider) Contacted:  Dr. Eulas Post  Current antibiotics:  Cefepime  Changes to prescribed antibiotics recommended:  Consider narrowing to Rocephin 2 g IV q24h  Results for orders placed or performed during the hospital encounter of 07/23/21  Blood Culture ID Panel (Reflexed) (Collected: 07/23/2021  7:33 AM)  Result Value Ref Range   Enterococcus faecalis NOT DETECTED NOT DETECTED   Enterococcus Faecium NOT DETECTED NOT DETECTED   Listeria monocytogenes NOT DETECTED NOT DETECTED   Staphylococcus species NOT DETECTED NOT DETECTED   Staphylococcus aureus (BCID) NOT DETECTED NOT DETECTED   Staphylococcus epidermidis NOT DETECTED NOT DETECTED   Staphylococcus lugdunensis NOT DETECTED NOT DETECTED   Streptococcus species NOT DETECTED NOT DETECTED   Streptococcus agalactiae NOT DETECTED NOT DETECTED   Streptococcus pneumoniae NOT DETECTED NOT DETECTED   Streptococcus pyogenes NOT DETECTED NOT DETECTED   A.calcoaceticus-baumannii NOT DETECTED NOT DETECTED   Bacteroides fragilis NOT DETECTED NOT DETECTED   Enterobacterales DETECTED (A) NOT DETECTED   Enterobacter cloacae complex NOT DETECTED NOT DETECTED   Escherichia coli NOT DETECTED NOT DETECTED   Klebsiella aerogenes NOT DETECTED NOT DETECTED   Klebsiella oxytoca NOT DETECTED NOT DETECTED   Klebsiella pneumoniae DETECTED (A) NOT DETECTED   Proteus species NOT DETECTED NOT DETECTED   Salmonella species NOT DETECTED NOT DETECTED   Serratia marcescens NOT DETECTED NOT DETECTED   Haemophilus influenzae NOT DETECTED NOT DETECTED   Neisseria meningitidis NOT DETECTED NOT DETECTED   Pseudomonas aeruginosa NOT DETECTED NOT DETECTED    Stenotrophomonas maltophilia NOT DETECTED NOT DETECTED   Candida albicans NOT DETECTED NOT DETECTED   Candida auris NOT DETECTED NOT DETECTED   Candida glabrata NOT DETECTED NOT DETECTED   Candida krusei NOT DETECTED NOT DETECTED   Candida parapsilosis NOT DETECTED NOT DETECTED   Candida tropicalis NOT DETECTED NOT DETECTED   Cryptococcus neoformans/gattii NOT DETECTED NOT DETECTED   CTX-M ESBL NOT DETECTED NOT DETECTED   Carbapenem resistance IMP NOT DETECTED NOT DETECTED   Carbapenem resistance KPC NOT DETECTED NOT DETECTED   Carbapenem resistance NDM NOT DETECTED NOT DETECTED   Carbapenem resist OXA 48 LIKE NOT DETECTED NOT DETECTED   Carbapenem resistance VIM NOT DETECTED NOT DETECTED    Caryl Pina 07/24/2021  2:15 AM

## 2021-07-24 NOTE — Progress Notes (Addendum)
Paged due to BP of 90/50 at 0205 to 80/51 at 0333.  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  She had two episodes of brown emesis and received zofran with resolution of nausea/v.    Blood pressure (!) 80/51, pulse 69, temperature (!) 97.3 F (36.3 C), temperature source Oral, resp. rate 19, height 5\' 5"  (1.651 m), weight 73.5 kg, SpO2 98 %.   Gen:in no acute distress, chronically ill appearing Heart: RRR, no murmurs Lungs: normal pulmonary effort, CTAB Abd: soft, normoactive bowel sounds in all 4 quadrants Psych: alert &oriented x3, she states that she feels more restless than normal, and she accidentally called her sister earlier rather than the nurse  A/P: On admission patient with systolic's in the 98'P to 100's, home BP medications (Amlodipine, Coreg, Lasix, spiro) held.  Patient was Alert and oriented to self, place, and situation. Patient had received 1 L of LR. Will order 1 L of LR. Repeat CBC appears stable with mild decrease in hgb due to hemodilution.

## 2021-07-24 NOTE — Progress Notes (Signed)
Patient's BP 89/59 HR 86 T 98.1 O2 sat 99% on RA . Manual recheck 90/50. Masters,MD text paged. No new orders. Will continue to monitor.

## 2021-07-25 LAB — COMPREHENSIVE METABOLIC PANEL
ALT: 54 U/L — ABNORMAL HIGH (ref 0–44)
AST: 40 U/L (ref 15–41)
Albumin: 2.5 g/dL — ABNORMAL LOW (ref 3.5–5.0)
Alkaline Phosphatase: 104 U/L (ref 38–126)
Anion gap: 8 (ref 5–15)
BUN: 44 mg/dL — ABNORMAL HIGH (ref 8–23)
CO2: 20 mmol/L — ABNORMAL LOW (ref 22–32)
Calcium: 9.1 mg/dL (ref 8.9–10.3)
Chloride: 104 mmol/L (ref 98–111)
Creatinine, Ser: 1.14 mg/dL — ABNORMAL HIGH (ref 0.44–1.00)
GFR, Estimated: 52 mL/min — ABNORMAL LOW (ref >60)
Glucose, Bld: 124 mg/dL — ABNORMAL HIGH (ref 70–99)
Potassium: 4 mmol/L (ref 3.5–5.1)
Sodium: 132 mmol/L — ABNORMAL LOW (ref 135–145)
Total Bilirubin: 0.4 mg/dL (ref 0.3–1.2)
Total Protein: 7.2 g/dL (ref 6.5–8.1)

## 2021-07-25 LAB — CBC
HCT: 32.2 % — ABNORMAL LOW (ref 36.0–46.0)
Hemoglobin: 10 g/dL — ABNORMAL LOW (ref 12.0–15.0)
MCH: 24.4 pg — ABNORMAL LOW (ref 26.0–34.0)
MCHC: 31.1 g/dL (ref 30.0–36.0)
MCV: 78.5 fL — ABNORMAL LOW (ref 80.0–100.0)
Platelets: 219 10*3/uL (ref 150–400)
RBC: 4.1 MIL/uL (ref 3.87–5.11)
RDW: 17.7 % — ABNORMAL HIGH (ref 11.5–15.5)
WBC: 11.2 10*3/uL — ABNORMAL HIGH (ref 4.0–10.5)
nRBC: 0 % (ref 0.0–0.2)

## 2021-07-25 LAB — URINE CULTURE: Culture: NO GROWTH

## 2021-07-25 NOTE — Progress Notes (Signed)
HD#2 SUBJECTIVE:  Patient Summary: [name] is a 70 y.o. 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    Overnight Events: None  Interim History: Patient reports continued gradual improvement in how she is feeling, however still has some abdominal pain and discomfort with breathing.  She reports brief chest discomfort yesterday, however has also had large amounts of gas and contributes this to indigestion.  OBJECTIVE:  Vital Signs: Vitals:   07/24/21 2017 07/25/21 0516 07/25/21 0740 07/25/21 0930  BP: (!) 94/58 (!) 90/51 (!) 94/51 (!) 102/58  Pulse: 78 67 71 73  Resp: 17 19 18 16   Temp: 99.7 F (37.6 C) 98.5 F (36.9 C) 99 F (37.2 C) 99 F (37.2 C)  TempSrc:  Oral Oral   SpO2: 99% 98% 97% 99%  Weight:      Height:       Supplemental O2: Room Air SpO2: 99 %  Filed Weights   07/23/21 0700  Weight: 73.5 kg     Intake/Output Summary (Last 24 hours) at 07/25/2021 0935 Last data filed at 07/25/2021 5188 Gross per 24 hour  Intake 693.49 ml  Output 1450 ml  Net -756.51 ml   Net IO Since Admission: 191.23 mL [07/25/21 0935]  Physical Exam: Constitutional: Pleasant female resting company in bed, no acute distress noted. Cardio: Regular rate and rhythm.  No murmurs, rubs, gallops. Pulm: Clear to auscultation bilaterally. Abdomen: Soft, nondistended, nontender. GU: Foley catheter in place. MSK: Negative for extremity edema.  Moves bilateral upper extremities spontaneously.  Paraplegia affecting lower extremities. Skin: Skin is warm.  Upper extremities are dry, lower extremities are diaphoretic. Neuro: Alert and oriented x3. Psych: Normal mood and affect.  Patient Lines/Drains/Airways Status     Active  Line/Drains/Airways     Name Placement date Placement time Site Days   Peripheral IV 07/23/21 20 G Anterior;Right Forearm 07/23/21  0741  Forearm  2   Peripheral IV 07/23/21 20 G Posterior;Right Forearm 07/23/21  0743  Forearm  2   Negative Pressure Wound Therapy Toe (Comment  which one) Anterior;Right 04/13/21  0827  --  103   Urethral Catheter [name] Nasuti RN 16 Fr. 04/11/21  1355  --  105   Urethral Catheter [name] Straight-tip 22 Fr. 07/23/21  2120  Straight-tip  2   Incision (Closed) 06/11/16 Back Posterior;Mid 06/11/16  0230  -- 1870   Incision (Closed) 04/13/21 Leg Right 04/13/21  0808  -- 103   Incision - 1 Port Abdomen Lateral;Right;Upper 07/02/21  1052  -- 23   Pressure Injury 06/11/16 Deep Tissue Injury - Purple or maroon localized area of discolored intact skin or blood-filled blister due to damage of underlying soft tissue from pressure and/or shear. small abrasion like in apperance but is non blanchable but 06/11/16  0230  -- 1870   Pressure Injury 06/11/16 Deep Tissue Injury - Purple or maroon localized area of discolored intact skin or blood-filled blister due to damage of underlying soft tissue from pressure and/or shear. small abrasion like pressure injury that is nonblanchable  06/11/16  0230  -- 1870   Pressure Injury 04/10/21 Buttocks Right;Left;Lower Stage 2 -  Partial thickness loss of dermis presenting as a shallow open injury with a red, pink wound bed without slough. 04/10/21  1446  -- 106  Pressure Injury 07/23/21 Sacrum Stage 2 -  Partial thickness loss of dermis presenting as a shallow open injury with a red, pink wound bed without slough. healed pressure injury 07/23/21  2110  -- 2   Wound / Incision (Open or Dehisced) 02/14/18 Other (Comment) Toe (Comment  which one) Left 02/14/18  1319  Toe (Comment  which one)  1257   Wound / Incision (Open or Dehisced) 07/02/21  Toe (Comment  which one) Anterior;Right dry skin 07/02/21  1803  Toe (Comment  which one)  23    Wound / Incision (Open or Dehisced) 07/02/21 Non-pressure wound Hip Anterior;Right per pt, started from abrasion 07/02/21  1818  Hip  23   Wound / Incision (Open or Dehisced) 07/02/21 Non-pressure wound started as abrasion 07/02/21  1820  --  23   Wound / Incision (Open or Dehisced) 07/02/21 Other (Comment) Vertebral column old scar 07/02/21  1829  Vertebral column  23   Wound / Incision (Open or Dehisced) 07/02/21 Other (Comment) Sacrum stage 2 07/02/21  1830  Sacrum  23   Wound / Incision (Open or Dehisced) 07/02/21 Perineum stage 2 07/02/21  1831  Perineum  23   Wound / Incision (Open or Dehisced) 07/23/21 Non-pressure wound Hip Anterior;Right 07/23/21  2110  Hip  2             ASSESSMENT/PLAN:  Assessment: Principal Problem:   UTI (urinary tract infection) Active Problems:   AKI (acute kidney injury) ([place])   Bacteremia due to Klebsiella pneumoniae  [name] is a 70 y.o. 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    Plan: #Sepsis 2/2 bacteremia, source: UTI #History of neurogenic bladder with indwelling foley catheter Antibiotics changed from IV cefepime to IV Rocephin 12/29.  Susceptibilities are still pending.  Leukocytosis continue to improve, down to 11.2 from 14.6.  Urine culture has not resulted, and as previously noted blood culture grew Klebsiella pneumonia.   -Continue IV Rocephin with total treatment duration of 7 days.  -Follow blood and urine cultures -Plan for repeat CT abdomen/pelvis tomorrow to monitor hydronephrosis   #Acute kidney injury #Non-anion gap metabolic acidosis AKI persists though is improving, creatinine improved to 1.14 from 1.35.  Metabolic acidosis is unchanged. -Encourage p.o.  hydration -Trend renal function -Avoid nephrotoxic agents   #Hypotonic hyponatremia Hyponatremia is persistent though stable at 132.  Patient has had decreased oral intake over the last several days and continues to have lower extremity diuresis causing further fluid loss. -Encourage p.o. 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 Liver enzymes remain elevated although improving.  Abdomen is not distended today, though the patient reports continued mild discomfort.   -Trend LFTs -Resume home Lasix and spironolactone once blood pressure is stable   #Microcytic anemia Hgb stable.  She was started on iron supplementation 12/29. -Trend CBC -Continue daily iron supplement   #History of hypertension Patient has been hypotensive during this admission, requiring fluid resuscitation with appropriate increase in blood pressure though still hypotensive. Currently holding home amlodipine and coreg. -Continue to hold come amlodipine and coreg -Encourage PO intake -IVF bolus as needed for pressure support, MAP goal greater than 65  Best Practice: Diet:  Cardiac diet IVF: Fluids: IV push only, no IV fluids VTE: enoxaparin (LOVENOX) injection 40 mg Start: 07/24/21 1300 Code: Full AB: Rocephin IV 2 g daily Therapy Recs: SNF, DME: none DISPO: Anticipated discharge to Skilled nursing facility pending Medical stability.  Signature: Farrel Gordon, D.O.  Internal Medicine Resident, PGY-1 [name] Res[facility]  Pager: [phone] 9:35 AM, 07/25/2021   Please contact the on call pager after 5 pm and on weekends at [phone].

## 2021-07-25 NOTE — Hospital Course (Addendum)
#  Sepsis 2/2 bacteremia, source: UTI #History of neurogenic bladder with indwelling foley catheter Septic presentation with fever T-max 102.5, tachycardia, hypotension, leukocytosis, and source identified through urinalysis which was consistent with UTI.  CT abdomen/pelvis did not show evidence of pyelonephritis, however there was moderate bilateral hydronephrosis with right greater than left.  At this time, her catheter was exchanged.  In the ED she received IV vancomycin, cefepime, and Flagyl.  She was continued on IV cefepime through 10/29, at which point she was transitioned to IV Rocephin.  Blood cultures did result positive for Klebsiella pneumoniae.   #Acute kidney injury #Non-anion gap metabolic acidosis On admission, patient endorsed decreased oral intake over the last several days and was noted to have an AKI.  She was given fluid resuscitation with gradual improvement and resolution of her AKI.   #Hypotonic hyponatremia Hyponatremia on admission noted to be likely secondary to decreased oral intake.  This was stable throughout her admission.   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  She did not have or develop ascites.  Because of hypotension, home Lasix and spironolactone were held. Repeat imaging to assess for resolution of hydronephrosis was significant for possible enlargement of the previously noted right posterior lobe lesion.  This finding was discussed with IR who felt that it could be related to post ablation changes as patient underwent CT macro ablation on 07/02/2021, and recommended that the patient follow-up with IR on discharge and further management decisions can be made at that time.   #Microcytic anemia Patient initially had hematuria, however never showed signs of overt bleeding.  Her hemoglobin remained stable throughout admission, however iron studies did suggest possible iron deficiency.  She was started on  iron supplementation.   #History of hypertension In the setting of septic shock and persistent hypotension, home amlodipine and Coreg were held.

## 2021-07-25 NOTE — Care Management Important Message (Signed)
Important Message  Patient Details  Name: [name] MRN: [id number] Date of Birth: April 04, 1951   Medicare Important Message Given:  Yes     Orbie Pyo 07/25/2021, 2:13 PM

## 2021-07-26 ENCOUNTER — Inpatient Hospital Stay (HOSPITAL_COMMUNITY): Payer: Medicare Other

## 2021-07-26 DIAGNOSIS — B961 Klebsiella pneumoniae [K. pneumoniae] as the cause of diseases classified elsewhere: Secondary | ICD-10-CM | POA: Diagnosis not present

## 2021-07-26 DIAGNOSIS — R7881 Bacteremia: Secondary | ICD-10-CM | POA: Diagnosis not present

## 2021-07-26 LAB — COMPREHENSIVE METABOLIC PANEL
ALT: 49 U/L — ABNORMAL HIGH (ref 0–44)
AST: 41 U/L (ref 15–41)
Albumin: 2.4 g/dL — ABNORMAL LOW (ref 3.5–5.0)
Alkaline Phosphatase: 109 U/L (ref 38–126)
Anion gap: 7 (ref 5–15)
BUN: 31 mg/dL — ABNORMAL HIGH (ref 8–23)
CO2: 19 mmol/L — ABNORMAL LOW (ref 22–32)
Calcium: 8.5 mg/dL — ABNORMAL LOW (ref 8.9–10.3)
Chloride: 106 mmol/L (ref 98–111)
Creatinine, Ser: 0.82 mg/dL (ref 0.44–1.00)
GFR, Estimated: >60 mL/min (ref >60)
Glucose, Bld: 113 mg/dL — ABNORMAL HIGH (ref 70–99)
Potassium: 3.3 mmol/L — ABNORMAL LOW (ref 3.5–5.1)
Sodium: 132 mmol/L — ABNORMAL LOW (ref 135–145)
Total Bilirubin: 0.3 mg/dL (ref 0.3–1.2)
Total Protein: 7.3 g/dL (ref 6.5–8.1)

## 2021-07-26 LAB — CBC
HCT: 31.7 % — ABNORMAL LOW (ref 36.0–46.0)
Hemoglobin: 10.1 g/dL — ABNORMAL LOW (ref 12.0–15.0)
MCH: 24.9 pg — ABNORMAL LOW (ref 26.0–34.0)
MCHC: 31.9 g/dL (ref 30.0–36.0)
MCV: 78.1 fL — ABNORMAL LOW (ref 80.0–100.0)
Platelets: 211 10*3/uL (ref 150–400)
RBC: 4.06 MIL/uL (ref 3.87–5.11)
RDW: 17.6 % — ABNORMAL HIGH (ref 11.5–15.5)
WBC: 8.8 10*3/uL (ref 4.0–10.5)
nRBC: 0 % (ref 0.0–0.2)

## 2021-07-26 LAB — SARS CORONAVIRUS 2 (TAT 6-24 HRS): SARS Coronavirus 2: NEGATIVE

## 2021-07-26 MED ORDER — POTASSIUM CHLORIDE CRYS ER 20 MEQ PO TBCR
40.0000 meq | EXTENDED_RELEASE_TABLET | Freq: Once | ORAL | Status: AC
  Administered 2021-07-26: 40 meq via ORAL
  Filled 2021-07-26: qty 2

## 2021-07-26 MED ORDER — POTASSIUM CHLORIDE 20 MEQ PO PACK
40.0000 meq | PACK | ORAL | Status: DC
  Administered 2021-07-26: 40 meq via ORAL
  Filled 2021-07-26 (×2): qty 2

## 2021-07-26 NOTE — Progress Notes (Signed)
HD#3 SUBJECTIVE:  Patient Summary: [name] is a 70 y.o. 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    Overnight Events: None  Interim History: Patient reports that she continues to feel better with each day.  She does feel little weak, however thinks that maybe it is because she has been lying in bed for prolonged period of time.  She has had some constipation as well.  OBJECTIVE:  Vital Signs: Vitals:   07/25/21 0740 07/25/21 0930 07/25/21 1900 07/26/21 0425  BP: (!) 94/51 (!) 102/58 98/60 115/62  Pulse: 71 73 76 70  Resp: 18 16 16 17   Temp: 99 F (37.2 C) 99 F (37.2 C) 98.8 F (37.1 C) 98.4 F (36.9 C)  TempSrc: Oral  Oral Oral  SpO2: 97% 99% 100% 100%  Weight:   74.1 kg 74.2 kg  Height:       Supplemental O2: Room Air SpO2: 100 %  Filed Weights   07/23/21 0700 07/25/21 1900 07/26/21 0425  Weight: 73.5 kg 74.1 kg 74.2 kg     Intake/Output Summary (Last 24 hours) at 07/26/2021 0810 Last data filed at 07/26/2021 0415 Gross per 24 hour  Intake 454 ml  Output 600 ml  Net -146 ml   Net IO Since Admission: 165.23 mL [07/26/21 0810]  Physical Exam: Constitutional: Pleasant female resting comfortably in bed, no acute distress noted. Cardio: Regular rate and rhythm.  No murmurs, rubs, gallops. Pulm: Clear to auscultation bilaterally. Abdomen: Soft, nontender, nondistended. MSK: Negative for extremity edema. Skin: Skin is warm and dry. Neuro: Alert and oriented x3.   Psych: Normal mood and affect.  Patient Lines/Drains/Airways Status     Active Line/Drains/Airways     Name Placement date Placement time Site Days   Peripheral IV 07/23/21 20 G Anterior;Right Forearm 07/23/21  0741  Forearm  3   Peripheral IV  07/23/21 20 G Posterior;Right Forearm 07/23/21  0743  Forearm  3   Negative Pressure Wound Therapy Toe (Comment  which one) Anterior;Right 04/13/21  0827  --  104   Urethral Catheter Edison Nasuti RN 16 Fr. 04/11/21  1355  --  106   Urethral Catheter Charito,RN Straight-tip 104 Fr. 07/23/21  2120  Straight-tip  3   Incision (Closed) 06/11/16 Back Posterior;Mid 06/11/16  0230  -- 1871   Incision (Closed) 04/13/21 Leg Right 04/13/21  0808  -- 104   Incision - 1 Port Abdomen Lateral;Right;Upper 07/02/21  1052  -- 24   Pressure Injury 06/11/16 Deep Tissue Injury - Purple or maroon localized area of discolored intact skin or blood-filled blister due to damage of underlying soft tissue from pressure and/or shear. small abrasion like in apperance but is non blanchable but 06/11/16  0230  -- 1871   Pressure Injury 06/11/16 Deep Tissue Injury - Purple or maroon localized area of discolored intact skin or blood-filled blister due to damage of underlying soft tissue from pressure and/or shear. small abrasion like pressure injury that is nonblanchable  06/11/16  0230  -- 1871   Pressure Injury 04/10/21 Buttocks Right;Left;Lower Stage 2 -  Partial thickness loss of dermis presenting as a shallow open injury with a red, pink wound bed without slough. 04/10/21  1446  -- 107   Pressure Injury 07/23/21 Sacrum Stage 2 -  Partial thickness loss of dermis presenting as a shallow open injury with a red, pink wound bed without slough. healed pressure injury 07/23/21  2110  -- 3   Wound / Incision (Open or Dehisced) 02/14/18 Other (Comment) Toe (Comment  which one) Left 02/14/18  1319  Toe (Comment  which one)  1258   Wound / Incision (Open or Dehisced) 07/02/21  Toe (Comment  which one) Anterior;Right dry skin 07/02/21  1803  Toe (Comment  which one)  24   Wound / Incision (Open or Dehisced) 07/02/21 Non-pressure wound Hip Anterior;Right per pt, started from abrasion 07/02/21  1818  Hip  24   Wound / Incision (Open or  Dehisced) 07/02/21 Non-pressure wound started as abrasion 07/02/21  1820  --  24   Wound / Incision (Open or Dehisced) 07/02/21 Other (Comment) Vertebral column old scar 07/02/21  1829  Vertebral column  24   Wound / Incision (Open or Dehisced) 07/02/21 Other (Comment) Sacrum stage 2 07/02/21  1830  Sacrum  24   Wound / Incision (Open or Dehisced) 07/02/21 Perineum stage 2 07/02/21  1831  Perineum  24   Wound / Incision (Open or Dehisced) 07/23/21 Non-pressure wound Hip Anterior;Right 07/23/21  2110  Hip  3             ASSESSMENT/PLAN:  Assessment: Principal Problem:   Bacteremia due to Klebsiella pneumoniae Active Problems:   UTI (urinary tract infection)   AKI (acute kidney injury) ([place])   [name] is a 70 y.o. 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  Plan: #Sepsis 2/2 bacteremia, source: UTI #History of neurogenic bladder with indwelling foley catheter Currently being treated with IV Rocephin..  Susceptibilities are still pending.  Leukocytosis has resolved.  Urine culture shows no growth to date, and as previously noted blood culture grew Klebsiella pneumonia.   -Continue IV Rocephin with total treatment duration of 7 days.   -Follow blood and urine cultures, as well as susceptibilities -Repeat CT abdomen/pelvis today to monitor resolution of hydronephrosis   #Acute kidney injury #Non-anion gap metabolic acidosis AKI has resolved.  Metabolic acidosis is unchanged. -Encourage p.o. hydration -Trend renal function -Avoid nephrotoxic agents   #Hypotonic hyponatremia Hyponatremia is persistent though stable at 132.  Patient has had decreased oral intake over the last several days and continues to have lower extremity  diaphoresis causing further fluid loss. -Encourage p.o. 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 Liver enzymes remain elevated although improving.  Abdomen is not distended today, though the patient reports continued mild discomfort.  -Trend LFTs -Resume home Lasix and spironolactone once blood pressure is stable   #Microcytic anemia Hgb stable.  She was started on iron supplementation 12/29. -Trend CBC -Continue daily iron supplement   #History of hypertension Patient has been largely hypotensive during this admission. Currently holding home amlodipine and coreg. -Continue to hold come amlodipine and coreg -Encourage PO intake -IVF bolus as needed for pressure support, MAP goal greater than 65  Best Practice: Diet: Cardiac diet IVF: Fluids: IV push only, no IV fluids VTE: enoxaparin (LOVENOX) injection 40 mg Start: 07/24/21 1300 Code: Full AB: Rocephin IV 2 g daily Therapy Recs: SNF, DME:  none DISPO: Anticipated discharge to Skilled nursing facility pending Medical stability.  Signature: Farrel Gordon, D.O.  Internal Medicine Resident, PGY-1 [facility]  Pager: [phone] 8:10 AM, 07/26/2021   Please contact the on call pager after 5 pm and on weekends at [phone].

## 2021-07-26 NOTE — NC FL2 (Signed)
[place] LEVEL OF CARE SCREENING TOOL     IDENTIFICATION  Patient Name: [name] Birthdate: [birth year] Sex: female Admission Date (Current Location): 07/23/2021  [facility] and [place] Number:  Herbalist and Address:  [facility] [place], [place] [address], [place], [place] [address]      Provider Number: [id number]  Attending Physician Name and Address:  Sid Falcon, MD  Relative Name and Phone Number:  [name], [phone]    Current Level of Care: Hospital Recommended Level of Care: [place] Prior Approval Number:    Date Approved/Denied:   PASRR Number: [id number]  Discharge Plan: SNF    Current Diagnoses: Patient Active Problem List   Diagnosis Date Noted   AKI (acute kidney injury) ([place])    Bacteremia due to Klebsiella pneumoniae    UTI (urinary tract infection) 07/23/2021   Dry gangrene ([place]) 04/10/2021   Osteomyelitis ([place]) 04/09/2021   Hiatal hernia with GERD 02/07/2021   Hepatocellular carcinoma ([place]) 02/07/2021   Sepsis secondary to UTI ([place]) 12/08/2020   Spasticity 12/06/2020   Contracture of lower leg joint 12/06/2020   Autonomic dysfunction 08/30/2020   Neurogenic bowel 08/30/2020   Wheelchair dependence 08/30/2020   Paraplegia following spinal cord injury ([place]) 07/07/2018   Medication management 06/07/2018   Cough 06/07/2018   Cirrhosis ([place]) 04/20/2018   Obstipation/fecal impaction 02/12/2018   Intractable vomiting 02/11/2018   Chronic hepatitis C without hepatic coma ([place]) 01/10/2018   NICM (nonischemic cardiomyopathy) ([place]) 08/25/2017   Normal coronary arteries 54/00/8676   Diastolic dysfunction 19/50/9326   S/P thoracentesis    Status post thoracentesis    S/p percutaneous right heart catheterization    Pleural effusion on right 06/29/2017   Respiratory failure with hypoxia ([place]) 06/29/2017   Pressure injury of skin 06/29/2017   Shortness of breath    Hypokalemia  09/21/2016   Fever in adult 07/28/2016   Decubitus ulcer of coccyx 02/04/2016   Hyperglycemia 12/03/2015   H/O bilateral mastectomy 07/25/2015   Benign hypertensive heart disease without heart failure 07/15/2015   Hiatal hernia 07/06/2015   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   History of breast cancer 08/21/2014   Adynamic ileus ([place]) 07/22/2014   Elevated liver enzymes 07/22/2014   CHF (congestive heart failure) ([place]) 07/05/2014   Gastroparesis 07/05/2014   Microcytic anemia 01/05/2014   Insomnia 07/14/2013   Edema 05/01/2013   Rheumatoid arthritis ([place]) 02/23/2013   GERD (gastroesophageal reflux disease) 02/23/2013   Essential hypertension 02/23/2013   Neurogenic bladder 02/23/2013   Paraplegia ([place]) 02/23/2013   Depression 02/23/2013   Allergic rhinitis 02/23/2013   Constipation 02/23/2013   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[place]) 09/24/2011   Breast cancer, left breast ([place]) 09/08/2011    Orientation RESPIRATION BLADDER Height & Weight     Self, Time, Situation, Place  Normal Incontinent, Indwelling catheter Weight: 163 lb 9.3 oz (74.2 kg) Height:  5\' 5"  (165.1 cm)  BEHAVIORAL SYMPTOMS/MOOD NEUROLOGICAL BOWEL NUTRITION STATUS      Incontinent Diet (See DC summary)  AMBULATORY STATUS COMMUNICATION OF NEEDS Skin   Total Care Verbally PU Stage and Appropriate Care (Sacrum, R hip and toe)                       Personal Care Assistance Level of Assistance  Bathing, Feeding, Dressing Bathing Assistance: Maximum assistance Feeding assistance: Independent Dressing Assistance: Maximum assistance     Functional Limitations  Info  Sight, Hearing, Speech Sight Info: Adequate Hearing Info: Adequate Speech Info: Adequate    SPECIAL CARE FACTORS FREQUENCY  PT (By licensed PT), OT (By licensed OT)     PT Frequency: 5x week OT Frequency: 5x week            Contractures Contractures Info: Not present    Additional Factors Info   Code Status, Allergies, Psychotropic Code Status Info: Full Allergies Info: NKA Psychotropic Info: Ativan, Zoloft         Current Medications (07/26/2021):  This is the current hospital active medication list Current Facility-Administered Medications  Medication Dose Route Frequency Provider Last Rate Last Admin   0.9 %  sodium chloride infusion   Intravenous PRN Velna Ochs, MD   Stopped at 07/24/21 2152   acetaminophen (TYLENOL) tablet 650 mg  650 mg Oral Q6H PRN Harvie Heck, MD   650 mg at 07/24/21 0134   Or   acetaminophen (TYLENOL) suppository 650 mg  650 mg Rectal Q6H PRN Harvie Heck, MD       aspirin EC tablet 81 mg  81 mg Oral Daily Aslam, Loralyn Freshwater, MD   81 mg at 07/26/21 0843   atorvastatin (LIPITOR) tablet 40 mg  40 mg Oral Daily Aslam, Loralyn Freshwater, MD   40 mg at 07/26/21 0843   baclofen (LIORESAL) tablet 10 mg  10 mg Oral BID Demaio, Alexa, MD   10 mg at 07/26/21 0842   bisacodyl (DULCOLAX) suppository 10 mg  10 mg Rectal Q T,Th,S,Su-1800 Aslam, Sadia, MD   10 mg at 07/24/21 1834   cefTRIAXone (ROCEPHIN) 2 g in sodium chloride 0.9 % 100 mL IVPB  2 g Intravenous Q24H Pham, Minh Q, RPH-CPP   Stopped at 07/25/21 2040   Chlorhexidine Gluconate Cloth 2 % PADS 6 each  6 each Topical Daily Velna Ochs, MD   6 each at 07/26/21 0845   Chlorhexidine Gluconate Cloth 2 % PADS 6 each  6 each Topical Q0600 Velna Ochs, MD   6 each at 07/26/21 0542   enoxaparin (LOVENOX) injection 40 mg  40 mg Subcutaneous Q24H Pham, Minh Q, RPH-CPP   40 mg at 07/26/21 1142   ferrous gluconate (FERGON) tablet 324 mg  324 mg Oral Daily Farrel Gordon, DO   324 mg at 07/26/21 0844   hydrocortisone 1 % ointment 1 application  1 application Topical J19E PRN Aslam, Loralyn Freshwater, MD       hydroxychloroquine (PLAQUENIL) tablet 200 mg  200 mg Oral Daily Aslam, Sadia, MD   200 mg at 07/26/21 1740   linaclotide (LINZESS) capsule 290 mcg  290 mcg Oral Daily Aslam, Loralyn Freshwater, MD   290 mcg at 07/26/21 0844   LORazepam  (ATIVAN) tablet 0.5 mg  0.5 mg Oral Daily Aslam, Loralyn Freshwater, MD   0.5 mg at 07/26/21 0843   mupirocin ointment (BACTROBAN) 2 % 1 application  1 application Nasal BID Velna Ochs, MD   1 application at 81/44/81 0845   pantoprazole (PROTONIX) EC tablet 40 mg  40 mg Oral BID Pham, Minh Q, RPH-CPP   40 mg at 07/26/21 0843   polyvinyl alcohol (LIQUIFILM TEARS) 1.4 % ophthalmic solution 1 drop  1 drop Both Eyes QID Heloise Purpura, RPH   1 drop at 07/23/21 2256   senna-docusate (Senokot-S) tablet 1 tablet  1 tablet Oral QHS PRN Aslam, Loralyn Freshwater, MD       senna-docusate (Senokot-S) tablet 2 tablet  2 tablet Oral BID Harvie Heck, MD   2 tablet at  07/26/21 0844   sertraline (ZOLOFT) tablet 50 mg  50 mg Oral Daily Aslam, Sadia, MD   50 mg at 07/26/21 0843   sodium chloride flush (NS) 0.9 % injection 3 mL  3 mL Intravenous Q12H Aslam, Sadia, MD   3 mL at 07/26/21 0846   tiZANidine (ZANAFLEX) tablet 4 mg  4 mg Oral TID Harvie Heck, MD   4 mg at 07/26/21 0569     Discharge Medications: Please see discharge summary for a list of discharge medications.  Relevant Imaging Results:  Relevant Lab Results:   Additional Information [id number], 2 covid vaccinations  Coralee Pesa, LCSWA

## 2021-07-26 NOTE — TOC Initial Note (Signed)
Transition of Care [facility]) - Initial/Assessment Note    Patient Details  Name: [name] MRN: [id number] Date of Birth: [birth year]  Transition of Care [name]) CM/SW Contact:    Coralee Pesa, [place] Phone Number: 07/26/2021, 1:51 PM  Clinical Narrative:                 CSW met with pt at bedside today to confirm her DC plans. Pt is from [place] and has been there for LTC for many years. Plan is to return there with PT OT services. TOC will need to initiate insurance auth prior to DC to determine if Medicare will pay for additional services. TOC will continue to follow for DC needs.  Expected Discharge Plan: Skilled Nursing Facility Barriers to Discharge: Continued Medical Work up, Ship broker   Patient Goals and CMS Choice Patient states their goals for this hospitalization and ongoing recovery are:: Pt states her goal is to return to her facility with rehab services. CMS Medicare.gov Compare Post Acute Care list provided to:: Patient Choice offered to / list presented to : Patient  Expected Discharge Plan and Services Expected Discharge Plan: [place] Choice: [place] Living arrangements for the past 2 months: [place]                                      Prior Living Arrangements/Services Living arrangements for the past 2 months: [place] Lives with:: Facility Resident Patient language and need for interpreter reviewed:: Yes Do you feel safe going back to the place where you live?: Yes      Need for Family Participation in Patient Care: Yes (Comment) Care giver support system in place?: No (comment)   Criminal Activity/Legal Involvement Pertinent to Current Situation/Hospitalization: No - Comment as needed  Activities of Daily Living      Permission Sought/Granted Permission sought to share information with : Family Supports Permission granted to share  information with : No              Emotional Assessment Appearance:: Appears stated age Attitude/Demeanor/Rapport: Engaged Affect (typically observed): Appropriate Orientation: : Oriented to Self, Oriented to Place, Oriented to  Time, Oriented to Situation Alcohol / Substance Use: Not Applicable Psych Involvement: No (comment)  Admission diagnosis:  Hyponatremia [E87.1] UTI (urinary tract infection) [N39.0] AKI (acute kidney injury) ([place]) [N17.9] Urinary tract infection with hematuria, site unspecified [N39.0, R31.9] Sepsis, due to unspecified organism, unspecified whether acute organ dysfunction present [facility]) [A41.9] Patient Active Problem List   Diagnosis Date Noted   AKI (acute kidney injury) ([place])    Bacteremia due to Klebsiella pneumoniae    UTI (urinary tract infection) 07/23/2021   Dry gangrene ([place]) 04/10/2021   Osteomyelitis ([place]) 04/09/2021   Hiatal hernia with GERD 02/07/2021   Hepatocellular carcinoma ([place]) 02/07/2021   Sepsis secondary to UTI ([place]) 12/08/2020   Spasticity 12/06/2020   Contracture of lower leg joint 12/06/2020   Autonomic dysfunction 08/30/2020   Neurogenic bowel 08/30/2020   Wheelchair dependence 08/30/2020   Paraplegia following spinal cord injury ([place]) 07/07/2018   Medication management 06/07/2018   Cough 06/07/2018   Cirrhosis ([place]) 04/20/2018   Obstipation/fecal impaction 02/12/2018   Intractable vomiting 02/11/2018   Chronic hepatitis C without hepatic coma ([place]) 01/10/2018   NICM (nonischemic cardiomyopathy) ([place]) 08/25/2017   Normal coronary arteries 08/25/2017  Diastolic dysfunction 24/49/7530   S/P thoracentesis    Status post thoracentesis    S/p percutaneous right heart catheterization    Pleural effusion on right 06/29/2017   Respiratory failure with hypoxia ([place]) 06/29/2017   Pressure injury of skin 06/29/2017   Shortness of breath    Hypokalemia 09/21/2016   Fever in adult 07/28/2016   Decubitus ulcer of coccyx  02/04/2016   Hyperglycemia 12/03/2015   H/O bilateral mastectomy 07/25/2015   Benign hypertensive heart disease without heart failure 07/15/2015   Hiatal hernia 07/06/2015   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   History of breast cancer 08/21/2014   Adynamic ileus ([place]) 07/22/2014   Elevated liver enzymes 07/22/2014   CHF (congestive heart failure) ([place]) 07/05/2014   Gastroparesis 07/05/2014   Microcytic anemia 01/05/2014   Insomnia 07/14/2013   Edema 05/01/2013   Rheumatoid arthritis ([place]) 02/23/2013   GERD (gastroesophageal reflux disease) 02/23/2013   Essential hypertension 02/23/2013   Neurogenic bladder 02/23/2013   Paraplegia ([place]) 02/23/2013   Depression 02/23/2013   Allergic rhinitis 02/23/2013   Constipation 02/23/2013   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 [place] [address] Phone: [phone] Fax: [phone]     Social Determinants of Health (SDOH) Interventions    Readmission Risk Interventions No flowsheet data found.

## 2021-07-27 LAB — COMPREHENSIVE METABOLIC PANEL
ALT: 69 U/L — ABNORMAL HIGH (ref 0–44)
AST: 59 U/L — ABNORMAL HIGH (ref 15–41)
Albumin: 2.5 g/dL — ABNORMAL LOW (ref 3.5–5.0)
Alkaline Phosphatase: 118 U/L (ref 38–126)
Anion gap: 11 (ref 5–15)
BUN: 18 mg/dL (ref 8–23)
CO2: 19 mmol/L — ABNORMAL LOW (ref 22–32)
Calcium: 9.3 mg/dL (ref 8.9–10.3)
Chloride: 107 mmol/L (ref 98–111)
Creatinine, Ser: 0.7 mg/dL (ref 0.44–1.00)
GFR, Estimated: >60 mL/min (ref >60)
Glucose, Bld: 135 mg/dL — ABNORMAL HIGH (ref 70–99)
Potassium: 4.1 mmol/L (ref 3.5–5.1)
Sodium: 137 mmol/L (ref 135–145)
Total Bilirubin: 0.4 mg/dL (ref 0.3–1.2)
Total Protein: 7.5 g/dL (ref 6.5–8.1)

## 2021-07-27 LAB — CBC
HCT: 33.8 % — ABNORMAL LOW (ref 36.0–46.0)
Hemoglobin: 10.6 g/dL — ABNORMAL LOW (ref 12.0–15.0)
MCH: 24.7 pg — ABNORMAL LOW (ref 26.0–34.0)
MCHC: 31.4 g/dL (ref 30.0–36.0)
MCV: 78.6 fL — ABNORMAL LOW (ref 80.0–100.0)
Platelets: 246 10*3/uL (ref 150–400)
RBC: 4.3 MIL/uL (ref 3.87–5.11)
RDW: 18.3 % — ABNORMAL HIGH (ref 11.5–15.5)
WBC: 8 10*3/uL (ref 4.0–10.5)
nRBC: 0 % (ref 0.0–0.2)

## 2021-07-27 NOTE — TOC Progression Note (Signed)
Transition of Care [facility]) - Progression Note    Patient Details  Name: [name] MRN: [id number] Date of Birth: 03/17/1951  Transition of Care [facility]) CM/SW [name], [place] Phone Number: 07/27/2021, 12:53 PM  Clinical Narrative:    CSW was notified by MD that pt is ready to DC when the facility can accept her back. CSW spoke with [facility] DON who noted that due to the weekend and the holiday, they do not have the staffing to do any admissions, however they will be able to accept her back tomorrow. CSW asked about PT OT and they stated they would get authorization and arrange rehab when pt returns. TOC will continue to follow for DC needs.   Expected Discharge Plan: [place] Barriers to Discharge: Continued Medical Work up, Ship broker  Expected Discharge Plan and Services Expected Discharge Plan: [place] Choice: [place] arrangements for the past 2 months: [place]                                       Social Determinants of Health (SDOH) Interventions    Readmission Risk Interventions No flowsheet data found.

## 2021-07-27 NOTE — Progress Notes (Signed)
HD#4 SUBJECTIVE:  Patient Summary: [name] is a 71 y.o. 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    Overnight Events: None  Interim History: Patient is feeling well today and does not have any concern this time.  OBJECTIVE:  Vital Signs: Vitals:   07/26/21 1800 07/26/21 2000 07/27/21 0446 07/27/21 0908  BP: 108/66 100/62 108/68 (!) 142/80  Pulse:  70 81 77  Resp:  17 18 20   Temp:  98.5 F (36.9 C) 98.6 F (37 C) 98 F (36.7 C)  TempSrc:  Oral Oral Oral  SpO2:  99% 99% 99%  Weight:      Height:       Supplemental O2: Room Air SpO2: 99 %  Filed Weights   07/23/21 0700 07/25/21 1900 07/26/21 0425  Weight: 73.5 kg 74.1 kg 74.2 kg     Intake/Output Summary (Last 24 hours) at 07/27/2021 1240 Last data filed at 07/27/2021 0905 Gross per 24 hour  Intake 1660 ml  Output 925 ml  Net 735 ml   Net IO Since Admission: 565.23 mL [07/27/21 1240]  Physical Exam: Constitutional: Pleasant female resting comfortably in bed, no acute distress noted. Cardio: Regular rate and rhythm.  No murmurs, rubs, gallops. Pulm: Clear to auscultation bilaterally.  Normal work of breathing on room air. Abdomen: Soft, nontender, nondistended.  Positive for hepatomegaly. MSK: Negative for extremity edema. Skin: Skin is warm and diaphoretic. Neuro: Alert and oriented x3. Psych: Normal mood and affect.  Patient Lines/Drains/Airways Status     Active Line/Drains/Airways     Name Placement date Placement time Site Days   Peripheral IV 07/23/21 20 G Anterior;Right Forearm 07/23/21  0741  Forearm  4   Peripheral IV 07/23/21 20 G Posterior;Right Forearm 07/23/21  0743  Forearm  4   Negative Pressure Wound Therapy Toe (Comment  which one)  Anterior;Right 04/13/21  0827  --  105   Urethral Catheter Edison Nasuti RN 16 Fr. 04/11/21  1355  --  107   Urethral Catheter Charito,RN Straight-tip 23 Fr. 07/23/21  2120  Straight-tip  4   Incision (Closed) 06/11/16 Back Posterior;Mid 06/11/16  0230  -- 1872   Incision (Closed) 04/13/21 Leg Right 04/13/21  0808  -- 105   Incision - 1 Port Abdomen Lateral;Right;Upper 07/02/21  1052  -- 25   Pressure Injury 06/11/16 Deep Tissue Injury - Purple or maroon localized area of discolored intact skin or blood-filled blister due to damage of underlying soft tissue from pressure and/or shear. small abrasion like in apperance but is non blanchable but 06/11/16  0230  -- 1872   Pressure Injury 06/11/16 Deep Tissue Injury - Purple or maroon localized area of discolored intact skin or blood-filled blister due to damage of underlying soft tissue from pressure and/or shear. small abrasion like pressure injury that is nonblanchable  06/11/16  0230  -- 1872   Pressure Injury 04/10/21 Buttocks Right;Left;Lower Stage 2 -  Partial thickness loss of dermis presenting as a shallow open injury with a red, pink wound bed without slough. 04/10/21  1446  -- 108   Pressure Injury 07/23/21 Sacrum Stage 2 -  Partial thickness loss of dermis presenting as a shallow open injury with a red, pink wound bed without slough. healed pressure injury 07/23/21  2110  --  4   Wound / Incision (Open or Dehisced) 02/14/18 Other (Comment) Toe (Comment  which one) Left 02/14/18  1319  Toe (Comment  which one)  1259   Wound / Incision (Open or Dehisced) 07/02/21  Toe (Comment  which one) Anterior;Right dry skin 07/02/21  1803  Toe (Comment  which one)  25   Wound / Incision (Open or Dehisced) 07/02/21 Non-pressure wound Hip Anterior;Right per pt, started from abrasion 07/02/21  1818  Hip  25   Wound / Incision (Open or Dehisced) 07/02/21 Non-pressure wound started as abrasion 07/02/21  1820  --  25   Wound / Incision (Open or Dehisced) 07/02/21  Other (Comment) Vertebral column old scar 07/02/21  1829  Vertebral column  25   Wound / Incision (Open or Dehisced) 07/02/21 Other (Comment) Sacrum stage 2 07/02/21  1830  Sacrum  25   Wound / Incision (Open or Dehisced) 07/02/21 Perineum stage 2 07/02/21  1831  Perineum  25   Wound / Incision (Open or Dehisced) 07/23/21 Non-pressure wound Hip Anterior;Right 07/23/21  2110  Hip  4             ASSESSMENT/PLAN:  Assessment: Principal Problem:   Bacteremia due to Klebsiella pneumoniae Active Problems:   UTI (urinary tract infection)   AKI (acute kidney injury) ([place])   Plan: [name] is a 71 y.o. 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   Plan: #Sepsis 2/2 bacteremia, source: UTI #History of neurogenic bladder with indwelling foley catheter Currently being treated with IV Rocephin..  Susceptibilities are still pending, however patient seems to be responding appropriately to treatment.  Leukocytosis has resolved.  Urine culture shows no growth to date, and as previously noted blood culture grew Klebsiella pneumonia.  CT abdomen 12/31 was negative for hydronephrosis and showed nonspecific haziness of the left renal sinus fat, as well as possibly reactive retroperitoneal lymph nodes which are stable compared to prior CT. -Continue IV Rocephin with total treatment duration of 7 days.   -Follow blood and urine cultures, as well as susceptibilities   #Acute kidney injury #Non-anion gap metabolic acidosis AKI has resolved.  Metabolic acidosis is unchanged. -Encourage p.o. hydration -Trend renal function -Avoid nephrotoxic agents   #Hypotonic hyponatremia, resolved Hyponatremia is persistent though stable at sodium level within  normal limits at 137 today.  Patient has had decreased oral intake over the last several days and continues to have lower extremity diaphoresis causing further fluid loss. -Encourage p.o. 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 Liver enzymes remain elevated.  CT abdomen 12/31 showed suspected mass in the right liver lobe, liver surface nodularity suspicious for cirrhosis.  Of note, she did recently undergo a CT microwave ablation of suspected hepatocellular carcinoma. -Trend LFTs -Resume home Lasix and spironolactone once blood pressure is stable -IR recommending follow-up as scheduled for suspected hepatocellular carcinoma   #Microcytic anemia Hgb stable.  She was started on iron supplementation 12/29. -Trend CBC -Continue daily iron supplement   #History of hypertension Patient has been largely hypotensive during this admission. Currently holding home amlodipine and coreg. -Continue to hold come amlodipine and coreg.  Consider decreasing these doses or discontinuing at discharge. -Encourage  PO intake -IVF bolus as needed for pressure support, MAP goal greater than 65  Best Practice: Diet: Cardiac diet IVF: Fluids: IV push only, no IV fluids VTE: enoxaparin (LOVENOX) injection 40 mg Start: 07/24/21 1300 Code: Full AB: Rocephin IV 2 g daily Therapy Recs: [facility] DISPO: Anticipated discharge in 1-2 days to  [facility]  pending  acceptance back to facility .  Signature: Farrel Gordon, D.O.  Internal Medicine Resident, PGY-1 [facility]  Pager: [phone] 12:40 PM, 07/27/2021   Please contact the on call pager after 5 pm and on weekends at [phone].

## 2021-07-28 DIAGNOSIS — B961 Klebsiella pneumoniae [K. pneumoniae] as the cause of diseases classified elsewhere: Secondary | ICD-10-CM | POA: Diagnosis not present

## 2021-07-28 DIAGNOSIS — R7881 Bacteremia: Secondary | ICD-10-CM | POA: Diagnosis not present

## 2021-07-28 LAB — CULTURE, BLOOD (ROUTINE X 2)
Special Requests: ADEQUATE
Special Requests: ADEQUATE

## 2021-07-28 LAB — COMPREHENSIVE METABOLIC PANEL
ALT: 67 U/L — ABNORMAL HIGH (ref 0–44)
AST: 40 U/L (ref 15–41)
Albumin: 2.5 g/dL — ABNORMAL LOW (ref 3.5–5.0)
Alkaline Phosphatase: 116 U/L (ref 38–126)
Anion gap: 11 (ref 5–15)
BUN: 13 mg/dL (ref 8–23)
CO2: 22 mmol/L (ref 22–32)
Calcium: 9.3 mg/dL (ref 8.9–10.3)
Chloride: 104 mmol/L (ref 98–111)
Creatinine, Ser: 0.7 mg/dL (ref 0.44–1.00)
GFR, Estimated: >60 mL/min (ref >60)
Glucose, Bld: 112 mg/dL — ABNORMAL HIGH (ref 70–99)
Potassium: 4 mmol/L (ref 3.5–5.1)
Sodium: 137 mmol/L (ref 135–145)
Total Bilirubin: 0.4 mg/dL (ref 0.3–1.2)
Total Protein: 7.8 g/dL (ref 6.5–8.1)

## 2021-07-28 LAB — CBC
HCT: 36.3 % (ref 36.0–46.0)
Hemoglobin: 11.2 g/dL — ABNORMAL LOW (ref 12.0–15.0)
MCH: 24.3 pg — ABNORMAL LOW (ref 26.0–34.0)
MCHC: 30.9 g/dL (ref 30.0–36.0)
MCV: 78.7 fL — ABNORMAL LOW (ref 80.0–100.0)
Platelets: 279 10*3/uL (ref 150–400)
RBC: 4.61 MIL/uL (ref 3.87–5.11)
RDW: 18.1 % — ABNORMAL HIGH (ref 11.5–15.5)
WBC: 8.9 10*3/uL (ref 4.0–10.5)
nRBC: 0 % (ref 0.0–0.2)

## 2021-07-28 MED ORDER — SULFAMETHOXAZOLE-TRIMETHOPRIM 800-160 MG PO TABS
1.0000 | ORAL_TABLET | Freq: Two times a day (BID) | ORAL | Status: DC
  Administered 2021-07-28: 1 via ORAL
  Filled 2021-07-28: qty 1

## 2021-07-28 MED ORDER — ATORVASTATIN CALCIUM 40 MG PO TABS: 40.0000 mg | ORAL_TABLET | Freq: Every day | ORAL | 0 refills | Status: AC

## 2021-07-28 MED ORDER — FERROUS GLUCONATE 324 (38 FE) MG PO TABS: 324.0000 mg | ORAL_TABLET | Freq: Every day | ORAL | 3 refills | Status: DC

## 2021-07-28 MED ORDER — SULFAMETHOXAZOLE-TRIMETHOPRIM 800-160 MG PO TABS: 1.0000 | ORAL_TABLET | Freq: Two times a day (BID) | ORAL | 0 refills | Status: DC

## 2021-07-28 NOTE — Discharge Instructions (Addendum)
You were seen in the hospital for a UTI and found to have an infection from a bug called Klebsiella pneumoniae.  We have started you on Bactrim today, which is an oral form of the medication that can target this blood.  We will continue this for 1 week. We also started you on some iron due to having lower iron levels. Continue taking this medication. We are holding your blood pressure meds currently because you had lower blood pressures recently. You will resume these under the discretion of your primary care doctor.  Please follow-up with your primary care provider in the next few days to week to discuss your recent hospitalization.  If any symptoms change or worsen acutely, please return to the nearest emergency department.

## 2021-07-28 NOTE — Discharge Summary (Signed)
Name: [name] MRN: [id number] DOB: 1950-08-26 71 y.o. PCP: Caprice Renshaw, MD  Date of Admission: 07/23/2021  6:28 AM Date of Discharge: 07/28/2021 Attending Physician: Sid Falcon, MD  Discharge Diagnosis: 1. Sepsis 2/2 bacteremia, UTI 2. AKI, resolved 3.  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  History of hypertension 5.  Microcytic anemia  Discharge Medications: Allergies as of 07/28/2021   No Known Allergies      Medication List     STOP taking these medications    amLODipine 5 MG tablet Commonly known as: NORVASC   carvedilol 12.5 MG tablet Commonly known as: COREG   carvedilol 6.25 MG tablet Commonly known as: COREG   furosemide 40 MG tablet Commonly known as: LASIX   spironolactone 100 MG tablet Commonly known as: ALDACTONE       TAKE these medications    acetaminophen 325 MG tablet Commonly known as: TYLENOL Take 650 mg by mouth every 6 (six) hours as needed for mild pain or fever.   albuterol 108 (90 Base) MCG/ACT inhaler Commonly known as: VENTOLIN HFA Inhale 2 puffs into the lungs every 4 (four) hours as needed for wheezing or shortness of breath.   aspirin EC 81 MG tablet Take 81 mg by mouth daily. Swallow whole.   atorvastatin 40 MG tablet Commonly known as: Lipitor Take 1 tablet (40 mg total) by mouth daily.   Benzocaine 10 MG Lozg Use as directed 1 lozenge in the mouth or throat every 2 (two) hours as needed (sore throat).   benzonatate 100 MG capsule Commonly known as: TESSALON Take 1 capsule (100 mg total) by mouth every 8 (eight) hours.   bisacodyl 10 MG suppository Commonly known as: DULCOLAX Place 1 suppository (10 mg total) rectally every Tuesday, Thursday, Saturday, and Sunday at 6 PM.   calcium-vitamin D 500-200 MG-UNIT tablet Commonly known as: OSCAL WITH D Take 1 tablet by mouth 2 (two) times daily.   Carboxymethylcellulose Sod PF 0.5 % Soln Place 1 drop into both eyes in the morning,  at noon, in the evening, and at bedtime.   cetirizine 10 MG tablet Commonly known as: ZYRTEC Take 10 mg by mouth daily as needed for allergies.   cholecalciferol 1000 units tablet Commonly known as: VITAMIN D Take 1,000 Units by mouth daily.   Cranberry 450 MG Tabs Take 450 mg by mouth daily.   desonide 0.05 % ointment Commonly known as: DESOWEN Apply 1 application topically 2 (two) times daily as needed (lower lip dryness).   docusate sodium 100 MG capsule Commonly known as: COLACE Take 100 mg by mouth 3 (three) times daily as needed for mild constipation.   feeding supplement (PRO-STAT 64) Liqd Take 30 mLs by mouth 2 (two) times daily. For wound healing   ferrous gluconate 324 MG tablet Commonly known as: FERGON Take 1 tablet (324 mg total) by mouth daily.   hydrocortisone 2.5 % lotion Apply 1 application topically every 12 (twelve) hours as needed (itching of arms and legs).   hydroquinone 2 % cream Apply 1 application topically 2 (two) times daily. To dark spots   hydroxychloroquine 200 MG tablet Commonly known as: PLAQUENIL Take 200 mg by mouth daily.   Linzess 290 MCG Caps capsule Generic drug: linaclotide Take 290 mcg by mouth daily.   LORazepam 0.5 MG tablet Commonly known as: ATIVAN Take 0.5 mg by mouth daily.   Lumify 0.025 % Soln Generic drug: Brimonidine Tartrate Place 1 drop into both eyes daily  as needed (redness).   multivitamin with minerals Tabs tablet Take 1 tablet by mouth daily.   omeprazole 20 MG capsule Commonly known as: PRILOSEC Take 20 mg by mouth 2 (two) times daily.   oxybutynin 5 MG 24 hr tablet Commonly known as: DITROPAN-XL Take 5 mg by mouth daily.   pantoprazole 40 MG tablet Commonly known as: PROTONIX Take 1 tablet (40 mg total) by mouth 2 (two) times daily.   polyethylene glycol 17 g packet Commonly known as: MIRALAX / GLYCOLAX Take 17 g by mouth every Monday, Wednesday, Friday, and Saturday at 6 PM. What changed:  when to take this   prenatal multivitamin Tabs tablet Take 1 tablet by mouth daily at 12 noon.   sennosides-docusate sodium 8.6-50 MG tablet Commonly known as: SENOKOT-S Take 2 tablets by mouth 2 (two) times daily. What changed: when to take this   sertraline 50 MG tablet Commonly known as: ZOLOFT Take 50 mg by mouth daily.   SM Tussin DM 10-100 MG/5ML liquid Generic drug: Dextromethorphan-guaiFENesin Take 15 mLs by mouth every 4 (four) hours as needed. What changed: reasons to take this   sulfamethoxazole-trimethoprim 800-160 MG tablet Commonly known as: BACTRIM DS Take 1 tablet by mouth every 12 (twelve) hours.   SYSTANE COMPLETE OP Place 2 drops into both eyes in the morning, at noon, in the evening, and at bedtime.   tiZANidine 4 MG tablet Commonly known as: ZANAFLEX Take 4 mg by mouth 3 (three) times daily.   Zinc Oxide 12.8 % ointment Commonly known as: TRIPLE PASTE Apply 1 application topically as needed for irritation.   zinc sulfate 220 (50 Zn) MG capsule Take 220 mg by mouth daily.        Disposition and follow-up:   [name] was discharged from [facility] in Good condition.  At the hospital follow up visit please address:  1.  Sepsis 2/2 bacteremia, source UTI: Patient received IV vancomycin, cefepime, and Flagyl, and she was transitioned to Rocephin on 12/29.  Patient improved clinically throughout her hospital course.  However, sensitivities showed partial resistance to ceftriaxone and sensitivity to Bactrim. Therefore, on day of discharge, she was transitioned to p.o. Bactrim.  She will continue this for a total of 7 days in order to fully treat her infection.  AKI, transaminitis, Hypotonic hyponatremia: Patient will need follow-up CMP as outpatient to ensure resolution of her AKI, hyponatremia, and transaminitis. Lasix and spiro were held during her hospitalization and will need to be resumed under the discretion of her  PCP.  Microcytic anemia: Patient was started on iron supplementation during her hospitalization.  She will need follow-up CBC in the outpatient setting.  History of HTN: Patient has a history of hypertension for which she is on home amlodipine and Coreg.  These were held during her hospitalization in the setting of septic shock and persistent hypotension.  These medications will need to be resumed under the discretion of her PCP.  2.  Labs / imaging needed at time of follow-up: CMP, CBC  3.  Pending labs/ test needing follow-up: None  Follow-up Appointments:  Contact information for after-discharge care     Destination     [facility] Preferred SNF .   Service: Skilled Nursing Contact information: [place] [facility] Course by problem list: #Sepsis 2/2 bacteremia, source:  UTI #History of neurogenic bladder with indwelling foley catheter Septic presentation with fever T-max 102.5, tachycardia, hypotension, leukocytosis, and source identified through urinalysis which was consistent with UTI.  CT abdomen/pelvis did not show evidence of pyelonephritis, however there was moderate bilateral hydronephrosis with right greater than left.  At this time, her catheter was exchanged.  In the ED she received IV vancomycin, cefepime, and Flagyl.  She was continued on IV cefepime through 12/29, at which point she was transitioned to IV Rocephin.  Blood cultures did result positive for Klebsiella pneumoniae.  The patient did improve clinically on IV Rocephin, however on 1/2, sensitivities showed partial resistance to ceftriaxone but sensitive to Bactrim, therefore the patient was transitioned to Bactrim on day of discharge.  She will continue this for a total of 7 days (on day 1 of 7).   #Acute kidney injury #Non-anion gap metabolic acidosis On admission, patient endorsed decreased oral intake over  the last several days and was noted to have an AKI. She was given fluid resuscitation with gradual improvement and resolution of her AKI. Creatinine was stable at 0.7 on day of discharge.   #Hypotonic hyponatremia Hyponatremia on admission noted to be likely secondary to decreased oral intake.  This was stable throughout her admission.   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  She did not have or develop ascites.  Because of hypotension, home Lasix and spironolactone were held. Repeat imaging to assess for resolution of hydronephrosis was significant for possible enlargement of the previously noted right posterior lobe lesion.  This finding was discussed with IR who felt that it could be related to post ablation changes as patient underwent CT macro ablation on 07/02/2021, and recommended that the patient follow-up with IR on discharge and further management decisions can be made at that time.   #Microcytic anemia Patient initially had hematuria, however never showed signs of overt bleeding.  Her hemoglobin remained stable throughout admission, however iron studies did suggest possible iron deficiency.  She was started on iron supplementation.   #History of hypertension In the setting of septic shock and persistent hypotension, home amlodipine and Coreg were held.  Patient had some softer pressures towards the end of her discharge, last BP being 122/76.  Her home medications continue to be held at discharge, plan to resume these as outpatient.  Discharge Exam:   BP 122/76 (BP Location: Right Arm)    Pulse 78    Temp (!) 97.5 F (36.4 C) (Oral)    Resp 20    Ht 5\' 5"  (1.651 m)    Wt 74.2 kg    SpO2 100%    BMI 27.22 kg/m  Discharge exam:  Constitutional: Pleasant female resting comfortably in bed, no acute distress noted. Cardio: Regular rate and rhythm.  No murmurs, rubs, gallops. Pulm: Clear to auscultation bilaterally.  Normal work  of breathing on room air. Abdomen: Soft, nontender, nondistended. MSK: Negative for extremity edema. Skin: Skin is warm and dry. Neuro: Alert and oriented x3. No focal deficits appreciated. Psych: Normal mood and affect.  Pertinent Labs, Studies, and Procedures:  CT ABDOMEN PELVIS WO CONTRAST  Result Date: 07/23/2021 CLINICAL DATA:  UTI. Recurrent/complicated. Paraplegia. Chronic Foley catheter. EXAM: CT ABDOMEN AND PELVIS WITHOUT CONTRAST TECHNIQUE: Multidetector CT imaging of the abdomen and pelvis was performed following the standard protocol without IV contrast. COMPARISON:  CT abdomen and pelvis 12/08/2020. FINDINGS: Lower chest: Chronic airspace opacities or scarring are present at the right  base. Left lung is clear. Coronary artery calcifications are present. Heart size is normal. No significant pleural or pericardial effusion is present. Hepatobiliary: Hypoattenuation in the posterior right lobe of the liver is compatible with recent percutaneous ablation. No new lesions are present. Patient is status post cholecystectomy. 2 Pancreas: Unremarkable. No pancreatic ductal dilatation or surrounding inflammatory changes. Spleen: Peripherally calcified lesion in the spleen stable. Adrenals/Urinary Tract: Moderate right-sided scratched at moderate bilateral hydronephrosis is new. Stranding noted about the left kidney. The tip of the Foley catheter extends to the orifice of the left ureter beyond the balloon. The urinary bladder is otherwise collapsed. No distal obstructing lesions are present. Stomach/Bowel: Moderate-sized hiatal hernia is again noted. The stomach and duodenum are otherwise within normal limits. Small bowel is unremarkable. Terminal ileum is within normal limits. The ascending and transverse colon are normal. Descending and sigmoid colon are unremarkable. Vascular/Lymphatic: Atherosclerotic calcifications are again noted in the aorta branch vessels without aneurysm. No significant  adenopathy is present. Reproductive: Calcified uterine fibroids are stable. Adnexa unremarkable. Other: No abdominal wall hernia or abnormality. No abdominopelvic ascites. Musculoskeletal: Ankylosis of the lower lumbar spine again noted. Chronic bilateral hip fractures present with extensive callus formation. No acute osseous lesions. Chronic decubitus ulceration stable. IMPRESSION: 1. New moderate bilateral hydronephrosis, right greater than left. 2. The tip of the Foley catheter extends to the orifice of the left ureter beyond the balloon. 3. Chronic airspace disease or scarring at the right base. 4. Coronary artery disease. 5. Moderate-sized hiatal hernia. 6. Calcified uterine fibroids. 7. Chronic bilateral hip fractures with extensive callus formation. 8. Ankylosis of the lower lumbar spine. 9. Aortic Atherosclerosis (ICD10-I70.0). Electronically Signed   By: San Morelle M.D.   On: 07/23/2021 17:05   DG Chest Port 1 View  Result Date: 07/23/2021 CLINICAL DATA:  71 year old female with sepsis. EXAM: PORTABLE CHEST 1 VIEW COMPARISON:  Chest x-ray April 09, 2021. FINDINGS: Lung volumes are low. Chronic elevation of the right hemidiaphragm again noted. No consolidative airspace disease. No pleural effusions. No pneumothorax. No pulmonary nodule or mass noted. Pulmonary vasculature and the cardiomediastinal silhouette are within normal limits. IMPRESSION: 1. Low lung volumes without radiographic evidence of acute cardiopulmonary disease. Electronically Signed   By: Vinnie Langton M.D.   On: 07/23/2021 07:01      CBC    Component Value Date/Time   WBC 8.9 07/28/2021 0519   RBC 4.61 07/28/2021 0519   HGB 11.2 (L) 07/28/2021 0519   HCT 36.3 07/28/2021 0519   PLT 279 07/28/2021 0519   MCV 78.7 (L) 07/28/2021 0519   MCH 24.3 (L) 07/28/2021 0519   MCHC 30.9 07/28/2021 0519   RDW 18.1 (H) 07/28/2021 0519   LYMPHSABS 0.8 07/23/2021 0733   MONOABS 1.5 (H) 07/23/2021 0733   EOSABS 0.0  07/23/2021 0733   BASOSABS 0.0 07/23/2021 0733    CMP Latest Ref Rng & Units 07/28/2021 07/27/2021 07/26/2021  Glucose 70 - 99 mg/dL 112(H) 135(H) 113(H)  BUN 8 - 23 mg/dL 13 18 31(H)  Creatinine 0.44 - 1.00 mg/dL 0.70 0.70 0.82  Sodium 135 - 145 mmol/L 137 137 132(L)  Potassium 3.5 - 5.1 mmol/L 4.0 4.1 3.3(L)  Chloride 98 - 111 mmol/L 104 107 106  CO2 22 - 32 mmol/L 22 19(L) 19(L)  Calcium 8.9 - 10.3 mg/dL 9.3 9.3 8.5(L)  Total Protein 6.5 - 8.1 g/dL 7.8 7.5 7.3  Total Bilirubin 0.3 - 1.2 mg/dL 0.4 0.4 0.3  Alkaline Phos 38 - 126 U/L  116 118 109  AST 15 - 41 U/L 40 59(H) 41  ALT 0 - 44 U/L 67(H) 69(H) 49(H)   Blood cultures: + Klebsiella pneumonia  AMPICILLIN >=32 RESIST... Resistant    AMPICILLIN/SULBACTAM 8 SENSITIVE  Sensitive    CEFAZOLIN >=64 RESIST... Resistant    CEFEPIME 0.5 SENSITIVE  Sensitive    CEFTAZIDIME RESISTANT  Resistant    CEFTRIAXONE 8 RESISTANT  Resistant    CIPROFLOXACIN <=0.25 SENS... Sensitive    GENTAMICIN 8 INTERMEDI... Intermediate    IMIPENEM <=0.25 SENS... Sensitive    PIP/TAZO <=4 SENSITIVE  Sensitive    TRIMETH/SULFA <=20 SENSIT... Sensitive    Urinalysis    Component Value Date/Time   COLORURINE AMBER (A) 07/23/2021 0857   APPEARANCEUR TURBID (A) 07/23/2021 0857   LABSPEC 1.016 07/23/2021 0857   PHURINE 5.0 07/23/2021 0857   GLUCOSEU NEGATIVE 07/23/2021 0857   HGBUR LARGE (A) 07/23/2021 0857   BILIRUBINUR NEGATIVE 07/23/2021 [name] 07/23/2021 0857   PROTEINUR 100 (A) 07/23/2021 0857   UROBILINOGEN 1.0 09/30/2010 1854   NITRITE NEGATIVE 07/23/2021 0857   LEUKOCYTESUR LARGE (A) 07/23/2021 0857    Signed: Orvis Brill, MD 07/28/2021, 11:00 AM   Pager: [phone]

## 2021-07-28 NOTE — TOC Transition Note (Signed)
Transition of Care [facility]) - CM/SW Discharge Note   Patient Details  Name: LAVERLE PILLARD MRN: [id number] Date of Birth: 1951/02/21  Transition of Care [facility]) CM/SW Contact:  Coralee Pesa, [place] Phone Number: 07/28/2021, 10:02 AM   Clinical Narrative:    Pt to be transported to [place] via [place]. Nurse to call report to [phone].   Final next level of care: Skilled Nursing Facility Barriers to Discharge: Barriers Resolved   Patient Goals and CMS Choice Patient states their goals for this hospitalization and ongoing recovery are:: Pt states her goal is to return to her facility with rehab services. CMS Medicare.gov Compare Post Acute Care list provided to:: Patient Choice offered to / list presented to : Patient  Discharge Placement              Patient chooses bed at:  ([facility]) Patient to be transferred to facility by: [place] Name of family member notified: Patient Patient and family notified of of transfer: 07/28/21  Discharge Plan and Services     Post Acute Care Choice: [place]                               Social Determinants of Health (SDOH) Interventions     Readmission Risk Interventions No flowsheet data found.

## 2021-07-28 NOTE — Plan of Care (Signed)
Problem: Education: Goal: Knowledge of General Education information will improve Description: Including pain rating scale, medication(s)/side effects and non-pharmacologic comfort measures Outcome: Completed/Met   Problem: Health Behavior/Discharge Planning: Goal: Ability to manage health-related needs will improve Outcome: Completed/Met   Problem: Clinical Measurements: Goal: Ability to maintain clinical measurements within normal limits will improve Outcome: Completed/Met Goal: Will remain free from infection Outcome: Completed/Met Goal: Diagnostic test results will improve Outcome: Completed/Met Goal: Respiratory complications will improve Outcome: Completed/Met Goal: Cardiovascular complication will be avoided Outcome: Completed/Met   Problem: Activity: Goal: Risk for activity intolerance will decrease Outcome: Completed/Met   Problem: Nutrition: Goal: Adequate nutrition will be maintained Outcome: Completed/Met   Problem: Coping: Goal: Level of anxiety will decrease Outcome: Completed/Met   Problem: Elimination: Goal: Will not experience complications related to bowel motility Outcome: Completed/Met Goal: Will not experience complications related to urinary retention Outcome: Completed/Met   Problem: Pain Managment: Goal: General experience of comfort will improve Outcome: Completed/Met   Problem: Safety: Goal: Ability to remain free from injury will improve Outcome: Completed/Met   Problem: Skin Integrity: Goal: Risk for impaired skin integrity will decrease Outcome: Completed/Met   Problem: Urinary Elimination: Goal: Signs and symptoms of infection will decrease Outcome: Completed/Met   

## 2021-07-28 NOTE — Progress Notes (Signed)
DISCHARGE NOTE HOME [name] to be discharged Skilled nursing facility per MD order. Discussed prescriptions and follow up appointments with the patient. Prescriptions given to patient; medication list explained in detail. Patient verbalized understanding.  Skin clean, dry and intact without evidence of skin break down, no evidence of skin tears noted. IV catheter discontinued intact. Site without signs and symptoms of complications. Dressing and pressure applied. Pt denies pain at the site currently. No complaints noted.  An After Visit Summary (AVS) was printed and given to the patient. Patient escorted via stretcher, and discharged home via [place].  Vira Agar, RN

## 2021-07-29 ENCOUNTER — Telehealth: Payer: Self-pay

## 2021-07-29 NOTE — Telephone Encounter (Signed)
Patient called and stated she is having to get her catheter out everyday and was in the hospital for 6 days with a UTI. She is now wanting to get the suprapubic catheter you discussed with her at her last visit. She is scheduled to come see you on 08/15/21. She also stated she is interested in getting more botox.

## 2021-07-30 NOTE — Telephone Encounter (Signed)
She needs to get Suprapubic from urology- I think it's already set up from a note I received. Will have to discuss Botox at f/u- tell her thanks for getting in touch- will call if she needs more info- thanks- ML

## 2021-07-31 ENCOUNTER — Other Ambulatory Visit: Payer: Self-pay | Admitting: Interventional Radiology

## 2021-07-31 ENCOUNTER — Encounter: Payer: Self-pay | Admitting: *Deleted

## 2021-07-31 ENCOUNTER — Other Ambulatory Visit (HOSPITAL_COMMUNITY): Payer: Self-pay | Admitting: Interventional Radiology

## 2021-07-31 ENCOUNTER — Ambulatory Visit
Admission: RE | Admit: 2021-07-31 | Discharge: 2021-07-31 | Disposition: A | Discharge status: Home / Self Care | Payer: Medicare Other | Source: Ambulatory Visit | Attending: Student | Admitting: Student

## 2021-07-31 ENCOUNTER — Other Ambulatory Visit: Payer: Self-pay

## 2021-07-31 DIAGNOSIS — R160 Hepatomegaly, not elsewhere classified: Secondary | ICD-10-CM

## 2021-07-31 DIAGNOSIS — N319 Neuromuscular dysfunction of bladder, unspecified: Secondary | ICD-10-CM

## 2021-07-31 HISTORY — PX: IR RADIOLOGIST EVAL & MGMT: IMG5224

## 2021-07-31 NOTE — Telephone Encounter (Signed)
Spoke with patient and she stated she is scheduled with her urologist and she is wanting to do the procedure with the bag and the patient wants the valve procedure. She states she wants to discuss it further at her appointment on 08/15/21

## 2021-07-31 NOTE — Consult Note (Signed)
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 the patient was extubated. Patient tolerated the procedure well and remained hemodynamically stable throughout. No complications were encountered and no significant blood loss. FINDINGS: Sequential CT imaging during the case demonstrates treatment of hypodense lesion corresponding to the LR 5 lesion identified on prior MR imaging. Satisfactory position of the microwave antenna at the inferior and then more cephalad aspect of the lesion confirmed on sequential CT imaging during the treatment. Adequate coverage of the lesion is inferred given the ablation zone at the completion of the procedure. No complicating features on the final. IMPRESSION: Status post image guided tissue ablation with microwave technology of right-sided  segment 6 liver lesion, presumably [place], with intraoperative biopsy performed. Signed, Dulcy Fanny. Earleen Newport,  DO, RPVI [facility] Radiology Electronically Signed   By: Corrie Mckusick D.O.   On: 07/02/2021 12:12   CT BIOPSY  Result Date: 07/02/2021 INDICATION: 71 year old female with a history of LR 5 right liver lesion, compatible with [place] presents for ablation EXAM: IMAGE GUIDED TISSUE ABLATION RIGHT LIVER LESION WITH ULTRASOUND AND CT GUIDANCE IMAGE GUIDED BIOPSY OF RIGHT LIVER LESION COMPARISON:  CT 12/08/2020, MR abdomen 06/21/2020 MEDICATIONS: Zosyn IV; The antibiotic was administered in an appropriate time interval prior to needle puncture of the skin. ANESTHESIA/SEDATION: General - as administered by the Anesthesia department The radiology nurse assisted in monitoring the patients level of consciousness and vital signs continuously throughout the procedure under my direct supervision. FLUOROSCOPY TIME:  CT COMPLICATIONS: None TECHNIQUE: Informed written consent was obtained from the patient after a thorough discussion of the procedural risks, benefits and alternatives. All questions were addressed. Maximal Sterile Barrier Technique was utilized including caps, mask, sterile gowns, sterile gloves, sterile drape, hand hygiene and skin antiseptic. A timeout was performed prior to the initiation of the procedure. Once the patient was transported to the [place] room for treatment, the patient has identified D was identified as correct. The anesthesia team was present for induction of general anesthesia. The patient was then position on the CT table and scout images were acquired. Ultrasound images were acquired for planning purposes with images stored and sent to PACs. The patient was then prepped and draped in the usual sterile fashion. Using ultrasound imaging a fiducial needle was advanced through intercostal approach targeting the lesion in segment 6. CT was performed  confirming the needle position. 20 cm biopsy needle was then advanced with a combination of ultrasound imaging and tandem trajectory of the first fiducial needle, targeting the lesion. CT was performed confirming location. We then placed a 15 gauge 20 cm Neuwave PR microwave antenna using a tangential approach to the biopsy needle. CT was performed confirming location. We then did percutaneous biopsy with 3 separate 18 gauge core biopsy performed through the biopsy needle. The biopsy needle was removed and the fiducial needle was removed. We then used sequential CT imaging to modify the position of the microwave antenna through the inferior and central aspect of the lesion confirming with CT imaging. Once we were satisfied with the location, treatment was performed with a 10 minutes 65 what ablation time treatment. At 5 minutes through the ablation a CT was performed. At the completion of the 10 minute treatment time CT was again performed confirming no unexpected findings. We then repositioned the PR probe towards the more cephalad and medial aspect of the lesion with some sequential maneuvers and CT guidance. Once we were satisfied with the probe position we initiated a second treatment, 10 minutes, 65 watts. After 5 minutes of treatment time a CT was performed demonstrating no unexpected findings. After the second treatment time the microwave antenna was removed and a final CT was performed. We terminated the procedure in the patient was extubated. Patient tolerated the procedure well and remained hemodynamically stable throughout. No complications were encountered and no significant blood loss. FINDINGS: Sequential CT imaging during the case demonstrates treatment of hypodense lesion corresponding to the LR 5 lesion identified on prior MR imaging. Satisfactory position of the microwave antenna at the inferior and then more cephalad aspect of the lesion confirmed on sequential CT imaging during the treatment.  Adequate coverage of the lesion is inferred given the ablation zone at the completion of the  procedure. No complicating features on the final. IMPRESSION: Status post image guided tissue ablation with microwave technology of right-sided segment 6 liver lesion, presumably [place], with intraoperative biopsy performed. Signed, Dulcy Fanny. Dellia Nims, RPVI [facility] Radiology Electronically Signed   By: Corrie Mckusick D.O.   On: 07/02/2021 15:53   DG Chest Port 1 View  Result Date: 07/23/2021 CLINICAL DATA:  71 year old female with sepsis. EXAM: PORTABLE CHEST 1 VIEW COMPARISON:  Chest x-ray April 09, 2021. FINDINGS: Lung volumes are low. Chronic elevation of the right hemidiaphragm again noted. No consolidative airspace disease. No pleural effusions. No pneumothorax. No pulmonary nodule or mass noted. Pulmonary vasculature and the cardiomediastinal silhouette are within normal limits. IMPRESSION: 1. Low lung volumes without radiographic evidence of acute cardiopulmonary disease. Electronically Signed   By: Vinnie Langton M.D.   On: 07/23/2021 07:01    Labs:  CBC: Recent Labs    07/25/21 0155 07/26/21 0441 07/27/21 0306 07/28/21 0519  WBC 11.2* 8.8 8.0 8.9  HGB 10.0* 10.1* 10.6* 11.2*  HCT 32.2* 31.7* 33.8* 36.3  PLT 219 211 246 279    COAGS: Recent Labs    04/10/21 0427 04/13/21 0130 07/02/21 0750 07/23/21 0733 07/24/21 0102  INR 1.2 1.1 1.1 1.2 1.3*  APTT 33 32 30 30  --     BMP: Recent Labs    07/25/21 0155 07/26/21 0441 07/27/21 0306 07/28/21 0519  NA 132* 132* 137 137  K 4.0 3.3* 4.1 4.0  CL 104 106 107 104  CO2 20* 19* 19* 22  GLUCOSE 124* 113* 135* 112*  BUN 44* 31* 18 13  CALCIUM 9.1 8.5* 9.3 9.3  CREATININE 1.14* 0.82 0.70 0.70  GFRNONAA 52* >60 >60 >60    LIVER FUNCTION TESTS: Recent Labs    07/25/21 0155 07/26/21 0441 07/27/21 0306 07/28/21 0519  BILITOT 0.4 0.3 0.4 0.4  AST 40 41 59* 40  ALT 54* 49*  69* 67*  ALKPHOS 104 109 118 116  PROT 7.2 7.3 7.5 7.8  ALBUMIN 2.5* 2.4* 2.5* 2.5*    TUMOR MARKERS: No results for input(s): AFPTM, CEA, CA199, CHROMGRNA in the last 8760 hours.  Assessment and Plan:   [name] Witting is a 71 year old female SP treatment of right liver [place] pathology proven diagnosis on recent biopsy.   She is also having problems with her foley catheter which is prescribed for her neurogenic bladder.    Regarding her recent treatment, she has recovered well, and we will plan on her first repeat imaging about 3 months after the treatment, in March.   Regarding her foley catheter symptoms, Urology has recommended considering a supra-pubic catheter.    Today I discussed with her the logistics of performing image guided supra-pubic  bladder drainage.  Her main concern was that she is familiar with a means of controlling the outflow "with a valve" such that she can be in control of emptying her bladder on most occasion to avoid the need of constant leg bag.  I did assure her that any drain we provide can be modified with a valve.    Regarding risks of drainage, these include bleeding, infection, damage to adjacent structures, including bowel perforation/fistula connection, and sepsis, cardiopulmonary collapse, death.   After our discussion, she does wish to proceed with suprapubic catheter.    I think it is possible to consider placement of a 3F drain on the first occasion, as she remains insensate in her pelvis.  Plan: - Regarding the CT guided tissue ablation, we will see her back in March - April with repeat imaging, contrast enhanced CT abdomen with liver protocol.  Pelvis will not be needed.   - Regarding suprapubic drain, we will get her set up for CT guided supra-public cathter placement with Dr. Earleen Newport at [facility] or [facility], and consider placement of 63F catheter on the first occasion as above.         Electronically Signed: Corrie Mckusick 07/31/2021, 11:01 AM   I  spent a total of    40 Minutes in remote  clinical consultation, greater than 50% of which was counseling/coordinating care for neurogenic bladder, possible supra-pubic catheter drainage.    Visit type: Audio only (telephone). Audio (no video) only due to patient's lack of internet/smartphone capability. Alternative for in-person consultation at [facility], [place] [address], [place], [place]. This visit type was conducted due to national recommendations for restrictions regarding the COVID-19 Pandemic (e.g. social distancing).  This format is felt to be most appropriate for this patient at this time.  All issues noted in this document were discussed and addressed.

## 2021-08-04 ENCOUNTER — Encounter (HOSPITAL_COMMUNITY): Payer: Self-pay

## 2021-08-04 ENCOUNTER — Encounter (HOSPITAL_BASED_OUTPATIENT_CLINIC_OR_DEPARTMENT_OTHER): Payer: Medicare Other | Attending: Internal Medicine | Admitting: Internal Medicine

## 2021-08-04 ENCOUNTER — Other Ambulatory Visit: Payer: Self-pay

## 2021-08-04 DIAGNOSIS — L89313 Pressure ulcer of right buttock, stage 3: Secondary | ICD-10-CM | POA: Diagnosis present

## 2021-08-04 DIAGNOSIS — B182 Chronic viral hepatitis C: Secondary | ICD-10-CM | POA: Diagnosis not present

## 2021-08-04 DIAGNOSIS — Z89431 Acquired absence of right foot: Secondary | ICD-10-CM | POA: Insufficient documentation

## 2021-08-04 DIAGNOSIS — G8220 Paraplegia, unspecified: Secondary | ICD-10-CM | POA: Insufficient documentation

## 2021-08-04 DIAGNOSIS — I110 Hypertensive heart disease with heart failure: Secondary | ICD-10-CM | POA: Diagnosis not present

## 2021-08-04 DIAGNOSIS — Z853 Personal history of malignant neoplasm of breast: Secondary | ICD-10-CM | POA: Diagnosis not present

## 2021-08-04 DIAGNOSIS — L89152 Pressure ulcer of sacral region, stage 2: Secondary | ICD-10-CM | POA: Diagnosis not present

## 2021-08-04 DIAGNOSIS — S30811A Abrasion of abdominal wall, initial encounter: Secondary | ICD-10-CM | POA: Diagnosis not present

## 2021-08-04 DIAGNOSIS — X58XXXA Exposure to other specified factors, initial encounter: Secondary | ICD-10-CM | POA: Insufficient documentation

## 2021-08-04 DIAGNOSIS — I509 Heart failure, unspecified: Secondary | ICD-10-CM | POA: Insufficient documentation

## 2021-08-04 DIAGNOSIS — M069 Rheumatoid arthritis, unspecified: Secondary | ICD-10-CM | POA: Diagnosis not present

## 2021-08-04 NOTE — Progress Notes (Signed)
Steward Ros, April Mellody Dance      Previous Messages   ----- Message -----  From: Marcelyn Bruins  Sent: 07/31/2021  12:41 PM EST  To: Harlene Salts  Subject: Schedule Suprapubic Drain-Ct Guided Pearlie Oyster*   Interventional Radiology Procedure Request   Name: Dhruvi Crenshaw 11/18/1950   Procedure: Ct Guided Supra-guided 80F Catheter Placement   Reason for procedure: Neurogenic Bladder   Relevant History: See note   Order Physician: Dr Earleen Newport to place catheter   Office Contact: Me  [facility] no precert req per online  Thank you
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
## 2021-08-04 NOTE — Progress Notes (Signed)
CARRIN, VANNOSTRAND ([id number]) Visit Report for 08/04/2021 Arrival Information Details Patient Name: Date of Service: [name], [name] 08/04/2021 2:15 PM Medical Record Number: [id number] Patient Account Number: [id number] Date of Birth/Sex: Treating RN: 02/15/1951 (71 y.o. [name], [name] Primary Care [name]: [name] Other Clinician: Referring [name]: Treating [name]/Extender: [name] in Treatment: 11 Visit Information History Since Last Visit Added or deleted any medications: No Patient Arrived: Wheel Chair Any new allergies or adverse reactions: No Arrival Time: 14:07 Had a fall or experienced change in No Accompanied By: self activities of daily living that may affect Transfer Assistance: None risk of falls: Patient Requires Transmission-Based Precautions: No Signs or symptoms of abuse/neglect since last visito No Patient Has Alerts: Yes Hospitalized since last visit: No Patient Alerts: Patient on Blood Thinner Implantable device outside of the clinic excluding No cellular tissue based products placed in the center since last visit: Has Dressing in Place as Prescribed: Yes Pain Present Now: No Electronic Signature(s) Signed: 08/04/2021 4:36:05 PM By: [name] Entered By: [name] on 08/04/2021 14:10:23 -------------------------------------------------------------------------------- Clinic Level of Care Assessment Details Patient Name: Date of Service: [name], [name] 08/04/2021 2:15 PM Medical Record Number: [id number] Patient Account Number: [id number] Date of Birth/Sex: Treating RN: June 19, 1951 (71 y.o. [name] Primary Care [name]: [name] Other Clinician: Referring [name]: Treating [name]/Extender: [name] in Treatment: 11 Clinic Level of Care Assessment Items TOOL 4 Quantity Score X- 1 0 Use when only an EandM is performed on FOLLOW-UP visit ASSESSMENTS -  Nursing Assessment / Reassessment X- 1 10 Reassessment of Co-morbidities (includes updates in patient status) X- 1 5 Reassessment of Adherence to Treatment Plan ASSESSMENTS - Wound and Skin A ssessment / Reassessment []  - 0 Simple Wound Assessment / Reassessment - one wound X- 2 5 Complex Wound Assessment / Reassessment - multiple wounds X- 1 10 Dermatologic / Skin Assessment (not related to wound area) ASSESSMENTS - Focused Assessment []  - 0 Circumferential Edema Measurements - multi extremities []  - 0 Nutritional Assessment / Counseling / Intervention []  - 0 Lower Extremity Assessment (monofilament, tuning fork, pulses) []  - 0 Peripheral Arterial Disease Assessment (using hand held doppler) ASSESSMENTS - Ostomy and/or Continence Assessment and Care []  - 0 Incontinence Assessment and Management []  - 0 Ostomy Care Assessment and Management (repouching, etc.) PROCESS - Coordination of Care []  - 0 Simple Patient / Family Education for ongoing care X- 1 20 Complex (extensive) Patient / Family Education for ongoing care X- 1 10 Staff obtains Programmer, systems, Records, T Results / Process Orders est X- 1 10 Staff telephones HHA, Nursing Homes / Clarify orders / etc []  - 0 Routine Transfer to another Facility (non-emergent condition) []  - 0 Routine Hospital Admission (non-emergent condition) []  - 0 New Admissions / Biomedical engineer / Ordering NPWT Apligraf, etc. , []  - 0 Emergency Hospital Admission (emergent condition) []  - 0 Simple Discharge Coordination X- 1 15 Complex (extensive) Discharge Coordination PROCESS - Special Needs []  - 0 Pediatric / Minor Patient Management []  - 0 Isolation Patient Management []  - 0 Hearing / Language / Visual special needs []  - 0 Assessment of Community assistance (transportation, D/C planning, etc.) []  - 0 Additional assistance / Altered mentation []  - 0 Support Surface(s) Assessment (bed, cushion, seat, etc.) INTERVENTIONS -  Wound Cleansing / Measurement []  - 0 Simple Wound Cleansing - one wound X- 2 5 Complex Wound Cleansing - multiple wounds X-  1 5 Wound Imaging (photographs - any number of wounds) []  - 0 Wound Tracing (instead of photographs) []  - 0 Simple Wound Measurement - one wound X- 2 5 Complex Wound Measurement - multiple wounds INTERVENTIONS - Wound Dressings []  - 0 Small Wound Dressing one or multiple wounds []  - 0 Medium Wound Dressing one or multiple wounds []  - 0 Large Wound Dressing one or multiple wounds X- 1 5 Application of Medications - topical []  - 0 Application of Medications - injection INTERVENTIONS - Miscellaneous []  - 0 External ear exam []  - 0 Specimen Collection (cultures, biopsies, blood, body fluids, etc.) []  - 0 Specimen(s) / Culture(s) sent or taken to Lab for analysis X- 1 10 Patient Transfer (multiple staff / Civil Service fast streamer / Similar devices) []  - 0 Simple Staple / Suture removal (25 or less) []  - 0 Complex Staple / Suture removal (26 or more) []  - 0 Hypo / Hyperglycemic Management (close monitor of Blood Glucose) []  - 0 Ankle / Brachial Index (ABI) - do not check if billed separately X- 1 5 Vital Signs Has the patient been seen at the hospital within the last three years: Yes Total Score: 135 Level Of Care: New/Established - Level 4 Electronic Signature(s) Signed: 08/04/2021 3:33:18 PM By: [name] Catholic RN Entered By: [name] on 08/04/2021 15:02:33 -------------------------------------------------------------------------------- Encounter Discharge Information Details Patient Name: Date of Service: [name], [name] 08/04/2021 2:15 PM Medical Record Number: [id number] Patient Account Number: [id number] Date of Birth/Sex: Treating RN: 12-27-1950 (71 y.o. [name] Primary Care [name]: [name] Other Clinician: Referring Lashawnta Burgert: Treating [name]/Extender: [name] in Treatment: 11 Encounter  Discharge Information Items Discharge Condition: Stable Ambulatory Status: Wheelchair Discharge Destination: [place] Telephoned: No Orders Sent: Yes Transportation: Private Auto Accompanied By: self Schedule Follow-up Appointment: No Clinical Summary of Care: Patient Declined Electronic Signature(s) Signed: 08/04/2021 3:33:18 PM By: [name] Catholic RN Entered By: [name] on 08/04/2021 14:53:55 -------------------------------------------------------------------------------- Lower Extremity Assessment Details Patient Name: Date of Service: [place], [name] 08/04/2021 2:15 PM Medical Record Number: [id number] Patient Account Number: [id number] Date of Birth/Sex: Treating RN: 12/03/1950 (71 y.o. [name] Primary Care Adal Sereno: [name] Other Clinician: Referring Kayliee Atienza: Treating Ryu Cerreta/Extender: [name] in Treatment: 11 Electronic Signature(s) Signed: 08/04/2021 4:36:05 PM By: [name] Entered By: [name] on 08/04/2021 14:11:03 -------------------------------------------------------------------------------- Multi Wound Chart Details Patient Name: Date of Service: [place], [name] 08/04/2021 2:15 PM Medical Record Number: [id number] Patient Account Number: [id number] Date of Birth/Sex: Treating RN: 22-May-1951 (71 y.o. F) Primary Care Mirjana Tarleton: [name] Other Clinician: Referring Ruston Fedora: Treating Aubrey Blackard/Extender: [name] in Treatment: 11 Vital Signs Height(in): Pulse(bpm): 86 Weight(lbs): Blood Pressure(mmHg): 111/83 Body Mass Index(BMI): Temperature(F): 98.2 Respiratory Rate(breaths/min): 16 Photos: [N/A:N/A] Sacrum Right Abdomen - Lower Quadrant N/A Wound Location: Pressure Injury Blister N/A Wounding Event: Pressure Ulcer Abrasion N/A Primary Etiology: Anemia, Chronic Obstructive Anemia, Chronic Obstructive N/A Comorbid History: Pulmonary  Disease (COPD), Pulmonary Disease (COPD), Congestive Heart Failure, Coronary Congestive Heart Failure, Coronary Artery Disease, Hypertension, Artery Disease, Hypertension, Rheumatoid Arthritis, Paraplegia, Rheumatoid Arthritis, Paraplegia, Received Chemotherapy Received Chemotherapy 05/27/2021 05/27/2021 N/A Date Acquired: 9 7 N/A Weeks of Treatment: Healed - Epithelialized Healed - Epithelialized N/A Wound Status: 0x0x0 0x0x0 N/A Measurements L x W x D (cm) 0 0 N/A A (cm) : rea 0 0 N/A Volume (cm) : 100.00% 100.00% N/A % Reduction in Area: 100.00% 100.00%  N/A % Reduction in Volume: Category/Stage II Full Thickness Without Exposed N/A Classification: Support Structures None Present None Present N/A Exudate Amount: Distinct, outline attached Flat and Intact N/A Wound Margin: None Present (0%) None Present (0%) N/A Granulation Amount: None Present (0%) None Present (0%) N/A Necrotic Amount: Fascia: No Fascia: No N/A Exposed Structures: Fat Layer (Subcutaneous Tissue): No Fat Layer (Subcutaneous Tissue): No Tendon: No Tendon: No Muscle: No Muscle: No Joint: No Joint: No Bone: No Bone: No Large (67-100%) Large (67-100%) N/A Epithelialization: Treatment Notes Electronic Signature(s) Signed: 08/04/2021 2:56:04 PM By: Kalman Shan DO Entered By: Kalman Shan on 08/04/2021 14:48:42 -------------------------------------------------------------------------------- Multi-Disciplinary Care Plan Details Patient Name: Date of Service: [facility] [address], [name] 08/04/2021 2:15 PM Medical Record Number: [id number] Patient Account Number: [id number] Date of Birth/Sex: Treating RN: 1951-04-23 (71 y.o. [name] Primary Care Sonu Kruckenberg: [name] Other Clinician: Referring Sierrah Luevano: Treating Leeyah Heather/Extender: [name] in Treatment: 11 Active Inactive Electronic Signature(s) Signed: 08/04/2021 3:33:18 PM By: [name] Catholic RN Entered  By: [name] on 08/04/2021 14:38:59 -------------------------------------------------------------------------------- Pain Assessment Details Patient Name: Date of Service: [place], [name] 08/04/2021 2:15 PM Medical Record Number: [id number] Patient Account Number: [id number] Date of Birth/Sex: Treating RN: 1950-10-04 (71 y.o. [name] Primary Care Helyn Schwan: [name] Other Clinician: Referring Colie Josten: Treating Belva Koziel/Extender: [name] in Treatment: 11 Active Problems Location of Pain Severity and Description of Pain Patient Has Paino No Site Locations Rate the pain. Current Pain Level: 0 Pain Management and Medication Current Pain Management: Medication: No Cold Application: No Rest: No Massage: No Activity: No T.E.N.S.: No Heat Application: No Leg drop or elevation: No Is the Current Pain Management Adequate: Inadequate How does your wound impact your activities of daily livingo Sleep: No Bathing: No Appetite: No Relationship With Others: No Bladder Continence: No Emotions: No Bowel Continence: No Work: No Toileting: No Drive: No Dressing: No Hobbies: No Engineer, maintenance) Signed: 08/04/2021 4:36:05 PM By: [name] Entered By: [name] on 08/04/2021 14:10:56 -------------------------------------------------------------------------------- Patient/Caregiver Education Details Patient Name: Date of Service: [name], [name] 1/9/2023andnbsp2:15 PM Medical Record Number: [id number] Patient Account Number: [id number] Date of Birth/Gender: Treating RN: [birth year] (71 y.o. [name] Primary Care Physician: [name] Other Clinician: Referring Physician: Treating Physician/Extender: [name] in Treatment: 11 Education Assessment Education Provided To: Patient Education Topics Provided Pressure: Handouts: Preventing Pressure Ulcers Methods:  Explain/Verbal Responses: Return demonstration correctly Wound/Skin Impairment: Handouts: Skin Care Do's and Dont's Methods: Explain/Verbal Responses: Return demonstration correctly Electronic Signature(s) Signed: 08/04/2021 3:33:18 PM By: [name] Catholic RN Entered By: [name] on 08/04/2021 14:27:58 -------------------------------------------------------------------------------- Wound Assessment Details Patient Name: Date of Service: [name], [name] 08/04/2021 2:15 PM Medical Record Number: [id number] Patient Account Number: [id number] Date of Birth/Sex: Treating RN: Feb 01, 1951 (71 y.o. [name] Primary Care Nastassja Witkop: [name] Other Clinician: Referring Bryn Saline: Treating Troy Kanouse/Extender: [name] in Treatment: 11 Wound Status Wound Number: 2 Primary Pressure Ulcer Etiology: Wound Location: Sacrum Wound Healed - Epithelialized Wounding Event: Pressure Injury Status: Date Acquired: 05/27/2021 Comorbid Anemia, Chronic Obstructive Pulmonary Disease (COPD), Weeks Of Treatment: 9 History: Congestive Heart Failure, Coronary Artery Disease, Hypertension, Clustered Wound: No Rheumatoid Arthritis, Paraplegia, Received Chemotherapy Photos Wound Measurements Length: (cm) Width: (cm) Depth: (cm) Area: (cm) Volume: (cm) 0 % Reduction in Area: 100% 0 % Reduction in Volume: 100% 0 Epithelialization: Large (67-100%) 0 Tunneling: No  0 Undermining: No Wound Description Classification: Category/Stage II Wound Margin: Distinct, outline attached Exudate Amount: None Present Foul Odor After Cleansing: No Slough/Fibrino No Wound Bed Granulation Amount: None Present (0%) Exposed Structure Necrotic Amount: None Present (0%) Fascia Exposed: No Fat Layer (Subcutaneous Tissue) Exposed: No Tendon Exposed: No Muscle Exposed: No Joint Exposed: No Bone Exposed: No Electronic Signature(s) Signed: 08/04/2021 3:33:18 PM By: [name] Catholic  RN Entered By: [name] on 08/04/2021 14:32:57 -------------------------------------------------------------------------------- Wound Assessment Details Patient Name: Date of Service: [place], [name] 08/04/2021 2:15 PM Medical Record Number: [id number] Patient Account Number: [id number] Date of Birth/Sex: Treating RN: Aug 06, 1950 (71 y.o. [name] Primary Care Laelynn Blizzard: [name] Other Clinician: Referring Kallista Pae: Treating Karley Pho/Extender: [name] in Treatment: 11 Wound Status Wound Number: 3 Primary Abrasion Etiology: Wound Location: Right Abdomen - Lower Quadrant Wound Healed - Epithelialized Wounding Event: Blister Status: Date Acquired: 05/27/2021 Comorbid Anemia, Chronic Obstructive Pulmonary Disease (COPD), Weeks Of Treatment: 7 History: Congestive Heart Failure, Coronary Artery Disease, Hypertension, Clustered Wound: No Rheumatoid Arthritis, Paraplegia, Received Chemotherapy Photos Wound Measurements Length: (cm) Width: (cm) Depth: (cm) Area: (cm) Volume: (cm) 0 % Reduction in Area: 100% 0 % Reduction in Volume: 100% 0 Epithelialization: Large (67-100%) 0 Tunneling: No 0 Undermining: No Wound Description Classification: Full Thickness Without Exposed Support Structures Wound Margin: Flat and Intact Exudate Amount: None Present Foul Odor After Cleansing: No Slough/Fibrino No Wound Bed Granulation Amount: None Present (0%) Exposed Structure Necrotic Amount: None Present (0%) Fascia Exposed: No Fat Layer (Subcutaneous Tissue) Exposed: No Tendon Exposed: No Muscle Exposed: No Joint Exposed: No Bone Exposed: No Electronic Signature(s) Signed: 08/04/2021 3:33:18 PM By: [name] Catholic RN Entered By: [name] on 08/04/2021 14:31:42 -------------------------------------------------------------------------------- Vitals Details Patient Name: Date of Service: CRA [place], [name] 08/04/2021 2:15  PM Medical Record Number: [id number] Patient Account Number: [id number] Date of Birth/Sex: Treating RN: Dec 21, 1950 (71 y.o. [name] Primary Care Corianne Buccellato: [name] Other Clinician: Referring Coda Mathey: Treating Alixandrea Milleson/Extender: [name] in Treatment: 11 Vital Signs Time Taken: 14:10 Temperature (F): 98.2 Pulse (bpm): 85 Respiratory Rate (breaths/min): 16 Blood Pressure (mmHg): 111/83 Reference Range: 80 - 120 mg / dl Electronic Signature(s) Signed: 08/04/2021 4:36:05 PM By: [name] Entered By: [name] on 08/04/2021 14:11:48

## 2021-08-07 ENCOUNTER — Other Ambulatory Visit: Payer: Self-pay | Admitting: Radiology

## 2021-08-08 ENCOUNTER — Other Ambulatory Visit: Payer: Self-pay | Admitting: Internal Medicine

## 2021-08-11 ENCOUNTER — Ambulatory Visit (HOSPITAL_COMMUNITY): Admission: RE | Admit: 2021-08-11 | Payer: Medicaid Other | Source: Ambulatory Visit

## 2021-08-15 ENCOUNTER — Encounter: Payer: Self-pay | Admitting: Physical Medicine and Rehabilitation

## 2021-08-15 ENCOUNTER — Encounter
Payer: Medicare Other | Attending: Physical Medicine and Rehabilitation | Admitting: Physical Medicine and Rehabilitation

## 2021-08-15 ENCOUNTER — Other Ambulatory Visit: Payer: Self-pay

## 2021-08-15 VITALS — BP 124/82 | HR 84 | Temp 98.5°F | Ht 65.0 in | Wt 161.5 lb

## 2021-08-15 DIAGNOSIS — Z993 Dependence on wheelchair: Secondary | ICD-10-CM | POA: Insufficient documentation

## 2021-08-15 DIAGNOSIS — G8220 Paraplegia, unspecified: Secondary | ICD-10-CM | POA: Diagnosis present

## 2021-08-15 DIAGNOSIS — N319 Neuromuscular dysfunction of bladder, unspecified: Secondary | ICD-10-CM | POA: Diagnosis present

## 2021-08-15 DIAGNOSIS — R252 Cramp and spasm: Secondary | ICD-10-CM | POA: Insufficient documentation
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
## 2021-09-01 ENCOUNTER — Other Ambulatory Visit: Payer: Self-pay | Admitting: Radiology

## 2021-09-02 ENCOUNTER — Encounter (HOSPITAL_COMMUNITY): Payer: Self-pay

## 2021-09-02 ENCOUNTER — Ambulatory Visit (HOSPITAL_COMMUNITY)
Admission: RE | Admit: 2021-09-02 | Discharge: 2021-09-02 | Disposition: A | Discharge status: Home / Self Care | Payer: Medicare Other | Source: Ambulatory Visit | Attending: Interventional Radiology | Admitting: Interventional Radiology

## 2021-09-02 ENCOUNTER — Other Ambulatory Visit: Payer: Self-pay

## 2021-09-02 ENCOUNTER — Other Ambulatory Visit (HOSPITAL_COMMUNITY): Payer: Self-pay | Admitting: Interventional Radiology

## 2021-09-02 DIAGNOSIS — N319 Neuromuscular dysfunction of bladder, unspecified: Secondary | ICD-10-CM | POA: Diagnosis not present

## 2021-09-02 LAB — CBC
HCT: 39.8 % (ref 36.0–46.0)
Hemoglobin: 12.2 g/dL (ref 12.0–15.0)
MCH: 23.9 pg — ABNORMAL LOW (ref 26.0–34.0)
MCHC: 30.7 g/dL (ref 30.0–36.0)
MCV: 78 fL — ABNORMAL LOW (ref 80.0–100.0)
Platelets: 339 10*3/uL (ref 150–400)
RBC: 5.1 MIL/uL (ref 3.87–5.11)
RDW: 17 % — ABNORMAL HIGH (ref 11.5–15.5)
WBC: 7.5 10*3/uL (ref 4.0–10.5)
nRBC: 0 % (ref 0.0–0.2)

## 2021-09-02 LAB — PROTIME-INR
INR: 1.1 (ref 0.8–1.2)
Prothrombin Time: 14.3 seconds (ref 11.4–15.2)

## 2021-09-02 MED ORDER — LIDOCAINE HCL 1 % IJ SOLN: 10.0000 mL | Freq: Once | INTRAMUSCULAR | Status: AC

## 2021-09-02 MED ORDER — CEFAZOLIN SODIUM-DEXTROSE 2-4 GM/100ML-% IV SOLN
INTRAVENOUS | Status: AC
  Administered 2021-09-02: 2 g via INTRAVENOUS
  Filled 2021-09-02: qty 100

## 2021-09-02 MED ORDER — LIDOCAINE HCL 1 % IJ SOLN
INTRAMUSCULAR | Status: AC
  Administered 2021-09-02: 10 mL via INTRADERMAL
  Filled 2021-09-02: qty 10

## 2021-09-02 MED ORDER — CEFAZOLIN SODIUM-DEXTROSE 2-4 GM/100ML-% IV SOLN
2.0000 g | Freq: Once | INTRAVENOUS | Status: AC
  Filled 2021-09-02: qty 100

## 2021-09-02 MED ORDER — FENTANYL CITRATE (PF) 100 MCG/2ML IJ SOLN
INTRAMUSCULAR | Status: AC | PRN
  Administered 2021-09-02: 25 ug via INTRAVENOUS

## 2021-09-02 MED ORDER — SODIUM CHLORIDE 0.9% FLUSH: 5.0000 mL | Freq: Three times a day (TID) | INTRAVENOUS | Status: DC

## 2021-09-02 MED ORDER — MIDAZOLAM HCL 2 MG/2ML IJ SOLN
INTRAMUSCULAR | Status: AC | PRN
Start: 2021-09-02 — End: 2021-09-02
  Administered 2021-09-02: .5 mg via INTRAVENOUS

## 2021-09-02 MED ORDER — FENTANYL CITRATE (PF) 100 MCG/2ML IJ SOLN
INTRAMUSCULAR | Status: AC
  Filled 2021-09-02: qty 2

## 2021-09-02 MED ORDER — MIDAZOLAM HCL 2 MG/2ML IJ SOLN
INTRAMUSCULAR | Status: AC
  Filled 2021-09-02: qty 2

## 2021-09-02 MED ORDER — SODIUM CHLORIDE 0.9 % IV SOLN: INTRAVENOUS | Status: DC

## 2021-09-02 NOTE — Procedures (Signed)
Vascular and Interventional Radiology Procedure Note  Patient: [name] DOB: 1950/10/16 Medical Record Number: [id number] Note Date/Time: 09/02/21 10:07 AM   Performing Physician: Michaelle Birks, MD Assistant(s): None  Diagnosis: Incontinence. Paraplegia.  Procedure:  SUPRAPUBIC DRAINAGE CATHETER PLACEMENT  Anesthesia: Conscious Sedation Complications: None Estimated Blood Loss: Minimal Specimens: Sent for None  Findings:  Successful CT-guided placement of 14 F pigtail suprapubic catheter into urinary bladder.  Plan:  - Flush drain with 5 mL Normal Saline every 8 hours. - Follow up for upsize to 16 F Council tip catheter in 2 week(s).  See detailed procedure note with images in PACS. The patient tolerated the procedure well without incident or complication and was returned to Recovery in stable condition.    Michaelle Birks, MD [facility] Radiology   Pager. [place]
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
## 2021-09-02 NOTE — Sedation Documentation (Signed)
Foley catheter removed after placement of suprapubic catheter.

## 2021-09-11 ENCOUNTER — Inpatient Hospital Stay (HOSPITAL_COMMUNITY)
Admission: EM | Admit: 2021-09-11 | Discharge: 2021-09-16 | DRG: 699 | Disposition: A | Discharge status: Skilled Nursing Facility | Payer: Medicare Other | Source: Skilled Nursing Facility | Attending: Internal Medicine | Admitting: Internal Medicine

## 2021-09-11 ENCOUNTER — Emergency Department (HOSPITAL_COMMUNITY): Payer: Medicare Other

## 2021-09-11 ENCOUNTER — Encounter (HOSPITAL_COMMUNITY): Payer: Self-pay

## 2021-09-11 DIAGNOSIS — Z9359 Other cystostomy status: Secondary | ICD-10-CM

## 2021-09-11 DIAGNOSIS — N390 Urinary tract infection, site not specified: Secondary | ICD-10-CM | POA: Diagnosis not present

## 2021-09-11 DIAGNOSIS — N342 Other urethritis: Secondary | ICD-10-CM

## 2021-09-11 DIAGNOSIS — Z823 Family history of stroke: Secondary | ICD-10-CM

## 2021-09-11 DIAGNOSIS — N10 Acute tubulo-interstitial nephritis: Secondary | ICD-10-CM

## 2021-09-11 DIAGNOSIS — K3184 Gastroparesis: Secondary | ICD-10-CM | POA: Diagnosis present

## 2021-09-11 DIAGNOSIS — D638 Anemia in other chronic diseases classified elsewhere: Secondary | ICD-10-CM | POA: Diagnosis present

## 2021-09-11 DIAGNOSIS — E785 Hyperlipidemia, unspecified: Secondary | ICD-10-CM | POA: Diagnosis present

## 2021-09-11 DIAGNOSIS — Z8616 Personal history of COVID-19: Secondary | ICD-10-CM

## 2021-09-11 DIAGNOSIS — M069 Rheumatoid arthritis, unspecified: Secondary | ICD-10-CM | POA: Diagnosis present

## 2021-09-11 DIAGNOSIS — I110 Hypertensive heart disease with heart failure: Secondary | ICD-10-CM | POA: Diagnosis present

## 2021-09-11 DIAGNOSIS — F32A Depression, unspecified: Secondary | ICD-10-CM | POA: Diagnosis present

## 2021-09-11 DIAGNOSIS — L0390 Cellulitis, unspecified: Secondary | ICD-10-CM

## 2021-09-11 DIAGNOSIS — Z8505 Personal history of malignant neoplasm of liver: Secondary | ICD-10-CM

## 2021-09-11 DIAGNOSIS — I10 Essential (primary) hypertension: Secondary | ICD-10-CM | POA: Diagnosis present

## 2021-09-11 DIAGNOSIS — T83510A Infection and inflammatory reaction due to cystostomy catheter, initial encounter: Secondary | ICD-10-CM | POA: Diagnosis not present

## 2021-09-11 DIAGNOSIS — T83030A Leakage of cystostomy catheter, initial encounter: Secondary | ICD-10-CM | POA: Diagnosis present

## 2021-09-11 DIAGNOSIS — K592 Neurogenic bowel, not elsewhere classified: Secondary | ICD-10-CM | POA: Diagnosis present

## 2021-09-11 DIAGNOSIS — Y738 Miscellaneous gastroenterology and urology devices associated with adverse incidents, not elsewhere classified: Secondary | ICD-10-CM | POA: Diagnosis present

## 2021-09-11 DIAGNOSIS — T83010A Breakdown (mechanical) of cystostomy catheter, initial encounter: Secondary | ICD-10-CM

## 2021-09-11 DIAGNOSIS — Z853 Personal history of malignant neoplasm of breast: Secondary | ICD-10-CM

## 2021-09-11 DIAGNOSIS — Z20822 Contact with and (suspected) exposure to covid-19: Secondary | ICD-10-CM | POA: Diagnosis present

## 2021-09-11 DIAGNOSIS — B1920 Unspecified viral hepatitis C without hepatic coma: Secondary | ICD-10-CM | POA: Diagnosis present

## 2021-09-11 DIAGNOSIS — M359 Systemic involvement of connective tissue, unspecified: Secondary | ICD-10-CM | POA: Diagnosis present

## 2021-09-11 DIAGNOSIS — S31000A Unspecified open wound of lower back and pelvis without penetration into retroperitoneum, initial encounter: Secondary | ICD-10-CM | POA: Diagnosis present

## 2021-09-11 DIAGNOSIS — Z800 Family history of malignant neoplasm of digestive organs: Secondary | ICD-10-CM

## 2021-09-11 DIAGNOSIS — G8220 Paraplegia, unspecified: Secondary | ICD-10-CM | POA: Diagnosis present

## 2021-09-11 DIAGNOSIS — I2510 Atherosclerotic heart disease of native coronary artery without angina pectoris: Secondary | ICD-10-CM | POA: Diagnosis present

## 2021-09-11 DIAGNOSIS — I5042 Chronic combined systolic (congestive) and diastolic (congestive) heart failure: Secondary | ICD-10-CM | POA: Diagnosis present

## 2021-09-11 DIAGNOSIS — K7460 Unspecified cirrhosis of liver: Secondary | ICD-10-CM | POA: Diagnosis present

## 2021-09-11 DIAGNOSIS — L03311 Cellulitis of abdominal wall: Secondary | ICD-10-CM | POA: Diagnosis present

## 2021-09-11 DIAGNOSIS — Z8249 Family history of ischemic heart disease and other diseases of the circulatory system: Secondary | ICD-10-CM

## 2021-09-11 DIAGNOSIS — L89159 Pressure ulcer of sacral region, unspecified stage: Secondary | ICD-10-CM | POA: Diagnosis present

## 2021-09-11 DIAGNOSIS — Z8719 Personal history of other diseases of the digestive system: Secondary | ICD-10-CM

## 2021-09-11 DIAGNOSIS — I509 Heart failure, unspecified: Secondary | ICD-10-CM

## 2021-09-11 DIAGNOSIS — R339 Retention of urine, unspecified: Secondary | ICD-10-CM

## 2021-09-11 DIAGNOSIS — Z89421 Acquired absence of other right toe(s): Secondary | ICD-10-CM

## 2021-09-11 LAB — CBC WITH DIFFERENTIAL/PLATELET
Abs Immature Granulocytes: 0.03 10*3/uL (ref 0.00–0.07)
Basophils Absolute: 0 10*3/uL (ref 0.0–0.1)
Basophils Relative: 1 %
Eosinophils Absolute: 0.3 10*3/uL (ref 0.0–0.5)
Eosinophils Relative: 4 %
HCT: 37.2 % (ref 36.0–46.0)
Hemoglobin: 11.3 g/dL — ABNORMAL LOW (ref 12.0–15.0)
Immature Granulocytes: 0 %
Lymphocytes Relative: 23 %
Lymphs Abs: 1.8 10*3/uL (ref 0.7–4.0)
MCH: 24.2 pg — ABNORMAL LOW (ref 26.0–34.0)
MCHC: 30.4 g/dL (ref 30.0–36.0)
MCV: 79.8 fL — ABNORMAL LOW (ref 80.0–100.0)
Monocytes Absolute: 0.6 10*3/uL (ref 0.1–1.0)
Monocytes Relative: 7 %
Neutro Abs: 5.3 10*3/uL (ref 1.7–7.7)
Neutrophils Relative %: 65 %
Platelets: 307 10*3/uL (ref 150–400)
RBC: 4.66 MIL/uL (ref 3.87–5.11)
RDW: 17.1 % — ABNORMAL HIGH (ref 11.5–15.5)
WBC: 8 10*3/uL (ref 4.0–10.5)
nRBC: 0 % (ref 0.0–0.2)

## 2021-09-11 LAB — URINALYSIS, ROUTINE W REFLEX MICROSCOPIC
Bilirubin Urine: NEGATIVE
Glucose, UA: NEGATIVE mg/dL
Ketones, ur: NEGATIVE mg/dL
Nitrite: POSITIVE — AB
Protein, ur: 100 mg/dL — AB
RBC / HPF: >50 RBC/hpf — ABNORMAL HIGH (ref 0–5)
Specific Gravity, Urine: 1.018 (ref 1.005–1.030)
WBC, UA: >50 WBC/hpf — ABNORMAL HIGH (ref 0–5)
pH: 5 (ref 5.0–8.0)

## 2021-09-11 LAB — COMPREHENSIVE METABOLIC PANEL
ALT: 33 U/L (ref 0–44)
AST: 25 U/L (ref 15–41)
Albumin: 3 g/dL — ABNORMAL LOW (ref 3.5–5.0)
Alkaline Phosphatase: 93 U/L (ref 38–126)
Anion gap: 10 (ref 5–15)
BUN: 14 mg/dL (ref 8–23)
CO2: 25 mmol/L (ref 22–32)
Calcium: 9.6 mg/dL (ref 8.9–10.3)
Chloride: 102 mmol/L (ref 98–111)
Creatinine, Ser: 0.59 mg/dL (ref 0.44–1.00)
GFR, Estimated: >60 mL/min (ref >60)
Glucose, Bld: 82 mg/dL (ref 70–99)
Potassium: 4 mmol/L (ref 3.5–5.1)
Sodium: 137 mmol/L (ref 135–145)
Total Bilirubin: 0.4 mg/dL (ref 0.3–1.2)
Total Protein: 8.4 g/dL — ABNORMAL HIGH (ref 6.5–8.1)

## 2021-09-11 LAB — RESP PANEL BY RT-PCR (FLU A&B, COVID) ARPGX2
Influenza A by PCR: NEGATIVE
Influenza B by PCR: NEGATIVE
SARS Coronavirus 2 by RT PCR: NEGATIVE

## 2021-09-11 MED ORDER — HEPARIN SODIUM (PORCINE) 5000 UNIT/ML IJ SOLN
5000.0000 [IU] | Freq: Three times a day (TID) | INTRAMUSCULAR | Status: DC
  Administered 2021-09-11 – 2021-09-16 (×13): 5000 [IU] via SUBCUTANEOUS
  Filled 2021-09-11 (×15): qty 1

## 2021-09-11 MED ORDER — LORAZEPAM 0.5 MG PO TABS
0.5000 mg | ORAL_TABLET | Freq: Every day | ORAL | Status: DC
  Administered 2021-09-12 – 2021-09-16 (×5): 0.5 mg via ORAL
  Filled 2021-09-11 (×5): qty 1

## 2021-09-11 MED ORDER — VANCOMYCIN HCL 1500 MG/300ML IV SOLN
1500.0000 mg | INTRAVENOUS | Status: DC
  Administered 2021-09-12: 1500 mg via INTRAVENOUS
  Filled 2021-09-11: qty 300

## 2021-09-11 MED ORDER — IOHEXOL 300 MG/ML  SOLN
100.0000 mL | Freq: Once | INTRAMUSCULAR | Status: AC | PRN
  Administered 2021-09-11: 100 mL via INTRAVENOUS

## 2021-09-11 MED ORDER — ACETAMINOPHEN 650 MG RE SUPP: 650.0000 mg | Freq: Four times a day (QID) | RECTAL | Status: DC | PRN

## 2021-09-11 MED ORDER — DOCUSATE SODIUM 100 MG PO CAPS: 100.0000 mg | ORAL_CAPSULE | Freq: Three times a day (TID) | ORAL | Status: DC | PRN

## 2021-09-11 MED ORDER — ACETAMINOPHEN 325 MG PO TABS
650.0000 mg | ORAL_TABLET | Freq: Four times a day (QID) | ORAL | Status: DC | PRN
  Administered 2021-09-12 – 2021-09-14 (×4): 650 mg via ORAL
  Filled 2021-09-11 (×4): qty 2

## 2021-09-11 MED ORDER — LINACLOTIDE 145 MCG PO CAPS
290.0000 ug | ORAL_CAPSULE | Freq: Every day | ORAL | Status: DC
  Administered 2021-09-12 – 2021-09-16 (×5): 290 ug via ORAL
  Filled 2021-09-11 (×5): qty 2

## 2021-09-11 MED ORDER — FERROUS GLUCONATE 324 (38 FE) MG PO TABS
324.0000 mg | ORAL_TABLET | Freq: Every day | ORAL | Status: DC
  Administered 2021-09-12 – 2021-09-16 (×5): 324 mg via ORAL
  Filled 2021-09-11 (×6): qty 1

## 2021-09-11 MED ORDER — CARVEDILOL 3.125 MG PO TABS: 12.5000 mg | ORAL_TABLET | Freq: Two times a day (BID) | ORAL | Status: DC

## 2021-09-11 MED ORDER — SODIUM CHLORIDE 0.9 % IV SOLN
2.0000 g | Freq: Two times a day (BID) | INTRAVENOUS | Status: DC
  Administered 2021-09-11 – 2021-09-16 (×10): 2 g via INTRAVENOUS
  Filled 2021-09-11 (×11): qty 2

## 2021-09-11 MED ORDER — PANTOPRAZOLE SODIUM 40 MG PO TBEC
40.0000 mg | DELAYED_RELEASE_TABLET | Freq: Every day | ORAL | Status: DC
  Administered 2021-09-12 – 2021-09-16 (×5): 40 mg via ORAL
  Filled 2021-09-11 (×5): qty 1

## 2021-09-11 MED ORDER — VITAMIN D 25 MCG (1000 UNIT) PO TABS
1000.0000 [IU] | ORAL_TABLET | Freq: Every day | ORAL | Status: DC
  Administered 2021-09-12 – 2021-09-16 (×5): 1000 [IU] via ORAL
  Filled 2021-09-11 (×5): qty 1

## 2021-09-11 MED ORDER — BACLOFEN 10 MG PO TABS
10.0000 mg | ORAL_TABLET | Freq: Three times a day (TID) | ORAL | Status: DC
  Administered 2021-09-11 – 2021-09-16 (×15): 10 mg via ORAL
  Filled 2021-09-11 (×17): qty 1

## 2021-09-11 MED ORDER — PROSOURCE PLUS PO LIQD
30.0000 mL | Freq: Two times a day (BID) | ORAL | Status: DC
  Administered 2021-09-13: 30 mL via ORAL
  Filled 2021-09-11 (×9): qty 30

## 2021-09-11 MED ORDER — VANCOMYCIN HCL 1500 MG/300ML IV SOLN
1500.0000 mg | Freq: Once | INTRAVENOUS | Status: AC
  Administered 2021-09-11: 1500 mg via INTRAVENOUS
  Filled 2021-09-11: qty 300

## 2021-09-11 MED ORDER — SERTRALINE HCL 50 MG PO TABS
50.0000 mg | ORAL_TABLET | Freq: Every day | ORAL | Status: DC
  Administered 2021-09-12 – 2021-09-16 (×5): 50 mg via ORAL
  Filled 2021-09-11 (×5): qty 1

## 2021-09-11 MED ORDER — PRO-STAT 64 PO LIQD: 30.0000 mL | Freq: Two times a day (BID) | ORAL | Status: DC

## 2021-09-11 MED ORDER — ATORVASTATIN CALCIUM 40 MG PO TABS
40.0000 mg | ORAL_TABLET | Freq: Every day | ORAL | Status: DC
  Administered 2021-09-12 – 2021-09-16 (×5): 40 mg via ORAL
  Filled 2021-09-11 (×5): qty 1

## 2021-09-11 MED ORDER — HYDROXYCHLOROQUINE SULFATE 200 MG PO TABS
200.0000 mg | ORAL_TABLET | Freq: Every day | ORAL | Status: DC
  Administered 2021-09-12 – 2021-09-16 (×5): 200 mg via ORAL
  Filled 2021-09-11 (×6): qty 1

## 2021-09-11 MED ORDER — ASPIRIN EC 81 MG PO TBEC
81.0000 mg | DELAYED_RELEASE_TABLET | Freq: Every day | ORAL | Status: DC
  Administered 2021-09-12 – 2021-09-16 (×5): 81 mg via ORAL
  Filled 2021-09-11 (×5): qty 1

## 2021-09-11 MED ORDER — ADULT MULTIVITAMIN W/MINERALS CH
1.0000 | ORAL_TABLET | Freq: Every day | ORAL | Status: DC
  Administered 2021-09-12 – 2021-09-16 (×5): 1 via ORAL
  Filled 2021-09-11 (×5): qty 1

## 2021-09-11 MED ORDER — BISACODYL 10 MG RE SUPP
10.0000 mg | RECTAL | Status: DC
  Administered 2021-09-13 – 2021-09-14 (×2): 10 mg via RECTAL
  Filled 2021-09-11 (×2): qty 1

## 2021-09-11 MED ORDER — OXYBUTYNIN CHLORIDE ER 5 MG PO TB24
5.0000 mg | ORAL_TABLET | Freq: Every day | ORAL | Status: DC
  Administered 2021-09-12 – 2021-09-16 (×5): 5 mg via ORAL
  Filled 2021-09-11 (×6): qty 1

## 2021-09-11 NOTE — ED Notes (Signed)
Got patient on the monitor got patient vitals patient is resting with call bel in reach

## 2021-09-11 NOTE — ED Provider Notes (Addendum)
I have personally seen and examined the patient. I have reviewed the documentation on PMH/FH/Soc Hx. I have discussed the plan of care with the resident and patient.  I have reviewed and agree with the resident's documentation. Please see associated encounter note.  Briefly, the patient is a 71 y.o. female here with pain around her suprapubic catheter.  History of paraplegia and neurogenic bladder.  Had a suprapubic catheter placed recently with interventional radiology.  Noticed today leakage around her catheter with purulent drainage and redness.  She was started on doxycycline for the redness and drainage yesterday around her catheter site.  She is due for exchange next week.  Vital signs are normal.  Clinically she appears well.  There is redness and some drainage around her suprapubic catheter site.  She is tender in this area.  Foley catheter appears to be draining however.  Touch base with Dr. Dwaine Gale with IR team and will evaluate patient.  We will get CT scan abdomen and pelvis, basic labs to evaluate for abscess versus dislodgment of catheter.  Review and interpretation of the labs show no significant anemia, electrolyte abnormality, kidney injury.  Patient pending CT scan abdomen and pelvis.   Per my review of radiology's CT scan read there appears to be a cellulitis but no abscess around her suprapubic catheter.  Suprapubic catheter appears to be located well.  Urinalysis per my review and interpretation shows infectious process.  Overall we will start her on broad-spectrum IV antibiotics for catheter associated UTI as well as surrounding cellulitis.  I have admitted her to medicine.  They can consult radiology team in the morning to talk about possible exchange or what to do further about her catheter.  Catheter appeared to be draining and functioning well while in the room.  Patient has normal vitals.  No leukocytosis.  No concern for sepsis.  This chart was dictated using voice recognition  software.  Despite best efforts to proofread,  errors can occur which can change the documentation meaning.    EKG Interpretation None          Lennice Sites, DO 09/11/21 1825    Lennice Sites, DO 09/11/21 2111    Lennice Sites, DO 09/11/21 2112
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
## 2021-09-11 NOTE — ED Triage Notes (Signed)
Pt presents via [facility] from [facility] for post op problem. Pt reports that she recently had suprapubic catheter placed on 2/7 and upon waking up this morning she felt like she had urine leaking from around the site. Some purulent drainage and erythema noted during triage. Pt denies fevers.

## 2021-09-11 NOTE — Progress Notes (Signed)
Pharmacy Antibiotic Note  [name] is a 71 y.o. female admitted on 09/11/2021 with  cellulitis and UTI .  Pharmacy has been consulted for vancomycin and cefepime dosing.  Plan: Vancomycin 1500 mg x1, then 1500 mg q24h (eAUC=542.7, goal AUC 400-550, SCr used 0.8) Cefepime 2g q12h (UTI w/o sepsis dosing) Trend WBC, temp, renal function  F/U infectious work-up Drug levels as indicated  Height: 5' 5.5" (166.4 cm) Weight: 71.7 kg (158 lb) IBW/kg (Calculated) : 58.15  Temp (24hrs), Avg:98.4 F (36.9 C), Min:98.4 F (36.9 C), Max:98.4 F (36.9 C)  Recent Labs  Lab 09/11/21 1555  WBC 8.0  CREATININE 0.59    Estimated Creatinine Clearance: 64.8 mL/min (by C-G formula based on SCr of 0.59 mg/dL).    No Known Allergies  Antimicrobials this admission: vancomycin 2/16 >>  cefepime 2/16 >>   Dose adjustments this admission: None  Microbiology results: 2/16 UCx: IP   Thank you for allowing pharmacy to be a part of this patients care.  Joseph Art, Pharm.D. PGY-1 Pharmacy Resident [phone] 09/11/2021 8:48 PM
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
## 2021-09-11 NOTE — ED Notes (Signed)
Patient transported to CT 

## 2021-09-12 ENCOUNTER — Inpatient Hospital Stay (HOSPITAL_COMMUNITY): Payer: Medicare Other

## 2021-09-12 ENCOUNTER — Encounter (HOSPITAL_COMMUNITY): Payer: Self-pay | Admitting: Internal Medicine

## 2021-09-12 ENCOUNTER — Other Ambulatory Visit: Payer: Self-pay

## 2021-09-12 DIAGNOSIS — E785 Hyperlipidemia, unspecified: Secondary | ICD-10-CM | POA: Diagnosis present

## 2021-09-12 DIAGNOSIS — Y738 Miscellaneous gastroenterology and urology devices associated with adverse incidents, not elsewhere classified: Secondary | ICD-10-CM | POA: Diagnosis present

## 2021-09-12 DIAGNOSIS — Z20822 Contact with and (suspected) exposure to covid-19: Secondary | ICD-10-CM | POA: Diagnosis not present

## 2021-09-12 DIAGNOSIS — I5042 Chronic combined systolic (congestive) and diastolic (congestive) heart failure: Secondary | ICD-10-CM | POA: Diagnosis not present

## 2021-09-12 DIAGNOSIS — D638 Anemia in other chronic diseases classified elsewhere: Secondary | ICD-10-CM | POA: Diagnosis not present

## 2021-09-12 DIAGNOSIS — Z823 Family history of stroke: Secondary | ICD-10-CM | POA: Diagnosis not present

## 2021-09-12 DIAGNOSIS — K3184 Gastroparesis: Secondary | ICD-10-CM | POA: Diagnosis present

## 2021-09-12 DIAGNOSIS — S31000A Unspecified open wound of lower back and pelvis without penetration into retroperitoneum, initial encounter: Secondary | ICD-10-CM | POA: Diagnosis present

## 2021-09-12 DIAGNOSIS — M069 Rheumatoid arthritis, unspecified: Secondary | ICD-10-CM | POA: Diagnosis present

## 2021-09-12 DIAGNOSIS — F32A Depression, unspecified: Secondary | ICD-10-CM | POA: Diagnosis not present

## 2021-09-12 DIAGNOSIS — B1920 Unspecified viral hepatitis C without hepatic coma: Secondary | ICD-10-CM | POA: Diagnosis not present

## 2021-09-12 DIAGNOSIS — Z8616 Personal history of COVID-19: Secondary | ICD-10-CM | POA: Diagnosis not present

## 2021-09-12 DIAGNOSIS — Z853 Personal history of malignant neoplasm of breast: Secondary | ICD-10-CM | POA: Diagnosis not present

## 2021-09-12 DIAGNOSIS — Z8505 Personal history of malignant neoplasm of liver: Secondary | ICD-10-CM | POA: Diagnosis not present

## 2021-09-12 DIAGNOSIS — L89159 Pressure ulcer of sacral region, unspecified stage: Secondary | ICD-10-CM | POA: Diagnosis not present

## 2021-09-12 DIAGNOSIS — G8220 Paraplegia, unspecified: Secondary | ICD-10-CM | POA: Diagnosis not present

## 2021-09-12 DIAGNOSIS — Z800 Family history of malignant neoplasm of digestive organs: Secondary | ICD-10-CM | POA: Diagnosis not present

## 2021-09-12 DIAGNOSIS — N390 Urinary tract infection, site not specified: Secondary | ICD-10-CM | POA: Diagnosis present

## 2021-09-12 DIAGNOSIS — T83510A Infection and inflammatory reaction due to cystostomy catheter, initial encounter: Secondary | ICD-10-CM | POA: Diagnosis not present

## 2021-09-12 DIAGNOSIS — N10 Acute tubulo-interstitial nephritis: Secondary | ICD-10-CM

## 2021-09-12 DIAGNOSIS — I110 Hypertensive heart disease with heart failure: Secondary | ICD-10-CM | POA: Diagnosis not present

## 2021-09-12 DIAGNOSIS — K592 Neurogenic bowel, not elsewhere classified: Secondary | ICD-10-CM | POA: Diagnosis not present

## 2021-09-12 DIAGNOSIS — Z9359 Other cystostomy status: Secondary | ICD-10-CM

## 2021-09-12 DIAGNOSIS — L03311 Cellulitis of abdominal wall: Secondary | ICD-10-CM | POA: Diagnosis not present

## 2021-09-12 DIAGNOSIS — T83030A Leakage of cystostomy catheter, initial encounter: Secondary | ICD-10-CM | POA: Diagnosis present

## 2021-09-12 DIAGNOSIS — I2510 Atherosclerotic heart disease of native coronary artery without angina pectoris: Secondary | ICD-10-CM | POA: Diagnosis present

## 2021-09-12 DIAGNOSIS — K7460 Unspecified cirrhosis of liver: Secondary | ICD-10-CM | POA: Diagnosis present

## 2021-09-12 HISTORY — PX: IR CATHETER TUBE CHANGE: IMG717

## 2021-09-12 LAB — BASIC METABOLIC PANEL
Anion gap: 11 (ref 5–15)
BUN: 17 mg/dL (ref 8–23)
CO2: 23 mmol/L (ref 22–32)
Calcium: 9.7 mg/dL (ref 8.9–10.3)
Chloride: 105 mmol/L (ref 98–111)
Creatinine, Ser: 0.59 mg/dL (ref 0.44–1.00)
GFR, Estimated: >60 mL/min (ref >60)
Glucose, Bld: 93 mg/dL (ref 70–99)
Potassium: 3.7 mmol/L (ref 3.5–5.1)
Sodium: 139 mmol/L (ref 135–145)

## 2021-09-12 LAB — CBC
HCT: 38.2 % (ref 36.0–46.0)
HCT: 38.5 % (ref 36.0–46.0)
Hemoglobin: 11.5 g/dL — ABNORMAL LOW (ref 12.0–15.0)
Hemoglobin: 11.6 g/dL — ABNORMAL LOW (ref 12.0–15.0)
MCH: 24.2 pg — ABNORMAL LOW (ref 26.0–34.0)
MCH: 24.3 pg — ABNORMAL LOW (ref 26.0–34.0)
MCHC: 30.1 g/dL (ref 30.0–36.0)
MCHC: 30.1 g/dL (ref 30.0–36.0)
MCV: 80.3 fL (ref 80.0–100.0)
MCV: 80.7 fL (ref 80.0–100.0)
Platelets: 264 10*3/uL (ref 150–400)
Platelets: 272 10*3/uL (ref 150–400)
RBC: 4.76 MIL/uL (ref 3.87–5.11)
RBC: 4.77 MIL/uL (ref 3.87–5.11)
RDW: 17.2 % — ABNORMAL HIGH (ref 11.5–15.5)
RDW: 17.2 % — ABNORMAL HIGH (ref 11.5–15.5)
WBC: 7.7 10*3/uL (ref 4.0–10.5)
WBC: 8 10*3/uL (ref 4.0–10.5)
nRBC: 0 % (ref 0.0–0.2)
nRBC: 0 % (ref 0.0–0.2)

## 2021-09-12 LAB — CREATININE, SERUM
Creatinine, Ser: 0.62 mg/dL (ref 0.44–1.00)
GFR, Estimated: >60 mL/min (ref >60)

## 2021-09-12 LAB — MRSA NEXT GEN BY PCR, NASAL: MRSA by PCR Next Gen: DETECTED — AB

## 2021-09-12 MED ORDER — MIDAZOLAM HCL 2 MG/2ML IJ SOLN
INTRAMUSCULAR | Status: AC
  Filled 2021-09-12: qty 2

## 2021-09-12 MED ORDER — LIDOCAINE HCL 1 % IJ SOLN
INTRAMUSCULAR | Status: DC
Start: 2021-09-12 — End: 2021-09-12
  Filled 2021-09-12: qty 20

## 2021-09-12 MED ORDER — FENTANYL CITRATE (PF) 100 MCG/2ML IJ SOLN
INTRAMUSCULAR | Status: AC
  Filled 2021-09-12: qty 2

## 2021-09-12 MED ORDER — MENTHOL 3 MG MT LOZG
1.0000 | LOZENGE | OROMUCOSAL | Status: DC | PRN
  Administered 2021-09-12 (×3): 3 mg via ORAL
  Filled 2021-09-12 (×4): qty 9

## 2021-09-12 MED ORDER — LIDOCAINE HCL 1 % IJ SOLN
INTRAMUSCULAR | Status: AC
  Filled 2021-09-12: qty 20

## 2021-09-12 MED ORDER — MIDAZOLAM HCL 2 MG/2ML IJ SOLN
INTRAMUSCULAR | Status: AC | PRN
  Administered 2021-09-12: 1 mg via INTRAVENOUS

## 2021-09-12 MED ORDER — GUAIFENESIN-DM 100-10 MG/5ML PO SYRP
5.0000 mL | ORAL_SOLUTION | ORAL | Status: DC | PRN
Start: 2021-09-12 — End: 2021-09-17
  Administered 2021-09-12 – 2021-09-16 (×8): 5 mL via ORAL
  Filled 2021-09-12 (×8): qty 5

## 2021-09-12 MED ORDER — PHENOL 1.4 % MT LIQD
1.0000 | OROMUCOSAL | Status: DC | PRN
  Administered 2021-09-12: 1 via OROMUCOSAL
  Filled 2021-09-12: qty 177

## 2021-09-12 NOTE — TOC Initial Note (Signed)
Transition of Care [facility]) - Initial/Assessment Note    Patient Details  Name: [name] MRN: [id number] Date of Birth: October 28, 1950  Transition of [facility]) CM/SW Contact:    Milinda Antis, [place] Phone Number: 09/12/2021, 1:00 PM  Clinical Narrative:                 CSW met with the patient at bedside.  The patient reports being from [facility] and would like to return at discharge.  Expected Discharge Plan: Long Term Nursing Home Barriers to Discharge: Continued Medical Work up   Patient Goals and CMS Choice Patient states their goals for this hospitalization and ongoing recovery are:: To return to [facility] SNF CMS Medicare.gov Compare Post Acute Care list provided to:: Patient Choice offered to / list presented to : Patient  Expected Discharge Plan and Services Expected Discharge Plan: [place] Acute Care Choice: [place] Living arrangements for the past 2 months: [place] Expected Discharge Date: 09/19/21                                    Prior Living Arrangements/Services Living arrangements for the past 2 months: [place] Lives with:: Facility Resident Patient language and need for interpreter reviewed:: Yes Do you feel safe going back to the place where you live?: Yes      Need for Family Participation in Patient Care: Yes (Comment) Care giver support system in place?: No (comment)   Criminal Activity/Legal Involvement Pertinent to Current Situation/Hospitalization: No - Comment as needed  Activities of Daily Living Home Assistive Devices/Equipment: None ADL Screening (condition at time of admission) Patient's cognitive ability adequate to safely complete daily activities?: Yes Is the patient deaf or have difficulty hearing?: No Does the patient have difficulty seeing, even when wearing glasses/contacts?: No Does the patient have difficulty concentrating,  remembering, or making decisions?: No Patient able to express need for assistance with ADLs?: Yes Does the patient have difficulty dressing or bathing?: Yes Independently performs ADLs?: No Communication: Independent Dressing (OT): Needs assistance Is this a change from baseline?: Pre-admission baseline Grooming: Needs assistance Is this a change from baseline?: Pre-admission baseline Feeding: Independent Bathing: Dependent Is this a change from baseline?: Pre-admission baseline Toileting: Dependent Is this a change from baseline?: Pre-admission baseline In/Out Bed: Dependent Is this a change from baseline?: Pre-admission baseline Walks in Home: Dependent Is this a change from baseline?: Pre-admission baseline Does the patient have difficulty walking or climbing stairs?: Yes Weakness of Legs: Both Weakness of Arms/Hands: None  Permission Sought/Granted   Permission granted to share information with : Yes, Verbal Permission Granted     Permission granted to share info w AGENCY: [facility]        Emotional Assessment Appearance:: Appears stated age Attitude/Demeanor/Rapport: Engaged Affect (typically observed): Appropriate Orientation: : Oriented to Self, Oriented to Place, Oriented to  Time, Oriented to Situation Alcohol / Substance Use: Not Applicable Psych Involvement: No (comment)  Admission diagnosis:  Complicated UTI (urinary tract infection) [N39.0] Suprapubic catheter dysfunction, initial encounter ([place]) [T83.010A] Infective urethritis [N34.2] Cellulitis, unspecified cellulitis site [L03.90] UTI (urinary tract infection) [N39.0] Patient Active Problem List   Diagnosis Date Noted   Suprapubic catheter ([place]) 09/12/2021   Acute pyelonephritis 09/12/2021   Chronic combined systolic and diastolic CHF (congestive heart failure) ([place]) 76/81/1572   Complicated UTI (urinary tract  infection) 09/11/2021   Cellulitis 09/11/2021   AKI (acute kidney injury) ([place])     Bacteremia due to Klebsiella pneumoniae    UTI (urinary tract infection) 07/23/2021   Dry gangrene ([place]) 04/10/2021   Osteomyelitis ([place]) 04/09/2021   Hiatal hernia with GERD 02/07/2021   Hepatocellular carcinoma ([place]) 02/07/2021   Sepsis secondary to UTI ([place]) 12/08/2020   Spasticity 12/06/2020   Contracture of lower leg joint 12/06/2020   Autonomic dysfunction 08/30/2020   Neurogenic bowel 08/30/2020   Wheelchair dependence 08/30/2020   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   Medication management 06/07/2018   Cough 06/07/2018   Cirrhosis ([place]) 04/20/2018   Obstipation/fecal impaction 02/12/2018   Intractable vomiting 02/11/2018   Chronic hepatitis C without hepatic coma ([place]) 01/10/2018   NICM (nonischemic cardiomyopathy) ([place]) 08/25/2017   Normal coronary arteries 62/09/5595   Diastolic dysfunction 41/63/8453   S/P thoracentesis    Status post thoracentesis    S/p percutaneous right heart catheterization    Pleural effusion on right 06/29/2017   Respiratory failure with hypoxia ([place]) 06/29/2017   Pressure injury of skin 06/29/2017   Shortness of breath    Hypokalemia 09/21/2016   Fever in adult 07/28/2016   Decubitus ulcer of coccyx 02/04/2016   Hyperglycemia 12/03/2015   H/O bilateral mastectomy 07/25/2015   Benign hypertensive heart disease without heart failure 07/15/2015   Hiatal hernia 07/06/2015   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   History of breast cancer 08/21/2014   Adynamic ileus ([place]) 07/22/2014   Elevated liver enzymes 07/22/2014   CHF (congestive heart failure) ([place]) 07/05/2014   Gastroparesis 07/05/2014   Microcytic anemia 01/05/2014   Insomnia 07/14/2013   Edema 05/01/2013   Rheumatoid arthritis ([place]) 02/23/2013   GERD (gastroesophageal reflux disease) 02/23/2013   Essential hypertension 02/23/2013   Neurogenic bladder 02/23/2013   Paraplegia ([place]) 02/23/2013   Depression 02/23/2013   Allergic rhinitis  02/23/2013   Constipation 02/23/2013   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 [place] [address] Phone: [phone] Fax: [phone]     Social Determinants of Health (SDOH) Interventions    Readmission Risk Interventions No flowsheet data found.
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
## 2021-09-12 NOTE — Progress Notes (Signed)
Pt states she has never used a CPAP at home and does not want to wear tonight.

## 2021-09-12 NOTE — Procedures (Signed)
°  Procedure: Exchange/upsize suprapubic cath to 80F EBL:   minimal Complications:  none immediate  See full dictation in [facility].  Dillard Cannon MD Main # [phone] Pager  [phone] Mobile [phone]

## 2021-09-13 DIAGNOSIS — N390 Urinary tract infection, site not specified: Secondary | ICD-10-CM | POA: Diagnosis not present

## 2021-09-13 LAB — CBC
HCT: 38.7 % (ref 36.0–46.0)
Hemoglobin: 12 g/dL (ref 12.0–15.0)
MCH: 24.5 pg — ABNORMAL LOW (ref 26.0–34.0)
MCHC: 31 g/dL (ref 30.0–36.0)
MCV: 79 fL — ABNORMAL LOW (ref 80.0–100.0)
Platelets: 293 10*3/uL (ref 150–400)
RBC: 4.9 MIL/uL (ref 3.87–5.11)
RDW: 17.2 % — ABNORMAL HIGH (ref 11.5–15.5)
WBC: 8.1 10*3/uL (ref 4.0–10.5)
nRBC: 0 % (ref 0.0–0.2)

## 2021-09-13 LAB — COMPREHENSIVE METABOLIC PANEL
ALT: 34 U/L (ref 0–44)
AST: 25 U/L (ref 15–41)
Albumin: 3.1 g/dL — ABNORMAL LOW (ref 3.5–5.0)
Alkaline Phosphatase: 95 U/L (ref 38–126)
Anion gap: 11 (ref 5–15)
BUN: 17 mg/dL (ref 8–23)
CO2: 22 mmol/L (ref 22–32)
Calcium: 9.9 mg/dL (ref 8.9–10.3)
Chloride: 105 mmol/L (ref 98–111)
Creatinine, Ser: 0.79 mg/dL (ref 0.44–1.00)
GFR, Estimated: >60 mL/min (ref >60)
Glucose, Bld: 114 mg/dL — ABNORMAL HIGH (ref 70–99)
Potassium: 4.2 mmol/L (ref 3.5–5.1)
Sodium: 138 mmol/L (ref 135–145)
Total Bilirubin: 0.4 mg/dL (ref 0.3–1.2)
Total Protein: 8.5 g/dL — ABNORMAL HIGH (ref 6.5–8.1)

## 2021-09-13 MED ORDER — SODIUM CHLORIDE 0.9 % IV SOLN: INTRAVENOUS | Status: AC

## 2021-09-13 MED ORDER — LIDOCAINE VISCOUS HCL 2 % MT SOLN
15.0000 mL | OROMUCOSAL | Status: DC | PRN
  Administered 2021-09-13 – 2021-09-16 (×7): 15 mL via OROMUCOSAL
  Filled 2021-09-13 (×8): qty 15

## 2021-09-13 MED ORDER — CARVEDILOL 6.25 MG PO TABS
6.2500 mg | ORAL_TABLET | Freq: Two times a day (BID) | ORAL | Status: DC
  Administered 2021-09-13 – 2021-09-16 (×7): 6.25 mg via ORAL
  Filled 2021-09-13 (×7): qty 1

## 2021-09-13 NOTE — Progress Notes (Signed)
Pt states that she has never worn CPAP, nor does she want to. Patient was very agitated when asked about wearing CPAP. Please discontinue this order. Thank you.

## 2021-09-13 NOTE — TOC Progression Note (Signed)
Transition of Care [facility]) - Progression Note    Patient Details  Name: [name] MRN: [id number] Date of Birth: Dec 10, 1950  Transition of Care [facility]) CM/SW [place], [place] Phone Number: 09/13/2021, 1:41 PM  Clinical Narrative:   CSW attempting to reach Admissions at [facility] to see if patient can return tomorrow, pending medical stability. No answer, left a message, awaiting response. CSW to follow.    Expected Discharge Plan: [place] Barriers to Discharge: Continued Medical Work up  Expected Discharge Plan and Services Expected Discharge Plan: [place] Choice: [place] arrangements for the past 2 months: [place] Expected Discharge Date: 09/19/21                                     Social Determinants of Health (SDOH) Interventions    Readmission Risk Interventions No flowsheet data found.

## 2021-09-13 NOTE — Progress Notes (Signed)
Pharmacy Antibiotic Note  [name] is a 71 y.o. female admitted on 09/11/2021 with  UTI s/p suprapubic catheter placement and redness of skin around catheter concerning for cellulitis.  Pharmacy has been consulted for vancomycin and cefepime dosing.  2/16 Urine culture growing >100,000 colonies GNRs- messaged Dr. Cruzita Lederer about stopping vancomycin and continuing cefepime while awaiting speciation and sensitivities. He agreed.   Of note, patient is paraplegic and Scr likely does not provide best estimation of renal function.   Plan: STOP Vancomycin  CONTINUE Cefepime 2g q12h (UTI w/o sepsis dosing) Trend WBC, temp, renal function  F/U infectious work-up and narrow as able   Height: 5\' 5"  (165.1 cm) Weight: 70.4 kg (155 lb 4.8 oz) IBW/kg (Calculated) : 57  Temp (24hrs), Avg:98.2 F (36.8 C), Min:97.6 F (36.4 C), Max:98.8 F (37.1 C)  Recent Labs  Lab 09/11/21 1555 09/11/21 2354 09/12/21 0532 09/13/21 0125  WBC 8.0 7.7 8.0 8.1  CREATININE 0.59 0.62 0.59 0.79     Estimated Creatinine Clearance: 63.5 mL/min (by C-G formula based on SCr of 0.79 mg/dL).    No Known Allergies  Antimicrobials this admission: vancomycin 2/16 >> 2/18 cefepime 2/16 >>   Dose adjustments this admission: None  Microbiology results: 2/16 UCx: >100,000 colonies GNRs   Thank you for allowing pharmacy to be a part of this patients care.  Adria Dill, PharmD PGY-1 Acute Care Resident  09/13/2021 10:34 AM

## 2021-09-13 NOTE — Plan of Care (Signed)
Problem: Clinical Measurements: Goal: Diagnostic test results will improve Outcome: Completed/Met Goal: Respiratory complications will improve Outcome: Completed/Met Goal: Cardiovascular complication will be avoided Outcome: Completed/Met   

## 2021-09-13 NOTE — Plan of Care (Signed)
Problem: Education: °Goal: Knowledge of General Education information will improve °Description: Including pain rating scale, medication(s)/side effects and non-pharmacologic comfort measures °Outcome: Completed/Met °  °
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
## 2021-09-14 DIAGNOSIS — N390 Urinary tract infection, site not specified: Secondary | ICD-10-CM | POA: Diagnosis not present

## 2021-09-14 LAB — RESP PANEL BY RT-PCR (FLU A&B, COVID) ARPGX2
Influenza A by PCR: NEGATIVE
Influenza B by PCR: NEGATIVE
SARS Coronavirus 2 by RT PCR: NEGATIVE

## 2021-09-14 LAB — URINE CULTURE: Culture: >=100000 — AB

## 2021-09-14 NOTE — Progress Notes (Signed)
Patient refused CPAP at this time. Patient seemed frustrated as she has been asked about CPAP previously.
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
## 2021-09-15 DIAGNOSIS — B1920 Unspecified viral hepatitis C without hepatic coma: Secondary | ICD-10-CM | POA: Diagnosis not present

## 2021-09-15 DIAGNOSIS — N390 Urinary tract infection, site not specified: Secondary | ICD-10-CM

## 2021-09-15 DIAGNOSIS — G8220 Paraplegia, unspecified: Secondary | ICD-10-CM | POA: Diagnosis not present

## 2021-09-15 DIAGNOSIS — K7460 Unspecified cirrhosis of liver: Secondary | ICD-10-CM | POA: Diagnosis not present

## 2021-09-15 DIAGNOSIS — K592 Neurogenic bowel, not elsewhere classified: Secondary | ICD-10-CM

## 2021-09-15 DIAGNOSIS — S14107D Unspecified injury at C7 level of cervical spinal cord, subsequent encounter: Secondary | ICD-10-CM

## 2021-09-15 DIAGNOSIS — Z8505 Personal history of malignant neoplasm of liver: Secondary | ICD-10-CM

## 2021-09-15 DIAGNOSIS — N319 Neuromuscular dysfunction of bladder, unspecified: Secondary | ICD-10-CM

## 2021-09-15 MED ORDER — LORATADINE 10 MG PO TABS
10.0000 mg | ORAL_TABLET | Freq: Every day | ORAL | Status: DC
  Administered 2021-09-15 – 2021-09-16 (×2): 10 mg via ORAL
  Filled 2021-09-15 (×2): qty 1

## 2021-09-15 MED ORDER — POLYVINYL ALCOHOL 1.4 % OP SOLN
1.0000 [drp] | OPHTHALMIC | Status: DC | PRN
  Administered 2021-09-15 (×2): 1 [drp] via OPHTHALMIC
  Filled 2021-09-15: qty 15

## 2021-09-15 MED ORDER — ARTIFICIAL TEARS OPHTHALMIC OINT
TOPICAL_OINTMENT | OPHTHALMIC | Status: DC | PRN
  Filled 2021-09-15: qty 3.5
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
## 2021-09-15 NOTE — TOC Progression Note (Signed)
Transition of Care [facility]) - Initial/Assessment Note    Patient Details  Name: [name] MRN: [id number] Date of Birth: 08/21/1950  Transition of [facility]) CM/SW Contact:    Milinda Antis, [place] Phone Number: 09/15/2021, 10:11 AM  Clinical Narrative:                 CSW attempted to contact admissions with [facility] as the patient is medically ready to return.  There was no answer.  CSW left a VM requesting a returned call.   Expected Discharge Plan: [place] Barriers to Discharge: Continued Medical Work up   Patient Goals and CMS Choice Patient states their goals for this hospitalization and ongoing recovery are:: To return to [facility] CMS Medicare.gov Compare Post Acute Care list provided to:: Patient Choice offered to / list presented to : Patient  Expected Discharge Plan and Services Expected Discharge Plan: [place] Acute Care Choice: [place] Living arrangements for the past 2 months: [place] Expected Discharge Date: 09/19/21                                    Prior Living Arrangements/Services Living arrangements for the past 2 months: [place] Lives with:: Facility Resident Patient language and need for interpreter reviewed:: Yes Do you feel safe going back to the place where you live?: Yes      Need for Family Participation in Patient Care: Yes (Comment) Care giver support system in place?: No (comment)   Criminal Activity/Legal Involvement Pertinent to Current Situation/Hospitalization: No - Comment as needed  Activities of Daily Living Home Assistive Devices/Equipment: None ADL Screening (condition at time of admission) Patient's cognitive ability adequate to safely complete daily activities?: Yes Is the patient deaf or have difficulty hearing?: No Does the patient have difficulty seeing, even when wearing glasses/contacts?: No Does the  patient have difficulty concentrating, remembering, or making decisions?: No Patient able to express need for assistance with ADLs?: Yes Does the patient have difficulty dressing or bathing?: Yes Independently performs ADLs?: No Communication: Independent Dressing (OT): Needs assistance Is this a change from baseline?: Pre-admission baseline Grooming: Needs assistance Is this a change from baseline?: Pre-admission baseline Feeding: Independent Bathing: Dependent Is this a change from baseline?: Pre-admission baseline Toileting: Dependent Is this a change from baseline?: Pre-admission baseline In/Out Bed: Dependent Is this a change from baseline?: Pre-admission baseline Walks in Home: Dependent Is this a change from baseline?: Pre-admission baseline Does the patient have difficulty walking or climbing stairs?: Yes Weakness of Legs: Both Weakness of Arms/Hands: None  Permission Sought/Granted   Permission granted to share information with : Yes, Verbal Permission Granted     Permission granted to share info w AGENCY: [facility]        Emotional Assessment Appearance:: Appears stated age Attitude/Demeanor/Rapport: Engaged Affect (typically observed): Appropriate Orientation: : Oriented to Self, Oriented to Place, Oriented to  Time, Oriented to Situation Alcohol / Substance Use: Not Applicable Psych Involvement: No (comment)  Admission diagnosis:  Complicated UTI (urinary tract infection) [N39.0] Suprapubic catheter dysfunction, initial encounter ([place]) [T83.010A] Infective urethritis [N34.2] Cellulitis, unspecified cellulitis site [L03.90] UTI (urinary tract infection) [N39.0] Patient Active Problem List   Diagnosis Date Noted   Suprapubic catheter ([place]) 09/12/2021   Acute pyelonephritis 09/12/2021   Chronic combined systolic and diastolic CHF (congestive heart failure) ([place]) 09/12/2021  Complicated UTI (urinary tract infection) 09/11/2021   Cellulitis 09/11/2021    AKI (acute kidney injury) ([place])    Bacteremia due to Klebsiella pneumoniae    UTI (urinary tract infection) 07/23/2021   Dry gangrene ([place]) 04/10/2021   Osteomyelitis ([place]) 04/09/2021   Hiatal hernia with GERD 02/07/2021   Hepatocellular carcinoma ([place]) 02/07/2021   Sepsis secondary to UTI ([place]) 12/08/2020   Spasticity 12/06/2020   Contracture of lower leg joint 12/06/2020   Autonomic dysfunction 08/30/2020   Neurogenic bowel 08/30/2020   Wheelchair dependence 08/30/2020   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   Medication management 06/07/2018   Cough 06/07/2018   Cirrhosis ([place]) 04/20/2018   Obstipation/fecal impaction 02/12/2018   Intractable vomiting 02/11/2018   Chronic hepatitis C without hepatic coma ([place]) 01/10/2018   NICM (nonischemic cardiomyopathy) ([place]) 08/25/2017   Normal coronary arteries 80/32/1224   Diastolic dysfunction 82/50/0370   S/P thoracentesis    Status post thoracentesis    S/p percutaneous right heart catheterization    Pleural effusion on right 06/29/2017   Respiratory failure with hypoxia ([place]) 06/29/2017   Pressure injury of skin 06/29/2017   Shortness of breath    Hypokalemia 09/21/2016   Fever in adult 07/28/2016   Decubitus ulcer of coccyx 02/04/2016   Hyperglycemia 12/03/2015   H/O bilateral mastectomy 07/25/2015   Benign hypertensive heart disease without heart failure 07/15/2015   Hiatal hernia 07/06/2015   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   History of breast cancer 08/21/2014   Adynamic ileus ([place]) 07/22/2014   Elevated liver enzymes 07/22/2014   CHF (congestive heart failure) ([place]) 07/05/2014   Gastroparesis 07/05/2014   Microcytic anemia 01/05/2014   Insomnia 07/14/2013   Edema 05/01/2013   Rheumatoid arthritis ([place]) 02/23/2013   GERD (gastroesophageal reflux disease) 02/23/2013   Essential hypertension 02/23/2013   Neurogenic bladder 02/23/2013   Paraplegia ([place]) 02/23/2013   Depression  02/23/2013   Allergic rhinitis 02/23/2013   Constipation 02/23/2013   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 [place] [address] Phone: [phone] Fax: [phone]     Social Determinants of Health (SDOH) Interventions    Readmission Risk Interventions No flowsheet data found.

## 2021-09-15 NOTE — Consult Note (Addendum)
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 functionality of the existing percutaneous drainage catheter. After fluoroscopic guided exchange, a new 16 Fr pigtail catheter is appropriately positioned within the urinary bladder. IMPRESSION: Technically successful fluoroscopic guided exchange of 16 Fr pigtail suprapubic catheter for the purposes of chronic urinary bladder  decompression. Electronically Signed   By: Lucrezia Europe M.D.   On: 09/12/2021 16:08   CT IMAGE GUIDED DRAINAGE BY PERCUTANEOUS CATHETER  Result Date: 09/02/2021 INDICATION: Paraplegia.  Incontinence.  Chronic indwelling Foley catheter. EXAM: CT GUIDED SUPRAPUBIC CATHETER PLACEMENT  COMPARISON:  CT AP, most recently 07/26/2021 MEDICATIONS: Ancef 2 g IV ANESTHESIA/SEDATION: Moderate (conscious) sedation was employed during this procedure. A total of Versed 0.5 mg and Fentanyl 25 mcg was administered intravenously. Moderate Sedation Time: 28 minutes. The patient's level of consciousness and vital signs were monitored continuously by radiology nursing throughout the procedure under my direct supervision. CONTRAST:  None COMPLICATIONS: None immediate. PROCEDURE: The procedure, risks, benefits, and alternatives were explained to the patient. Questions regarding the procedure were encouraged and answered. The patient understands and consents to the procedure. A timeout was performed prior to the initiation of the procedure. The patient was positioned supine on the CT gantry. Preprocedural imaging was obtained of the lower pelvis. The skin overlying the anterior aspect the lower pelvis was prepped and draped in usual sterile fashion. The patient's the urinary bladder was then distended with the administration of approximately 150 cc of saline via the existing Foley catheter. Under CT guidance, a 22 gauge needle was utilized for the purposes of procedural planning after the overlying soft tissues were anesthetized with 1% lidocaine. Appropriate trajectory was confirmed with a limited lower abdominal/pelvic CT. Next, an 18 gauge trocar needle was advanced into the urinary bladder under intermittent CT guidance. An image was saved for procedural documentation purposes. A short Amplatz wire was coiled within the urinary bladder. Appropriate position was confirmed with a limited pelvic CT. The track was serially dilated ultimately allowing placement of a 14 French all-purpose drainage catheter with end coiled and locked within the urinary bladder. The catheter was connected to a gravity bag. The exit site of the catheter was secured within interrupted suture. A dressing was placed. The patient tolerated  the procedure well without immediate postprocedural complication. FINDINGS: CT imaging demonstrates decompression of the urinary bladder via a Foley catheter. There is no bowel interposed between the anterior aspect of the urinary bladder and the ventral lower abdominal/pelvic wall. After distension of the urinary bladder with the administration of saline via the Foley catheter, CT was utilized for the placement of a 14 Fr percutaneous drainage catheter with end coiled and locked within the urinary bladder. IMPRESSION: Successful CT-guided placement of a 14 Fr suprapubic catheter as above. PLAN: The patient may return for sizing to a 16 Fr Council tip suprapubic drainage catheter in approximately 2 weeks. Michaelle Birks, MD [facility] Electronically Signed   By: Michaelle Birks M.D.   On: 09/02/2021 11:26    I spent more than 80 minutes for this patient encounter including review of prior medical records/discussing diagnostics and treatment plan with the patient/family/coordinate care with primary/other specialits with greater than 50% of time in face to face encounter.   Electronically signed by:   Rosiland Oz, MD Infectious Disease Physician [facility] Pager: [phone]

## 2021-09-15 NOTE — Care Management Important Message (Signed)
Important Message  Patient Details  Name: [name] MRN: [id number] Date of Birth: 09-25-1950   Medicare Important Message Given:  Yes Patient has a contact Precaution order in place will mail to the patients home address.     Kenard Morawski 09/15/2021, 1:40 PM

## 2021-09-15 NOTE — Progress Notes (Signed)
Patient refused CPAP X 3. States she does not wish to wear during hospital stay. Order discontinued,.

## 2021-09-16 DIAGNOSIS — N390 Urinary tract infection, site not specified: Secondary | ICD-10-CM | POA: Diagnosis not present

## 2021-09-16 LAB — HCV RNA QUANT: HCV Quantitative: NOT DETECTED [IU]/mL

## 2021-09-16 MED ORDER — POLYVINYL ALCOHOL 1.4 % OP SOLN
1.0000 [drp] | OPHTHALMIC | Status: DC | PRN
  Filled 2021-09-16: qty 15

## 2021-09-16 MED ORDER — SODIUM CHLORIDE 0.9 % IV SOLN: 2.0000 g | Freq: Two times a day (BID) | INTRAVENOUS | Status: AC

## 2021-09-16 MED ORDER — TRAMADOL HCL 50 MG PO TABS: 50.0000 mg | ORAL_TABLET | Freq: Four times a day (QID) | ORAL | 0 refills | Status: AC | PRN

## 2021-09-16 MED ORDER — BACLOFEN 10 MG PO TABS: 10.0000 mg | ORAL_TABLET | Freq: Three times a day (TID) | ORAL | 0 refills | Status: AC

## 2021-09-16 MED ORDER — BACLOFEN 10 MG PO TABS: 10.0000 mg | ORAL_TABLET | Freq: Three times a day (TID) | ORAL | 0 refills | Status: DC

## 2021-09-16 MED ORDER — DOCUSATE SODIUM 100 MG PO CAPS: 100.0000 mg | ORAL_CAPSULE | Freq: Three times a day (TID) | ORAL | 0 refills | Status: DC | PRN

## 2021-09-16 MED ORDER — SERTRALINE HCL 50 MG PO TABS: 50.0000 mg | ORAL_TABLET | Freq: Every day | ORAL | Status: DC

## 2021-09-16 MED ORDER — LORAZEPAM 0.5 MG PO TABS: 0.5000 mg | ORAL_TABLET | Freq: Every day | ORAL | 0 refills | Status: AC

## 2021-09-16 NOTE — Progress Notes (Signed)
DISCHARGE NOTE [facility] to be discharged [facility] per MD order. Patient verbalized understanding.  Skin clean, dry and intact without evidence of skin break down, no evidence of skin tears noted. IV catheter discontinued intact. Site without signs and symptoms of complications. Dressing and pressure applied. Pt denies pain at the site currently. No complaints noted.  Patient free of lines, drains, and wounds.   Discharge packet assembled. An After Visit Summary (AVS) was printed and given to the EMS personnel. Patient escorted via stretcher and discharged to [facility] via ambulance. Report called to accepting facility; all questions and concerns addressed.   Rushie Goltz, RN

## 2021-09-16 NOTE — Discharge Summary (Addendum)
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 (meaning this test can be used) for the duration of the COVID-19 declaration under Section 564(b)(1) of the Act, 21 U.S.C. section 360bbb-3(b)(1), unless the authorization is terminated or revoked.  Performed at [facility], [place] [address], [place], [place] [address]   MRSA Next Gen by PCR, Nasal     Status: Abnormal   Collection Time: 09/12/21  6:47 AM   Specimen: Nasal Mucosa; Nasal Swab  Result Value Ref Range Status   MRSA by PCR Next Gen DETECTED (A) NOT DETECTED Final    Comment: RESULTS CALLED TO,READ BACK BY AND VERIFIED WITH RN A.WILLIAMS @0931  ON 09/12/21 BY NM (NOTE) The GeneXpert MRSA Assay (FDA approved for NASAL specimens only), is one component of a comprehensive MRSA colonization surveillance program. It is not intended to diagnose MRSA infection nor to guide or monitor treatment  for MRSA infections. Test performance is not FDA approved in patients less than 56 years old. Performed at [facility], [place] [address], [place], [place] [address]   Resp Panel by RT-PCR (Flu A&B, Covid) Nasopharyngeal Swab     Status: None   Collection Time: 09/14/21  6:03 PM   Specimen: Nasopharyngeal  Swab; Nasopharyngeal(NP) swabs in vial transport medium  Result Value Ref Range Status   SARS Coronavirus 2 by RT PCR NEGATIVE NEGATIVE Final    Comment: (NOTE) SARS-CoV-2 target nucleic acids are NOT DETECTED.  The SARS-CoV-2 RNA is generally detectable in upper respiratory specimens during the acute phase of infection. The lowest concentration of SARS-CoV-2 viral copies this assay can detect is 138 copies/mL. A negative result does not preclude SARS-Cov-2 infection and should not be used as the sole basis for treatment or other patient management decisions. A negative result may occur with  improper specimen collection/handling, submission of specimen other than nasopharyngeal swab, presence of viral mutation(s) within the areas targeted by this assay, and inadequate number of viral copies(<138 copies/mL). A negative result must be combined with clinical observations, patient history, and epidemiological information. The expected result is Negative.  Fact Sheet for Patients:  EntrepreneurPulse.com.au  Fact Sheet for Healthcare Providers:  IncredibleEmployment.be  This test is no t yet approved or cleared by the [place] FDA and  has been authorized for detection and/or diagnosis of SARS-CoV-2 by FDA under an Emergency Use Authorization (EUA). This EUA will remain  in effect (meaning this test can be used) for the duration of the COVID-19 declaration under Section 564(b)(1) of the Act, 21 U.S.C.section 360bbb-3(b)(1), unless the authorization is terminated  or revoked sooner.       Influenza A by PCR NEGATIVE NEGATIVE Final    Influenza B by PCR NEGATIVE NEGATIVE Final    Comment: (NOTE) The Xpert Xpress SARS-CoV-2/FLU/RSV plus assay is intended as an aid in the diagnosis of influenza from Nasopharyngeal swab specimens and should not be used as a sole basis for treatment. Nasal washings and aspirates are unacceptable for Xpert Xpress SARS-CoV-2/FLU/RSV testing.  Fact Sheet for Patients: EntrepreneurPulse.com.au  Fact Sheet for Healthcare Providers: IncredibleEmployment.be  This test is not yet approved or cleared by the [place] FDA and has been authorized for detection and/or diagnosis of SARS-CoV-2 by FDA under an Emergency Use Authorization (EUA). This EUA will remain in effect (meaning this test can be used) for the duration of the COVID-19 declaration under Section 564(b)(1) of the Act, 21 U.S.C. section 360bbb-3(b)(1), unless the authorization is terminated or revoked.  Performed at [facility], [place] [address] [place], [place] [address]      Labs: BNP (last 3 results) Recent Labs    12/08/20 0641  BNP 66.4   Basic Metabolic Panel: Recent Labs  Lab 09/11/21 1555 09/11/21 2354 09/12/21 0532 09/13/21 0125  NA 137  --  139 138  K 4.0  --  3.7 4.2  CL 102  --  105 105  CO2 25  --  23 22  GLUCOSE 82  --  93 114*  BUN 14  --  17 17  CREATININE 0.59 0.62 0.59 0.79  CALCIUM 9.6  --  9.7 9.9   Liver Function Tests: Recent Labs  Lab 09/11/21 1555 09/13/21 0125  AST 25 25  ALT 33 34  ALKPHOS 93 95  BILITOT 0.4 0.4  PROT 8.4* 8.5*  ALBUMIN 3.0* 3.1*   No results for input(s): LIPASE, AMYLASE in the last 168 hours. No results for input(s): AMMONIA in the last 168 hours. CBC: Recent Labs  Lab 09/11/21 1555 09/11/21 2354 09/12/21 0532 09/13/21 0125  WBC 8.0 7.7 8.0 8.1  NEUTROABS 5.3  --   --   --   HGB 11.3* 11.5* 11.6* 12.0  HCT 37.2 38.2 38.5 38.7  MCV 79.8* 80.3 80.7 79.0*  PLT 307 272 264 293   Cardiac Enzymes: No  results for input(s): CKTOTAL, CKMB, CKMBINDEX, TROPONINI in the last 168 hours. BNP: Invalid input(s): POCBNP CBG: No results for input(s): GLUCAP in the last 168 hours. D-Dimer No results for input(s): DDIMER in the last 72 hours. Hgb A1c No results for input(s): HGBA1C in the last 72 hours. Lipid Profile No results for input(s): CHOL, HDL, LDLCALC, TRIG, CHOLHDL, LDLDIRECT in the last 72 hours. Thyroid function studies No results for input(s): TSH, T4TOTAL, T3FREE, THYROIDAB in the last 72 hours.  Invalid input(s): FREET3 Anemia work up No results for input(s): VITAMINB12, FOLATE, FERRITIN, TIBC, IRON, RETICCTPCT in the last 72 hours. Urinalysis    Component Value Date/Time   COLORURINE AMBER (A) 09/11/2021 1930   APPEARANCEUR CLOUDY (A) 09/11/2021 1930   LABSPEC 1.018 09/11/2021 1930   PHURINE 5.0 09/11/2021 1930   GLUCOSEU NEGATIVE 09/11/2021 1930   HGBUR MODERATE (A) 09/11/2021 1930   BILIRUBINUR NEGATIVE 09/11/2021 1930   KETONESUR NEGATIVE 09/11/2021 1930   PROTEINUR 100 (A) 09/11/2021 1930   UROBILINOGEN 1.0 09/30/2010 1854   NITRITE POSITIVE (A) 09/11/2021 1930   LEUKOCYTESUR LARGE (A) 09/11/2021 1930   Sepsis Labs Invalid input(s): PROCALCITONIN,  WBC,  LACTICIDVEN Microbiology Recent Results (from the past 240 hour(s))  Urine Culture     Status: Abnormal   Collection Time: 09/11/21  2:59 PM   Specimen: Urine, Suprapubic  Result Value Ref Range Status   Specimen Description URINE, SUPRAPUBIC  Final   Special Requests   Final    NONE Performed at [facility], [place] [address], [place], [place] [address]    Culture (A)  Final    >=100,000 COLONIES/mL PROVIDENCIA STUARTII >=100,000 COLONIES/mL ENTEROCOCCUS FAECALIS VANCOMYCIN RESISTANT ENTEROCOCCUS    Report Status 09/14/2021 FINAL  Final   Organism ID, Bacteria PROVIDENCIA STUARTII (A)  Final   Organism ID, Bacteria ENTEROCOCCUS FAECALIS (A)  Final      Susceptibility   Enterococcus faecalis - MIC*     AMPICILLIN <=2 SENSITIVE Sensitive     NITROFURANTOIN <=16 SENSITIVE Sensitive     VANCOMYCIN >=32 RESISTANT Resistant     LINEZOLID 2 SENSITIVE Sensitive     * >=100,000 COLONIES/mL ENTEROCOCCUS FAECALIS   Providencia stuartii - MIC*    AMPICILLIN >=32 RESISTANT Resistant     CEFAZOLIN >=64 RESISTANT Resistant     CEFEPIME <=0.12 SENSITIVE Sensitive     CEFTRIAXONE <=0.25 SENSITIVE Sensitive     CIPROFLOXACIN >=4 RESISTANT Resistant     GENTAMICIN RESISTANT Resistant     IMIPENEM 2 SENSITIVE Sensitive     NITROFURANTOIN 128 RESISTANT Resistant     TRIMETH/SULFA >=320 RESISTANT Resistant     AMPICILLIN/SULBACTAM >=32 RESISTANT Resistant     PIP/TAZO <=4 SENSITIVE Sensitive     * >=100,000 COLONIES/mL PROVIDENCIA STUARTII  Resp Panel by RT-PCR (Flu A&B, Covid) Urine, Clean Catch     Status: None   Collection Time: 09/11/21  7:11 PM   Specimen: Urine, Clean Catch; Nasopharyngeal(NP) swabs in vial transport medium  Result Value Ref Range Status   SARS Coronavirus 2 by RT PCR NEGATIVE NEGATIVE Final    Comment: (NOTE) SARS-CoV-2 target nucleic acids are NOT DETECTED.  The SARS-CoV-2 RNA is generally detectable in upper respiratory specimens during the acute phase of infection. The lowest concentration of SARS-CoV-2 viral copies this assay can detect is 138 copies/mL. A negative result does not preclude SARS-Cov-2 infection and should not be used  as the sole basis for treatment or other patient management decisions. A negative result may occur with  improper specimen collection/handling, submission of specimen other than nasopharyngeal swab, presence of viral mutation(s) within the areas targeted by this assay, and inadequate number of viral copies(<138 copies/mL). A negative result must be combined with clinical observations, patient history, and epidemiological information. The expected result is Negative.  Fact Sheet for Patients:   EntrepreneurPulse.com.au  Fact Sheet for Healthcare Providers:  IncredibleEmployment.be  This test is no t yet approved or cleared by the [place] FDA and  has been authorized for detection and/or diagnosis of SARS-CoV-2 by FDA under an Emergency Use Authorization (EUA). This EUA will remain  in effect (meaning this test can be used) for the duration of the COVID-19 declaration under Section 564(b)(1) of the Act, 21 U.S.C.section 360bbb-3(b)(1), unless the authorization is terminated  or revoked sooner.       Influenza A by PCR NEGATIVE NEGATIVE Final   Influenza B by PCR NEGATIVE NEGATIVE Final    Comment: (NOTE) The Xpert Xpress SARS-CoV-2/FLU/RSV plus assay is intended as an aid in the diagnosis of influenza from Nasopharyngeal swab specimens and should not be used as a sole basis for treatment. Nasal washings and aspirates are unacceptable for Xpert Xpress SARS-CoV-2/FLU/RSV testing.  Fact Sheet for Patients: EntrepreneurPulse.com.au  Fact Sheet for Healthcare Providers: IncredibleEmployment.be  This test is not yet approved or cleared by the [place] FDA and has been authorized for detection and/or diagnosis of SARS-CoV-2 by FDA under an Emergency Use Authorization (EUA). This EUA will remain in effect (meaning this test can be used) for the duration of the COVID-19 declaration under Section 564(b)(1) of the Act, 21 U.S.C. section 360bbb-3(b)(1), unless the authorization is terminated or revoked.  Performed at [facility], [place] [address], [place], [place] [address]   MRSA Next Gen by PCR, Nasal     Status: Abnormal   Collection Time: 09/12/21  6:47 AM   Specimen: Nasal Mucosa; Nasal Swab  Result Value Ref Range Status   MRSA by PCR Next Gen DETECTED (A) NOT DETECTED Final    Comment: RESULTS CALLED TO,READ BACK BY AND VERIFIED WITH RN A.WILLIAMS @0931  ON 09/12/21 BY  NM (NOTE) The GeneXpert MRSA Assay (FDA approved for NASAL specimens only), is one component of a comprehensive MRSA colonization surveillance program. It is not intended to diagnose MRSA infection nor to guide or monitor treatment for MRSA infections. Test performance is not FDA approved in patients less than 59 years old. Performed at [facility], [place] [address], [place], [place] [address]   Resp Panel by RT-PCR (Flu A&B, Covid) Nasopharyngeal Swab     Status: None   Collection Time: 09/14/21  6:03 PM   Specimen: Nasopharyngeal Swab; Nasopharyngeal(NP) swabs in vial transport medium  Result Value Ref Range Status   SARS Coronavirus 2 by RT PCR NEGATIVE NEGATIVE Final    Comment: (NOTE) SARS-CoV-2 target nucleic acids are NOT DETECTED.  The SARS-CoV-2 RNA is generally detectable in upper respiratory specimens during the acute phase of infection. The lowest concentration of SARS-CoV-2 viral copies this assay can detect is 138 copies/mL. A negative result does not preclude SARS-Cov-2 infection and should not be used as the sole basis for treatment or other patient management decisions. A negative result may occur with  improper specimen collection/handling, submission of specimen other than nasopharyngeal swab, presence of viral mutation(s) within the areas targeted by this assay, and inadequate number of  viral copies(<138 copies/mL). A negative result must be combined with clinical observations, patient history, and epidemiological information. The expected result is Negative.  Fact Sheet for Patients:  EntrepreneurPulse.com.au  Fact Sheet for Healthcare Providers:  IncredibleEmployment.be  This test is no t yet approved or cleared by the [place] FDA and  has been authorized for detection and/or diagnosis of SARS-CoV-2 by FDA under an Emergency Use Authorization (EUA). This EUA will remain  in effect (meaning this test can be  used) for the duration of the COVID-19 declaration under Section 564(b)(1) of the Act, 21 U.S.C.section 360bbb-3(b)(1), unless the authorization is terminated  or revoked sooner.       Influenza A by PCR NEGATIVE NEGATIVE Final   Influenza B by PCR NEGATIVE NEGATIVE Final    Comment: (NOTE) The Xpert Xpress SARS-CoV-2/FLU/RSV plus assay is intended as an aid in the diagnosis of influenza from Nasopharyngeal swab specimens and should not be used as a sole basis for treatment. Nasal washings and aspirates are unacceptable for Xpert Xpress SARS-CoV-2/FLU/RSV testing.  Fact Sheet for Patients: EntrepreneurPulse.com.au  Fact Sheet for Healthcare Providers: IncredibleEmployment.be  This test is not yet approved or cleared by the [place] FDA and has been authorized for detection and/or diagnosis of SARS-CoV-2 by FDA under an Emergency Use Authorization (EUA). This EUA will remain in effect (meaning this test can be used) for the duration of the COVID-19 declaration under Section 564(b)(1) of the Act, 21 U.S.C. section 360bbb-3(b)(1), unless the authorization is terminated or revoked.  Performed at [facility], [place] [address] [place], [place] [address]      Time coordinating discharge: Over 30 minutes  SIGNED:   Nolberto Hanlon, MD  [facility] 09/16/2021, 5:12 PM Pager   If 7PM-7AM, please contact night-coverage [email or web address] Password [email or web address]

## 2021-09-16 NOTE — TOC Transition Note (Signed)
Transition of Care [facility]) - CM/SW Discharge Note   Patient Details  Name: [name] MRN: [id number] Date of Birth: [birth year]  [facility]) CM/SW Contact:  Milinda Antis, [place] Phone Number: 09/16/2021, 12:14 PM   Clinical Narrative:    [name] Anticipated DC date: 09/16/2021 Family notified: Patient Transport by: Corey Harold   Per MD patient ready for DC to SNF. RN to call report prior to discharge [phone]. RN, patient, and facility notified of DC. Discharge Summary and FL2 sent to facility. DC packet on chart. Ambulance transport requested for patient.   CSW will sign off for now as social work intervention is no longer needed. Please consult [place] again if new needs arise.     Final next level of care: [place] Barriers to Discharge: Barriers Resolved   Patient Goals and CMS Choice Patient states their goals for this hospitalization and ongoing recovery are:: To return to [facility] SN[facility] CMS Medicare.gov Compare Post Acute Care list provided to:: Patient Choice offered to / list presented to : Patient  Discharge Placement              Patient chooses bed at:  [facility]) Patient to be transferred to facility by: [place] Name of family member notified: patient Patient and family notified of of transfer: 09/16/21  Discharge Plan and Services     Post Acute Care Choice: [place]                               Social Determinants of Health (SDOH) Interventions     Readmission Risk Interventions No flowsheet data found.

## 2021-09-16 NOTE — TOC Progression Note (Addendum)
Transition of Care [facility]) - Initial/Assessment Note    Patient Details  Name: [name] MRN: [id number] Date of Birth: 12-May-1951  Transition of Care [facility]) CM/SW Contact:    Milinda Antis, [place] Phone Number: 09/16/2021, 10:06 AM  Clinical Narrative:                 CSW attempted to reach admissions with [name] to inquire about the patient's return with IV abx.  CSW has been unable to get in contact with admissions at the facility .  CSW was informed by the receptionist that the admissions person is out of the office.  CSW was informed that the social worker, Landry Dyke, was covering admissions.  CSW waited on hold for 20 minutes for the facility's and the call was never answered.    TOC leadership notified as [facility] staff has been attempting to get in contact with admissions since the weekend to no avail.   CSW spoke with Dyane Dustman at the direction of [facility] leadership and Ms. Aida Puffer informed CSW that the patient could return to the facility today.  Expected Discharge Plan: [place] Barriers to Discharge: Continued Medical Work up   Patient Goals and CMS Choice Patient states their goals for this hospitalization and ongoing recovery are:: To return to [facility] SN[facility] CMS Medicare.gov Compare Post Acute Care list provided to:: Patient Choice offered to / list presented to : Patient  Expected Discharge Plan and Services Expected Discharge Plan: [place] Choice: [place] Living arrangements for the past 2 months: [place] Expected Discharge Date: 09/19/21                                    Prior Living Arrangements/Services Living arrangements for the past 2 months: [place] Lives with:: Facility Resident Patient language and need for interpreter reviewed:: Yes Do you feel safe going back to the place where you live?: Yes      Need for Family Participation in Patient  Care: Yes (Comment) Care giver support system in place?: No (comment)   Criminal Activity/Legal Involvement Pertinent to Current Situation/Hospitalization: No - Comment as needed  Activities of Daily Living Home Assistive Devices/Equipment: None ADL Screening (condition at time of admission) Patient's cognitive ability adequate to safely complete daily activities?: Yes Is the patient deaf or have difficulty hearing?: No Does the patient have difficulty seeing, even when wearing glasses/contacts?: No Does the patient have difficulty concentrating, remembering, or making decisions?: No Patient able to express need for assistance with ADLs?: Yes Does the patient have difficulty dressing or bathing?: Yes Independently performs ADLs?: No Communication: Independent Dressing (OT): Needs assistance Is this a change from baseline?: Pre-admission baseline Grooming: Needs assistance Is this a change from baseline?: Pre-admission baseline Feeding: Independent Bathing: Dependent Is this a change from baseline?: Pre-admission baseline Toileting: Dependent Is this a change from baseline?: Pre-admission baseline In/Out Bed: Dependent Is this a change from baseline?: Pre-admission baseline Walks in Home: Dependent Is this a change from baseline?: Pre-admission baseline Does the patient have difficulty walking or climbing stairs?: Yes Weakness of Legs: Both Weakness of Arms/Hands: None  Permission Sought/Granted   Permission granted to share information with : Yes, Verbal Permission Granted     Permission granted to share info w AGENCY: [facility]        Emotional Assessment Appearance::  Appears stated age Attitude/Demeanor/Rapport: Engaged Affect (typically observed): Appropriate Orientation: : Oriented to Self, Oriented to Place, Oriented to  Time, Oriented to Situation Alcohol / Substance Use: Not Applicable Psych Involvement: No (comment)  Admission diagnosis:  Complicated UTI  (urinary tract infection) [N39.0] Suprapubic catheter dysfunction, initial encounter ([place]) [T83.010A] Infective urethritis [N34.2] Cellulitis, unspecified cellulitis site [L03.90] UTI (urinary tract infection) [N39.0] Patient Active Problem List   Diagnosis Date Noted   Suprapubic catheter ([place]) 09/12/2021   Acute pyelonephritis 09/12/2021   Chronic combined systolic and diastolic CHF (congestive heart failure) ([place]) 73/53/2992   Complicated UTI (urinary tract infection) 09/11/2021   Cellulitis 09/11/2021   AKI (acute kidney injury) ([place])    Bacteremia due to Klebsiella pneumoniae    UTI (urinary tract infection) 07/23/2021   Dry gangrene ([place]) 04/10/2021   Osteomyelitis ([place]) 04/09/2021   Hiatal hernia with GERD 02/07/2021   Hepatocellular carcinoma ([place]) 02/07/2021   Sepsis secondary to UTI ([place]) 12/08/2020   Spasticity 12/06/2020   Contracture of lower leg joint 12/06/2020   Autonomic dysfunction 08/30/2020   Neurogenic bowel 08/30/2020   Wheelchair dependence 08/30/2020   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   Medication management 06/07/2018   Cough 06/07/2018   Cirrhosis ([place]) 04/20/2018   Obstipation/fecal impaction 02/12/2018   Intractable vomiting 02/11/2018   Chronic hepatitis C without hepatic coma ([place]) 01/10/2018   NICM (nonischemic cardiomyopathy) ([place]) 08/25/2017   Normal coronary arteries 42/68/3419   Diastolic dysfunction 62/22/9798   S/P thoracentesis    Status post thoracentesis    S/p percutaneous right heart catheterization    Pleural effusion on right 06/29/2017   Respiratory failure with hypoxia ([place]) 06/29/2017   Pressure injury of skin 06/29/2017   Shortness of breath    Hypokalemia 09/21/2016   Fever in adult 07/28/2016   Decubitus ulcer of coccyx 02/04/2016   Hyperglycemia 12/03/2015   H/O bilateral mastectomy 07/25/2015   Benign hypertensive heart disease without heart failure 07/15/2015   Hiatal hernia 07/06/2015    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   History of breast cancer 08/21/2014   Adynamic ileus ([place]) 07/22/2014   Elevated liver enzymes 07/22/2014   CHF (congestive heart failure) ([place]) 07/05/2014   Gastroparesis 07/05/2014   Microcytic anemia 01/05/2014   Insomnia 07/14/2013   Edema 05/01/2013   Rheumatoid arthritis ([place]) 02/23/2013   GERD (gastroesophageal reflux disease) 02/23/2013   Essential hypertension 02/23/2013   Neurogenic bladder 02/23/2013   Paraplegia ([place]) 02/23/2013   Depression 02/23/2013   Allergic rhinitis 02/23/2013   Constipation 02/23/2013   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

## 2021-09-17 ENCOUNTER — Ambulatory Visit (HOSPITAL_COMMUNITY)
Admission: RE | Admit: 2021-09-17 | Discharge: 2021-09-17 | Disposition: A | Discharge status: Home / Self Care | Payer: Medicare Other | Source: Ambulatory Visit | Attending: Interventional Radiology | Admitting: Interventional Radiology

## 2021-09-17 ENCOUNTER — Other Ambulatory Visit (HOSPITAL_COMMUNITY): Payer: Self-pay | Admitting: Interventional Radiology

## 2021-09-17 ENCOUNTER — Encounter (HOSPITAL_COMMUNITY): Payer: Self-pay

## 2021-09-17 ENCOUNTER — Other Ambulatory Visit: Payer: Self-pay

## 2021-09-17 DIAGNOSIS — N319 Neuromuscular dysfunction of bladder, unspecified: Secondary | ICD-10-CM

## 2021-09-17 HISTORY — PX: IR PATIENT EVAL TECH 0-60 MINS: IMG5564

## 2021-09-17 NOTE — Progress Notes (Signed)
ID Brief Note  Hepatitis C RNA quantitative Latest Ref Rng & Units 09/16/2021 04/24/2019 07/18/2018 04/20/2018 03/21/2018  HCV Quantitative >50 IU/mL HCV Not Detected - - - -  HCV Quantitative Log NOT DETECT Log IU/mL - <1.18 NOT DETECTED <1.18 NOT DETECTED <1.18 NOT DETECTED <1.18 NOT DETECTED    Rosiland Oz, MD Infectious Disease Physician [facility] [address] [place], [place] [address] Phone: [phone]   Fax: [phone]

## 2021-09-17 NOTE — Procedures (Signed)
Patients name and DOB checked. Patient came in for upsize bud had an upsize a couple days prior as an inpatient, she had some questions so Gareth Eagle PA came in to chat with her. She had no further questions.    Kili Gracy RT (R)

## 2021-09-17 NOTE — Progress Notes (Signed)
Patients name and DOB checked. Patient came in for upsize bud had an upsize a couple days prior as an inpatient, she had some questions so Gareth Eagle PA came in to chat with her. She had no further questions.    Rawlins Stuard RT(R)

## 2021-09-17 NOTE — Progress Notes (Signed)
°  Patient seen in IR today for scheduled upsize of SP catheter.  She actually just had this done a few days ago while inpatient.  She did c/o waking up with a wet brief this morning.   Upon exam, her dressing around the SP tube is dry. The tubing actually had a pro-lock cap on it.   She states the cap was put on so that she could do things without having to care her urine bag.   I explained to her that urine would flow much easier into the bag without the cap on it and to ONLY use the cap without the bag.   This was also demonstrated for her.  She understands.   Kaetlin Bullen S Kyndall Chaplin PA-C 09/17/2021 11:20 AM
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
## 2021-09-22 ENCOUNTER — Other Ambulatory Visit: Payer: Self-pay | Admitting: Interventional Radiology

## 2021-09-22 ENCOUNTER — Ambulatory Visit: Payer: Medicare Other | Admitting: Physical Medicine and Rehabilitation

## 2021-09-22 DIAGNOSIS — R160 Hepatomegaly, not elsewhere classified: Secondary | ICD-10-CM

## 2021-09-22 NOTE — Progress Notes (Signed)
ok 

## 2021-09-23 ENCOUNTER — Ambulatory Visit (INDEPENDENT_AMBULATORY_CARE_PROVIDER_SITE_OTHER): Payer: Medicare Other | Admitting: Cardiology

## 2021-09-23 ENCOUNTER — Encounter: Payer: Self-pay | Admitting: Cardiology

## 2021-09-23 ENCOUNTER — Other Ambulatory Visit: Payer: Self-pay

## 2021-09-23 VITALS — BP 122/78 | HR 86 | Ht 65.0 in

## 2021-09-23 DIAGNOSIS — I10 Essential (primary) hypertension: Secondary | ICD-10-CM | POA: Diagnosis not present

## 2021-09-23 MED ORDER — CARVEDILOL 12.5 MG PO TABS: 12.5000 mg | ORAL_TABLET | Freq: Two times a day (BID) | ORAL | 3 refills | Status: AC

## 2021-09-23 MED ORDER — LOSARTAN POTASSIUM 25 MG PO TABS: 25.0000 mg | ORAL_TABLET | Freq: Every day | ORAL | 3 refills | Status: DC

## 2021-09-23 NOTE — Patient Instructions (Addendum)
Medication Instructions:  RESTART the Losartan 25 mg once daily  CARVEDILOL: Take 12.5 mg two times daily. Hold for a systolic less than 211.   *If you need a refill on your cardiac medications before your next appointment, please call your pharmacy*   Lab Work: Your provider would like for you to return in 10 days to have the following labs drawn: BMET. You do not need an appointment for the lab. Once in our office lobby there is a podium where you can sign in and ring the doorbell to alert [place] that you are here. The lab is open from 8:00 am to 4:30 pm; closed for lunch from 12:45pm-1:45pm.  If you have labs (blood work) drawn today and your tests are completely normal, you will receive your results only by: [facility] (if you have MyChart) OR A paper copy in the mail If you have any lab test that is abnormal or we need to change your treatment, we will call you to review the results.   Testing/Procedures: None ordered   Follow-Up: At [facility], you and your health needs are our priority.  As part of our continuing mission to provide you with exceptional heart care, we have created designated Provider Care Teams.  These Care Teams include your primary Cardiologist (physician) and Advanced Practice Providers (APPs -  Physician Assistants and Nurse Practitioners) who all work together to provide you with the care you need, when you need it.  We recommend signing up for the patient portal called "MyChart".  Sign up information is provided on this After Visit Summary.  MyChart is used to connect with patients for Virtual Visits (Telemedicine).  Patients are able to view lab/test results, encounter notes, upcoming appointments, etc.  Non-urgent messages can be sent to your provider as well.   To learn more about what you can do with MyChart, go to [email or web address].    Your next appointment:   12 month(s)  The format for your next appointment:   In Person  Provider:    Minus Breeding, MD {

## 2021-10-02 ENCOUNTER — Encounter: Payer: Medicare Other | Attending: Physical Medicine and Rehabilitation | Admitting: Physical Medicine & Rehabilitation

## 2021-10-02 ENCOUNTER — Encounter: Payer: Self-pay | Admitting: Physical Medicine & Rehabilitation

## 2021-10-02 ENCOUNTER — Other Ambulatory Visit: Payer: Self-pay

## 2021-10-02 VITALS — BP 124/69 | HR 81 | Temp 99.3°F | Ht 65.0 in

## 2021-10-02 DIAGNOSIS — G8221 Paraplegia, complete: Secondary | ICD-10-CM

## 2021-10-02 NOTE — Patient Instructions (Signed)

## 2021-10-02 NOTE — Progress Notes (Signed)
Botox Injection for spasticity using needle [name] guidance ? ?Dilution: 50 Units/ml ?Indication: Severe spasticity which interferes with ADL,mobility and/or  hygiene and is unresponsive to medication management and other conservative care ?Informed consent was obtained after describing risks and benefits of the procedure with the patient. This includes bleeding, bruising, infection, excessive weakness, or medication side effects. A REMS form is on file and signed. ?Needle: 25g 2" needle electrode ?Number of units per muscle ?Right Add Long 50 ?Right Add Mag  100 ?Left Add Long 50 ?Left Add Mag  100 ?All injections were done after obtaining appropriate [name] activity and after negative drawback for blood. The patient tolerated the procedure well. Post procedure instructions were given. A followup appointment was made.   ? ?Spasticity minimal on left will inject Right side only next visit should be able to reduce to 100U  ?Right add long 50 ?Right add mag 50 ?

## 2021-10-03 ENCOUNTER — Other Ambulatory Visit: Payer: Self-pay

## 2021-10-03 ENCOUNTER — Encounter (HOSPITAL_COMMUNITY): Payer: Self-pay

## 2021-10-03 ENCOUNTER — Emergency Department (HOSPITAL_COMMUNITY)
Admission: EM | Admit: 2021-10-03 | Discharge: 2021-10-03 | Payer: Medicare Other | Attending: Emergency Medicine | Admitting: Emergency Medicine

## 2021-10-03 DIAGNOSIS — Z79899 Other long term (current) drug therapy: Secondary | ICD-10-CM | POA: Insufficient documentation

## 2021-10-03 DIAGNOSIS — R310 Gross hematuria: Secondary | ICD-10-CM | POA: Insufficient documentation

## 2021-10-03 DIAGNOSIS — R319 Hematuria, unspecified: Secondary | ICD-10-CM | POA: Diagnosis present

## 2021-10-03 DIAGNOSIS — Z7982 Long term (current) use of aspirin: Secondary | ICD-10-CM | POA: Diagnosis not present

## 2021-10-03 LAB — URINALYSIS, ROUTINE W REFLEX MICROSCOPIC
Bilirubin Urine: NEGATIVE
Glucose, UA: NEGATIVE mg/dL
Ketones, ur: NEGATIVE mg/dL
Nitrite: POSITIVE — AB
Protein, ur: 100 mg/dL — AB
RBC / HPF: >50 RBC/hpf — ABNORMAL HIGH (ref 0–5)
Specific Gravity, Urine: 1.019 (ref 1.005–1.030)
WBC, UA: >50 WBC/hpf — ABNORMAL HIGH (ref 0–5)
pH: 5 (ref 5.0–8.0)

## 2021-10-03 LAB — CBC WITH DIFFERENTIAL/PLATELET
Abs Immature Granulocytes: 0.04 10*3/uL (ref 0.00–0.07)
Basophils Absolute: 0 10*3/uL (ref 0.0–0.1)
Basophils Relative: 0 %
Eosinophils Absolute: 0.5 10*3/uL (ref 0.0–0.5)
Eosinophils Relative: 5 %
HCT: 38.8 % (ref 36.0–46.0)
Hemoglobin: 12.3 g/dL (ref 12.0–15.0)
Immature Granulocytes: 0 %
Lymphocytes Relative: 20 %
Lymphs Abs: 2 10*3/uL (ref 0.7–4.0)
MCH: 25.5 pg — ABNORMAL LOW (ref 26.0–34.0)
MCHC: 31.7 g/dL (ref 30.0–36.0)
MCV: 80.3 fL (ref 80.0–100.0)
Monocytes Absolute: 0.6 10*3/uL (ref 0.1–1.0)
Monocytes Relative: 5 %
Neutro Abs: 7.1 10*3/uL (ref 1.7–7.7)
Neutrophils Relative %: 70 %
Platelets: 274 10*3/uL (ref 150–400)
RBC: 4.83 MIL/uL (ref 3.87–5.11)
RDW: 18.1 % — ABNORMAL HIGH (ref 11.5–15.5)
WBC: 10.2 10*3/uL (ref 4.0–10.5)
nRBC: 0 % (ref 0.0–0.2)

## 2021-10-03 LAB — BASIC METABOLIC PANEL
Anion gap: 9 (ref 5–15)
BUN: 19 mg/dL (ref 8–23)
CO2: 23 mmol/L (ref 22–32)
Calcium: 9.4 mg/dL (ref 8.9–10.3)
Chloride: 103 mmol/L (ref 98–111)
Creatinine, Ser: 0.68 mg/dL (ref 0.44–1.00)
GFR, Estimated: >60 mL/min (ref >60)
Glucose, Bld: 103 mg/dL — ABNORMAL HIGH (ref 70–99)
Potassium: 5.4 mmol/L — ABNORMAL HIGH (ref 3.5–5.1)
Sodium: 135 mmol/L (ref 135–145)

## 2021-10-03 NOTE — ED Notes (Signed)
Attempted to call [facility] facility to give report. No answer. ?

## 2021-10-03 NOTE — ED Provider Notes (Signed)
[facility] DEPT Provider Note   CSN: [id number] Arrival date & time: 10/03/21  0036     History  Chief Complaint  Patient presents with   Hematuria    [name] is a 71 y.o. female.  The history is provided by the patient and medical records.  Hematuria [name] is a 71 y.o. female who presents to the Emergency Department complaining of bleeding from her catheter site.  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  Her catheter was placed on February 7.  She was then admitted to the hospital on February 16 through 21 for infection in her urine as well as cellulitis of the abdominal wall.  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  She did have a larger catheter placed by IR during her hospitalization.   Tonight she noticed that there was blood in her urine/tubing as well as a small amount of around the catheter region.  She states that she is concerned she is not getting proper care of the catheter at her facility.  She has not had dressing changes to the area.  She is currently residing at [facility]   No fever.  No N/V. No blood thinners.  No bleeding issues.  No pain.  No feeling T7 down.      Home Medications Prior to Admission medications   Medication Sig Start Date End Date Taking? Authorizing Provider  acetaminophen (TYLENOL) 325 MG tablet Take 650 mg by mouth every 6 (six) hours as needed for mild pain or fever.    [provider]  albuterol (VENTOLIN HFA) 108 (90 Base) MCG/ACT inhaler Inhale 2 puffs into the lungs every 4 (four) hours as needed for wheezing or shortness of breath.    [provider]  alum & mag hydroxide-simeth (MAALOX PLUS) 400-400-40 MG/5ML suspension Take 10 mLs by mouth every 6 (six) hours as needed for indigestion, heartburn or flatulence (nausea).    [provider]  Amino  Acids-Protein Hydrolys (FEEDING SUPPLEMENT, PRO-STAT 64,) LIQD Take 30 mLs by mouth 2 (two) times daily. For wound healing Patient not taking: Reported on 10/02/2021    [provider]  aspirin EC 81 MG tablet Take 81 mg by mouth daily. Swallow whole.    [provider]  atorvastatin (LIPITOR) 40 MG tablet Take 1 tablet (40 mg total) by mouth daily. 07/28/21 09/23/21  Orvis Brill, MD  baclofen (LIORESAL) 10 MG tablet Take 1 tablet (10 mg total) by mouth 3 (three) times daily. 09/16/21   Nolberto Hanlon, MD  Benzocaine 10 MG LOZG Use as directed 1 lozenge in the mouth or throat every 2 (two) hours as needed (sore throat).    [provider]  bisacodyl (DULCOLAX) 10 MG suppository Place 1 suppository (10 mg total) rectally every Tuesday, Thursday, Saturday, and Sunday at 6 PM. 02/15/18   Denton Brick, Courage, MD  Brimonidine Tartrate (LUMIFY) 0.025 % SOLN Place 1 drop into both eyes daily as needed (redness). Patient not taking: Reported on 10/02/2021    [provider]  calcium-vitamin D (OSCAL WITH D) 500-200 MG-UNIT tablet Take 1 tablet by mouth 2 (two) times daily.  Patient not taking: Reported on 10/02/2021    [provider]  carboxymethylcellulose 1 % ophthalmic solution Apply 1 drop to eye every 6 (six) hours as needed (comfort). Patient not taking: Reported on 10/02/2021    [provider]  Carboxymethylcellulose  Sod PF 1 % GEL Place 1 drop into both eyes in the morning, at noon, in the evening, and at bedtime.    [provider]  carvedilol (COREG) 12.5 MG tablet Take 1 tablet (12.5 mg total) by mouth 2 (two) times daily with a meal. Hold for SBP <100 09/23/21   Minus Breeding, MD  cetirizine (ZYRTEC) 10 MG tablet Take 10 mg by mouth daily as needed for allergies.     [provider]  cholecalciferol (VITAMIN D) 1000 units tablet Take 1,000 Units by mouth daily.    [provider]  Cranberry 450 MG TABS Take 450 mg by mouth  daily.    [provider]  desonide (DESOWEN) 0.05 % ointment Apply 1 application topically 2 (two) times daily. Apply to lower lip for dryness Patient not taking: Reported on 10/02/2021 02/03/21   [provider]  Dextromethorphan-guaiFENesin 10-100 MG/5ML liquid Take 15 mLs by mouth every 4 (four) hours as needed. Patient not taking: Reported on 10/02/2021 04/14/21   Sanjuan Dame, MD  diclofenac Sodium (VOLTAREN) 1 % GEL Apply 2 g topically 3 (three) times daily. Apply to bilateral shoulders 07/30/21   [provider]  docusate sodium (COLACE) 100 MG capsule Take 1 capsule (100 mg total) by mouth 3 (three) times daily as needed for mild constipation. 09/16/21   Nolberto Hanlon, MD  ferrous gluconate (FERGON) 324 MG tablet Take 1 tablet (324 mg total) by mouth daily. 07/28/21   Orvis Brill, MD  hydrocortisone 2.5 % lotion Apply 1 application topically every 12 (twelve) hours as needed (itching of arms and legs). Patient not taking: Reported on 10/02/2021    [provider]  hydroquinone 2 % cream Apply 1 application topically 2 (two) times daily. To dark spots Patient not taking: Reported on 10/02/2021    [provider]  hydroxychloroquine (PLAQUENIL) 200 MG tablet Take 200 mg by mouth daily.     [provider]  LINZESS 290 MCG CAPS capsule Take 290 mcg by mouth daily. Patient not taking: Reported on 10/02/2021 07/18/21   [provider]  losartan (COZAAR) 25 MG tablet Take 1 tablet (25 mg total) by mouth daily. Patient not taking: Reported on 10/02/2021 09/23/21 12/22/21  Minus Breeding, MD  melatonin 3 MG TABS tablet Take 6 mg by mouth at bedtime as needed (sleep).    [provider]  Multiple Vitamin (MULTIVITAMIN WITH MINERALS) TABS tablet Take 1 tablet by mouth daily.    [provider]  nutrition supplement, JUVEN, (JUVEN) PACK Take 1 packet by mouth 2 (two) times daily between meals. Patient not taking: Reported on  10/02/2021    [provider]  omeprazole (PRILOSEC) 20 MG capsule Take 20 mg by mouth 2 (two) times daily.    [provider]  ondansetron (ZOFRAN) 4 MG tablet Take 4 mg by mouth every 8 (eight) hours as needed for nausea or vomiting.    [provider]  oxybutynin (DITROPAN-XL) 5 MG 24 hr tablet Take 5 mg by mouth daily. 12/04/20   [provider]  pantoprazole (PROTONIX) 40 MG tablet Take 1 tablet (40 mg total) by mouth 2 (two) times daily. Patient not taking: Reported on 09/11/2021 04/14/21 09/02/21  Sanjuan Dame, MD  polyethylene glycol [facility] Floria Raveling) packet Take 17 g by mouth every Monday, Wednesday, Friday, and Saturday at 6 PM. Patient taking differently: Take 17 g by mouth See admin instructions. In the evening on Monday,Wednesday,Friday and saturday 02/14/18   Emokpae,  Courage, MD  Prenatal Vit-Fe Fumarate-FA (PRENATAL MULTIVITAMIN) TABS tablet Take 1 tablet by mouth daily at 12 noon. chewable    [provider]  Propylene Glycol (SYSTANE COMPLETE OP) Place 2 drops into both eyes in the morning, at noon, in the evening, and at bedtime. Patient not taking: Reported on 10/02/2021    [provider]  sennosides-docusate sodium (SENOKOT-S) 8.6-50 MG tablet Take 2 tablets by mouth 2 (two) times daily. Patient taking differently: Take 2 tablets by mouth in the morning and at bedtime. 02/14/18   Roxan Hockey, MD  sertraline (ZOLOFT) 50 MG tablet Take 1 tablet (50 mg total) by mouth daily. 09/16/21   Nolberto Hanlon, MD  Simethicone 125 MG TABS Take 125 mg by mouth every 8 (eight) hours as needed (gas pain).    [provider]  sucralfate (CARAFATE) 1 GM/10ML suspension Take 1 g by mouth every 6 (six) hours as needed (gerd).    [provider]  tiZANidine (ZANAFLEX) 4 MG tablet Take 4 mg by mouth 3 (three) times daily.    [provider]  triamcinolone cream (KENALOG) 0.1 % Apply topically. 08/06/21   [provider]  Zinc Oxide (TRIPLE PASTE) 12.8 % ointment Apply 1 application topically as needed for irritation. 04/14/21   Sanjuan Dame, MD  zinc sulfate 220 (50 Zn) MG capsule Take 220 mg by mouth daily.    [provider]      Allergies    Patient has no known allergies.    Review of Systems   Review of Systems  Genitourinary:  Positive for hematuria.  All other systems reviewed and are negative.  Physical Exam Updated Vital Signs BP (!) 152/83    Pulse 79    Temp 97.9 F (36.6 C) (Oral)    Resp 14    Ht 5' 5"  (1.651 m)    Wt 70.8 kg    SpO2 97%    BMI 25.96 kg/m  Physical Exam Vitals and nursing note reviewed.  Constitutional:      Appearance: She is well-developed.  HENT:     Head: Normocephalic and atraumatic.  Cardiovascular:     Rate and Rhythm: Normal rate and regular rhythm.  Pulmonary:     Effort: Pulmonary effort is normal. No respiratory distress.  Abdominal:     Palpations: Abdomen is soft.     Tenderness: There is no abdominal tenderness. There is no guarding or rebound.     Comments: Suprapubic catheter in place.  Small amount of blood at stoma without significant erythema or edema.  There is urine in the catheter with small amount of blood mixed in.    Musculoskeletal:        General: No tenderness.  Skin:    General: Skin is warm and dry.  Neurological:     Mental Status: She is alert and oriented to person, place, and time.  Psychiatric:        Behavior: Behavior normal.    ED Results / Procedures / Treatments   Labs (all labs ordered are listed, but only abnormal results are displayed) Labs Reviewed  BASIC METABOLIC PANEL - Abnormal; Notable for the following components:      Result Value   Potassium 5.4 (*)    Glucose, Bld 103 (*)    All other components within normal limits  CBC WITH DIFFERENTIAL/PLATELET - Abnormal; Notable for the following components:   MCH 25.5 (*)    RDW 18.1 (*)    All other  components within normal  limits  URINALYSIS, ROUTINE W REFLEX MICROSCOPIC - Abnormal; Notable for the following components:   APPearance CLOUDY (*)    Hgb urine dipstick LARGE (*)    Protein, ur 100 (*)    Nitrite POSITIVE (*)    Leukocytes,Ua LARGE (*)    RBC / HPF >50 (*)    WBC, UA >50 (*)    Bacteria, UA FEW (*)    All other components within normal limits    EKG None  Radiology No results found.  Procedures Procedures    Medications Ordered in ED Medications - No data to display  ED Course/ Medical Decision Making/ A&P                           Medical Decision Making Amount and/or Complexity of Data Reviewed Labs: ordered.   Patient with history of recurrent UTIs with resistant organisms here for evaluation of blood at her catheter site that was placed on February 7 as well as some blood in the catheter tubing.  She does not have sensation in her abdomen.  No systemic complaints.  On evaluation she does have some trace blood at her catheter site without evidence of soft tissue infection.  The catheter appears to be well positioned and draining without difficulty.  There is trace blood in the catheter tubing but overall her urine is golden in color.  One of her abdominal wall sutures may have become displaced.  Suspect that there could have been some trauma resulting in the hematuria.  Given that she is draining well do not feel she needs additional work-up.  Her labs are near her baseline.  Urine is difficult to interpret due to her chronic catheter.  No systemic symptoms concerning for acute infectious process.  Will not pursue cultures or further antimicrobial treatment at this time.  Plan to discharge back to facility with reinforcement that patient needs local care to the catheter site and attention when repositioning the patient to assure that the catheter does not get pulled on.        Final Clinical Impression(s) / ED Diagnoses Final diagnoses:  Gross hematuria    Rx / DC  Orders ED Discharge Orders     None         Quintella Reichert, MD 10/03/21 [phone]

## 2021-10-03 NOTE — Discharge Instructions (Signed)
It is very important that your catheter site is well cared for.  Please make sure that the area is cleaned with soap and water once daily and a clean dressing such as gauze is applied.  If the dressing gets soiled it needs to be exchanged sooner. ?It is also important to make sure that the catheter does not get pulled up on when you are moving around and getting repositioned.  After you are repositioned the catheter should be looked at to make sure that it is not kinked off. ?Get rechecked if you have uncontrolled bleeding, pain, fevers or new concerning symptoms. ?

## 2021-10-03 NOTE — ED Notes (Signed)
[facility] called for transport.  

## 2021-10-03 NOTE — ED Triage Notes (Signed)
Patient BIB [facility] from [facility] Had a suprapubic catheter put in 2/7. Couple weeks later, infected, diagnosed with UTI. They put a larger catheter in. Another 2 weeks later, now patient noticing blood in her urine. At the facility, they are not "monitoring" the catheter. The gauze around catheter had blood on it. Looked like brownish drainage per patient. Patient is paraplegic.  ?

## 2021-10-09 ENCOUNTER — Other Ambulatory Visit (HOSPITAL_COMMUNITY): Payer: Self-pay | Admitting: Adult Health

## 2021-10-09 DIAGNOSIS — R160 Hepatomegaly, not elsewhere classified: Secondary | ICD-10-CM

## 2021-10-10 ENCOUNTER — Emergency Department (HOSPITAL_COMMUNITY)
Admission: EM | Admit: 2021-10-10 | Discharge: 2021-10-11 | Disposition: A | Discharge status: Skilled Nursing Facility | Payer: Medicare Other | Attending: Emergency Medicine | Admitting: Emergency Medicine

## 2021-10-10 ENCOUNTER — Other Ambulatory Visit: Payer: Self-pay

## 2021-10-10 ENCOUNTER — Emergency Department (HOSPITAL_COMMUNITY): Payer: Medicare Other

## 2021-10-10 ENCOUNTER — Encounter (HOSPITAL_COMMUNITY): Payer: Self-pay | Admitting: Emergency Medicine

## 2021-10-10 DIAGNOSIS — Y829 Unspecified medical devices associated with adverse incidents: Secondary | ICD-10-CM | POA: Insufficient documentation

## 2021-10-10 DIAGNOSIS — T83091A Other mechanical complication of indwelling urethral catheter, initial encounter: Secondary | ICD-10-CM | POA: Diagnosis not present

## 2021-10-10 DIAGNOSIS — N3001 Acute cystitis with hematuria: Secondary | ICD-10-CM

## 2021-10-10 DIAGNOSIS — Z853 Personal history of malignant neoplasm of breast: Secondary | ICD-10-CM | POA: Diagnosis not present

## 2021-10-10 DIAGNOSIS — R319 Hematuria, unspecified: Secondary | ICD-10-CM | POA: Diagnosis present

## 2021-10-10 DIAGNOSIS — Z79899 Other long term (current) drug therapy: Secondary | ICD-10-CM | POA: Diagnosis not present

## 2021-10-10 DIAGNOSIS — I10 Essential (primary) hypertension: Secondary | ICD-10-CM | POA: Insufficient documentation

## 2021-10-10 LAB — URINALYSIS, ROUTINE W REFLEX MICROSCOPIC
Glucose, UA: NEGATIVE mg/dL
Ketones, ur: 5 mg/dL — AB
Nitrite: POSITIVE — AB
Protein, ur: >=300 mg/dL — AB
RBC / HPF: >50 RBC/hpf — ABNORMAL HIGH (ref 0–5)
Specific Gravity, Urine: 1.02 (ref 1.005–1.030)
WBC, UA: >50 WBC/hpf — ABNORMAL HIGH (ref 0–5)
pH: 8 (ref 5.0–8.0)

## 2021-10-10 LAB — COMPREHENSIVE METABOLIC PANEL
ALT: 21 U/L (ref 0–44)
AST: 16 U/L (ref 15–41)
Albumin: 3.3 g/dL — ABNORMAL LOW (ref 3.5–5.0)
Alkaline Phosphatase: 79 U/L (ref 38–126)
Anion gap: 9 (ref 5–15)
BUN: 17 mg/dL (ref 8–23)
CO2: 24 mmol/L (ref 22–32)
Calcium: 9.2 mg/dL (ref 8.9–10.3)
Chloride: 103 mmol/L (ref 98–111)
Creatinine, Ser: 0.74 mg/dL (ref 0.44–1.00)
GFR, Estimated: >60 mL/min (ref >60)
Glucose, Bld: 121 mg/dL — ABNORMAL HIGH (ref 70–99)
Potassium: 3.3 mmol/L — ABNORMAL LOW (ref 3.5–5.1)
Sodium: 136 mmol/L (ref 135–145)
Total Bilirubin: 0.3 mg/dL (ref 0.3–1.2)
Total Protein: 7.8 g/dL (ref 6.5–8.1)

## 2021-10-10 LAB — CBC WITH DIFFERENTIAL/PLATELET
Abs Immature Granulocytes: 0.04 10*3/uL (ref 0.00–0.07)
Basophils Absolute: 0.1 10*3/uL (ref 0.0–0.1)
Basophils Relative: 1 %
Eosinophils Absolute: 1 10*3/uL — ABNORMAL HIGH (ref 0.0–0.5)
Eosinophils Relative: 9 %
HCT: 36.8 % (ref 36.0–46.0)
Hemoglobin: 11.4 g/dL — ABNORMAL LOW (ref 12.0–15.0)
Immature Granulocytes: 0 %
Lymphocytes Relative: 12 %
Lymphs Abs: 1.3 10*3/uL (ref 0.7–4.0)
MCH: 25.2 pg — ABNORMAL LOW (ref 26.0–34.0)
MCHC: 31 g/dL (ref 30.0–36.0)
MCV: 81.4 fL (ref 80.0–100.0)
Monocytes Absolute: 0.5 10*3/uL (ref 0.1–1.0)
Monocytes Relative: 5 %
Neutro Abs: 7.9 10*3/uL — ABNORMAL HIGH (ref 1.7–7.7)
Neutrophils Relative %: 73 %
Platelets: 263 10*3/uL (ref 150–400)
RBC: 4.52 MIL/uL (ref 3.87–5.11)
RDW: 17.4 % — ABNORMAL HIGH (ref 11.5–15.5)
WBC: 10.7 10*3/uL — ABNORMAL HIGH (ref 4.0–10.5)
nRBC: 0 % (ref 0.0–0.2)

## 2021-10-10 MED ORDER — IOHEXOL 300 MG/ML  SOLN
100.0000 mL | Freq: Once | INTRAMUSCULAR | Status: AC | PRN
  Administered 2021-10-10: 100 mL via INTRAVENOUS

## 2021-10-10 MED ORDER — SODIUM CHLORIDE 0.9 % IV BOLUS
500.0000 mL | Freq: Once | INTRAVENOUS | Status: AC
  Administered 2021-10-10: 500 mL via INTRAVENOUS

## 2021-10-10 MED ORDER — POTASSIUM CHLORIDE CRYS ER 20 MEQ PO TBCR
40.0000 meq | EXTENDED_RELEASE_TABLET | Freq: Once | ORAL | Status: AC
  Administered 2021-10-10: 40 meq via ORAL
  Filled 2021-10-10: qty 2

## 2021-10-10 MED ORDER — DIATRIZOATE MEGLUMINE & SODIUM 66-10 % PO SOLN
10.0000 mL | Freq: Once | ORAL | Status: AC
  Administered 2021-10-11: 10 mL via ORAL
  Filled 2021-10-10: qty 30

## 2021-10-10 MED ORDER — SODIUM CHLORIDE 0.9 % IV SOLN: 1.0000 g | Freq: Once | INTRAVENOUS | Status: DC

## 2021-10-10 MED ORDER — SODIUM CHLORIDE 0.9 % IV SOLN
2.0000 g | Freq: Once | INTRAVENOUS | Status: AC
  Administered 2021-10-10: 2 g via INTRAVENOUS
  Filled 2021-10-10: qty 20
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
## 2021-10-10 NOTE — Discharge Instructions (Addendum)
Your work-up today showed a questionable urinary tract infection.  Treated for this today.  Please make sure to follow-up with Dr. Wynelle Cleveland within the next few days.  In terms of the wound on your left lateral leg, this does not look infected.  Please have the facility continue cleaning it daily and putting dressings over it.  Please follow-up with your primary care doctor about this.  Please return to the ER for any new or worsening symptoms. ?

## 2021-10-10 NOTE — ED Triage Notes (Signed)
Pt to ER via EMS for [facility] with reports of hematuria from a suprapubic catheter.  Pt reports this has happened before and that she does not think it has been right since it was placed earlier this year.   ?

## 2021-10-10 NOTE — ED Notes (Signed)
[facility] called and patient is number 10 on the list ?
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
## 2021-10-10 NOTE — ED Notes (Addendum)
Patients bed linen,brief, and gown were changed. Patient was given a [place] sandwich and a soda. ?

## 2021-10-11 ENCOUNTER — Emergency Department (HOSPITAL_COMMUNITY): Payer: Medicare Other

## 2021-10-11 ENCOUNTER — Encounter (HOSPITAL_COMMUNITY): Payer: Self-pay

## 2021-10-11 ENCOUNTER — Other Ambulatory Visit: Payer: Self-pay

## 2021-10-11 ENCOUNTER — Emergency Department (HOSPITAL_COMMUNITY)
Admission: EM | Admit: 2021-10-11 | Discharge: 2021-10-11 | Disposition: A | Discharge status: Skilled Nursing Facility | Payer: Medicare Other | Source: Home / Self Care | Attending: Emergency Medicine | Admitting: Emergency Medicine

## 2021-10-11 DIAGNOSIS — T83091A Other mechanical complication of indwelling urethral catheter, initial encounter: Secondary | ICD-10-CM | POA: Insufficient documentation

## 2021-10-11 DIAGNOSIS — Y829 Unspecified medical devices associated with adverse incidents: Secondary | ICD-10-CM | POA: Insufficient documentation

## 2021-10-11 DIAGNOSIS — N3001 Acute cystitis with hematuria: Secondary | ICD-10-CM | POA: Diagnosis not present

## 2021-10-11 DIAGNOSIS — Z435 Encounter for attention to cystostomy: Secondary | ICD-10-CM

## 2021-10-11 LAB — BASIC METABOLIC PANEL
Anion gap: 12 (ref 5–15)
BUN: 13 mg/dL (ref 8–23)
CO2: 19 mmol/L — ABNORMAL LOW (ref 22–32)
Calcium: 9.6 mg/dL (ref 8.9–10.3)
Chloride: 107 mmol/L (ref 98–111)
Creatinine, Ser: 0.56 mg/dL (ref 0.44–1.00)
GFR, Estimated: >60 mL/min (ref >60)
Glucose, Bld: 90 mg/dL (ref 70–99)
Potassium: 3.8 mmol/L (ref 3.5–5.1)
Sodium: 138 mmol/L (ref 135–145)

## 2021-10-11 LAB — CBC WITH DIFFERENTIAL/PLATELET
Abs Immature Granulocytes: 0.05 10*3/uL (ref 0.00–0.07)
Basophils Absolute: 0.1 10*3/uL (ref 0.0–0.1)
Basophils Relative: 1 %
Eosinophils Absolute: 1.8 10*3/uL — ABNORMAL HIGH (ref 0.0–0.5)
Eosinophils Relative: 18 %
HCT: 40.1 % (ref 36.0–46.0)
Hemoglobin: 12.4 g/dL (ref 12.0–15.0)
Immature Granulocytes: 1 %
Lymphocytes Relative: 20 %
Lymphs Abs: 1.9 10*3/uL (ref 0.7–4.0)
MCH: 25.1 pg — ABNORMAL LOW (ref 26.0–34.0)
MCHC: 30.9 g/dL (ref 30.0–36.0)
MCV: 81.2 fL (ref 80.0–100.0)
Monocytes Absolute: 0.6 10*3/uL (ref 0.1–1.0)
Monocytes Relative: 6 %
Neutro Abs: 5.1 10*3/uL (ref 1.7–7.7)
Neutrophils Relative %: 54 %
Platelets: 270 10*3/uL (ref 150–400)
RBC: 4.94 MIL/uL (ref 3.87–5.11)
RDW: 17.6 % — ABNORMAL HIGH (ref 11.5–15.5)
WBC: 9.5 10*3/uL (ref 4.0–10.5)
nRBC: 0 % (ref 0.0–0.2)

## 2021-10-11 MED ORDER — DIATRIZOATE MEGLUMINE & SODIUM 66-10 % PO SOLN
10.0000 mL | Freq: Once | ORAL | Status: AC
  Administered 2021-10-11: 10 mL

## 2021-10-11 NOTE — ED Notes (Signed)
Report to [facility] attempted x2. Message left at answering machine. ?

## 2021-10-11 NOTE — ED Notes (Signed)
[facility] called for transport. Currently #4 on their list. ?

## 2021-10-11 NOTE — ED Provider Notes (Signed)
?[place] DEPT ?Provider Note ? ? ?CSN: [id number] ?Arrival date & time: 10/11/21  1228 ? ?  ? ?History ? ?Chief Complaint  ?Patient presents with  ? Urinary Catheter Problem  ? ? ?[name] is a 71 y.o. female. ? ?71 year old female with prior medical history detailed below presents for evaluation. ? ?Patient was most recently seen by in this ED for placement of suprapubic catheter. ?Patient apparently was discharged with suprapubic catheter not attached to leg bag. ? ?10 cc syringe used for placement of contrast into the bladder was still attached to suprapubic catheter. ? ?Patient otherwise denied complaint. ? ?Her facility was concerned about the unattached bag and sent her back to the ED for reevaluation. ? ? ?The history is provided by the patient and medical records.  ? ?  ? ?Home Medications ?Prior to Admission medications   ?Medication Sig Start Date End Date Taking? Authorizing Provider  ?acetaminophen (TYLENOL) 325 MG tablet Take 650 mg by mouth every 6 (six) hours as needed for mild pain or fever.    [provider]  ?albuterol (VENTOLIN HFA) 108 (90 Base) MCG/ACT inhaler Inhale 2 puffs into the lungs every 4 (four) hours as needed for wheezing or shortness of breath.    [provider]  ?alum & mag hydroxide-simeth (MAALOX PLUS) 400-400-40 MG/5ML suspension Take 10 mLs by mouth every 6 (six) hours as needed for indigestion, heartburn or flatulence (nausea).    [provider]  ?Amino Acids-Protein Hydrolys (FEEDING SUPPLEMENT, PRO-STAT 64,) LIQD Take 30 mLs by mouth 2 (two) times daily. For wound healing    [provider]  ?aspirin EC 81 MG tablet Take 81 mg by mouth daily. Swallow whole.    [provider]  ?atorvastatin (LIPITOR) 40 MG tablet Take 1 tablet (40 mg total) by mouth daily. 07/28/21 11/29/21  Orvis Brill, MD  ?baclofen (LIORESAL) 10 MG tablet Take 1 tablet (10 mg total) by mouth 3 (three) times daily.  09/16/21   Nolberto Hanlon, MD  ?Benzocaine 10 MG LOZG Use as directed 1 lozenge in the mouth or throat every 2 (two) hours as needed (sore throat).    [provider]  ?bisacodyl (DULCOLAX) 10 MG suppository Place 1 suppository (10 mg total) rectally every Tuesday, Thursday, Saturday, and Sunday at 6 PM. 02/15/18   Roxan Hockey, MD  ?Brimonidine Tartrate (LUMIFY) 0.025 % SOLN Place 1 drop into both eyes daily as needed (redness).    [provider]  ?calcium-vitamin D (OSCAL WITH D) 500-200 MG-UNIT tablet Take 1 tablet by mouth 2 (two) times daily.    [provider]  ?carboxymethylcellulose 1 % ophthalmic solution Place 1 drop into both eyes every 6 (six) hours as needed (comfort).    [provider]  ?Carboxymethylcellulose Sod PF 1 % GEL Place 1 drop into both eyes in the morning, at noon, in the evening, and at bedtime.    [provider]  ?carvedilol (COREG) 12.5 MG tablet Take 1 tablet (12.5 mg total) by mouth 2 (two) times daily with a meal. Hold for SBP <100 09/23/21   Minus Breeding, MD  ?cetirizine (ZYRTEC) 10 MG tablet Take 10 mg by mouth daily as needed for allergies.     [provider]  ?cholecalciferol (VITAMIN D) 1000 units tablet Take 1,000 Units by mouth daily.    [provider]  ?Cranberry 450 MG TABS Take 450 mg by mouth daily.    [provider]  ?Dextromethorphan-guaiFENesin 10-100  MG/5ML liquid Take 15 mLs by mouth every 4 (four) hours as needed. ?Patient taking differently: Take 15 mLs by mouth every 4 (four) hours as needed (cough). 04/14/21   Sanjuan Dame, MD  ?diclofenac Sodium (VOLTAREN) 1 % GEL Apply 2 g topically 3 (three) times daily. Apply to bilateral shoulders 07/30/21   [provider]  ?docusate sodium (COLACE) 100 MG capsule Take 1 capsule (100 mg total) by mouth 3 (three) times daily as needed for mild constipation. 09/16/21   Nolberto Hanlon, MD  ?ferrous gluconate (FERGON) 324 MG tablet Take 1  tablet (324 mg total) by mouth daily. 07/28/21   Orvis Brill, MD  ?hydrocortisone 2.5 % lotion Apply 1 application. topically every 12 (twelve) hours as needed (itching of arms and legs).    [provider]  ?hydroquinone 2 % cream Apply 1 application. topically 2 (two) times daily. To dark spots    [provider]  ?hydroxychloroquine (PLAQUENIL) 200 MG tablet Take 200 mg by mouth daily.     [provider]  ?LINZESS 290 MCG CAPS capsule Take 290 mcg by mouth daily. 07/18/21   [provider]  ?losartan (COZAAR) 25 MG tablet Take 1 tablet (25 mg total) by mouth daily. 09/23/21 12/22/21  Minus Breeding, MD  ?lubiprostone (AMITIZA) 24 MCG capsule Take 24 mcg by mouth in the morning and at bedtime.    [provider]  ?melatonin 3 MG TABS tablet Take 6 mg by mouth at bedtime as needed (sleep).    [provider]  ?Multiple Vitamin (MULTIVITAMIN WITH MINERALS) TABS tablet Take 1 tablet by mouth daily.    [provider]  ?omeprazole (PRILOSEC) 20 MG capsule Take 20 mg by mouth 2 (two) times daily.    [provider]  ?ondansetron (ZOFRAN) 4 MG tablet Take 4 mg by mouth every 8 (eight) hours as needed for nausea or vomiting.    [provider]  ?oxybutynin (DITROPAN-XL) 5 MG 24 hr tablet Take 5 mg by mouth daily. 12/04/20   [provider]  ?pantoprazole (PROTONIX) 40 MG tablet Take 1 tablet (40 mg total) by mouth 2 (two) times daily. 04/14/21 11/29/21  Sanjuan Dame, MD  ?polyethylene glycol Merril Abbe / Floria Raveling) packet Take 17 g by mouth every Monday, Wednesday, Friday, and Saturday at 6 PM. ?Patient taking differently: Take 17 g by mouth See admin instructions. In the evening on Monday,Wednesday,Friday and saturday 02/14/18   Roxan Hockey, MD  ?Prenatal Vit-Fe Fumarate-FA (PRENATAL MULTIVITAMIN) TABS tablet Take 1 tablet by mouth daily at 12 noon. chewable    [provider]  ?Propylene Glycol (SYSTANE COMPLETE  OP) Place 2 drops into both eyes in the morning, at noon, in the evening, and at bedtime.    [provider]  ?sennosides-docusate sodium (SENOKOT-S) 8.6-50 MG tablet Take 2 tablets by mouth 2 (two) times daily. ?Patient taking differently: Take 2 tablets by mouth in the morning and at bedtime. 02/14/18   Roxan Hockey, MD  ?sertraline (ZOLOFT) 50 MG tablet Take 1 tablet (50 mg total) by mouth daily. 09/16/21   Nolberto Hanlon, MD  ?Simethicone 125 MG TABS Take 125 mg by mouth every 8 (eight) hours as needed (gas pain).    [provider]  ?sodium chloride (OCEAN) 0.65 % SOLN nasal spray Place 1 spray into both nostrils every 2 (two) hours as needed (dry nasal passages).    [provider]  ?sucralfate (CARAFATE) 1 GM/10ML suspension Take 1 g by mouth every 6 (six)  hours as needed (gerd).    [provider]  ?tiZANidine (ZANAFLEX) 4 MG tablet Take 4 mg by mouth 3 (three) times daily.    [provider]  ?traMADol (ULTRAM) 50 MG tablet Take 100 mg by mouth every 6 (six) hours as needed (muscle spasms).    [provider]  ?triamcinolone cream (KENALOG) 0.1 % Apply 1 application. topically 2 (two) times daily. 08/06/21   [provider]  ?Zinc Oxide (TRIPLE PASTE) 12.8 % ointment Apply 1 application topically as needed for irritation. 04/14/21   Sanjuan Dame, MD  ?zinc sulfate 220 (50 Zn) MG capsule Take 220 mg by mouth daily.    [provider]  ?   ? ?Allergies    ?Patient has no known allergies.   ? ?Review of Systems   ?Review of Systems  ?All other systems reviewed and are negative. ? ?Physical Exam ?Updated Vital Signs ?There were no vitals taken for this visit. ?Physical Exam ?Vitals and nursing note reviewed.  ?Constitutional:   ?   General: She is not in acute distress. ?   Appearance: Normal appearance. She is well-developed.  ?HENT:  ?   Head: Normocephalic and atraumatic.  ?Eyes:  ?   Conjunctiva/sclera: Conjunctivae normal.  ?    Pupils: Pupils are equal, round, and reactive to light.  ?Cardiovascular:  ?   Rate and Rhythm: Normal rate and regular rhythm.  ?   Heart sounds: Normal heart sounds.  ?Pulmonary:  ?   Effort: Pulmonary effor

## 2021-10-11 NOTE — Discharge Instructions (Signed)
Return for any problem.  ?

## 2021-10-11 NOTE — ED Provider Notes (Signed)
IR placed catheter resutured. ?Placement confirmed with contrast infusion through cath and pelvic xray. ?Stable for DC ?  ?Fatima Blank, MD ?10/11/21 [phone] ? ?

## 2021-10-11 NOTE — ED Notes (Signed)
Suprapubic catheter flushed with sterile saline, good drainage of bladder noted. ? ?

## 2021-10-11 NOTE — ED Triage Notes (Signed)
Pt was just discharged and sent back to facility this morning. Pt is back due to suprapubic cath having a syringe taped to the end versus being attached to a catheter bag. ?

## 2021-10-17 ENCOUNTER — Ambulatory Visit (HOSPITAL_COMMUNITY)
Admission: RE | Admit: 2021-10-17 | Discharge: 2021-10-17 | Disposition: A | Discharge status: Home / Self Care | Payer: Medicare Other | Source: Ambulatory Visit | Attending: Interventional Radiology | Admitting: Interventional Radiology

## 2021-10-17 ENCOUNTER — Ambulatory Visit (HOSPITAL_COMMUNITY)
Admission: RE | Admit: 2021-10-17 | Discharge: 2021-10-17 | Disposition: A | Discharge status: Home / Self Care | Payer: Medicare Other | Source: Ambulatory Visit | Attending: Adult Health | Admitting: Adult Health

## 2021-10-17 ENCOUNTER — Other Ambulatory Visit: Payer: Self-pay

## 2021-10-17 DIAGNOSIS — R160 Hepatomegaly, not elsewhere classified: Secondary | ICD-10-CM

## 2021-10-17 HISTORY — PX: IR CATHETER TUBE CHANGE: IMG717

## 2021-10-17 MED ORDER — IOHEXOL 300 MG/ML  SOLN
100.0000 mL | Freq: Once | INTRAMUSCULAR | Status: AC | PRN
  Administered 2021-10-17: 100 mL via INTRAVENOUS

## 2021-10-17 MED ORDER — SODIUM CHLORIDE (PF) 0.9 % IJ SOLN
INTRAMUSCULAR | Status: AC
  Filled 2021-10-17: qty 50

## 2021-10-17 MED ORDER — IOHEXOL 300 MG/ML  SOLN
50.0000 mL | Freq: Once | INTRAMUSCULAR | Status: AC | PRN
  Administered 2021-10-17: 10 mL

## 2021-10-21 ENCOUNTER — Ambulatory Visit
Admission: RE | Admit: 2021-10-21 | Discharge: 2021-10-21 | Disposition: A | Discharge status: Home / Self Care | Payer: Medicaid Other | Source: Ambulatory Visit | Attending: Interventional Radiology | Admitting: Interventional Radiology

## 2021-10-21 ENCOUNTER — Other Ambulatory Visit: Payer: Self-pay

## 2021-10-21 DIAGNOSIS — R160 Hepatomegaly, not elsewhere classified: Secondary | ICD-10-CM

## 2021-10-21 DIAGNOSIS — C220 Liver cell carcinoma: Secondary | ICD-10-CM

## 2021-10-21 HISTORY — PX: IR RADIOLOGIST EVAL & MGMT: IMG5224
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
## 2021-11-03 ENCOUNTER — Inpatient Hospital Stay: Payer: Medicaid Other | Admitting: Oncology

## 2021-11-03 LAB — AFP TUMOR MARKER: AFP-Tumor Marker: 10.9 ng/mL — ABNORMAL HIGH

## 2021-11-05 ENCOUNTER — Telehealth: Payer: Self-pay

## 2021-11-05 NOTE — Telephone Encounter (Signed)
Patient due for CCS/Cologuard.  Letter and MyChart message sent to pt ?

## 2021-11-14 ENCOUNTER — Inpatient Hospital Stay: Payer: Medicare Other | Attending: Nurse Practitioner | Admitting: Nurse Practitioner

## 2021-11-14 ENCOUNTER — Encounter (HOSPITAL_BASED_OUTPATIENT_CLINIC_OR_DEPARTMENT_OTHER): Payer: Medicare Other | Admitting: Physical Medicine and Rehabilitation

## 2021-11-14 ENCOUNTER — Encounter: Payer: Self-pay | Admitting: Physical Medicine and Rehabilitation

## 2021-11-14 ENCOUNTER — Encounter: Payer: Self-pay | Admitting: Nurse Practitioner

## 2021-11-14 VITALS — BP 133/87 | HR 81 | Ht 65.0 in | Wt 157.0 lb

## 2021-11-14 VITALS — BP 98/65 | HR 93 | Temp 98.1°F | Resp 18 | Ht 65.0 in

## 2021-11-14 DIAGNOSIS — Z9013 Acquired absence of bilateral breasts and nipples: Secondary | ICD-10-CM | POA: Diagnosis not present

## 2021-11-14 DIAGNOSIS — R252 Cramp and spasm: Secondary | ICD-10-CM | POA: Insufficient documentation

## 2021-11-14 DIAGNOSIS — K219 Gastro-esophageal reflux disease without esophagitis: Secondary | ICD-10-CM | POA: Insufficient documentation

## 2021-11-14 DIAGNOSIS — Z9359 Other cystostomy status: Secondary | ICD-10-CM | POA: Insufficient documentation

## 2021-11-14 DIAGNOSIS — G8221 Paraplegia, complete: Secondary | ICD-10-CM | POA: Insufficient documentation

## 2021-11-14 DIAGNOSIS — I509 Heart failure, unspecified: Secondary | ICD-10-CM | POA: Insufficient documentation

## 2021-11-14 DIAGNOSIS — Z853 Personal history of malignant neoplasm of breast: Secondary | ICD-10-CM | POA: Insufficient documentation

## 2021-11-14 DIAGNOSIS — C220 Liver cell carcinoma: Secondary | ICD-10-CM | POA: Diagnosis present

## 2021-11-14 DIAGNOSIS — F32A Depression, unspecified: Secondary | ICD-10-CM | POA: Diagnosis not present
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
## 2021-11-14 NOTE — Addendum Note (Signed)
Addended by: Jasmine December T on: 11/14/2021 12:22 PM ? ? Modules accepted: Orders ? ?
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
## 2021-11-16 ENCOUNTER — Emergency Department (HOSPITAL_COMMUNITY)
Admission: EM | Admit: 2021-11-16 | Discharge: 2021-11-16 | Disposition: A | Discharge status: Home / Self Care | Payer: Medicare Other | Attending: Emergency Medicine | Admitting: Emergency Medicine

## 2021-11-16 ENCOUNTER — Other Ambulatory Visit: Payer: Self-pay

## 2021-11-16 DIAGNOSIS — G8220 Paraplegia, unspecified: Secondary | ICD-10-CM | POA: Diagnosis not present

## 2021-11-16 DIAGNOSIS — T83011A Breakdown (mechanical) of indwelling urethral catheter, initial encounter: Secondary | ICD-10-CM

## 2021-11-16 DIAGNOSIS — Z7982 Long term (current) use of aspirin: Secondary | ICD-10-CM | POA: Insufficient documentation

## 2021-11-16 DIAGNOSIS — T83030A Leakage of cystostomy catheter, initial encounter: Secondary | ICD-10-CM | POA: Insufficient documentation

## 2021-11-16 MED ORDER — BACLOFEN 10 MG PO TABS
10.0000 mg | ORAL_TABLET | Freq: Three times a day (TID) | ORAL | Status: DC
  Administered 2021-11-16: 10 mg via ORAL
  Filled 2021-11-16 (×3): qty 1

## 2021-11-16 MED ORDER — FUROSEMIDE 20 MG PO TABS
40.0000 mg | ORAL_TABLET | Freq: Two times a day (BID) | ORAL | Status: DC
Start: 2021-11-16 — End: 2021-11-16
  Administered 2021-11-16: 40 mg via ORAL
  Filled 2021-11-16: qty 2

## 2021-11-16 MED ORDER — ATORVASTATIN CALCIUM 40 MG PO TABS
40.0000 mg | ORAL_TABLET | Freq: Every day | ORAL | Status: DC
  Administered 2021-11-16: 40 mg via ORAL
  Filled 2021-11-16: qty 1

## 2021-11-16 MED ORDER — ACETAMINOPHEN 325 MG PO TABS: 650.0000 mg | ORAL_TABLET | Freq: Four times a day (QID) | ORAL | Status: DC | PRN

## 2021-11-16 MED ORDER — LIDOCAINE HCL URETHRAL/MUCOSAL 2 % EX GEL
1.0000 | Freq: Once | CUTANEOUS | Status: AC
Start: 2021-11-16 — End: 2021-11-16
  Administered 2021-11-16: 1 via TOPICAL
  Filled 2021-11-16: qty 11

## 2021-11-16 MED ORDER — SERTRALINE HCL 50 MG PO TABS
50.0000 mg | ORAL_TABLET | Freq: Every day | ORAL | Status: DC
Start: 2021-11-16 — End: 2021-11-16
  Administered 2021-11-16: 50 mg via ORAL
  Filled 2021-11-16: qty 1

## 2021-11-16 NOTE — ED Provider Notes (Signed)
?[facility] ?Provider Note ? ? ?CSN: [id number] ?Arrival date & time: 11/16/21  0744 ? ?  ? ?History ? ?Chief Complaint  ?Patient presents with  ? foley catheter leakage  ? ? ?[name] is a 71 y.o. female with a chronic suprapubic catheter, history of paraplegia, presenting to the emergency department with concern for urethral urination.  The patient reports that she began having episodes of urinating through her urethra over the weekend, which is unusual since she has had a suprapubic catheter placed.  She says she woke up noting that the bed was wet.  She does see a urologist for the suprapubic catheter. ? ?She has been seen multiple times for catheter complications in the past month my review of the medical chart. ? ?She has a history of multidrug-resistant UTIs.  At this time she is having no fevers or chills. ? ?HPI ? ?  ? ?Home Medications ?Prior to Admission medications   ?Medication Sig Start Date End Date Taking? Authorizing Provider  ?acetaminophen (TYLENOL) 325 MG tablet Take 650 mg by mouth every 6 (six) hours as needed.    [provider]  ?alum & mag hydroxide-simeth (MAALOX PLUS) 400-400-40 MG/5ML suspension Take 10 mLs by mouth every 6 (six) hours as needed for indigestion, heartburn or flatulence (nausea).    [provider]  ?Amino Acids-Protein Hydrolys (FEEDING SUPPLEMENT, PRO-STAT 64,) LIQD Take 30 mLs by mouth 2 (two) times daily. For wound healing ?Patient not taking: Reported on 11/14/2021    [provider]  ?aspirin EC 81 MG tablet Take 81 mg by mouth daily. Swallow whole.    [provider]  ?atorvastatin (LIPITOR) 40 MG tablet Take 1 tablet (40 mg total) by mouth daily. 07/28/21 11/29/21  Orvis Brill, MD  ?baclofen (LIORESAL) 10 MG tablet Take 1 tablet (10 mg total) by mouth 3 (three) times daily. 09/16/21   Nolberto Hanlon, MD  ?Benzocaine 10 MG LOZG Use as directed 1 lozenge in the mouth or throat every 2  (two) hours as needed (sore throat).    [provider]  ?bisacodyl (DULCOLAX) 10 MG suppository Place 1 suppository (10 mg total) rectally every Tuesday, Thursday, Saturday, and Sunday at 6 PM. 02/15/18   Roxan Hockey, MD  ?Brimonidine Tartrate (LUMIFY) 0.025 % SOLN Place 1 drop into both eyes daily as needed (redness).    [provider]  ?calcium-vitamin D (OSCAL WITH D) 500-200 MG-UNIT tablet Take 1 tablet by mouth 2 (two) times daily. ?Patient not taking: Reported on 11/14/2021    [provider]  ?carboxymethylcellulose 1 % ophthalmic solution Place 1 drop into both eyes every 6 (six) hours as needed (comfort).    [provider]  ?Carboxymethylcellulose Sod PF 1 % GEL Place 1 drop into both eyes in the morning, at noon, in the evening, and at bedtime.    [provider]  ?carvedilol (COREG) 12.5 MG tablet Take 1 tablet (12.5 mg total) by mouth 2 (two) times daily with a meal. Hold for SBP <100 09/23/21   Minus Breeding, MD  ?cetirizine (ZYRTEC) 10 MG tablet Take 10 mg by mouth daily as needed for allergies.     [provider]  ?cholecalciferol (VITAMIN D) 1000 units tablet Take 1,000 Units by mouth daily.    [provider]  ?Cranberry 450 MG TABS Take 450 mg by mouth daily.    [provider]  ?Dextromethorphan-guaiFENesin 10-100 MG/5ML liquid Take 15 mLs by mouth every 4 (  four) hours as needed. ?Patient not taking: Reported on 11/14/2021 04/14/21   Sanjuan Dame, MD  ?diclofenac Sodium (VOLTAREN) 1 % GEL Apply 2 g topically 3 (three) times daily. Apply to bilateral shoulders 07/30/21   [provider]  ?docusate sodium (COLACE) 100 MG capsule Take 1 capsule (100 mg total) by mouth 3 (three) times daily as needed for mild constipation. 09/16/21   Nolberto Hanlon, MD  ?ferrous gluconate (FERGON) 324 MG tablet Take 1 tablet (324 mg total) by mouth daily. 07/28/21   Orvis Brill, MD  ?furosemide (LASIX) 40 MG tablet Take 40 mg  by mouth 2 (two) times daily. 10/13/21   [provider]  ?hydrocortisone 2.5 % lotion Apply 1 application. topically every 12 (twelve) hours as needed (itching of arms and legs).    [provider]  ?hydroquinone 2 % cream Apply 1 application. topically 2 (two) times daily. To dark spots    [provider]  ?hydroxychloroquine (PLAQUENIL) 200 MG tablet Take 200 mg by mouth daily.     [provider]  ?LINZESS 290 MCG CAPS capsule Take 290 mcg by mouth daily. 07/18/21   [provider]  ?losartan (COZAAR) 25 MG tablet Take 1 tablet (25 mg total) by mouth daily. ?Patient not taking: Reported on 11/14/2021 09/23/21 12/22/21  Minus Breeding, MD  ?lubiprostone (AMITIZA) 24 MCG capsule Take 24 mcg by mouth in the morning and at bedtime. ?Patient not taking: Reported on 11/14/2021    [provider]  ?melatonin 3 MG TABS tablet Take 6 mg by mouth at bedtime as needed (sleep).    [provider]  ?Multiple Vitamin (MULTIVITAMIN WITH MINERALS) TABS tablet Take 1 tablet by mouth daily.    [provider]  ?omeprazole (PRILOSEC) 20 MG capsule Take 20 mg by mouth 2 (two) times daily.    [provider]  ?ondansetron (ZOFRAN) 4 MG tablet Take 4 mg by mouth every 8 (eight) hours as needed for nausea or vomiting.    [provider]  ?oxybutynin (DITROPAN-XL) 5 MG 24 hr tablet Take 5 mg by mouth daily. 12/04/20   [provider]  ?pantoprazole (PROTONIX) 40 MG tablet Take 1 tablet (40 mg total) by mouth 2 (two) times daily. 04/14/21 11/29/21  Sanjuan Dame, MD  ?polyethylene glycol Merril Abbe / Floria Raveling) packet Take 17 g by mouth every Monday, Wednesday, Friday, and Saturday at 6 PM. ?Patient taking differently: Take 17 g by mouth See admin instructions. In the evening on Monday,Wednesday,Friday and saturday 02/14/18   Roxan Hockey, MD  ?Prenatal Vit-Fe Fumarate-FA (PRENATAL MULTIVITAMIN) TABS tablet Take 1 tablet by mouth daily at 12  noon. chewable    [provider]  ?Propylene Glycol (SYSTANE COMPLETE OP) Place 2 drops into both eyes in the morning, at noon, in the evening, and at bedtime.    [provider]  ?sennosides-docusate sodium (SENOKOT-S) 8.6-50 MG tablet Take 2 tablets by mouth 2 (two) times daily. ?Patient taking differently: Take 2 tablets by mouth in the morning and at bedtime. 02/14/18   Roxan Hockey, MD  ?sertraline (ZOLOFT) 50 MG tablet Take 1 tablet (50 mg total) by mouth daily. 09/16/21   Nolberto Hanlon, MD  ?Simethicone 125 MG TABS Take 125 mg by mouth every 8 (eight) hours as needed (gas pain).    [provider]  ?sodium chloride (OCEAN) 0.65 % SOLN nasal spray Place 1 spray into both nostrils every 2 (two) hours as needed (dry nasal passages).    [provider]  ?sucralfate (CARAFATE) 1 GM/10ML suspension Take 1 g by mouth every 6 (six) hours as needed (gerd).    [provider]  ?tiZANidine (ZANAFLEX) 4 MG tablet Take 4 mg by mouth 3 (three) times daily.    [provider]  ?traMADol (ULTRAM) 50 MG tablet Take 100 mg by mouth every 6 (six) hours as needed (muscle spasms).    [provider]  ?triamcinolone cream (KENALOG) 0.1 % Apply 1 application. topically 2 (two) times daily. 08/06/21   [provider]  ?Zinc Oxide (TRIPLE PASTE) 12.8 % ointment Apply 1 application topically as needed for irritation. 04/14/21   Sanjuan Dame, MD  ?zinc sulfate 220 (50 Zn) MG capsule Take 220 mg by mouth daily.    [provider]  ?   ? ?Allergies    ?Patient has no known allergies.   ? ?Review of Systems   ?Review of Systems ? ?Physical Exam ?Updated Vital Signs ?BP (!) 112/54   Pulse 80   Temp 98.2 ?F (36.8 ?C) (Oral)   Resp 18   Ht 5' 5"  (1.651 m)   Wt 56.2 kg   SpO2 97%   BMI 20.63 kg/m?  ?Physical Exam ?Constitutional:   ?   General: She is not in acute distress. ?HENT:  ?   Head: Normocephalic and atraumatic.  ?Eyes:  ?    Conjunctiva/sclera: Conjunctivae normal.  ?   Pupils: Pupils are equal, round, and reactive to light.  ?Cardiovascular:  ?   Rate and Rhythm: Normal rate and regular rhythm.  ?Pulmonary:  ?   Effort: Pulmonary effort is norma

## 2021-11-16 NOTE — ED Notes (Signed)
Suprapubic catheter irrigated with 23m. No return NS.  ?

## 2021-11-16 NOTE — ED Triage Notes (Signed)
PT BIB [facility] as [facility] noticed her suprapubic foley has been leaking around the catheter. Pt states that she is also having urine coming out of her urethra and around her catheter.  Pt endorses brown discharge from foley site. ? ?VS Pulse 138, HR 70, SpO2 97% ?

## 2021-11-16 NOTE — Discharge Instructions (Signed)
The suprapubic foley catheter was exchanged for a new 16 french catheter and clean bag in the ER today.  Please be aware in the future this should be managed by a urologist, and I strongly recommend that [facility] follows up with a urologist as soon as possible.  She should be on a regular schedule for having her foley exchanged every 4-6 weeks to prevent infection.   ? ?This should not be managed through emergency department visits - unless there is concern for new infections, sepsis, or foley/bladder obstruction, or other medical emergency concerns. ?

## 2021-11-16 NOTE — ED Notes (Signed)
Suprapubic catheter changed by provider. 15F foley placed in suprapubic stoma with no complications. Immediate urine return noted.  ?

## 2021-12-26 ENCOUNTER — Telehealth: Payer: Self-pay | Admitting: Gastroenterology

## 2021-12-26 ENCOUNTER — Ambulatory Visit (INDEPENDENT_AMBULATORY_CARE_PROVIDER_SITE_OTHER): Payer: Medicare Other | Admitting: Gastroenterology

## 2021-12-26 ENCOUNTER — Encounter: Payer: Self-pay | Admitting: Gastroenterology

## 2021-12-26 VITALS — BP 120/80 | HR 100 | Ht 65.0 in

## 2021-12-26 DIAGNOSIS — K219 Gastro-esophageal reflux disease without esophagitis: Secondary | ICD-10-CM

## 2021-12-26 DIAGNOSIS — Z1211 Encounter for screening for malignant neoplasm of colon: Secondary | ICD-10-CM

## 2021-12-26 DIAGNOSIS — Z860100 Personal history of colon polyps, unspecified: Secondary | ICD-10-CM

## 2021-12-26 DIAGNOSIS — K5909 Other constipation: Secondary | ICD-10-CM

## 2021-12-26 DIAGNOSIS — C220 Liver cell carcinoma: Secondary | ICD-10-CM | POA: Diagnosis not present

## 2021-12-26 DIAGNOSIS — K7460 Unspecified cirrhosis of liver: Secondary | ICD-10-CM

## 2021-12-26 MED ORDER — IBSRELA 50 MG PO TABS: 50.0000 mg | ORAL_TABLET | Freq: Every day | ORAL | 0 refills | Status: DC

## 2021-12-26 MED ORDER — POLYETHYLENE GLYCOL 3350 17 G PO PACK: 17.0000 g | PACK | Freq: Every day | ORAL | 5 refills | Status: AC

## 2021-12-26 NOTE — Telephone Encounter (Signed)
Called and spoke to [name]  She wanted to confirm how to take Ibsrela.  All questions answered

## 2021-12-26 NOTE — Telephone Encounter (Signed)
[place] FROM THE [facility] CALLING IN REGARDS TO PATIENT ORDERS FROM A PRIOR VISIT TODAY. PATIENT COORDINATOR STATES SHE IS UNCLEAR OF THE ORDERS FOR THE PATIENT . PLEASE GIVE HER A CALL BACK TO [facility] AT [phone]
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
## 2021-12-26 NOTE — Patient Instructions (Addendum)
If you are age 71 or older, your body mass index should be between 23-30. Your Body mass index is 20.63 kg/m. If this is out of the aforementioned range listed, please consider follow up with your Primary Care Provider.  If you are age 67 or younger, your body mass index should be between 19-25. Your Body mass index is 20.63 kg/m. If this is out of the aformentioned range listed, please consider follow up with your Primary Care Provider.   ________________________________________________________  The [facility] providers would like to encourage you to use [facility] to communicate with providers for non-urgent requests or questions.  Due to long hold times on the telephone, sending your provider a message by [facility] may be a faster and more efficient way to get a response.  Please allow 48 business hours for a response.  Please remember that this is for non-urgent requests.  _______________________________________________________  Please stop colace, senna and Protonix. Continue Prilosec.  We have given you samples of the following medication to take: IBSRela 50 mg: Take once to twice daily (Hold Linzess while trying IBSrela)   Increase Miralax to once to twice a day as needed  You can use 2 fleet enemas.  Your provider has ordered Cologuard testing as an option for colon cancer screening. This is performed by [employer] and may be out of network with your insurance. PRIOR to completing the test, it is YOUR responsibility to contact your insurance about covered benefits for this test. Your out of pocket expense could be anywhere from $0.00 to $649.00.   When you call to check coverage with your insurer, please provide the following information:   -The ONLY provider of Cologuard is [place] code for Cologuard is [phone].  [employer] NPI # [id number]  -[employer] Tax ID # [id number]   We have already sent your demographic and insurance  information to [employer] (phone number [phone]) and they should contact you within the next week regarding your test. If you have not heard from them within the next week, please call our office at [phone].  Thank you for entrusting me with your care and for choosing [facility], Dr. K[place]ong Cellar

## 2022-01-08 ENCOUNTER — Encounter: Payer: Medicare Other | Attending: Physical Medicine and Rehabilitation | Admitting: Physical Medicine & Rehabilitation

## 2022-01-08 ENCOUNTER — Encounter: Payer: Self-pay | Admitting: Physical Medicine & Rehabilitation

## 2022-01-08 VITALS — BP 123/74 | HR 75 | Ht 65.0 in | Wt 160.2 lb

## 2022-01-08 DIAGNOSIS — G8221 Paraplegia, complete: Secondary | ICD-10-CM | POA: Insufficient documentation

## 2022-01-08 NOTE — Patient Instructions (Signed)
Dr Dagoberto Ligas will assess in ~6wks  Please inform her if knees extend more easily   Also inform her if Knees start pulling together more

## 2022-01-08 NOTE — Progress Notes (Signed)
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 This includes bleeding, bruising, infection, excessive weakness, or medication side effects. A REMS form is on file and signed. Needle: 25g 41m  needle electrode Number of units per muscle  Hamstrings medial 150U RIght and 150U Left All injections were done after obtaining appropriate [name] activity and after negative drawback for blood. The patient tolerated the procedure well. Post procedure instructions were given. A followup appointment was made.

## 2022-01-23 ENCOUNTER — Encounter (HOSPITAL_BASED_OUTPATIENT_CLINIC_OR_DEPARTMENT_OTHER): Payer: Medicare Other | Attending: Internal Medicine | Admitting: Internal Medicine

## 2022-01-23 DIAGNOSIS — Z853 Personal history of malignant neoplasm of breast: Secondary | ICD-10-CM | POA: Diagnosis not present

## 2022-01-23 DIAGNOSIS — B182 Chronic viral hepatitis C: Secondary | ICD-10-CM | POA: Insufficient documentation

## 2022-01-23 DIAGNOSIS — Z89431 Acquired absence of right foot: Secondary | ICD-10-CM | POA: Insufficient documentation

## 2022-01-23 DIAGNOSIS — Z87891 Personal history of nicotine dependence: Secondary | ICD-10-CM | POA: Insufficient documentation

## 2022-01-23 DIAGNOSIS — M069 Rheumatoid arthritis, unspecified: Secondary | ICD-10-CM | POA: Diagnosis not present

## 2022-01-23 DIAGNOSIS — G8220 Paraplegia, unspecified: Secondary | ICD-10-CM | POA: Insufficient documentation

## 2022-01-23 DIAGNOSIS — I509 Heart failure, unspecified: Secondary | ICD-10-CM | POA: Insufficient documentation

## 2022-01-23 DIAGNOSIS — L89622 Pressure ulcer of left heel, stage 2: Secondary | ICD-10-CM | POA: Diagnosis present

## 2022-01-23 NOTE — Progress Notes (Signed)
ZAAKIRAH, KISTNER ([id number]) Visit Report for 01/23/2022 Allergy List Details Patient Name: Date of Service: [facility] RD, [name] 01/23/2022 8:00 A M Medical Record Number: [id number] Patient Account Number: [id number] Date of Birth/Sex: Treating RN: October 07, 1950 (71 y.o. [name], [name] Primary Care Samyuktha Brau: [name] MES Other Clinician: Referring Sheily Lineman: Treating Alanie Syler/Extender: [name], JA MES Weeks in Treatment: 0 Allergies Active Allergies No Known Allergies Allergy Notes Electronic Signature(s) Signed: 01/23/2022 11:43:29 AM By: Rhae Hammock RN Entered By: Rhae Hammock on 01/22/2022 16:32:19 -------------------------------------------------------------------------------- Arrival Information Details Patient Name: Date of Service: [facility] RD, [name] 01/23/2022 8:00 A M Medical Record Number: [id number] Patient Account Number: [id number] Date of Birth/Sex: Treating RN: 10-12-1950 (71 y.o. [name], [name] Primary Care Meshach Perry: [name] MES Other Clinician: Referring Amayra Kiedrowski: Treating Valicia Rief/Extender: [name], JA MES Weeks in Treatment: 0 Visit Information Patient Arrived: Wheel Chair Arrival Time: 08:25 Accompanied By: aide Transfer Assistance: Manual Patient Identification Verified: Yes Secondary Verification Process Completed: Yes Patient Requires Transmission-Based Precautions: No Patient Has Alerts: No History Since Last Visit Added or deleted any medications: No Any new allergies or adverse reactions: No Had a fall or experienced change in activities of daily living that may affect risk of falls: No Signs or symptoms of abuse/neglect since last visito No Hospitalized since last visit: No Implantable device outside of the clinic excluding cellular tissue based products placed in the center since last visit: No Electronic Signature(s) Signed: 01/23/2022 11:43:29 AM By: Rhae Hammock RN Entered By:  Rhae Hammock on 01/23/2022 08:25:52 -------------------------------------------------------------------------------- Encounter Discharge Information Details Patient Name: Date of Service: [facility] RD, [name] 01/23/2022 8:00 A M Medical Record Number: [id number] Patient Account Number: [id number] Date of Birth/Sex: Treating RN: [birth year] (71 y.o. [name], [name] Primary Care Achsah Mcquade: [name] MES Other Clinician: Referring Zeyna Mkrtchyan: Treating Mckenzy Salazar/Extender: [name], JA MES Weeks in Treatment: 0 Encounter Discharge Information Items Post Procedure Vitals Discharge Condition: Stable Temperature (F): 98.7 Ambulatory Status: Wheelchair Pulse (bpm): 74 Discharge Destination: [place] Respiratory Rate (breaths/min): 17 Telephoned: No Blood Pressure (mmHg): 117/74 Orders Sent: Yes Transportation: Private Auto Accompanied By: self Schedule Follow-up Appointment: Yes Clinical Summary of Care: Patient Declined Electronic Signature(s) Signed: 01/23/2022 11:43:29 AM By: Rhae Hammock RN Entered By: Rhae Hammock on 01/23/2022 09:29:22 -------------------------------------------------------------------------------- Lower Extremity Assessment Details Patient Name: Date of Service: [facility] [place], [name] 01/23/2022 8:00 A M Medical Record Number: [id number] Patient Account Number: [id number] Date of Birth/Sex: Treating RN: Mar 26, 1951 (71 y.o. [name], [name] Primary Care Allis Quirarte: [name] MES Other Clinician: Referring Bernhardt Riemenschneider: Treating Theophilus Walz/Extender: [name], JA MES Weeks in Treatment: 0 Edema Assessment Assessed: [Left: Yes] [Right: No] Edema: [Left: N] [Right: o] Calf Left: Right: Point of Measurement: 32 cm From Medial Instep 29.5 cm Ankle Left: Right: Point of Measurement: 10 cm From Medial Instep 20.5 cm Knee To Floor Left: Right: From Medial Instep 39 cm Vascular  Assessment Pulses: Dorsalis Pedis Palpable: [Left:No] Electronic Signature(s) Signed: 01/23/2022 11:43:29 AM By: Rhae Hammock RN Entered By: Rhae Hammock on 01/23/2022 08:35:37 -------------------------------------------------------------------------------- Multi Wound Chart Details Patient Name: Date of Service: [facility] [place] RD, [name] 01/23/2022 8:00 A M Medical Record Number: [id number] Patient Account Number: [id number] Date of Birth/Sex: Treating RN: [birth year] (71 y.o. [name], [name] Primary Care Thaddus Mcdowell: [name] MES Other Clinician: Referring Kaitlan Bin: Treating Tharon Kitch/Extender: [name], JA MES Weeks in Treatment: 0 Vital Signs Height(in): 65 Pulse(bpm): 74 Weight(lbs): 158  Blood Pressure(mmHg): 120/70 Body Mass Index(BMI): 26.3 Temperature(F): 98.7 Respiratory Rate(breaths/min): 17 Photos: [N/A:N/A] Left Calcaneus N/A N/A Wound Location: Blister N/A N/A Wounding Event: Pressure Ulcer N/A N/A Primary Etiology: Anemia, Chronic Obstructive N/A N/A Comorbid History: Pulmonary Disease (COPD), Congestive Heart Failure, Coronary Artery Disease, Hypertension, Rheumatoid Arthritis, Paraplegia, Received Chemotherapy 01/16/2022 N/A N/A Date Acquired: 0 N/A N/A Weeks of Treatment: Open N/A N/A Wound Status: No N/A N/A Wound Recurrence: 3.3x4x0.1 N/A N/A Measurements L x W x D (cm) 10.367 N/A N/A A (cm) : rea 1.037 N/A N/A Volume (cm) : Category/Stage II N/A N/A Classification: Medium N/A N/A Exudate A mount: Serosanguineous N/A N/A Exudate Type: red, brown N/A N/A Exudate Color: Distinct, outline attached N/A N/A Wound Margin: Large (67-100%) N/A N/A Granulation A mount: Red, Pink N/A N/A Granulation Quality: Small (1-33%) N/A N/A Necrotic A mount: Fat Layer (Subcutaneous Tissue): Yes N/A N/A Exposed Structures: Fascia: No Tendon: No Muscle: No Joint: No Bone: No Small (1-33%) N/A  N/A Epithelialization: Treatment Notes Electronic Signature(s) Signed: 01/23/2022 9:28:39 AM By: Kalman Shan DO Signed: 01/23/2022 11:43:29 AM By: Rhae Hammock RN Entered By: Kalman Shan on 01/23/2022 09:19:34 -------------------------------------------------------------------------------- Multi-Disciplinary Care Plan Details Patient Name: Date of Service: [facility] RD, [name] 01/23/2022 8:00 A M Medical Record Number: [id number] Patient Account Number: [id number] Date of Birth/Sex: Treating RN: 12/24/1950 (71 y.o. [name], [name] Primary Care Jerry Haugen: [name] MES Other Clinician: Referring Binnie Vonderhaar: Treating Dam Ashraf/Extender: [name], JA MES Weeks in Treatment: 0 Active Inactive Orientation to the Wound Care Program Nursing Diagnoses: Knowledge deficit related to the wound healing center program Goals: Patient/caregiver will verbalize understanding of the [place] Date Initiated: 01/23/2022 Target Resolution Date: 01/28/2022 Goal Status: Active Interventions: Provide education on orientation to the wound center Notes: Wound/Skin Impairment Nursing Diagnoses: Impaired tissue integrity Knowledge deficit related to ulceration/compromised skin integrity Goals: Patient will demonstrate a reduced rate of smoking or cessation of smoking Date Initiated: 01/23/2022 Target Resolution Date: 01/30/2022 Goal Status: Active Patient/caregiver will verbalize understanding of skin care regimen Date Initiated: 01/23/2022 Target Resolution Date: 01/30/2022 Goal Status: Active Ulcer/skin breakdown will have a volume reduction of 30% by week 4 Date Initiated: 01/23/2022 Target Resolution Date: 01/27/2022 Goal Status: Active Interventions: Assess patient/caregiver ability to obtain necessary supplies Assess patient/caregiver ability to perform ulcer/skin care regimen upon admission and as needed Assess ulceration(s) every  visit Notes: Electronic Signature(s) Signed: 01/23/2022 11:43:29 AM By: Rhae Hammock RN Entered By: Rhae Hammock on 01/23/2022 09:12:39 -------------------------------------------------------------------------------- Pain Assessment Details Patient Name: Date of Service: [name], [name] 01/23/2022 8:00 A M Medical Record Number: [id number] Patient Account Number: [id number] Date of Birth/Sex: Treating RN: 10/15/1950 (71 y.o. [name], [name] Primary Care Jaspreet Bodner: [name] MES Other Clinician: Referring Yobana Culliton: Treating Lakoda Mcanany/Extender: [name], JA MES Weeks in Treatment: 0 Active Problems Location of Pain Severity and Description of Pain Patient Has Paino No Site Locations Pain Management and Medication Current Pain Management: Electronic Signature(s) Signed: 01/23/2022 11:43:29 AM By: Rhae Hammock RN Entered By: Rhae Hammock on 01/23/2022 08:27:58 -------------------------------------------------------------------------------- Patient/Caregiver Education Details Patient Name: Date of Service: [name], [name] 6/30/2023andnbsp8:00 [place] Record Number: [id number] Patient Account Number: [id number] Date of Birth/Gender: Treating RN: 10/25/1950 (71 y.o. [name], [name] Primary Care Physician: [name] MES Other Clinician: Referring Physician: Treating Physician/Extender: [name], JA MES Weeks in Treatment: 0 Education Assessment Education Provided To: Patient Education Topics Provided Welcome T The Wound Care  Center: o Methods: Explain/Verbal Responses: State content correctly Electronic Signature(s) Signed: 01/23/2022 11:43:29 AM By: Rhae Hammock RN Entered By: Rhae Hammock on 01/23/2022 09:12:48 -------------------------------------------------------------------------------- Wound Assessment Details Patient Name: Date of Service: [name], [name] 01/23/2022 8:00 A  M Medical Record Number: [id number] Patient Account Number: [id number] Date of Birth/Sex: Treating RN: 1951-04-26 (71 y.o. [name], [name] Primary Care Thandiwe Siragusa: [name] MES Other Clinician: Referring Domingos Riggi: Treating Alonnie Bieker/Extender: [name], JA MES Weeks in Treatment: 0 Wound Status Wound Number: 5 Primary Pressure Ulcer Etiology: Wound Location: Left Calcaneus Wound Open Wounding Event: Blister Status: Date Acquired: 01/16/2022 Comorbid Anemia, Chronic Obstructive Pulmonary Disease (COPD), Weeks Of Treatment: 0 History: Congestive Heart Failure, Coronary Artery Disease, Hypertension, Clustered Wound: No Rheumatoid Arthritis, Paraplegia, Received Chemotherapy Photos Wound Measurements Length: (cm) 3.3 Width: (cm) 4 Depth: (cm) 0.1 Area: (cm) 10.367 Volume: (cm) 1.037 % Reduction in Area: % Reduction in Volume: Epithelialization: Small (1-33%) Tunneling: No Undermining: No Wound Description Classification: Category/Stage II Wound Margin: Distinct, outline attached Exudate Amount: Medium Exudate Type: Serosanguineous Exudate Color: red, brown Foul Odor After Cleansing: No Slough/Fibrino Yes Wound Bed Granulation Amount: Large (67-100%) Exposed Structure Granulation Quality: Red, Pink Fascia Exposed: No Necrotic Amount: Small (1-33%) Fat Layer (Subcutaneous Tissue) Exposed: Yes Necrotic Quality: Adherent Slough Tendon Exposed: No Muscle Exposed: No Joint Exposed: No Bone Exposed: No Treatment Notes Wound #5 (Calcaneus) Wound Laterality: Left Cleanser Vashe 5.8 (oz) Discharge Instruction: Cleanse the wound with Vashe prior to applying a clean dressing using gauze sponges, not tissue or cotton balls. Peri-Wound Care Topical Primary Dressing Hydrofera Blue Ready Foam, 4x5 in Discharge Instruction: Apply to wound bed as instructed Secondary Dressing ABD Pad, 5x9 Discharge Instruction: Apply over primary dressing as directed. Woven  Gauze Sponge, Non-Sterile 4x4 in Discharge Instruction: Apply over primary dressing as directed. Secured With [employer], 4.5x3.1 (in/yd) Discharge Instruction: Secure with Kerlix as directed. 103M Medipore H Soft Cloth Surgical T ape, 4 x 10 (in/yd) Discharge Instruction: Secure with tape as directed. netting Compression Wrap Compression Stockings Add-Ons Electronic Signature(s) Signed: 01/23/2022 12:19:58 PM By: [name], BSN Entered By: Deon Pilling on 01/23/2022 08:35:26 -------------------------------------------------------------------------------- Vitals Details Patient Name: Date of Service: [place], [name] 01/23/2022 8:00 A M Medical Record Number: [id number] Patient Account Number: [id number] Date of Birth/Sex: Treating RN: 1950/12/09 (71 y.o. [name], [name] Primary Care Arlester Keehan: [name] MES Other Clinician: Referring Salim Forero: Treating Anitra Doxtater/Extender: [name], JA MES Weeks in Treatment: 0 Vital Signs Time Taken: 08:25 Temperature (F): 98.7 Height (in): 65 Pulse (bpm): 74 Source: Stated Respiratory Rate (breaths/min): 17 Weight (lbs): 158 Blood Pressure (mmHg): 120/70 Source: Stated Reference Range: 80 - 120 mg / dl Body Mass Index (BMI): 26.3 Electronic Signature(s) Signed: 01/23/2022 11:43:29 AM By: Rhae Hammock RN Entered By: Rhae Hammock on 01/23/2022 08:26:20
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
## 2022-01-23 NOTE — Progress Notes (Signed)
[name], [name] ([id number]) Visit Report for 01/23/2022 Abuse Risk Screen Details Patient Name: Date of Service: [name], [name] 01/23/2022 8:00 A M Medical Record Number: [id number] Patient Account Number: [id number] Date of Birth/Sex: Treating RN: 05-Aug-1950 (71 y.o. [name], [name] Primary Care Lori Liew: [name] MES Other Clinician: Referring Dionisia Pacholski: Treating Yeilyn Gent/Extender: [name], JA MES Weeks in Treatment: 0 Abuse Risk Screen Items Answer ABUSE RISK SCREEN: Has anyone close to you tried to hurt or harm you recentlyo No Do you feel uncomfortable with anyone in your familyo No Has anyone forced you do things that you didnt want to doo No Electronic Signature(s) Signed: 01/23/2022 11:43:29 AM By: Rhae Hammock RN Entered By: Rhae Hammock on 01/23/2022 08:26:32 -------------------------------------------------------------------------------- Activities of Daily Living Details Patient Name: Date of Service: [name], [name] 01/23/2022 8:00 A M Medical Record Number: [id number] Patient Account Number: [id number] Date of Birth/Sex: Treating RN: 09-08-1950 (71 y.o. [name], [name] Primary Care Tekeshia Klahr: [name] MES Other Clinician: Referring Cher Franzoni: Treating Taggert Bozzi/Extender: [name], JA MES Weeks in Treatment: 0 Activities of Daily Living Items Answer Activities of Daily Living (Please select one for each item) Drive Automobile Not Able T Medications ake Need Assistance Use T elephone Need Assistance Care for Appearance Need Assistance Use T oilet Need Assistance Bath / Shower Need Assistance Dress Self Need Assistance Feed Self Need Assistance Walk Need Assistance Get In / Out Bed Need Assistance Housework Need Assistance Prepare Meals Need Assistance Handle Money Need Assistance Shop for Self Need Assistance Electronic Signature(s) Signed: 01/23/2022 11:43:29 AM By: Rhae Hammock  RN Entered By: Rhae Hammock on 01/23/2022 08:27:07 -------------------------------------------------------------------------------- Education Screening Details Patient Name: Date of Service: [name], [name] 01/23/2022 8:00 A M Medical Record Number: [id number] Patient Account Number: [id number] Date of Birth/Sex: Treating RN: 1951/04/05 (71 y.o. [name], [name] Primary Care Emari Demmer: [name] MES Other Clinician: Referring Kaipo Ardis: Treating Xzavion Doswell/Extender: [name], JA MES Weeks in Treatment: 0 Primary Learner Assessed: Patient Learning Preferences/Education Level/Primary Language Learning Preference: Explanation, Demonstration, Communication Board, Printed Material Highest Education Level: College or Above Preferred Language: Diplomatic Services operational officer Language Barrier: No Translator Needed: No Memory Deficit: No Emotional Barrier: No Cultural/Religious Beliefs Affecting Medical Care: No Physical Barrier Impaired Vision: No Impaired Hearing: No Decreased Hand dexterity: No Knowledge/Comprehension Knowledge Level: High Comprehension Level: High Ability to understand written instructions: High Ability to understand verbal instructions: High Motivation Anxiety Level: Calm Cooperation: Cooperative Education Importance: Denies Need Interest in Health Problems: Asks Questions Perception: Coherent Willingness to Engage in Self-Management High Activities: Readiness to Engage in Self-Management High Activities: Electronic Signature(s) Signed: 01/23/2022 11:43:29 AM By: Rhae Hammock RN Entered By: Rhae Hammock on 01/23/2022 08:27:34 -------------------------------------------------------------------------------- Fall Risk Assessment Details Patient Name: Date of Service: [name] [place], [name] 01/23/2022 8:00 A M Medical Record Number: [id number] Patient Account Number: [id number] Date of Birth/Sex: Treating RN: 09/08/1950 (71  y.o. [name], [name] Primary Care Gillie Fleites: [name] MES Other Clinician: Referring Omarr Hann: Treating Kambrea Carrasco/Extender: [name], JA MES Weeks in Treatment: 0 Fall Risk Assessment Items Have you had 2 or more falls in the last 12 monthso 0 No Have you had any fall that resulted in injury in the last 12 monthso 0 No FALLS RISK SCREEN History of falling - immediate or within 3 months 0 No Secondary diagnosis (Do you have 2 or more medical diagnoseso) 0 No Ambulatory aid None/bed rest/wheelchair/nurse 0 No Crutches/cane/walker 0 No  Furniture 0 No Intravenous therapy Access/Saline/Heparin Lock 0 No Gait/Transferring Normal/ bed rest/ wheelchair 0 No Weak (short steps with or without shuffle, stooped but able to lift head while walking, may seek 0 No support from furniture) Impaired (short steps with shuffle, may have difficulty arising from chair, head down, impaired 0 No balance) Mental Status Oriented to own ability 0 No Electronic Signature(s) Signed: 01/23/2022 11:43:29 AM By: Rhae Hammock RN Entered By: Rhae Hammock on 01/23/2022 08:27:39 -------------------------------------------------------------------------------- Nutrition Risk Screening Details Patient Name: Date of Service: [place], [name] 01/23/2022 8:00 A M Medical Record Number: [id number] Patient Account Number: [id number] Date of Birth/Sex: Treating RN: 09-23-1950 (71 y.o. [name], [name] Primary Care Demika Langenderfer: [name] MES Other Clinician: Referring Tomer Chalmers: Treating Sadeen Wiegel/Extender: [name], JA MES Weeks in Treatment: 0 Height (in): 65 Weight (lbs): 158 Body Mass Index (BMI): 26.3 Nutrition Risk Screening Items Score Screening NUTRITION RISK SCREEN: I have an illness or condition that made me change the kind and/or amount of food I eat 0 No I eat fewer than two meals per day 0 No I eat few fruits and vegetables, or milk products 0 No I  have three or more drinks of beer, liquor or wine almost every day 0 No I have tooth or mouth problems that make it hard for me to eat 0 No I don't always have enough money to buy the food I need 0 No I eat alone most of the time 0 No I take three or more different prescribed or over-the-counter drugs a day 0 No Without wanting to, I have lost or gained 10 pounds in the last six months 0 No I am not always physically able to shop, cook and/or feed myself 0 No Nutrition Protocols Good Risk Protocol 0 No interventions needed Moderate Risk Protocol High Risk Proctocol Risk Level: Good Risk Score: 0 Electronic Signature(s) Signed: 01/23/2022 11:43:29 AM By: Rhae Hammock RN Entered By: Rhae Hammock on 01/23/2022 08:27:44

## 2022-01-29 LAB — COLOGUARD: COLOGUARD: NEGATIVE

## 2022-02-06 ENCOUNTER — Encounter (HOSPITAL_BASED_OUTPATIENT_CLINIC_OR_DEPARTMENT_OTHER): Payer: Medicare Other | Attending: Internal Medicine | Admitting: Internal Medicine

## 2022-02-06 DIAGNOSIS — L89622 Pressure ulcer of left heel, stage 2: Secondary | ICD-10-CM | POA: Insufficient documentation

## 2022-02-06 DIAGNOSIS — I110 Hypertensive heart disease with heart failure: Secondary | ICD-10-CM | POA: Insufficient documentation

## 2022-02-06 DIAGNOSIS — M069 Rheumatoid arthritis, unspecified: Secondary | ICD-10-CM | POA: Insufficient documentation

## 2022-02-06 DIAGNOSIS — G8220 Paraplegia, unspecified: Secondary | ICD-10-CM | POA: Diagnosis not present

## 2022-02-06 DIAGNOSIS — I509 Heart failure, unspecified: Secondary | ICD-10-CM | POA: Insufficient documentation

## 2022-02-06 DIAGNOSIS — B182 Chronic viral hepatitis C: Secondary | ICD-10-CM | POA: Diagnosis not present
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
## 2022-02-06 NOTE — Progress Notes (Signed)
BROOKS, STOTZ ([id number]) Visit Report for 02/06/2022 Arrival Information Details Patient Name: Date of Service: [facility] RD, [name] 02/06/2022 10:45 A M Medical Record Number: [id number] Patient Account Number: [id number] Date of Birth/Sex: Treating RN: 20-Apr-1951 (71 y.o. [name], [name] Primary Care Gale Klar: [name] MES Other Clinician: Referring Ashutosh Dieguez: Treating Nil Xiong/Extender: [name], JA MES Weeks in Treatment: 2 Visit Information History Since Last Visit Added or deleted any medications: No Patient Arrived: Wheel Chair Any new allergies or adverse reactions: No Arrival Time: 10:47 Had a fall or experienced change in No Accompanied By: self activities of daily living that may affect Transfer Assistance: None risk of falls: Patient Identification Verified: Yes Signs or symptoms of abuse/neglect since last visito No Secondary Verification Process Completed: Yes Hospitalized since last visit: No Patient Requires Transmission-Based Precautions: No Implantable device outside of the clinic excluding No Patient Has Alerts: No cellular tissue based products placed in the center since last visit: Has Dressing in Place as Prescribed: Yes Pain Present Now: No Electronic Signature(s) Signed: 02/06/2022 11:57:18 AM By: Rhae Hammock RN Entered By: Rhae Hammock on 02/06/2022 10:48:09 -------------------------------------------------------------------------------- Clinic Level of Care Assessment Details Patient Name: Date of Service: [facility] RD, [name] 02/06/2022 10:45 A M Medical Record Number: [id number] Patient Account Number: [id number] Date of Birth/Sex: Treating RN: 10-18-1950 (71 y.o. [name], [name] Primary Care Antonis Lor: [name] MES Other Clinician: Referring Chrishun Scheer: Treating Galya Dunnigan/Extender: [name], JA MES Weeks in Treatment: 2 Clinic Level of Care Assessment Items TOOL 4 Quantity Score X- 1  0 Use when only an EandM is performed on FOLLOW-UP visit ASSESSMENTS - Nursing Assessment / Reassessment X- 1 10 Reassessment of Co-morbidities (includes updates in patient status) X- 1 5 Reassessment of Adherence to Treatment Plan ASSESSMENTS - Wound and Skin A ssessment / Reassessment X - Simple Wound Assessment / Reassessment - one wound 1 5 []  - 0 Complex Wound Assessment / Reassessment - multiple wounds []  - 0 Dermatologic / Skin Assessment (not related to wound area) ASSESSMENTS - Focused Assessment X- 1 5 Circumferential Edema Measurements - multi extremities []  - 0 Nutritional Assessment / Counseling / Intervention []  - 0 Lower Extremity Assessment (monofilament, tuning fork, pulses) []  - 0 Peripheral Arterial Disease Assessment (using hand held doppler) ASSESSMENTS - Ostomy and/or Continence Assessment and Care []  - 0 Incontinence Assessment and Management []  - 0 Ostomy Care Assessment and Management (repouching, etc.) PROCESS - Coordination of Care X - Simple Patient / Family Education for ongoing care 1 15 []  - 0 Complex (extensive) Patient / Family Education for ongoing care X- 1 10 Staff obtains Programmer, systems, Records, T Results / Process Orders est X- 1 10 Staff telephones HHA, Nursing Homes / Clarify orders / etc []  - 0 Routine Transfer to another Facility (non-emergent condition) []  - 0 Routine Hospital Admission (non-emergent condition) []  - 0 New Admissions / Biomedical engineer / Ordering NPWT Apligraf, etc. , []  - 0 Emergency Hospital Admission (emergent condition) X- 1 10 Simple Discharge Coordination []  - 0 Complex (extensive) Discharge Coordination PROCESS - Special Needs []  - 0 Pediatric / Minor Patient Management []  - 0 Isolation Patient Management []  - 0 Hearing / Language / Visual special needs []  - 0 Assessment of Community assistance (transportation, D/C planning, etc.) []  - 0 Additional assistance / Altered mentation []  -  0 Support Surface(s) Assessment (bed, cushion, seat, etc.) INTERVENTIONS - Wound Cleansing / Measurement X - Simple Wound Cleansing - one wound 1  5 []  - 0 Complex Wound Cleansing - multiple wounds X- 1 5 Wound Imaging (photographs - any number of wounds) []  - 0 Wound Tracing (instead of photographs) X- 1 5 Simple Wound Measurement - one wound []  - 0 Complex Wound Measurement - multiple wounds INTERVENTIONS - Wound Dressings X - Small Wound Dressing one or multiple wounds 1 10 []  - 0 Medium Wound Dressing one or multiple wounds []  - 0 Large Wound Dressing one or multiple wounds []  - 0 Application of Medications - topical []  - 0 Application of Medications - injection INTERVENTIONS - Miscellaneous []  - 0 External ear exam []  - 0 Specimen Collection (cultures, biopsies, blood, body fluids, etc.) []  - 0 Specimen(s) / Culture(s) sent or taken to Lab for analysis []  - 0 Patient Transfer (multiple staff / Civil Service fast streamer / Similar devices) []  - 0 Simple Staple / Suture removal (25 or less) []  - 0 Complex Staple / Suture removal (26 or more) []  - 0 Hypo / Hyperglycemic Management (close monitor of Blood Glucose) []  - 0 Ankle / Brachial Index (ABI) - do not check if billed separately X- 1 5 Vital Signs Has the patient been seen at the hospital within the last three years: Yes Total Score: 100 Level Of Care: New/Established - Level 3 Electronic Signature(s) Signed: 02/06/2022 11:57:18 AM By: Rhae Hammock RN Entered By: Rhae Hammock on 02/06/2022 11:22:02 -------------------------------------------------------------------------------- Encounter Discharge Information Details Patient Name: Date of Service: [facility] RD, [name] 02/06/2022 10:45 A M Medical Record Number: [id number] Patient Account Number: [id number] Date of Birth/Sex: Treating RN: 03-16-1951 (71 y.o. [name], [name] Primary Care Sandeep Radell: [name] MES Other Clinician: Referring Alpha Chouinard: Treating  Chaden Doom/Extender: [name], JA MES Weeks in Treatment: 2 Encounter Discharge Information Items Discharge Condition: Stable Ambulatory Status: Wheelchair Discharge Destination: Skilled Nursing Facility Telephoned: No Orders Sent: Yes Transportation: Private Auto Accompanied By: self Schedule Follow-up Appointment: Yes Clinical Summary of Care: Patient Declined Electronic Signature(s) Signed: 02/06/2022 11:57:18 AM By: Rhae Hammock RN Entered By: Rhae Hammock on 02/06/2022 11:22:34 -------------------------------------------------------------------------------- Lower Extremity Assessment Details Patient Name: Date of Service: [facility] RD, [name] 02/06/2022 10:45 A M Medical Record Number: [id number] Patient Account Number: [id number] Date of Birth/Sex: Treating RN: [birth year] (71 y.o. [name], [name] Primary Care Tyler Robidoux: [name] MES Other Clinician: Referring Arnetia Bronk: Treating Lacrecia Delval/Extender: [name], JA MES Weeks in Treatment: 2 Edema Assessment Assessed: [Left: Yes] [Right: No] Edema: [Left: N] [Right: o] Calf Left: Right: Point of Measurement: 32 cm From Medial Instep 29.5 cm Ankle Left: Right: Point of Measurement: 10 cm From Medial Instep 20.5 cm Vascular Assessment Pulses: Dorsalis Pedis Palpable: [Left:Yes] Posterior Tibial Palpable: [Left:Yes] Electronic Signature(s) Signed: 02/06/2022 11:57:18 AM By: Rhae Hammock RN Entered By: Rhae Hammock on 02/06/2022 10:57:07 -------------------------------------------------------------------------------- Multi Wound Chart Details Patient Name: Date of Service: [facility] RD, [name] 02/06/2022 10:45 A M Medical Record Number: [id number] Patient Account Number: [id number] Date of Birth/Sex: Treating RN: 11-15-1950 (71 y.o. [name], [name] Primary Care Tomoya Ringwald: [name] MES Other Clinician: Referring Kendrew Paci: Treating Kairie Vangieson/Extender: [name], JA MES Weeks in Treatment: 2 Vital Signs Height(in): 65 Pulse(bpm): 74 Weight(lbs): 158 Blood Pressure(mmHg): 99/66 Body Mass Index(BMI): 26.3 Temperature(F): 98.7 Respiratory Rate(breaths/min): 17 Photos: [N/A:N/A] Left Calcaneus N/A N/A Wound Location: Blister N/A N/A Wounding Event: Pressure Ulcer N/A N/A Primary Etiology: Anemia, Chronic Obstructive N/A N/A Comorbid History: Pulmonary Disease (COPD), Congestive Heart Failure, Coronary Artery Disease, Hypertension, Rheumatoid Arthritis, Paraplegia, Received  Chemotherapy 01/16/2022 N/A N/A Date Acquired: 2 N/A N/A Weeks of Treatment: Open N/A N/A Wound Status: No N/A N/A Wound Recurrence: 1x1.8x0.1 N/A N/A Measurements L x W x D (cm) 1.414 N/A N/A A (cm) : rea 0.141 N/A N/A Volume (cm) : 86.40% N/A N/A % Reduction in A rea: 86.40% N/A N/A % Reduction in Volume: Category/Stage II N/A N/A Classification: Medium N/A N/A Exudate A mount: Serosanguineous N/A N/A Exudate Type: red, brown N/A N/A Exudate Color: Distinct, outline attached N/A N/A Wound Margin: Large (67-100%) N/A N/A Granulation A mount: Red, Pink, Hyper-granulation N/A N/A Granulation Quality: Small (1-33%) N/A N/A Necrotic A mount: Fat Layer (Subcutaneous Tissue): Yes N/A N/A Exposed Structures: Fascia: No Tendon: No Muscle: No Joint: No Bone: No Small (1-33%) N/A N/A Epithelialization: Treatment Notes Wound #5 (Calcaneus) Wound Laterality: Left Cleanser Vashe 5.8 (oz) Discharge Instruction: Cleanse the wound with Vashe prior to applying a clean dressing using gauze sponges, not tissue or cotton balls. Peri-Wound Care Topical Primary Dressing Hydrofera Blue Ready Foam, 4x5 in Discharge Instruction: Apply to wound bed as instructed Secondary Dressing ABD Pad, 5x9 Discharge Instruction: Apply over primary dressing as directed. Woven Gauze Sponge, Non-Sterile 4x4 in Discharge Instruction: Apply over  primary dressing as directed. Secured With [employer], 4.5x3.1 (in/yd) Discharge Instruction: Secure with Kerlix as directed. 46M Medipore H Soft Cloth Surgical T ape, 4 x 10 (in/yd) Discharge Instruction: Secure with tape as directed. netting Compression Wrap Compression Stockings Add-Ons Electronic Signature(s) Signed: 02/06/2022 11:50:25 AM By: Kalman Shan DO Signed: 02/06/2022 11:57:18 AM By: Rhae Hammock RN Entered By: Kalman Shan on 02/06/2022 11:37:14 -------------------------------------------------------------------------------- Multi-Disciplinary Care Plan Details Patient Name: Date of Service: [facility] RD, [name] 02/06/2022 10:45 A M Medical Record Number: [id number] Patient Account Number: [id number] Date of Birth/Sex: Treating RN: Oct 08, 1950 (71 y.o. [name], [name] Primary Care Lariah Fleer: [name] MES Other Clinician: Referring Olden Klauer: Treating Jerrilynn Mikowski/Extender: [name], JA MES Weeks in Treatment: 2 Active Inactive Wound/Skin Impairment Nursing Diagnoses: Impaired tissue integrity Knowledge deficit related to ulceration/compromised skin integrity Goals: Patient will demonstrate a reduced rate of smoking or cessation of smoking Date Initiated: 01/23/2022 Target Resolution Date: 02/21/2022 Goal Status: Active Patient/caregiver will verbalize understanding of skin care regimen Date Initiated: 01/23/2022 Target Resolution Date: 02/24/2022 Goal Status: Active Ulcer/skin breakdown will have a volume reduction of 30% by week 4 Date Initiated: 01/23/2022 Target Resolution Date: 02/25/2022 Goal Status: Active Interventions: Assess patient/caregiver ability to obtain necessary supplies Assess patient/caregiver ability to perform ulcer/skin care regimen upon admission and as needed Assess ulceration(s) every visit Notes: Electronic Signature(s) Signed: 02/06/2022 11:57:18 AM By: Rhae Hammock RN Entered By: Rhae Hammock on 02/06/2022 11:17:19 -------------------------------------------------------------------------------- Pain Assessment Details Patient Name: Date of Service: [facility] RD, [name] 02/06/2022 10:45 A M Medical Record Number: [id number] Patient Account Number: [id number] Date of Birth/Sex: Treating RN: 1951/05/11 (71 y.o. [name], [name] Primary Care Maddisen Vought: [name] MES Other Clinician: Referring Derrel Moore: Treating Jakeia Carreras/Extender: [name], JA MES Weeks in Treatment: 2 Active Problems Location of Pain Severity and Description of Pain Patient Has Paino No Site Locations Pain Management and Medication Current Pain Management: Electronic Signature(s) Signed: 02/06/2022 11:57:18 AM By: Rhae Hammock RN Entered By: Rhae Hammock on 02/06/2022 10:50:22 -------------------------------------------------------------------------------- Patient/Caregiver Education Details Patient Name: Date of Service: [name], [name] 7/14/2023andnbsp10:45 A M Medical Record Number: [id number] Patient Account Number: [id number] Date of Birth/Gender: Treating RN: 1950/11/15 (71 y.o. [name] Primary Care Physician:  MCGHEE, JA MES Other Clinician: Referring Physician: Treating Physician/Extender: [name], JA MES Weeks in Treatment: 2 Education Assessment Education Provided To: Patient Education Topics Provided Wound/Skin Impairment: Methods: Explain/Verbal Responses: Reinforcements needed, State content correctly Electronic Signature(s) Signed: 02/06/2022 11:57:18 AM By: Rhae Hammock RN Entered By: Rhae Hammock on 02/06/2022 11:17:38 -------------------------------------------------------------------------------- Wound Assessment Details Patient Name: Date of Service: [facility] RD, [name] 02/06/2022 10:45 A M Medical Record Number: [id number] Patient Account Number: [id number] Date of Birth/Sex: Treating  RN: Apr 03, 1951 (71 y.o. [name], [name] Primary Care [place] Zou: [name] MES Other Clinician: Referring Omunique Pederson: Treating Esaias Cleavenger/Extender: [name], JA MES Weeks in Treatment: 2 Wound Status Wound Number: 5 Primary Pressure Ulcer Etiology: Wound Location: Left Calcaneus Wound Open Wounding Event: Blister Status: Date Acquired: 01/16/2022 Comorbid Anemia, Chronic Obstructive Pulmonary Disease (COPD), Weeks Of Treatment: 2 History: Congestive Heart Failure, Coronary Artery Disease, Hypertension, Clustered Wound: No Rheumatoid Arthritis, Paraplegia, Received Chemotherapy Photos Wound Measurements Length: (cm) 1 Width: (cm) 1.8 Depth: (cm) 0.1 Area: (cm) 1.414 Volume: (cm) 0.141 % Reduction in Area: 86.4% % Reduction in Volume: 86.4% Epithelialization: Small (1-33%) Tunneling: No Undermining: No Wound Description Classification: Category/Stage II Wound Margin: Distinct, outline attached Exudate Amount: Medium Exudate Type: Serosanguineous Exudate Color: red, brown Foul Odor After Cleansing: No Slough/Fibrino Yes Wound Bed Granulation Amount: Large (67-100%) Exposed Structure Granulation Quality: Red, Pink, Hyper-granulation Fascia Exposed: No Necrotic Amount: Small (1-33%) Fat Layer (Subcutaneous Tissue) Exposed: Yes Necrotic Quality: Adherent Slough Tendon Exposed: No Muscle Exposed: No Joint Exposed: No Bone Exposed: No Treatment Notes Wound #5 (Calcaneus) Wound Laterality: Left Cleanser Vashe 5.8 (oz) Discharge Instruction: Cleanse the wound with Vashe prior to applying a clean dressing using gauze sponges, not tissue or cotton balls. Peri-Wound Care Topical Primary Dressing Hydrofera Blue Ready Foam, 4x5 in Discharge Instruction: Apply to wound bed as instructed Secondary Dressing ABD Pad, 5x9 Discharge Instruction: Apply over primary dressing as directed. Woven Gauze Sponge, Non-Sterile 4x4 in Discharge Instruction: Apply over  primary dressing as directed. Secured With [employer], 4.5x3.1 (in/yd) Discharge Instruction: Secure with Kerlix as directed. 63M Medipore H Soft Cloth Surgical T ape, 4 x 10 (in/yd) Discharge Instruction: Secure with tape as directed. netting Compression Wrap Compression Stockings Add-Ons Electronic Signature(s) Signed: 02/06/2022 11:57:18 AM By: Rhae Hammock RN Entered By: Rhae Hammock on 02/06/2022 10:59:10 -------------------------------------------------------------------------------- Vitals Details Patient Name: Date of Service: [place], [name] 02/06/2022 10:45 A M Medical Record Number: [id number] Patient Account Number: [id number] Date of Birth/Sex: Treating RN: 16-May-1951 (71 y.o. [name], [name] Primary Care Veronica Fretz: [name] MES Other Clinician: Referring Ardean Melroy: Treating Ludivina Guymon/Extender: [name], JA MES Weeks in Treatment: 2 Vital Signs Time Taken: 10:48 Temperature (F): 98.7 Height (in): 65 Pulse (bpm): 74 Weight (lbs): 158 Respiratory Rate (breaths/min): 17 Body Mass Index (BMI): 26.3 Blood Pressure (mmHg): 99/66 Reference Range: 80 - 120 mg / dl Electronic Signature(s) Signed: 02/06/2022 11:57:18 AM By: Rhae Hammock RN Entered By: Rhae Hammock on 02/06/2022 10:50:14

## 2022-02-20 ENCOUNTER — Encounter (HOSPITAL_BASED_OUTPATIENT_CLINIC_OR_DEPARTMENT_OTHER): Payer: Medicare Other | Admitting: Internal Medicine

## 2022-02-20 DIAGNOSIS — G8220 Paraplegia, unspecified: Secondary | ICD-10-CM

## 2022-02-20 DIAGNOSIS — L89622 Pressure ulcer of left heel, stage 2: Secondary | ICD-10-CM
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
## 2022-02-26 NOTE — Progress Notes (Signed)
KABELLA, CASSIDY ([id number]) Visit Report for 02/20/2022 Arrival Information Details Patient Name: Date of Service: [name], [name] [name] B. 02/20/2022 10:45 A M Medical Record Number: [id number] Patient Account Number: [id number] Date of Birth/Sex: Treating RN: 1951/05/28 (71 y.o. [name] Primary Care Morgane Joerger: [name] MES Other Clinician: Referring Sachin Ferencz: Treating Torrence Hammack/Extender: [name], JA MES Weeks in Treatment: 4 Visit Information History Since Last Visit Added or deleted any medications: No Patient Arrived: Wheel Chair Any new allergies or adverse reactions: No Arrival Time: 11:15 Had a fall or experienced change in No Accompanied By: caregiver activities of daily living that may affect Transfer Assistance: None risk of falls: Patient Identification Verified: Yes Signs or symptoms of abuse/neglect since last visito No Secondary Verification Process Completed: Yes Hospitalized since last visit: No Patient Requires Transmission-Based Precautions: No Implantable device outside of the clinic excluding No Patient Has Alerts: No cellular tissue based products placed in the center since last visit: Has Dressing in Place as Prescribed: Yes Pain Present Now: No Electronic Signature(s) Signed: 02/20/2022 12:58:23 PM By: Sharyn Creamer RN, BSN Entered By: Sharyn Creamer on 02/20/2022 11:15:47 -------------------------------------------------------------------------------- Clinic Level of Care Assessment Details Patient Name: Date of Service: [place], [name] [name] B. 02/20/2022 10:45 A M Medical Record Number: [id number] Patient Account Number: [id number] Date of Birth/Sex: Treating RN: 1950/08/20 (71 y.o. [name] Primary Care Swetha Rayle: [name] MES Other Clinician: Referring Caldwell Kronenberger: Treating Dariyah Garduno/Extender: [name], JA MES Weeks in Treatment: 4 Clinic Level of Care Assessment Items TOOL 4 Quantity Score X- 1  0 Use when only an EandM is performed on FOLLOW-UP visit ASSESSMENTS - Nursing Assessment / Reassessment X- 1 10 Reassessment of Co-morbidities (includes updates in patient status) X- 1 5 Reassessment of Adherence to Treatment Plan ASSESSMENTS - Wound and Skin A ssessment / Reassessment X - Simple Wound Assessment / Reassessment - one wound 1 5 []  - 0 Complex Wound Assessment / Reassessment - multiple wounds []  - 0 Dermatologic / Skin Assessment (not related to wound area) ASSESSMENTS - Focused Assessment X- 1 5 Circumferential Edema Measurements - multi extremities []  - 0 Nutritional Assessment / Counseling / Intervention []  - 0 Lower Extremity Assessment (monofilament, tuning fork, pulses) []  - 0 Peripheral Arterial Disease Assessment (using hand held doppler) ASSESSMENTS - Ostomy and/or Continence Assessment and Care []  - 0 Incontinence Assessment and Management []  - 0 Ostomy Care Assessment and Management (repouching, etc.) PROCESS - Coordination of Care X - Simple Patient / Family Education for ongoing care 1 15 []  - 0 Complex (extensive) Patient / Family Education for ongoing care X- 1 10 Staff obtains Programmer, systems, Records, T Results / Process Orders est X- 1 10 Staff telephones HHA, Nursing Homes / Clarify orders / etc []  - 0 Routine Transfer to another Facility (non-emergent condition) []  - 0 Routine Hospital Admission (non-emergent condition) []  - 0 New Admissions / Biomedical engineer / Ordering NPWT Apligraf, etc. , []  - 0 Emergency Hospital Admission (emergent condition) []  - 0 Simple Discharge Coordination []  - 0 Complex (extensive) Discharge Coordination PROCESS - Special Needs []  - 0 Pediatric / Minor Patient Management []  - 0 Isolation Patient Management []  - 0 Hearing / Language / Visual special needs []  - 0 Assessment of Community assistance (transportation, D/C planning, etc.) []  - 0 Additional assistance / Altered mentation []  -  0 Support Surface(s) Assessment (bed, cushion, seat, etc.) INTERVENTIONS - Wound Cleansing / Measurement X - Simple Wound Cleansing - one wound  1 5 []  - 0 Complex Wound Cleansing - multiple wounds X- 1 5 Wound Imaging (photographs - any number of wounds) []  - 0 Wound Tracing (instead of photographs) X- 1 5 Simple Wound Measurement - one wound []  - 0 Complex Wound Measurement - multiple wounds INTERVENTIONS - Wound Dressings X - Small Wound Dressing one or multiple wounds 1 10 []  - 0 Medium Wound Dressing one or multiple wounds []  - 0 Large Wound Dressing one or multiple wounds []  - 0 Application of Medications - topical []  - 0 Application of Medications - injection INTERVENTIONS - Miscellaneous []  - 0 External ear exam []  - 0 Specimen Collection (cultures, biopsies, blood, body fluids, etc.) []  - 0 Specimen(s) / Culture(s) sent or taken to Lab for analysis []  - 0 Patient Transfer (multiple staff / Civil Service fast streamer / Similar devices) []  - 0 Simple Staple / Suture removal (25 or less) []  - 0 Complex Staple / Suture removal (26 or more) []  - 0 Hypo / Hyperglycemic Management (close monitor of Blood Glucose) []  - 0 Ankle / Brachial Index (ABI) - do not check if billed separately X- 1 5 Vital Signs Has the patient been seen at the hospital within the last three years: Yes Total Score: 90 Level Of Care: New/Established - Level 3 Electronic Signature(s) Signed: 02/20/2022 12:58:23 PM By: Sharyn Creamer RN, BSN Entered By: Sharyn Creamer on 02/20/2022 12:30:01 -------------------------------------------------------------------------------- Encounter Discharge Information Details Patient Name: Date of Service: [facility] RD, [name] [name] B. 02/20/2022 10:45 A M Medical Record Number: [id number] Patient Account Number: [id number] Date of Birth/Sex: Treating RN: 02-05-1951 (71 y.o. [name] Primary Care Holten Spano: [name] MES Other Clinician: Referring Fate Galanti: Treating  Shatoria Stooksbury/Extender: [name], JA MES Weeks in Treatment: 4 Encounter Discharge Information Items Discharge Condition: Stable Ambulatory Status: Wheelchair Discharge Destination: Home Transportation: Private Auto Accompanied By: caregiver Schedule Follow-up Appointment: Yes Clinical Summary of Care: Patient Declined Electronic Signature(s) Signed: 02/20/2022 12:58:23 PM By: Sharyn Creamer RN, BSN Entered By: Sharyn Creamer on 02/20/2022 12:31:17 -------------------------------------------------------------------------------- Lower Extremity Assessment Details Patient Name: Date of Service: [place], [name] [name] B. 02/20/2022 10:45 A M Medical Record Number: [id number] Patient Account Number: [id number] Date of Birth/Sex: Treating RN: 08-[name]-1951 (71 y.o. [name] Primary Care Gayl Ivanoff: [name] MES Other Clinician: Referring Galan Ghee: Treating Alyssamae Klinck/Extender: [name], JA MES Weeks in Treatment: 4 Edema Assessment Assessed: [Left: No] [Right: No] Edema: [Left: N] [Right: o] Calf Left: Right: Point of Measurement: 32 cm From Medial Instep 29.5 cm Ankle Left: Right: Point of Measurement: 10 cm From Medial Instep 19.5 cm Vascular Assessment Pulses: Dorsalis Pedis Palpable: [Left:Yes] Electronic Signature(s) Signed: 02/20/2022 12:58:23 PM By: Sharyn Creamer RN, BSN Entered By: Sharyn Creamer on 02/20/2022 11:26:51 -------------------------------------------------------------------------------- Multi Wound Chart Details Patient Name: Date of Service: [facility] RD, [name] [name] B. 02/20/2022 10:45 A M Medical Record Number: [id number] Patient Account Number: [id number] Date of Birth/Sex: Treating RN: [birth year] (71 y.o. [name], [name] Primary Care Darria Corvera: [name] MES Other Clinician: Referring Breena Bevacqua: Treating Izaih Kataoka/Extender: [name], JA MES Weeks in Treatment: 4 Vital Signs Height(in): 65 Pulse(bpm):  39 Weight(lbs): 158 Blood Pressure(mmHg): 101/70 Body Mass Index(BMI): 26.3 Temperature(F): 99.0 Respiratory Rate(breaths/min): 16 Photos: [N/A:N/A] Left Calcaneus N/A N/A Wound Location: Blister N/A N/A Wounding Event: Pressure Ulcer N/A N/A Primary Etiology: Anemia, Chronic Obstructive N/A N/A Comorbid History: Pulmonary Disease (COPD), Congestive Heart Failure, Coronary Artery Disease, Hypertension, Rheumatoid Arthritis, Paraplegia, Received Chemotherapy 01/16/2022 N/A N/A Date Acquired: 4  N/A N/A Weeks of Treatment: Open N/A N/A Wound Status: No N/A N/A Wound Recurrence: 1x0.8x0.1 N/A N/A Measurements L x W x D (cm) 0.628 N/A N/A A (cm) : rea 0.063 N/A N/A Volume (cm) : 93.90% N/A N/A % Reduction in A rea: 93.90% N/A N/A % Reduction in Volume: Category/Stage II N/A N/A Classification: Medium N/A N/A Exudate A mount: Serosanguineous N/A N/A Exudate Type: red, brown N/A N/A Exudate Color: Distinct, outline attached N/A N/A Wound Margin: Large (67-100%) N/A N/A Granulation A mount: Red, Pink N/A N/A Granulation Quality: Small (1-33%) N/A N/A Necrotic A mount: Fat Layer (Subcutaneous Tissue): Yes N/A N/A Exposed Structures: Fascia: No Tendon: No Muscle: No Joint: No Bone: No Large (67-100%) N/A N/A Epithelialization: Treatment Notes Electronic Signature(s) Signed: 02/20/2022 12:55:11 PM By: Kalman Shan DO Signed: 02/26/2022 3:56:15 PM By: Rhae Hammock RN Entered By: Kalman Shan on 02/20/2022 12:24:30 -------------------------------------------------------------------------------- Multi-Disciplinary Care Plan Details Patient Name: Date of Service: [facility] RD, [name] [name] B. 02/20/2022 10:45 A M Medical Record Number: [id number] Patient Account Number: [id number] Date of Birth/Sex: Treating RN: 19-Sep-1950 (71 y.o. [name] Primary Care Tawfiq Favila: [name] MES Other Clinician: Referring Romolo Sieling: Treating  Sevastian Witczak/Extender: [name], JA MES Weeks in Treatment: 4 Active Inactive Wound/Skin Impairment Nursing Diagnoses: Impaired tissue integrity Knowledge deficit related to ulceration/compromised skin integrity Goals: Patient will demonstrate a reduced rate of smoking or cessation of smoking Date Initiated: 01/23/2022 Target Resolution Date: 02/21/2022 Goal Status: Active Patient/caregiver will verbalize understanding of skin care regimen Date Initiated: 01/23/2022 Target Resolution Date: 02/24/2022 Goal Status: Active Ulcer/skin breakdown will have a volume reduction of 30% by week 4 Date Initiated: 01/23/2022 Target Resolution Date: 02/25/2022 Goal Status: Active Interventions: Assess patient/caregiver ability to obtain necessary supplies Assess patient/caregiver ability to perform ulcer/skin care regimen upon admission and as needed Assess ulceration(s) every visit Notes: Electronic Signature(s) Signed: 02/20/2022 12:58:23 PM By: Sharyn Creamer RN, BSN Entered By: Sharyn Creamer on 02/20/2022 11:27:43 -------------------------------------------------------------------------------- Pain Assessment Details Patient Name: Date of Service: [place], [name] [name] B. 02/20/2022 10:45 A M Medical Record Number: [id number] Patient Account Number: [id number] Date of Birth/Sex: Treating RN: 1950-11-29 (71 y.o. [name] Primary Care Jordane Hisle: [name] MES Other Clinician: Referring Benna Arno: Treating Foye Haggart/Extender: [name], JA MES Weeks in Treatment: 4 Active Problems Location of Pain Severity and Description of Pain Patient Has Paino No Site Locations Pain Management and Medication Current Pain Management: Electronic Signature(s) Signed: 02/20/2022 12:58:23 PM By: Sharyn Creamer RN, BSN Entered By: Sharyn Creamer on 02/20/2022 11:17:21 -------------------------------------------------------------------------------- Patient/Caregiver Education  Details Patient Name: Date of Service: [name], [name] [name] B. 7/28/2023andnbsp10:45 [place] Record Number: [id number] Patient Account Number: [id number] Date of Birth/Gender: Treating RN: 10-16-1950 (71 y.o. [name] Primary Care Physician: [name] MES Other Clinician: Referring Physician: Treating Physician/Extender: [name], JA MES Weeks in Treatment: 4 Education Assessment Education Provided To: Patient Education Topics Provided Wound/Skin Impairment: Methods: Explain/Verbal Responses: State content correctly Electronic Signature(s) Signed: 02/20/2022 12:58:23 PM By: Sharyn Creamer RN, BSN Entered By: Sharyn Creamer on 02/20/2022 11:27:57 -------------------------------------------------------------------------------- Wound Assessment Details Patient Name: Date of Service: [facility] RD, [name] [name] B. 02/20/2022 10:45 A M Medical Record Number: [id number] Patient Account Number: [id number] Date of Birth/Sex: Treating RN: [birth year] (71 y.o. [name] Primary Care Eliaz Fout: [name] MES Other Clinician: Referring Efraim Vanallen: Treating Khara Renaud/Extender: [name], JA MES Weeks in Treatment: 4 Wound Status Wound Number: 5 Primary Pressure Ulcer Etiology: Wound  Location: Left Calcaneus Wound Open Wounding Event: Blister Status: Date Acquired: 01/16/2022 Comorbid Anemia, Chronic Obstructive Pulmonary Disease (COPD), Weeks Of Treatment: 4 History: Congestive Heart Failure, Coronary Artery Disease, Hypertension, Clustered Wound: No Rheumatoid Arthritis, Paraplegia, Received Chemotherapy Photos Wound Measurements Length: (cm) 1 Width: (cm) 0.8 Depth: (cm) 0.1 Area: (cm) 0.628 Volume: (cm) 0.063 % Reduction in Area: 93.9% % Reduction in Volume: 93.9% Epithelialization: Large (67-100%) Tunneling: No Undermining: No Wound Description Classification: Category/Stage II Wound Margin: Distinct, outline attached Exudate  Amount: Medium Exudate Type: Serosanguineous Exudate Color: red, brown Foul Odor After Cleansing: No Slough/Fibrino Yes Wound Bed Granulation Amount: Large (67-100%) Exposed Structure Granulation Quality: Red, Pink Fascia Exposed: No Necrotic Amount: Small (1-33%) Fat Layer (Subcutaneous Tissue) Exposed: Yes Necrotic Quality: Adherent Slough Tendon Exposed: No Muscle Exposed: No Joint Exposed: No Bone Exposed: No Treatment Notes Wound #5 (Calcaneus) Wound Laterality: Left Cleanser Soap and Water Discharge Instruction: May shower and wash wound with dial antibacterial soap and water prior to dressing change. Vashe 5.8 (oz) Discharge Instruction: Cleanse the wound with Vashe prior to applying a clean dressing using gauze sponges, not tissue or cotton balls. Peri-Wound Care Topical Primary Dressing Hydrofera Blue Ready Foam, 4x5 in Discharge Instruction: Apply to wound bed as instructed Secondary Dressing ABD Pad, 5x9 Discharge Instruction: Apply over primary dressing as directed. Woven Gauze Sponge, Non-Sterile 4x4 in Discharge Instruction: Apply over primary dressing as directed. Secured With [employer], 4.5x3.1 (in/yd) Discharge Instruction: Secure with Kerlix as directed. 41M Medipore H Soft Cloth Surgical T ape, 4 x 10 (in/yd) Discharge Instruction: Secure with tape as directed. netting Compression Wrap Compression Stockings Add-Ons Electronic Signature(s) Signed: 02/20/2022 12:58:23 PM By: Sharyn Creamer RN, BSN Entered By: Sharyn Creamer on 02/20/2022 11:26:05 -------------------------------------------------------------------------------- Vitals Details Patient Name: Date of Service: [place], [name] [name] B. 02/20/2022 10:45 A M Medical Record Number: [id number] Patient Account Number: [id number] Date of Birth/Sex: Treating RN: 1950/09/11 (71 y.o. [name] Primary Care Topacio Cella: [name] MES Other Clinician: Referring Laurence Crofford: Treating  Deval Mroczka/Extender: [name], JA MES Weeks in Treatment: 4 Vital Signs Time Taken: 11:16 Temperature (F): 99.0 Height (in): 65 Pulse (bpm): 79 Weight (lbs): 158 Respiratory Rate (breaths/min): 16 Body Mass Index (BMI): 26.3 Blood Pressure (mmHg): 101/70 Reference Range: 80 - 120 mg / dl Electronic Signature(s) Signed: 02/20/2022 12:58:23 PM By: Sharyn Creamer RN, BSN Entered By: Sharyn Creamer on 02/20/2022 11:18:36

## 2022-02-27 ENCOUNTER — Encounter: Payer: Self-pay | Admitting: Physical Medicine and Rehabilitation

## 2022-02-27 ENCOUNTER — Encounter
Payer: Medicare Other | Attending: Physical Medicine and Rehabilitation | Admitting: Physical Medicine and Rehabilitation

## 2022-02-27 VITALS — BP 108/73 | HR 83 | Ht 65.0 in

## 2022-02-27 DIAGNOSIS — M24569 Contracture, unspecified knee: Secondary | ICD-10-CM | POA: Diagnosis present

## 2022-02-27 DIAGNOSIS — Z993 Dependence on wheelchair: Secondary | ICD-10-CM | POA: Diagnosis present

## 2022-02-27 DIAGNOSIS — G8220 Paraplegia, unspecified: Secondary | ICD-10-CM | POA: Diagnosis present

## 2022-02-27 DIAGNOSIS — K7460 Unspecified cirrhosis of liver: Secondary | ICD-10-CM | POA: Diagnosis present

## 2022-02-27 DIAGNOSIS — Z9359 Other cystostomy status: Secondary | ICD-10-CM | POA: Insufficient documentation
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
## 2022-03-03 ENCOUNTER — Other Ambulatory Visit: Payer: Self-pay | Admitting: Interventional Radiology

## 2022-03-03 DIAGNOSIS — R160 Hepatomegaly, not elsewhere classified: Secondary | ICD-10-CM

## 2022-03-06 ENCOUNTER — Encounter (HOSPITAL_BASED_OUTPATIENT_CLINIC_OR_DEPARTMENT_OTHER): Payer: Medicare Other | Attending: Internal Medicine | Admitting: Internal Medicine

## 2022-03-06 DIAGNOSIS — G8220 Paraplegia, unspecified: Secondary | ICD-10-CM | POA: Insufficient documentation

## 2022-03-06 DIAGNOSIS — L89622 Pressure ulcer of left heel, stage 2: Secondary | ICD-10-CM | POA: Insufficient documentation

## 2022-03-06 DIAGNOSIS — Z09 Encounter for follow-up examination after completed treatment for conditions other than malignant neoplasm: Secondary | ICD-10-CM | POA: Diagnosis not present

## 2022-03-13 NOTE — Progress Notes (Signed)
SOBIA, KARGER ([id number]) Visit Report for 03/06/2022 Arrival Information Details Patient Name: Date of Service: [place], [name] 03/06/2022 10:00 A M Medical Record Number: [id number] Patient Account Number: [id number] Date of Birth/Sex: Treating RN: 02/01/1951 (71 y.o. [name], [name] Primary Care Zillah Alexie: [name] MES Other Clinician: Referring Shearon Clonch: Treating Ko Bardon/Extender: [name], JA MES Weeks in Treatment: 6 Visit Information History Since Last Visit Added or deleted any medications: No Patient Arrived: Wheel Chair Any new allergies or adverse reactions: No Arrival Time: 10:17 Had a fall or experienced change in No Accompanied By: self activities of daily living that may affect Transfer Assistance: None risk of falls: Patient Identification Verified: Yes Signs or symptoms of abuse/neglect since last visito No Secondary Verification Process Completed: Yes Hospitalized since last visit: No Patient Requires Transmission-Based Precautions: No Implantable device outside of the clinic excluding No Patient Has Alerts: No cellular tissue based products placed in the center since last visit: Has Dressing in Place as Prescribed: Yes Pain Present Now: No Electronic Signature(s) Signed: 03/06/2022 12:35:31 PM By: Erenest Blank Entered By: Erenest Blank on 03/06/2022 10:17:55 -------------------------------------------------------------------------------- Clinic Level of Care Assessment Details Patient Name: Date of Service: [place], [name] 03/06/2022 10:00 A M Medical Record Number: [id number] Patient Account Number: [id number] Date of Birth/Sex: Treating RN: 1951-01-06 (71 y.o. [name], [name] Primary Care Isaah Furry: [name] MES Other Clinician: Referring Hibba Schram: Treating Gracyn Allor/Extender: [name], JA MES Weeks in Treatment: 6 Clinic Level of Care Assessment Items TOOL 4 Quantity Score X- 1 0 Use when  only an EandM is performed on FOLLOW-UP visit ASSESSMENTS - Nursing Assessment / Reassessment X- 1 10 Reassessment of Co-morbidities (includes updates in patient status) X- 1 5 Reassessment of Adherence to Treatment Plan ASSESSMENTS - Wound and Skin A ssessment / Reassessment X - Simple Wound Assessment / Reassessment - one wound 1 5 []  - 0 Complex Wound Assessment / Reassessment - multiple wounds []  - 0 Dermatologic / Skin Assessment (not related to wound area) ASSESSMENTS - Focused Assessment X- 1 5 Circumferential Edema Measurements - multi extremities []  - 0 Nutritional Assessment / Counseling / Intervention []  - 0 Lower Extremity Assessment (monofilament, tuning fork, pulses) []  - 0 Peripheral Arterial Disease Assessment (using hand held doppler) ASSESSMENTS - Ostomy and/or Continence Assessment and Care []  - 0 Incontinence Assessment and Management []  - 0 Ostomy Care Assessment and Management (repouching, etc.) PROCESS - Coordination of Care X - Simple Patient / Family Education for ongoing care 1 15 []  - 0 Complex (extensive) Patient / Family Education for ongoing care X- 1 10 Staff obtains Programmer, systems, Records, T Results / Process Orders est []  - 0 Staff telephones HHA, Nursing Homes / Clarify orders / etc []  - 0 Routine Transfer to another Facility (non-emergent condition) []  - 0 Routine Hospital Admission (non-emergent condition) []  - 0 New Admissions / Biomedical engineer / Ordering NPWT Apligraf, etc. , []  - 0 Emergency Hospital Admission (emergent condition) X- 1 10 Simple Discharge Coordination []  - 0 Complex (extensive) Discharge Coordination PROCESS - Special Needs []  - 0 Pediatric / Minor Patient Management []  - 0 Isolation Patient Management []  - 0 Hearing / Language / Visual special needs []  - 0 Assessment of Community assistance (transportation, D/C planning, etc.) []  - 0 Additional assistance / Altered mentation []  - 0 Support  Surface(s) Assessment (bed, cushion, seat, etc.) INTERVENTIONS - Wound Cleansing / Measurement X - Simple Wound Cleansing - one wound 1 5 []  -  0 Complex Wound Cleansing - multiple wounds X- 1 5 Wound Imaging (photographs - any number of wounds) []  - 0 Wound Tracing (instead of photographs) X- 1 5 Simple Wound Measurement - one wound []  - 0 Complex Wound Measurement - multiple wounds INTERVENTIONS - Wound Dressings X - Small Wound Dressing one or multiple wounds 1 10 []  - 0 Medium Wound Dressing one or multiple wounds []  - 0 Large Wound Dressing one or multiple wounds X- 1 5 Application of Medications - topical []  - 0 Application of Medications - injection INTERVENTIONS - Miscellaneous []  - 0 External ear exam []  - 0 Specimen Collection (cultures, biopsies, blood, body fluids, etc.) []  - 0 Specimen(s) / Culture(s) sent or taken to Lab for analysis []  - 0 Patient Transfer (multiple staff / Civil Service fast streamer / Similar devices) []  - 0 Simple Staple / Suture removal (25 or less) []  - 0 Complex Staple / Suture removal (26 or more) []  - 0 Hypo / Hyperglycemic Management (close monitor of Blood Glucose) []  - 0 Ankle / Brachial Index (ABI) - do not check if billed separately X- 1 5 Vital Signs Has the patient been seen at the hospital within the last three years: Yes Total Score: 95 Level Of Care: New/Established - Level 3 Electronic Signature(s) Signed: 03/06/2022 12:27:41 PM By: Rhae Hammock RN Entered By: Rhae Hammock on 03/06/2022 11:16:19 -------------------------------------------------------------------------------- Encounter Discharge Information Details Patient Name: Date of Service: [facility] [address], [name] 03/06/2022 10:00 A M Medical Record Number: [id number] Patient Account Number: [id number] Date of Birth/Sex: Treating RN: [birth year] (71 y.o. [name], [name] Primary Care Ayriana Wix: [name] MES Other Clinician: Referring Makeba Delcastillo: Treating  Bergen Magner/Extender: [name], JA MES Weeks in Treatment: 6 Encounter Discharge Information Items Discharge Condition: Stable Ambulatory Status: Wheelchair Discharge Destination: Home Transportation: Private Auto Accompanied By: self Schedule Follow-up Appointment: Yes Clinical Summary of Care: Patient Declined Electronic Signature(s) Signed: 03/06/2022 12:27:41 PM By: Rhae Hammock RN Entered By: Rhae Hammock on 03/06/2022 11:16:53 -------------------------------------------------------------------------------- Lower Extremity Assessment Details Patient Name: Date of Service: [place], [name] 03/06/2022 10:00 A M Medical Record Number: [id number] Patient Account Number: [id number] Date of Birth/Sex: Treating RN: 03/02/1951 (71 y.o. [name], [name] Primary Care Piero Mustard: [name] MES Other Clinician: Referring Renada Cronin: Treating Lio Wehrly/Extender: [name], JA MES Weeks in Treatment: 6 Edema Assessment Assessed: [Left: No] [Right: No] Edema: [Left: N] [Right: o] Calf Left: Right: Point of Measurement: 32 cm From Medial Instep 30.5 cm Ankle Left: Right: Point of Measurement: 10 cm From Medial Instep 21 cm Electronic Signature(s) Signed: 03/06/2022 12:27:41 PM By: Rhae Hammock RN Signed: 03/06/2022 12:35:31 PM By: Erenest Blank Entered By: Erenest Blank on 03/06/2022 10:33:28 -------------------------------------------------------------------------------- Multi Wound Chart Details Patient Name: Date of Service: [place], [name] 03/06/2022 10:00 A M Medical Record Number: [id number] Patient Account Number: [id number] Date of Birth/Sex: Treating RN: November 08, 1950 (71 y.o. [name], [name] Primary Care Mckynlie Vanderslice: [name] MES Other Clinician: Referring Aizah Gehlhausen: Treating Bohdi Leeds/Extender: [name], JA MES Weeks in Treatment: 6 Vital Signs Height(in): 65 Pulse(bpm): 78 Weight(lbs): 158 Blood  Pressure(mmHg): 122/74 Body Mass Index(BMI): 26.3 Temperature(F): 98.4 Respiratory Rate(breaths/min): 18 Photos: [N/A:N/A] Left Calcaneus N/A N/A Wound Location: Blister N/A N/A Wounding Event: Pressure Ulcer N/A N/A Primary Etiology: Anemia, Chronic Obstructive N/A N/A Comorbid History: Pulmonary Disease (COPD), Congestive Heart Failure, Coronary Artery Disease, Hypertension, Rheumatoid Arthritis, Paraplegia, Received Chemotherapy 01/16/2022 N/A N/A Date Acquired: 6 N/A N/A Weeks of Treatment: Open N/A  N/A Wound Status: No N/A N/A Wound Recurrence: 0.3x0.4x0.1 N/A N/A Measurements L x W x D (cm) 0.094 N/A N/A A (cm) : rea 0.009 N/A N/A Volume (cm) : 99.10% N/A N/A % Reduction in A rea: 99.10% N/A N/A % Reduction in Volume: Category/Stage II N/A N/A Classification: Medium N/A N/A Exudate A mount: Serosanguineous N/A N/A Exudate Type: red, brown N/A N/A Exudate Color: Distinct, outline attached N/A N/A Wound Margin: Large (67-100%) N/A N/A Granulation A mount: Red, Pink N/A N/A Granulation Quality: Small (1-33%) N/A N/A Necrotic A mount: Fat Layer (Subcutaneous Tissue): Yes N/A N/A Exposed Structures: Fascia: No Tendon: No Muscle: No Joint: No Bone: No Large (67-100%) N/A N/A Epithelialization: Treatment Notes Wound #5 (Calcaneus) Wound Laterality: Left Cleanser Soap and Water Discharge Instruction: May shower and wash wound with dial antibacterial soap and water prior to dressing change. Vashe 5.8 (oz) Discharge Instruction: Cleanse the wound with Vashe prior to applying a clean dressing using gauze sponges, not tissue or cotton balls. Peri-Wound Care Topical Primary Dressing Hydrofera Blue Ready Foam, 4x5 in Discharge Instruction: Apply to wound bed as instructed Secondary Dressing ABD Pad, 5x9 Discharge Instruction: Apply over primary dressing as directed. Woven Gauze Sponge, Non-Sterile 4x4 in Discharge Instruction: Apply over  primary dressing as directed. Secured With [employer], 4.5x3.1 (in/yd) Discharge Instruction: Secure with Kerlix as directed. 7M Medipore H Soft Cloth Surgical T ape, 4 x 10 (in/yd) Discharge Instruction: Secure with tape as directed. netting Compression Wrap Compression Stockings Add-Ons Electronic Signature(s) Signed: 03/06/2022 1:41:40 PM By: Kalman Shan DO Signed: 03/09/2022 4:24:40 PM By: Rhae Hammock RN Entered By: Kalman Shan on 03/06/2022 12:32:02 -------------------------------------------------------------------------------- Multi-Disciplinary Care Plan Details Patient Name: Date of Service: [place], [name] 03/06/2022 10:00 A M Medical Record Number: [id number] Patient Account Number: [id number] Date of Birth/Sex: Treating RN: 12/17/1950 (71 y.o. [name], [name] Primary Care Rhea Kaelin: [name] MES Other Clinician: Referring Imajean Mcdermid: Treating Leilanee Righetti/Extender: [name], JA MES Weeks in Treatment: 6 Active Inactive Wound/Skin Impairment Nursing Diagnoses: Impaired tissue integrity Knowledge deficit related to ulceration/compromised skin integrity Goals: Patient will demonstrate a reduced rate of smoking or cessation of smoking Date Initiated: 01/23/2022 Target Resolution Date: 03/28/2022 Goal Status: Active Patient/caregiver will verbalize understanding of skin care regimen Date Initiated: 01/23/2022 Target Resolution Date: 03/28/2022 Goal Status: Active Ulcer/skin breakdown will have a volume reduction of 30% by week 4 Date Initiated: 01/23/2022 Target Resolution Date: 03/28/2022 Goal Status: Active Interventions: Assess patient/caregiver ability to obtain necessary supplies Assess patient/caregiver ability to perform ulcer/skin care regimen upon admission and as needed Assess ulceration(s) every visit Notes: Electronic Signature(s) Signed: 03/06/2022 12:27:41 PM By: Rhae Hammock RN Entered By: Rhae Hammock on 03/06/2022 08:46:18 -------------------------------------------------------------------------------- Pain Assessment Details Patient Name: Date of Service: [place], [name] 03/06/2022 10:00 A M Medical Record Number: [id number] Patient Account Number: [id number] Date of Birth/Sex: Treating RN: [birth year] (71 y.o. [name], [name] Primary Care Claudius Mich: [name] MES Other Clinician: Referring Nyema Hachey: Treating Jayan Raymundo/Extender: [name], JA MES Weeks in Treatment: 6 Active Problems Location of Pain Severity and Description of Pain Patient Has Paino No Site Locations Pain Management and Medication Current Pain Management: Electronic Signature(s) Signed: 03/06/2022 12:27:41 PM By: Rhae Hammock RN Signed: 03/06/2022 12:35:31 PM By: Erenest Blank Entered By: Erenest Blank on 03/06/2022 10:20:48 -------------------------------------------------------------------------------- Patient/Caregiver Education Details Patient Name: Date of Service: [name], [name] 8/11/2023andnbsp10:00 [place] Record Number: [id number] Patient Account Number: [id number] Date of  Birth/Gender: Treating RN: July 11, 1951 (71 y.o. [name], [name] Primary Care Physician: [name] MES Other Clinician: Referring Physician: Treating Physician/Extender: [name], JA MES Weeks in Treatment: 6 Education Assessment Education Provided To: Patient Education Topics Provided Wound/Skin Impairment: Methods: Explain/Verbal Responses: Reinforcements needed, State content correctly Electronic Signature(s) Signed: 03/06/2022 12:27:41 PM By: Rhae Hammock RN Entered By: Rhae Hammock on 03/06/2022 08:46:33 -------------------------------------------------------------------------------- Wound Assessment Details Patient Name: Date of Service: [name], [name] 03/06/2022 10:00 A M Medical Record Number: [id number] Patient Account Number:  [id number] Date of Birth/Sex: Treating RN: Apr 29, 1951 (71 y.o. [name], [name] Primary Care Jeffery Gammell: [name] MES Other Clinician: Referring Kersti Scavone: Treating Wadie Mattie/Extender: [name], JA MES Weeks in Treatment: 6 Wound Status Wound Number: 5 Primary Pressure Ulcer Etiology: Wound Location: Left Calcaneus Wound Open Wounding Event: Blister Status: Date Acquired: 01/16/2022 Comorbid Anemia, Chronic Obstructive Pulmonary Disease (COPD), Weeks Of Treatment: 6 History: Congestive Heart Failure, Coronary Artery Disease, Hypertension, Clustered Wound: No Rheumatoid Arthritis, Paraplegia, Received Chemotherapy Photos Wound Measurements Length: (cm) 0.3 Width: (cm) 0.4 Depth: (cm) 0.1 Area: (cm) 0.094 Volume: (cm) 0.009 % Reduction in Area: 99.1% % Reduction in Volume: 99.1% Epithelialization: Large (67-100%) Tunneling: No Undermining: No Wound Description Classification: Category/Stage II Wound Margin: Distinct, outline attached Exudate Amount: Medium Exudate Type: Serosanguineous Exudate Color: red, brown Wound Bed Granulation Amount: Large (67-100%) Granulation Quality: Red, Pink Necrotic Amount: Small (1-33%) Foul Odor After Cleansing: No Slough/Fibrino Yes Exposed Structure Fascia Exposed: No Fat Layer (Subcutaneous Tissue) Exposed: Yes Tendon Exposed: No Muscle Exposed: No Joint Exposed: No Bone Exposed: No Treatment Notes Wound #5 (Calcaneus) Wound Laterality: Left Cleanser Soap and Water Discharge Instruction: May shower and wash wound with dial antibacterial soap and water prior to dressing change. Vashe 5.8 (oz) Discharge Instruction: Cleanse the wound with Vashe prior to applying a clean dressing using gauze sponges, not tissue or cotton balls. Peri-Wound Care Topical Primary Dressing Hydrofera Blue Ready Foam, 4x5 in Discharge Instruction: Apply to wound bed as instructed Secondary Dressing ABD Pad, 5x9 Discharge  Instruction: Apply over primary dressing as directed. Woven Gauze Sponge, Non-Sterile 4x4 in Discharge Instruction: Apply over primary dressing as directed. Secured With [employer], 4.5x3.1 (in/yd) Discharge Instruction: Secure with Kerlix as directed. 84M Medipore H Soft Cloth Surgical T ape, 4 x 10 (in/yd) Discharge Instruction: Secure with tape as directed. netting Compression Wrap Compression Stockings Add-Ons Electronic Signature(s) Signed: 03/06/2022 12:27:41 PM By: Rhae Hammock RN Signed: 03/06/2022 12:35:31 PM By: Erenest Blank Entered By: Erenest Blank on 03/06/2022 10:58:41 -------------------------------------------------------------------------------- Vitals Details Patient Name: Date of Service: [place], [name] 03/06/2022 10:00 A M Medical Record Number: [id number] Patient Account Number: [id number] Date of Birth/Sex: Treating RN: 05/21/1951 (71 y.o. [name], [name] Primary Care Harim Bi: [name] MES Other Clinician: Referring Rasheema Truluck: Treating Geraldy Akridge/Extender: [name], JA MES Weeks in Treatment: 6 Vital Signs Time Taken: 10:19 Temperature (F): 98.4 Height (in): 65 Pulse (bpm): 73 Weight (lbs): 158 Respiratory Rate (breaths/min): 18 Body Mass Index (BMI): 26.3 Blood Pressure (mmHg): 122/74 Reference Range: 80 - 120 mg / dl Electronic Signature(s) Signed: 03/06/2022 12:35:31 PM By: Erenest Blank Entered By: Erenest Blank on 03/06/2022 10:20:29
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
## 2022-03-20 ENCOUNTER — Encounter (HOSPITAL_BASED_OUTPATIENT_CLINIC_OR_DEPARTMENT_OTHER): Payer: Medicare Other | Admitting: Internal Medicine

## 2022-03-20 DIAGNOSIS — L89622 Pressure ulcer of left heel, stage 2: Secondary | ICD-10-CM

## 2022-03-20 DIAGNOSIS — Z09 Encounter for follow-up examination after completed treatment for conditions other than malignant neoplasm: Secondary | ICD-10-CM | POA: Diagnosis not present

## 2022-03-20 DIAGNOSIS — G8220 Paraplegia, unspecified: Secondary | ICD-10-CM | POA: Diagnosis not present
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
## 2022-03-27 NOTE — Progress Notes (Signed)
[name], [name] ([id number]) Visit Report for 03/20/2022 Arrival Information Details Patient Name: Date of Service: [name], [name] [name] B. 03/20/2022 10:45 A M Medical Record Number: [id number] Patient Account Number: [id number] Date of Birth/Sex: Treating RN: Apr 07, 1951 (71 y.o. [name], [name] Primary Care [name]: [name] MES Other Clinician: Referring [name]: Treating Shakevia Sarris/Extender: [name], JA MES Weeks in Treatment: 8 Visit Information History Since Last Visit Added or deleted any medications: No Patient Arrived: Wheel Chair Any new allergies or adverse reactions: No Arrival Time: 10:30 Had a fall or experienced change in No Accompanied By: self activities of daily living that may affect Transfer Assistance: None risk of falls: Patient Identification Verified: Yes Signs or symptoms of abuse/neglect since last visito No Secondary Verification Process Completed: Yes Hospitalized since last visit: No Patient Requires Transmission-Based Precautions: No Implantable device outside of the clinic excluding No Patient Has Alerts: No cellular tissue based products placed in the center since last visit: Has Dressing in Place as Prescribed: Yes Pain Present Now: No Notes stays in wheelchair Electronic Signature(s) Signed: 03/20/2022 5:57:00 PM By: [name], BSN Entered By: Deon Pilling on 03/20/2022 10:33:48 -------------------------------------------------------------------------------- Clinic Level of Care Assessment Details Patient Name: Date of Service: [name], [name] [name] B. 03/20/2022 10:45 A M Medical Record Number: [id number] Patient Account Number: [id number] Date of Birth/Sex: Treating RN: [birth year] (71 y.o. [name], [name] Primary Care [name]: [name] MES Other Clinician: Referring [name]: Treating [name]/Extender: [name], JA MES Weeks in Treatment: 8 Clinic Level of Care Assessment Items TOOL 4 Quantity  Score X- 1 0 Use when only an EandM is performed on FOLLOW-UP visit ASSESSMENTS - Nursing Assessment / Reassessment X- 1 10 Reassessment of Co-morbidities (includes updates in patient status) X- 1 5 Reassessment of Adherence to Treatment Plan ASSESSMENTS - Wound and Skin A ssessment / Reassessment X - Simple Wound Assessment / Reassessment - one wound 1 5 []  - 0 Complex Wound Assessment / Reassessment - multiple wounds X- 1 10 Dermatologic / Skin Assessment (not related to wound area) ASSESSMENTS - Focused Assessment []  - 0 Circumferential Edema Measurements - multi extremities []  - 0 Nutritional Assessment / Counseling / Intervention []  - 0 Lower Extremity Assessment (monofilament, tuning fork, pulses) []  - 0 Peripheral Arterial Disease Assessment (using hand held doppler) ASSESSMENTS - Ostomy and/or Continence Assessment and Care []  - 0 Incontinence Assessment and Management []  - 0 Ostomy Care Assessment and Management (repouching, etc.) PROCESS - Coordination of Care X - Simple Patient / Family Education for ongoing care 1 15 []  - 0 Complex (extensive) Patient / Family Education for ongoing care X- 1 10 Staff obtains Programmer, systems, Records, T Results / Process Orders est X- 1 10 Staff telephones HHA, Nursing Homes / Clarify orders / etc []  - 0 Routine Transfer to another Facility (non-emergent condition) []  - 0 Routine Hospital Admission (non-emergent condition) []  - 0 New Admissions / Biomedical engineer / Ordering NPWT Apligraf, etc. , []  - 0 Emergency Hospital Admission (emergent condition) X- 1 10 Simple Discharge Coordination []  - 0 Complex (extensive) Discharge Coordination PROCESS - Special Needs []  - 0 Pediatric / Minor Patient Management []  - 0 Isolation Patient Management []  - 0 Hearing / Language / Visual special needs []  - 0 Assessment of Community assistance (transportation, D/C planning, etc.) []  - 0 Additional assistance / Altered  mentation []  - 0 Support Surface(s) Assessment (bed, cushion, seat, etc.) INTERVENTIONS - Wound Cleansing / Measurement X - Simple Wound  Cleansing - one wound 1 5 []  - 0 Complex Wound Cleansing - multiple wounds X- 1 5 Wound Imaging (photographs - any number of wounds) []  - 0 Wound Tracing (instead of photographs) X- 1 5 Simple Wound Measurement - one wound []  - 0 Complex Wound Measurement - multiple wounds INTERVENTIONS - Wound Dressings []  - 0 Small Wound Dressing one or multiple wounds []  - 0 Medium Wound Dressing one or multiple wounds []  - 0 Large Wound Dressing one or multiple wounds []  - 0 Application of Medications - topical []  - 0 Application of Medications - injection INTERVENTIONS - Miscellaneous []  - 0 External ear exam []  - 0 Specimen Collection (cultures, biopsies, blood, body fluids, etc.) []  - 0 Specimen(s) / Culture(s) sent or taken to Lab for analysis []  - 0 Patient Transfer (multiple staff / Civil Service fast streamer / Similar devices) []  - 0 Simple Staple / Suture removal (25 or less) []  - 0 Complex Staple / Suture removal (26 or more) []  - 0 Hypo / Hyperglycemic Management (close monitor of Blood Glucose) []  - 0 Ankle / Brachial Index (ABI) - do not check if billed separately X- 1 5 Vital Signs Has the patient been seen at the hospital within the last three years: Yes Total Score: 95 Level Of Care: New/Established - Level 3 Electronic Signature(s) Signed: 03/20/2022 5:57:00 PM By: [name], BSN Entered By: Deon Pilling on 03/20/2022 10:47:17 -------------------------------------------------------------------------------- Encounter Discharge Information Details Patient Name: Date of Service: [facility] [name], [name] [name] B. 03/20/2022 10:45 A M Medical Record Number: [id number] Patient Account Number: [id number] Date of Birth/Sex: Treating RN: November 06, 1950 (71 y.o. [name] Primary Care [name]: [name] MES Other Clinician: Referring  Kerrilyn Azbill: Treating Da Authement/Extender: [name], JA MES Weeks in Treatment: 8 Encounter Discharge Information Items Discharge Condition: Stable Ambulatory Status: Wheelchair Discharge Destination: Home Transportation: Private Auto Accompanied By: self Schedule Follow-up Appointment: No Clinical Summary of Care: Electronic Signature(s) Signed: 03/20/2022 5:57:00 PM By: [name], BSN Entered By: Deon Pilling on 03/20/2022 10:47:37 -------------------------------------------------------------------------------- Lower Extremity Assessment Details Patient Name: Date of Service: [name], [name] [name] B. 03/20/2022 10:45 A M Medical Record Number: [id number] Patient Account Number: [id number] Date of Birth/Sex: Treating RN: 1950-08-02 (71 y.o. [name] Primary Care Prarthana Parlin: [name] MES Other Clinician: Referring Etheridge Geil: Treating Ladarien Beeks/Extender: [name], JA MES Weeks in Treatment: 8 Edema Assessment Assessed: [Left: Yes] [Right: No] Edema: [Left: N] [Right: o] Calf Left: Right: Point of Measurement: 32 cm From Medial Instep 30.5 cm Ankle Left: Right: Point of Measurement: 10 cm From Medial Instep 21 cm Vascular Assessment Pulses: Dorsalis Pedis Palpable: [Left:Yes] Electronic Signature(s) Signed: 03/20/2022 5:57:00 PM By: [name], BSN Entered By: Deon Pilling on 03/20/2022 10:41:35 -------------------------------------------------------------------------------- Multi Wound Chart Details Patient Name: Date of Service: [name], [name] [name] B. 03/20/2022 10:45 A M Medical Record Number: [id number] Patient Account Number: [id number] Date of Birth/Sex: Treating RN: 1951-01-12 (71 y.o. [name], [name] Primary Care Tyniya Kuyper: [name] MES Other Clinician: Referring Farryn Linares: Treating Kilian Schwartz/Extender: [name], JA MES Weeks in Treatment: 8 Vital Signs Height(in): 65 Pulse(bpm): 99 Weight(lbs):  158 Blood Pressure(mmHg): 98/67 Body Mass Index(BMI): 26.3 Temperature(F): 98.2 Respiratory Rate(breaths/min): 16 Photos: [N/A:N/A] Left Calcaneus N/A N/A Wound Location: Blister N/A N/A Wounding Event: Pressure Ulcer N/A N/A Primary Etiology: Anemia, Chronic Obstructive N/A N/A Comorbid History: Pulmonary Disease (COPD), Congestive Heart Failure, Coronary Artery Disease, Hypertension, Rheumatoid Arthritis, Paraplegia, Received Chemotherapy 01/16/2022 N/A N/A Date Acquired:  8 N/A N/A Weeks of Treatment: Open N/A N/A Wound Status: No N/A N/A Wound Recurrence: 0x0x0 N/A N/A Measurements L x W x D (cm) 0 N/A N/A A (cm) : rea 0 N/A N/A Volume (cm) : 100.00% N/A N/A % Reduction in A rea: 100.00% N/A N/A % Reduction in Volume: Category/Stage II N/A N/A Classification: None Present N/A N/A Exudate A mount: Distinct, outline attached N/A N/A Wound Margin: None Present (0%) N/A N/A Granulation A mount: None Present (0%) N/A N/A Necrotic A mount: Fascia: No N/A N/A Exposed Structures: Fat Layer (Subcutaneous Tissue): No Tendon: No Muscle: No Joint: No Bone: No Large (67-100%) N/A N/A Epithelialization: Treatment Notes Electronic Signature(s) Signed: 03/20/2022 11:19:55 AM By: Kalman Shan DO Signed: 03/27/2022 1:11:09 PM By: Rhae Hammock RN Entered By: Kalman Shan on 03/20/2022 11:15:29 -------------------------------------------------------------------------------- Multi-Disciplinary Care Plan Details Patient Name: Date of Service: [name], [name] [name] B. 03/20/2022 10:45 A M Medical Record Number: [id number] Patient Account Number: [id number] Date of Birth/Sex: Treating RN: 1951/01/28 (71 y.o. [name] Primary Care Eswin Worrell: [name] MES Other Clinician: Referring Cartha Rotert: Treating Jacaden Forbush/Extender: [name], JA MES Weeks in Treatment: 8 Active Inactive Electronic Signature(s) Signed: 03/20/2022 5:57:00 PM By:  [name], BSN Entered By: Deon Pilling on 03/20/2022 10:44:44 -------------------------------------------------------------------------------- Pain Assessment Details Patient Name: Date of Service: [name], [name] [name] B. 03/20/2022 10:45 A M Medical Record Number: [id number] Patient Account Number: [id number] Date of Birth/Sex: Treating RN: 02-28-1951 (71 y.o. [name] Primary Care Keshana Klemz: [name] MES Other Clinician: Referring Javoris Star: Treating Jaydalyn Demattia/Extender: [name], JA MES Weeks in Treatment: 8 Active Problems Location of Pain Severity and Description of Pain Patient Has Paino No Site Locations Rate the pain. Current Pain Level: 0 Pain Management and Medication Current Pain Management: Medication: No Cold Application: No Rest: No Massage: No Activity: No T.E.N.S.: No Heat Application: No Leg drop or elevation: No Is the Current Pain Management Adequate: Adequate How does your wound impact your activities of daily livingo Sleep: No Bathing: No Appetite: No Relationship With Others: No Bladder Continence: No Emotions: No Bowel Continence: No Work: No Toileting: No Drive: No Dressing: No Hobbies: No Engineer, maintenance) Signed: 03/20/2022 5:57:00 PM By: [name], BSN Entered By: Deon Pilling on 03/20/2022 10:34:56 -------------------------------------------------------------------------------- Wound Assessment Details Patient Name: Date of Service: [name], [name] [name] B. 03/20/2022 10:45 A M Medical Record Number: [id number] Patient Account Number: [id number] Date of Birth/Sex: Treating RN: Feb 16, 1951 (71 y.o. [name], [name] Primary Care Chaise Mahabir: [name] MES Other Clinician: Referring Orrin Yurkovich: Treating Rockelle Heuerman/Extender: [name], JA MES Weeks in Treatment: 8 Wound Status Wound Number: 5 Primary Pressure Ulcer Etiology: Wound Location: Left Calcaneus Wound Open Wounding Event:  Blister Status: Date Acquired: 01/16/2022 Comorbid Anemia, Chronic Obstructive Pulmonary Disease (COPD), Weeks Of Treatment: 8 History: Congestive Heart Failure, Coronary Artery Disease, Hypertension, Clustered Wound: No Rheumatoid Arthritis, Paraplegia, Received Chemotherapy Photos Wound Measurements Length: (cm) Width: (cm) Depth: (cm) Area: (cm) Volume: (cm) 0 % Reduction in Area: 100% 0 % Reduction in Volume: 100% 0 Epithelialization: Large (67-100%) 0 Tunneling: No 0 Undermining: No Wound Description Classification: Category/Stage II Wound Margin: Distinct, outline attached Exudate Amount: None Present Foul Odor After Cleansing: No Slough/Fibrino No Wound Bed Granulation Amount: None Present (0%) Exposed Structure Necrotic Amount: None Present (0%) Fascia Exposed: No Fat Layer (Subcutaneous Tissue) Exposed: No Tendon Exposed: No Muscle Exposed: No Joint Exposed: No Bone Exposed: No Electronic Signature(s) Signed: 03/20/2022 5:57:00 PM  By: [name], BSN Entered By: Deon Pilling on 03/20/2022 10:43:54 -------------------------------------------------------------------------------- Vitals Details Patient Name: Date of Service: [name], [name] [name] B. 03/20/2022 10:45 A M Medical Record Number: [id number] Patient Account Number: [id number] Date of Birth/Sex: Treating RN: 1951/01/17 (71 y.o. [name], [name] Primary Care Keita Demarco: [name] MES Other Clinician: Referring Lonzo Saulter: Treating Gasper Hopes/Extender: [name], JA MES Weeks in Treatment: 8 Vital Signs Time Taken: 10:30 Temperature (F): 98.2 Height (in): 65 Pulse (bpm): 99 Weight (lbs): 158 Respiratory Rate (breaths/min): 16 Body Mass Index (BMI): 26.3 Blood Pressure (mmHg): 98/67 Reference Range: 80 - 120 mg / dl Electronic Signature(s) Signed: 03/20/2022 5:57:00 PM By: [name], BSN Entered By: Deon Pilling on 03/20/2022 10:38:23

## 2022-04-09 ENCOUNTER — Ambulatory Visit (HOSPITAL_COMMUNITY): Payer: Medicare Other

## 2022-04-13 ENCOUNTER — Telehealth: Payer: Self-pay

## 2022-04-13 NOTE — Telephone Encounter (Signed)
Patient states having issues. Would like a call back from Dr. Dagoberto Ligas.

## 2022-04-14 ENCOUNTER — Telehealth: Payer: Medicare Other

## 2022-04-14 NOTE — Telephone Encounter (Signed)
Spoke w/ [name] she advised that she has been having numbness and tingling in her hand and the NP at the nursing home has prescribed her gabapentin which has not been helping and her hand is completely numb. They also wanted her to see a neurologist and she wanted to get Dr. Florentina Jenny opinion, advised that Dr. Dagoberto Ligas is Out of office until Monday and we will let Dr. Dagoberto Ligas know.

## 2022-04-21 ENCOUNTER — Ambulatory Visit (HOSPITAL_COMMUNITY)
Admission: RE | Admit: 2022-04-21 | Discharge: 2022-04-21 | Disposition: A | Discharge status: Home / Self Care | Payer: Medicare Other | Source: Ambulatory Visit | Attending: Interventional Radiology | Admitting: Interventional Radiology

## 2022-04-21 ENCOUNTER — Encounter (HOSPITAL_COMMUNITY): Payer: Self-pay

## 2022-04-21 DIAGNOSIS — R160 Hepatomegaly, not elsewhere classified: Secondary | ICD-10-CM | POA: Insufficient documentation

## 2022-04-21 DIAGNOSIS — N179 Acute kidney failure, unspecified: Secondary | ICD-10-CM | POA: Insufficient documentation

## 2022-04-21 LAB — POCT I-STAT CREATININE: Creatinine, Ser: 0.6 mg/dL (ref 0.44–1.00)

## 2022-04-21 MED ORDER — SODIUM CHLORIDE (PF) 0.9 % IJ SOLN
INTRAMUSCULAR | Status: AC
Start: 2022-04-21 — End: 2022-04-21
  Filled 2022-04-21: qty 50

## 2022-04-21 MED ORDER — IOHEXOL 300 MG/ML  SOLN
100.0000 mL | Freq: Once | INTRAMUSCULAR | Status: AC | PRN
Start: 2022-04-21 — End: 2022-04-21
  Administered 2022-04-21: 100 mL via INTRAVENOUS

## 2022-04-28 ENCOUNTER — Ambulatory Visit
Admission: RE | Admit: 2022-04-28 | Discharge: 2022-04-28 | Disposition: A | Discharge status: Home / Self Care | Payer: Medicare Other | Source: Ambulatory Visit | Attending: Interventional Radiology | Admitting: Interventional Radiology

## 2022-04-28 ENCOUNTER — Encounter: Payer: Self-pay | Admitting: Lab

## 2022-04-28 DIAGNOSIS — R160 Hepatomegaly, not elsewhere classified: Secondary | ICD-10-CM

## 2022-04-28 HISTORY — PX: IR RADIOLOGIST EVAL & MGMT: IMG5224
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
## 2022-04-29 NOTE — Telephone Encounter (Signed)
Can we schedule her for B/L UE [name]/NCS looking at carpal tunnel- thanks- ML

## 2022-05-15 ENCOUNTER — Inpatient Hospital Stay: Payer: Medicare Other | Admitting: Oncology

## 2022-05-20 ENCOUNTER — Encounter: Payer: Medicare Other | Admitting: Physical Medicine and Rehabilitation

## 2022-05-20 ENCOUNTER — Emergency Department (HOSPITAL_COMMUNITY)
Admission: EM | Admit: 2022-05-20 | Discharge: 2022-05-21 | Disposition: A | Discharge status: Home / Self Care | Payer: Medicare Other | Attending: Emergency Medicine | Admitting: Emergency Medicine

## 2022-05-20 ENCOUNTER — Encounter: Payer: Self-pay | Admitting: Physical Medicine and Rehabilitation

## 2022-05-20 ENCOUNTER — Other Ambulatory Visit: Payer: Self-pay

## 2022-05-20 ENCOUNTER — Telehealth: Payer: Self-pay

## 2022-05-20 ENCOUNTER — Emergency Department (HOSPITAL_COMMUNITY): Payer: Medicare Other

## 2022-05-20 ENCOUNTER — Encounter (HOSPITAL_COMMUNITY): Payer: Self-pay

## 2022-05-20 VITALS — BP 79/50 | HR 69 | Ht 65.0 in | Wt 165.0 lb

## 2022-05-20 DIAGNOSIS — R8290 Unspecified abnormal findings in urine: Secondary | ICD-10-CM | POA: Diagnosis not present

## 2022-05-20 DIAGNOSIS — R002 Palpitations: Secondary | ICD-10-CM | POA: Insufficient documentation

## 2022-05-20 DIAGNOSIS — R531 Weakness: Secondary | ICD-10-CM

## 2022-05-20 DIAGNOSIS — Z853 Personal history of malignant neoplasm of breast: Secondary | ICD-10-CM | POA: Insufficient documentation

## 2022-05-20 DIAGNOSIS — I951 Orthostatic hypotension: Secondary | ICD-10-CM

## 2022-05-20 DIAGNOSIS — J810 Acute pulmonary edema: Secondary | ICD-10-CM

## 2022-05-20 DIAGNOSIS — G8220 Paraplegia, unspecified: Secondary | ICD-10-CM | POA: Insufficient documentation

## 2022-05-20 DIAGNOSIS — Z8505 Personal history of malignant neoplasm of liver: Secondary | ICD-10-CM | POA: Insufficient documentation

## 2022-05-20 DIAGNOSIS — R0602 Shortness of breath: Secondary | ICD-10-CM | POA: Diagnosis present

## 2022-05-20 DIAGNOSIS — I509 Heart failure, unspecified: Secondary | ICD-10-CM | POA: Insufficient documentation

## 2022-05-20 LAB — URINALYSIS, ROUTINE W REFLEX MICROSCOPIC
Bilirubin Urine: NEGATIVE
Glucose, UA: NEGATIVE mg/dL
Ketones, ur: NEGATIVE mg/dL
Nitrite: NEGATIVE
Protein, ur: NEGATIVE mg/dL
Specific Gravity, Urine: 1.006 (ref 1.005–1.030)
pH: 5 (ref 5.0–8.0)

## 2022-05-20 LAB — CBC WITH DIFFERENTIAL/PLATELET
Abs Immature Granulocytes: 0.03 10*3/uL (ref 0.00–0.07)
Basophils Absolute: 0 10*3/uL (ref 0.0–0.1)
Basophils Relative: 1 %
Eosinophils Absolute: 0.2 10*3/uL (ref 0.0–0.5)
Eosinophils Relative: 2 %
HCT: 41 % (ref 36.0–46.0)
Hemoglobin: 12.9 g/dL (ref 12.0–15.0)
Immature Granulocytes: 0 %
Lymphocytes Relative: 24 %
Lymphs Abs: 1.9 10*3/uL (ref 0.7–4.0)
MCH: 25.6 pg — ABNORMAL LOW (ref 26.0–34.0)
MCHC: 31.5 g/dL (ref 30.0–36.0)
MCV: 81.5 fL (ref 80.0–100.0)
Monocytes Absolute: 0.6 10*3/uL (ref 0.1–1.0)
Monocytes Relative: 7 %
Neutro Abs: 5.4 10*3/uL (ref 1.7–7.7)
Neutrophils Relative %: 66 %
Platelets: 242 10*3/uL (ref 150–400)
RBC: 5.03 MIL/uL (ref 3.87–5.11)
RDW: 15.2 % (ref 11.5–15.5)
WBC: 8.1 10*3/uL (ref 4.0–10.5)
nRBC: 0 % (ref 0.0–0.2)

## 2022-05-20 LAB — BASIC METABOLIC PANEL
Anion gap: 12 (ref 5–15)
BUN: 20 mg/dL (ref 8–23)
CO2: 24 mmol/L (ref 22–32)
Calcium: 10 mg/dL (ref 8.9–10.3)
Chloride: 98 mmol/L (ref 98–111)
Creatinine, Ser: 0.75 mg/dL (ref 0.44–1.00)
GFR, Estimated: >60 mL/min (ref >60)
Glucose, Bld: 112 mg/dL — ABNORMAL HIGH (ref 70–99)
Potassium: 3.8 mmol/L (ref 3.5–5.1)
Sodium: 134 mmol/L — ABNORMAL LOW (ref 135–145)

## 2022-05-20 LAB — I-STAT VENOUS BLOOD GAS, ED
Acid-Base Excess: 0 mmol/L (ref 0.0–2.0)
Bicarbonate: 23.5 mmol/L (ref 20.0–28.0)
Calcium, Ion: 1.21 mmol/L (ref 1.15–1.40)
HCT: 40 % (ref 36.0–46.0)
Hemoglobin: 13.6 g/dL (ref 12.0–15.0)
O2 Saturation: 85 %
Potassium: 3.8 mmol/L (ref 3.5–5.1)
Sodium: 135 mmol/L (ref 135–145)
TCO2: 24 mmol/L (ref 22–32)
pCO2, Ven: 34.2 mmHg — ABNORMAL LOW (ref 44–60)
pH, Ven: 7.445 — ABNORMAL HIGH (ref 7.25–7.43)
pO2, Ven: 47 mmHg — ABNORMAL HIGH (ref 32–45)

## 2022-05-20 LAB — TROPONIN I (HIGH SENSITIVITY): Troponin I (High Sensitivity): 6 ng/L (ref <18)

## 2022-05-20 LAB — D-DIMER, QUANTITATIVE: D-Dimer, Quant: 1.1 ug/mL-FEU — ABNORMAL HIGH (ref 0.00–0.50)

## 2022-05-20 MED ORDER — IOHEXOL 350 MG/ML SOLN
75.0000 mL | Freq: Once | INTRAVENOUS | Status: AC | PRN
  Administered 2022-05-20: 75 mL via INTRAVENOUS

## 2022-05-20 MED ORDER — FUROSEMIDE 10 MG/ML IJ SOLN
40.0000 mg | Freq: Once | INTRAMUSCULAR | Status: AC
  Administered 2022-05-20: 40 mg via INTRAVENOUS
  Filled 2022-05-20: qty 4

## 2022-05-20 NOTE — Telephone Encounter (Signed)
Patient [name] will have to be rescheduled today. Patient reported not feeling yesterday or today.. Today Mrs. [name]'s blood pressure was low. Dr. Tressa Busman has advised [name] to report to the Emergency for evaluation.   The Triage Nurse at [facility] ED was informed. Patient will transported by her nursing facility transportation at 3:42 PM.  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

## 2022-05-20 NOTE — Progress Notes (Signed)
Patient presented for [name]/NCS bilateral [place] to evaluate for CTS. Unfortunately, intake vitals significant for BP 89/43. Patient endorsed 2 days of cognitive fatigue, lethargy, nausea, and symptomatic orthostasis. She has a Hx frequent UTIs, with similar symptoms, although this seems more severe than normal.  Reclined patient in power WC, repeat vitals BO 79/50. Discussed with patient, agreed with ER for evaluation due to concern for sepsis. Discussed calling EMS however some concern for being able to access her power wheelchair, so her driver will be transporting her instead. Called ER ahead to give report.  Patient to reschedule [name]/NCS once medically released form the hospital and feeling better.    Gertie Gowda, DO 05/20/2022

## 2022-05-20 NOTE — ED Triage Notes (Addendum)
Pt BIB [facility] from [facility] on [place], dizziness, lightheadedness, weakness, and nausea since yesterday at 3pm. Comes in waves. Pt denies pain. A&Ox4, equal strength bilaterally. Patient had suprapubic catheter changed yesterday prior to onset of symptoms.
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
## 2022-05-20 NOTE — ED Notes (Signed)
Patient O2 noted to drop to 87% during brief episode of sleeping/LOC.

## 2022-05-21 DIAGNOSIS — R0602 Shortness of breath: Secondary | ICD-10-CM | POA: Diagnosis not present

## 2022-05-21 LAB — BRAIN NATRIURETIC PEPTIDE: B Natriuretic Peptide: 15 pg/mL (ref 0.0–100.0)

## 2022-05-21 LAB — TROPONIN I (HIGH SENSITIVITY): Troponin I (High Sensitivity): 5 ng/L (ref <18)

## 2022-05-21 MED ORDER — ONDANSETRON HCL 4 MG/2ML IJ SOLN
4.0000 mg | Freq: Once | INTRAMUSCULAR | Status: AC
  Administered 2022-05-21: 4 mg via INTRAVENOUS
  Filled 2022-05-21: qty 2

## 2022-05-21 NOTE — ED Provider Notes (Signed)
I assumed care in signout.  CT chest is negative.  Patient's been sleeping and easily arousable.  She has brief drop in her pulse ox that completely improve when she wakes up.  When I review her chart, it appears that she was sent in for concern for orthostatic hypotension.  Here blood pressures have been maintaining above 100 almost consistently.  She is afebrile, no leukocytosis to suggest acute sepsis.  Her urine appears chronically infected, will send culture, but low suspicion for urosepsis at this time.  Patient denies any new pain, no focal abdominal tenderness.  Plan for discharge back to facility.  No indication for imaging at this time   Ripley Fraise, MD 05/21/22 [phone]

## 2022-05-21 NOTE — Discharge Instructions (Signed)

## 2022-05-21 NOTE — ED Notes (Signed)
Patient verbalizes understanding of d/c instructions. Opportunities for questions and answers were provided. Pt d/c from ED and transferred back to [facility] via [facility].

## 2022-05-22 LAB — URINE CULTURE: Culture: >=100000 — AB

## 2022-05-23 ENCOUNTER — Telehealth (HOSPITAL_BASED_OUTPATIENT_CLINIC_OR_DEPARTMENT_OTHER): Payer: Self-pay | Admitting: *Deleted

## 2022-05-23 NOTE — Telephone Encounter (Signed)
Post ED Visit - Positive Culture Follow-up  Culture report reviewed by antimicrobial stewardship pharmacist: [facility] Team []  Elenor Quinones, Pharm.D. []  Heide Guile, Pharm.D., BCPS AQ-ID []  Parks Neptune, Pharm.D., BCPS []  Alycia Rossetti, Pharm.D., BCPS []  [place], Pharm.D., BCPS, AAHIVP []  Legrand Como, Pharm.D., BCPS, AAHIVP []  Salome Arnt, PharmD, BCPS []  Johnnette Gourd, PharmD, BCPS []  Hughes Better, PharmD, BCPS []  Leeroy Cha, PharmD []  Laqueta Linden, PharmD, BCPS [x]  Roderic Ovens, PharmD  [facility] Team []  Leodis Sias, PharmD []  Lindell Spar, PharmD []  Royetta Asal, PharmD []  Graylin Shiver, Rph []  Rema Fendt) Glennon Mac, PharmD []  Arlyn Dunning, PharmD []  Netta Cedars, PharmD []  Dia Sitter, PharmD []  Leone Haven, PharmD []  Gretta Arab, PharmD []  Theodis Shove, PharmD []  Peggyann Juba, PharmD []  Reuel Boom, PharmD   Positive urine culture No urinary symptoms or concern for infection on workup, colonized, no treatment for UTI per Astrid Drafts, PA-C Rosie Fate 05/23/2022, 10:58 AM

## 2022-06-17 ENCOUNTER — Encounter
Payer: Medicare Other | Attending: Physical Medicine and Rehabilitation | Admitting: Physical Medicine and Rehabilitation

## 2022-06-17 ENCOUNTER — Encounter: Payer: Self-pay | Admitting: Physical Medicine and Rehabilitation

## 2022-06-17 VITALS — BP 96/62 | Ht 65.0 in | Wt 165.0 lb

## 2022-06-17 DIAGNOSIS — Z9359 Other cystostomy status: Secondary | ICD-10-CM | POA: Insufficient documentation

## 2022-06-17 DIAGNOSIS — Z993 Dependence on wheelchair: Secondary | ICD-10-CM | POA: Insufficient documentation

## 2022-06-17 DIAGNOSIS — G5600 Carpal tunnel syndrome, unspecified upper limb: Secondary | ICD-10-CM | POA: Diagnosis not present

## 2022-06-17 DIAGNOSIS — R252 Cramp and spasm: Secondary | ICD-10-CM | POA: Insufficient documentation

## 2022-06-17 DIAGNOSIS — G5603 Carpal tunnel syndrome, bilateral upper limbs: Secondary | ICD-10-CM

## 2022-06-17 DIAGNOSIS — G8220 Paraplegia, unspecified: Secondary | ICD-10-CM | POA: Insufficient documentation

## 2022-06-17 NOTE — Progress Notes (Signed)
Will fax your provider results of test within 2-3 business days.    Gertie Gowda, DO 06/17/2022

## 2022-06-22 ENCOUNTER — Encounter: Payer: Self-pay | Admitting: Physical Medicine and Rehabilitation

## 2022-06-22 ENCOUNTER — Encounter (HOSPITAL_BASED_OUTPATIENT_CLINIC_OR_DEPARTMENT_OTHER): Payer: Medicare Other | Admitting: Physical Medicine and Rehabilitation

## 2022-06-22 VITALS — BP 107/74 | HR 92 | Ht 65.0 in

## 2022-06-22 DIAGNOSIS — G8220 Paraplegia, unspecified: Secondary | ICD-10-CM

## 2022-06-22 DIAGNOSIS — Z9359 Other cystostomy status: Secondary | ICD-10-CM

## 2022-06-22 DIAGNOSIS — R252 Cramp and spasm: Secondary | ICD-10-CM | POA: Diagnosis not present

## 2022-06-22 DIAGNOSIS — Z993 Dependence on wheelchair: Secondary | ICD-10-CM | POA: Diagnosis not present

## 2022-06-22 DIAGNOSIS — G5600 Carpal tunnel syndrome, unspecified upper limb: Secondary | ICD-10-CM | POA: Diagnosis not present
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
## 2022-06-25 ENCOUNTER — Inpatient Hospital Stay: Payer: Medicare Other | Attending: Oncology | Admitting: Oncology

## 2022-06-25 VITALS — BP 104/74 | HR 69 | Temp 98.1°F | Resp 20 | Ht 65.0 in

## 2022-06-25 DIAGNOSIS — Z8619 Personal history of other infectious and parasitic diseases: Secondary | ICD-10-CM | POA: Insufficient documentation

## 2022-06-25 DIAGNOSIS — Z853 Personal history of malignant neoplasm of breast: Secondary | ICD-10-CM | POA: Diagnosis not present

## 2022-06-25 DIAGNOSIS — K219 Gastro-esophageal reflux disease without esophagitis: Secondary | ICD-10-CM | POA: Insufficient documentation

## 2022-06-25 DIAGNOSIS — K7460 Unspecified cirrhosis of liver: Secondary | ICD-10-CM | POA: Insufficient documentation

## 2022-06-25 DIAGNOSIS — C220 Liver cell carcinoma: Secondary | ICD-10-CM | POA: Insufficient documentation

## 2022-06-25 DIAGNOSIS — Z9013 Acquired absence of bilateral breasts and nipples: Secondary | ICD-10-CM | POA: Diagnosis not present

## 2022-06-25 DIAGNOSIS — Z79899 Other long term (current) drug therapy: Secondary | ICD-10-CM | POA: Insufficient documentation

## 2022-06-25 DIAGNOSIS — G8220 Paraplegia, unspecified: Secondary | ICD-10-CM | POA: Diagnosis not present

## 2022-06-25 DIAGNOSIS — F32A Depression, unspecified: Secondary | ICD-10-CM | POA: Diagnosis not present
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
## 2022-06-25 NOTE — Addendum Note (Signed)
Addended by: Tania Ade on: 06/25/2022 02:46 PM   Modules accepted: Orders

## 2022-06-29 ENCOUNTER — Encounter: Payer: Self-pay | Admitting: Physical Medicine and Rehabilitation

## 2022-07-14 ENCOUNTER — Ambulatory Visit: Payer: Medicare Other | Admitting: Family

## 2022-07-31 ENCOUNTER — Encounter: Payer: Self-pay | Admitting: Physical Medicine & Rehabilitation

## 2022-07-31 ENCOUNTER — Encounter: Payer: 59 | Attending: Physical Medicine and Rehabilitation | Admitting: Physical Medicine & Rehabilitation

## 2022-07-31 VITALS — BP 97/66 | HR 87

## 2022-07-31 DIAGNOSIS — R252 Cramp and spasm: Secondary | ICD-10-CM | POA: Diagnosis not present

## 2022-07-31 MED ORDER — ONABOTULINUMTOXINA 100 UNITS IJ SOLR
300.0000 [IU] | Freq: Once | INTRAMUSCULAR | Status: AC
  Administered 2022-07-31: 300 [IU] via INTRAMUSCULAR

## 2022-07-31 NOTE — Progress Notes (Signed)
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 This includes bleeding, bruising, infection, excessive weakness, or medication side effects. A REMS form is on file and signed. Needle: 25g 79m  needle electrode Number of units per muscle  Hamstrings medial 150U RIght and 150U Left All injections with [name] guidance and after negative drawback for blood.  Minimal active [name] activity was noted . The patient tolerated the procedure well. Post procedure instructions were given. A followup appointment was made.

## 2022-07-31 NOTE — Patient Instructions (Signed)

## 2022-08-04 ENCOUNTER — Encounter (HOSPITAL_BASED_OUTPATIENT_CLINIC_OR_DEPARTMENT_OTHER): Payer: Medicare Other | Admitting: Internal Medicine

## 2022-08-27 ENCOUNTER — Ambulatory Visit (HOSPITAL_BASED_OUTPATIENT_CLINIC_OR_DEPARTMENT_OTHER): Payer: Medicare Other | Admitting: Internal Medicine

## 2022-08-31 ENCOUNTER — Encounter: Payer: Self-pay | Admitting: Nurse Practitioner

## 2022-09-01 ENCOUNTER — Other Ambulatory Visit: Payer: Self-pay | Admitting: Nurse Practitioner

## 2022-09-01 DIAGNOSIS — R5381 Other malaise: Secondary | ICD-10-CM

## 2022-09-01 DIAGNOSIS — M810 Age-related osteoporosis without current pathological fracture: Secondary | ICD-10-CM

## 2022-10-02 ENCOUNTER — Encounter: Payer: Self-pay | Admitting: Physical Medicine and Rehabilitation

## 2022-10-02 ENCOUNTER — Encounter: Payer: 59 | Attending: Physical Medicine and Rehabilitation | Admitting: Physical Medicine and Rehabilitation

## 2022-10-02 VITALS — BP 86/57 | HR 83

## 2022-10-02 DIAGNOSIS — G8220 Paraplegia, unspecified: Secondary | ICD-10-CM | POA: Insufficient documentation

## 2022-10-02 DIAGNOSIS — R051 Acute cough: Secondary | ICD-10-CM | POA: Insufficient documentation

## 2022-10-02 DIAGNOSIS — R413 Other amnesia: Secondary | ICD-10-CM | POA: Insufficient documentation

## 2022-10-02 DIAGNOSIS — Z993 Dependence on wheelchair: Secondary | ICD-10-CM | POA: Diagnosis not present

## 2022-10-02 DIAGNOSIS — M24569 Contracture, unspecified knee: Secondary | ICD-10-CM | POA: Insufficient documentation

## 2022-10-02 DIAGNOSIS — G8221 Paraplegia, complete: Secondary | ICD-10-CM | POA: Insufficient documentation
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
## 2022-10-07 ENCOUNTER — Encounter: Payer: Self-pay | Admitting: Cardiology
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
## 2022-10-08 ENCOUNTER — Telehealth: Payer: Self-pay | Admitting: Cardiology

## 2022-10-08 ENCOUNTER — Encounter: Payer: Self-pay | Admitting: Cardiology

## 2022-10-08 ENCOUNTER — Ambulatory Visit: Payer: 59 | Attending: Cardiology | Admitting: Cardiology

## 2022-10-08 VITALS — BP 85/58 | HR 85 | Ht 65.0 in

## 2022-10-08 DIAGNOSIS — I5042 Chronic combined systolic (congestive) and diastolic (congestive) heart failure: Secondary | ICD-10-CM | POA: Diagnosis not present

## 2022-10-08 NOTE — Telephone Encounter (Signed)
Zenett from [facility] is requesting call back to get clarification on directions of medication

## 2022-10-08 NOTE — Patient Instructions (Addendum)
Medication Instructions:   Your physician recommends that you continue on your current medications as directed. Please refer to the Current Medication list given to you today.  *If you need a refill on your cardiac medications before your next appointment, please call your pharmacy*  Lab Work: NONE ordered at this time of appointment   If you have labs (blood work) drawn today and your tests are completely normal, you will receive your results only by: [place] (if you have MyChart) OR A paper copy in the mail If you have any lab test that is abnormal or we need to change your treatment, we will call you to review the results.  Testing/Procedures: NONE ordered at this time of appointment   Follow-Up: At [facility], you and your health needs are our priority.  As part of our continuing mission to provide you with exceptional heart care, we have created designated Provider Care Teams.  These Care Teams include your primary Cardiologist (physician) and Advanced Practice Providers (APPs -  Physician Assistants and Nurse Practitioners) who all work together to provide you with the care you need, when you need it.   Your next appointment:   1 year(s)  Provider:   Minus Breeding, MD     Other Instructions

## 2022-10-08 NOTE — Telephone Encounter (Signed)
Attempted to call [facility]. Was transferred to staff member but they could not assist and then transferred again to a generic VM. There was no info given on what med needed to be clarified. Will await return call.

## 2022-10-09 NOTE — Telephone Encounter (Signed)
Call and spoke with reception. She states Chrisandra Netters has left for the day but she will pass on the message to call our office.  [place] number given

## 2022-10-12 NOTE — Telephone Encounter (Signed)
Spoke with zenett, they are calling to confirm no medication changes were made at the 10/08/22 appointment. Confirmed no changes were made.

## 2022-10-15 ENCOUNTER — Telehealth: Payer: Self-pay | Admitting: *Deleted

## 2022-10-15 NOTE — Telephone Encounter (Signed)
Called facility to f/u on requested CMP results. Was informed it was not drawn and is unsure if they received the fax saying "the fax machine is hit or miss". She will have it drawn 3/22 as a stat and send results. Provided her with all information in regards to the CT scan on 3/25.

## 2022-10-16 ENCOUNTER — Encounter: Payer: Self-pay | Admitting: *Deleted

## 2022-10-16 LAB — COMPREHENSIVE METABOLIC PANEL: EGFR: 90

## 2022-10-16 NOTE — Progress Notes (Signed)
Notified by nurse, Zenett at [facility] that CMP was drawn today stat and the creatinine is 0.68. Has been trying to fax results today. Noted result on CT appointment note.

## 2022-10-19 ENCOUNTER — Ambulatory Visit (HOSPITAL_COMMUNITY)
Admission: RE | Admit: 2022-10-19 | Discharge: 2022-10-19 | Disposition: A | Discharge status: Home / Self Care | Payer: 59 | Source: Ambulatory Visit | Attending: Oncology | Admitting: Oncology

## 2022-10-19 DIAGNOSIS — C220 Liver cell carcinoma: Secondary | ICD-10-CM | POA: Diagnosis present

## 2022-10-19 MED ORDER — IOHEXOL 9 MG/ML PO SOLN
ORAL | Status: AC
  Filled 2022-10-19: qty 1000

## 2022-10-19 MED ORDER — IOHEXOL 350 MG/ML SOLN: 100.0000 mL | Freq: Once | INTRAVENOUS | Status: DC | PRN

## 2022-10-19 MED ORDER — SODIUM CHLORIDE (PF) 0.9 % IJ SOLN
INTRAMUSCULAR | Status: AC
  Filled 2022-10-19: qty 50

## 2022-10-19 MED ORDER — IOHEXOL 300 MG/ML  SOLN
100.0000 mL | Freq: Once | INTRAMUSCULAR | Status: AC | PRN
  Administered 2022-10-19: 100 mL via INTRAVENOUS

## 2022-10-21 ENCOUNTER — Encounter (HOSPITAL_BASED_OUTPATIENT_CLINIC_OR_DEPARTMENT_OTHER): Payer: 59 | Attending: General Surgery | Admitting: General Surgery

## 2022-10-21 DIAGNOSIS — K592 Neurogenic bowel, not elsewhere classified: Secondary | ICD-10-CM | POA: Insufficient documentation

## 2022-10-21 DIAGNOSIS — J449 Chronic obstructive pulmonary disease, unspecified: Secondary | ICD-10-CM | POA: Insufficient documentation

## 2022-10-21 DIAGNOSIS — Z87891 Personal history of nicotine dependence: Secondary | ICD-10-CM | POA: Diagnosis not present

## 2022-10-21 DIAGNOSIS — M069 Rheumatoid arthritis, unspecified: Secondary | ICD-10-CM | POA: Diagnosis not present

## 2022-10-21 DIAGNOSIS — Z993 Dependence on wheelchair: Secondary | ICD-10-CM | POA: Diagnosis not present

## 2022-10-21 DIAGNOSIS — B182 Chronic viral hepatitis C: Secondary | ICD-10-CM | POA: Diagnosis not present

## 2022-10-21 DIAGNOSIS — L89623 Pressure ulcer of left heel, stage 3: Secondary | ICD-10-CM | POA: Diagnosis not present

## 2022-10-21 DIAGNOSIS — Z9359 Other cystostomy status: Secondary | ICD-10-CM | POA: Insufficient documentation

## 2022-10-21 DIAGNOSIS — I2510 Atherosclerotic heart disease of native coronary artery without angina pectoris: Secondary | ICD-10-CM | POA: Insufficient documentation

## 2022-10-21 DIAGNOSIS — G909 Disorder of the autonomic nervous system, unspecified: Secondary | ICD-10-CM | POA: Insufficient documentation

## 2022-10-21 DIAGNOSIS — Z89431 Acquired absence of right foot: Secondary | ICD-10-CM | POA: Diagnosis not present

## 2022-10-21 DIAGNOSIS — G8220 Paraplegia, unspecified: Secondary | ICD-10-CM | POA: Diagnosis not present

## 2022-10-21 DIAGNOSIS — N189 Chronic kidney disease, unspecified: Secondary | ICD-10-CM | POA: Insufficient documentation

## 2022-10-21 DIAGNOSIS — I130 Hypertensive heart and chronic kidney disease with heart failure and stage 1 through stage 4 chronic kidney disease, or unspecified chronic kidney disease: Secondary | ICD-10-CM | POA: Diagnosis not present

## 2022-10-21 DIAGNOSIS — I5042 Chronic combined systolic (congestive) and diastolic (congestive) heart failure: Secondary | ICD-10-CM | POA: Insufficient documentation

## 2022-10-21 DIAGNOSIS — Z853 Personal history of malignant neoplasm of breast: Secondary | ICD-10-CM | POA: Insufficient documentation

## 2022-10-21 NOTE — Progress Notes (Signed)
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 Transportation Concerns: No Electronic Signature(s) Unsigned Entered By: [name] on 10/21/2022 12:41:25 -------------------------------------------------------------------------------- SuperBill Details Patient Name: Date of Service: [place], [name] 10/21/2022 Medical Record Number: [id number] Patient Account Number: [id number] Date of Birth/Sex: Treating RN: Oct 10, 1950 (72 y.o. 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 G90.9  Disorder of the autonomic nervous system, unspecified Facility Procedures : CPT4 Code: [id number] Description: [facility] VISIT-LEV 3 EST PT Modifier: 25 Quantity: 1 : CPT4 Code: [id number] Description: [id number] - DEB SUBQ TISSUE 20 SQ CM/< ICD-10 Diagnosis Description L89.623 Pressure ulcer of left heel, stage 3 Modifier: Quantity: 1 Physician Procedures : CPT4 Code Description Modifier [id number] - WC PHYS LEVEL 4 - EST PT 25 ICD-10 Diagnosis Description L89.623 Pressure ulcer of left heel, 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

## 2022-10-22 ENCOUNTER — Other Ambulatory Visit: Payer: Self-pay | Admitting: Interventional Radiology

## 2022-10-22 ENCOUNTER — Other Ambulatory Visit: Payer: Self-pay | Admitting: *Deleted

## 2022-10-22 DIAGNOSIS — C220 Liver cell carcinoma: Secondary | ICD-10-CM

## 2022-10-22 NOTE — Progress Notes (Signed)
FRAYA, BLASKO ([id number]) [id number] Page 1 of 4 Visit Report for 10/21/2022 Abuse Risk Screen Details Patient Name: Date of Service: [name], [name] 10/21/2022 12:30 PM Medical Record Number: [id number] Patient Account Number: [id number] Date of Birth/Sex: Treating RN: 1951-06-19 (72 y.o. Harlow Ohms Primary Care Avianah Pellman: [name] MES Other Clinician: Referring Terasa Orsini: Treating Ballard Budney/Extender: Maudie Flakes, JA MES Weeks in Treatment: 0 Abuse Risk Screen Items Answer ABUSE RISK SCREEN: Has anyone close to you tried to hurt or harm you recentlyo No Do you feel uncomfortable with anyone in your familyo No Has anyone forced you do things that you didnt want to doo No Electronic Signature(s) Signed: 10/21/2022 4:53:25 PM By: Adline Peals Entered By: Adline Peals on 10/21/2022 12:41:33 -------------------------------------------------------------------------------- Activities of Daily Living Details Patient Name: Date of Service: [name], [name] 10/21/2022 12:30 PM Medical Record Number: [id number] Patient Account Number: [id number] Date of Birth/Sex: Treating RN: January 24, 1951 (72 y.o. Harlow Ohms Primary Care Twisha Vanpelt: [name] MES Other Clinician: Referring Mattye Verdone: Treating Alyshia Kernan/Extender: Maudie Flakes, JA MES Weeks in Treatment: 0 Activities of Daily Living Items Answer Activities of Daily Living (Please select one for each item) Drive Automobile Not Able T Medications ake Completely Able Use T elephone Completely Able Care for Appearance Need Assistance Use T oilet Need Assistance Bath / Shower Need Assistance Dress Self Need Assistance Feed Self Completely Able Walk Not Able Get In / Out Bed Not [place] Need Assistance Shop for Self Need Assistance Electronic Signature(s) Signed: 10/21/2022 4:53:25 PM By:  Adline Peals Entered By: Adline Peals on 10/21/2022 12:42:10 -------------------------------------------------------------------------------- Education Screening Details Patient Name: Date of Service: [name], [name] 10/21/2022 12:30 PM Medical Record Number: [id number] Patient Account Number: [id number] Date of Birth/Sex: Treating RN: 02/09/1951 (72 y.o. Harlow Ohms Primary Care Kelseigh Diver: [name] MES Other Clinician: Referring Unique Sillas: Treating Trenell Concannon/Extender: Maudie Flakes, JA MES Weeks in TreatmentJANEIRA, LOUGHRAN ([id number]) [id number] Page 2 of 4 Primary Learner Assessed: Patient Learning Preferences/Education Level/Primary Language Learning Preference: Explanation, Demonstration, Video, Printed Material Highest Education Level: College or Above Preferred Language: Diplomatic Services operational officer Language Barrier: No Translator Needed: No Memory Deficit: No Emotional Barrier: No Cultural/Religious Beliefs Affecting Medical Care: No Physical Barrier Impaired Vision: Yes Glasses Impaired Hearing: No Decreased Hand dexterity: No Knowledge/Comprehension Knowledge Level: Medium Comprehension Level: Medium Ability to understand written instructions: Medium Ability to understand verbal instructions: Medium Motivation Anxiety Level: Calm Cooperation: Cooperative Education Importance: Acknowledges Need Interest in Health Problems: Asks Questions Perception: Coherent Willingness to Engage in Self-Management Medium Activities: Readiness to Engage in Self-Management Medium Activities: Electronic Signature(s) Signed: 10/21/2022 4:53:25 PM By: Adline Peals Entered By: Adline Peals on 10/21/2022 12:42:39 -------------------------------------------------------------------------------- Fall Risk Assessment Details Patient Name: Date of Service: CRA [place], [name] 10/21/2022 12:30  PM Medical Record Number: [id number] Patient Account Number: [id number] Date of Birth/Sex: Treating RN: 1951/01/25 (73 y.o. Harlow Ohms Primary Care Skylarr Liz: [name] MES Other Clinician: Referring Khara Renaud: Treating Rotunda Worden/Extender: Maudie Flakes, JA MES Weeks in Treatment: 0 Fall Risk Assessment Items Have you had 2 or more falls in the last 12 monthso 0 No Have you had any fall that resulted in injury in the last 12 monthso 0 No FALLS RISK SCREEN History of falling - immediate or within 3 months 0 No Secondary diagnosis (Do you have 2 or more medical diagnoseso) 0 No  Ambulatory aid None/bed rest/wheelchair/nurse 0 Yes Crutches/cane/walker 0 No Furniture 0 No Intravenous therapy Access/Saline/Heparin Lock 0 No Gait/Transferring Normal/ bed rest/ wheelchair 0 Yes Weak (short steps with or without shuffle, stooped but able to lift head while walking, may seek 0 No support from furniture) Impaired (short steps with shuffle, may have difficulty arising from chair, head down, impaired 0 No balance) Mental Status Oriented to own ability 0 Yes Overestimates or forgets limitations 0 No Risk Level: Low Risk Score: 0 [name], [name] ([id number]) [phone] [id number] Page 3 of 4 Electronic Signature(s) -------------------------------------------------------------------------------- Foot Assessment Details Patient Name: Date of Service: [name], [name] 10/21/2022 12:30 PM Medical Record Number: [id number] Patient Account Number: [id number] Date of Birth/Sex: Treating RN: [birth year] (72 y.o. Harlow Ohms Primary Care Keenen Roessner: [name] MES Other Clinician: Referring Johnika Escareno: Treating Fatou Dunnigan/Extender: Maudie Flakes, JA MES Weeks in Treatment: 0 Foot Assessment Items Site Locations + = Sensation present, - = Sensation absent, C = Callus, U = Ulcer R = Redness, W = Warmth, M = Maceration, PU = Pre-ulcerative  lesion F = Fissure, S = Swelling, D = Dryness Assessment Right: Left: Other Deformity: No No Prior Foot Ulcer: No No Prior Amputation: No No Charcot Joint: No No Ambulatory Status: Non-ambulatory Assistance Device: Wheelchair Gait: Notes paraplegia Electronic Signature(s) Signed: 10/21/2022 4:53:25 PM By: Adline Peals Entered By: Adline Peals on 10/21/2022 12:43:34 -------------------------------------------------------------------------------- Nutrition Risk Screening Details Patient Name: Date of Service: [place], [name] 10/21/2022 12:30 PM Medical Record Number: [id number] Patient Account Number: [id number] Date of Birth/Sex: Treating RN: [birth year] (72 y.o. Harlow Ohms Primary Care Johann Gascoigne: [name] MES Other Clinician: Referring David Rodriquez: Treating Irena Gaydos/Extender: Maudie Flakes, JA MES Weeks in Treatment: 0 Height (in): 65 Weight (lbs): 167 Body Mass Index (BMI): 27.8 [name], [name] ([id number]) [phone] [id number] Page 4 of 4 Nutrition Risk Screening Items Score Screening NUTRITION RISK SCREEN: I have an illness or condition that made me change the kind and/or amount of food I eat 0 No I eat fewer than two meals per day 3 Yes I eat few fruits and vegetables, or milk products 2 Yes I have three or more drinks of beer, liquor or wine almost every day 0 No I have tooth or mouth problems that make it hard for me to eat 0 No I don't always have enough money to buy the food I need 0 No I eat alone most of the time 0 No I take three or more different prescribed or over-the-counter drugs a day 1 Yes Without wanting to, I have lost or gained 10 pounds in the last six months 0 No I am not always physically able to shop, cook and/or feed myself 2 Yes Nutrition Protocols Good Risk Protocol Moderate Risk Protocol High Risk Proctocol 0 Provide education on nutrition Risk Level: High Risk Score: 8 Electronic  Signature(s) Signed: 10/21/2022 4:53:25 PM By: Adline Peals Entered By: Adline Peals on 10/21/2022 12:43:18

## 2022-10-22 NOTE — Progress Notes (Signed)
[name], [name] ([id number]) [id number] Page 1 of 8 Visit Report for 10/21/2022 Allergy List Details Patient Name: Date of Service: [name], [name]. 10/21/2022 12:30 PM Medical Record Number: [id number] Patient Account Number: [id number] Date of Birth/Sex: Treating RN: 12/25/1950 (72 y.o. Harlow Ohms Primary Care Morganne Haile: [name] MES Other Clinician: Referring Bryar Dahms: Treating Elva Breaker/Extender: Maudie Flakes, JA MES Weeks in Treatment: 0 Allergies Active Allergies No Known Allergies Allergy Notes Electronic Signature(s) Signed: 10/21/2022 4:53:25 PM By: Adline Peals Entered By: Adline Peals on 10/21/2022 12:40:58 -------------------------------------------------------------------------------- Arrival Information Details Patient Name: Date of Service: [facility] RD, [name]. 10/21/2022 12:30 PM Medical Record Number: [id number] Patient Account Number: [id number] Date of Birth/Sex: Treating RN: 1951-07-08 (72 y.o. Harlow Ohms Primary Care Taji Sather: [name] MES Other Clinician: Referring Brodrick Curran: Treating [name]/Extender: Maudie Flakes, JA MES Weeks in Treatment: 0 Visit Information Patient Arrived: Wheel Chair Arrival Time: 12:39 Accompanied By: self Transfer Assistance: None Patient Identification Verified: Yes Secondary Verification Process Completed: Yes History Since Last Visit Electronic Signature(s) Signed: 10/21/2022 4:53:25 PM By: Adline Peals Entered By: Adline Peals on 10/21/2022 12:40:33 -------------------------------------------------------------------------------- Clinic Level of Care Assessment Details Patient Name: Date of Service: [facility] [name] [address], [name]. 10/21/2022 12:30 PM Medical Record Number: [id number] Patient Account Number: [id number] Date of Birth/Sex: Treating RN: 23-Dec-1950 (72 y.o. Harlow Ohms Primary Care Kiwanna Spraker: [name] MES Other  Clinician: Referring Martel Galvan: Treating Raygen Dahm/Extender: Maudie Flakes, JA MES Weeks in Treatment: 0 Clinic Level of Care Assessment Items TOOL 1 Quantity Score X- 1 0 Use when EandM and Procedure is performed on INITIAL visit ASSESSMENTS - Nursing Assessment / Reassessment X- 1 20 General Physical Exam (combine w/ comprehensive assessment (listed just below) when performed on new pt. evals) X- 1 25 Comprehensive Assessment (HX, ROS, Risk Assessments, Wounds Hx, etc.) ASSESSMENTS - Wound and Skin Assessment / Reassessment []  - 0 Dermatologic / Skin Assessment (not related to wound area) [name], BEZNER [name] ([id number] Page 2 of 8 ASSESSMENTS - Ostomy and/or Continence Assessment and Care []  - 0 Incontinence Assessment and Management []  - 0 Ostomy Care Assessment and Management (repouching, etc.) PROCESS - Coordination of Care X - Simple Patient / Family Education for ongoing care 1 15 []  - 0 Complex (extensive) Patient / Family Education for ongoing care X- 1 10 Staff obtains Programmer, systems, Records, T Results / Process Orders est []  - 0 Staff telephones HHA, Nursing Homes / Clarify orders / etc []  - 0 Routine Transfer to another Facility (non-emergent condition) []  - 0 Routine Hospital Admission (non-emergent condition) X- 1 15 New Admissions / Biomedical engineer / Ordering NPWT Apligraf, etc. , []  - 0 Emergency Hospital Admission (emergent condition) PROCESS - Special Needs []  - 0 Pediatric / Minor Patient Management []  - 0 Isolation Patient Management []  - 0 Hearing / Language / Visual special needs []  - 0 Assessment of Community assistance (transportation, D/C planning, etc.) []  - 0 Additional assistance / Altered mentation []  - 0 Support Surface(s) Assessment (bed, cushion, seat, etc.) INTERVENTIONS - Miscellaneous []  - 0 External ear exam []  - 0 Patient Transfer (multiple staff / Civil Service fast streamer / Similar  devices) []  - 0 Simple Staple / Suture removal (25 or less) []  - 0 Complex Staple / Suture removal (26 or more) []  - 0 Hypo/Hyperglycemic Management (do not check if billed separately) X- 1 15 Ankle / Brachial Index (ABI) - do not check if billed separately Has the patient been seen  at the hospital within the last three years: Yes Total Score: 100 Level Of Care: New/Established - Level 3 Electronic Signature(s) Signed: 10/21/2022 4:53:25 PM By: Adline Peals Entered By: Adline Peals on 10/21/2022 13:06:47 -------------------------------------------------------------------------------- Encounter Discharge Information Details Patient Name: Date of Service: [name], [name]. 10/21/2022 12:30 PM Medical Record Number: [id number] Patient Account Number: [id number] Date of Birth/Sex: Treating RN: [birth year] (72 y.o. Harlow Ohms Primary Care Valeree Leidy: [name] MES Other Clinician: Referring Linzie Boursiquot: Treating Emelynn Rance/Extender: Maudie Flakes, JA MES Weeks in Treatment: 0 Encounter Discharge Information Items Post Procedure Vitals Discharge Condition: Stable Temperature (F): 97.9 Ambulatory Status: Wheelchair Pulse (bpm): 79 Discharge Destination: [place] Respiratory Rate (breaths/min): 16 Telephoned: No Blood Pressure (mmHg): 98/66 Orders Sent: Yes Transportation: Private Auto Accompanied By: self Schedule Follow-up Appointment: Yes Clinical Summary of Care: Patient DEVONN, [name] ([id number]) [id number] Page 3 of 8 Electronic Signature(s) Signed: 10/21/2022 4:53:25 PM By: Sabas Sous By: Adline Peals on 10/21/2022 13:40:57 -------------------------------------------------------------------------------- Lower Extremity Assessment Details Patient Name: Date of Service: [name], [name]. 10/21/2022 12:30 PM Medical Record Number: [id number] Patient Account Number:  [id number] Date of Birth/Sex: Treating RN: [birth year] (72 y.o. Harlow Ohms Primary Care Elainah Rhyne: [name] MES Other Clinician: Referring Jewelene Mairena: Treating Kymberlyn Eckford/Extender: Maudie Flakes, JA MES Weeks in Treatment: 0 Edema Assessment Assessed: [Left: No] [Right: No] [Left: Edema] [Right: :] Calf Left: Right: Point of Measurement: From Medial Instep 30 cm 30 cm Ankle Left: Right: Point of Measurement: From Medial Instep 21 cm 19.3 cm Vascular Assessment Pulses: Dorsalis Pedis Palpable: [Left:No] Blood Pressure: Brachial: [Left:98] Ankle: [Left:Dorsalis Pedis: 94 0.96] Electronic Signature(s) Signed: 10/21/2022 4:53:25 PM By: Adline Peals Entered By: Adline Peals on 10/21/2022 13:04:49 -------------------------------------------------------------------------------- Multi Wound Chart Details Patient Name: Date of Service: [facility] RD, [name]. 10/21/2022 12:30 PM Medical Record Number: [id number] Patient Account Number: [id number] Date of Birth/Sex: Treating RN: Dec 07, 1950 (72 y.o. F) Primary Care Donnesha Karg: [name] MES Other Clinician: Referring Balraj Brayfield: Treating Priscillia Fouch/Extender: Maudie Flakes, JA MES Weeks in Treatment: 0 Vital Signs Height(in): 65 Pulse(bpm): 71 Weight(lbs): 167 Blood Pressure(mmHg): 98/66 Body Mass Index(BMI): 27.8 Temperature(F): 97.9 Respiratory Rate(breaths/min): 16 [6:Photos:] [N/A:N/A] Left, Plantar Foot N/A N/A Wound Location: Blister N/A N/A Wounding Event: Pressure Ulcer N/A N/A Primary Etiology: Anemia, Chronic Obstructive N/A N/A Comorbid History: Pulmonary Disease (COPD), Congestive Heart Failure, Coronary Artery Disease, Hypertension, Hepatitis C, Rheumatoid Arthritis, Paraplegia, Received Chemotherapy 04/26/2022 N/A N/A Date Acquired: 0 N/A N/A Weeks of Treatment: Open N/A N/A Wound Status: No N/A N/A Wound Recurrence: 10.3x4.8x0.3 N/A N/A Measurements L x W x D  (cm) 38.83 N/A N/A A (cm) : rea 11.649 N/A N/A Volume (cm) : Category/Stage II N/A N/A Classification: Medium N/A N/A Exudate A mount: Serosanguineous N/A N/A Exudate Type: red, brown N/A N/A Exudate Color: Distinct, outline attached N/A N/A Wound Margin: Large (67-100%) N/A N/A Granulation A mount: Red, Pink N/A N/A Granulation Quality: Small (1-33%) N/A N/A Necrotic A mount: Fat Layer (Subcutaneous Tissue): Yes N/A N/A Exposed Structures: Fascia: No Tendon: No Muscle: No Joint: No Bone: No Small (1-33%) N/A N/A Epithelialization: Debridement - Excisional N/A N/A Debridement: Pre-procedure Verification/Time Out 13:20 N/A N/A Taken: Subcutaneous N/A N/A Tissue Debrided: Skin/Subcutaneous Tissue N/A N/A Level: 9 N/A N/A Debridement A (sq cm): rea Curette N/A N/A Instrument: Minimum N/A N/A Bleeding: Pressure N/A N/A Hemostasis A chieved: Procedure was tolerated well N/A N/A Debridement Treatment Response: 10.3x4.8x0.3 N/A N/A Post Debridement Measurements  L x W x D (cm) 11.649 N/A N/A Post Debridement Volume: (cm) Category/Stage II N/A N/A Post Debridement Stage: No Abnormalities Noted N/A N/A Periwound Skin Texture: No Abnormalities Noted N/A N/A Periwound Skin Moisture: No Abnormalities Noted N/A N/A Periwound Skin Color: No Abnormality N/A N/A Temperature: Debridement N/A N/A Procedures Performed: Treatment Notes Electronic Signature(s) Signed: 10/21/2022 1:33:49 PM By: Fredirick Maudlin MD FACS Entered By: Fredirick Maudlin on 10/21/2022 13:33:49 -------------------------------------------------------------------------------- Multi-Disciplinary Care Plan Details Patient Name: Date of Service: [name] RD, [name]. 10/21/2022 12:30 PM Medical Record Number: [id number] Patient Account Number: [id number] Date of Birth/Sex: Treating RN: 1951-06-08 (72 y.o. Harlow Ohms Primary Care Venisha Boehning: [name] MES Other Clinician: Referring  Krupa Stege: Treating Cartier Mapel/Extender: Maudie Flakes, JA MES Weeks in Treatment: 0 Active Inactive Nutrition Nursing Diagnoses: Imbalanced nutrition Potential for alteratiion in Nutrition/Potential for imbalanced nutrition KHOU, TUREAUD [name] ([id number] Page 5 of 8 Goals: Patient/caregiver agrees to and verbalizes understanding of need to use nutritional supplements and/or vitamins as prescribed Date Initiated: 10/21/2022 Target Resolution Date: 12/04/2022 Goal Status: Active Interventions: Assess patient nutrition upon admission and as needed per policy Notes: Wound/Skin Impairment Nursing Diagnoses: Impaired tissue integrity Knowledge deficit related to ulceration/compromised skin integrity Goals: Patient/caregiver will verbalize understanding of skin care regimen Date Initiated: 10/21/2022 Target Resolution Date: 12/04/2022 Goal Status: Active Interventions: Assess ulceration(s) every visit Treatment Activities: Skin care regimen initiated : 10/21/2022 Topical wound management initiated : 10/21/2022 Notes: Electronic Signature(s) Signed: 10/21/2022 4:53:25 PM By: Adline Peals Entered By: Adline Peals on 10/21/2022 13:06:08 -------------------------------------------------------------------------------- Pain Assessment Details Patient Name: Date of Service: [name] [address], [name]. 10/21/2022 12:30 PM Medical Record Number: [id number] Patient Account Number: [id number] Date of Birth/Sex: Treating RN: November 14, 1950 (72 y.o. Harlow Ohms Primary Care Hadi Dubin: [name] MES Other Clinician: Referring Yona Stansbury: Treating Tamana Hatfield/Extender: Maudie Flakes, JA MES Weeks in Treatment: 0 Active Problems Location of Pain Severity and Description of Pain Patient Has Paino No Site Locations Rate the pain. Current Pain Level: 0 Pain Management and Medication Current Pain Management: Electronic  Signature(s) KRISTARA, TARRO ([id number]) [id number] Page 6 of 8 Signed: 10/21/2022 4:53:25 PM By: Sabas Sous By: Adline Peals on 10/21/2022 12:59:10 -------------------------------------------------------------------------------- Patient/Caregiver Education Details Patient Name: Date of Service: [place], [name]. 3/27/2024andnbsp12:30 PM Medical Record Number: [id number] Patient Account Number: [id number] Date of Birth/Gender: Treating RN: November 18, 1950 (72 y.o. Harlow Ohms Primary Care Physician: [name] MES Other Clinician: Referring Physician: Treating Physician/Extender: Maudie Flakes, JA MES Weeks in Treatment: 0 Education Assessment Education Provided To: Patient Education Topics Provided Wound Debridement: Methods: Explain/Verbal Responses: Reinforcements needed, State content correctly Electronic Signature(s) Signed: 10/21/2022 4:53:25 PM By: Adline Peals Entered By: Adline Peals on 10/21/2022 13:06:22 -------------------------------------------------------------------------------- Wound Assessment Details Patient Name: Date of Service: [facility] RD, [name]. 10/21/2022 12:30 PM Medical Record Number: [id number] Patient Account Number: [id number] Date of Birth/Sex: Treating RN: 1951-04-26 (72 y.o. Harlow Ohms Primary Care Sarkis Rhines: [name] MES Other Clinician: Referring Armilda Vanderlinden: Treating Saavi Mceachron/Extender: Maudie Flakes, JA MES Weeks in Treatment: 0 Wound Status Wound Number: 6 Primary Pressure Ulcer Etiology: Wound Location: Left, Plantar Foot Wound Open Wounding Event: Blister Status: Date Acquired: 04/26/2022 Comorbid Anemia, Chronic Obstructive Pulmonary Disease (COPD), Weeks Of Treatment: 0 History: Congestive Heart Failure, Coronary Artery Disease, Hypertension, Clustered Wound: No Hepatitis C, Rheumatoid Arthritis, Paraplegia,  Received Chemotherapy Photos Wound Measurements Length: (cm) 10.3 Width: (cm) 4.8 Depth: (cm) 0.3 Area: (cm) 38.83 Volume: (cm)  11.649 ZYAIRAH, NIKOLICH ([id number]) Wound Description Classification: Category/Stage II Wound Margin: Distinct, outline attached Exudate Amount: Medium Exudate Type: Serosanguineous Exudate Color: red, brown Foul Odor After Cleansing: No Slough/Fibrino Yes % Reduction in Area: % Reduction in Volume: Epithelialization: Small (1-33%) Tunneling: No Undermining: No [id number] Page 7 of 8 Wound Bed Granulation Amount: Large (67-100%) Exposed Structure Granulation Quality: Red, Pink Fascia Exposed: No Necrotic Amount: Small (1-33%) Fat Layer (Subcutaneous Tissue) Exposed: Yes Tendon Exposed: No Muscle Exposed: No Joint Exposed: No Bone Exposed: No Periwound Skin Texture Texture Color No Abnormalities Noted: Yes No Abnormalities Noted: Yes Moisture Temperature / Pain No Abnormalities Noted: Yes Temperature: No Abnormality Treatment Notes Wound #6 (Foot) Wound Laterality: Plantar, Left Cleanser Soap and Water Discharge Instruction: May shower and wash wound with dial antibacterial soap and water prior to dressing change. Wound Cleanser Discharge Instruction: Cleanse the wound with wound cleanser prior to applying a clean dressing using gauze sponges, not tissue or cotton balls. Peri-Wound Care Topical Primary Dressing Maxorb Extra Ag+ Alginate Dressing, 4x4.75 (in/in) Discharge Instruction: Apply to wound bed as instructed Secondary Dressing ALLEVYN Heel 4 1/2in x 5 1/2in / 10.5cm x 13.5cm Discharge Instruction: Apply over primary dressing as directed. Woven Gauze Sponge, Non-Sterile 4x4 in Discharge Instruction: Apply over primary dressing as directed. Secured With [employer], 4.5x3.1 (in/yd) Discharge Instruction: Secure with Kerlix as directed. Surgilast Tubular Stretch Net Dressing, Latex-free,  Size 3, Medium-Hand, Arm, Leg, Foot Compression Wrap Compression Stockings Add-Ons Electronic Signature(s) Signed: 10/21/2022 4:53:25 PM By: Adline Peals Entered By: Adline Peals on 10/21/2022 13:20:12 -------------------------------------------------------------------------------- Vitals Details Patient Name: Date of Service: [place], [name]. 10/21/2022 12:30 PM Medical Record Number: [id number] Patient Account Number: [id number] Date of Birth/Sex: Treating RN: Oct 26, 1950 (72 y.o. Harlow Ohms Primary Care Audiel Scheiber: [name] MES Other Clinician: Referring Kyna Blahnik: Treating Keontay Vora/Extender: Maudie Flakes, JA MES Weeks in Treatment: 0 Vital Signs DESI, FRED ([id number]) [id number] Page 8 of 8 Time Taken: 12:40 Temperature (F): 97.9 Height (in): 65 Pulse (bpm): 79 Source: Stated Respiratory Rate (breaths/min): 16 Weight (lbs): 167 Blood Pressure (mmHg): 98/66 Source: Stated Reference Range: 80 - 120 mg / dl Body Mass Index (BMI): 27.8 Electronic Signature(s) Signed: 10/21/2022 4:53:25 PM By: Adline Peals Entered By: Adline Peals on 10/21/2022 12:40:53

## 2022-10-28 ENCOUNTER — Encounter (HOSPITAL_BASED_OUTPATIENT_CLINIC_OR_DEPARTMENT_OTHER): Payer: 59 | Attending: General Surgery | Admitting: General Surgery

## 2022-10-28 DIAGNOSIS — G909 Disorder of the autonomic nervous system, unspecified: Secondary | ICD-10-CM | POA: Insufficient documentation

## 2022-10-28 DIAGNOSIS — I5042 Chronic combined systolic (congestive) and diastolic (congestive) heart failure: Secondary | ICD-10-CM | POA: Diagnosis not present

## 2022-10-28 DIAGNOSIS — Z89431 Acquired absence of right foot: Secondary | ICD-10-CM | POA: Diagnosis not present

## 2022-10-28 DIAGNOSIS — B182 Chronic viral hepatitis C: Secondary | ICD-10-CM | POA: Diagnosis not present

## 2022-10-28 DIAGNOSIS — I2510 Atherosclerotic heart disease of native coronary artery without angina pectoris: Secondary | ICD-10-CM | POA: Insufficient documentation

## 2022-10-28 DIAGNOSIS — N189 Chronic kidney disease, unspecified: Secondary | ICD-10-CM | POA: Insufficient documentation

## 2022-10-28 DIAGNOSIS — Z993 Dependence on wheelchair: Secondary | ICD-10-CM | POA: Diagnosis not present

## 2022-10-28 DIAGNOSIS — G8220 Paraplegia, unspecified: Secondary | ICD-10-CM | POA: Diagnosis not present

## 2022-10-28 DIAGNOSIS — J449 Chronic obstructive pulmonary disease, unspecified: Secondary | ICD-10-CM | POA: Insufficient documentation

## 2022-10-28 DIAGNOSIS — I130 Hypertensive heart and chronic kidney disease with heart failure and stage 1 through stage 4 chronic kidney disease, or unspecified chronic kidney disease: Secondary | ICD-10-CM | POA: Insufficient documentation

## 2022-10-28 DIAGNOSIS — M069 Rheumatoid arthritis, unspecified: Secondary | ICD-10-CM | POA: Diagnosis not present

## 2022-10-28 DIAGNOSIS — K592 Neurogenic bowel, not elsewhere classified: Secondary | ICD-10-CM | POA: Diagnosis not present

## 2022-10-28 DIAGNOSIS — L89623 Pressure ulcer of left heel, stage 3: Secondary | ICD-10-CM | POA: Insufficient documentation
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
## 2022-10-29 ENCOUNTER — Ambulatory Visit
Admission: RE | Admit: 2022-10-29 | Discharge: 2022-10-29 | Disposition: A | Discharge status: Home / Self Care | Payer: Medicaid Other | Source: Ambulatory Visit | Attending: Interventional Radiology | Admitting: Interventional Radiology

## 2022-10-29 DIAGNOSIS — C220 Liver cell carcinoma: Secondary | ICD-10-CM

## 2022-10-29 HISTORY — PX: IR RADIOLOGIST EVAL & MGMT: IMG5224
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
## 2022-10-29 NOTE — Progress Notes (Signed)
GRAVIELA, SHUBIN ([id number]) [id number] Page 1 of 6 Visit Report for 10/28/2022 Arrival Information Details Patient Name: Date of Service: [place], [name] 10/28/2022 12:45 PM Medical Record Number: [id number] Patient Account Number: [id number] Date of Birth/Sex: Treating RN: Feb 02, 1951 (72 y.o. [name] Primary Care Fontaine Kossman: [name] MES Other Clinician: Referring Kylie Gros: Treating Gavyn Zoss/Extender: Maudie Flakes, JA MES Weeks in Treatment: 1 Visit Information History Since Last Visit Added or deleted any medications: No Patient Arrived: Wheel Chair Any new allergies or adverse reactions: No Arrival Time: 12:55 Had a fall or experienced change in No Accompanied By: self activities of daily living that may affect Transfer Assistance: Manual risk of falls: Patient Identification Verified: Yes Signs or symptoms of abuse/neglect since last visito No Hospitalized since last visit: No Implantable device outside of the clinic excluding No cellular tissue based products placed in the center since last visit: Has Dressing in Place as Prescribed: Yes Pain Present Now: No Electronic Signature(s) Signed: 10/28/2022 5:56:13 PM By: Dellie Catholic RN Entered By: Dellie Catholic on 10/28/2022 13:07:11 -------------------------------------------------------------------------------- Encounter Discharge Information Details Patient Name: Date of Service: [place], [name] 10/28/2022 12:45 PM Medical Record Number: [id number] Patient Account Number: [id number] Date of Birth/Sex: Treating RN: 1951-07-01 (72 y.o. [name] Primary Care Hannah Crill: [name] MES Other Clinician: Referring Damarri Rampy: Treating Jerlene Rockers/Extender: Maudie Flakes, JA MES Weeks in Treatment: 1 Encounter Discharge Information Items Discharge Condition: Stable Ambulatory Status: Wheelchair Discharge Destination: Home Transportation: Private  Auto Accompanied By: self Schedule Follow-up Appointment: Yes Clinical Summary of Care: Patient Declined Electronic Signature(s) Signed: 10/28/2022 5:56:13 PM By: Dellie Catholic RN Entered By: Dellie Catholic on 10/28/2022 17:54:52 -------------------------------------------------------------------------------- Lower Extremity Assessment Details Patient Name: Date of Service: [place], [name] 10/28/2022 12:45 PM Medical Record Number: [id number] Patient Account Number: [id number] Date of Birth/Sex: Treating RN: [birth year] (72 y.o. [name] Primary Care Nathanyel Defenbaugh: [name] MES Other Clinician: Referring Lindberg Zenon: Treating Patrece Tallie/Extender: Maudie Flakes, JA MES Weeks in Treatment: 1 Edema Assessment Assessed: [Left: No] [Right: No] C[Left: RAWFORD, Pilar B ([id number] [[id number] Page 2 of 6] [Left: Edema] [Right: :] Calf Left: Right: Point of Measurement: From Medial Instep 30 cm 30 cm Ankle Left: Right: Point of Measurement: From Medial Instep 21 cm 19.3 cm Vascular Assessment Pulses: Dorsalis Pedis Palpable: [Left:Yes] Electronic Signature(s) Signed: 10/28/2022 5:56:13 PM By: Dellie Catholic RN Entered By: Dellie Catholic on 10/28/2022 13:12:02 -------------------------------------------------------------------------------- Multi Wound Chart Details Patient Name: Date of Service: [place], [name] 10/28/2022 12:45 PM Medical Record Number: [id number] Patient Account Number: [id number] Date of Birth/Sex: Treating RN: July 08, 1951 (72 y.o. F) Primary Care Kayliee Atienza: [name] MES Other Clinician: Referring Reis Pienta: Treating Lisel Siegrist/Extender: Maudie Flakes, JA MES Weeks in Treatment: 1 Vital Signs Height(in): 65 Pulse(bpm): Weight(lbs): 167 Blood Pressure(mmHg): Body Mass Index(BMI): 27.8 Temperature(F): Respiratory Rate(breaths/min): 18 [6:Photos:] [N/A:N/A] Left, Plantar Foot N/A N/A Wound  Location: Blister N/A N/A Wounding Event: Pressure Ulcer N/A N/A Primary Etiology: Anemia, Chronic Obstructive N/A N/A Comorbid History: Pulmonary Disease (COPD), Congestive Heart Failure, Coronary Artery Disease, Hypertension, Hepatitis C, Rheumatoid Arthritis, Paraplegia, Received Chemotherapy 04/26/2022 N/A N/A Date Acquired: 1 N/A N/A Weeks of Treatment: Open N/A N/A Wound Status: No N/A N/A Wound Recurrence: 10.3x5.1x1.4 N/A N/A Measurements L x W x D (cm) 41.257 N/A N/A A (cm) : rea 57.76 N/A N/A Volume (cm) : -6.30% N/A N/A % Reduction in A rea: -395.80% N/A N/A % Reduction in Volume:  Category/Stage II N/A N/A Classification: Medium N/A N/A Exudate A mount: Serosanguineous N/A N/A Exudate Type: red, brown N/A N/A Exudate Color: Distinct, outline attached N/A N/A Wound Margin: Large (67-100%) N/A N/A Granulation A mount: Red, Hyper-granulation, Friable N/A N/A Granulation QualityHARLEAN, WHIGHAM ([id number] Page 3 of 6 Small (1-33%) N/A N/A Necrotic Amount: Eschar, Adherent Slough N/A N/A Necrotic Tissue: Fat Layer (Subcutaneous Tissue): Yes N/A N/A Exposed Structures: Fascia: No Tendon: No Muscle: No Joint: No Bone: No Small (1-33%) N/A N/A Epithelialization: No Abnormalities Noted N/A N/A Periwound Skin Texture: No Abnormalities Noted N/A N/A Periwound Skin Moisture: No Abnormalities Noted N/A N/A Periwound Skin Color: No Abnormality N/A N/A Temperature: Treatment Notes Electronic Signature(s) Signed: 10/28/2022 1:33:39 PM By: Fredirick Maudlin MD FACS Entered By: Fredirick Maudlin on 10/28/2022 13:33:39 -------------------------------------------------------------------------------- Multi-Disciplinary Care Plan Details Patient Name: Date of Service: [facility] RD, [name] 10/28/2022 12:45 PM Medical Record Number: [id number] Patient Account Number: [id number] Date of Birth/Sex: Treating  RN: [birth year] (72 y.o. [name] Primary Care Kaiel Weide: [name] MES Other Clinician: Referring Shakisha Abend: Treating Taylen Wendland/Extender: Maudie Flakes, JA MES Weeks in Treatment: 1 Active Inactive Nutrition Nursing Diagnoses: Imbalanced nutrition Potential for alteratiion in Nutrition/Potential for imbalanced nutrition Goals: Patient/caregiver agrees to and verbalizes understanding of need to use nutritional supplements and/or vitamins as prescribed Date Initiated: 10/21/2022 Target Resolution Date: 12/04/2022 Goal Status: Active Interventions: Assess patient nutrition upon admission and as needed per policy Notes: Wound/Skin Impairment Nursing Diagnoses: Impaired tissue integrity Knowledge deficit related to ulceration/compromised skin integrity Goals: Patient/caregiver will verbalize understanding of skin care regimen Date Initiated: 10/21/2022 Target Resolution Date: 12/04/2022 Goal Status: Active Interventions: Assess ulceration(s) every visit Treatment Activities: Skin care regimen initiated : 10/21/2022 Topical wound management initiated : 10/21/2022 Notes: Electronic Signature(s) Signed: 10/28/2022 5:56:13 PM By: Dellie Catholic RN Entered By: Dellie Catholic on 10/28/2022 17:53:47 Adolf, Veatrice Bourbon ([id number] Page 4 of 6 -------------------------------------------------------------------------------- Pain Assessment Details Patient Name: Date of Service: [place], [name] 10/28/2022 12:45 PM Medical Record Number: [id number] Patient Account Number: [id number] Date of Birth/Sex: Treating RN: 16-Feb-1951 (72 y.o. [name] Primary Care Jeremie Giangrande: [name] MES Other Clinician: Referring Jackalyn Haith: Treating Erienne Spelman/Extender: Maudie Flakes, JA MES Weeks in Treatment: 1 Active Problems Location of Pain Severity and Description of Pain Patient Has Paino No Site Locations Pain Management and  Medication Current Pain Management: Electronic Signature(s) Signed: 10/28/2022 5:56:13 PM By: Dellie Catholic RN Entered By: Dellie Catholic on 10/28/2022 13:11:45 -------------------------------------------------------------------------------- Patient/Caregiver Education Details Patient Name: Date of Service: [name], BO Creola Corn 4/3/2024andnbsp12:45 PM Medical Record Number: [id number] Patient Account Number: [id number] Date of Birth/Gender: Treating RN: July 28, 1950 (72 y.o. [name] Primary Care Physician: [name] MES Other Clinician: Referring Physician: Treating Physician/Extender: Maudie Flakes, JA MES Weeks in Treatment: 1 Education Assessment Education Provided To: Patient Education Topics Provided Wound/Skin Impairment: Methods: Explain/Verbal Responses: Return demonstration correctly Electronic Signature(s) Signed: 10/28/2022 5:56:13 PM By: Dellie Catholic RN Entered By: Dellie Catholic on 10/28/2022 17:54:01 Thilges, Veatrice Bourbon ([id number] Page 5 of 6 -------------------------------------------------------------------------------- Wound Assessment Details Patient Name: Date of Service: [place], [name] 10/28/2022 12:45 PM Medical Record Number: [id number] Patient Account Number: [id number] Date of Birth/Sex: Treating RN: 10-17-1950 (72 y.o. [name] Primary Care Hutchinson Isenberg: [name] MES Other Clinician: Referring Antavius Sperbeck: Treating Ellard Nan/Extender: Maudie Flakes, JA MES Weeks in Treatment: 1 Wound Status Wound Number: 6 Primary Pressure Ulcer Etiology: Wound Location: Left,  Plantar Foot Wound Open Wounding Event: Blister Status: Date Acquired: 04/26/2022 Comorbid Anemia, Chronic Obstructive Pulmonary Disease (COPD), Weeks Of Treatment: 1 History: Congestive Heart Failure, Coronary Artery Disease, Hypertension, Clustered Wound: No Hepatitis C, Rheumatoid Arthritis, Paraplegia,  Received Chemotherapy Photos Wound Measurements Length: (cm) 10.3 Width: (cm) 5.1 Depth: (cm) 1.4 Area: (cm) 41.257 Volume: (cm) 57.76 % Reduction in Area: -6.3% % Reduction in Volume: -395.8% Epithelialization: Small (1-33%) Tunneling: No Undermining: No Wound Description Classification: Category/Stage II Wound Margin: Distinct, outline attached Exudate Amount: Medium Exudate Type: Serosanguineous Exudate Color: red, brown Foul Odor After Cleansing: No Slough/Fibrino Yes Wound Bed Granulation Amount: Large (67-100%) Exposed Structure Granulation Quality: Red, Hyper-granulation, Friable Fascia Exposed: No Necrotic Amount: Small (1-33%) Fat Layer (Subcutaneous Tissue) Exposed: Yes Necrotic Quality: Eschar, Adherent Slough Tendon Exposed: No Muscle Exposed: No Joint Exposed: No Bone Exposed: No Periwound Skin Texture Texture Color No Abnormalities Noted: Yes No Abnormalities Noted: Yes Moisture Temperature / Pain No Abnormalities Noted: Yes Temperature: No Abnormality Treatment Notes Wound #6 (Foot) Wound Laterality: Plantar, Left Cleanser Soap and Water Discharge Instruction: May shower and wash wound with dial antibacterial soap and water prior to dressing change. Wound Cleanser Discharge Instruction: Cleanse the wound with wound cleanser prior to applying a clean dressing using gauze sponges, not tissue or cotton balls. KYNNADI, WALLOCH ([id number]) [id number] Page 6 of 6 Peri-Wound Care Topical Primary Dressing Maxorb Extra Ag+ Alginate Dressing, 4x4.75 (in/in) Discharge Instruction: Apply to wound bed as instructed Secondary Dressing ALLEVYN Heel 4 1/2in x 5 1/2in / 10.5cm x 13.5cm Discharge Instruction: Apply over gauze and primary dressing as directed. Woven Gauze Sponge, Non-Sterile 4x4 in Discharge Instruction: Apply over primary dressing as directed. Secured With [employer], 4.5x3.1 (in/yd) Discharge  Instruction: Secure with Kerlix as directed. Surgilast Tubular Stretch Net Dressing, Latex-free, Size 3, Medium-Hand, Arm, Leg, Foot Compression Wrap Compression Stockings Add-Ons Electronic Signature(s) Signed: 10/28/2022 5:56:13 PM By: Dellie Catholic RN Entered By: Dellie Catholic on 10/28/2022 13:12:16 -------------------------------------------------------------------------------- Vitals Details Patient Name: Date of Service: [place], [name] 10/28/2022 12:45 PM Medical Record Number: [id number] Patient Account Number: [id number] Date of Birth/Sex: Treating RN: 03-05-1951 (72 y.o. [name] Primary Care Rece Zechman: [name] MES Other Clinician: Referring Nahal Wanless: Treating Charnae Lill/Extender: Maudie Flakes, JA MES Weeks in Treatment: 1 Vital Signs Time Taken: 12:57 Temperature (F): 97.9 Height (in): 65 Pulse (bpm): 78 Weight (lbs): 167 Respiratory Rate (breaths/min): 18 Body Mass Index (BMI): 27.8 Blood Pressure (mmHg): 105/65 Reference Range: 80 - 120 mg / dl Electronic Signature(s) Signed: 10/28/2022 5:56:13 PM By: Dellie Catholic RN Entered By: Dellie Catholic on 10/28/2022 17:53:35

## 2022-11-04 ENCOUNTER — Encounter (HOSPITAL_BASED_OUTPATIENT_CLINIC_OR_DEPARTMENT_OTHER): Payer: 59 | Admitting: General Surgery

## 2022-11-04 DIAGNOSIS — L89623 Pressure ulcer of left heel, stage 3: Secondary | ICD-10-CM | POA: Diagnosis not present

## 2022-11-04 NOTE — Progress Notes (Signed)
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 wound with  wound cleanser prior to applying a clean dressing using gauze sponges, not tissue or cotton balls. Prim Dressing: Maxorb Extra Ag+ Alginate Dressing, 4x4.75 (in/in) Every Other Day/30 Days ary Discharge Instructions: Apply to wound bed as instructed Secondary Dressing: ALLEVYN Heel 4 1/2in x 5 1/2in / 10.5cm x 13.5cm Every Other Day/30 Days Discharge Instructions: Apply over gauze and primary dressing as directed. Secondary Dressing: Woven Gauze Sponge, Non-Sterile 4x4 in Every Other Day/30 Days Discharge Instructions: Apply over primary dressing as directed. Secured With: [employer], 4.5x3.1 (in/yd) Every Other Day/30 Days Discharge Instructions: Secure with Kerlix as directed. Secured With: Barista Dressing, Latex-free, Size 3, Medium-Hand, Arm, Leg, Foot Every Other Day/30 Days WOUND #7: - Labia Wound Laterality: Medial Cleanser: Soap and Water 1 x Per Day/30 Days Discharge Instructions: May shower and wash wound with dial antibacterial soap and water prior to dressing change. Cleanser: Wound Cleanser 1 x Per Day/30 Days Discharge Instructions: Cleanse the wound with wound  cleanser prior to applying a clean dressing using gauze sponges, not tissue or cotton balls. Peri-Wound Care: Zinc Oxide Ointment 30g tube 1 x Per Day/30 Days Discharge Instructions: Apply Desitin or Zinc Oxide to periwound with each dressing change 11/04/2022: The surface is less friable today but still has some hypertrophic granulation tissue. The deep portion at the center of her calcaneus is about the same. There is dark eschar around the edges and at the back of her calcaneus, under the eschar, there is pressure induced deep tissue injury that upon debridement, reveals an even deeper area of the pressure ulcer. She has some superficial skin breakdown on her labia secondary to having had diarrhea and needing to be cleaned up frequently. I used a curette to debride some slough and white eschar from the most distal aspect of her wound, nearest the toes. I then debrided the dark eschar from around the posterior and lateral edges of her wound. This uncovered the deep tissue injury which I further debrided, removing the nonviable tissue which included subcutaneous material. We will continue silver alginate, packing it into the cavity from the deep tissue injury and into the divot in the center of the plantar calcaneus. I stressed to the patient and emphasized on her facility paperwork the need for 100% offloading of the heel such that it is not subject to any external pressure forces. As for the labial irritation, Desitin or a similar product may be applied. She will follow-up in 2 weeks. Electronic Signature(s) Signed: 11/04/2022 3:59:09 PM By: Duanne Guess MD FACS Previous Signature: 11/04/2022 3:57:03 PM Version By: Duanne Guess MD FACS Entered By: Duanne Guess on 11/04/2022 15:59:09 -------------------------------------------------------------------------------- HxROS Details Patient Name: Date of Service: [name], [name] 11/04/2022 1:30 PM Medical Record Number:  [id number] Patient Account Number: [id number] Date of Birth/Sex: Treating RN: August 11, 1950 (72 y.o. F) Primary Care Provider: [name] MES Other Clinician: Referring Provider: Treating Provider/Extender: Tami Ribas, JA MES Weeks in Treatment: 2 Information Obtained From Patient Hematologic/Lymphatic GAVRIELA, CASHIN ([id number]) [id number] Page 8 of 9 Medical History: Positive for: Anemia Respiratory Medical History: Positive for: Chronic Obstructive Pulmonary Disease (COPD) Cardiovascular Medical History: Positive for: Congestive Heart Failure; Coronary Artery Disease; Hypertension Gastrointestinal Medical History: Positive for: Hepatitis C Past Medical History Notes: gastroparesis Genitourinary Medical History: Past Medical History Notes: Neurogenic Bladder-Indwelling Catheter, CKD Musculoskeletal Medical History: Positive for: Rheumatoid Arthritis Neurologic Medical History: Positive for: Paraplegia Oncologic Medical History: Positive for: Received Chemotherapy Past Medical History Notes: Breast Cancer  Immunizations Pneumococcal Vaccine: Received Pneumococcal Vaccination: No Implantable Devices None Hospitalization / Surgery History Type of Hospitalization/Surgery 07/27/21-08/01/21 r/t 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 Family and Social History Cancer: No; Diabetes: No; Heart Disease: No; Hereditary Spherocytosis: No; Hypertension: Yes - Mother,Siblings,Maternal Grandparents; Kidney Disease: No; Lung Disease: No; Seizures: No; Stroke: No; Thyroid Problems: No; Tuberculosis: No; Former smoker; Marital Status - Single; Alcohol Use: Rarely; Drug Use: No History; Caffeine Use: Daily; Financial Concerns: No; Food, Clothing or Shelter Needs: No; Support System Lacking: No; Transportation Concerns: No Psychologist, prison and probation services) Signed: 11/04/2022 4:32:21 PM By: Duanne Guess MD FACS Entered By: Duanne Guess on 11/04/2022  15:50:44 -------------------------------------------------------------------------------- SuperBill Details Patient Name: Date of Service: [facility] RD, [name] 11/04/2022 Medical Record Number: [id number] Patient Account Number: [id number] Date of Birth/Sex: Treating RN: 1951/06/03 (72 y.o. 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 G90.9 Disorder of the autonomic nervous system, unspecified Facility Procedures : CPT4 Code: [id number] Description: 11042 - DEB SUBQ TISSUE 20 SQ CM/< ICD-10 Diagnosis Description L89.623 Pressure ulcer of left heel, stage 3 Modifier: Quantity: 1 Physician Procedures : CPT4 Code Description Modifier [id number] 99214 - WC PHYS LEVEL 4 - EST PT 25 ICD-10 Diagnosis Description L89.623 Pressure ulcer of left heel, 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

## 2022-11-05 NOTE — Progress Notes (Signed)
[name], [name] ([id number]) [id number] Page 1 of 8 Visit Report for 11/04/2022 Arrival Information Details Patient Name: Date of Service: [name] RD, [name]. 11/04/2022 1:30 PM Medical Record Number: [id number] Patient Account Number: [id number] Date of Birth/Sex: Treating [name]: 1951/03/08 (72 y.o. F) Primary Care [name]: [name] Other Clinician: Referring [name]: Treating [name] Visit Information History Since Last Visit All ordered tests and consults were completed: No Patient Arrived: Wheel Chair Added or deleted any medications: No Arrival Time: 13:52 Any new allergies or adverse reactions: No Accompanied By: self Had a fall or experienced change in No Transfer Assistance: Nurse, adult activities of daily living that may affect Patient Identification Verified: Yes risk of falls: Secondary Verification Process Completed: Yes Signs or symptoms of abuse/neglect since last visito No Hospitalized since last visit: No Implantable device outside of the clinic excluding No cellular tissue based products placed in the center since last visit: Pain Present Now: No Electronic Signature(s) Signed: 11/04/2022 3:48:45 PM By: [name] Entered By: [name] on 11/04/2022 13:53:05 -------------------------------------------------------------------------------- Encounter Discharge Information Details Patient Name: Date of Service: [name] 11/04/2022 1:30 PM Medical Record Number: [id number] Patient Account Number: [id number] Date of Birth/Sex: Treating [name]: 30-Jun-1951 (72 y.o. [name] Primary Care [name]: [name] Other Clinician: Referring Teagon Kron: Treating Dhalia Zingaro/Extender: [name], JA [name] Weeks in Treatment: [name] Encounter Discharge Information Items Post Procedure Vitals Discharge Condition: Stable Temperature (F): 98.6 Ambulatory  Status: Wheelchair Pulse (bpm): 70 Discharge Destination: Other (Note Required) Respiratory Rate (breaths/min): 18 Telephoned: No Blood Pressure (mmHg): 109/75 Orders Sent: Yes Transportation: Private Auto Accompanied By: self Schedule Follow-up Appointment: Yes Clinical Summary of Care: Patient Declined Electronic Signature(s) Signed: 11/04/2022 5:27:18 PM By: [name] Entered By: [name] Schwalbe on 11/04/2022 17:26:48 -------------------------------------------------------------------------------- Lower Extremity Assessment Details Patient Name: Date of Service: [facility] RD, [name]. 11/04/2022 1:30 PM Medical Record Number: [id number] Patient Account Number: [id number] Date of Birth/Sex: Treating [name]: October 09, 1950 (72 y.o. [name] Primary Care Kenzly Rogoff: [name] Other Clinician: Referring [name]: Treating [name]/Extender: [name], JA [name] Weeks in Treatment: [name] Edema Assessment Left: [Left: Right] [Right: :] Assessed: [Left: No] [Right: No] [Left: Edema] [Right: :] Calf Left: Right: Point of Measurement: From Medial Instep 30 cm 30 cm Ankle Left: Right: Point of Measurement: From Medial Instep 21 cm 19.3 cm Vascular Assessment Pulses: Dorsalis Pedis Palpable: [Left:Yes] [Right:Yes] Electronic Signature(s) Signed: 11/04/2022 5:27:18 PM By: [name] Entered By: [name] Schwalbe on 11/04/2022 16:11:18 -------------------------------------------------------------------------------- Multi Wound Chart Details Patient Name: Date of Service: [facility] RD, [name]. 11/04/2022 1:30 PM Medical Record Number: [id number] Patient Account Number: [id number] Date of Birth/Sex: Treating [name]: 10-04-1950 (72 y.o. F) Primary Care [name]: [name] Other Clinician: Referring Smith Mcnicholas: Treating [name]/Extender: [name], JA [name] Weeks in Treatment: [name] Vital Signs Height(in): 65 Pulse(bpm): 70 Weight(lbs): 167 Blood  Pressure(mmHg): 109/75 Body Mass Index(BMI): 27.8 Temperature(F): 98.6 Respiratory Rate(breaths/min): 18 [6:Photos:] [N/A:N/A] Left, Plantar Foot Medial Labia N/A Wound Location: Blister Gradually Appeared N/A Wounding Event: Pressure Ulcer Incontinence Associated Dermatitis N/A Primary Etiology: (IAD) Anemia, Chronic Obstructive Anemia, Chronic Obstructive N/A Comorbid History: Pulmonary Disease (COPD), Pulmonary Disease (COPD), Congestive Heart Failure, Coronary Congestive Heart Failure, Coronary Artery Disease, Hypertension, Artery Disease, Hypertension, Hepatitis C, Rheumatoid Arthritis, Hepatitis C, Rheumatoid Arthritis, Paraplegia, Received Chemotherapy Paraplegia, Received Chemotherapy 04/26/2022 10/30/2022 N/A Date Acquired: [name] 0 N/A Weeks  of Treatment: Open Open N/A Wound Status: No No N/A Wound Recurrence: 10x5x0.8 0.3x0.3x0.1 N/A Measurements L x W x D (cm) 39.27 0.071 N/A A (cm) : rea 31.416 0.007 N/A Volume (cm) : -1.10% N/A N/A % Reduction in Area: -169.70% N/A N/A % Reduction in Volume: Category/Stage II Full Thickness Without Exposed N/A Classification: Support Structures Medium Medium N/A Exudate Amount: Serosanguineous Serosanguineous N/A Exudate TypeAstrid [name]: [name], [name] ([id number]) [id number] Page 3 of 8 red, brown red, brown N/A Exudate Color: Distinct, outline attached N/A N/A Wound Margin: Large (67-100%) Large (67-100%) N/A Granulation Amount: Red, Hyper-granulation, Friable Red N/A Granulation Quality: Small (1-33%) Small (1-33%) N/A Necrotic Amount: Eschar, Adherent Slough Adherent Slough N/A Necrotic Tissue: Fat Layer (Subcutaneous Tissue): Yes Fascia: No N/A Exposed Structures: Fascia: No Fat Layer (Subcutaneous Tissue): No Tendon: No Tendon: No Muscle: No Muscle: No Joint: No Joint: No Bone: No Bone: No Small (1-33%) None N/A Epithelialization: Debridement - Selective/Open Wound N/A  N/A Debridement: Pre-procedure Verification/Time Out 14:32 N/A N/A Taken: Lidocaine 5% topical ointment N/A N/A Pain Control: Necrotic/Eschar, Slough N/A N/A Tissue Debrided: Non-Viable Tissue N/A N/A Level: 0.5 N/A N/A Debridement A (sq cm): rea Curette N/A N/A Instrument: Minimum N/A N/A Bleeding: Pressure N/A N/A Hemostasis A chieved: 0 N/A N/A Procedural Pain: 0 N/A N/A Post Procedural Pain: Procedure was tolerated well N/A N/A Debridement Treatment Response: 10x5x0.8 N/A N/A Post Debridement Measurements L x W x D (cm) 31.416 N/A N/A Post Debridement Volume: (cm) Category/Stage II N/A N/A Post Debridement Stage: No Abnormalities Noted No Abnormalities Noted N/A Periwound Skin Texture: No Abnormalities Noted No Abnormalities Noted N/A Periwound Skin Moisture: No Abnormalities Noted No Abnormalities Noted N/A Periwound Skin Color: No Abnormality No Abnormality N/A Temperature: Debridement N/A N/A Procedures Performed: Treatment Notes Electronic Signature(s) Signed: 11/04/2022 3:40:35 PM By: Duanne Guessannon, Jennifer MD FACS Entered By: Duanne Guessannon, Jennifer on 11/04/2022 15:40:34 -------------------------------------------------------------------------------- Multi-Disciplinary Care Plan Details Patient Name: [facility] [name]. 11/04/2022 1:30 PM Medical Record Number: [id number] Patient Account Number: [id number] Date of Birth/Sex: Treating [name]: 10/10/1950 (72 y.o. Katrinka BlazingF) Scotton, Joanne Primary Care Keishla Oyer: [name] RollingMCGHEE, JA [name] Other Clinician: Referring Chantella Creech: Treating Aarit Kashuba/Extender: [name] Ribasannon, Jennifer MCGHEE, JA [name] Weeks in Treatment: [name] Active Inactive Nutrition Nursing Diagnoses: Imbalanced nutrition Potential for alteratiion in Nutrition/Potential for imbalanced nutrition Goals: Patient/caregiver agrees to and verbalizes understanding of need to use nutritional supplements and/or vitamins as prescribed Date Initiated: 10/21/2022 Target  Resolution Date: 12/04/2022 Goal Status: Active Interventions: Assess patient nutrition upon admission and as needed per policy Notes: Wound/Skin Impairment Nursing Diagnoses: Impaired tissue integrity Raiford NobleCRAWFORD, [name] ([id number]) [id number] Page 4 of 8 Knowledge deficit related to ulceration/compromised skin integrity Goals: Patient/caregiver will verbalize understanding of skin care regimen Date Initiated: 10/21/2022 Target Resolution Date: 12/04/2022 Goal Status: Active Interventions: Assess ulceration(s) every visit Treatment Activities: Skin care regimen initiated : 10/21/2022 Topical wound management initiated : 10/21/2022 Notes: Electronic Signature(s) Signed: 11/04/2022 5:27:18 PM By: [name] SchwalbeScotton, Joanne [name] Entered By: [name] SchwalbeScotton, Joanne on 11/04/2022 17:25:23 -------------------------------------------------------------------------------- Pain Assessment Details Patient Name: Date of Service: [facility], [name]. 11/04/2022 1:30 PM Medical Record Number: [id number] Patient Account Number: [id number] Date of Birth/Sex: Treating [name]: 09/06/1950 (72 y.o. F) Primary Care Amrita Radu: [name] RollingMCGHEE, JA [name] Other Clinician: Referring Twain Stenseth: Treating Julina Altmann/Extender: [name] Ribasannon, Jennifer MCGHEE, JA [name] Weeks in Treatment: [name] Active Problems Location of Pain Severity and Description of Pain Patient Has Paino No Site Locations Pain Management and Medication Current Pain Management: Electronic Signature(s) Signed: 11/04/2022 3:48:45 PM  By: [name] Entered By: [name] on 11/04/2022 13:53:43 -------------------------------------------------------------------------------- Patient/Caregiver Education Details Patient Name: Date of Service: [name] [facility] RD, BO Senaida Lange 4/10/2024andnbsp1:30 PM Medical Record Number: [id number] Patient Account Number: [id number] Date of Birth/Gender: Treating [name]: 07/26/1951 (72 y.o. [name] Primary Care Physician: [name]  Other Clinician: Referring Physician: Treating Physician/Extender: [name], JA [name] Weeks in Treatment: [name] ARSHIKA, TAMS ([id number]) [id number] Page 5 of 8 Education Assessment Education Provided To: Patient Education Topics Provided Wound/Skin Impairment: Methods: Explain/Verbal Responses: Return demonstration correctly Electronic Signature(s) Signed: 11/04/2022 5:27:18 PM By: [name] Entered By: [name] Schwalbe on 11/04/2022 17:25:38 -------------------------------------------------------------------------------- Wound Assessment Details Patient Name: Date of Service: [facility] RD, [name]. 11/04/2022 1:30 PM Medical Record Number: [id number] Patient Account Number: [id number] Date of Birth/Sex: Treating [name]: 1951/03/01 (72 y.o. [name] Primary Care Jasan Doughtie: [name] Other Clinician: Referring Ayvion Kavanagh: Treating Stacey Sago/Extender: [name], JA [name] Weeks in Treatment: [name] Wound Status Wound Number: 6 Primary Pressure Ulcer Etiology: Wound Location: Left, Plantar Foot Wound Open Wounding Event: Blister Status: Date Acquired: 04/26/2022 Comorbid Anemia, Chronic Obstructive Pulmonary Disease (COPD), Weeks Of Treatment: [name] History: Congestive Heart Failure, Coronary Artery Disease, Hypertension, Clustered Wound: No Hepatitis C, Rheumatoid Arthritis, Paraplegia, Received Chemotherapy Photos Wound Measurements Length: (cm) 10 Width: (cm) 5 Depth: (cm) 0.8 Area: (cm) 39.27 Volume: (cm) 31.416 % Reduction in Area: -1.1% % Reduction in Volume: -169.7% Epithelialization: Small (1-33%) Tunneling: No Undermining: No Wound Description Classification: Category/Stage II Wound Margin: Distinct, outline attached Exudate Amount: Medium Exudate Type: Serosanguineous Exudate Color: red, brown Foul Odor After Cleansing: No Slough/Fibrino Yes Wound Bed Granulation Amount: Large (67-100%) Exposed  Structure Granulation Quality: Red, Hyper-granulation, Friable Fascia Exposed: No Necrotic Amount: Small (1-33%) Fat Layer (Subcutaneous Tissue) Exposed: Yes Necrotic Quality: Eschar, Adherent Slough Tendon Exposed: No Muscle Exposed: No Joint Exposed: No LEONDRIA, PAVLOFF [name] ([id number]) [id number] Page 6 of 8 Bone Exposed: No Periwound Skin Texture Texture Color No Abnormalities Noted: Yes No Abnormalities Noted: Yes Moisture Temperature / Pain No Abnormalities Noted: Yes Temperature: No Abnormality Treatment Notes Wound #6 (Foot) Wound Laterality: Plantar, Left Cleanser Soap and Water Discharge Instruction: May shower and wash wound with dial antibacterial soap and water prior to dressing change. Wound Cleanser Discharge Instruction: Cleanse the wound with wound cleanser prior to applying a clean dressing using gauze sponges, not tissue or cotton balls. Peri-Wound Care Topical Primary Dressing Maxorb Extra Ag+ Alginate Dressing, 4x4.75 (in/in) Discharge Instruction: Apply to wound bed as instructed Secondary Dressing ALLEVYN Heel 4 1/2in x 5 1/2in / 10.5cm x 13.5cm Discharge Instruction: Apply over gauze and primary dressing as directed. Woven Gauze Sponge, Non-Sterile 4x4 in Discharge Instruction: Apply over primary dressing as directed. Secured With [employer], 4.5x3.1 (in/yd) Discharge Instruction: Secure with Kerlix as directed. Surgilast Tubular Stretch Net Dressing, Latex-free, Size 3, Medium-Hand, Arm, Leg, Foot Compression Wrap Compression Stockings Add-Ons Electronic Signature(s) Signed: 11/04/2022 5:27:18 PM By: [name] Entered By: [name] Schwalbe on 11/04/2022 14:20:59 -------------------------------------------------------------------------------- Wound Assessment Details Patient Name: Date of Service: [facility] RD, [name]. 11/04/2022 1:30 PM Medical Record Number: [id number] Patient Account Number:  [id number] Date of Birth/Sex: Treating [name]: May 20, 1951 (72 y.o. [name] Primary Care Elodie Panameno: [name] Other Clinician: Referring Arvil Utz: Treating Keland Peyton/Extender: [name], JA [name] Weeks in Treatment: [name] Wound Status Wound Number: 7 Primary Incontinence Associated Dermatitis (IAD) Etiology: Wound Location: Medial Labia Wound Open Wounding Event: Gradually Appeared Status:  Date Acquired: 10/30/2022 Comorbid Anemia, Chronic Obstructive Pulmonary Disease (COPD), Weeks Of Treatment: 0 History: Congestive Heart Failure, Coronary Artery Disease, Hypertension, Clustered Wound: No Hepatitis C, Rheumatoid Arthritis, Paraplegia, Received Chemotherapy Photos DYMONE, HORSFALL ([id number]) [id number] Page 7 of 8 Wound Measurements Length: (cm) 0.3 Width: (cm) 0.3 Depth: (cm) 0.1 Area: (cm) 0.071 Volume: (cm) 0.007 % Reduction in Area: % Reduction in Volume: Epithelialization: None Tunneling: No Undermining: No Wound Description Classification: Full Thickness Without Exposed Support Structures Exudate Amount: Medium Exudate Type: Serosanguineous Exudate Color: red, brown Foul Odor After Cleansing: No Slough/Fibrino Yes Wound Bed Granulation Amount: Large (67-100%) Exposed Structure Granulation Quality: Red Fascia Exposed: No Necrotic Amount: Small (1-33%) Fat Layer (Subcutaneous Tissue) Exposed: No Necrotic Quality: Adherent Slough Tendon Exposed: No Muscle Exposed: No Joint Exposed: No Bone Exposed: No Periwound Skin Texture Texture Color No Abnormalities Noted: Yes No Abnormalities Noted: Yes Moisture Temperature / Pain No Abnormalities Noted: Yes Temperature: No Abnormality Treatment Notes Wound #7 (Labia) Wound Laterality: Medial Cleanser Soap and Water Discharge Instruction: May shower and wash wound with dial antibacterial soap and water prior to dressing change. Wound Cleanser Discharge Instruction:  Cleanse the wound with wound cleanser prior to applying a clean dressing using gauze sponges, not tissue or cotton balls. Peri-Wound Care Zinc Oxide Ointment 30g tube Discharge Instruction: Apply Desitin or Zinc Oxide to periwound with each dressing change Topical Primary Dressing Secondary Dressing Secured With Compression Wrap Compression Stockings Add-Ons Electronic Signature(s) Signed: 11/04/2022 5:27:18 PM By: [name] Entered By: [name] Schwalbe on 11/04/2022 14:23:30 Kohli, Reita Chard ([id number]) [id number] Page 8 of 8 -------------------------------------------------------------------------------- Vitals Details Patient Name: Date of Service: [name] [facility] RD, [name]. 11/04/2022 1:30 PM Medical Record Number: [id number] Patient Account Number: [id number] Date of Birth/Sex: Treating [name]: [birth year] (72 y.o. F) Primary Care Lucas Exline: [name] Other Clinician: Referring Lyliana Dicenso: Treating Jaquise Faux/Extender: [name], JA [name] Weeks in Treatment: [name] Vital Signs Time Taken: 01:53 Temperature (F): 98.6 Height (in): 65 Pulse (bpm): 70 Weight (lbs): 167 Respiratory Rate (breaths/min): 18 Body Mass Index (BMI): 27.8 Blood Pressure (mmHg): 109/75 Reference Range: 80 - 120 mg / dl Electronic Signature(s) Signed: 11/04/2022 3:48:45 PM By: [name] Entered By: [name] on 11/04/2022 13:53:35

## 2022-11-13 ENCOUNTER — Emergency Department (HOSPITAL_COMMUNITY): Payer: 59

## 2022-11-13 ENCOUNTER — Emergency Department (HOSPITAL_COMMUNITY)
Admission: EM | Admit: 2022-11-13 | Discharge: 2022-11-13 | Disposition: A | Discharge status: Skilled Nursing Facility | Payer: 59 | Attending: Emergency Medicine | Admitting: Emergency Medicine

## 2022-11-13 ENCOUNTER — Other Ambulatory Visit: Payer: Self-pay

## 2022-11-13 DIAGNOSIS — R600 Localized edema: Secondary | ICD-10-CM | POA: Diagnosis not present

## 2022-11-13 DIAGNOSIS — N189 Chronic kidney disease, unspecified: Secondary | ICD-10-CM | POA: Insufficient documentation

## 2022-11-13 DIAGNOSIS — Z8505 Personal history of malignant neoplasm of liver: Secondary | ICD-10-CM | POA: Diagnosis not present

## 2022-11-13 DIAGNOSIS — Z7982 Long term (current) use of aspirin: Secondary | ICD-10-CM | POA: Diagnosis not present

## 2022-11-13 DIAGNOSIS — R531 Weakness: Secondary | ICD-10-CM | POA: Insufficient documentation

## 2022-11-13 DIAGNOSIS — R109 Unspecified abdominal pain: Secondary | ICD-10-CM | POA: Insufficient documentation

## 2022-11-13 DIAGNOSIS — Z853 Personal history of malignant neoplasm of breast: Secondary | ICD-10-CM | POA: Diagnosis not present

## 2022-11-13 DIAGNOSIS — R110 Nausea: Secondary | ICD-10-CM | POA: Diagnosis not present

## 2022-11-13 LAB — CBC WITH DIFFERENTIAL/PLATELET
Abs Immature Granulocytes: 0.02 10*3/uL (ref 0.00–0.07)
Basophils Absolute: 0 10*3/uL (ref 0.0–0.1)
Basophils Relative: 1 %
Eosinophils Absolute: 0.2 10*3/uL (ref 0.0–0.5)
Eosinophils Relative: 3 %
HCT: 35 % — ABNORMAL LOW (ref 36.0–46.0)
Hemoglobin: 10.6 g/dL — ABNORMAL LOW (ref 12.0–15.0)
Immature Granulocytes: 0 %
Lymphocytes Relative: 27 %
Lymphs Abs: 1.7 10*3/uL (ref 0.7–4.0)
MCH: 24.8 pg — ABNORMAL LOW (ref 26.0–34.0)
MCHC: 30.3 g/dL (ref 30.0–36.0)
MCV: 82 fL (ref 80.0–100.0)
Monocytes Absolute: 0.4 10*3/uL (ref 0.1–1.0)
Monocytes Relative: 7 %
Neutro Abs: 3.9 10*3/uL (ref 1.7–7.7)
Neutrophils Relative %: 62 %
Platelets: 240 10*3/uL (ref 150–400)
RBC: 4.27 MIL/uL (ref 3.87–5.11)
RDW: 17 % — ABNORMAL HIGH (ref 11.5–15.5)
WBC: 6.2 10*3/uL (ref 4.0–10.5)
nRBC: 0 % (ref 0.0–0.2)

## 2022-11-13 LAB — COMPREHENSIVE METABOLIC PANEL
ALT: 21 U/L (ref 0–44)
AST: 19 U/L (ref 15–41)
Albumin: 3.3 g/dL — ABNORMAL LOW (ref 3.5–5.0)
Alkaline Phosphatase: 62 U/L (ref 38–126)
Anion gap: 10 (ref 5–15)
BUN: 16 mg/dL (ref 8–23)
CO2: 22 mmol/L (ref 22–32)
Calcium: 9.9 mg/dL (ref 8.9–10.3)
Chloride: 104 mmol/L (ref 98–111)
Creatinine, Ser: 0.78 mg/dL (ref 0.44–1.00)
GFR, Estimated: >60 mL/min (ref >60)
Glucose, Bld: 113 mg/dL — ABNORMAL HIGH (ref 70–99)
Potassium: 3.7 mmol/L (ref 3.5–5.1)
Sodium: 136 mmol/L (ref 135–145)
Total Bilirubin: 0.6 mg/dL (ref 0.3–1.2)
Total Protein: 7.9 g/dL (ref 6.5–8.1)

## 2022-11-13 LAB — URINALYSIS, W/ REFLEX TO CULTURE (INFECTION SUSPECTED)
Bilirubin Urine: NEGATIVE
Glucose, UA: NEGATIVE mg/dL
Ketones, ur: NEGATIVE mg/dL
Nitrite: NEGATIVE
Protein, ur: NEGATIVE mg/dL
Specific Gravity, Urine: 1.006 (ref 1.005–1.030)
pH: 6 (ref 5.0–8.0)

## 2022-11-13 LAB — I-STAT CHEM 8, ED
BUN: 18 mg/dL (ref 8–23)
Calcium, Ion: 1.28 mmol/L (ref 1.15–1.40)
Chloride: 105 mmol/L (ref 98–111)
Creatinine, Ser: 0.7 mg/dL (ref 0.44–1.00)
Glucose, Bld: 110 mg/dL — ABNORMAL HIGH (ref 70–99)
HCT: 34 % — ABNORMAL LOW (ref 36.0–46.0)
Hemoglobin: 11.6 g/dL — ABNORMAL LOW (ref 12.0–15.0)
Potassium: 3.9 mmol/L (ref 3.5–5.1)
Sodium: 138 mmol/L (ref 135–145)
TCO2: 24 mmol/L (ref 22–32)

## 2022-11-13 LAB — LIPASE, BLOOD: Lipase: 38 U/L (ref 11–51)

## 2022-11-13 MED ORDER — IOHEXOL 350 MG/ML SOLN
75.0000 mL | Freq: Once | INTRAVENOUS | Status: AC | PRN
  Administered 2022-11-13: 75 mL via INTRAVENOUS

## 2022-11-13 NOTE — Discharge Instructions (Signed)
The CT scan did not show any signs of ileus or obstruction or other acute abnormality.  Continue your current medications.  Consider seeing a GI doctor as needed for recurrent symptoms

## 2022-11-13 NOTE — ED Notes (Signed)
AVS reviewed with pt prior to discharge. Pt verbalizes understanding. Belongings with pt upon depart. Report given to [facility] prior to depart. Pt taken back to [facility] via [facility].

## 2022-11-13 NOTE — ED Notes (Signed)
Patient transported to CT 

## 2022-11-13 NOTE — ED Notes (Signed)
[facility] called; 7th on the list.

## 2022-11-13 NOTE — ED Notes (Signed)
Pt's urine bag emptied at this time.

## 2022-11-13 NOTE — ED Notes (Signed)
Attempted to call report to [facility] at this time. Was left on hold for extended period of time.

## 2022-11-13 NOTE — ED Notes (Signed)
Pt provided with sandwich bag and drink.  

## 2022-11-13 NOTE — ED Triage Notes (Signed)
Pt BIB EMS from [facility]. Per EMS, pt has been having abd pain and nausea intermittently for a few months. Pt's NP sent pt over to have abd CT scan for concerns of small bowel obstruction.. Pt had emesis episode a few days ago. Pt states having minimal bowel movements over the last couple of days with no relief from suppositories. A/Ox4.

## 2022-11-13 NOTE — ED Provider Notes (Signed)
[place] EMERGENCY DEPARTMENT AT [facility] Provider Note   CSN: [id number] Arrival date & time: 11/13/22  1191     History  Chief Complaint  Patient presents with   Abdominal Pain    [name] is a 72 y.o. female.   Abdominal Pain    Patient has a history of breast cancer rheumatoid arthritis, gastroparesis, chronic kidney disease, liver cell carcinoma, paraplegia since 1977 who at baseline is nonambulatory and uses a wheelchair.  Patient states she had had some episodes of nausea and vomiting few days ago but since then has been able to eat and drink.  Patient was sent over from the nursing facility because the patient has been having intermittent abdominal pain and nausea for the past few months.  They were concerned about possible bowel obstruction she was sent to the ED to have a CT scan.  Patient denies any complaints of pain now.  She denies any plaints of nausea.  Patient is calm with medical records that showed she had x-rays last month that showed some dilated loops of bowel  Home Medications Prior to Admission medications   Medication Sig Start Date End Date Taking? Authorizing Provider  acetaminophen (TYLENOL) 325 MG tablet Take 650 mg by mouth every 6 (six) hours as needed.    [provider]  albuterol (VENTOLIN HFA) 108 (90 Base) MCG/ACT inhaler Inhale 2 puffs into the lungs every 4 (four) hours as needed for wheezing or shortness of breath.    [provider]  alum & mag hydroxide-simeth (MAALOX PLUS) 400-400-40 MG/5ML suspension Take 10 mLs by mouth every 6 (six) hours as needed for indigestion, heartburn or flatulence (nausea).    [provider]  Amino Acids-Protein Hydrolys (FEEDING SUPPLEMENT, PRO-STAT 64,) LIQD Take 30 mLs by mouth 2 (two) times daily. For wound healing    [provider]  aspirin EC 81 MG tablet Take 81 mg by mouth daily. Swallow whole.    [provider]  atorvastatin (LIPITOR) 40  MG tablet Take 1 tablet (40 mg total) by mouth daily. 07/28/21 06/25/22  Andrey Campanile, MD  baclofen (LIORESAL) 10 MG tablet Take 1 tablet (10 mg total) by mouth 3 (three) times daily. 09/16/21   Lynn Ito, MD  Benzocaine 10 MG LOZG Use as directed 1 lozenge in the mouth or throat every 2 (two) hours as needed (sore throat).    [provider]  bisacodyl (DULCOLAX) 10 MG suppository Place 1 suppository (10 mg total) rectally every Tuesday, Thursday, Saturday, and Sunday at 6 PM. 02/15/18   Mariea Clonts, Courage, MD  Brimonidine Tartrate (LUMIFY) 0.025 % SOLN Place 1 drop into both eyes daily as needed (redness).    [provider]  Calcium Carb-Cholecalciferol (CALCIUM 500+D3 PO) Take 1 tablet by mouth 2 (two) times daily.    [provider]  calcium-vitamin D (OSCAL WITH D) 500-200 MG-UNIT tablet Take 1 tablet by mouth 2 (two) times daily.    [provider]  Carboxymethylcellulose Sod PF 1 % GEL Place 1 drop into both eyes in the morning, at noon, in the evening, and at bedtime.    [provider]  carvedilol (COREG) 12.5 MG tablet Take 1 tablet (12.5 mg total) by mouth 2 (two) times daily with a meal. Hold for SBP <100 09/23/21   Rollene Rotunda, MD  cetirizine (ZYRTEC) 10 MG tablet Take 10 mg by mouth daily as needed for allergies.     [provider]  cholecalciferol (  VITAMIN D) 1000 units tablet Take 1,000 Units by mouth daily.    [provider]  ciprofloxacin (CIPRO) 500 MG tablet Take 500 mg by mouth daily. 03/22/22   [provider]  Cranberry 450 MG TABS Take 450 mg by mouth daily.    [provider]  Dextromethorphan-guaiFENesin 10-100 MG/5ML liquid Take 15 mLs by mouth every 4 (four) hours as needed. 04/14/21   Evlyn Kanner, MD  diclofenac Sodium (VOLTAREN) 1 % GEL Apply 2 g topically 3 (three) times daily. Apply to bilateral shoulders 07/30/21   [provider]  famotidine (PEPCID) 20 MG tablet Take  20 mg by mouth daily. 06/11/22   [provider]  fenofibrate (TRICOR) 48 MG tablet Take 48 mg by mouth daily. 06/15/22   [provider]  ferrous gluconate (FERGON) 324 MG tablet Take 1 tablet (324 mg total) by mouth daily. 07/28/21   Andrey Campanile, MD  furosemide (LASIX) 40 MG tablet Take 40 mg by mouth 2 (two) times daily. 10/13/21   [provider]  guaifenesin (ROBITUSSIN) 100 MG/5ML syrup Take 200 mg by mouth 4 (four) times daily as needed for cough.    [provider]  hydrocortisone 2.5 % lotion Apply 1 application. topically every 12 (twelve) hours as needed (itching of arms and legs).    [provider]  hydroquinone 2 % cream Apply 1 application. topically 2 (two) times daily. To dark spots    [provider]  hydroxychloroquine (PLAQUENIL) 200 MG tablet Take 200 mg by mouth daily.     [provider]  lactulose (CHRONULAC) 10 GM/15ML solution Take 10 g by mouth daily as needed. 04/07/22   [provider]  lidocaine (XYLOCAINE) 5 % ointment Apply topically. 03/02/22   [provider]  LINZESS 290 MCG CAPS capsule Take 290 mcg by mouth daily. 07/18/21   [provider]  losartan (COZAAR) 25 MG tablet Take 1 tablet (25 mg total) by mouth daily. 09/23/21 06/25/22  Rollene Rotunda, MD  melatonin 3 MG TABS tablet Take 6 mg by mouth at bedtime as needed (sleep).    [provider]  Multiple Vitamin (MULTIVITAMIN WITH MINERALS) TABS tablet Take 1 tablet by mouth daily.    [provider]  omeprazole (PRILOSEC) 40 MG capsule Take 40 mg by mouth daily. 06/11/22   [provider]  ondansetron (ZOFRAN) 4 MG tablet Take 4 mg by mouth every 8 (eight) hours as needed for nausea or vomiting.    [provider]  oxybutynin (DITROPAN-XL) 10 MG 24 hr tablet Take 10 mg by mouth daily. 04/15/22   [provider]  oxybutynin (DITROPAN-XL) 5 MG 24 hr tablet Take 5 mg by mouth  daily. 12/04/20   [provider]  polyethylene glycol (MIRALAX / GLYCOLAX) 17 g packet Take 17 g by mouth daily. Increase to twice a day as needed 12/26/21   Armbruster, Willaim Rayas, MD  Prenatal Vit-DSS-Fe Fum-FA (PRENATAL 19) 29-1 MG TABS Take 1 tablet by mouth daily at 6 (six) AM.    [provider]  Propylene Glycol (SYSTANE COMPLETE OP) Place 2 drops into both eyes in the morning, at noon, in the evening, and at bedtime.    [provider]  Simethicone 125 MG TABS Take 125 mg by mouth every 8 (eight) hours as needed (gas pain).    [provider]  sodium chloride (OCEAN) 0.65 % SOLN nasal spray Place 1 spray into both nostrils as needed for congestion.  [provider]  sucralfate (CARAFATE) 1 GM/10ML suspension Take 1 g by mouth every 6 (six) hours as needed (gerd).    [provider]  tizanidine (ZANAFLEX) 6 MG capsule Take 6 mg by mouth 3 (three) times daily. 03/03/22   [provider]  traMADol (ULTRAM) 50 MG tablet Take 50 mg by mouth every 6 (six) hours as needed (muscle spasms).    [provider]  triamcinolone cream (KENALOG) 0.1 % Apply 1 application. topically 2 (two) times daily. 08/06/21   [provider]  Zinc Oxide (TRIPLE PASTE) 12.8 % ointment Apply 1 application topically as needed for irritation. 04/14/21   Evlyn Kanner, MD  zinc sulfate 220 (50 Zn) MG capsule Take 220 mg by mouth daily.    [provider]      Allergies    Patient has no known allergies.    Review of Systems   Review of Systems  Gastrointestinal:  Positive for abdominal pain.    Physical Exam Updated Vital Signs BP (!) 119/90   Pulse 68   Temp 98 F (36.7 C) (Oral)   Resp 19   Ht 1.651 m ( )   Wt 75.8 kg   SpO2 95%   BMI 27.79 kg/m  Physical Exam Vitals and nursing note reviewed.  Constitutional:      General: She is not in acute distress.    Appearance: She is well-developed.  HENT:     Head:  Normocephalic and atraumatic.     Right Ear: External ear normal.     Left Ear: External ear normal.  Eyes:     General: No scleral icterus.       Right eye: No discharge.        Left eye: No discharge.     Conjunctiva/sclera: Conjunctivae normal.  Neck:     Trachea: No tracheal deviation.  Cardiovascular:     Rate and Rhythm: Normal rate.  Pulmonary:     Effort: Pulmonary effort is normal. No respiratory distress.     Breath sounds: No stridor.  Abdominal:     General: There is no distension.     Palpations: There is no mass.     Tenderness: There is no abdominal tenderness.  Musculoskeletal:        General: No swelling or deformity.     Cervical back: Neck supple.     Right lower leg: Edema present.     Left lower leg: Edema present.  Skin:    General: Skin is warm and dry.     Findings: No rash.  Neurological:     Mental Status: She is alert. Mental status is at baseline.     Cranial Nerves: No dysarthria or facial asymmetry.     Sensory: Sensory deficit present.     Motor: Weakness present. No seizure activity.     Comments: Paraplegia     ED Results / Procedures / Treatments   Labs (all labs ordered are listed, but only abnormal results are displayed) Labs Reviewed  COMPREHENSIVE METABOLIC PANEL - Abnormal; Notable for the following components:      Result Value   Glucose, Bld 113 (*)    Albumin 3.3 (*)    All other components within normal limits  CBC WITH DIFFERENTIAL/PLATELET - Abnormal; Notable for the following components:   Hemoglobin 10.6 (*)    HCT 35.0 (*)    MCH 24.8 (*)    RDW 17.0 (*)    All other components within normal  limits  URINALYSIS, W/ REFLEX TO CULTURE (INFECTION SUSPECTED) - Abnormal; Notable for the following components:   APPearance HAZY (*)    Hgb urine dipstick SMALL (*)    Leukocytes,Ua LARGE (*)    Bacteria, UA FEW (*)    All other components within normal limits  I-STAT CHEM 8, ED - Abnormal; Notable for the following  components:   Glucose, Bld 110 (*)    Hemoglobin 11.6 (*)    HCT 34.0 (*)    All other components within normal limits  URINE CULTURE  LIPASE, BLOOD    EKG None  Radiology CT ABDOMEN PELVIS W CONTRAST  Result Date: 11/13/2022 CLINICAL DATA:  Possible bowel obstruction. Abdominal pain and nausea intermittently over a few months. History of hepatocellular carcinoma post ablation. EXAM: CT ABDOMEN AND PELVIS WITH CONTRAST TECHNIQUE: Multidetector CT imaging of the abdomen and pelvis was performed using the standard protocol following bolus administration of intravenous contrast. RADIATION DOSE REDUCTION: This exam was performed according to the departmental dose-optimization program which includes automated exposure control, adjustment of the mA and/or kV according to patient size and/or use of iterative reconstruction technique. CONTRAST:  75mL OMNIPAQUE IOHEXOL 350 MG/ML SOLN COMPARISON:  10/19/2022, 10/10/2021 FINDINGS: Lower chest: Elevation of the right hemidiaphragm unchanged. Moderate size hiatal hernia unchanged. Minimal chronic scarring/atelectasis right base. Hepatobiliary: Previous cholecystectomy. 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 Pancreas: Normal. 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 Adrenals/Urinary Tract: Adrenal glands are normal. Kidneys are normal in size without hydronephrosis or nephrolithiasis. Kidneys are otherwise unchanged. Ureters are normal. Suprapubic Foley catheter is present within a contracted bladder. Stomach/Bowel: Moderate size hiatal hernia as stomach is otherwise unchanged. Small bowel is within normal. No evidence of bowel obstruction. Appendix is not visualized. Moderate fecal retention over the rectosigmoid colon. Appendix not visualized. Vascular/Lymphatic: Minimal calcified plaque over  the abdominal aorta which is normal in caliber. Remaining vascular structures are unremarkable. No adenopathy. Reproductive: Multiple calcified uterine fibroids with the largest over the right side of the uterine fundus likely serosal in nature. These findings are unchanged. Adnexal regions are unremarkable. Other: No free fluid or focal inflammatory change. Musculoskeletal: Chronic stable changes of the hips likely chronic non healed femoral neck fractures with associated bone resorption. Stable chronic changes of the lumbar spine. IMPRESSION: 1. No acute findings in the abdomen/pelvis. 2. Stable post ablation defect over the posterior segment right lobe of the liver compatible with history of hepatocellular carcinoma. 3. 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 4. Suprapubic Foley catheter within a contracted bladder. 5. Multiple calcified uterine fibroids with the largest over the right side of the uterine fundus likely serosal in nature. 6. Chronic stable changes of the hips. 7. Moderate size hiatal hernia unchanged. Electronically Signed   By: Elberta Fortis M.D.   On: 11/13/2022 14:21    Procedures Procedures    Medications Ordered in ED Medications  iohexol (OMNIPAQUE) 350 MG/ML injection 75 mL (75 mLs Intravenous Contrast Given 11/13/22 1400)    ED Course/ Medical Decision Making/ A&P Clinical Course as of 11/13/22 1501  Fri Nov 13, 2022  1213 CBC with Diff(!) CBC shows hemoglobin decreased compared to previous.  Metabolic panel normal.  Urinalysis does suggest the possibility of UTI. [JK]  1431 CT scan does not show any  acute abnormalities.  Patient has stable findings associated with her prior hepatocellular carcinoma.  There is also a stable cyst in the spleen.  Uterine fibroids noted and hiatal hernia also noted [JK]    Clinical Course User Index [JK] Linwood Dibbles, MD                             Medical Decision  Making Problems Addressed: Abdominal pain, unspecified abdominal location: acute illness or injury  Amount and/or Complexity of Data Reviewed Labs: ordered. Decision-making details documented in ED Course. Radiology: ordered.  Risk Prescription drug management.   Patient presented to the ED for evaluation of abdominal pain.  Patient has been having intermittent symptoms.  She had x-rays last month that showed evidence of an ileus.  Patient was not actually having any nausea vomiting or abdominal pain today but she was sent to the ED for a CT scan because of her recurrent symptoms.  CT scan today does not show any acute abnormality.  Patient does not have evidence of ileus or obstruction.  Her laboratory tests were unremarkable with the exception of her urinalysis however the patient is not having any urinary symptoms and she has an indwelling catheter.  I suspect this is colonization and will not treat.  Evaluation and diagnostic testing in the emergency department does not suggest an emergent condition requiring admission or immediate intervention beyond what has been performed at this time.  The patient is safe for discharge and has been instructed to return immediately for worsening symptoms, change in symptoms or any other concerns.        Final Clinical Impression(s) / ED Diagnoses Final diagnoses:  Abdominal pain, unspecified abdominal location    Rx / DC Orders ED Discharge Orders     None         Linwood Dibbles, MD 11/13/22 1505

## 2022-11-15 LAB — URINE CULTURE

## 2022-11-17 LAB — URINE CULTURE: Culture: >=100000 — AB

## 2022-11-18 ENCOUNTER — Encounter (HOSPITAL_BASED_OUTPATIENT_CLINIC_OR_DEPARTMENT_OTHER): Payer: 59 | Admitting: General Surgery

## 2022-11-18 ENCOUNTER — Telehealth (HOSPITAL_BASED_OUTPATIENT_CLINIC_OR_DEPARTMENT_OTHER): Payer: Self-pay

## 2022-11-18 DIAGNOSIS — L89623 Pressure ulcer of left heel, stage 3: Secondary | ICD-10-CM | POA: Diagnosis not present

## 2022-11-18 NOTE — Telephone Encounter (Signed)
Post ED Visit - Positive Culture Follow-up  Culture report reviewed by antimicrobial stewardship pharmacist: [facility]  Eldridge Scot, Pharm.D.  Celedonio Miyamoto, Pharm.D., BCPS AQ-ID  Garvin Fila, Pharm.D., BCPS  Georgina Pillion, Pharm.D., BCPS  [place], [address].D., BCPS, AAHIVP  Estella Husk, Pharm.D., BCPS, AAHIVP  [name], PharmD, BCPS  Phillips Climes, PharmD, BCPS  Agapito Games, PharmD, BCPS  Verlan Friends, PharmD  Mervyn Gay, PharmD, BCPS  Vinnie Level, PharmD  [facility]  Len Childs, PharmD  Greer Pickerel, PharmD  Adalberto Cole, PharmD  Perlie Gold, Rph  Lonell Face) Jean Rosenthal, PharmD  Earl Many, PharmD  Junita Push, PharmD  Dorna Leitz, PharmD  Terrilee Files, PharmD  Lynann Beaver, PharmD  Keturah Barre, PharmD  Loralee Pacas, PharmD  Bernadene Person, PharmD   Positive urine culture  Reviewed by ED provider Glyn Ade, MD.   Not treated, thought to be colonization, ED provider agrees and recommends pt follow up with Urology to have suprapubic catheter exchanged. Spoke with pt and relayed this information. Pt stated she spoke with the nurse yesterday about a catheter exchange.  no further patient follow-up is required at this time.  Sandria Senter 11/18/2022, 12:42 PM

## 2022-11-18 NOTE — Progress Notes (Signed)
ED Antimicrobial Stewardship Positive Culture Follow Up   [name] is an 72 y.o. female [facility] with a chief complaint of  Chief Complaint  Patient presents with   Abdominal Pain    Recent Results (from the past 720 hour(s))  Urine Culture     Status: Abnormal   Collection Time: 11/13/22 10:15 AM   Specimen: Urine, Catheterized  Result Value Ref Range Status   Specimen Description URINE, CATHETERIZED  Final   Special Requests   Final    NONE Reflexed from [id number] Performed at [facility], [address] [place], [place] [address]    Culture (A)  Final    >=100,000 COLONIES/mL ENTEROBACTER CLOACAE 80,000 COLONIES/mL ENTEROCOCCUS FAECALIS 50,000 COLONIES/mL METHICILLIN RESISTANT STAPHYLOCOCCUS AUREUS    Report Status 11/17/2022 FINAL  Final   Organism ID, Bacteria ENTEROBACTER CLOACAE (A)  Final   Organism ID, Bacteria ENTEROCOCCUS FAECALIS (A)  Final   Organism ID, Bacteria METHICILLIN RESISTANT STAPHYLOCOCCUS AUREUS (A)  Final      Susceptibility   Enterobacter cloacae - MIC*    CEFEPIME >=32 RESISTANT Resistant     CIPROFLOXACIN <=0.25 SENSITIVE Sensitive     GENTAMICIN <=1 SENSITIVE Sensitive     IMIPENEM <=0.25 SENSITIVE Sensitive     NITROFURANTOIN 64 INTERMEDIATE Intermediate     TRIMETH/SULFA <=20 SENSITIVE Sensitive     PIP/TAZO 8 SENSITIVE Sensitive     * >=100,000 COLONIES/mL ENTEROBACTER CLOACAE   Enterococcus faecalis - MIC*    AMPICILLIN <=2 SENSITIVE Sensitive     NITROFURANTOIN <=16 SENSITIVE Sensitive     VANCOMYCIN 1 SENSITIVE Sensitive     * 80,000 COLONIES/mL ENTEROCOCCUS FAECALIS   Methicillin resistant staphylococcus aureus - MIC*    CIPROFLOXACIN >=8 RESISTANT Resistant     GENTAMICIN <=0.5 SENSITIVE Sensitive     NITROFURANTOIN <=16 SENSITIVE Sensitive     OXACILLIN >=4 RESISTANT Resistant     TETRACYCLINE <=1 SENSITIVE Sensitive     VANCOMYCIN <=0.5 SENSITIVE Sensitive     TRIMETH/SULFA <=10  SENSITIVE Sensitive     CLINDAMYCIN >=8 RESISTANT Resistant     RIFAMPIN <=0.5 SENSITIVE Sensitive     Inducible Clindamycin NEGATIVE Sensitive     * 50,000 COLONIES/mL METHICILLIN RESISTANT STAPHYLOCOCCUS AUREUS    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 Recommend follow-up with patient/facility to have catheter exchanged. MD attempted to call with no response.  ED Provider: Glyn Ade, MD   Ellis Savage, PharmD 11/18/2022, 11:16 AM Clinical Pharmacist Monday - Friday phone -  [phone] Saturday - Sunday phone - [phone]

## 2022-11-24 NOTE — Progress Notes (Signed)
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 Sponge, Non-Sterile 4x4 in Every Other Day/30 Days Discharge Instructions: Apply over primary dressing as directed. Secured With: [employer], 4.5x3.1 (in/yd) Every Other Day/30 Days Discharge Instructions: Secure with Kerlix as directed. Secured With: Barista Dressing, Latex-free, Size 3, Medium-Hand, Arm, Leg, Foot Every Other Day/30 Days WOUND #7: - Labia Wound Laterality: Medial Cleanser: Soap and Water 1 x Per Day/30 Days Discharge Instructions: May shower and wash wound with dial antibacterial soap and water prior to dressing change. Cleanser: Wound Cleanser 1 x Per Day/30 Days Discharge Instructions: Cleanse the wound with wound cleanser prior to applying a clean dressing using gauze sponges, not tissue or cotton balls. Peri-Wound Care: Zinc Oxide Ointment 30g tube 1 x Per Day/30 Days Discharge Instructions: Apply Desitin or Zinc Oxide to periwound with each dressing change 11/18/2022: She has had substantial epithelialization and the wound is quite a bit smaller today. There is no longer any depth  at the midportion of her calcaneus. I used a curette to debride slough and dry skin from her wound. We will continue silver alginate; there is no longer any depth of the wound to require packing. Continue heel cup and offloading with Prevalon boots. She will follow-up in 2 [name]. Electronic Signature(s) Signed: 11/18/2022 3:36:26 PM By: [name] Guess MD FACS Entered By: [name] on 11/18/2022 15:36:25 -------------------------------------------------------------------------------- HxROS Details Patient Name: Date of Service: [name], [name] 11/18/2022 2:15 PM Medical Record Number: [id number] Patient Account Number: [id number] Date of Birth/Sex: Treating RN: Aug 03, 1950 (72 y.o. F) Primary Care Provider: Marthenia [name] MES Other Clinician: Referring Provider: Treating Provider/Extender: [name], [name] in Treatment: 4 Information Obtained From Patient Hematologic/Lymphatic Medical History: Positive for: Anemia Respiratory FABIENNE, NOLASCO ([id number]) [id number] Page 8 of 9 Medical History: Positive for: Chronic Obstructive Pulmonary Disease (COPD) Cardiovascular Medical History: Positive for: Congestive Heart Failure; Coronary Artery Disease; Hypertension Gastrointestinal Medical History: Positive for: Hepatitis C Past Medical History Notes: gastroparesis Genitourinary Medical History: Past Medical History Notes: Neurogenic Bladder-Indwelling Catheter, CKD Musculoskeletal Medical History: Positive for: Rheumatoid Arthritis Neurologic Medical History: Positive for: Paraplegia Oncologic Medical History: Positive for: Received Chemotherapy Past Medical History Notes: Breast Cancer Immunizations Pneumococcal Vaccine: Received Pneumococcal Vaccination: No Implantable Devices None Hospitalization / Surgery History Type of Hospitalization/Surgery 07/27/21-08/01/21 r/t UTI 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 Family and Social History Cancer: No; Diabetes: No; Heart Disease: No; Hereditary Spherocytosis: No; Hypertension: Yes - Mother,Siblings,Maternal Grandparents; Kidney Disease: No; Lung Disease: No; Seizures: No; Stroke: No; Thyroid Problems: No; Tuberculosis: No; Former smoker; Marital Status - Single; Alcohol Use: Rarely; Drug Use: No History; Caffeine Use: Daily; Financial Concerns: No; Food, Clothing or Shelter Needs: No; Support System Lacking: No; Transportation Concerns: No Psychologist, prison and probation services) Signed: 11/18/2022 3:54:14 PM By: [name] Guess MD FACS Entered By: [name] on 11/18/2022 15:32:32 -------------------------------------------------------------------------------- SuperBill Details Patient Name: Date of Service: [facility] RD, [name] 11/18/2022 Medical Record Number: [id number] Patient Account Number: [id number] Date of Birth/Sex: Treating RN: [birth year] (72 y.o. 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 G90.9 Disorder of the autonomic nervous system, unspecified Facility Procedures : CPT4 Code: [id number] Description: 7597 - DEBRIDE WOUND 1ST 20 SQ CM OR < ICD-10 Diagnosis Description L89.623 Pressure ulcer of left heel, stage 3 Modifier: Quantity: 1 : CPT4 Code: [id number] 9 Description: 7598 - DEBRIDE WOUND EA ADDL 20 SQ CM ICD-10 Diagnosis Description L89.623 Pressure ulcer of left heel, stage  3 Modifier: Quantity: 1 Physician Procedures : CPT4 Code Description Modifier [id number] 99213 - WC PHYS LEVEL 3 - EST PT 25 ICD-10 Diagnosis Description L89.623 Pressure ulcer of left heel, 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

## 2022-11-24 NOTE — Progress Notes (Signed)
[name], [name] ([id number]) [id number] Page 1 of 8 Visit Report for 11/18/2022 Arrival Information Details Patient Name: Date of Service: [facility] [name], [name] [name]. 11/18/2022 2:15 PM Medical Record Number: [id number] Patient Account Number: [id number] Date of Birth/Sex: Treating RN: [birth year] (72 y.o. [name] Primary Care Zhion Pevehouse: [name] MES Other Clinician: Referring Mystique Bjelland: Treating Marena Witts/Extender: Tami Ribas, JA MES Weeks in Treatment: 4 Visit Information History Since Last Visit All ordered tests and consults were completed: Yes Patient Arrived: Wheel Chair Added or deleted any medications: No Arrival Time: 14:25 Any new allergies or adverse reactions: No Accompanied By: self Had a fall or experienced change in No Transfer Assistance: Nurse, adult activities of daily living that may affect Patient Identification Verified: Yes risk of falls: Secondary Verification Process Completed: Yes Signs or symptoms of abuse/neglect since last visito No Patient Requires Transmission-Based Precautions: No Hospitalized since last visit: No Patient Has Alerts: No Implantable device outside of the clinic excluding No cellular tissue based products placed in the center since last visit: Pain Present Now: No Electronic Signature(s) Signed: 11/23/2022 4:01:24 PM By: Brenton Grills Entered By: Brenton Grills on 11/18/2022 14:28:15 -------------------------------------------------------------------------------- Encounter Discharge Information Details Patient Name: Date of Service: [name], [name] [name]. 11/18/2022 2:15 PM Medical Record Number: [id number] Patient Account Number: [id number] Date of Birth/Sex: Treating RN: 1950/10/09 (72 y.o. [name] Primary Care Kayti Poss: [name] MES Other Clinician: Referring Jennae Hakeem: Treating Justyne Roell/Extender: Tami Ribas, JA MES Weeks in Treatment: 4 Encounter Discharge  Information Items Post Procedure Vitals Discharge Condition: Stable Temperature (F): 98.8 Ambulatory Status: Wheelchair Pulse (bpm): 64 Discharge Destination: Home Respiratory Rate (breaths/min): 18 Transportation: Private Auto Blood Pressure (mmHg): 138/72 Accompanied By: self Schedule Follow-up Appointment: Yes Clinical Summary of Care: Patient Declined Electronic Signature(s) Signed: 11/23/2022 4:01:24 PM By: Brenton Grills Entered By: Brenton Grills on 11/18/2022 16:05:08 -------------------------------------------------------------------------------- Lower Extremity Assessment Details Patient Name: Date of Service: [name], [name] [name]. 11/18/2022 2:15 PM Medical Record Number: [id number] Patient Account Number: [id number] Date of Birth/Sex: Treating RN: Aug 25, 1950 (72 y.o. [name] Primary Care Derold Dorsch: [name] MES Other Clinician: Referring Beena Catano: Treating Donald Jacque/Extender: Tami Ribas, JA MES Weeks in Treatment: 4 Edema Assessment Assessed: [Left: No] [Right: No] C[Left: [name], [name] ([id number])] [Right: [id number] Page 2 of 8] [Left: Edema] [Right: :] Calf Left: Right: Point of Measurement: From Medial Instep 30.4 cm 30.6 cm Ankle Left: Right: Point of Measurement: From Medial Instep 21 cm 19.3 cm Vascular Assessment Pulses: Dorsalis Pedis Palpable: [Left:Yes] [Right:Yes] Electronic Signature(s) Signed: 11/23/2022 4:01:24 PM By: Brenton Grills Entered By: Brenton Grills on 11/18/2022 14:56:16 -------------------------------------------------------------------------------- Multi Wound Chart Details Patient Name: Date of Service: [facility] [name], [name] [name]. 11/18/2022 2:15 PM Medical Record Number: [id number] Patient Account Number: [id number] Date of Birth/Sex: Treating RN: 09-27-1950 (72 y.o.) F) Primary Care Kyannah Climer: [name] MES Other Clinician: Referring Payten [name]: Treating Denasia Venn/Extender: Tami Ribas, JA MES Weeks in Treatment: 4 Vital Signs Height(in): 65 Pulse(bpm): 91 Weight(lbs): 167 Blood Pressure(mmHg): 104/68 Body Mass Index(BMI): 27.8 Temperature(F): 97.7 Respiratory Rate(breaths/min): 20 [6:Photos:] [7:No Photos] [N/A:N/A] Left, Plantar Foot Medial Labia N/A Wound Location: Blister Gradually Appeared N/A Wounding Event: Pressure Ulcer Incontinence Associated Dermatitis N/A Primary Etiology: (IAD) Anemia, Chronic Obstructive Anemia, Chronic Obstructive N/A Comorbid History: Pulmonary Disease (COPD), Pulmonary Disease (COPD), Congestive Heart Failure, Coronary Congestive Heart Failure, Coronary Artery Disease, Hypertension, Artery Disease, Hypertension, Hepatitis C, Rheumatoid Arthritis, Hepatitis C, Rheumatoid Arthritis, Paraplegia, Received Chemotherapy Paraplegia, Received  Chemotherapy 04/26/2022 10/30/2022 N/A Date Acquired: 4 2 N/A Weeks of Treatment: Open Open N/A Wound Status: No No N/A Wound Recurrence: 7x3.8x0.2 0x0x0 N/A Measurements L x W x D (cm) 20.892 0 N/A A (cm) : rea 4.178 0 N/A Volume (cm) : 46.20% 100.00% N/A % Reduction in Area: 64.10% 100.00% N/A % Reduction in Volume: Category/Stage II Full Thickness Without Exposed N/A Classification: Support Structures Medium None Present N/A Exudate Amount: Serosanguineous N/A N/A Exudate Type: red, brown N/A N/A Exudate Color: Distinct, outline attached N/A N/A Wound MarginCHANY, [name] ([id number]) [id number] Page 3 of 8 Large (67-100%) None Present (0%) N/A Granulation Amount: Red, Hyper-granulation, Friable N/A N/A Granulation Quality: Small (1-33%) None Present (0%) N/A Necrotic Amount: Eschar, Adherent Slough N/A N/A Necrotic Tissue: Fat Layer (Subcutaneous Tissue): Yes Fascia: No N/A Exposed Structures: Fascia: No Fat Layer (Subcutaneous Tissue): No Tendon: No Tendon: No Muscle: No Muscle: No Joint: No Joint: No Bone:  No Bone: No Medium (34-66%) None N/A Epithelialization: Debridement - Selective/Open Wound N/A N/A Debridement: Pre-procedure Verification/Time Out 15:25 N/A N/A Taken: Lidocaine 4% Topical Solution N/A N/A Pain Control: Slough N/A N/A Tissue Debrided: Skin/Dermis N/A N/A Level: 20.88 N/A N/A Debridement A (sq cm): rea Curette N/A N/A Instrument: Minimum N/A N/A Bleeding: Pressure N/A N/A Hemostasis A chieved: 0 N/A N/A Procedural Pain: 0 N/A N/A Post Procedural Pain: Procedure was tolerated well N/A N/A Debridement Treatment Response: 7x3.8x0.2 N/A N/A Post Debridement Measurements L x W x D (cm) 4.178 N/A N/A Post Debridement Volume: (cm) Category/Stage II N/A N/A Post Debridement Stage: No Abnormalities Noted No Abnormalities Noted N/A Periwound Skin Texture: No Abnormalities Noted No Abnormalities Noted N/A Periwound Skin Moisture: No Abnormalities Noted No Abnormalities Noted N/A Periwound Skin Color: No Abnormality No Abnormality N/A Temperature: Debridement N/A N/A Procedures Performed: Treatment Notes Electronic Signature(s) Signed: 11/18/2022 3:31:29 PM By: Duanne Guess MD FACS Entered By: Duanne Guess on 11/18/2022 15:31:29 -------------------------------------------------------------------------------- Multi-Disciplinary Care Plan Details Patient Name: Date of Service: [facility] [name], [name] [name]. 11/18/2022 2:15 PM Medical Record Number: [id number] Patient Account Number: [id number] Date of Birth/Sex: Treating RN: 15-Jun-1951 (72 y.o. [name] Primary Care Osamah Schmader: [name] MES Other Clinician: Referring Santanna Olenik: Treating Ruffus Kamaka/Extender: Tami Ribas, JA MES Weeks in Treatment: 4 Active Inactive Nutrition Nursing Diagnoses: Imbalanced nutrition Potential for alteratiion in Nutrition/Potential for imbalanced nutrition Goals: Patient/caregiver agrees to and verbalizes understanding of need to use nutritional  supplements and/or vitamins as prescribed Date Initiated: 10/21/2022 Target Resolution Date: 12/04/2022 Goal Status: Active Interventions: Assess patient nutrition upon admission and as needed per policy Notes: Wound/Skin Impairment Nursing Diagnoses: Impaired tissue integrity Knowledge deficit related to ulceration/compromised skin integrity [name], [name] ([id number]) [id number] Page 4 of 8 Goals: Patient/caregiver will verbalize understanding of skin care regimen Date Initiated: 10/21/2022 Target Resolution Date: 12/04/2022 Goal Status: Active Interventions: Assess ulceration(s) every visit Treatment Activities: Skin care regimen initiated : 10/21/2022 Topical wound management initiated : 10/21/2022 Notes: Electronic Signature(s) Signed: 11/23/2022 4:01:24 PM By: Brenton Grills Entered By: Brenton Grills on 11/18/2022 15:01:40 -------------------------------------------------------------------------------- Pain Assessment Details Patient Name: Date of Service: [facility] [name], [name] [name]. 11/18/2022 2:15 PM Medical Record Number: [id number] Patient Account Number: [id number] Date of Birth/Sex: Treating RN: 02-04-1951 (72 y.o. [name] Primary Care Nashalie Sallis: [name] MES Other Clinician: Referring Tatym Schermer: Treating Zorah Backes/Extender: Tami Ribas, JA MES Weeks in Treatment: 4 Active Problems Location of Pain Severity and Description of Pain Patient Has Paino No Site Locations Pain Management and  Medication Current Pain Management: Electronic Signature(s) Signed: 11/23/2022 4:01:24 PM By: Brenton Grills Entered By: Brenton Grills on 11/18/2022 14:48:44 -------------------------------------------------------------------------------- Patient/Caregiver Education Details Patient Name: Date of Service: [facility] [name], [name] Senaida Lange 4/24/2024andnbsp2:15 PM Medical Record Number: [id number] Patient Account Number: [id number] Date of  Birth/Gender: Treating RN: 11-16-1950 (72 y.o. [name] Primary Care Physician: [name] MES Other Clinician: Referring Physician: Treating Physician/Extender: Tami Ribas, JA MES Weeks in Treatment: 4 Education [address] [name], [name] ([id number]) [id number] Page 5 of 8 Education Provided To: Patient Education Topics Provided Wound/Skin Impairment: Methods: Explain/Verbal Responses: State content correctly Electronic Signature(s) Signed: 11/23/2022 4:01:24 PM By: Brenton Grills Entered By: Brenton Grills on 11/18/2022 15:02:01 -------------------------------------------------------------------------------- Wound Assessment Details Patient Name: Date of Service: [facility] [name], [name] [name]. 11/18/2022 2:15 PM Medical Record Number: [id number] Patient Account Number: [id number] Date of Birth/Sex: Treating RN: 10-09-1950 (72 y.o. [name] Primary Care Tylisa Alcivar: [name] MES Other Clinician: Referring Uriel Horkey: Treating Gabriel Paulding/Extender: Tami Ribas, JA MES Weeks in Treatment: 4 Wound Status Wound Number: 6 Primary Pressure Ulcer Etiology: Wound Location: Left, Plantar Foot Wound Open Wounding Event: Blister Status: Date Acquired: 04/26/2022 Comorbid Anemia, Chronic Obstructive Pulmonary Disease (COPD), Weeks Of Treatment: 4 History: Congestive Heart Failure, Coronary Artery Disease, Hypertension, Clustered Wound: No Hepatitis C, Rheumatoid Arthritis, Paraplegia, Received Chemotherapy Photos Wound Measurements Length: (cm) 7 Width: (cm) 3.8 Depth: (cm) 0.2 Area: (cm) 20.892 Volume: (cm) 4.178 % Reduction in Area: 46.2% % Reduction in Volume: 64.1% Epithelialization: Medium (34-66%) Tunneling: No Undermining: No Wound Description Classification: Category/Stage II Wound Margin: Distinct, outline attached Exudate Amount: Medium Exudate Type: Serosanguineous Exudate Color: red, brown Foul Odor  After Cleansing: No Slough/Fibrino Yes Wound Bed Granulation Amount: Large (67-100%) Exposed Structure Granulation Quality: Red, Hyper-granulation, Friable Fascia Exposed: No Necrotic Amount: Small (1-33%) Fat Layer (Subcutaneous Tissue) Exposed: Yes Necrotic Quality: Eschar, Adherent Slough Tendon Exposed: No Muscle Exposed: No Joint Exposed: No Bone Exposed: No [name], [name] [name] ([id number]) [id number] Page 6 of 8 Periwound Skin Texture Texture Color No Abnormalities Noted: Yes No Abnormalities Noted: Yes Moisture Temperature / Pain No Abnormalities Noted: Yes Temperature: No Abnormality Treatment Notes Wound #6 (Foot) Wound Laterality: Plantar, Left Cleanser Soap and Water Discharge Instruction: May shower and wash wound with dial antibacterial soap and water prior to dressing change. Wound Cleanser Discharge Instruction: Cleanse the wound with wound cleanser prior to applying a clean dressing using gauze sponges, not tissue or cotton balls. Peri-Wound Care Topical Primary Dressing Maxorb Extra Ag+ Alginate Dressing, 4x4.75 (in/in) Discharge Instruction: Apply to wound bed as instructed Secondary Dressing ALLEVYN Heel 4 1/2in x 5 1/2in / 10.5cm x 13.5cm Discharge Instruction: Apply over gauze and primary dressing as directed. Woven Gauze Sponge, Non-Sterile 4x4 in Discharge Instruction: Apply over primary dressing as directed. Secured With [employer], 4.5x3.1 (in/yd) Discharge Instruction: Secure with Kerlix as directed. Surgilast Tubular Stretch Net Dressing, Latex-free, Size 3, Medium-Hand, Arm, Leg, Foot Compression Wrap Compression Stockings Add-Ons Electronic Signature(s) Signed: 11/23/2022 4:01:24 PM By: Brenton Grills Entered By: Brenton Grills on 11/18/2022 15:05:36 -------------------------------------------------------------------------------- Wound Assessment Details Patient Name: Date of Service: [name] [name], [name]  [name]. 11/18/2022 2:15 PM Medical Record Number: [id number] Patient Account Number: [id number] Date of Birth/Sex: Treating RN: 05-20-1951 (72 y.o. [name] Primary Care Vertie Dibbern: [name] MES Other Clinician: Referring Jayen Bromwell: Treating Kaitlynne Wenz/Extender: Tami Ribas, JA MES Weeks in Treatment: 4 Wound Status Wound Number: 7 Primary Incontinence Associated Dermatitis (IAD) Etiology: Wound Location: Medial  Labia Wound Open Wounding Event: Gradually Appeared Status: Date Acquired: 10/30/2022 Comorbid Anemia, Chronic Obstructive Pulmonary Disease (COPD), Weeks Of Treatment: 2 History: Congestive Heart Failure, Coronary Artery Disease, Hypertension, Clustered Wound: No Hepatitis C, Rheumatoid Arthritis, Paraplegia, Received Chemotherapy Wound Measurements Length: (cm) Width: (cm) Depth: (cm) Area: (cm) Volume: (cm) Whitcomb, [name] ([id number]) Wound Description Classification: Full Thickness Without Exposed Support Exudate Amount: None Present Foul Odor After Cleansing: Slough/Fibrino 0 % Reduction in Area: 100% 0 % Reduction in Volume: 100% 0 Epithelialization: None 0 0 [id number] Page 7 of 8 Structures No No Wound Bed Granulation Amount: None Present (0%) Exposed Structure Necrotic Amount: None Present (0%) Fascia Exposed: No Fat Layer (Subcutaneous Tissue) Exposed: No Tendon Exposed: No Muscle Exposed: No Joint Exposed: No Bone Exposed: No Periwound Skin Texture Texture Color No Abnormalities Noted: Yes No Abnormalities Noted: Yes Moisture Temperature / Pain No Abnormalities Noted: Yes Temperature: No Abnormality Treatment Notes Wound #7 (Labia) Wound Laterality: Medial Cleanser Soap and Water Discharge Instruction: May shower and wash wound with dial antibacterial soap and water prior to dressing change. Wound Cleanser Discharge Instruction: Cleanse the wound with wound cleanser prior to applying a clean  dressing using gauze sponges, not tissue or cotton balls. Peri-Wound Care Zinc Oxide Ointment 30g tube Discharge Instruction: Apply Desitin or Zinc Oxide to periwound with each dressing change Topical Primary Dressing Secondary Dressing Secured With Compression Wrap Compression Stockings Add-Ons Electronic Signature(s) Signed: 11/23/2022 4:01:24 PM By: Brenton Grills Entered By: Brenton Grills on 11/18/2022 15:00:53 -------------------------------------------------------------------------------- Vitals Details Patient Name: Date of Service: [name], [name] [name]. 11/18/2022 2:15 PM Medical Record Number: [id number] Patient Account Number: [id number] Date of Birth/Sex: Treating RN: Nov 20, 1950 (72 y.o. [name] Primary Care Keyonda Bickle: [name] MES Other Clinician: Referring Penny Frisbie: Treating Ezequias Lard/Extender: Tami Ribas, JA MES Weeks in Treatment: 4 Vital Signs Time Taken: 14:30 Temperature (F): 97.7 Height (in): 65 Pulse (bpm): 91 Weight (lbs): 167 Respiratory Rate (breaths/min): 20 Body Mass Index (BMI): 27.8 Blood Pressure (mmHg): 104/68 Reference Range: 80 - 120 mg / dl Electronic Signature(s) Signed: 11/23/2022 4:01:24 PM By: Marcha Solders ([id number]) By: Brenton Grills [phone].pdf Page 8 of 8 Signed: 11/23/2022 4:01:24 PM Entered By: Brenton Grills on 11/18/2022 14:48:36

## 2022-12-02 ENCOUNTER — Encounter (HOSPITAL_BASED_OUTPATIENT_CLINIC_OR_DEPARTMENT_OTHER): Payer: 59 | Attending: General Surgery | Admitting: General Surgery

## 2022-12-02 DIAGNOSIS — G909 Disorder of the autonomic nervous system, unspecified: Secondary | ICD-10-CM | POA: Diagnosis not present

## 2022-12-02 DIAGNOSIS — B182 Chronic viral hepatitis C: Secondary | ICD-10-CM | POA: Diagnosis not present

## 2022-12-02 DIAGNOSIS — I5042 Chronic combined systolic (congestive) and diastolic (congestive) heart failure: Secondary | ICD-10-CM | POA: Insufficient documentation

## 2022-12-02 DIAGNOSIS — J449 Chronic obstructive pulmonary disease, unspecified: Secondary | ICD-10-CM | POA: Diagnosis not present

## 2022-12-02 DIAGNOSIS — K592 Neurogenic bowel, not elsewhere classified: Secondary | ICD-10-CM | POA: Diagnosis not present

## 2022-12-02 DIAGNOSIS — Z853 Personal history of malignant neoplasm of breast: Secondary | ICD-10-CM | POA: Diagnosis not present

## 2022-12-02 DIAGNOSIS — I130 Hypertensive heart and chronic kidney disease with heart failure and stage 1 through stage 4 chronic kidney disease, or unspecified chronic kidney disease: Secondary | ICD-10-CM | POA: Insufficient documentation

## 2022-12-02 DIAGNOSIS — Z9359 Other cystostomy status: Secondary | ICD-10-CM | POA: Diagnosis not present

## 2022-12-02 DIAGNOSIS — L89623 Pressure ulcer of left heel, stage 3: Secondary | ICD-10-CM | POA: Diagnosis present

## 2022-12-02 DIAGNOSIS — Z87891 Personal history of nicotine dependence: Secondary | ICD-10-CM | POA: Insufficient documentation

## 2022-12-02 DIAGNOSIS — Z993 Dependence on wheelchair: Secondary | ICD-10-CM | POA: Diagnosis not present

## 2022-12-02 DIAGNOSIS — I2510 Atherosclerotic heart disease of native coronary artery without angina pectoris: Secondary | ICD-10-CM | POA: Diagnosis not present

## 2022-12-02 DIAGNOSIS — G8220 Paraplegia, unspecified: Secondary | ICD-10-CM | POA: Diagnosis not present

## 2022-12-02 DIAGNOSIS — M069 Rheumatoid arthritis, unspecified: Secondary | ICD-10-CM | POA: Diagnosis not present

## 2022-12-14 ENCOUNTER — Telehealth: Payer: Self-pay | Admitting: Neurology

## 2022-12-14 ENCOUNTER — Ambulatory Visit (INDEPENDENT_AMBULATORY_CARE_PROVIDER_SITE_OTHER): Payer: 59 | Admitting: Neurology

## 2022-12-14 ENCOUNTER — Encounter: Payer: Self-pay | Admitting: Neurology

## 2022-12-14 VITALS — BP 116/74 | HR 68 | Resp 15

## 2022-12-14 DIAGNOSIS — R5383 Other fatigue: Secondary | ICD-10-CM | POA: Diagnosis not present

## 2022-12-14 DIAGNOSIS — R413 Other amnesia: Secondary | ICD-10-CM | POA: Insufficient documentation
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
## 2022-12-14 NOTE — Telephone Encounter (Signed)
[employer] medicare/[place] medicaid NPR sent to [employer] [phone]

## 2022-12-15 ENCOUNTER — Telehealth: Payer: Self-pay | Admitting: Neurology

## 2022-12-15 DIAGNOSIS — G309 Alzheimer's disease, unspecified: Secondary | ICD-10-CM

## 2022-12-15 LAB — RPR: RPR Ser Ql: NONREACTIVE

## 2022-12-15 LAB — TSH: TSH: 2.6 u[IU]/mL (ref 0.450–4.500)

## 2022-12-15 LAB — VITAMIN B12: Vitamin B-12: 1439 pg/mL — ABNORMAL HIGH (ref 232–1245)

## 2022-12-15 NOTE — Telephone Encounter (Signed)
Called and spoke to zenett and she states that the NP, [employer] suggests that the patient be sedated for the MRI due to severe claustrophobia, I advised typically that we give xanax or valium before sedation, and she states the MRI scheduler mentioned that and the NP thinks it will not be effective on her. Please advise as work in Librarian, academic for Dr. Terrace Arabia

## 2022-12-15 NOTE — Telephone Encounter (Signed)
[email or web address]  reports that the order for the MRI has to be resubmitted as :order must be with Sedation.  Zenett was informed of this by [facility] in MRI scheduling at [facility]. Phone rep was advised by MRI coordinator to forward this to Dr. Terrace Arabia and her POD

## 2022-12-16 ENCOUNTER — Telehealth: Payer: Self-pay

## 2022-12-16 ENCOUNTER — Encounter (HOSPITAL_BASED_OUTPATIENT_CLINIC_OR_DEPARTMENT_OTHER): Payer: 59 | Admitting: General Surgery

## 2022-12-16 DIAGNOSIS — L89623 Pressure ulcer of left heel, stage 3: Secondary | ICD-10-CM | POA: Diagnosis not present

## 2022-12-16 NOTE — Telephone Encounter (Signed)
1ST ATTEMPT  Left msg to call back to obtain results

## 2022-12-16 NOTE — Telephone Encounter (Signed)
-----   Message from Saima Athar, MD sent at 12/16/2022 12:26 PM EDT ----- Labs are fine, but B12 over normal limit. If she is taking a B12 supplement, she can stop it for now and have her level rechecked by PCP in about 3 months. 

## 2022-12-16 NOTE — Telephone Encounter (Signed)
Call to give lab results to RN at facility. Left message for RN to return call. 2nd attempt

## 2022-12-16 NOTE — Telephone Encounter (Signed)
-----   Message from Huston Foley, MD sent at 12/16/2022 12:26 PM EDT ----- Labs are fine, but B12 over normal limit. If she is taking a B12 supplement, she can stop it for now and have her level rechecked by PCP in about 3 months.

## 2022-12-17 ENCOUNTER — Telehealth: Payer: Self-pay

## 2022-12-17 NOTE — Addendum Note (Signed)
Addended by: Judi Cong on: 12/17/2022 01:52 PM   Modules accepted: Orders

## 2022-12-17 NOTE — Telephone Encounter (Signed)
Mri sedation request placed on Dr. Cheri Rous, desk since he is the work in provider for dr. Terrace Arabia.

## 2022-12-17 NOTE — Telephone Encounter (Signed)
I have re-entered the order as VO from Dr Vickey Huger to be completed in the hospital under sedation.

## 2022-12-17 NOTE — Telephone Encounter (Signed)
The hospital sent me H&P paperwork to be filled out for the patient's appointment on 7/2, given to the pod.

## 2022-12-17 NOTE — Telephone Encounter (Signed)
New order sent to [facility]. [phone]

## 2022-12-23 ENCOUNTER — Ambulatory Visit (HOSPITAL_COMMUNITY): Payer: 59

## 2022-12-23 NOTE — Telephone Encounter (Signed)
Form has been signed and faxed to [facility].

## 2022-12-30 ENCOUNTER — Encounter (HOSPITAL_BASED_OUTPATIENT_CLINIC_OR_DEPARTMENT_OTHER): Payer: 59 | Attending: General Surgery | Admitting: General Surgery

## 2022-12-30 DIAGNOSIS — Z9359 Other cystostomy status: Secondary | ICD-10-CM | POA: Insufficient documentation

## 2022-12-30 DIAGNOSIS — K592 Neurogenic bowel, not elsewhere classified: Secondary | ICD-10-CM | POA: Insufficient documentation

## 2022-12-30 DIAGNOSIS — Z993 Dependence on wheelchair: Secondary | ICD-10-CM | POA: Diagnosis not present

## 2022-12-30 DIAGNOSIS — L89623 Pressure ulcer of left heel, stage 3: Secondary | ICD-10-CM | POA: Insufficient documentation

## 2022-12-30 DIAGNOSIS — G909 Disorder of the autonomic nervous system, unspecified: Secondary | ICD-10-CM | POA: Insufficient documentation

## 2022-12-30 DIAGNOSIS — I5042 Chronic combined systolic (congestive) and diastolic (congestive) heart failure: Secondary | ICD-10-CM | POA: Diagnosis not present

## 2022-12-30 DIAGNOSIS — G8220 Paraplegia, unspecified: Secondary | ICD-10-CM | POA: Insufficient documentation

## 2023-01-05 NOTE — Progress Notes (Signed)
[name], REALMUTO ([id number]) [id number] Page 1 of 7 Visit Report for 12/02/2022 Arrival Information Details Patient Name: Date of Service: CRA [facility] RD, [name] 12/02/2022 12:30 PM Medical Record Number: [id number] Patient Account Number: [id number] Date of Birth/Sex: Treating RN: 12-15-1950 (72 y.o. [name] Primary Care Merrick Feutz: [name] MES Other Clinician: Referring Karey Stucki: Treating Kimi Bordeau/Extender: Tami Ribas, JA MES Weeks in Treatment: 6 Visit Information History Since Last Visit All ordered tests and consults were completed: Yes Patient Arrived: Wheel Chair Added or deleted any medications: No Arrival Time: 12:38 Any new allergies or adverse reactions: No Accompanied By: self Had a fall or experienced change in No Transfer Assistance: Nurse, adult activities of daily living that may affect Patient Identification Verified: Yes risk of falls: Secondary Verification Process Completed: Yes Signs or symptoms of abuse/neglect since last visito No Patient Requires Transmission-Based Precautions: No Hospitalized since last visit: No Patient Has Alerts: No Implantable device outside of the clinic excluding No cellular tissue based products placed in the center since last visit: Pain Present Now: No Electronic Signature(s) Signed: 01/05/2023 7:49:05 AM By: Brenton Grills Entered By: Brenton Grills on 12/02/2022 12:42:14 -------------------------------------------------------------------------------- Encounter Discharge Information Details Patient Name: Date of Service: [facility] RD, [name] 12/02/2022 12:30 PM Medical Record Number: [id number] Patient Account Number: [id number] Date of Birth/Sex: Treating RN: 05/04/1951 (72 y.o. [name] Primary Care Ninette Cotta: [name] MES Other Clinician: Referring Yerick Eggebrecht: Treating Jag Lenz/Extender: Tami Ribas, JA MES Weeks in Treatment: 6 Encounter Discharge  Information Items Post Procedure Vitals Discharge Condition: Stable Temperature (F): 97.7 Ambulatory Status: Wheelchair Pulse (bpm): 88 Discharge Destination: Skilled Nursing Facility Respiratory Rate (breaths/min): 20 Telephoned: No Blood Pressure (mmHg): 138/72 Orders Sent: Yes Transportation: Private Auto Accompanied By: self Schedule Follow-up Appointment: Yes Clinical Summary of Care: Patient Declined Electronic Signature(s) Signed: 01/05/2023 7:49:05 AM By: Brenton Grills Entered By: Brenton Grills on 12/02/2022 13:43:32 -------------------------------------------------------------------------------- Lower Extremity Assessment Details Patient Name: Date of Service: [facility] RD, [name] 12/02/2022 12:30 PM Medical Record Number: [id number] Patient Account Number: [id number] Date of Birth/Sex: Treating RN: 01/18/1951 (72 y.o. [name] Primary Care Avaleigh Decuir: [name] MES Other Clinician: Referring Jennavie Martinek: Treating Johonna Binette/Extender: Tami Ribas, JA MES Weeks in Treatment: 6 Edema Assessment Left: [Left: Right] [Right: :] Assessed: [Left: No] [Right: No] [Left: Edema] [Right: :] Calf Left: Right: Point of Measurement: From Medial Instep 30.4 cm 30.6 cm Ankle Left: Right: Point of Measurement: From Medial Instep 21 cm 19.3 cm Vascular Assessment Pulses: Dorsalis Pedis Palpable: [Left:Yes] [Right:Yes] Electronic Signature(s) Signed: 01/05/2023 7:49:05 AM By: Brenton Grills Entered By: Brenton Grills on 12/02/2022 12:59:54 -------------------------------------------------------------------------------- Multi Wound Chart Details Patient Name: Date of Service: [facility] RD, [name] 12/02/2022 12:30 PM Medical Record Number: [id number] Patient Account Number: [id number] Date of Birth/Sex: Treating RN: 1950-07-28 (72 y.o. F) Primary Care Laelle Bridgett: [name] MES Other Clinician: Referring Margaretmary Prisk: Treating Ciel Yanes/Extender: Tami Ribas,  JA MES Weeks in Treatment: 6 Vital Signs Height(in): 65 Pulse(bpm): 78 Weight(lbs): 167 Blood Pressure(mmHg): 98/64 Body Mass Index(BMI): 27.8 Temperature(F): 98 Respiratory Rate(breaths/min): 20 [6:Photos: No Photos] [N/A:N/A] Left, Plantar Foot Left, Plantar Foot N/A Wound Location: Blister Blister N/A Wounding Event: Pressure Ulcer Pressure Ulcer N/A Primary Etiology: Anemia, Chronic Obstructive Anemia, Chronic Obstructive N/A Comorbid History: Pulmonary Disease (COPD), Pulmonary Disease (COPD), Congestive Heart Failure, Coronary Congestive Heart Failure, Coronary Artery Disease, Hypertension, Artery Disease, Hypertension, Hepatitis C, Rheumatoid Arthritis, Hepatitis C, Rheumatoid Arthritis, Paraplegia, Received Chemotherapy Paraplegia, Received Chemotherapy  04/26/2022 04/26/2022 N/A Date Acquired: 6 6 N/A Weeks of Treatment: Open Open N/A Wound Status: No No N/A Wound Recurrence: 5x3x0.2 5x3x0.2 N/A Measurements L x W x D (cm) 11.781 11.781 N/A A (cm) : rea 2.356 2.356 N/A Volume (cm) : 69.70% 69.70% N/A % Reduction in A rea: 79.80% 79.80% N/A % Reduction in Volume: Category/Stage II Category/Stage II N/A Classification: Medium Medium N/A Exudate A mount: Serosanguineous Serosanguineous N/A Exudate Type: red, brown red, brown N/A Exudate Color: Distinct, outline attached Distinct, outline attached N/A Wound MarginSHAQUAN, FONTANEZ B ([id number]) [id number] Page 3 of 7 Large (67-100%) Large (67-100%) N/A Granulation Amount: Red, Hyper-granulation, Friable Red, Hyper-granulation, Friable N/A Granulation Quality: Small (1-33%) Small (1-33%) N/A Necrotic Amount: Eschar Eschar, Adherent Slough N/A Necrotic Tissue: Fat Layer (Subcutaneous Tissue): Yes Fat Layer (Subcutaneous Tissue): Yes N/A Exposed Structures: Fascia: No Fascia: No Tendon: No Tendon: No Muscle: No Muscle: No Joint: No Joint: No Bone: No Bone: No Medium  (34-66%) Medium (34-66%) N/A Epithelialization: Debridement - Selective/Open Wound Debridement - Selective/Open Wound N/A Debridement: Pre-procedure Verification/Time Out 13:25 13:25 N/A Taken: Lidocaine 4% Topical Solution Lidocaine 4% Topical Solution N/A Pain Control: [employer] N/A Tissue Debrided: Skin/Epidermis Skin/Epidermis N/A Level: 8.83 8.83 N/A Debridement A (sq cm): rea Curette, Forceps, Scissors Curette, Forceps, Scissors N/A Instrument: Minimum Minimum N/A Bleeding: Pressure Pressure N/A Hemostasis A chieved: 0 0 N/A Procedural Pain: 0 0 N/A Post Procedural Pain: Procedure was tolerated well Procedure was tolerated well N/A Debridement Treatment Response: 5x3x0.2 5x3x0.2 N/A Post Debridement Measurements L x W x D (cm) 2.356 2.356 N/A Post Debridement Volume: (cm) Category/Stage II Category/Stage II N/A Post Debridement Stage: No Abnormalities Noted No Abnormalities Noted N/A Periwound Skin Texture: No Abnormalities Noted No Abnormalities Noted N/A Periwound Skin Moisture: No Abnormalities Noted No Abnormalities Noted N/A Periwound Skin Color: No Abnormality No Abnormality N/A Temperature: Debridement Debridement N/A Procedures Performed: Treatment Notes Wound #6 (Foot) Wound Laterality: Plantar, Left Cleanser Soap and Water Discharge Instruction: May shower and wash wound with dial antibacterial soap and water prior to dressing change. Wound Cleanser Discharge Instruction: Cleanse the wound with wound cleanser prior to applying a clean dressing using gauze sponges, not tissue or cotton balls. Peri-Wound Care Topical Primary Dressing Maxorb Extra Ag+ Alginate Dressing, 4x4.75 (in/in) Discharge Instruction: Apply to wound bed as instructed Secondary Dressing ALLEVYN Heel 4 1/2in x 5 1/2in / 10.5cm x 13.5cm Discharge Instruction: Apply over gauze and primary dressing as directed. Woven Gauze Sponge, Non-Sterile 4x4 in Discharge Instruction:  Apply over primary dressing as directed. Secured With [employer], 4.5x3.1 (in/yd) Discharge Instruction: Secure with Kerlix as directed. Surgilast Tubular Stretch Net Dressing, Latex-free, Size 3, Medium-Hand, Arm, Leg, Foot Compression Wrap Compression Stockings Add-Ons Wound #7 (Labia) Wound Laterality: Medial Cleanser Peri-Wound Care Topical Primary Dressing Secondary Dressing SHADI, WHITCOMB ([id number]) [id number] Page 4 of 7 Secured With Compression Wrap Compression Stockings Facilities manager) Signed: 12/02/2022 1:47:49 PM By: Duanne Guess MD FACS Entered By: Duanne Guess on 12/02/2022 13:47:49 -------------------------------------------------------------------------------- Multi-Disciplinary Care Plan Details Patient Name: Date of Service: [facility] RD, [name] 12/02/2022 12:30 PM Medical Record Number: [id number] Patient Account Number: [id number] Date of Birth/Sex: Treating RN: 30-Oct-1950 (72 y.o. [name] Primary Care Elnora Quizon: [name] MES Other Clinician: Referring Jeronimo Hellberg: Treating Angelys Yetman/Extender: Tami Ribas, JA MES Weeks in Treatment: 6 Active Inactive Nutrition Nursing Diagnoses: Imbalanced nutrition Potential for alteratiion in Nutrition/Potential for imbalanced nutrition Goals: Patient/caregiver agrees to and verbalizes understanding of need to  use nutritional supplements and/or vitamins as prescribed Date Initiated: 10/21/2022 Target Resolution Date: 12/04/2022 Goal Status: Active Interventions: Assess patient nutrition upon admission and as needed per policy Notes: Wound/Skin Impairment Nursing Diagnoses: Impaired tissue integrity Knowledge deficit related to ulceration/compromised skin integrity Goals: Patient/caregiver will verbalize understanding of skin care regimen Date Initiated: 10/21/2022 Target Resolution Date: 12/04/2022 Goal Status:  Active Interventions: Assess ulceration(s) every visit Treatment Activities: Skin care regimen initiated : 10/21/2022 Topical wound management initiated : 10/21/2022 Notes: Electronic Signature(s) Signed: 01/05/2023 7:49:05 AM By: Brenton Grills Entered By: Brenton Grills on 12/02/2022 13:06:47 -------------------------------------------------------------------------------- Pain Assessment Details Patient Name: Date of Service: [facility] RD, [name] 12/02/2022 12:30 PM Medical Record Number: [id number] Patient Account Number: [id number] MADOLYN, ACKROYD ([id number]) [phone].pdf Page 5 of 7 Date of Birth/Sex: Treating RN: Jun 02, 1951 (72 y.o. [name] Primary Care Mariha Sleeper: [name] MES Other Clinician: Referring Endre Coutts: Treating Kensley Lares/Extender: Tami Ribas, JA MES Weeks in Treatment: 6 Active Problems Location of Pain Severity and Description of Pain Patient Has Paino No Site Locations Pain Management and Medication Current Pain Management: Electronic Signature(s) Signed: 01/05/2023 7:49:05 AM By: Brenton Grills Entered By: Brenton Grills on 12/02/2022 12:44:51 -------------------------------------------------------------------------------- Patient/Caregiver Education Details Patient Name: Date of Service: [name], [name] 5/8/2024andnbsp12:30 PM Medical Record Number: [id number] Patient Account Number: [id number] Date of Birth/Gender: Treating RN: [birth year] (72 y.o. [name] Primary Care Physician: [name] MES Other Clinician: Referring Physician: Treating Physician/Extender: Tami Ribas, JA MES Weeks in Treatment: 6 Education Assessment Education Provided To: Patient Education Topics Provided Wound/Skin Impairment: Methods: Explain/Verbal Responses: State content correctly Electronic Signature(s) Signed: 01/05/2023 7:49:05 AM By: Brenton Grills Entered By: Brenton Grills on 12/02/2022  13:07:10 -------------------------------------------------------------------------------- Wound Assessment Details Patient Name: Date of Service: [facility] RD, [name] 12/02/2022 12:30 PM Medical Record Number: [id number] Patient Account Number: [id number] Date of Birth/Sex: Treating RN: 27-Oct-1950 (72 y.o. [name] Primary Care Juell Radney: [name] MES Other Clinician: RAYLYNN, HERSH ([id number]) [id number] Page 6 of 7 Referring Lucill Mauck: Treating Juvenal Umar/Extender: Tami Ribas, JA MES Weeks in Treatment: 6 Wound Status Wound Number: 6 Primary Pressure Ulcer Etiology: Wound Location: Left, Plantar Foot Wound Open Wounding Event: Blister Status: Date Acquired: 04/26/2022 Comorbid Anemia, Chronic Obstructive Pulmonary Disease (COPD), Weeks Of Treatment: 6 History: Congestive Heart Failure, Coronary Artery Disease, Hypertension, Clustered Wound: No Hepatitis C, Rheumatoid Arthritis, Paraplegia, Received Chemotherapy Wound Measurements Length: (cm) 5 Width: (cm) 3 Depth: (cm) 0.2 Area: (cm) 11.781 Volume: (cm) 2.356 % Reduction in Area: 69.7% % Reduction in Volume: 79.8% Epithelialization: Medium (34-66%) Tunneling: No Undermining: No Wound Description Classification: Category/Stage II Wound Margin: Distinct, outline attached Exudate Amount: Medium Exudate Type: Serosanguineous Exudate Color: red, brown Foul Odor After Cleansing: No Slough/Fibrino Yes Wound Bed Granulation Amount: Large (67-100%) Exposed Structure Granulation Quality: Red, Hyper-granulation, Friable Fascia Exposed: No Necrotic Amount: Small (1-33%) Fat Layer (Subcutaneous Tissue) Exposed: Yes Necrotic Quality: Eschar Tendon Exposed: No Muscle Exposed: No Joint Exposed: No Bone Exposed: No Periwound Skin Texture Texture Color No Abnormalities Noted: Yes No Abnormalities Noted: Yes Moisture Temperature / Pain No Abnormalities Noted:  Yes Temperature: No Abnormality Electronic Signature(s) Signed: 01/05/2023 7:49:05 AM By: Brenton Grills Entered By: Brenton Grills on 12/02/2022 12:57:42 -------------------------------------------------------------------------------- Wound Assessment Details Patient Name: Date of Service: [facility] RD, [name] 12/02/2022 12:30 PM Medical Record Number: [id number] Patient Account Number: [id number] Date of Birth/Sex: Treating RN: [birth year] (72 y.o. [name] Primary Care Brennden Masten: [name]  MES Other Clinician: Referring Kimmie Doren: Treating Keelan Tripodi/Extender: Tami Ribas, JA MES Weeks in Treatment: 6 Wound Status Wound Number: 6 Primary Pressure Ulcer Etiology: Wound Location: Left, Plantar Foot Wound Open Wounding Event: Blister Status: Date Acquired: 04/26/2022 Comorbid Anemia, Chronic Obstructive Pulmonary Disease (COPD), Weeks Of Treatment: 6 History: Congestive Heart Failure, Coronary Artery Disease, Hypertension, Clustered Wound: No Hepatitis C, Rheumatoid Arthritis, Paraplegia, Received Chemotherapy Photos [name], WAYNER ([id number]) [id number] Page 7 of 7 Wound Measurements Length: (cm) 5 Width: (cm) 3 Depth: (cm) 0.2 Area: (cm) 11.781 Volume: (cm) 2.356 % Reduction in Area: 69.7% % Reduction in Volume: 79.8% Epithelialization: Medium (34-66%) Wound Description Classification: Category/Stage II Wound Margin: Distinct, outline attached Exudate Amount: Medium Exudate Type: Serosanguineous Exudate Color: red, brown Foul Odor After Cleansing: No Slough/Fibrino Yes Wound Bed Granulation Amount: Large (67-100%) Exposed Structure Granulation Quality: Red, Hyper-granulation, Friable Fascia Exposed: No Necrotic Amount: Small (1-33%) Fat Layer (Subcutaneous Tissue) Exposed: Yes Necrotic Quality: Eschar, Adherent Slough Tendon Exposed: No Muscle Exposed: No Joint Exposed: No Bone Exposed: No Periwound Skin  Texture Texture Color No Abnormalities Noted: Yes No Abnormalities Noted: Yes Moisture Temperature / Pain No Abnormalities Noted: Yes Temperature: No Abnormality Electronic Signature(s) Signed: 01/05/2023 7:49:05 AM By: Brenton Grills Entered By: Brenton Grills on 12/02/2022 13:00:05 -------------------------------------------------------------------------------- Vitals Details Patient Name: Date of Service: [name], [name] 12/02/2022 12:30 PM Medical Record Number: [id number] Patient Account Number: [id number] Date of Birth/Sex: Treating RN: [birth year] (72 y.o. [name] Primary Care Merik Mignano: [name] MES Other Clinician: Referring Rashon Rezek: Treating Deeandra Jerry/Extender: Tami Ribas, JA MES Weeks in Treatment: 6 Vital Signs Time Taken: 12:42 Temperature (F): 98 Height (in): 65 Pulse (bpm): 78 Weight (lbs): 167 Respiratory Rate (breaths/min): 20 Body Mass Index (BMI): 27.8 Blood Pressure (mmHg): 98/64 Reference Range: 80 - 120 mg / dl Electronic Signature(s) Signed: 01/05/2023 7:49:05 AM By: Brenton Grills Entered By: Brenton Grills on 12/02/2022 12:44:39

## 2023-01-05 NOTE — Progress Notes (Signed)
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 continues to improve, although it looks like the dry skin peeled off and opened up some superficial areas along the plantar aspect of her foot. There is still some depth at the posterior calcaneus. No concern for infection. I used a curette to debride slough from the wound. We will continue silver alginate to the wound heel cup. She should continue to offload, float her heel, and wear a Prevalon boot. Follow-up in 2 weeks. Electronic Signature(s) Signed: 12/16/2022 3:27:01 PM By: Duanne Guess MD FACS Previous Signature: 12/16/2022 3:26:31 PM Version By: Duanne Guess MD FACS Entered By: Duanne Guess on 12/16/2022 15:27:01 -------------------------------------------------------------------------------- HxROS Details Patient Name: Date of Service: [name], [name] 12/16/2022 2:00 PM Medical Record Number: [id number] Patient Account Number: [id number] Date of Birth/Sex: Treating RN: Nov 12, 1950 (72 y.o. F) Primary Care Provider:  [name] MES Other Clinician: Referring Provider: Treating Provider/Extender: Tami Ribas, JA MES Weeks in Treatment: 8 Information Obtained From Patient Hematologic/Lymphatic Medical History: Positive for: Anemia Respiratory Medical History: Positive for: Chronic Obstructive Pulmonary Disease (COPD) Cardiovascular Medical History: Positive for: Congestive Heart Failure; Coronary Artery Disease; Hypertension Gastrointestinal Medical History: Positive for: Hepatitis C Past Medical History Notes: gastroparesis RASHEIDA, MANUKYAN ([id number]) [id number] Page 8 of 9 Genitourinary Medical History: Past Medical History Notes: Neurogenic Bladder-Indwelling Catheter, CKD Musculoskeletal Medical History: Positive for: Rheumatoid Arthritis Neurologic Medical History: Positive for: Paraplegia Oncologic Medical History: Positive for: Received Chemotherapy Past Medical History Notes: Breast Cancer Immunizations Pneumococcal Vaccine: Received Pneumococcal Vaccination: No Implantable Devices None Hospitalization / Surgery History Type of Hospitalization/Surgery 07/27/21-08/01/21 r/t 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 Family and Social History Cancer: No; Diabetes: No; Heart Disease: No; Hereditary Spherocytosis: No; Hypertension: Yes - Mother,Siblings,Maternal Grandparents; Kidney Disease: No; Lung Disease: No; Seizures: No; Stroke: No; Thyroid Problems: No; Tuberculosis: No; Former smoker; Marital Status - Single; Alcohol Use: Rarely; Drug Use: No History; Caffeine Use: Daily; Financial Concerns: No; Food, Clothing or Shelter Needs: No; Support System Lacking: No; Transportation Concerns: No Psychologist, prison and probation services) Signed: 12/16/2022 3:48:28 PM By: Duanne Guess MD FACS Entered By: Duanne Guess on 12/16/2022 15:22:57 -------------------------------------------------------------------------------- SuperBill Details Patient Name:  Date of Service: [facility] RD, [name] 12/16/2022 Medical Record Number: [id number] Patient Account Number: [id number] Date of Birth/Sex: Treating RN: March 15, 1951 (72 y.o. 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 G90.9 Disorder of the autonomic nervous system, unspecified Facility Procedures : [facility] CPT4 Code: [id number] , Mahkayla B ([id number]) ICD Description: 11042 - DEB SUBQ TISSUE 20 SQ CM/< [id number] -10 Diagnosis Description L89.623 Pressure ulcer of left heel, stage 3 Modifier: [id number] Quantity: 1 1227.pdf Page 9 of 9 Physician Procedures : CPT4 Code Description Modifier [id number] 99214 - WC PHYS LEVEL 4 - EST PT 25 ICD-10 Diagnosis Description L89.623 Pressure ulcer of left heel, 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

## 2023-01-05 NOTE — Progress Notes (Signed)
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 using gauze sponges, not tissue or cotton balls. Prim Dressing: Maxorb Extra Ag+ Alginate Dressing, 4x4.75 (in/in) Every Other Day/30 Days ary Discharge Instructions: Apply to wound bed as instructed Secondary Dressing: ALLEVYN Heel 4 1/2in x 5 1/2in / 10.5cm x 13.5cm Every Other Day/30 Days Discharge Instructions: Apply over gauze and primary dressing as directed. Secondary Dressing: Woven Gauze Sponge, Non-Sterile 4x4 in Every Other Day/30 Days Discharge Instructions: Apply over primary dressing as directed. 12/02/2022: There has been a lot more epithelialization of her wound and there are only 2 small remaining spots open. There is an area at the middle of her calcaneus and an area at the posterior aspect of her calcaneus. Both areas have some slough buildup. I used a curette to debride the slough off of the wound surfaces. I also manually debrided the thick loose dry skin, some of which also required the use of scissors and  forceps. Will continue silver alginate with a foam heel cup and Prevalon boots. Follow-up in 2 weeks. Electronic Signature(s) Signed: 12/02/2022 1:52:50 PM By: Duanne Guess MD FACS Entered By: Duanne Guess on 12/02/2022 13:52:49 -------------------------------------------------------------------------------- HxROS Details Patient Name: Date of Service: [name], [name] 12/02/2022 12:30 PM Medical Record Number: [id number] Patient Account Number: [id number] Date of Birth/Sex: Treating RN: Jun 22, 1951 (72 y.o. F) Primary Care Provider: [name] MES Other Clinician: Referring Provider: Treating Provider/Extender: Tami Ribas, JA MES Weeks in Treatment: 6 Information Obtained From Patient Hematologic/Lymphatic Medical History: Positive for: Anemia Respiratory Medical History: Positive for: Chronic Obstructive Pulmonary Disease (COPD) Cardiovascular Medical History: Positive for: Congestive Heart Failure; Coronary Artery Disease; Hypertension Gastrointestinal Medical History: Positive for: Hepatitis C Past Medical History Notes: gastroparesis AALIYIAH, GUSTAVE ([id number]) [id number] Page 8 of 9 Genitourinary Medical History: Past Medical History Notes: Neurogenic Bladder-Indwelling Catheter, CKD Musculoskeletal Medical History: Positive for: Rheumatoid Arthritis Neurologic Medical History: Positive for: Paraplegia Oncologic Medical History: Positive for: Received Chemotherapy Past Medical History Notes: Breast Cancer Immunizations Pneumococcal Vaccine: Received Pneumococcal Vaccination: No Implantable Devices None Hospitalization / Surgery History Type of Hospitalization/Surgery 07/27/21-08/01/21 r/t 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 Family and Social History Cancer: No; Diabetes: No; Heart Disease: No; Hereditary Spherocytosis: No; Hypertension: Yes - Mother,Siblings,Maternal Grandparents; Kidney Disease: No;  Lung Disease: No; Seizures: No; Stroke: No; Thyroid Problems: No; Tuberculosis: No; Former smoker; Marital Status - Single; Alcohol Use: Rarely; Drug Use: No History; Caffeine Use: Daily; Financial Concerns: No; Food, Clothing or Shelter Needs: No; Support System Lacking: No; Transportation Concerns: No Psychologist, prison and probation services) Signed: 12/02/2022 4:23:55 PM By: Duanne Guess MD FACS Entered By: Duanne Guess on 12/02/2022 13:50:37 -------------------------------------------------------------------------------- SuperBill Details Patient Name: Date of Service: [name], [name] 12/02/2022 Medical Record Number: [id number] Patient Account Number: [id number] Date of Birth/Sex: Treating RN: 20-Jul-1951 (72 y.o. 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 G90.9 Disorder of the autonomic nervous system, unspecified Facility Procedures : [facility] CPT4 Code: , Whitni  B ([id number]) [id number] IC L Description: [phone] 597 - DEBRIDE WOUND 1ST 20 SQ CM OR < D-10 Diagnosis Description 89.623 Pressure ulcer of left heel, stage 3 Modifier: [id number] Quantity: 27.pdf Page 9 of 9 Physician Procedures : CPT4 Code Description Modifier [id number] 99213 - WC PHYS LEVEL 3 - EST PT 25 ICD-10 Diagnosis Description L89.623 Pressure ulcer of left heel, 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

## 2023-01-05 NOTE — Progress Notes (Signed)
[name], [name] ([id number]) [id number] Page 1 of 6 Visit Report for 12/16/2022 Arrival Information Details Patient Name: Date of Service: [name] [facility] RD, [name] 12/16/2022 2:00 PM Medical Record Number: [id number] Patient Account Number: [id number] Date of Birth/Sex: Treating RN: [birth year] (72 y.o. F) Primary Care [name]: [name] MES Other Clinician: Referring [name]: Treating [name]/Extender: [name], JA MES Weeks in Treatment: 8 Visit Information History Since Last Visit All ordered tests and consults were completed: No Patient Arrived: Wheel Chair Added or deleted any medications: No Arrival Time: 14:05 Any new allergies or adverse reactions: No Accompanied By: self Had a fall or experienced change in No Transfer Assistance: Nurse, adult activities of daily living that may affect Patient Identification Verified: Yes risk of falls: Secondary Verification Process Completed: Yes Signs or symptoms of abuse/neglect since last visito No Patient Requires Transmission-Based Precautions: No Hospitalized since last visit: No Patient Has Alerts: No Implantable device outside of the clinic excluding No cellular tissue based products placed in the center since last visit: Pain Present Now: No Electronic Signature(s) Signed: 12/16/2022 2:48:29 PM By: [name] Entered By: [name] on 12/16/2022 14:05:52 -------------------------------------------------------------------------------- Encounter Discharge Information Details Patient Name: Date of Service: [name], [name] 12/16/2022 2:00 PM Medical Record Number: [id number] Patient Account Number: [id number] Date of Birth/Sex: Treating RN: 08-Oct-1950 (72 y.o. [name] Primary Care [name]: [name] MES Other Clinician: Referring Pearlina Friedly: Treating Jaques Mineer/Extender: [name], JA MES Weeks in Treatment: 8 Encounter Discharge Information Items Post  Procedure Vitals Discharge Condition: Stable Temperature (F): 98 Ambulatory Status: Wheelchair Pulse (bpm): 72 Discharge Destination: Skilled Nursing Facility Respiratory Rate (breaths/min): 18 Telephoned: No Blood Pressure (mmHg): 138/72 Orders Sent: Yes Transportation: Private Auto Accompanied By: self Schedule Follow-up Appointment: Yes Clinical Summary of Care: Patient Declined Electronic Signature(s) Signed: 01/05/2023 7:50:28 AM By: [name] Entered By: [name] on 12/16/2022 15:25:35 -------------------------------------------------------------------------------- Lower Extremity Assessment Details Patient Name: Date of Service: [name], [name] 12/16/2022 2:00 PM Medical Record Number: [id number] Patient Account Number: [id number] Date of Birth/Sex: Treating RN: July 21, 1951 (72 y.o. [name] Primary Care [name]: [name] MES Other Clinician: Referring [name]: Treating [name]/Extender: [name], JA MES Weeks in Treatment: 8 Edema Assessment Left: [Left: Right] [Right: :] Assessed: [Left: No] [Right: No] [Left: Edema] [Right: :] Calf Left: Right: Point of Measurement: From Medial Instep 30.4 cm 30.6 cm Ankle Left: Right: Point of Measurement: From Medial Instep 21 cm 19.3 cm Vascular Assessment Pulses: Dorsalis Pedis Palpable: [Left:Yes] [Right:Yes] Electronic Signature(s) Signed: 01/05/2023 7:50:28 AM By: [name] Entered By: [name] on 12/16/2022 14:33:08 -------------------------------------------------------------------------------- Multi Wound Chart Details Patient Name: Date of Service: [name], [name] 12/16/2022 2:00 PM Medical Record Number: [id number] Patient Account Number: [id number] Date of Birth/Sex: Treating RN: August 24, 1950 (72 y.o. F) Primary Care [name]: [name] MES Other Clinician: Referring [name]: Treating [name]/Extender: [name], JA MES Weeks in  Treatment: 8 Vital Signs Height(in): 65 Pulse(bpm): 74 Weight(lbs): 167 Blood Pressure(mmHg): 89/57 Body Mass Index(BMI): 27.8 Temperature(F): 97.7 Respiratory Rate(breaths/min): 20 [6:Photos: No Photos Left, Plantar Foot Wound Location: Blister Wounding Event: Pressure Ulcer Primary Etiology: Anemia, Chronic Obstructive Comorbid History: Pulmonary Disease (COPD), Congestive Heart Failure, Coronary Artery Disease, Hypertension, Hepatitis C,  Rheumatoid Arthritis, Paraplegia, Received Chemotherapy 04/26/2022 Date Acquired: 8 Weeks of Treatment: Open Wound Status: No Wound Recurrence: 4.2x1.8x0.2 Measurements L x W x D (cm) 5.938 A (cm) : rea 1.188 Volume (cm) : 84.70% %  Reduction in A rea:  89.80% % Reduction in Volume: Category/Stage II Classification: Medium Exudate A mount: Serosanguineous Exudate Type: red, brown Exudate Color: Distinct, outline attached Wound Margin: Large (67-100%) Granulation A mount: Red, Hyper-granulation, Friable  Granulation Quality: Small (1-33%) Necrotic A mount: Eschar Necrotic Tissue: Fat Layer (Subcutaneous Tissue): Yes N/A Exposed Structures: Fascia: No Tendon: No Muscle: No Joint: No] [N/A:N/A N/A N/A N/A N/A N/A N/A N/A N/A N/A N/A N/A N/A N/A N/A N/A N/A  N/A N/A N/A N/A N/A N/A] [name], [name] ([id number]) [6:Bone: No Medium (34-66%) Epithelialization: Debridement - Excisional Debridement: 15:05 Pre-procedure Verification/Time Out Taken: Lidocaine 4% Topical Solution Pain Control: Subcutaneous, Slough Tissue Debrided: Skin/Subcutaneous Tissue Level: 5.93  Debridement A (sq cm): rea Curette Instrument: Minimum Bleeding: Pressure Hemostasis A chieved: 0 Procedural Pain: 0 Post Procedural Pain: Procedure was tolerated well Debridement Treatment Response: 4.2x1.8x0.2 Post Debridement Measurements L x W x D  (cm) 1.188 Post Debridement Volume: (cm) Category/Stage II Post Debridement Stage: No Abnormalities Noted Periwound Skin Texture: No Abnormalities Noted  Periwound Skin Moisture: No Abnormalities Noted Periwound Skin Color: No Abnormality Temperature:  Debridement Procedures Performed:] [N/A:N/A N/A N/A N/A N/A N/A N/A N/A N/A N/A N/A N/A N/A N/A N/A N/A N/A N/A N/A N/A N/A] Treatment Notes Electronic Signature(s) Signed: 12/16/2022 3:20:51 PM By: Duanne Guess MD FACS Entered By: Duanne Guess on 12/16/2022 15:20:51 -------------------------------------------------------------------------------- Multi-Disciplinary Care Plan Details Patient Name: Date of Service: [facility] RD, [name] 12/16/2022 2:00 PM Medical Record Number: [id number] Patient Account Number: [id number] Date of Birth/Sex: Treating RN: Jul 14, 1951 (72 y.o. [name] Primary Care Desiderio Dolata: [name] MES Other Clinician: Referring Maleeka Sabatino: Treating Jenaya Saar/Extender: [name], JA MES Weeks in Treatment: 8 Active Inactive Nutrition Nursing Diagnoses: Imbalanced nutrition Potential for alteratiion in Nutrition/Potential for imbalanced nutrition Goals: Patient/caregiver agrees to and verbalizes understanding of need to use nutritional supplements and/or vitamins as prescribed Date Initiated: 10/21/2022 Target Resolution Date: 12/04/2022 Goal Status: Active Interventions: Assess patient nutrition upon admission and as needed per policy Notes: Wound/Skin Impairment Nursing Diagnoses: Impaired tissue integrity Knowledge deficit related to ulceration/compromised skin integrity Goals: Patient/caregiver will verbalize understanding of skin care regimen Date Initiated: 10/21/2022 Target Resolution Date: 12/04/2022 Goal Status: Active Interventions: Assess ulceration(s) every visit Treatment Activities: [name], [name] ([id number]) [phone].pdf Page 4 of 6 Skin care regimen initiated : 10/21/2022 Topical wound management initiated : 10/21/2022 Notes: Electronic Signature(s) Signed: 01/05/2023 7:50:28 AM By: [name] Entered By: [name] on 12/16/2022 14:50:39 -------------------------------------------------------------------------------- Pain Assessment Details Patient Name: Date of Service: [facility] RD, [name] 12/16/2022 2:00 PM Medical Record Number: [id number] Patient Account Number: [id number] Date of Birth/Sex: Treating RN: 09/11/1950 (72 y.o. F) Primary Care Sydny Schnitzler: [name] MES Other Clinician: Referring Maigan Bittinger: Treating Michal Callicott/Extender: [name], JA MES Weeks in Treatment: 8 Active Problems Location of Pain Severity and Description of Pain Patient Has Paino No Site Locations Pain Management and Medication Current Pain Management: Electronic Signature(s) Signed: 12/16/2022 2:48:29 PM By: [name] Entered By: [name] on 12/16/2022 14:06:36 -------------------------------------------------------------------------------- Patient/Caregiver Education Details Patient Name: Date of Service: [name] [facility] RD, [name] 5/22/2024andnbsp2:00 PM Medical Record Number: [id number] Patient Account Number: [id number] Date of Birth/Gender: Treating RN: 01/17/1951 (72 y.o. [name] Primary Care Physician: [name] MES Other Clinician: Referring Physician: Treating Physician/Extender: [name], JA MES Weeks in Treatment: 8 Education Assessment Education Provided To: Patient Education Topics Provided Wound/Skin Impairment: Methods: Explain/Verbal [name], [name] ([id number]) [id number] Page 5  of 6 Responses: State content correctly Electronic Signature(s) Signed: 01/05/2023 7:50:28 AM By: [name] Entered By: [name] on 12/16/2022 14:51:22 -------------------------------------------------------------------------------- Wound Assessment Details Patient Name: Date of Service: [name] [facility] RD, [name] 12/16/2022 2:00 PM Medical Record Number: [id number] Patient Account Number: [id number] Date of  Birth/Sex: Treating RN: 12-Nov-1950 (72 y.o. [name] Primary Care Mahalia Dykes: [name] MES Other Clinician: Referring Zakariyya Helfman: Treating [name]/Extender: [name], JA MES Weeks in Treatment: 8 Wound Status Wound Number: 6 Primary Pressure Ulcer Etiology: Wound Location: Left, Plantar Foot Wound Open Wounding Event: Blister Status: Date Acquired: 04/26/2022 Comorbid Anemia, Chronic Obstructive Pulmonary Disease (COPD), Weeks Of Treatment: 8 History: Congestive Heart Failure, Coronary Artery Disease, Hypertension, Clustered Wound: No Hepatitis C, Rheumatoid Arthritis, Paraplegia, Received Chemotherapy Wound Measurements Length: (cm) 4.2 Width: (cm) 1.8 Depth: (cm) 0.2 Area: (cm) 5.938 Volume: (cm) 1.188 % Reduction in Area: 84.7% % Reduction in Volume: 89.8% Epithelialization: Medium (34-66%) Tunneling: No Undermining: No Wound Description Classification: Category/Stage II Wound Margin: Distinct, outline attached Exudate Amount: Medium Exudate Type: Serosanguineous Exudate Color: red, brown Foul Odor After Cleansing: No Slough/Fibrino Yes Wound Bed Granulation Amount: Large (67-100%) Exposed Structure Granulation Quality: Red, Hyper-granulation, Friable Fascia Exposed: No Necrotic Amount: Small (1-33%) Fat Layer (Subcutaneous Tissue) Exposed: Yes Necrotic Quality: Eschar Tendon Exposed: No Muscle Exposed: No Joint Exposed: No Bone Exposed: No Periwound Skin Texture Texture Color No Abnormalities Noted: Yes No Abnormalities Noted: Yes Moisture Temperature / Pain No Abnormalities Noted: Yes Temperature: No Abnormality Electronic Signature(s) Signed: 01/05/2023 7:50:28 AM By: [name] Previous Signature: 12/16/2022 2:48:29 PM Version By: [name] Entered By: [name] on 12/16/2022 14:50:01 -------------------------------------------------------------------------------- Vitals Details Patient Name: Date of Service: [name] WFO  RD, [name] 12/16/2022 2:00 PM Medical Record Number: [id number] Patient Account Number: [id number] Date of Birth/Sex: Treating RN: 06/15/1951 (72 y.o. F) Primary Care Emylia Latella: [name] MES Other Clinician: Referring Norman Piacentini: Treating Laddie Math/Extender: [name], JA MES [place], [place] B ([id number]) [id number] Page 6 of 6 Weeks in Treatment: 8 Vital Signs Time Taken: 02:05 Temperature (F): 97.7 Height (in): 65 Pulse (bpm): 74 Weight (lbs): 167 Respiratory Rate (breaths/min): 20 Body Mass Index (BMI): 27.8 Blood Pressure (mmHg): 89/57 Reference Range: 80 - 120 mg / dl Electronic Signature(s) Signed: 12/16/2022 2:48:29 PM By: [name] Entered By: [name] on 12/16/2022 14:06:30

## 2023-01-07 ENCOUNTER — Ambulatory Visit: Payer: 59 | Admitting: Orthopedic Surgery

## 2023-01-11 ENCOUNTER — Encounter: Payer: 59 | Attending: Physical Medicine and Rehabilitation | Admitting: Physical Medicine and Rehabilitation

## 2023-01-11 ENCOUNTER — Encounter: Payer: Self-pay | Admitting: Physical Medicine and Rehabilitation

## 2023-01-11 VITALS — BP 96/65 | HR 86

## 2023-01-11 DIAGNOSIS — M7918 Myalgia, other site: Secondary | ICD-10-CM

## 2023-01-11 DIAGNOSIS — R252 Cramp and spasm: Secondary | ICD-10-CM

## 2023-01-11 DIAGNOSIS — G8220 Paraplegia, unspecified: Secondary | ICD-10-CM

## 2023-01-11 DIAGNOSIS — S91302A Unspecified open wound, left foot, initial encounter: Secondary | ICD-10-CM | POA: Diagnosis present

## 2023-01-11 DIAGNOSIS — Z993 Dependence on wheelchair: Secondary | ICD-10-CM
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
## 2023-01-12 ENCOUNTER — Encounter (HOSPITAL_BASED_OUTPATIENT_CLINIC_OR_DEPARTMENT_OTHER): Payer: 59 | Admitting: General Surgery

## 2023-01-14 ENCOUNTER — Ambulatory Visit: Payer: 59 | Admitting: Orthopedic Surgery

## 2023-01-26 ENCOUNTER — Ambulatory Visit (HOSPITAL_COMMUNITY): Payer: 59

## 2023-02-05 ENCOUNTER — Ambulatory Visit: Admission: RE | Admit: 2023-02-05 | Payer: 59 | Source: Ambulatory Visit

## 2023-02-05 DIAGNOSIS — M810 Age-related osteoporosis without current pathological fracture: Secondary | ICD-10-CM

## 2023-02-19 IMAGING — DX DG CHEST 2V
2 series · 2 of 2 positions shown · non-contrast
Comparison: February 05, 2021 and December 08, 2020

CLINICAL DATA: Dyspnea

EXAM:
CHEST - 2 VIEW

[chest ap]
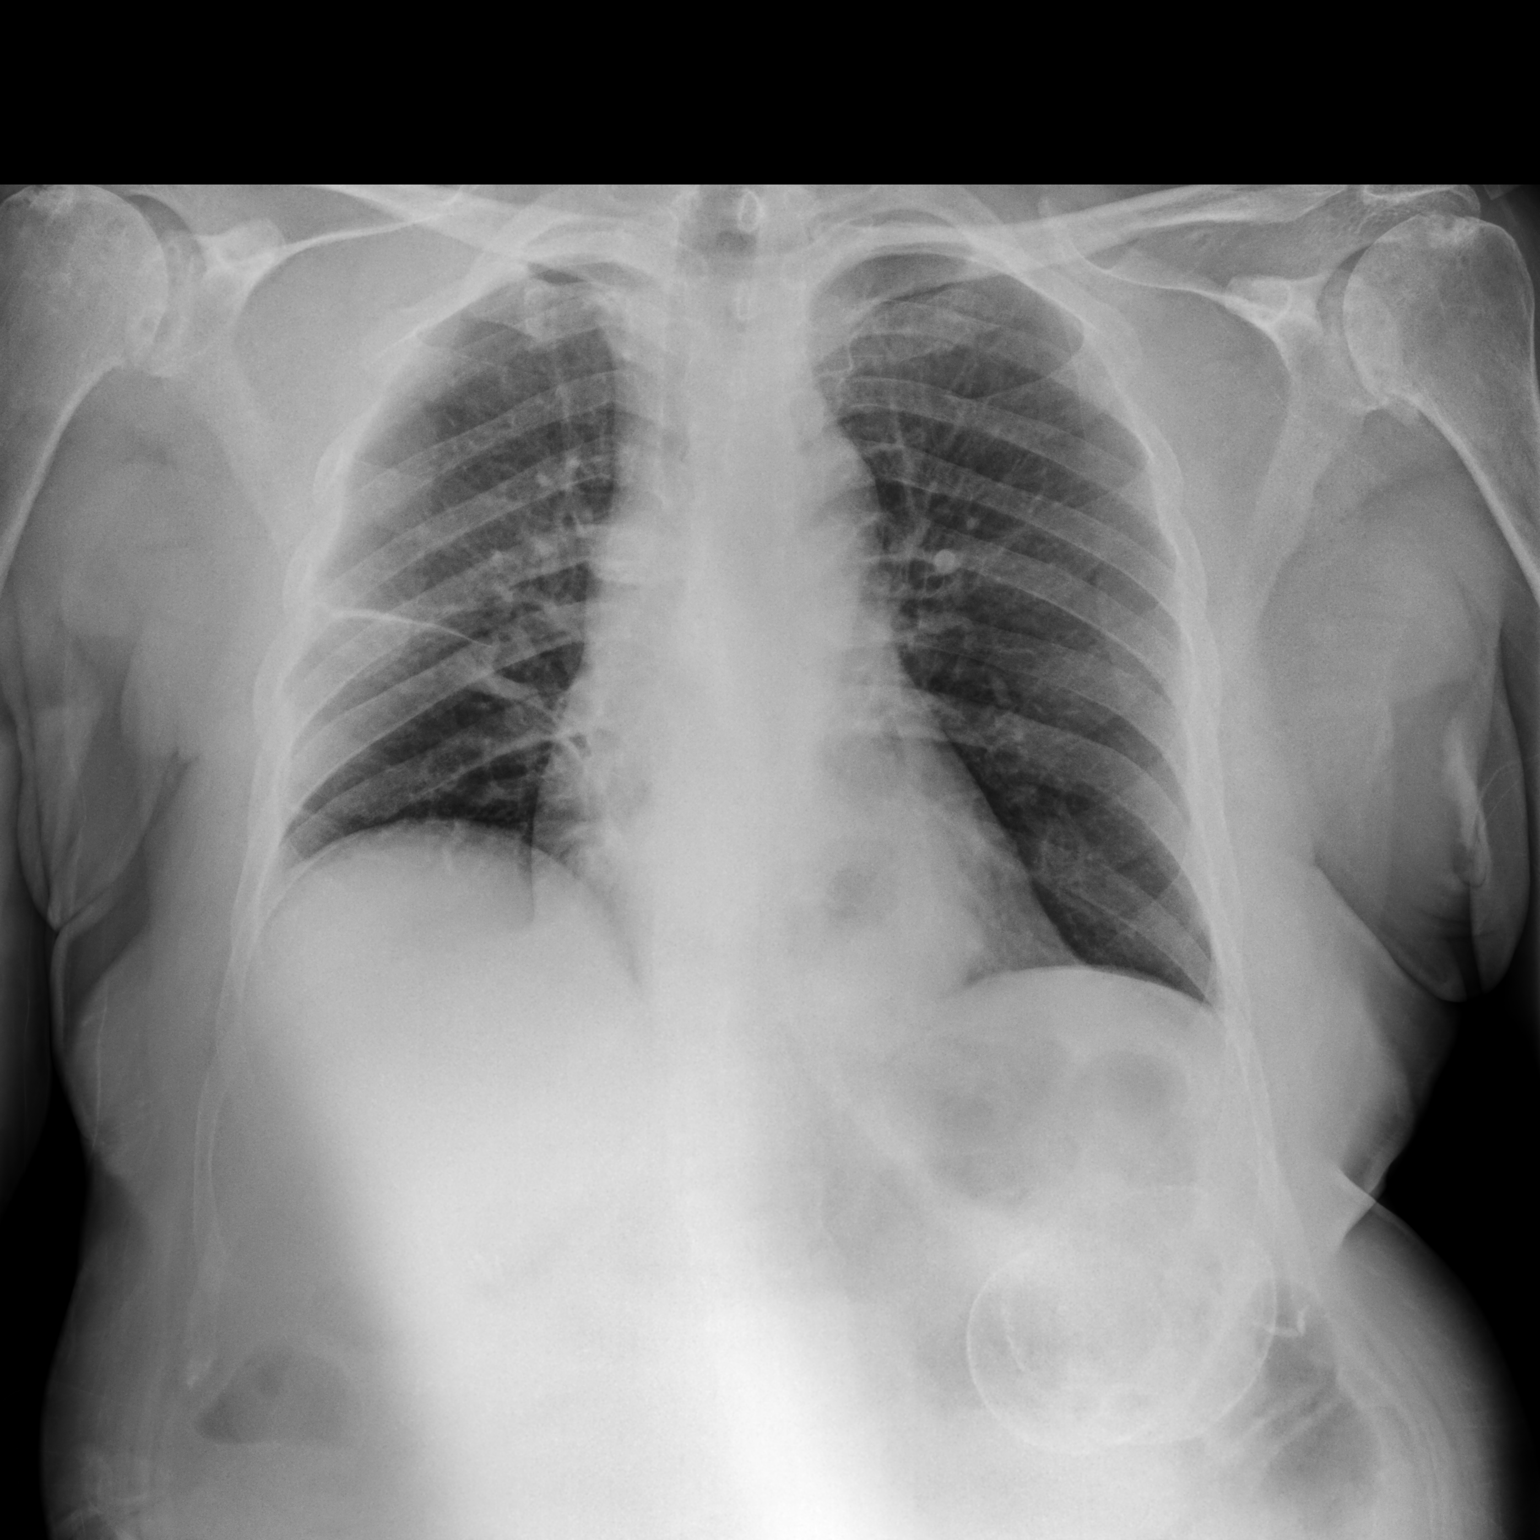

[chest lat]
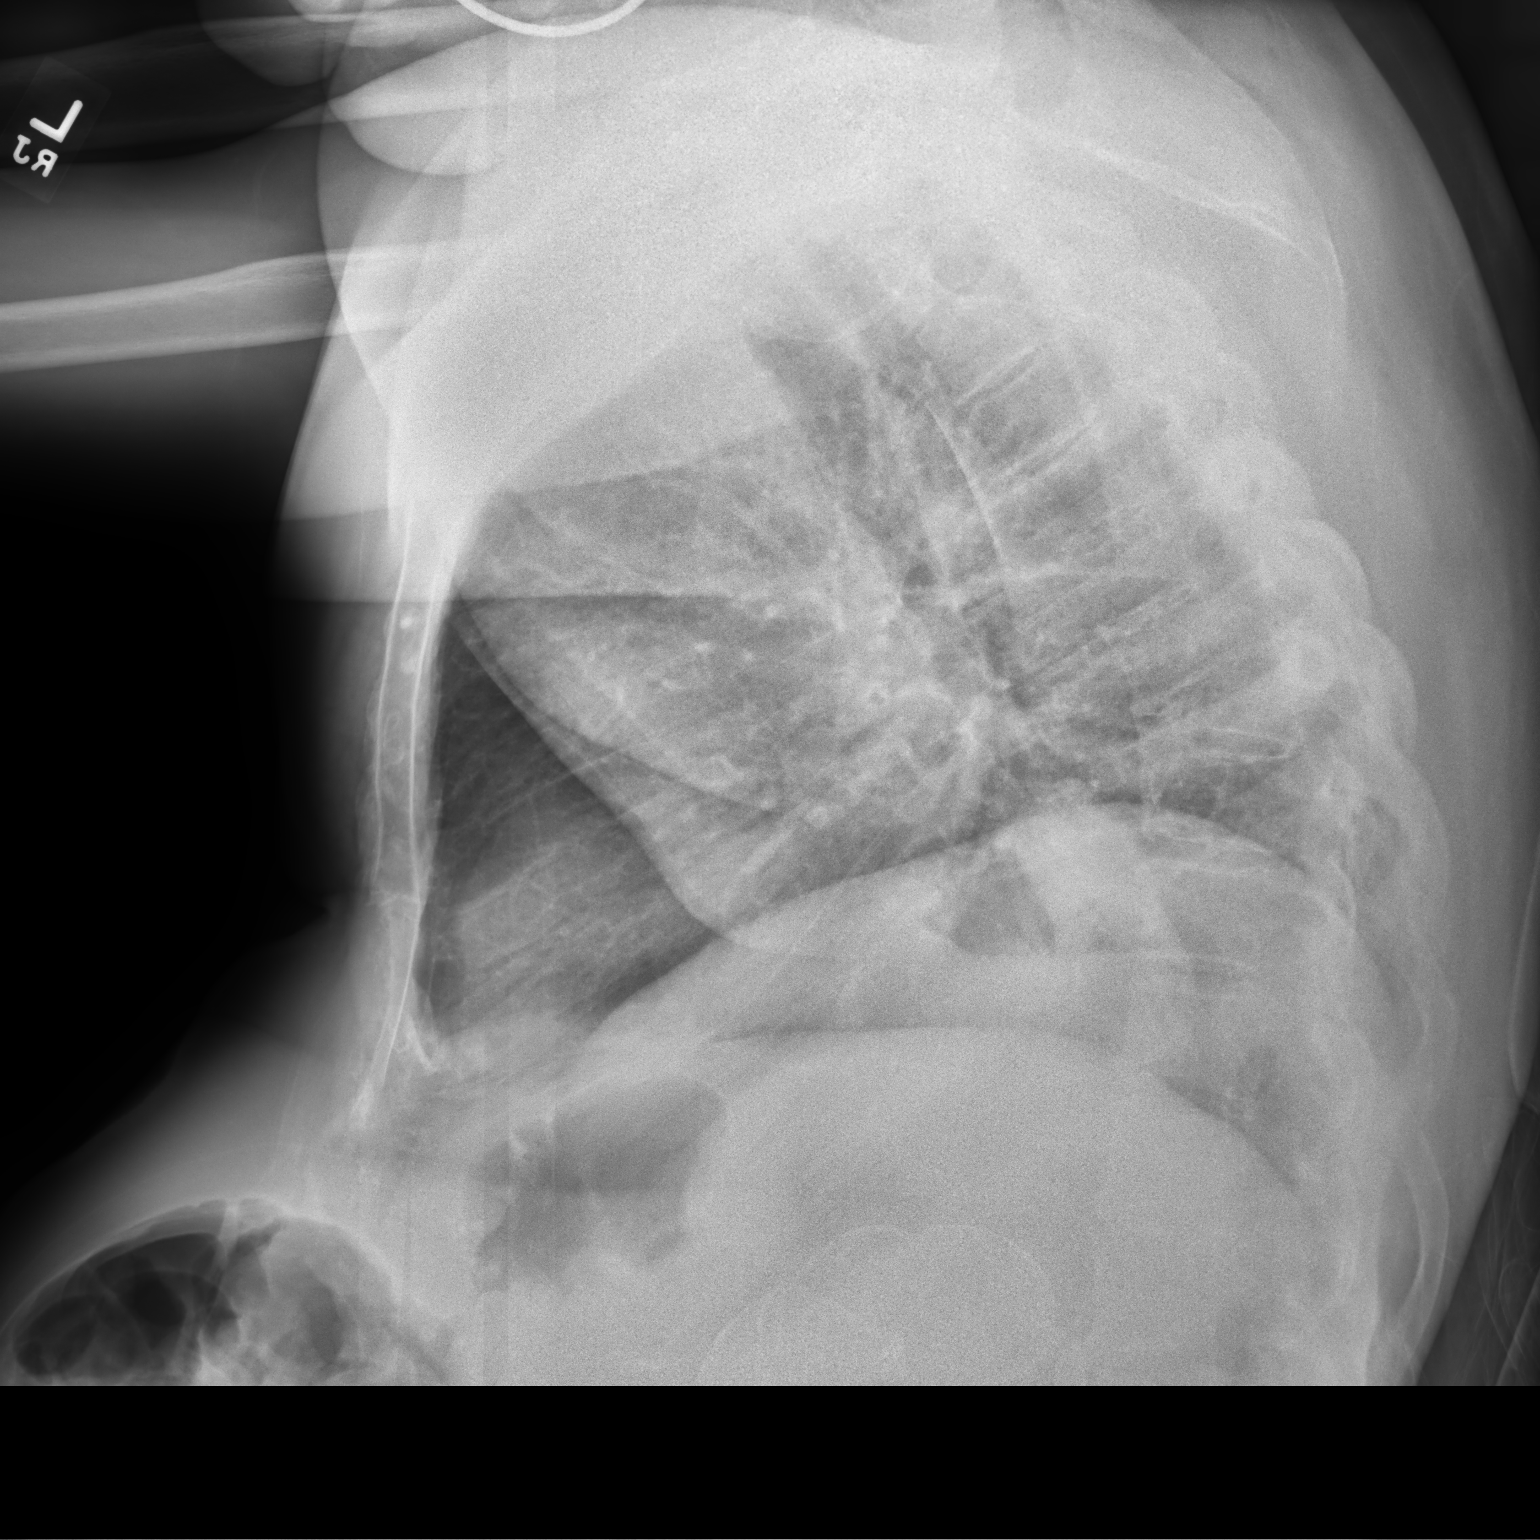

[2 of 2 positions shown; findings below may reference images not displayed]

FINDINGS: The heart size and mediastinal contours are within normal limits.
Hiatal hernia. Eventration of the right hemidiaphragm. Linear band
of atelectasis in the right mid lung. No airspace consolidation. No
pleural effusion. No pneumothorax. Similar focal kyphosis about a
remote T8 compression deformity. Degenerative changes bilateral
shoulders. 8.8 cm peripherally calcified splenic lesion, unchanged.
IMPRESSION: 1. No active cardiopulmonary disease. Linear band of atelectasis in
the right mid lung.
2. Hiatal hernia.

## 2023-02-24 ENCOUNTER — Inpatient Hospital Stay: Payer: 59 | Attending: Oncology | Admitting: Oncology

## 2023-02-24 VITALS — BP 125/74 | HR 83 | Temp 97.9°F | Resp 18 | Wt 164.0 lb

## 2023-02-24 DIAGNOSIS — Z853 Personal history of malignant neoplasm of breast: Secondary | ICD-10-CM | POA: Insufficient documentation

## 2023-02-24 DIAGNOSIS — Z9013 Acquired absence of bilateral breasts and nipples: Secondary | ICD-10-CM | POA: Diagnosis not present

## 2023-02-24 DIAGNOSIS — K760 Fatty (change of) liver, not elsewhere classified: Secondary | ICD-10-CM | POA: Diagnosis not present

## 2023-02-24 DIAGNOSIS — G8220 Paraplegia, unspecified: Secondary | ICD-10-CM | POA: Insufficient documentation

## 2023-02-24 DIAGNOSIS — C220 Liver cell carcinoma: Secondary | ICD-10-CM | POA: Insufficient documentation

## 2023-02-24 DIAGNOSIS — K7460 Unspecified cirrhosis of liver: Secondary | ICD-10-CM | POA: Diagnosis not present
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
## 2023-02-24 NOTE — Addendum Note (Signed)
Addended by: Wandalee Ferdinand on: 02/24/2023 02:59 PM   Modules accepted: Orders

## 2023-03-25 NOTE — Progress Notes (Signed)
[name], [name] ([id number]) [id number] Page 1 of 9 Visit Report for 12/30/2022 Arrival Information Details Patient Name: Date of Service: [name] 12/30/2022 2:00 PM Medical Record Number: [id number] Patient Account Number: [id number] Date of Birth/Sex: Treating RN: Jul 06, 1951 (72 y.o. F) Primary Care Detravion Tester: [name] [name] Other Clinician: Referring Lavonta Tillis: Treating [name]/Extender: [name], [name] Weeks in Treatment: 10 Visit Information History Since Last Visit All ordered tests and consults were completed: No Patient Arrived: Wheel Chair Added or deleted any medications: No Arrival Time: 13:52 Any new allergies or adverse reactions: No Accompanied By: self Had a fall or experienced change in No Transfer Assistance: None activities of daily living that may affect Patient Identification Verified: Yes risk of falls: Secondary Verification Process Completed: Yes Signs or symptoms of abuse/neglect since last visito No Patient Requires Transmission-Based Precautions: No Hospitalized since last visit: No Patient Has Alerts: No Implantable device outside of the clinic excluding No cellular tissue based products placed in the center since last visit: Has Footwear/Offloading in Place as Prescribed: Yes Left: Other:prevalon boots Right: Other:prevalon boots Pain Present Now: No Electronic Signature(s) Signed: 12/30/2022 4:18:00 PM By: [name] Entered By: [name] on 12/30/2022 13:55:53 -------------------------------------------------------------------------------- Encounter Discharge Information Details Patient Name: Date of Service: [name] 12/30/2022 2:00 PM Medical Record Number: [id number] Patient Account Number: [id number] Date of Birth/Sex: Treating RN: 1950/12/25 (72 y.o. [name] Primary Care [name]: [name] [name] Other Clinician: Referring Amal Saiki: Treating  [name]/Extender: [name], [name] Weeks in Treatment: 10 Encounter Discharge Information Items Post Procedure Vitals Discharge Condition: Stable Temperature (F): 98 Ambulatory Status: Wheelchair Pulse (bpm): 83 Discharge Destination: Skilled Nursing Facility Respiratory Rate (breaths/min): 18 Telephoned: No Blood Pressure (mmHg): 98/64 Orders Sent: Yes Transportation: Other Accompanied By: self Schedule Follow-up Appointment: Yes Clinical Summary of Care: Patient Declined Notes facility transportation Electronic Signature(s) Signed: 12/30/2022 4:18:00 PM By: [name] [name] ([id number]) [id number] Page 2 of 9 Entered By: [name] on 12/30/2022 15:08:21 -------------------------------------------------------------------------------- Lower Extremity Assessment Details Patient Name: Date of Service: [name] 12/30/2022 2:00 PM Medical Record Number: [id number] Patient Account Number: [id number] Date of Birth/Sex: Treating RN: 16-Oct-1950 (72 y.o. [name] Primary Care [name]: [name] [name] Other Clinician: Referring Kamryn Messineo: Treating [name]/Extender: [name], [name] Weeks in Treatment: 10 Edema Assessment Assessed: [Left: No] [Right: No] Edema: [Left: N] [Right: o] Calf Left: Right: Point of Measurement: From Medial Instep 30.4 cm Ankle Left: Right: Point of Measurement: From Medial Instep 21 cm Vascular Assessment Pulses: Dorsalis Pedis Palpable: [Left:No] Electronic Signature(s) Signed: 12/30/2022 4:18:00 PM By: [name] Entered By: [name] on 12/30/2022 14:17:20 -------------------------------------------------------------------------------- Multi Wound Chart Details Patient Name: Date of Service: [name] 12/30/2022 2:00 PM Medical Record Number: [id number] Patient Account Number: [id number] Date of Birth/Sex:  Treating RN: 30-Apr-1951 (72 y.o. F) Primary Care [name]: [name] [name] Other Clinician: Referring Lynzi Meulemans: Treating [name]/Extender: [name], [name] Weeks in Treatment: 10 Vital Signs Height(in): 65 Pulse(bpm): 83 Weight(lbs): 167 Blood Pressure(mmHg): 98/64 Body Mass Index(BMI): 27.8 Temperature(F): 98.0 Respiratory Rate(breaths/min): 20 [6:Photos:] [9:No Photos [id number] Page 3 of 9] Left, Plantar Foot Left, Posterior Calcaneus Left Metatarsal head fifth Wound Location: Blister Pressure Injury Gradually Appeared Wounding Event: Pressure Ulcer Pressure Ulcer Pressure Ulcer Primary Etiology: Anemia, Chronic Obstructive Anemia, Chronic Obstructive Anemia, Chronic Obstructive Comorbid History: Pulmonary Disease (COPD), Pulmonary  Disease (COPD), Pulmonary Disease (COPD), Congestive Heart Failure, Coronary Congestive Heart Failure, Coronary Congestive Heart Failure, Coronary Artery Disease, Hypertension, Artery Disease, Hypertension, Artery Disease, Hypertension, Hepatitis C, Rheumatoid Arthritis, Hepatitis C, Rheumatoid Arthritis, Hepatitis C, Rheumatoid Arthritis, Paraplegia, Received Chemotherapy Paraplegia, Received Chemotherapy Paraplegia, Received Chemotherapy 04/26/2022 12/03/2022 12/30/2022 Date Acquired: 10 0 0 Weeks of Treatment: Open Open Open Wound Status: No No No Wound Recurrence: 7.2x3.5x0.1 0.6x1x0.3 0.4x0.2x0.1 Measurements L x W x D (cm) 19.792 0.471 0.063 A (cm) : rea 1.979 0.141 0.006 Volume (cm) : 49.00% N/A N/A % Reduction in A rea: 83.00% N/A N/A % Reduction in Volume: Category/Stage II Category/Stage III Category/Stage II Classification: Medium Medium None Present Exudate A mount: Serosanguineous Serosanguineous N/A Exudate Type: red, brown red, brown N/A Exudate Color: Distinct, outline attached Distinct, outline attached Flat and Intact Wound Margin: Large (67-100%) Large (67-100%) Small  (1-33%) Granulation A mount: Red, Hyper-granulation, Friable Red, Friable Pink Granulation Quality: None Present (0%) Small (1-33%) Large (67-100%) Necrotic A mount: N/A Adherent Slough Eschar Necrotic Tissue: Fat Layer (Subcutaneous Tissue): Yes Fat Layer (Subcutaneous Tissue): Yes Fascia: No Exposed Structures: Fascia: No Fascia: No Fat Layer (Subcutaneous Tissue): No Tendon: No Tendon: No Tendon: No Muscle: No Muscle: No Muscle: No Joint: No Joint: No Joint: No Bone: No Bone: No Bone: No Limited to Skin Breakdown Small (1-33%) Small (1-33%) Medium (34-66%) Epithelialization: Debridement - Excisional Debridement - Excisional Debridement - Selective/Open Wound Debridement: Pre-procedure Verification/Time Out 14:30 14:30 14:30 Taken: Subcutaneous, Slough Subcutaneous, Slough Necrotic/Eschar Tissue Debrided: Skin/Subcutaneous Tissue Skin/Subcutaneous Tissue Non-Viable Tissue Level: 15.04 0.47 0.06 Debridement A (sq cm): rea Curette Curette Curette Instrument: Minimum Minimum Minimum Bleeding: Pressure Pressure Pressure Hemostasis A chieved: Insensate Insensate Insensate Procedural Pain: Insensate Insensate Insensate Post Procedural Pain: Procedure was tolerated well Procedure was tolerated well Procedure was tolerated well Debridement Treatment Response: 7.3x3.5x0.1 0.6x1x0.3 0.4x0.2x0.1 Post Debridement Measurements L x W x D (cm) 2.007 0.141 0.006 Post Debridement Volume: (cm) Category/Stage II Category/Stage III Category/Stage II Post Debridement Stage: No Abnormalities Noted Callus: Yes No Abnormalities Noted Periwound Skin Texture: No Abnormalities Noted No Abnormalities Noted No Abnormalities Noted Periwound Skin Moisture: No Abnormalities Noted No Abnormalities Noted No Abnormalities Noted Periwound Skin Color: No Abnormality No Abnormality No Abnormality Temperature: Debridement Debridement Debridement Procedures Performed: Treatment  Notes Wound #6 (Foot) Wound Laterality: Plantar, Left Cleanser Soap and Water Discharge Instruction: May shower and wash wound with dial antibacterial soap and water prior to dressing change. Wound Cleanser Discharge Instruction: Cleanse the wound with wound cleanser prior to applying a clean dressing using gauze sponges, not tissue or cotton balls. Peri-Wound Care Topical Primary Dressing Maxorb Extra Ag+ Alginate Dressing, 4x4.75 (in/in) Discharge Instruction: Apply to wound bed as instructed Secondary Dressing ALLEVYN Heel 4 1/2in x 5 1/2in / 10.5cm x 13.5cm Discharge Instruction: Apply over gauze and primary dressing as directed. [name], [name] ([id number]) [id number] Page 4 of 9 Woven Gauze Sponge, Non-Sterile 4x4 in Discharge Instruction: Apply over primary dressing as directed. Secured With Compression Wrap Compression Stockings Add-Ons Wound #8 (Calcaneus) Wound Laterality: Left, Posterior Cleanser Soap and Water Discharge Instruction: May shower and wash wound with dial antibacterial soap and water prior to dressing change. Wound Cleanser Discharge Instruction: Cleanse the wound with wound cleanser prior to applying a clean dressing using gauze sponges, not tissue or cotton balls. Peri-Wound Care Topical Primary Dressing Maxorb Extra Ag+ Alginate Dressing, 4x4.75 (in/in) Discharge Instruction: Apply to wound bed as instructed Secondary Dressing ALLEVYN Heel 4 1/2in x 5 1/2in /  10.5cm x 13.5cm Discharge Instruction: Apply over gauze and primary dressing as directed. Woven Gauze Sponge, Non-Sterile 4x4 in Discharge Instruction: Apply over primary dressing as directed. Secured With Compression Wrap Compression Stockings Add-Ons Wound #9 (Metatarsal head fifth) Wound Laterality: Left Cleanser Soap and Water Discharge Instruction: May shower and wash wound with dial antibacterial soap and water prior to dressing change. Wound  Cleanser Discharge Instruction: Cleanse the wound with wound cleanser prior to applying a clean dressing using gauze sponges, not tissue or cotton balls. Peri-Wound Care Topical Primary Dressing Maxorb Extra Ag+ Alginate Dressing, 4x4.75 (in/in) Discharge Instruction: Apply to wound bed as instructed Secondary Dressing ALLEVYN Heel 4 1/2in x 5 1/2in / 10.5cm x 13.5cm Discharge Instruction: Apply over gauze and primary dressing as directed. Woven Gauze Sponge, Non-Sterile 4x4 in Discharge Instruction: Apply over primary dressing as directed. Secured With Compression Wrap Compression Stockings Facilities manager) Signed: 12/30/2022 3:16:54 PM By: Duanne Guess MD FACS Entered By: Duanne Guess on 12/30/2022 15:16:54 [place], Reita Chard ([id number]) [id number] Page 5 of 9 -------------------------------------------------------------------------------- Multi-Disciplinary Care Plan Details Patient Name: Date of Service: [name] [facility] RD, [name]. 12/30/2022 2:00 PM Medical Record Number: [id number] Patient Account Number: [id number] Date of Birth/Sex: Treating RN: 11/12/1950 (72 y.o. [name] Primary Care Jesper Stirewalt: [name] [name] Other Clinician: Referring Holley Wirt: Treating Song Myre/Extender: [name], [name] Weeks in Treatment: 10 Multidisciplinary Care Plan reviewed with physician Active Inactive Electronic Signature(s) Signed: 03/25/2023 8:48:35 AM By: [name] Previous Signature: 12/30/2022 4:18:00 PM Version By: [name] Entered By: [name] on 02/04/2023 16:48:45 -------------------------------------------------------------------------------- Pain Assessment Details Patient Name: Date of Service: [name] 12/30/2022 2:00 PM Medical Record Number: [id number] Patient Account Number: [id number] Date of Birth/Sex: Treating RN: 1951/05/23 (72 y.o. F) Primary Care Ayame Rena:  [name] [name] Other Clinician: Referring Marcial Pless: Treating Izsak Meir/Extender: [name], [name] Weeks in Treatment: 10 Active Problems Location of Pain Severity and Description of Pain Patient Has Paino No Site Locations Rate the pain. Current Pain Level: 0 Pain Management and Medication Current Pain Management: Electronic Signature(s) Signed: 12/30/2022 4:18:00 PM By: [name] Entered By: [name] on 12/30/2022 13:57:04 Habig, Reita Chard ([id number]) [id number] Page 6 of 9 -------------------------------------------------------------------------------- Patient/Caregiver Education Details Patient Name: Date of Service: [name] [facility] RD, [name]. 6/5/2024andnbsp2:00 PM Medical Record Number: [id number] Patient Account Number: [id number] Date of Birth/Gender: Treating RN: 04/15/1951 (72 y.o. [name] Primary Care Physician: [name] [name] Other Clinician: Referring Physician: Treating Physician/Extender: [name], [name] Weeks in Treatment: 10 Education Assessment Education Provided To: Patient Education Topics Provided Pressure: Methods: Explain/Verbal Responses: Reinforcements needed, State content correctly Wound/Skin Impairment: Methods: Explain/Verbal Responses: Reinforcements needed, State content correctly Electronic Signature(s) Signed: 12/30/2022 4:18:00 PM By: [name] Entered By: [name] on 12/30/2022 14:20:46 -------------------------------------------------------------------------------- Wound Assessment Details Patient Name: Date of Service: [name] 12/30/2022 2:00 PM Medical Record Number: [id number] Patient Account Number: [id number] Date of Birth/Sex: Treating RN: Oct 28, 1950 (72 y.o. F) Primary Care Jo-Ann Johanning: [name] [name] Other Clinician: Referring Jourdon Zimmerle: Treating Jeremias Broyhill/Extender: [name], [name] Weeks in Treatment:  10 Wound Status Wound Number: 6 Primary Pressure Ulcer Etiology: Wound Location: Left, Plantar Foot Wound Open Wounding Event: Blister Status: Date Acquired: 04/26/2022 Comorbid Anemia, Chronic Obstructive Pulmonary Disease (COPD), Weeks Of Treatment: 10 History: Congestive Heart Failure, Coronary Artery Disease, Hypertension, Clustered Wound: No Hepatitis C, Rheumatoid Arthritis, Paraplegia, Received  Chemotherapy Photos SHELVY, [name] ([id number]) [id number] Page 7 of 9 Wound Measurements Length: (cm) 7.2 Width: (cm) 3.5 Depth: (cm) 0.1 Area: (cm) 19.792 Volume: (cm) 1.979 % Reduction in Area: 49% % Reduction in Volume: 83% Epithelialization: Small (1-33%) Tunneling: No Undermining: No Wound Description Classification: Category/Stage II Wound Margin: Distinct, outline attached Exudate Amount: Medium Exudate Type: Serosanguineous Exudate Color: red, brown Foul Odor After Cleansing: No Slough/Fibrino Yes Wound Bed Granulation Amount: Large (67-100%) Exposed Structure Granulation Quality: Red, Hyper-granulation, Friable Fascia Exposed: No Necrotic Amount: None Present (0%) Fat Layer (Subcutaneous Tissue) Exposed: Yes Tendon Exposed: No Muscle Exposed: No Joint Exposed: No Bone Exposed: No Periwound Skin Texture Texture Color No Abnormalities Noted: Yes No Abnormalities Noted: Yes Moisture Temperature / Pain No Abnormalities Noted: Yes Temperature: No Abnormality Electronic Signature(s) Signed: 12/30/2022 4:18:00 PM By: [name] Entered By: [name] on 12/30/2022 14:17:54 -------------------------------------------------------------------------------- Wound Assessment Details Patient Name: Date of Service: [name] 12/30/2022 2:00 PM Medical Record Number: [id number] Patient Account Number: [id number] Date of Birth/Sex: Treating RN: Feb 05, 1951 (72 y.o. F) Primary Care Salimata Christenson: [name] [name] Other  Clinician: Referring Miyah Hampshire: Treating Fardeen Steinberger/Extender: [name], [name] Weeks in Treatment: 10 Wound Status Wound Number: 8 Primary Pressure Ulcer Etiology: Wound Location: Left, Posterior Calcaneus Wound Open Wounding Event: Pressure Injury Status: Date Acquired: 12/03/2022 Comorbid Anemia, Chronic Obstructive Pulmonary Disease (COPD), Weeks Of Treatment: 0 History: Congestive Heart Failure, Coronary Artery Disease, Hypertension, Clustered Wound: No Hepatitis C, Rheumatoid Arthritis, Paraplegia, Received Chemotherapy Photos [name], [name] ([id number]) [id number] Page 8 of 9 Wound Measurements Length: (cm) 0.6 Width: (cm) 1 Depth: (cm) 0.3 Area: (cm) 0.471 Volume: (cm) 0.141 % Reduction in Area: % Reduction in Volume: Epithelialization: Small (1-33%) Tunneling: No Undermining: No Wound Description Classification: Category/Stage III Wound Margin: Distinct, outline attached Exudate Amount: Medium Exudate Type: Serosanguineous Exudate Color: red, brown Foul Odor After Cleansing: No Slough/Fibrino Yes Wound Bed Granulation Amount: Large (67-100%) Exposed Structure Granulation Quality: Red, Friable Fascia Exposed: No Necrotic Amount: Small (1-33%) Fat Layer (Subcutaneous Tissue) Exposed: Yes Necrotic Quality: Adherent Slough Tendon Exposed: No Muscle Exposed: No Joint Exposed: No Bone Exposed: No Periwound Skin Texture Texture Color No Abnormalities Noted: No No Abnormalities Noted: Yes Callus: Yes Temperature / Pain Temperature: No Abnormality Moisture No Abnormalities Noted: Yes Electronic Signature(s) Signed: 12/30/2022 4:18:00 PM By: [name] Entered By: [name] on 12/30/2022 14:18:38 -------------------------------------------------------------------------------- Wound Assessment Details Patient Name: Date of Service: [name] 12/30/2022 2:00 PM Medical Record Number:  [id number] Patient Account Number: [id number] Date of Birth/Sex: Treating RN: [birth year] (72 y.o. [name] Primary Care Jojo Pehl: [name] [name] Other Clinician: Referring Jarrod Bodkins: Treating Audwin Semper/Extender: [name], [name] Weeks in Treatment: 10 Wound Status Wound Number: 9 Primary Pressure Ulcer Etiology: Wound Location: Left Metatarsal head fifth Wound Open Wounding Event: Gradually Appeared Status: Date Acquired: 12/30/2022 Comorbid Anemia, Chronic Obstructive Pulmonary Disease (COPD), Weeks Of Treatment: 0 History: Congestive Heart Failure, Coronary Artery Disease, Hypertension, Clustered Wound: No Hepatitis C, Rheumatoid Arthritis, Paraplegia, Received Chemotherapy [name], [name] ([id number]) [id number] Page 9 of 9 Wound Measurements Length: (cm) 0.4 Width: (cm) 0.2 Depth: (cm) 0.1 Area: (cm) 0.063 Volume: (cm) 0.006 % Reduction in Area: % Reduction in Volume: Epithelialization: Medium (34-66%) Tunneling: No Undermining: No Wound Description Classification: Category/Stage II Wound Margin: Flat and Intact Exudate Amount: None Present Foul Odor After Cleansing: No Slough/Fibrino Yes Wound Bed Granulation Amount: Small (1-33%) Exposed Structure  Granulation Quality: Pink Fascia Exposed: No Necrotic Amount: Large (67-100%) Fat Layer (Subcutaneous Tissue) Exposed: No Necrotic Quality: Eschar Tendon Exposed: No Muscle Exposed: No Joint Exposed: No Bone Exposed: No Limited to Skin Breakdown Periwound Skin Texture Texture Color No Abnormalities Noted: Yes No Abnormalities Noted: Yes Moisture Temperature / Pain No Abnormalities Noted: Yes Temperature: No Abnormality Electronic Signature(s) Signed: 12/30/2022 4:18:00 PM By: [name] Entered By: [name] on 12/30/2022 14:37:45 -------------------------------------------------------------------------------- Vitals Details Patient Name:  Date of Service: [name] 12/30/2022 2:00 PM Medical Record Number: [id number] Patient Account Number: [id number] Date of Birth/Sex: Treating RN: 1951/04/18 (72 y.o. F) Primary Care Blanch Stang: [name] [name] Other Clinician: Referring Deshan Hemmelgarn: Treating Chelsee Hosie/Extender: [name], [name] Weeks in Treatment: 10 Vital Signs Time Taken: 01:52 Temperature (F): 98.0 Height (in): 65 Pulse (bpm): 83 Weight (lbs): 167 Respiratory Rate (breaths/min): 20 Body Mass Index (BMI): 27.8 Blood Pressure (mmHg): 98/64 Reference Range: 80 - 120 mg / dl Electronic Signature(s) Signed: 12/30/2022 4:18:00 PM By: [name] Entered By: [name] on 12/30/2022 13:56:56

## 2023-04-12 ENCOUNTER — Encounter: Payer: 59 | Attending: Physical Medicine and Rehabilitation | Admitting: Physical Medicine and Rehabilitation

## 2023-04-12 ENCOUNTER — Encounter: Payer: Self-pay | Admitting: Physical Medicine and Rehabilitation

## 2023-04-12 VITALS — BP 101/67 | HR 81 | Ht 65.0 in | Wt 167.0 lb

## 2023-04-12 DIAGNOSIS — M7918 Myalgia, other site: Secondary | ICD-10-CM | POA: Insufficient documentation

## 2023-04-12 DIAGNOSIS — L89154 Pressure ulcer of sacral region, stage 4: Secondary | ICD-10-CM | POA: Insufficient documentation

## 2023-04-12 DIAGNOSIS — Z993 Dependence on wheelchair: Secondary | ICD-10-CM | POA: Insufficient documentation

## 2023-04-12 DIAGNOSIS — M24569 Contracture, unspecified knee: Secondary | ICD-10-CM | POA: Insufficient documentation

## 2023-04-12 DIAGNOSIS — G8220 Paraplegia, unspecified: Secondary | ICD-10-CM | POA: Insufficient documentation

## 2023-04-12 MED ORDER — LIDOCAINE HCL 1 % IJ SOLN
6.0000 mL | Freq: Once | INTRAMUSCULAR | Status: AC
Start: 2023-04-12 — End: 2023-04-12
  Administered 2023-04-12: 6 mL
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
## 2023-04-22 ENCOUNTER — Telehealth: Payer: Self-pay | Admitting: Physical Medicine and Rehabilitation

## 2023-04-22 NOTE — Telephone Encounter (Signed)
Patient called and LVM 9/23 @4 :07 pm , called patient back today and she had a clinical question- Monday patient was having pain on top of her shoulder and Tramadol did not help with the patient and she put a Salonpas patch on and afterwards she felt dizzy and lightheaded . She wants to know if the combination of trigger points , medication and patch made her feel this way ?

## 2023-05-09 ENCOUNTER — Encounter (HOSPITAL_BASED_OUTPATIENT_CLINIC_OR_DEPARTMENT_OTHER): Payer: Self-pay

## 2023-05-09 ENCOUNTER — Emergency Department (HOSPITAL_BASED_OUTPATIENT_CLINIC_OR_DEPARTMENT_OTHER)
Admission: EM | Admit: 2023-05-09 | Discharge: 2023-05-10 | Disposition: A | Discharge status: Skilled Nursing Facility | Payer: 59 | Attending: Emergency Medicine | Admitting: Emergency Medicine

## 2023-05-09 DIAGNOSIS — Y732 Prosthetic and other implants, materials and accessory gastroenterology and urology devices associated with adverse incidents: Secondary | ICD-10-CM | POA: Insufficient documentation

## 2023-05-09 DIAGNOSIS — T83010A Breakdown (mechanical) of cystostomy catheter, initial encounter: Secondary | ICD-10-CM | POA: Diagnosis present

## 2023-05-09 DIAGNOSIS — C220 Liver cell carcinoma: Secondary | ICD-10-CM | POA: Diagnosis not present

## 2023-05-09 NOTE — Discharge Instructions (Signed)
As we discussed, we were unable to get a catheter into your suprapubic site today despite multiple attempts and trying a smaller catheter.  We were able to place a catheter in your urethra.  You will need to call your urologist tomorrow morning to schedule an appointment to have them evaluate and replace your catheter in the office.   Return if development of any new or worsening symptoms.

## 2023-05-09 NOTE — ED Notes (Signed)
-  Called Non-Emergency line for [facility] transportation at 1009pm. Patient is second on list.

## 2023-05-09 NOTE — ED Notes (Signed)
When attempting to obtain facility information from patient, patient states she thought she was getting admitted. Clarified to patient that admission was offered if we were unable to obtain catheter placement. Patient seems upset that she is not being admitted. Notified PA Smoot and DO Wallace Cullens, awaiting clarification.

## 2023-05-09 NOTE — ED Notes (Signed)
DO Wallace Cullens in to room to clarify plan of care with patient, patient angry that she has had to wait for so long for disposition, that it took so many tries to catheterize her, that we were unable to re-access her suprapubic catheter, that we do not have urology at this facility, that she is not being admitted. DO Wallace Cullens presented patient with the option to explain why we are not admitting her at this time, patient states "I don't care, I just want to go." Unit secretary notified patient is ready for transportation coordination.

## 2023-05-09 NOTE — ED Notes (Signed)
Patient resting quietly in stretcher, respirations even, unlabored, no acute distress noted. Denies needs at this time.

## 2023-05-09 NOTE — ED Triage Notes (Signed)
Staff at the nursing home at which she resides (she is paraplegic) attempted to replace her suprapubic #16 french foley catheter today. They were unable to insert the catheter once the existing suprapubic was d/c'd. Pt. Is awake, alert and comfortable.

## 2023-05-09 NOTE — ED Provider Notes (Signed)
[place] EMERGENCY DEPARTMENT AT [facility] Provider Note   CSN: [id number] Arrival date & time: 05/09/23  1712     History  Chief Complaint  Patient presents with   Suprapubic catheter problem    [name] is a 72 y.o. female.  Patient with history of paraplegia since 1977 with suprapubic catheter placement in January 2023 presents today with complaints of dislodged suprapubic catheter.  She states that she lives in a nursing home and staff was replacing her catheter today and were unable to place a new catheter once the old one was removed approximately 1 hours prior to arrival today. She notes that she does not have sensation in this area and therefore does not have any pain.  She uses a 16 [place] catheter.  Denies any issues with this since it was placed.  No other complaints at this time.  The history is provided by the patient. No language interpreter was used.       Home Medications Prior to Admission medications   Medication Sig Start Date End Date Taking? Authorizing Provider  acetaminophen (TYLENOL) 325 MG tablet Take 650 mg by mouth every 6 (six) hours as needed.    [provider]  albuterol (VENTOLIN HFA) 108 (90 Base) MCG/ACT inhaler Inhale 2 puffs into the lungs every 4 (four) hours as needed for wheezing or shortness of breath.    [provider]  alum & mag hydroxide-simeth (MAALOX PLUS) 400-400-40 MG/5ML suspension Take 10 mLs by mouth every 6 (six) hours as needed for indigestion, heartburn or flatulence (nausea).    [provider]  Amino Acids-Protein Hydrolys (FEEDING SUPPLEMENT, PRO-STAT 64,) LIQD Take 30 mLs by mouth 2 (two) times daily. For wound healing    [provider]  aspirin EC 81 MG tablet Take 81 mg by mouth daily. Swallow whole.    [provider]  atorvastatin (LIPITOR) 40 MG tablet Take 1 tablet (40 mg total) by mouth daily. 07/28/21 03/07/24  Andrey Campanile, MD  baclofen  (LIORESAL) 10 MG tablet Take 1 tablet (10 mg total) by mouth 3 (three) times daily. 09/16/21   Lynn Ito, MD  Benzocaine 10 MG LOZG Use as directed 1 lozenge in the mouth or throat every 2 (two) hours as needed (sore throat).    [provider]  bisacodyl (DULCOLAX) 10 MG suppository Place 1 suppository (10 mg total) rectally every Tuesday, Thursday, Saturday, and Sunday at 6 PM. 02/15/18   Mariea Clonts, Courage, MD  Brimonidine Tartrate (LUMIFY) 0.025 % SOLN Place 1 drop into both eyes daily as needed (redness).    [provider]  calcium-vitamin D (OSCAL WITH D) 500-200 MG-UNIT tablet Take 1 tablet by mouth 2 (two) times daily.    [provider]  Carboxymethylcellulose Sod PF 1 % GEL Place 1 drop into both eyes in the morning, at noon, in the evening, and at bedtime.    [provider]  carvedilol (COREG) 12.5 MG tablet Take 1 tablet (12.5 mg total) by mouth 2 (two) times daily with a meal. Hold for SBP <100 09/23/21   Rollene Rotunda, MD  cetirizine (ZYRTEC) 10 MG tablet Take 10 mg by mouth daily as needed for allergies.     [provider]  chlorhexidine (PERIDEX) 0.12 % solution Use as directed in the mouth or throat 2 (two) times daily. 01/17/23   [provider]  cholecalciferol (VITAMIN D) 1000 units tablet Take 1,000 Units by mouth daily.    [provider]  Cranberry 450 MG TABS Take 450 mg by mouth daily.    [provider]  diclofenac Sodium (VOLTAREN) 1 % GEL Apply 2 g topically 3 (three) times daily. Apply to bilateral shoulders 07/30/21   [provider]  fenofibrate (TRICOR) 48 MG tablet Take 48 mg by mouth daily. 06/15/22   [provider]  furosemide (LASIX) 40 MG tablet Take 40 mg by mouth 2 (two) times daily. 10/13/21   [provider]  guaifenesin (ROBITUSSIN) 100 MG/5ML syrup Take 200 mg by mouth 4 (four) times daily as needed for cough.    [provider]  hydrocortisone 2.5 %  lotion Apply 1 application. topically every 12 (twelve) hours as needed (itching of arms and legs).    [provider]  hydroquinone 2 % cream Apply 1 application. topically 2 (two) times daily. To dark spots    [provider]  hydroxychloroquine (PLAQUENIL) 200 MG tablet Take 200 mg by mouth daily.     [provider]  ipratropium-albuterol (DUONEB) 0.5-2.5 (3) MG/3ML SOLN Inhale 3 mLs into the lungs every 4 (four) hours as needed. 08/06/17   [provider]  lactulose (CHRONULAC) 10 GM/15ML solution Take 10 g by mouth daily as needed. 04/07/22   [provider]  lactulose, encephalopathy, (CHRONULAC) 10 GM/15ML SOLN Take 10 g by mouth daily.    [provider]  lidocaine (XYLOCAINE) 5 % ointment Apply topically. 03/02/22   [provider]  LINZESS 290 MCG CAPS capsule Take 290 mcg by mouth daily. 07/18/21   [provider]  losartan (COZAAR) 25 MG tablet Take 1 tablet (25 mg total) by mouth daily. 09/23/21 03/06/24  Rollene Rotunda, MD  melatonin 5 MG TABS Take 5 mg by mouth at bedtime.    [provider]  Multiple Vitamin (MULTIVITAMIN WITH MINERALS) TABS tablet Take 1 tablet by mouth daily.    [provider]  omeprazole (PRILOSEC) 40 MG capsule Take 40 mg by mouth daily. 06/11/22   [provider]  ondansetron (ZOFRAN) 4 MG tablet Take 4 mg by mouth every 8 (eight) hours as needed for nausea or vomiting.    [provider]  oxybutynin (DITROPAN-XL) 10 MG 24 hr tablet Take 10 mg by mouth daily. 04/15/22   [provider]  oxymetazoline (AFRIN) 0.05 % nasal spray Place 2 sprays into both nostrils 2 (two) times daily.    [provider]  polyethylene glycol (MIRALAX / GLYCOLAX) 17 g packet Take 17 g by mouth daily. Increase to twice a day as needed 12/26/21   Armbruster, Willaim Rayas, MD  Prenatal Vit-DSS-Fe Fum-FA (PRENATAL 19) 29-1 MG TABS Take 1 tablet by mouth daily at 6 (six) AM.     [provider]  Propylene Glycol (SYSTANE COMPLETE OP) Place 2 drops into both eyes in the morning, at noon, in the evening, and at bedtime.    [provider]  Simethicone 125 MG TABS Take 125 mg by mouth every 8 (eight) hours as needed (gas pain).    [provider]  sodium chloride (OCEAN) 0.65 % SOLN nasal spray Place 1 spray into both nostrils as needed for congestion.    [provider]  sucralfate (CARAFATE) 1 GM/10ML suspension Take 1 g by mouth every 6 (six) hours as needed (gerd).    [provider]  tizanidine (ZANAFLEX) 6 MG capsule Take 6 mg by mouth 3 (three) times daily. 03/03/22   [provider]  traMADol Janean Sark) 50  MG tablet Take 50 mg by mouth every 6 (six) hours as needed (muscle spasms).    [provider]  triamcinolone cream (KENALOG) 0.1 % Apply 1 application. topically 2 (two) times daily. 08/06/21   [provider]  zinc sulfate 220 (50 Zn) MG capsule Take 220 mg by mouth daily.    [provider]      Allergies    Patient has no known allergies.    Review of Systems   Review of Systems  All other systems reviewed and are negative.   Physical Exam Updated Vital Signs BP 118/66   Pulse 75   Temp 98.3 F (36.8 C) (Oral)   Resp 20   SpO2 97%  Physical Exam Vitals and nursing note reviewed.  Constitutional:      General: She is not in acute distress.    Appearance: Normal appearance. She is normal weight. She is not ill-appearing, toxic-appearing or diaphoretic.  HENT:     Head: Normocephalic and atraumatic.  Cardiovascular:     Rate and Rhythm: Normal rate.  Pulmonary:     Effort: Pulmonary effort is normal. No respiratory distress.  Abdominal:     Comments: Suprapubic catheter site open and noninfectious appearing.  No catheter currently in place.  Musculoskeletal:        General: Normal range of motion.     Cervical back: Normal range of motion.  Skin:    General: Skin  is warm and dry.  Neurological:     General: No focal deficit present.     Mental Status: She is alert.  Psychiatric:        Mood and Affect: Mood normal.        Behavior: Behavior normal.     ED Results / Procedures / Treatments   Labs (all labs ordered are listed, but only abnormal results are displayed) Labs Reviewed - No data to display  EKG None  Radiology No results found.  Procedures SUPRAPUBIC TUBE PLACEMENT  Date/Time: 05/09/2023 8:46 PM  Performed by: Silva Bandy, PA-C Authorized by: Silva Bandy, PA-C   Consent:    Consent obtained:  Verbal   Consent given by:  Patient   Risks, benefits, and alternatives were discussed: yes     Risks discussed:  Bleeding, bowel perforation, infection and pain   Alternatives discussed:  No treatment, delayed treatment, alternative treatment, observation and referral Universal protocol:    Procedure explained and questions answered to patient or proxy's satisfaction: yes     Required blood products, implants, devices, and special equipment available: yes     Patient identity confirmed:  Verbally with patient Sedation:    Sedation type:  None Anesthesia:    Anesthesia method:  None Procedure details:    Complexity:  Complex   Catheter type:  Foley   Catheter size:  14 Fr   Ultrasound guidance: no     Number of attempts:  5 or more Post-procedure details:    Procedure completion:  Tolerated Comments:     Unable to successfully pass catheter despite multiple attempts     Medications Ordered in ED Medications - No data to display  ED Course/ Medical Decision Making/ A&P                                 Medical Decision Making  Patient presents today with complaints of suprapubic catheter problem.  She is afebrile, nontoxic-appearing, and  in no acute distress with reassuring vital signs.  Physical exam reveals open suprapubic catheter site without a catheter in place.  Numerous attempts made to replace the  catheter with both a 16 [place] and 14 French catheter by nursing staff, my attending Dr. Wallace Cullens, and myself per above procedure.  Unable to successfully pass the catheter via the suprapubic site and therefore a Foley catheter was placed in her urethra instead.  We did offer transfer to be seen by urology and have this managed inpatient, however patient would rather schedule a follow-up appointment through her urologist outpatient.  She has not seen them in some time and therefore requested a referral.  She does note that she has been seen by [facility] previously.  Referral sent for same.  Urine noninfectious appearing in the bag and patient has no signs or symptoms to suggest infection, therefore no UA was sent. Evaluation and diagnostic testing in the emergency department does not suggest an emergent condition requiring admission or immediate intervention beyond what has been performed at this time.  Plan for discharge with close PCP follow-up.  Patient is understanding and amenable with plan, educated on red flag symptoms that would prompt immediate return.  Patient discharged in stable condition.  Final Clinical Impression(s) / ED Diagnoses Final diagnoses:  Suprapubic catheter dysfunction, initial encounter [facility])    Rx / DC Orders ED Discharge Orders     None     An After Visit Summary was printed and given to the patient.     Vear Clock 05/09/23 2053    Edwin Dada P, DO 05/11/23 1332

## 2023-05-10 ENCOUNTER — Ambulatory Visit (HOSPITAL_BASED_OUTPATIENT_CLINIC_OR_DEPARTMENT_OTHER)
Admission: RE | Admit: 2023-05-10 | Discharge: 2023-05-10 | Disposition: A | Discharge status: Home / Self Care | Payer: 59 | Source: Ambulatory Visit | Attending: Oncology | Admitting: Oncology

## 2023-05-10 ENCOUNTER — Inpatient Hospital Stay: Payer: 59 | Attending: Oncology

## 2023-05-10 DIAGNOSIS — T83010A Breakdown (mechanical) of cystostomy catheter, initial encounter: Secondary | ICD-10-CM | POA: Diagnosis not present

## 2023-05-10 DIAGNOSIS — C220 Liver cell carcinoma: Secondary | ICD-10-CM | POA: Insufficient documentation

## 2023-05-10 LAB — BASIC METABOLIC PANEL - CANCER CENTER ONLY
Anion gap: 8 (ref 5–15)
BUN: 21 mg/dL (ref 8–23)
CO2: 30 mmol/L (ref 22–32)
Calcium: 10.3 mg/dL (ref 8.9–10.3)
Chloride: 101 mmol/L (ref 98–111)
Creatinine: 0.7 mg/dL (ref 0.44–1.00)
GFR, Estimated: >60 mL/min (ref >60)
Glucose, Bld: 134 mg/dL — ABNORMAL HIGH (ref 70–99)
Potassium: 4.3 mmol/L (ref 3.5–5.1)
Sodium: 139 mmol/L (ref 135–145)

## 2023-05-10 MED ORDER — IOHEXOL 300 MG/ML  SOLN
100.0000 mL | Freq: Once | INTRAMUSCULAR | Status: AC | PRN
  Administered 2023-05-10: 100 mL via INTRAVENOUS

## 2023-05-10 NOTE — ED Notes (Signed)
[facility] here for patient transport, informed EMT that we have attempted 5+ times to call report with no answer from facility, encouraged EMT to inform staff at facility to call back for report. Discussed AVS with patient.

## 2023-05-10 NOTE — Progress Notes (Signed)
Unsuccessful attempt to pass 53F and 79F suprapubic Foley catheter. Patient recommend for transfer for catheter insertion by urology and decline stating "that's what I came here instead of a real hospital". Explained to patient that we are fully functioning emergency room but do not have specialists readily available nor do we have admission beds. Willing to transfer to [facility] or [facility]. Patient declining transfer.   Multiple attempts at urethral catheter insertion by nursing staff. Difficulty secondary to excess labial skin, swollen labia, body habitus, and significant lower extremity contractions with inability to abduct or external rotate patient's hips. I stayed in the room and attempted to assist nursing staff. Patient visibly frustrated at perplexed stating "how can you not find my urethra".  Barriers to urethral visualized explained. Patient again recommend for admission.   At this time, patient stated "just let me do it myself". She then requested that we lift her bottom up into the air. Two additional female nurses called to the bedside to assist. Foley catheter insertion successful. Patient recommend for very close follow up with her established urologist first thing tomorrow morning. She is aware that the longer the suprapubic Catheter is out, the more difficult it will be for urology to recover the passageway without dilation techniques. Care resumed by PA at this time.  Called to the bedside. Patient agitated and yelling. She states she doesn't understand why she isn't being admitted. Patient reminded that she was recommended for admission the entire time on serval occasions by both myself and her PA. Patient again offered admission and again she is declining stating "No. Just get me out of here".

## 2023-05-11 LAB — AFP TUMOR MARKER: AFP, Serum, Tumor Marker: 10.5 ng/mL — ABNORMAL HIGH (ref 0.0–9.2)

## 2023-05-14 ENCOUNTER — Telehealth: Payer: Self-pay

## 2023-05-14 NOTE — Telephone Encounter (Signed)
-----   Message from Thornton Papas sent at 05/14/2023 12:57 PM EDT ----- Please call patient, the AFP is mildly elevated and stable, the CTs showed no evidence of progressive cancer, follow-up as scheduled

## 2023-05-14 NOTE — Telephone Encounter (Signed)
Patient gave verbal understanding and had no further questions or concerns  

## 2023-05-17 ENCOUNTER — Telehealth: Payer: Self-pay | Admitting: Physical Medicine and Rehabilitation

## 2023-05-17 NOTE — Telephone Encounter (Signed)
[name] send me a fax that she is scheduled for a MRI under general anesthesia on 12/5. They sent H&P paperwork that needs to be filled out and faxed back to them within 30 days of the MRI. Paperwork given to the pod.

## 2023-05-17 NOTE — Telephone Encounter (Signed)
Patient is wanting to speak to Dr. Berline Chough about issues she is having and a referral to urology. She feels her current Urology office is not giving her the care she needs. She is currently going to [facility]. They can't see her until January.

## 2023-05-17 NOTE — Telephone Encounter (Signed)
Completed form and placed in MD office for review and signature

## 2023-05-18 NOTE — Telephone Encounter (Signed)
Tried to call patient, unable to leave VM

## 2023-05-18 NOTE — Telephone Encounter (Signed)
[name] is having issues with her Suprapubic catheter. Its currently not working and [facility] has placed a Foley.   She would like it if you would place a new Urology referral. To somewhere other than [facility].   Call back phone [phone].

## 2023-05-19 NOTE — Telephone Encounter (Signed)
Pt had SPC pulled out accidentally at nursing home.   Went to [facility] today- about it- frustrated about goal of [facility]- and not knowing where to place [facility]-   ED couldn't put back in because was too late after 2 hours.   Will wait on another Urology referral per pt.

## 2023-05-20 ENCOUNTER — Other Ambulatory Visit (HOSPITAL_COMMUNITY): Payer: Self-pay | Admitting: Urology

## 2023-05-20 DIAGNOSIS — N319 Neuromuscular dysfunction of bladder, unspecified: Secondary | ICD-10-CM

## 2023-05-20 DIAGNOSIS — R338 Other retention of urine: Secondary | ICD-10-CM

## 2023-05-21 ENCOUNTER — Encounter (HOSPITAL_COMMUNITY): Payer: Self-pay

## 2023-05-21 ENCOUNTER — Emergency Department (HOSPITAL_COMMUNITY)
Admission: EM | Admit: 2023-05-21 | Discharge: 2023-05-22 | Disposition: A | Discharge status: Skilled Nursing Facility | Payer: 59 | Attending: Emergency Medicine | Admitting: Emergency Medicine

## 2023-05-21 DIAGNOSIS — T839XXS Unspecified complication of genitourinary prosthetic device, implant and graft, sequela: Secondary | ICD-10-CM

## 2023-05-21 DIAGNOSIS — Z466 Encounter for fitting and adjustment of urinary device: Secondary | ICD-10-CM | POA: Diagnosis present

## 2023-05-21 DIAGNOSIS — Z7982 Long term (current) use of aspirin: Secondary | ICD-10-CM | POA: Diagnosis not present

## 2023-05-21 NOTE — ED Provider Notes (Signed)
[place] EMERGENCY DEPARTMENT AT [facility] Provider Note   CSN: [id number] Arrival date & time: 05/21/23  2054     History  No chief complaint on file.   [name] is a 72 y.o. female.  This is a 72 year old female who is here today because her Foley catheter keeps falling out.  Patient had previously required a suprapubic catheter, however it was removed at her skilled nursing facility, and then the Tylenol closed.  The reason why the patient got a suprapubic catheter was because of the chronic issues of her Foley catheter is falling out.  She has already made an appointment with urology for a new super pubic catheter.  She is coming from a skilled nursing facility.        Home Medications Prior to Admission medications   Medication Sig Start Date End Date Taking? Authorizing Provider  acetaminophen (TYLENOL) 325 MG tablet Take 650 mg by mouth every 6 (six) hours as needed.    [provider]  albuterol (VENTOLIN HFA) 108 (90 Base) MCG/ACT inhaler Inhale 2 puffs into the lungs every 4 (four) hours as needed for wheezing or shortness of breath.    [provider]  alum & mag hydroxide-simeth (MAALOX PLUS) 400-400-40 MG/5ML suspension Take 10 mLs by mouth every 6 (six) hours as needed for indigestion, heartburn or flatulence (nausea).    [provider]  Amino Acids-Protein Hydrolys (FEEDING SUPPLEMENT, PRO-STAT 64,) LIQD Take 30 mLs by mouth 2 (two) times daily. For wound healing    [provider]  aspirin EC 81 MG tablet Take 81 mg by mouth daily. Swallow whole.    [provider]  atorvastatin (LIPITOR) 40 MG tablet Take 1 tablet (40 mg total) by mouth daily. 07/28/21 03/07/24  Andrey Campanile, MD  baclofen (LIORESAL) 10 MG tablet Take 1 tablet (10 mg total) by mouth 3 (three) times daily. 09/16/21   Lynn Ito, MD  Benzocaine 10 MG LOZG Use as directed 1 lozenge in the mouth or throat every 2 (two) hours as  needed (sore throat).    [provider]  bisacodyl (DULCOLAX) 10 MG suppository Place 1 suppository (10 mg total) rectally every Tuesday, Thursday, Saturday, and Sunday at 6 PM. 02/15/18   Mariea Clonts, Courage, MD  Brimonidine Tartrate (LUMIFY) 0.025 % SOLN Place 1 drop into both eyes daily as needed (redness).    [provider]  calcium-vitamin D (OSCAL WITH D) 500-200 MG-UNIT tablet Take 1 tablet by mouth 2 (two) times daily.    [provider]  Carboxymethylcellulose Sod PF 1 % GEL Place 1 drop into both eyes in the morning, at noon, in the evening, and at bedtime.    [provider]  carvedilol (COREG) 12.5 MG tablet Take 1 tablet (12.5 mg total) by mouth 2 (two) times daily with a meal. Hold for SBP <100 09/23/21   Rollene Rotunda, MD  cetirizine (ZYRTEC) 10 MG tablet Take 10 mg by mouth daily as needed for allergies.     [provider]  chlorhexidine (PERIDEX) 0.12 % solution Use as directed in the mouth or throat 2 (two) times daily. 01/17/23   [provider]  cholecalciferol (VITAMIN D) 1000 units tablet Take 1,000 Units by mouth daily.    [provider]  Cranberry 450 MG TABS Take 450 mg by mouth daily.    [provider]  diclofenac Sodium (VOLTAREN) 1 % GEL Apply 2 g topically 3 (three) times daily. Apply to  bilateral shoulders 07/30/21   [provider]  fenofibrate (TRICOR) 48 MG tablet Take 48 mg by mouth daily. 06/15/22   [provider]  furosemide (LASIX) 40 MG tablet Take 40 mg by mouth 2 (two) times daily. 10/13/21   [provider]  guaifenesin (ROBITUSSIN) 100 MG/5ML syrup Take 200 mg by mouth 4 (four) times daily as needed for cough.    [provider]  hydrocortisone 2.5 % lotion Apply 1 application. topically every 12 (twelve) hours as needed (itching of arms and legs).    [provider]  hydroquinone 2 % cream Apply 1 application. topically 2 (two) times daily. To  dark spots    [provider]  hydroxychloroquine (PLAQUENIL) 200 MG tablet Take 200 mg by mouth daily.     [provider]  ipratropium-albuterol (DUONEB) 0.5-2.5 (3) MG/3ML SOLN Inhale 3 mLs into the lungs every 4 (four) hours as needed. 08/06/17   [provider]  lactulose (CHRONULAC) 10 GM/15ML solution Take 10 g by mouth daily as needed. 04/07/22   [provider]  lactulose, encephalopathy, (CHRONULAC) 10 GM/15ML SOLN Take 10 g by mouth daily.    [provider]  lidocaine (XYLOCAINE) 5 % ointment Apply topically. 03/02/22   [provider]  LINZESS 290 MCG CAPS capsule Take 290 mcg by mouth daily. 07/18/21   [provider]  losartan (COZAAR) 25 MG tablet Take 1 tablet (25 mg total) by mouth daily. 09/23/21 03/06/24  Rollene Rotunda, MD  melatonin 5 MG TABS Take 5 mg by mouth at bedtime.    [provider]  Multiple Vitamin (MULTIVITAMIN WITH MINERALS) TABS tablet Take 1 tablet by mouth daily.    [provider]  omeprazole (PRILOSEC) 40 MG capsule Take 40 mg by mouth daily. 06/11/22   [provider]  ondansetron (ZOFRAN) 4 MG tablet Take 4 mg by mouth every 8 (eight) hours as needed for nausea or vomiting.    [provider]  oxybutynin (DITROPAN-XL) 10 MG 24 hr tablet Take 10 mg by mouth daily. 04/15/22   [provider]  oxymetazoline (AFRIN) 0.05 % nasal spray Place 2 sprays into both nostrils 2 (two) times daily.    [provider]  polyethylene glycol (MIRALAX / GLYCOLAX) 17 g packet Take 17 g by mouth daily. Increase to twice a day as needed 12/26/21   Armbruster, Willaim Rayas, MD  Prenatal Vit-DSS-Fe Fum-FA (PRENATAL 19) 29-1 MG TABS Take 1 tablet by mouth daily at 6 (six) AM.    [provider]  Propylene Glycol (SYSTANE COMPLETE OP) Place 2 drops into both eyes in the morning, at noon, in the evening, and at bedtime.    [provider]  Simethicone 125 MG TABS  Take 125 mg by mouth every 8 (eight) hours as needed (gas pain).    [provider]  sodium chloride (OCEAN) 0.65 % SOLN nasal spray Place 1 spray into both nostrils as needed for congestion.    [provider]  sucralfate (CARAFATE) 1 GM/10ML suspension Take 1 g by mouth every 6 (six) hours as needed (gerd).    [provider]  tizanidine (ZANAFLEX) 6 MG capsule Take 6 mg by mouth 3 (three) times daily. 03/03/22   [provider]  traMADol (ULTRAM) 50 MG tablet Take 50 mg by mouth every 6 (six) hours as needed (muscle spasms).    [provider]  triamcinolone cream (KENALOG) 0.1 % Apply 1 application. topically 2 (two) times daily.  08/06/21   [provider]  zinc sulfate 220 (50 Zn) MG capsule Take 220 mg by mouth daily.    [provider]      Allergies    Patient has no known allergies.    Review of Systems   Review of Systems  Physical Exam Updated Vital Signs BP 137/68   Pulse 80   Temp 98.2 F (36.8 C)   Resp 18   SpO2 100%  Physical Exam Vitals reviewed.  Constitutional:      General: She is not in acute distress.    Appearance: Normal appearance.  Genitourinary:    General: Normal vulva.  Musculoskeletal:     Cervical back: Normal range of motion.  Skin:    General: Skin is warm and dry.  Neurological:     Mental Status: She is alert.     ED Results / Procedures / Treatments   Labs (all labs ordered are listed, but only abnormal results are displayed) Labs Reviewed - No data to display  EKG None  Radiology No results found.  Procedures Procedures    Medications Ordered in ED Medications - No data to display  ED Course/ Medical Decision Making/ A&P                                 Medical Decision Making 72 year old female here today for Foley catheter insertion.  Plan-will place a Foley catheter with a larger bulb so that it will not fall out.  Patient will eventually receive  definitive management outpatient via urology with a suprapubic catheter.           Final Clinical Impression(s) / ED Diagnoses Final diagnoses:  None    Rx / DC Orders ED Discharge Orders     None         Arletha Pili, DO 05/21/23 2155

## 2023-05-21 NOTE — ED Triage Notes (Signed)
Pt had a suprapubic catheter recent but was replaced with a foley catheter, states that with foley caths they will come out of her sometimes and this is the issue at hand tonight. Pt has no other complaints at this time and no complaints of pain as well. Pt is coming from [facility].   Medic vitals   110/70 77hr 96%ra 18rr

## 2023-05-21 NOTE — ED Notes (Signed)
Foley cath with large lumen ordered from OR and waiting to be sent down

## 2023-05-21 NOTE — Discharge Instructions (Addendum)
While you were in the emergency department, you had a Foley catheter with a large bulb placed.  This bulb had 20 cc of saline in it.  Follow-up with urology for definitive management of your catheter, and I hope you are able to get a suprapubic catheter soon.

## 2023-05-22 NOTE — ED Notes (Addendum)
[facility] CALLED FOR TRANSPORT BACK TO FACILITY

## 2023-05-24 ENCOUNTER — Telehealth: Payer: Self-pay

## 2023-05-24 NOTE — Telephone Encounter (Signed)
We received a H&P Form for Out-Patient Adult Sedation Procedures. Dr. Terrace Arabia reviewed it and would like to schedule the patient for a virtual visit since she was last seen in May 2024. I called [facility] to schedule a virtual visit. I was transferred twice in an attempt to be connected with the unit manager. I have placed on an extended hold with no one connecting. Before I was transferred phone staff mentioned the scheduling coordinator has gone home for the day. Please attempt to schedule the patient within 30 days of 07/01/23.

## 2023-05-25 NOTE — Telephone Encounter (Signed)
Phone rep called [name], spoke with Drinda Butts who said she will have pt to call us to go about scheduling the needed virtual visit, I provided Drinda Butts with our office# and hours to give pt.

## 2023-05-31 ENCOUNTER — Other Ambulatory Visit: Payer: Self-pay | Admitting: Physician Assistant

## 2023-05-31 DIAGNOSIS — Z01818 Encounter for other preprocedural examination: Secondary | ICD-10-CM

## 2023-06-01 ENCOUNTER — Encounter (HOSPITAL_COMMUNITY): Payer: Self-pay

## 2023-06-01 ENCOUNTER — Other Ambulatory Visit: Payer: Self-pay | Admitting: Interventional Radiology

## 2023-06-01 ENCOUNTER — Ambulatory Visit (HOSPITAL_COMMUNITY): Payer: 59

## 2023-06-01 DIAGNOSIS — C220 Liver cell carcinoma: Secondary | ICD-10-CM

## 2023-06-02 ENCOUNTER — Telehealth: Payer: Self-pay

## 2023-06-02 NOTE — Telephone Encounter (Signed)
[name] complains of Right elbow pain that moves up the  Right  shoulder and around to the scapula. She denies any chest pain. Pre patient its an on going pain problem.  [name] has requested to come in sooner for injections. If possible.   [phone].

## 2023-06-07 NOTE — Telephone Encounter (Signed)
Patient has an appt 06/09/23

## 2023-06-07 NOTE — Telephone Encounter (Signed)
You can put on wait list- I don't know how to get her in earlier to be seen, unless you think there is an easy way to get in earlier?= thanks ML

## 2023-06-08 ENCOUNTER — Emergency Department (HOSPITAL_COMMUNITY)
Admission: EM | Admit: 2023-06-08 | Discharge: 2023-06-08 | Disposition: A | Discharge status: Skilled Nursing Facility | Payer: 59 | Attending: Emergency Medicine | Admitting: Emergency Medicine

## 2023-06-08 ENCOUNTER — Other Ambulatory Visit: Payer: Self-pay

## 2023-06-08 DIAGNOSIS — Z7982 Long term (current) use of aspirin: Secondary | ICD-10-CM | POA: Diagnosis not present

## 2023-06-08 DIAGNOSIS — R339 Retention of urine, unspecified: Secondary | ICD-10-CM | POA: Diagnosis present

## 2023-06-08 DIAGNOSIS — Z79899 Other long term (current) drug therapy: Secondary | ICD-10-CM | POA: Diagnosis not present

## 2023-06-08 MED ORDER — LIDOCAINE HCL URETHRAL/MUCOSAL 2 % EX GEL
1.0000 | Freq: Once | CUTANEOUS | Status: AC
  Administered 2023-06-08: 1 via URETHRAL
  Filled 2023-06-08: qty 11

## 2023-06-08 NOTE — ED Notes (Signed)
Contacted [facility] for D/C transport

## 2023-06-08 NOTE — ED Notes (Signed)
Catheter placed, pt refusing to keep catheter in place for D/C. Pt states she has had multiple indwelling caths and her body rejects them. Pt presented to ED with significant swelling to right labia and urethral area. She says the facility refused to try to in and out cath her earlier today due to the swelling. Pt has agreed to keep cath in place until [facility] arrives to take her back to [facility].

## 2023-06-08 NOTE — ED Triage Notes (Signed)
Pt BIBA from [facility]. C/o labial swelling, and foley catheter accidentally came out last night. Facility was not able to replace catheter d/t swelling  Pt is paraplegic.

## 2023-06-08 NOTE — ED Notes (Signed)
Have made multiple attempts to contact [facility] to give D/C report. Unable to get any staff member on the phone

## 2023-06-08 NOTE — Discharge Instructions (Signed)
Please follow-up with your urologist in the office.  I see no reason why he cannot get ongoing in and out caths if that is what you would prefer.

## 2023-06-08 NOTE — ED Provider Notes (Signed)
EMERGENCY DEPARTMENT AT [facility] Provider Note   CSN: [id number] Arrival date & time: 06/08/23  1704     History  Chief Complaint  Patient presents with   foley catheter replacement   Groin Swelling    [name] is a 72 y.o. female.  72 yo F with a chief complaints of urinary retention.  She tells me that she has a history of paraplegia and has had to use a Foley catheter for about 40 years.  She had a suprapubic catheter that was inadvertently pulled out at her nursing facility about 2 months ago and unfortunately the hole closed and is no longer functional.  Since then she has had a Foley catheter.  There is been some struggle to keep the Foley in place and she is it is large is a 30 cc balloon come out spontaneously.  Therefore they switched to doing in and out caths at her facility and it sounds like there is been some resistance from the nursing facility.  She was told that she was too swollen to have it performed today and she was encouraged to come to the ED for evaluation.        Home Medications Prior to Admission medications   Medication Sig Start Date End Date Taking? Authorizing Provider  acetaminophen (TYLENOL) 325 MG tablet Take 650 mg by mouth every 6 (six) hours as needed.    [provider]  albuterol (VENTOLIN HFA) 108 (90 Base) MCG/ACT inhaler Inhale 2 puffs into the lungs every 4 (four) hours as needed for wheezing or shortness of breath.    [provider]  alum & mag hydroxide-simeth (MAALOX PLUS) 400-400-40 MG/5ML suspension Take 10 mLs by mouth every 6 (six) hours as needed for indigestion, heartburn or flatulence (nausea).    [provider]  Amino Acids-Protein Hydrolys (FEEDING SUPPLEMENT, PRO-STAT 64,) LIQD Take 30 mLs by mouth 2 (two) times daily. For wound healing    [provider]  aspirin EC 81 MG tablet Take 81 mg by mouth daily. Swallow whole.    [provider]   atorvastatin (LIPITOR) 40 MG tablet Take 1 tablet (40 mg total) by mouth daily. 07/28/21 03/07/24  Andrey Campanile, MD  baclofen (LIORESAL) 10 MG tablet Take 1 tablet (10 mg total) by mouth 3 (three) times daily. 09/16/21   Lynn Ito, MD  Benzocaine 10 MG LOZG Use as directed 1 lozenge in the mouth or throat every 2 (two) hours as needed (sore throat).    [provider]  bisacodyl (DULCOLAX) 10 MG suppository Place 1 suppository (10 mg total) rectally every Tuesday, Thursday, Saturday, and Sunday at 6 PM. 02/15/18   Mariea Clonts, Courage, MD  Brimonidine Tartrate (LUMIFY) 0.025 % SOLN Place 1 drop into both eyes daily as needed (redness).    [provider]  calcium-vitamin D (OSCAL WITH D) 500-200 MG-UNIT tablet Take 1 tablet by mouth 2 (two) times daily.    [provider]  Carboxymethylcellulose Sod PF 1 % GEL Place 1 drop into both eyes in the morning, at noon, in the evening, and at bedtime.    [provider]  carvedilol (COREG) 12.5 MG tablet Take 1 tablet (12.5 mg total) by mouth 2 (two) times daily with a meal. Hold for SBP <100 09/23/21   Rollene Rotunda, MD  cetirizine (ZYRTEC) 10 MG tablet Take 10 mg by mouth daily as needed for allergies.     [provider]  chlorhexidine (PERIDEX)  0.12 % solution Use as directed in the mouth or throat 2 (two) times daily. 01/17/23   [provider]  cholecalciferol (VITAMIN D) 1000 units tablet Take 1,000 Units by mouth daily.    [provider]  Cranberry 450 MG TABS Take 450 mg by mouth daily.    [provider]  diclofenac Sodium (VOLTAREN) 1 % GEL Apply 2 g topically 3 (three) times daily. Apply to bilateral shoulders 07/30/21   [provider]  fenofibrate (TRICOR) 48 MG tablet Take 48 mg by mouth daily. 06/15/22   [provider]  furosemide (LASIX) 40 MG tablet Take 40 mg by mouth 2 (two) times daily. 10/13/21   [provider]  guaifenesin (ROBITUSSIN)  100 MG/5ML syrup Take 200 mg by mouth 4 (four) times daily as needed for cough.    [provider]  hydrocortisone 2.5 % lotion Apply 1 application. topically every 12 (twelve) hours as needed (itching of arms and legs).    [provider]  hydroquinone 2 % cream Apply 1 application. topically 2 (two) times daily. To dark spots    [provider]  hydroxychloroquine (PLAQUENIL) 200 MG tablet Take 200 mg by mouth daily.     [provider]  ipratropium-albuterol (DUONEB) 0.5-2.5 (3) MG/3ML SOLN Inhale 3 mLs into the lungs every 4 (four) hours as needed. 08/06/17   [provider]  lactulose (CHRONULAC) 10 GM/15ML solution Take 10 g by mouth daily as needed. 04/07/22   [provider]  lactulose, encephalopathy, (CHRONULAC) 10 GM/15ML SOLN Take 10 g by mouth daily.    [provider]  lidocaine (XYLOCAINE) 5 % ointment Apply topically. 03/02/22   [provider]  LINZESS 290 MCG CAPS capsule Take 290 mcg by mouth daily. 07/18/21   [provider]  losartan (COZAAR) 25 MG tablet Take 1 tablet (25 mg total) by mouth daily. 09/23/21 03/06/24  Rollene Rotunda, MD  melatonin 5 MG TABS Take 5 mg by mouth at bedtime.    [provider]  Multiple Vitamin (MULTIVITAMIN WITH MINERALS) TABS tablet Take 1 tablet by mouth daily.    [provider]  omeprazole (PRILOSEC) 40 MG capsule Take 40 mg by mouth daily. 06/11/22   [provider]  ondansetron (ZOFRAN) 4 MG tablet Take 4 mg by mouth every 8 (eight) hours as needed for nausea or vomiting.    [provider]  oxybutynin (DITROPAN-XL) 10 MG 24 hr tablet Take 10 mg by mouth daily. 04/15/22   [provider]  oxymetazoline (AFRIN) 0.05 % nasal spray Place 2 sprays into both nostrils 2 (two) times daily.    [provider]  polyethylene glycol (MIRALAX / GLYCOLAX) 17 g packet Take 17 g by mouth daily. Increase to twice a day as needed  12/26/21   Armbruster, Willaim Rayas, MD  Prenatal Vit-DSS-Fe Fum-FA (PRENATAL 19) 29-1 MG TABS Take 1 tablet by mouth daily at 6 (six) AM.    [provider]  Propylene Glycol (SYSTANE COMPLETE OP) Place 2 drops into both eyes in the morning, at noon, in the evening, and at bedtime.    [provider]  Simethicone 125 MG TABS Take 125 mg by mouth every 8 (eight) hours as needed (gas pain).    [provider]  sodium chloride (OCEAN) 0.65 % SOLN nasal spray Place 1 spray into both nostrils as needed for congestion.    [provider]  sucralfate (CARAFATE) 1 GM/10ML suspension Take 1 g by  mouth every 6 (six) hours as needed (gerd).    [provider]  tizanidine (ZANAFLEX) 6 MG capsule Take 6 mg by mouth 3 (three) times daily. 03/03/22   [provider]  traMADol (ULTRAM) 50 MG tablet Take 50 mg by mouth every 6 (six) hours as needed (muscle spasms).    [provider]  triamcinolone cream (KENALOG) 0.1 % Apply 1 application. topically 2 (two) times daily. 08/06/21   [provider]  zinc sulfate 220 (50 Zn) MG capsule Take 220 mg by mouth daily.    [provider]      Allergies    Patient has no known allergies.    Review of Systems   Review of Systems  Physical Exam Updated Vital Signs Temp 97.7 F (36.5 C) (Oral)   Ht 5\' 5"  (1.651 m)   Wt 75.3 kg   SpO2 98%   BMI 27.62 kg/m  Physical Exam Vitals and nursing note reviewed.  Constitutional:      General: She is not in acute distress.    Appearance: She is well-developed. She is not diaphoretic.  HENT:     Head: Normocephalic and atraumatic.  Eyes:     Pupils: Pupils are equal, round, and reactive to light.  Cardiovascular:     Rate and Rhythm: Normal rate and regular rhythm.     Heart sounds: No murmur heard.    No friction rub. No gallop.  Pulmonary:     Effort: Pulmonary effort is normal.     Breath sounds: No wheezing or rales.  Abdominal:      General: There is no distension.     Palpations: Abdomen is soft.     Tenderness: There is no abdominal tenderness.  Genitourinary:    Comments: Chronic appearing skin changes to the right side of the pelvic region involving the right labia.  There is no fluctuance or induration.  I am able to see the urethra without issue.  There is no obvious edema of the surrounding structures. Musculoskeletal:        General: No tenderness.     Cervical back: Normal range of motion and neck supple.  Skin:    General: Skin is warm and dry.  Neurological:     Mental Status: She is alert and oriented to person, place, and time.  Psychiatric:        Behavior: Behavior normal.     ED Results / Procedures / Treatments   Labs (all labs ordered are listed, but only abnormal results are displayed) Labs Reviewed - No data to display  EKG None  Radiology No results found.  Procedures Procedures    Medications Ordered in ED Medications  lidocaine (XYLOCAINE) 2 % jelly 1 Application (has no administration in time range)    ED Course/ Medical Decision Making/ A&P                                 Medical Decision Making  55 yoF with a chief complaints of her nursing facility not being able to perform in and out catheterization.  She was told that the area was too swollen to perform this.  I have no issue visualizing the anatomy externally.  I did offer to place a Foley catheter in which she is declining.  She would prefer to have serial In-N-Out caths performed.  I will discharge her back to her facility.  Encouraged her to follow-up  with her urologist in the office.  A bladder scan was performed here and she is not retaining any urine currently.  6:18 PM:  I have discussed the diagnosis/risks/treatment options with the patient.  Evaluation and diagnostic testing in the emergency department does not suggest an emergent condition requiring admission or immediate intervention beyond what has been  performed at this time.  They will follow up with Urology. We also discussed returning to the ED immediately if new or worsening sx occur. We discussed the sx which are most concerning (e.g., sudden worsening pain, fever, inability to tolerate by mouth) that necessitate immediate return. Medications administered to the patient during their visit and any new prescriptions provided to the patient are listed below.  Medications given during this visit Medications  lidocaine (XYLOCAINE) 2 % jelly 1 Application (has no administration in time range)     The patient appears reasonably screen and/or stabilized for discharge and I doubt any other medical condition or other [facility] requiring further screening, evaluation, or treatment in the ED at this time prior to discharge.          Final Clinical Impression(s) / ED Diagnoses Final diagnoses:  Urinary retention    Rx / DC Orders ED Discharge Orders     None         Melene Plan, DO 06/08/23 1819

## 2023-06-09 ENCOUNTER — Encounter: Payer: Self-pay | Admitting: Physical Medicine and Rehabilitation

## 2023-06-09 ENCOUNTER — Encounter: Payer: 59 | Attending: Physical Medicine and Rehabilitation | Admitting: Physical Medicine and Rehabilitation

## 2023-06-09 VITALS — BP 126/80 | HR 87 | Ht 65.0 in | Wt 167.0 lb

## 2023-06-09 DIAGNOSIS — G8220 Paraplegia, unspecified: Secondary | ICD-10-CM

## 2023-06-09 DIAGNOSIS — T83010D Breakdown (mechanical) of cystostomy catheter, subsequent encounter: Secondary | ICD-10-CM

## 2023-06-09 DIAGNOSIS — N319 Neuromuscular dysfunction of bladder, unspecified: Secondary | ICD-10-CM | POA: Diagnosis not present

## 2023-06-09 DIAGNOSIS — M7918 Myalgia, other site: Secondary | ICD-10-CM | POA: Diagnosis not present

## 2023-06-09 MED ORDER — LIDOCAINE HCL 1 % IJ SOLN
3.0000 mL | Freq: Once | INTRAMUSCULAR | Status: AC
  Administered 2023-06-09: 3 mL
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
## 2023-06-09 NOTE — Patient Instructions (Signed)
Plan: Discussed at length the need for [facility]- suprapubic before gets cornea transplant- - sent a message to Dr Hall Busing in [place] to see if he can see earlier.    2. Patient here for trigger point injections for  Consent done and on chart.  Cleaned areas with alcohol and injected using a 27 gauge 1.5 inch needle  Injected 3cc Using 1% Lidocaine with no EPI  Upper traps B/L  Levators- b/L Posterior scalenes Middle scalenes- B/L  Splenius Capitus Pectoralis Major Rhomboids Infraspinatus Teres Major/minor Thoracic paraspinals Lumbar paraspinals Other injections- R forearm and R triceps- twitch responses on both   Patient's level of pain prior was Current level of pain after injections is  There was no bleeding or complications.  Patient was advised to drink a lot of water on day after injections to flush system Will have increased soreness for 12-48 hours after injections.  Can use Lidocaine patches the day AFTER injections Can use theracane on day of injections in places didn't inject Can use heating pad 4-6 hours AFTER injections  3. Will try to see if Can get in with Dr Hall Busing- for [facility]- seeing [facility]- referred by nursing home- have reached out to him. Have not heard back.    4. Spoke with Ophtho- think she needs SPC BEFORE Optho- corneal transplant- they agreed with me.    5. F/U in 6 weeks- but she doesn't have appt- so will need wait list and then will do in 3 months- and get in earlier for Trp injections.

## 2023-06-21 ENCOUNTER — Ambulatory Visit
Admission: RE | Admit: 2023-06-21 | Discharge: 2023-06-21 | Disposition: A | Discharge status: Home / Self Care | Payer: 59 | Source: Ambulatory Visit | Attending: Interventional Radiology | Admitting: Interventional Radiology

## 2023-06-21 DIAGNOSIS — C220 Liver cell carcinoma: Secondary | ICD-10-CM

## 2023-06-21 HISTORY — PX: IR RADIOLOGIST EVAL & MGMT: IMG5224
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
## 2023-06-22 ENCOUNTER — Telehealth: Payer: Self-pay

## 2023-06-22 NOTE — Telephone Encounter (Signed)
Right shoulder to upper arm is in severe pain, can't hardly move it. Please advice.

## 2023-06-23 ENCOUNTER — Ambulatory Visit: Payer: 59 | Admitting: Family Medicine

## 2023-06-23 ENCOUNTER — Encounter: Payer: 59 | Attending: Physical Medicine and Rehabilitation | Admitting: Physical Medicine and Rehabilitation

## 2023-06-23 NOTE — Telephone Encounter (Signed)
Attempt to call [name] back this morning. I am not able to leave a message. Her mailbox was full.

## 2023-06-28 ENCOUNTER — Telehealth: Payer: Self-pay | Admitting: Neurology

## 2023-06-28 NOTE — Telephone Encounter (Signed)
[facility]) cancelling appt on behalf of the patient. Did not say she wanted to reschedule.

## 2023-06-29 ENCOUNTER — Ambulatory Visit: Payer: 59 | Admitting: Family Medicine

## 2023-06-29 NOTE — Progress Notes (Signed)
Spoke with patient's nurse at [facility] nurse reports both appointments for 06-29-23 and 07-01-23 were to be cancelled per patient request

## 2023-07-01 ENCOUNTER — Ambulatory Visit (HOSPITAL_COMMUNITY): Admission: RE | Admit: 2023-07-01 | Payer: 59 | Source: Ambulatory Visit

## 2023-07-01 ENCOUNTER — Encounter (HOSPITAL_COMMUNITY): Payer: Self-pay

## 2023-07-01 ENCOUNTER — Encounter (HOSPITAL_COMMUNITY): Admission: RE | Payer: Self-pay | Source: Home / Self Care

## 2023-07-01 ENCOUNTER — Ambulatory Visit (HOSPITAL_COMMUNITY): Admission: RE | Admit: 2023-07-01 | Payer: 59 | Source: Home / Self Care

## 2023-07-01 SURGERY — MRI WITH ANESTHESIA
Anesthesia: General

## 2023-08-04 ENCOUNTER — Encounter: Payer: Self-pay | Admitting: Physical Medicine and Rehabilitation

## 2023-08-04 ENCOUNTER — Encounter: Payer: 59 | Attending: Physical Medicine and Rehabilitation | Admitting: Physical Medicine and Rehabilitation

## 2023-08-04 VITALS — BP 136/84 | HR 83 | Ht 65.0 in

## 2023-08-04 DIAGNOSIS — Z9359 Other cystostomy status: Secondary | ICD-10-CM | POA: Insufficient documentation

## 2023-08-04 DIAGNOSIS — Z993 Dependence on wheelchair: Secondary | ICD-10-CM | POA: Insufficient documentation

## 2023-08-04 DIAGNOSIS — N9089 Other specified noninflammatory disorders of vulva and perineum: Secondary | ICD-10-CM | POA: Insufficient documentation

## 2023-08-04 DIAGNOSIS — G8221 Paraplegia, complete: Secondary | ICD-10-CM | POA: Diagnosis present

## 2023-08-04 DIAGNOSIS — M7918 Myalgia, other site: Secondary | ICD-10-CM | POA: Insufficient documentation

## 2023-08-04 MED ORDER — LIDOCAINE HCL 1 % IJ SOLN
6.0000 mL | Freq: Once | INTRAMUSCULAR | Status: AC
  Administered 2023-08-04: 3 mL
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
## 2023-08-04 NOTE — Patient Instructions (Signed)
 Plan: Referral to wound Care at [facility] in [facility]- since closer to pt's nursing home. We discussed more- already there- suggest d/w pt about advocating for herself.  Wait on referral to Dr Brigitte-  for now- but if needs, will do.    2. Pain in deltoids that radiates down arms to wrists almost- it's Trp that are causing the pain- will do more TPR Injection than normal. Asking for something to numb where gets injections. Will try Cold spray   3. Patient here for trigger point injections for  Consent done and on chart.  Cleaned areas with alcohol  and injected using a 27 gauge 1.5 inch needle  Injected 3cc- none wasted Using 1% Lidocaine  with no EPI Used cold spray on each   Upper traps B/L- twitch responses Levators- B/L  Posterior scalenes Middle scalenes- B/L  Splenius Capitus Pectoralis Major Rhomboids Infraspinatus Teres Major/minor Thoracic paraspinals Lumbar paraspinals Other injections-  B/L deltoids  Patient's level of pain prior was 5/10 Current level of pain after injections is- little less  There was no bleeding or complications.  Patient was advised to drink a lot of water on day after injections to flush system Will have increased soreness for 12-48 hours after injections.  Can use Lidocaine  patches the day AFTER injections Can use theracane on day of injections in places didn't inject Can use heating pad 4-6 hours AFTER injections   4. Doesn't need refills- since at nursing facility.  Con't regimen of meds.   5. F/U in 6 weeks for double appt And to see q6 weeks alternating double and single appt.

## 2023-08-19 ENCOUNTER — Encounter (HOSPITAL_COMMUNITY): Payer: Self-pay | Admitting: Emergency Medicine

## 2023-08-19 ENCOUNTER — Emergency Department (HOSPITAL_COMMUNITY): Payer: 59

## 2023-08-19 ENCOUNTER — Other Ambulatory Visit: Payer: Self-pay

## 2023-08-19 ENCOUNTER — Emergency Department (HOSPITAL_COMMUNITY)
Admission: EM | Admit: 2023-08-19 | Discharge: 2023-08-19 | Disposition: A | Discharge status: Skilled Nursing Facility | Payer: 59 | Attending: Emergency Medicine | Admitting: Emergency Medicine

## 2023-08-19 DIAGNOSIS — G8220 Paraplegia, unspecified: Secondary | ICD-10-CM

## 2023-08-19 DIAGNOSIS — R7401 Elevation of levels of liver transaminase levels: Secondary | ICD-10-CM | POA: Diagnosis not present

## 2023-08-19 DIAGNOSIS — R319 Hematuria, unspecified: Secondary | ICD-10-CM | POA: Diagnosis not present

## 2023-08-19 DIAGNOSIS — Z8505 Personal history of malignant neoplasm of liver: Secondary | ICD-10-CM | POA: Diagnosis not present

## 2023-08-19 DIAGNOSIS — E871 Hypo-osmolality and hyponatremia: Secondary | ICD-10-CM

## 2023-08-19 DIAGNOSIS — Z79899 Other long term (current) drug therapy: Secondary | ICD-10-CM | POA: Insufficient documentation

## 2023-08-19 DIAGNOSIS — I110 Hypertensive heart disease with heart failure: Secondary | ICD-10-CM | POA: Insufficient documentation

## 2023-08-19 DIAGNOSIS — Z853 Personal history of malignant neoplasm of breast: Secondary | ICD-10-CM | POA: Diagnosis not present

## 2023-08-19 DIAGNOSIS — Z7982 Long term (current) use of aspirin: Secondary | ICD-10-CM | POA: Insufficient documentation

## 2023-08-19 DIAGNOSIS — R112 Nausea with vomiting, unspecified: Secondary | ICD-10-CM

## 2023-08-19 DIAGNOSIS — R109 Unspecified abdominal pain: Secondary | ICD-10-CM | POA: Diagnosis present

## 2023-08-19 DIAGNOSIS — N390 Urinary tract infection, site not specified: Secondary | ICD-10-CM | POA: Diagnosis not present

## 2023-08-19 DIAGNOSIS — I739 Peripheral vascular disease, unspecified: Secondary | ICD-10-CM

## 2023-08-19 DIAGNOSIS — I509 Heart failure, unspecified: Secondary | ICD-10-CM | POA: Diagnosis not present

## 2023-08-19 LAB — COMPREHENSIVE METABOLIC PANEL
ALT: 56 U/L — ABNORMAL HIGH (ref 0–44)
AST: 49 U/L — ABNORMAL HIGH (ref 15–41)
Albumin: 4 g/dL (ref 3.5–5.0)
Alkaline Phosphatase: 91 U/L (ref 38–126)
Anion gap: 12 (ref 5–15)
BUN: 19 mg/dL (ref 8–23)
CO2: 20 mmol/L — ABNORMAL LOW (ref 22–32)
Calcium: 9.8 mg/dL (ref 8.9–10.3)
Chloride: 97 mmol/L — ABNORMAL LOW (ref 98–111)
Creatinine, Ser: 0.8 mg/dL (ref 0.44–1.00)
GFR, Estimated: >60 mL/min (ref >60)
Glucose, Bld: 133 mg/dL — ABNORMAL HIGH (ref 70–99)
Potassium: 5.1 mmol/L (ref 3.5–5.1)
Sodium: 129 mmol/L — ABNORMAL LOW (ref 135–145)
Total Bilirubin: 1.3 mg/dL — ABNORMAL HIGH (ref 0.0–1.2)
Total Protein: 9 g/dL — ABNORMAL HIGH (ref 6.5–8.1)

## 2023-08-19 LAB — CBC WITH DIFFERENTIAL/PLATELET
Abs Immature Granulocytes: 0.02 10*3/uL (ref 0.00–0.07)
Basophils Absolute: 0 10*3/uL (ref 0.0–0.1)
Basophils Relative: 0 %
Eosinophils Absolute: 0 10*3/uL (ref 0.0–0.5)
Eosinophils Relative: 0 %
HCT: 47.6 % — ABNORMAL HIGH (ref 36.0–46.0)
Hemoglobin: 15.3 g/dL — ABNORMAL HIGH (ref 12.0–15.0)
Immature Granulocytes: 0 %
Lymphocytes Relative: 3 %
Lymphs Abs: 0.2 10*3/uL — ABNORMAL LOW (ref 0.7–4.0)
MCH: 26.1 pg (ref 26.0–34.0)
MCHC: 32.1 g/dL (ref 30.0–36.0)
MCV: 81.2 fL (ref 80.0–100.0)
Monocytes Absolute: 0.2 10*3/uL (ref 0.1–1.0)
Monocytes Relative: 3 %
Neutro Abs: 6.5 10*3/uL (ref 1.7–7.7)
Neutrophils Relative %: 94 %
Platelets: 248 10*3/uL (ref 150–400)
RBC: 5.86 MIL/uL — ABNORMAL HIGH (ref 3.87–5.11)
RDW: 16.5 % — ABNORMAL HIGH (ref 11.5–15.5)
WBC: 7 10*3/uL (ref 4.0–10.5)
nRBC: 0 % (ref 0.0–0.2)

## 2023-08-19 LAB — URINALYSIS, W/ REFLEX TO CULTURE (INFECTION SUSPECTED)
Bilirubin Urine: NEGATIVE
Glucose, UA: NEGATIVE mg/dL
Ketones, ur: NEGATIVE mg/dL
Nitrite: POSITIVE — AB
Protein, ur: 100 mg/dL — AB
Specific Gravity, Urine: 1.02 (ref 1.005–1.030)
WBC, UA: >50 WBC/hpf (ref 0–5)
pH: 5 (ref 5.0–8.0)

## 2023-08-19 LAB — LIPASE, BLOOD: Lipase: 25 U/L (ref 11–51)

## 2023-08-19 MED ORDER — ONDANSETRON 4 MG PO TBDP: 4.0000 mg | ORAL_TABLET | Freq: Three times a day (TID) | ORAL | 0 refills | Status: DC | PRN

## 2023-08-19 MED ORDER — IOHEXOL 350 MG/ML SOLN
75.0000 mL | Freq: Once | INTRAVENOUS | Status: AC | PRN
  Administered 2023-08-19: 75 mL via INTRAVENOUS

## 2023-08-19 MED ORDER — SODIUM CHLORIDE 0.9 % IV BOLUS
1000.0000 mL | Freq: Once | INTRAVENOUS | Status: AC
  Administered 2023-08-19: 1000 mL via INTRAVENOUS

## 2023-08-19 MED ORDER — SODIUM CHLORIDE 0.9 % IV SOLN
2.0000 g | Freq: Once | INTRAVENOUS | Status: AC
  Administered 2023-08-19: 2 g via INTRAVENOUS
  Filled 2023-08-19: qty 20

## 2023-08-19 MED ORDER — CEFDINIR 300 MG PO CAPS: 300.0000 mg | ORAL_CAPSULE | Freq: Two times a day (BID) | ORAL | 0 refills | Status: DC

## 2023-08-19 MED ORDER — ONDANSETRON HCL 4 MG/2ML IJ SOLN
4.0000 mg | Freq: Once | INTRAMUSCULAR | Status: AC
  Administered 2023-08-19: 4 mg via INTRAVENOUS
  Filled 2023-08-19: qty 2

## 2023-08-19 NOTE — ED Notes (Signed)
[facility] called pick up in the next 30 min.

## 2023-08-19 NOTE — ED Triage Notes (Addendum)
Patient [facility] from [facility] c/o throwing up brown liquid for 24 hours. Patient's abd has also become more distended since. Rales throughout. Was 89% on RA and came up with 2 L [place]. Hx: CHF  BP-138/80 HR-106

## 2023-08-19 NOTE — ED Provider Notes (Addendum)
[place] EMERGENCY DEPARTMENT AT [facility] Provider Note   CSN: [id number] Arrival date & time: 08/19/23  0153     History  Chief Complaint  Patient presents with   Abdominal Pain   Emesis    [name] is a 73 y.o. female.  The history is provided by the patient.  Abdominal Pain Associated symptoms: vomiting   Emesis Associated symptoms: abdominal pain   She has history of hypertension, heart failure, paraplegia, liver cancer, breast cancer, rheumatoid arthritis and comes in complaining of nausea and vomiting which started this afternoon.  Emesis was initially clear, but last episode was black.  She also states that she has not had a bowel movement for the last 4-5 days.  There has been some mild abdominal pain but not severe.  She denies any back pain.  She denies fever or chills.   Home Medications Prior to Admission medications   Medication Sig Start Date End Date Taking? Authorizing Provider  acetaminophen (TYLENOL) 325 MG tablet Take 650 mg by mouth every 6 (six) hours as needed.    [provider]  albuterol (VENTOLIN HFA) 108 (90 Base) MCG/ACT inhaler Inhale 2 puffs into the lungs every 4 (four) hours as needed for wheezing or shortness of breath.    [provider]  alum & mag hydroxide-simeth (MAALOX PLUS) 400-400-40 MG/5ML suspension Take 10 mLs by mouth every 6 (six) hours as needed for indigestion, heartburn or flatulence (nausea).    [provider]  Amino Acids-Protein Hydrolys (FEEDING SUPPLEMENT, PRO-STAT 64,) LIQD Take 30 mLs by mouth 2 (two) times daily. For wound healing    [provider]  aspirin EC 81 MG tablet Take 81 mg by mouth daily. Swallow whole.    [provider]  atorvastatin (LIPITOR) 40 MG tablet Take 1 tablet (40 mg total) by mouth daily. 07/28/21 03/07/24  Andrey Campanile, MD  baclofen (LIORESAL) 10 MG tablet Take 1 tablet (10 mg total) by mouth 3 (three) times daily. 09/16/21    Lynn Ito, MD  Benzocaine 10 MG LOZG Use as directed 1 lozenge in the mouth or throat every 2 (two) hours as needed (sore throat).    [provider]  bisacodyl (DULCOLAX) 10 MG suppository Place 1 suppository (10 mg total) rectally every Tuesday, Thursday, Saturday, and Sunday at 6 PM. 02/15/18   Mariea Clonts, Courage, MD  Brimonidine Tartrate (LUMIFY) 0.025 % SOLN Place 1 drop into both eyes daily as needed (redness).    [provider]  calcium-vitamin D (OSCAL WITH D) 500-200 MG-UNIT tablet Take 1 tablet by mouth 2 (two) times daily.    [provider]  Carboxymethylcellulose Sod PF 1 % GEL Place 1 drop into both eyes in the morning, at noon, in the evening, and at bedtime.    [provider]  carvedilol (COREG) 12.5 MG tablet Take 1 tablet (12.5 mg total) by mouth 2 (two) times daily with a meal. Hold for SBP <100 09/23/21   Rollene Rotunda, MD  cetirizine (ZYRTEC) 10 MG tablet Take 10 mg by mouth daily as needed for allergies.     [provider]  chlorhexidine (PERIDEX) 0.12 % solution Use as directed in the mouth or throat 2 (two) times daily. 01/17/23   [provider]  cholecalciferol (VITAMIN D) 1000 units tablet Take 1,000 Units by mouth daily.    [provider]  Cranberry 450 MG TABS Take 450 mg by mouth daily.    [provider]  diclofenac Sodium (VOLTAREN) 1 % GEL Apply 2 g topically 3 (three) times daily. Apply to bilateral shoulders 07/30/21   [provider]  fenofibrate (TRICOR) 48 MG tablet Take 48 mg by mouth daily. 06/15/22   [provider]  furosemide (LASIX) 40 MG tablet Take 40 mg by mouth 2 (two) times daily. 10/13/21   [provider]  guaifenesin (ROBITUSSIN) 100 MG/5ML syrup Take 200 mg by mouth 4 (four) times daily as needed for cough.    [provider]  hydrocortisone 2.5 % lotion Apply 1 application. topically every 12 (twelve) hours as needed (itching of arms and  legs).    [provider]  hydroquinone 2 % cream Apply 1 application. topically 2 (two) times daily. To dark spots    [provider]  hydroxychloroquine (PLAQUENIL) 200 MG tablet Take 200 mg by mouth daily.     [provider]  ipratropium-albuterol (DUONEB) 0.5-2.5 (3) MG/3ML SOLN Inhale 3 mLs into the lungs every 4 (four) hours as needed. 08/06/17   [provider]  lactulose (CHRONULAC) 10 GM/15ML solution Take 10 g by mouth daily as needed. 04/07/22   [provider]  lactulose, encephalopathy, (CHRONULAC) 10 GM/15ML SOLN Take 10 g by mouth daily.    [provider]  lidocaine (XYLOCAINE) 5 % ointment Apply topically. 03/02/22   [provider]  LINZESS 290 MCG CAPS capsule Take 290 mcg by mouth daily. 07/18/21   [provider]  losartan (COZAAR) 25 MG tablet Take 1 tablet (25 mg total) by mouth daily. 09/23/21 03/06/24  Rollene Rotunda, MD  melatonin 5 MG TABS Take 5 mg by mouth at bedtime.    [provider]  Multiple Vitamin (MULTIVITAMIN WITH MINERALS) TABS tablet Take 1 tablet by mouth daily.    [provider]  omeprazole (PRILOSEC) 40 MG capsule Take 40 mg by mouth daily. 06/11/22   [provider]  ondansetron (ZOFRAN) 4 MG tablet Take 4 mg by mouth every 8 (eight) hours as needed for nausea or vomiting.    [provider]  oxybutynin (DITROPAN-XL) 10 MG 24 hr tablet Take 10 mg by mouth daily. 04/15/22   [provider]  oxymetazoline (AFRIN) 0.05 % nasal spray Place 2 sprays into both nostrils 2 (two) times daily.    [provider]  polyethylene glycol (MIRALAX / GLYCOLAX) 17 g packet Take 17 g by mouth daily. Increase to twice a day as needed 12/26/21   Armbruster, Willaim Rayas, MD  Prenatal Vit-DSS-Fe Fum-FA (PRENATAL 19) 29-1 MG TABS Take 1 tablet by mouth daily at 6 (six) AM.    [provider]  Propylene Glycol (SYSTANE COMPLETE OP) Place 2 drops into both  eyes in the morning, at noon, in the evening, and at bedtime.    [provider]  Simethicone 125 MG TABS Take 125 mg by mouth every 8 (eight) hours as needed (gas pain).    [provider]  sodium chloride (OCEAN) 0.65 % SOLN nasal spray Place 1 spray into both nostrils as needed for congestion.    [provider]  sucralfate (CARAFATE) 1 GM/10ML suspension Take 1 g by mouth every 6 (six) hours as needed (gerd).    [provider]  tizanidine (ZANAFLEX) 6 MG capsule Take 6 mg by mouth 3 (three) times daily. 03/03/22   [provider]  traMADol (ULTRAM) 50 MG tablet Take 50 mg by mouth every 6 (six) hours as needed (muscle spasms).    [provider]  triamcinolone cream (KENALOG) 0.1 % Apply 1 application. topically 2 (two) times daily. 08/06/21   [provider]  zinc sulfate 220 (50 Zn) MG capsule Take 220 mg by mouth daily.    [provider]      Allergies    Patient has no known allergies.    Review of Systems   Review of Systems  Gastrointestinal:  Positive for abdominal pain and vomiting.  All other systems reviewed and are negative.   Physical Exam Updated Vital Signs BP 130/78 (BP Location: Right Arm)   Pulse (!) 110   Temp 97.7 F (36.5 C) (Oral)   Resp (!) 22   Ht 5\' 6"  (1.676 m)   Wt 72.6 kg   SpO2 95%   BMI 25.82 kg/m  Physical Exam Vitals and nursing note reviewed.   73 year old female, resting comfortably and in no acute distress. Vital signs are significant for elevated heart rate and respiratory rate. Oxygen saturation is 95%, which is normal. Head is normocephalic and atraumatic. PERRLA, EOMI. Oropharynx is clear. Neck is nontender and supple. Lungs are clear without rales, wheezes, or rhonchi. Chest is nontender. Heart has regular rate and rhythm without murmur. Abdomen is soft, mildly distended.  Suprapubic catheter is in place.  There is mild epigastric tenderness, no rebound or  guarding. Extremities have no cyanosis or edema. Skin is warm and dry without rash. Neurologic: Mental status is normal, paraplegia present.  ED Results / Procedures / Treatments   Labs (all labs ordered are listed, but only abnormal results are displayed) Labs Reviewed  COMPREHENSIVE METABOLIC PANEL - Abnormal; Notable for the following components:      Result Value   Sodium 129 (*)    Chloride 97 (*)    CO2 20 (*)    Glucose, Bld 133 (*)    Total Protein 9.0 (*)    AST 49 (*)    ALT 56 (*)    Total Bilirubin 1.3 (*)    All other components within normal limits  CBC WITH DIFFERENTIAL/PLATELET - Abnormal; Notable for the following components:   RBC 5.86 (*)    Hemoglobin 15.3 (*)    HCT 47.6 (*)    RDW 16.5 (*)    Lymphs Abs 0.2 (*)    All other components within normal limits  URINALYSIS, W/ REFLEX TO CULTURE (INFECTION SUSPECTED) - Abnormal; Notable for the following components:   APPearance CLOUDY (*)    Hgb urine dipstick MODERATE (*)    Protein, ur 100 (*)    Nitrite POSITIVE (*)    Leukocytes,Ua LARGE (*)    Bacteria, UA RARE (*)    All other components within normal limits  URINE CULTURE  LIPASE, BLOOD    EKG EKG Interpretation Date/Time:  Thursday August 19 2023 02:08:11 EST Ventricular Rate:  105 PR Interval:  160 QRS Duration:  90 QT Interval:  342 QTC Calculation: 452 R Axis:   0  Text Interpretation: Sinus tachycardia Abnormal R-wave progression, early transition LVH with secondary repolarization abnormality When compared with ECG of 05/20/2022, has changed has increased Nonspecific ST abnormality is now present Confirmed by Dione Booze ([id number]) on 08/19/2023 2:13:11 AM  Radiology CT ABDOMEN PELVIS W CONTRAST Result Date: 08/19/2023 CLINICAL DATA:  Vomiting and abdominal distension. Clinical concern for bowel obstruction. EXAM: CT ABDOMEN AND PELVIS WITH CONTRAST TECHNIQUE: Multidetector CT imaging of the abdomen and pelvis was performed using the  standard protocol following bolus administration of intravenous contrast. RADIATION  DOSE REDUCTION: This exam was performed according to the departmental dose-optimization program which includes automated exposure control, adjustment of the mA and/or kV according to patient size and/or use of iterative reconstruction technique. CONTRAST:  75mL OMNIPAQUE IOHEXOL 350 MG/ML SOLN COMPARISON:  05/10/2023 FINDINGS: Lower chest: Dependent atelectasis noted both lower lobes, right greater than left. Hepatobiliary: 3.8 x 1.8 cm mixed density lesion posterior right liver is stable in the interval, compatible with previously reported ablation defect. Gallbladder is surgically absent. No intrahepatic or extrahepatic biliary dilation. Pancreas: No focal mass lesion. No dilatation of the main duct. No intraparenchymal cyst. No peripancreatic edema. Spleen: Stable 8.5 cm complex rim calcified cystic lesion inferior spleen, likely sequelae of prior infection or trauma. Adrenals/Urinary Tract: Nodular thickening of both adrenal glands is stable. Tiny well-defined homogeneous low-density lesions in both kidneys are too small to characterize but are statistically most likely benign and probably cysts. No followup imaging is recommended. No evidence for hydroureter. Bladder is decompressed by suprapubic tube. Stomach/Bowel: Large hiatal hernia. Duodenum is normally positioned as is the ligament of Treitz. No CT features to suggest overt small bowel obstruction. Fine detail in the anterior upper pelvis is obscured by beam hardening artifact from metallic jewelry on the patient's right wrist. The terminal ileum is normal. The appendix is not visualized and may be obscured by beam hardening artifact and motion degradation. No definite edema or inflammation in the region of the cecal tip. Colon is diffusely distended with gas and stool with prominent stool volume in the distal sigmoid colon and rectum. Patient has a diffusely elongated and  markedly redundant left/sigmoid colon. Vascular/Lymphatic: There is mild atherosclerotic calcification of the abdominal aorta without aneurysm. There is no gastrohepatic or hepatoduodenal ligament lymphadenopathy. No retroperitoneal or mesenteric lymphadenopathy. 11 mm short axis right external iliac node is stable, likely reactive. Upper normal left external iliac lymph node also unchanged. Reproductive: Calcified uterine fibroids.  There is no adnexal mass. Other: No intraperitoneal free fluid. Musculoskeletal: No worrisome lytic or sclerotic osseous abnormality. 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 IMPRESSION: 1. No CT features to suggest overt small bowel obstruction. 2. Colon is diffusely distended with gas and stool with prominent stool volume in the distal sigmoid colon and rectum. Imaging features could be compatible with clinical constipation. 3. Large hiatal hernia. 4. 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 5.  Aortic Atherosclerosis (ICD10-I70.0). Electronically Signed   By: Kennith Center M.D.   On: 08/19/2023 05:35    Procedures Procedures  Cardiac monitor shows sinus tachycardia, per my interpretation.  Medications Ordered in ED Medications  sodium chloride 0.9 % bolus 1,000 mL (has no administration in time range)  ondansetron (ZOFRAN) injection 4 mg (has no administration in time range)    ED Course/ Medical Decision Making/ A&P                                 Medical Decision Making Amount and/or Complexity of Data Reviewed Labs: ordered. Radiology: ordered.  Risk Prescription drug management.   Nausea and vomiting concerning for possible bowel obstruction.  Doubt significant GI bleed as emesis was clear initially.  Possible viral gastritis.  Doubt cholecystitis, pancreatitis.  I have ordered IV fluids, ondansetron for nausea.  I have ordered laboratory workup of CBC, comprehensive metabolic panel, lipase, urinalysis.  I have ordered CT of  abdomen and pelvis to look for possible bowel obstruction or other significant intra-abdominal  pathology.  I have reviewed her past records, and on 05/10/2023 she had CT of abdomen and pelvis which showed cirrhosis and otherwise unremarkable.  I have reviewed her electrocardiogram, my interpretation is sinus tachycardia, left ventricular hypertrophy with secondary repolarization changes.  I have reviewed her laboratory test, and my interpretation is probable UTI-urine with greater than 50 WBCs and positive nitrite, mild hyponatremia which is not felt to be clinically significant, mild elevation of transaminases which have been present previously and is felt not to be clinically significant, borderline elevated total bilirubin not felt to be clinically significant, normal lipase, normal CBC.  CT of abdomen and pelvis shows no acute process but diffusely distended colon possibly consistent with chronic constipation.  I have independently viewed the images, and agree with the radiologist's interpretation.  Patient has been resting comfortably in is asking for something to eat.  I have ordered a dose of ceftriaxone.  Patient has requested an enema and I have ordered a soapsuds enema.  I am discharging her with prescription for cefdinir.  She will need to work with her providers at the facility to get on a good bowel regimen.  Final Clinical Impression(s) / ED Diagnoses Final diagnoses:  Nausea and vomiting, unspecified vomiting type  Urinary tract infection with hematuria, site unspecified  Hyponatremia  Elevated transaminase level  Paraplegia (HCC)    Rx / DC Orders ED Discharge Orders          Ordered    cefdinir (OMNICEF) 300 MG capsule  2 times daily        08/19/23 0551    ondansetron (ZOFRAN-ODT) 4 MG disintegrating tablet  Every 8 hours PRN        08/19/23 0552              Dione Booze, MD 08/19/23 Josefine Class    Dione Booze, MD 08/19/23 [phone]

## 2023-08-19 NOTE — Discharge Instructions (Signed)
You were seen today for concerns of abdominal pain and vomiting. Based on your workup, it appears that you currently have  UTI so I have sent a prescription for Omnicef to your pharmacy. Take this as prescribed. I have also sent Zofran for nausea and vomiting as needed. Return to the ER for new or worsening symptoms.

## 2023-08-22 LAB — URINE CULTURE: Culture: >=100000 — AB

## 2023-08-23 ENCOUNTER — Telehealth (HOSPITAL_BASED_OUTPATIENT_CLINIC_OR_DEPARTMENT_OTHER): Payer: Self-pay | Admitting: *Deleted

## 2023-08-23 ENCOUNTER — Ambulatory Visit: Payer: 59 | Admitting: Physical Medicine and Rehabilitation

## 2023-08-23 NOTE — Progress Notes (Signed)
ED Antimicrobial Stewardship Positive Culture Follow Up   [name] is an 73 y.o. female who presented to [facility] with a chief complaint of  Chief Complaint  Patient presents with   Abdominal Pain   Emesis    Recent Results (from the past 720 hours)  Urine Culture     Status: Abnormal   Collection Time: 08/19/23  3:50 AM   Specimen: Urine, Random  Result Value Ref Range Status   Specimen Description URINE, RANDOM  Final   Special Requests   Final    NONE Reflexed from [id number] Performed at [facility], [address], [place], [place] [address]    Culture (A)  Final    >=100,000 COLONIES/mL SERRATIA MARCESCENS 30,000 COLONIES/mL PSEUDOMONAS AERUGINOSA 10,000 COLONIES/mL METHICILLIN RESISTANT STAPHYLOCOCCUS AUREUS    Report Status 08/22/2023 FINAL  Final   Organism ID, Bacteria SERRATIA MARCESCENS (A)  Final   Organism ID, Bacteria PSEUDOMONAS AERUGINOSA (A)  Final   Organism ID, Bacteria METHICILLIN RESISTANT STAPHYLOCOCCUS AUREUS (A)  Final      Susceptibility   Methicillin resistant staphylococcus aureus - MIC*    CIPROFLOXACIN >=8 RESISTANT Resistant     GENTAMICIN <=0.5 SENSITIVE Sensitive     NITROFURANTOIN <=16 SENSITIVE Sensitive     OXACILLIN >=4 RESISTANT Resistant     TETRACYCLINE <=1 SENSITIVE Sensitive     VANCOMYCIN 1 SENSITIVE Sensitive     TRIMETH/SULFA <=10 SENSITIVE Sensitive     RIFAMPIN <=0.5 SENSITIVE Sensitive     Inducible Clindamycin NEGATIVE Sensitive     LINEZOLID 2 SENSITIVE Sensitive     * 10,000 COLONIES/mL METHICILLIN RESISTANT STAPHYLOCOCCUS AUREUS   Pseudomonas aeruginosa - MIC*    CEFTAZIDIME <=1 SENSITIVE Sensitive     CIPROFLOXACIN <=0.25 SENSITIVE Sensitive     GENTAMICIN <=1 SENSITIVE Sensitive     IMIPENEM 1 SENSITIVE Sensitive     PIP/TAZO <=4 SENSITIVE Sensitive ug/mL    CEFEPIME 0.5 SENSITIVE Sensitive     * 30,000 COLONIES/mL PSEUDOMONAS AERUGINOSA   Serratia marcescens - MIC*    CEFEPIME <=0.12 SENSITIVE  Sensitive     CEFTRIAXONE <=0.25 SENSITIVE Sensitive     CIPROFLOXACIN 1 RESISTANT Resistant     GENTAMICIN <=1 SENSITIVE Sensitive     NITROFURANTOIN 128 RESISTANT Resistant     TRIMETH/SULFA >=320 RESISTANT Resistant     * >=100,000 COLONIES/mL SERRATIA MARCESCENS    Treated with cefdinir, organism resistant to prescribed antimicrobial  Given poor urinary penetration of cefdinir, will switch antibiotic therapy. Given the low colony count of MRSA and pseudomonas, will hold covering those organisms with antibiotics at this time.   New antibiotic prescription: keflex 500mg  BID x 5 days  ED Provider: Estanislado Pandy, DO   [name] 08/23/2023, 7:35 AM Clinical Pharmacist Monday - Friday phone -  [phone] Saturday - Sunday phone - [phone]

## 2023-08-23 NOTE — Telephone Encounter (Signed)
Post ED Visit - Positive Culture Follow-up: Successful Patient Follow-Up  Culture assessed and recommendations reviewed by:  [x]  Ivery Quale, Pharm.D. []  Celedonio Miyamoto, Pharm.D., BCPS AQ-ID []  Garvin Fila, Pharm.D., BCPS []  Georgina Pillion, Pharm.D., BCPS []  [place], [address].D., BCPS, AAHIVP []  Estella Husk, Pharm.D., BCPS, AAHIVP []  [name], PharmD, BCPS []  Phillips Climes, PharmD, BCPS []  Agapito Games, PharmD, BCPS []  Verlan Friends, PharmD  Positive urine culture  []  Patient discharged without antimicrobial prescription and treatment is now indicated [x]  Organism is resistant to prescribed ED discharge antimicrobial []  Patient with positive blood cultures  Changes discussed with ED provider: Feliz Beam young New antibiotic prescription Keflex 500 mg BID x 5 days  Called to [phone], Spoke to [place], [place] LPN  Patient is a resident at [facility]. Spoke to [employer] LPN and she took new antibiotic information over the phone. Faxed copy of cx report faxed to [phone]  Contacted patient, date 08/23/23, time 1000   Bing Quarry 08/23/2023, 10:00 AM

## 2023-08-25 ENCOUNTER — Ambulatory Visit: Payer: 59 | Admitting: Physical Medicine and Rehabilitation
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
## 2023-09-02 ENCOUNTER — Encounter: Payer: 59 | Admitting: Vascular Surgery

## 2023-09-02 ENCOUNTER — Ambulatory Visit (HOSPITAL_COMMUNITY): Payer: 59 | Attending: Vascular Surgery

## 2023-09-15 ENCOUNTER — Encounter: Payer: 59 | Admitting: Physical Medicine and Rehabilitation

## 2023-09-29 IMAGING — DX DG PORTABLE PELVIS
1 series · 1 of 1 positions shown · non-contrast
Comparison: None.

CLINICAL DATA: Catheter placement

EXAM:
PORTABLE PELVIS 1-2 VIEWS

[pelvis ap]
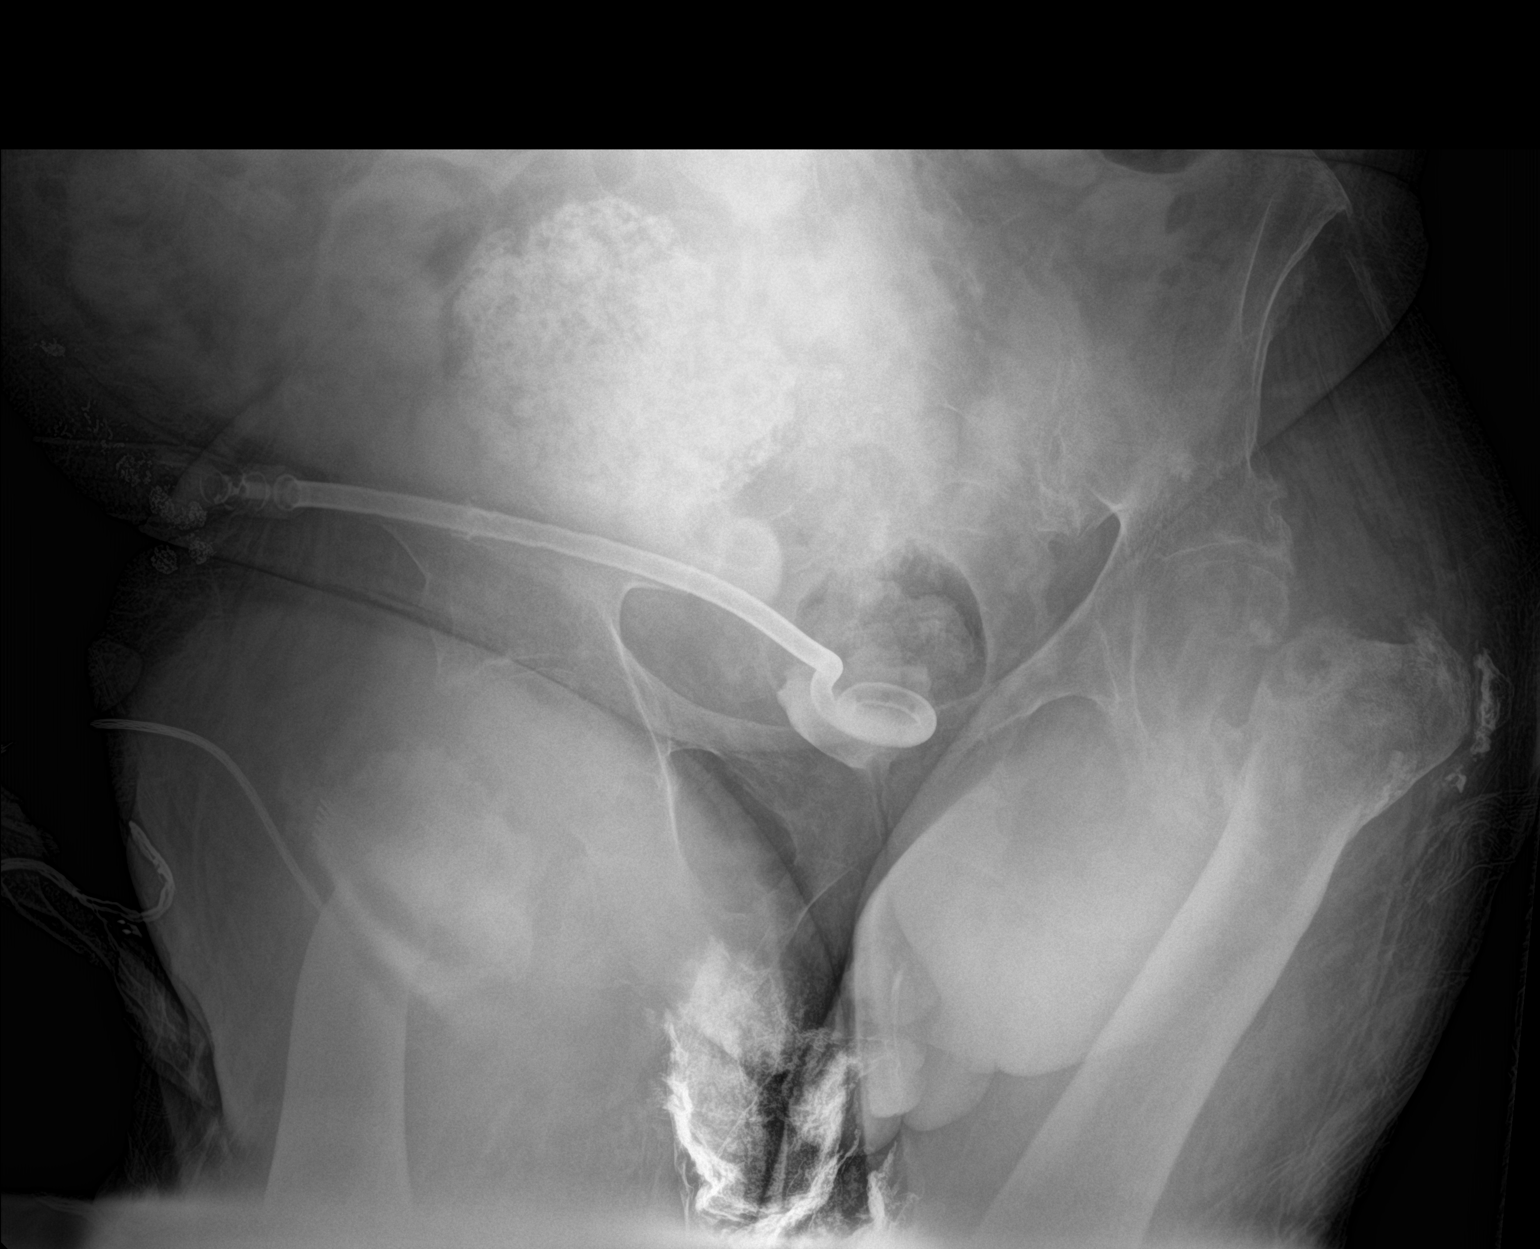

[1 of 1 positions shown; findings below may reference images not displayed]

FINDINGS: Contrast injected through a suprapubic catheter shows opacification
in the expected location of the urinary bladder. Severe deformity of
both proximal femurs. Large calcified uterine fibroid.
IMPRESSION: Contrast injected through a suprapubic catheter opacifies the
urinary bladder.

## 2023-09-29 IMAGING — DX DG ABDOMEN 1V
1 series · 1 of 1 positions shown · non-contrast
Comparison: 10/10/2021.

CLINICAL DATA: Suprapubic placement.

EXAM:
ABDOMEN - 1 VIEW

[abdomen kub]
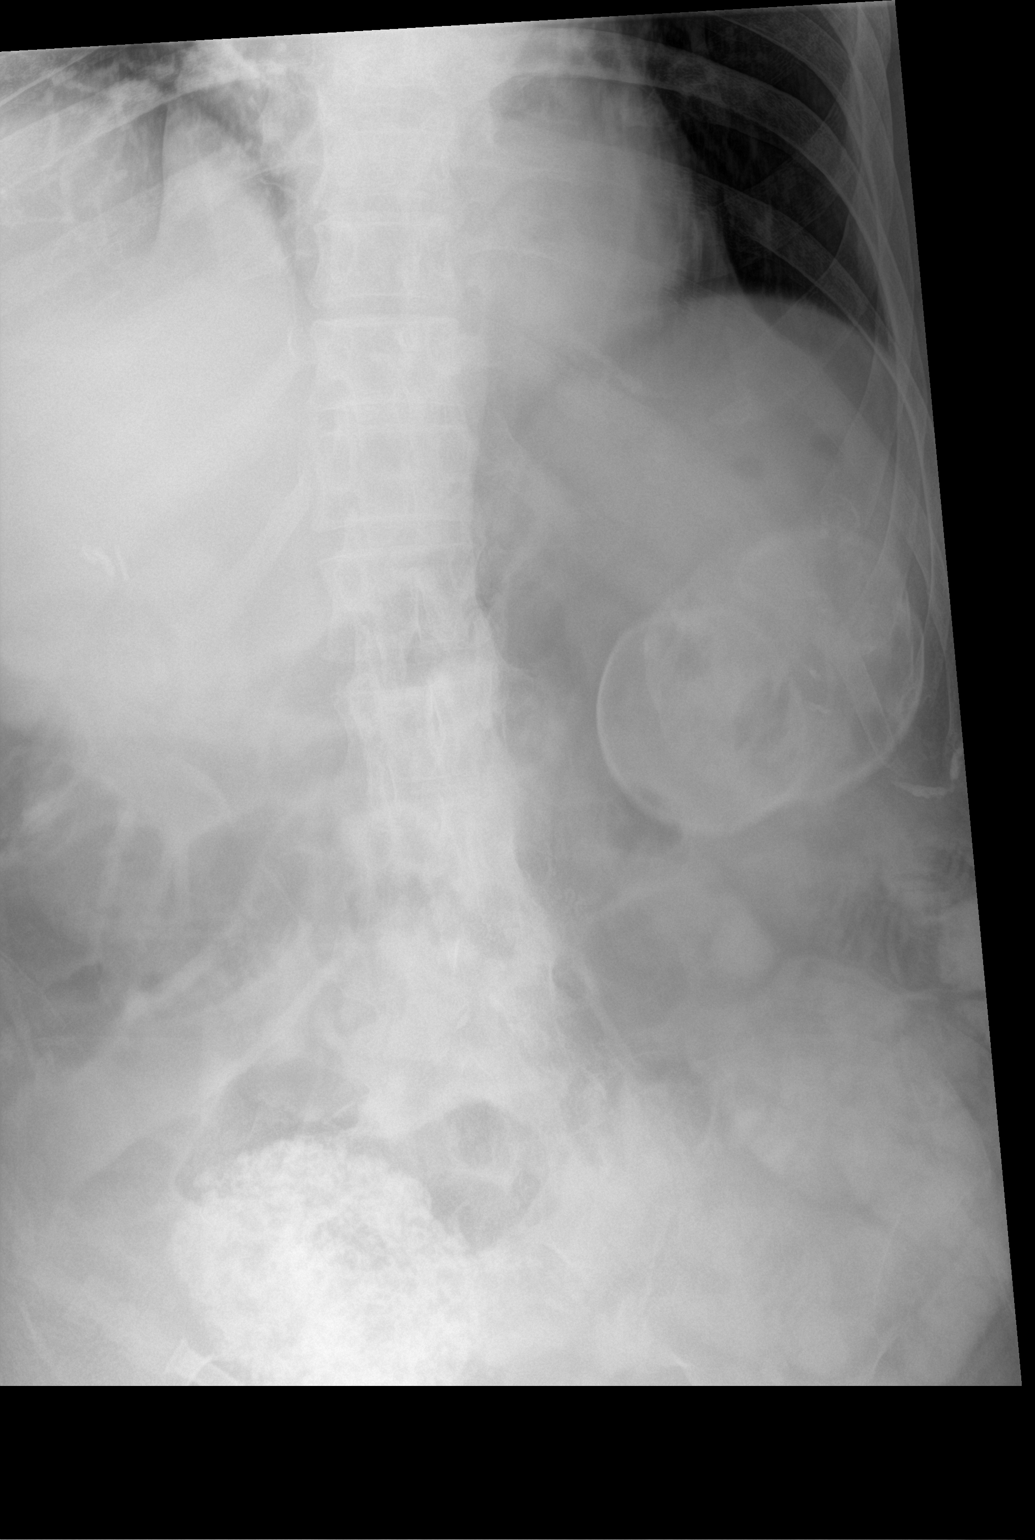

[1 of 1 positions shown; findings below may reference images not displayed]

FINDINGS: The bowel gas pattern is normal. Rim calcified structures are
present in the left upper quadrant, compatible with known splenic
cyst. Coarse calcified mass is present in the right lower quadrant,
corresponding to known uterine fibroid. No renal calculus is
identified. A suprapubic catheter is not seen on imaging due to
limited field of view.
IMPRESSION: 1. Suprapubic catheter is not seen on exam due to limited field of
view. Repeat evaluation may be obtained if clinically warranted.
2. Nonobstructive bowel-gas pattern.

## 2023-10-24 DIAGNOSIS — E785 Hyperlipidemia, unspecified: Secondary | ICD-10-CM | POA: Insufficient documentation

## 2023-10-24 NOTE — Progress Notes (Deleted)
  Cardiology Office Note:   Date:  10/24/2023  ID:  [name], DOB 1950-10-12, MRN [id number] PCP: System, Provider Not In  [facility] Providers Cardiologist:  Rollene Rotunda, MD {  History of Present Illness:   [name] is a 73 y.o. female who presents for follow up of congestive heart failure.  She was in the hospital in Dec 2018.   In 2016 she had a normal EF.  However, during the recent hospitalization she had an EF of 20%. Cath demonstrated normal coronaries.  She had thoracentesis of 1.2 liters on the left.  She went back to the nursing home where she lives but required another thoracentesis.   In Sept 2022 the EF was up to 45 - 50%.  She has had many medical issues including amputation of toes, treatment for hepatocellular carcinoma, suprapubic catheter for neurogenic bladder, AKI/UTI.     She presents for follow up.  ***   ***  She is having problems with the urinary tract infection currently and is being managed.  She had COVID in January.  She had a cough resultant from this.  She is slowly recovering from all of this.  She gets around in a motorized wheelchair.  She is in skilled care.  She does pretty well.  She denies any pain.  She is having no new shortness of breath, PND or orthopnea.  She is having no new palpitations, presyncope or syncope.    Reports that she is having some wounds on the her foot that needs to be addressed.  She is going to see the wound clinic    ROS: ***  Studies Reviewed:    EKG:       ***  Risk Assessment/Calculations:   {Does this patient have ATRIAL FIBRILLATION?:[id number]} No BP recorded.  {Refresh Note OR Click here to enter BP  :1}***        Physical Exam:   VS:  There were no vitals taken for this visit.   Wt Readings from Last 3 Encounters:  08/19/23 160 lb (72.6 kg)  06/09/23 167 lb (75.8 kg)  06/08/23 166 lb (75.3 kg)     GEN: Well nourished, well developed in no acute distress NECK: No JVD; No carotid  bruits CARDIAC: ***RR, *** murmurs, rubs, gallops RESPIRATORY:  Clear to auscultation without rales, wheezing or rhonchi  ABDOMEN: Soft, non-tender, non-distended EXTREMITIES:  No edema; No deformity   ASSESSMENT AND PLAN:   NICM (nonischemic cardiomyopathy) (HCC):  EF was 45%.  ***  EF was 45%.  She will continue the meds as listed.  She seems to be euvolemic.  No change in therapy.  Of note she does have a low blood pressure.  She has hold orders for her beta-blocker.  Typically she says her blood pressure is not this low if her urinary tract infection is treated.   Dyslipidemia:   ***   Her LDL is 147.  She would prefer not to take a statin.  She had normal coronaries a few years ago on cath.  I think is very reasonable for her to manage her lipids with diet alone and I have suggested this and will defer to her primary provider.     Follow up ***  Signed, Rollene Rotunda, MD

## 2023-10-27 ENCOUNTER — Ambulatory Visit: Payer: 59 | Admitting: Cardiology

## 2023-10-27 ENCOUNTER — Telehealth: Payer: Self-pay | Admitting: *Deleted

## 2023-10-27 ENCOUNTER — Inpatient Hospital Stay: Payer: 59 | Attending: Oncology | Admitting: Oncology

## 2023-10-27 DIAGNOSIS — Z8505 Personal history of malignant neoplasm of liver: Secondary | ICD-10-CM | POA: Insufficient documentation

## 2023-10-27 DIAGNOSIS — Z9013 Acquired absence of bilateral breasts and nipples: Secondary | ICD-10-CM | POA: Insufficient documentation

## 2023-10-27 DIAGNOSIS — Z853 Personal history of malignant neoplasm of breast: Secondary | ICD-10-CM | POA: Insufficient documentation

## 2023-10-27 DIAGNOSIS — E785 Hyperlipidemia, unspecified: Secondary | ICD-10-CM

## 2023-10-27 DIAGNOSIS — I428 Other cardiomyopathies: Secondary | ICD-10-CM

## 2023-10-27 NOTE — Telephone Encounter (Signed)
 Called to f/u on patient status regarding missed appointment today. Spoke w/Anglea at facility and they were not aware of the appointment. Rescheduled for 4/28 at 0940 and faxed appointment calendar to facility.

## 2023-10-29 ENCOUNTER — Encounter: Payer: Self-pay | Admitting: Physical Medicine and Rehabilitation

## 2023-10-29 ENCOUNTER — Encounter: Payer: 59 | Attending: Physical Medicine and Rehabilitation | Admitting: Physical Medicine and Rehabilitation

## 2023-10-29 VITALS — BP 129/79 | HR 94 | Ht 66.0 in

## 2023-10-29 DIAGNOSIS — G8220 Paraplegia, unspecified: Secondary | ICD-10-CM | POA: Diagnosis present

## 2023-10-29 DIAGNOSIS — Z993 Dependence on wheelchair: Secondary | ICD-10-CM | POA: Diagnosis present

## 2023-10-29 DIAGNOSIS — M7918 Myalgia, other site: Secondary | ICD-10-CM | POA: Diagnosis present

## 2023-10-29 DIAGNOSIS — M25512 Pain in left shoulder: Secondary | ICD-10-CM | POA: Diagnosis present

## 2023-10-29 DIAGNOSIS — M7521 Bicipital tendinitis, right shoulder: Secondary | ICD-10-CM

## 2023-10-29 DIAGNOSIS — M25511 Pain in right shoulder: Secondary | ICD-10-CM

## 2023-10-29 MED ORDER — LIDOCAINE HCL 1 % IJ SOLN
6.0000 mL | Freq: Once | INTRAMUSCULAR | Status: AC
  Administered 2023-10-29: 6 mL
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
## 2023-11-09 ENCOUNTER — Other Ambulatory Visit: Payer: Self-pay

## 2023-11-09 ENCOUNTER — Emergency Department (HOSPITAL_COMMUNITY)
Admission: EM | Admit: 2023-11-09 | Discharge: 2023-11-10 | Disposition: A | Discharge status: Home / Self Care | Attending: Emergency Medicine | Admitting: Emergency Medicine

## 2023-11-09 DIAGNOSIS — T839XXA Unspecified complication of genitourinary prosthetic device, implant and graft, initial encounter: Secondary | ICD-10-CM | POA: Insufficient documentation

## 2023-11-09 DIAGNOSIS — G8220 Paraplegia, unspecified: Secondary | ICD-10-CM | POA: Insufficient documentation

## 2023-11-09 DIAGNOSIS — Y732 Prosthetic and other implants, materials and accessory gastroenterology and urology devices associated with adverse incidents: Secondary | ICD-10-CM | POA: Insufficient documentation

## 2023-11-09 DIAGNOSIS — Z7982 Long term (current) use of aspirin: Secondary | ICD-10-CM | POA: Insufficient documentation

## 2023-11-09 DIAGNOSIS — R8290 Unspecified abnormal findings in urine: Secondary | ICD-10-CM | POA: Diagnosis not present

## 2023-11-09 DIAGNOSIS — K921 Melena: Secondary | ICD-10-CM | POA: Insufficient documentation

## 2023-11-09 LAB — COMPREHENSIVE METABOLIC PANEL WITH GFR
ALT: 25 U/L (ref 0–44)
AST: 18 U/L (ref 15–41)
Albumin: 3.2 g/dL — ABNORMAL LOW (ref 3.5–5.0)
Alkaline Phosphatase: 84 U/L (ref 38–126)
Anion gap: 11 (ref 5–15)
BUN: 18 mg/dL (ref 8–23)
CO2: 23 mmol/L (ref 22–32)
Calcium: 9.6 mg/dL (ref 8.9–10.3)
Chloride: 103 mmol/L (ref 98–111)
Creatinine, Ser: 0.7 mg/dL (ref 0.44–1.00)
GFR, Estimated: >60 mL/min (ref >60)
Glucose, Bld: 152 mg/dL — ABNORMAL HIGH (ref 70–99)
Potassium: 3.1 mmol/L — ABNORMAL LOW (ref 3.5–5.1)
Sodium: 137 mmol/L (ref 135–145)
Total Bilirubin: 0.6 mg/dL (ref 0.0–1.2)
Total Protein: 7.6 g/dL (ref 6.5–8.1)

## 2023-11-09 LAB — URINALYSIS, W/ REFLEX TO CULTURE (INFECTION SUSPECTED)
Bilirubin Urine: NEGATIVE
Glucose, UA: NEGATIVE mg/dL
Hgb urine dipstick: NEGATIVE
Ketones, ur: NEGATIVE mg/dL
Nitrite: NEGATIVE
Protein, ur: >=300 mg/dL — AB
Specific Gravity, Urine: 1.017 (ref 1.005–1.030)
pH: 9 — ABNORMAL HIGH (ref 5.0–8.0)

## 2023-11-09 LAB — CBC WITH DIFFERENTIAL/PLATELET
Abs Immature Granulocytes: 0.05 10*3/uL (ref 0.00–0.07)
Basophils Absolute: 0 10*3/uL (ref 0.0–0.1)
Basophils Relative: 0 %
Eosinophils Absolute: 0.2 10*3/uL (ref 0.0–0.5)
Eosinophils Relative: 2 %
HCT: 41.5 % (ref 36.0–46.0)
Hemoglobin: 13 g/dL (ref 12.0–15.0)
Immature Granulocytes: 1 %
Lymphocytes Relative: 18 %
Lymphs Abs: 1.5 10*3/uL (ref 0.7–4.0)
MCH: 25.7 pg — ABNORMAL LOW (ref 26.0–34.0)
MCHC: 31.3 g/dL (ref 30.0–36.0)
MCV: 82 fL (ref 80.0–100.0)
Monocytes Absolute: 0.4 10*3/uL (ref 0.1–1.0)
Monocytes Relative: 5 %
Neutro Abs: 6.1 10*3/uL (ref 1.7–7.7)
Neutrophils Relative %: 74 %
Platelets: 215 10*3/uL (ref 150–400)
RBC: 5.06 MIL/uL (ref 3.87–5.11)
RDW: 15.6 % — ABNORMAL HIGH (ref 11.5–15.5)
WBC: 8.4 10*3/uL (ref 4.0–10.5)
nRBC: 0 % (ref 0.0–0.2)

## 2023-11-09 LAB — MAGNESIUM: Magnesium: 1.8 mg/dL (ref 1.7–2.4)

## 2023-11-09 NOTE — Discharge Instructions (Addendum)
 Return if any problems.

## 2023-11-09 NOTE — ED Notes (Signed)
 Patient discharged in stable condition, awaiting transport back to SNF. Unable to call report to facility after five attempts, all extensions go to voicemail.

## 2023-11-09 NOTE — ED Notes (Signed)
This RN attempted IV access X 2 with no success.

## 2023-11-09 NOTE — ED Triage Notes (Signed)
 AS per EMS, patient coming from SNF for obstructed indwelling urinary catheter, patient states possible obstruction since last Thursday. EMS further states patient had blood in stool per SNF.

## 2023-11-09 NOTE — ED Provider Notes (Signed)
 [place] EMERGENCY DEPARTMENT AT [facility] Provider Note   CSN: [id number] Arrival date & time: 11/09/23  1958     History  Chief Complaint  Patient presents with   URINARY CATHETER PROBLEM    [name] is a 73 y.o. female.  Patient reports that she has a suprapubic catheter that is blocked.  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  Patient states that she had catheter replaced a week ago but it appears to be blocked again.  Patient reports that she has not had any fever or chills.  Patient is a paraplegic and she reports she has not felt any discomfort.  Patient states that she was told that there was also some blood in her stool.  Patient does have a history of hemorrhoids.  Patient denies any other complaints  The history is provided by the patient. No language interpreter was used.       Home Medications Prior to Admission medications   Medication Sig Start Date End Date Taking? Authorizing Provider  acetaminophen (TYLENOL) 325 MG tablet Take 650 mg by mouth every 6 (six) hours as needed.    [provider]  albuterol (VENTOLIN HFA) 108 (90 Base) MCG/ACT inhaler Inhale 2 puffs into the lungs every 4 (four) hours as needed for wheezing or shortness of breath.    [provider]  alum & mag hydroxide-simeth (MAALOX PLUS) 400-400-40 MG/5ML suspension Take 10 mLs by mouth every 6 (six) hours as needed for indigestion, heartburn or flatulence (nausea).    [provider]  aspirin EC 81 MG tablet Take 81 mg by mouth daily. Swallow whole.    [provider]  atorvastatin (LIPITOR) 40 MG tablet Take 1 tablet (40 mg total) by mouth daily. Patient taking differently: Take 40 mg by mouth at bedtime. 07/28/21 03/07/24  Garlin Junker, MD  baclofen (LIORESAL) 10 MG tablet Take 1 tablet (10 mg total) by mouth 3 (three) times daily. 09/16/21   Garnet Just, MD  Benzocaine 10 MG LOZG Use as directed 1 lozenge in the mouth or throat every 2  (two) hours as needed (sore throat).    [provider]  bisacodyl (DULCOLAX) 10 MG suppository Place 1 suppository (10 mg total) rectally every Tuesday, Thursday, Saturday, and Sunday at 6 PM. 02/15/18   Quintella Buck, Courage, MD  Brimonidine Tartrate (LUMIFY) 0.025 % SOLN Place 1 drop into both eyes daily as needed (redness).    [provider]  calcium-vitamin D (OSCAL WITH D) 500-200 MG-UNIT tablet Take 1 tablet by mouth 2 (two) times daily.    [provider]  Carbomer Gel Base (HYDROGEL) GEL 1 application  by Does not apply route 2 (two) times daily. Apply to labia for wound care    [provider]  Carboxymethylcellulose Sod PF 1 % GEL Place 1 drop into both eyes 4 (four) times daily as needed (dry eyes).    [provider]  carvedilol (COREG) 12.5 MG tablet Take 1 tablet (12.5 mg total) by mouth 2 (two) times daily with a meal. Hold for SBP <100 Patient taking differently: Take 12.5 mg by mouth 2 (two) times daily with a meal. Hold for SBP <110 09/23/21   Eilleen Grates, MD  cefdinir (OMNICEF) 300 MG capsule Take 1 capsule (300 mg total) by mouth 2 (two) times daily. 08/19/23   Alissa April, MD  cetirizine (ZYRTEC) 10 MG tablet Take 10 mg by mouth daily as needed for allergies.     [provider]  cholecalciferol (VITAMIN D) 1000 units tablet Take 1,000 Units by mouth daily.    [provider]  Cranberry 450 MG TABS Take 450 mg by mouth daily.    [provider]  dextromethorphan-guaiFENesin (TUSSIN DM) 10-100 MG/5ML liquid Take 15 mLs by mouth every 4 (four) hours as needed for cough.    [provider]  diclofenac Sodium (VOLTAREN) 1 % GEL Apply 2 g topically 3 (three) times daily. Apply to bilateral shoulders 07/30/21   [provider]  ferrous gluconate (FERGON) 324 MG tablet Take 324 mg by mouth daily with breakfast.    [provider]  fluconazole (DIFLUCAN) 150 MG tablet Take 150 mg by mouth once.  08/07/23   [provider]  furosemide (LASIX) 40 MG tablet Take 40 mg by mouth 2 (two) times daily. 10/13/21   [provider]  guaifenesin (ROBITUSSIN) 100 MG/5ML syrup Take 200 mg by mouth 4 (four) times daily as needed for cough.    [provider]  hydrocortisone 2.5 % lotion Apply 1 application. topically every 12 (twelve) hours as needed (itching of arms and legs).    [provider]  hydroquinone 2 % cream Apply 1 application. topically 2 (two) times daily. To dark spots    [provider]  hydroxychloroquine (PLAQUENIL) 200 MG tablet Take 200 mg by mouth daily.     [provider]  hydrOXYzine (ATARAX) 25 MG tablet Take 25 mg by mouth every 6 (six) hours as needed for itching.    [provider]  ipratropium-albuterol (DUONEB) 0.5-2.5 (3) MG/3ML SOLN Inhale 3 mLs into the lungs every 6 (six) hours as needed. 08/06/17   [provider]  lactulose (CHRONULAC) 10 GM/15ML solution Take 10 g by mouth daily as needed. 04/07/22   [provider]  lidocaine (XYLOCAINE) 5 % ointment Apply 1 Application topically daily as needed (Apply to axillae/abdomen). 03/02/22   [provider]  LINZESS 290 MCG CAPS capsule Take 290 mcg by mouth at bedtime. 07/18/21   [provider]  losartan (COZAAR) 25 MG tablet Take 1 tablet (25 mg total) by mouth daily. 09/23/21 03/06/24  Rollene Rotunda, MD  melatonin 3 MG TABS tablet Take 5 mg by mouth at bedtime as needed.    [provider]  omeprazole (PRILOSEC) 20 MG capsule Take 20 mg by mouth 2 (two) times daily. 06/11/22   [provider]  ondansetron (ZOFRAN) 4 MG tablet Take 4 mg by mouth every 8 (eight) hours as needed for nausea or vomiting.    [provider]  ondansetron (ZOFRAN-ODT) 4 MG disintegrating tablet Take 1 tablet (4 mg total) by mouth every 8 (eight) hours as needed for nausea or vomiting. 08/19/23   Dione Booze, MD  oxybutynin  (DITROPAN-XL) 10 MG 24 hr tablet Take 10 mg by mouth daily. 04/15/22   [provider]  oxymetazoline (AFRIN) 0.05 % nasal spray Place 2 sprays into both nostrils 2 (two) times daily as needed (use at the start of nosebleed).    [provider]  polyethylene glycol (MIRALAX / GLYCOLAX) 17 g packet Take 17 g by mouth daily. Increase to twice a day as needed Patient taking differently: Take 17 g by mouth 2 (two) times daily. May give an additional dose (17g) as needed 12/26/21   Armbruster, Willaim Rayas, MD  Prenatal Vit-DSS-Fe Fum-FA (PRENATAL 19) 29-1 MG TABS Take 1 tablet by mouth daily at 6 (six) AM.    [provider]  Propylene Glycol (SYSTANE COMPLETE OP) Place 2 drops into both eyes every 6 (six) hours as needed (Dry eyes).    [provider]  Simethicone 125 MG TABS Take 125 mg by mouth every 8 (eight) hours as needed (gas pain).    [provider]  sodium chloride (OCEAN) 0.65 % SOLN nasal spray Place 1 spray into both nostrils as needed for congestion.    [provider]  sucralfate (CARAFATE) 1 GM/10ML suspension Take 1 g by mouth every 6 (six) hours as needed (gerd).    [provider]  sulfamethoxazole-trimethoprim (BACTRIM DS) 800-160 MG tablet Take 1 tablet by mouth 2 (two) times daily. 08/13/23   [provider]  tiZANidine (ZANAFLEX) 4 MG tablet Take 6 mg by mouth 3 (three) times daily. 03/03/22   [provider]  traMADol (ULTRAM) 50 MG tablet Take 50 mg by mouth every 6 (six) hours as needed (muscle spasms).    [provider]  triamcinolone cream (KENALOG) 0.1 % Apply 1 application  topically as needed. 08/06/21   [provider]  zinc sulfate 220 (50 Zn) MG capsule Take 220 mg by mouth daily.    [provider]      Allergies    Patient has no known allergies.    Review of Systems   Review of Systems  Genitourinary:  Positive for decreased urine volume.  All other systems  reviewed and are negative.   Physical Exam Updated Vital Signs BP 119/65 (BP Location: Left Arm)   Pulse 87   Temp 98.2 F (36.8 C) (Oral)   Ht 5\' 6"  (1.676 m)   Wt 73.5 kg   SpO2 97%   BMI 26.15 kg/m  Physical Exam Vitals and nursing note reviewed.  Constitutional:      Appearance: She is well-developed.  HENT:     Head: Normocephalic.  Cardiovascular:     Rate and Rhythm: Normal rate.  Pulmonary:     Effort: Pulmonary effort is normal.  Abdominal:     General: There is no distension.  Genitourinary:    Comments: 1 cm external hemorrhoid erythematous Musculoskeletal:     Cervical back: Normal range of motion.     Comments: Contracted lower extremities  Skin:    General: Skin is warm.  Neurological:     General: No focal deficit present.     Mental Status: She is alert and oriented to person, place, and time.     ED Results / Procedures / Treatments   Labs (all labs ordered are listed, but only abnormal results are displayed) Labs Reviewed  COMPREHENSIVE METABOLIC PANEL WITH GFR - Abnormal; Notable for the following components:      Result Value   Potassium 3.1 (*)    Glucose, Bld 152 (*)    Albumin 3.2 (*)    All other components within normal limits  URINALYSIS, W/ REFLEX TO CULTURE (INFECTION SUSPECTED) - Abnormal; Notable for the following components:   Color, Urine AMBER (*)    APPearance CLOUDY (*)    pH 9.0 (*)    Protein, ur >=300 (*)    Leukocytes,Ua LARGE (*)    Bacteria, UA MANY (*)    All other components within normal limits  CBC WITH DIFFERENTIAL/PLATELET - Abnormal; Notable for the following components:   MCH 25.7 (*)    RDW 15.6 (*)    All other components within normal limits  URINE CULTURE  MAGNESIUM  CBC WITH DIFFERENTIAL/PLATELET  POC OCCULT BLOOD, ED  EKG None  Radiology No results found.  Procedures SUPRAPUBIC TUBE PLACEMENT  Date/Time: 11/09/2023 10:48 PM  Performed by: Sandi Crosby, PA-C Authorized by: Sandi Crosby, PA-C   Consent:    Consent obtained:  Verbal   Consent given by:  Patient   Risks, benefits, and alternatives were discussed: yes     Risks discussed:  Bleeding   Alternatives discussed:  No treatment Universal protocol:    Procedure explained and questions answered to patient or proxy's satisfaction: yes     Immediately prior to procedure, a time out was called: yes     Patient identity confirmed:  Verbally with patient Sedation:    Sedation type:  None Procedure details:    Catheter size:  16 Fr   Ultrasound guidance: no     Number of attempts:  1   Urine characteristics:  Mildly cloudy Post-procedure details:    Procedure completion:  Tolerated well, no immediate complications     Medications Ordered in ED Medications - No data to display  ED Course/ Medical Decision Making/ A&P                                 Medical Decision Making Patient reports suprapubic catheter is obstructed.  Amount and/or Complexity of Data Reviewed Labs: ordered. Decision-making details documented in ED Course.    Details: Labs ordered reviewed and interpreted.  Patient's glucose is 152 white blood cell count is normal.  Risk Risk Details: Suprapubic catheter replaced by me with good drainage.  Patient had a large brown bowel movement no sign of bright red blood.  I suspect blood that was noted was secondary to hemorrhoid.           Final Clinical Impression(s) / ED Diagnoses Final diagnoses:  Complication of Foley catheter, initial encounter [facility])    Rx / DC Orders ED Discharge Orders     None     An After Visit Summary was printed and given to the patient.     Sandi Crosby, PA-C 11/09/23 2252    Merdis Stalling, MD 11/10/23 1640

## 2023-11-10 LAB — URINE CULTURE

## 2023-11-22 ENCOUNTER — Inpatient Hospital Stay (HOSPITAL_BASED_OUTPATIENT_CLINIC_OR_DEPARTMENT_OTHER): Admitting: Oncology

## 2023-11-22 VITALS — BP 142/74 | HR 69 | Temp 98.1°F | Resp 18 | Ht 66.0 in

## 2023-11-22 DIAGNOSIS — Z9013 Acquired absence of bilateral breasts and nipples: Secondary | ICD-10-CM | POA: Diagnosis not present

## 2023-11-22 DIAGNOSIS — C220 Liver cell carcinoma: Secondary | ICD-10-CM | POA: Diagnosis not present

## 2023-11-22 DIAGNOSIS — Z8505 Personal history of malignant neoplasm of liver: Secondary | ICD-10-CM | POA: Diagnosis present

## 2023-11-22 DIAGNOSIS — Z853 Personal history of malignant neoplasm of breast: Secondary | ICD-10-CM | POA: Diagnosis not present
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
## 2023-11-22 NOTE — Addendum Note (Signed)
 Addended by: Elsa Halls on: 11/22/2023 02:20 PM   Modules accepted: Orders

## 2023-11-23 ENCOUNTER — Telehealth: Payer: Self-pay | Admitting: Oncology

## 2023-11-23 NOTE — Telephone Encounter (Signed)
 Pending CT Appt.    Follow-up disposition: Return for Lab, Scan, office.  Check out comments: Lab and CT, then office same day week of 08/07/24, 30 min office
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
## 2023-11-26 ENCOUNTER — Emergency Department (HOSPITAL_COMMUNITY)

## 2023-11-26 ENCOUNTER — Encounter (HOSPITAL_COMMUNITY)
Admission: EM | Disposition: A | Discharge status: Skilled Nursing Facility | Payer: Self-pay | Source: Skilled Nursing Facility | Attending: Internal Medicine

## 2023-11-26 ENCOUNTER — Ambulatory Visit: Admitting: Gastroenterology

## 2023-11-26 ENCOUNTER — Inpatient Hospital Stay (HOSPITAL_COMMUNITY)
Admission: EM | Admit: 2023-11-26 | Discharge: 2023-12-03 | DRG: 853 | Disposition: A | Discharge status: Skilled Nursing Facility | Source: Skilled Nursing Facility | Attending: Internal Medicine | Admitting: Internal Medicine

## 2023-11-26 ENCOUNTER — Inpatient Hospital Stay (HOSPITAL_COMMUNITY)

## 2023-11-26 ENCOUNTER — Other Ambulatory Visit: Payer: Self-pay

## 2023-11-26 ENCOUNTER — Encounter (HOSPITAL_COMMUNITY): Payer: Self-pay | Admitting: Emergency Medicine

## 2023-11-26 DIAGNOSIS — Z993 Dependence on wheelchair: Secondary | ICD-10-CM

## 2023-11-26 DIAGNOSIS — N7682 Fournier disease of vagina and vulva: Secondary | ICD-10-CM | POA: Diagnosis present

## 2023-11-26 DIAGNOSIS — N319 Neuromuscular dysfunction of bladder, unspecified: Secondary | ICD-10-CM | POA: Diagnosis present

## 2023-11-26 DIAGNOSIS — Z87891 Personal history of nicotine dependence: Secondary | ICD-10-CM

## 2023-11-26 DIAGNOSIS — R031 Nonspecific low blood-pressure reading: Secondary | ICD-10-CM | POA: Diagnosis not present

## 2023-11-26 DIAGNOSIS — I2510 Atherosclerotic heart disease of native coronary artery without angina pectoris: Secondary | ICD-10-CM | POA: Diagnosis present

## 2023-11-26 DIAGNOSIS — Z860100 Personal history of colon polyps, unspecified: Secondary | ICD-10-CM

## 2023-11-26 DIAGNOSIS — I130 Hypertensive heart and chronic kidney disease with heart failure and stage 1 through stage 4 chronic kidney disease, or unspecified chronic kidney disease: Secondary | ICD-10-CM | POA: Diagnosis present

## 2023-11-26 DIAGNOSIS — L89153 Pressure ulcer of sacral region, stage 3: Secondary | ICD-10-CM | POA: Diagnosis present

## 2023-11-26 DIAGNOSIS — A419 Sepsis, unspecified organism: Secondary | ICD-10-CM | POA: Diagnosis present

## 2023-11-26 DIAGNOSIS — L03314 Cellulitis of groin: Secondary | ICD-10-CM | POA: Diagnosis present

## 2023-11-26 DIAGNOSIS — K219 Gastro-esophageal reflux disease without esophagitis: Secondary | ICD-10-CM | POA: Diagnosis present

## 2023-11-26 DIAGNOSIS — M4628 Osteomyelitis of vertebra, sacral and sacrococcygeal region: Secondary | ICD-10-CM | POA: Diagnosis present

## 2023-11-26 DIAGNOSIS — N179 Acute kidney failure, unspecified: Secondary | ICD-10-CM | POA: Diagnosis present

## 2023-11-26 DIAGNOSIS — M069 Rheumatoid arthritis, unspecified: Secondary | ICD-10-CM | POA: Diagnosis present

## 2023-11-26 DIAGNOSIS — G8220 Paraplegia, unspecified: Secondary | ICD-10-CM | POA: Diagnosis present

## 2023-11-26 DIAGNOSIS — F05 Delirium due to known physiological condition: Secondary | ICD-10-CM | POA: Diagnosis not present

## 2023-11-26 DIAGNOSIS — Z8249 Family history of ischemic heart disease and other diseases of the circulatory system: Secondary | ICD-10-CM

## 2023-11-26 DIAGNOSIS — I509 Heart failure, unspecified: Secondary | ICD-10-CM

## 2023-11-26 DIAGNOSIS — Z800 Family history of malignant neoplasm of digestive organs: Secondary | ICD-10-CM

## 2023-11-26 DIAGNOSIS — Z23 Encounter for immunization: Secondary | ICD-10-CM | POA: Diagnosis present

## 2023-11-26 DIAGNOSIS — E785 Hyperlipidemia, unspecified: Secondary | ICD-10-CM | POA: Diagnosis present

## 2023-11-26 DIAGNOSIS — L299 Pruritus, unspecified: Secondary | ICD-10-CM

## 2023-11-26 DIAGNOSIS — K5909 Other constipation: Secondary | ICD-10-CM | POA: Diagnosis present

## 2023-11-26 DIAGNOSIS — I110 Hypertensive heart disease with heart failure: Secondary | ICD-10-CM

## 2023-11-26 DIAGNOSIS — I10 Essential (primary) hypertension: Secondary | ICD-10-CM | POA: Diagnosis present

## 2023-11-26 DIAGNOSIS — K7460 Unspecified cirrhosis of liver: Secondary | ICD-10-CM | POA: Diagnosis present

## 2023-11-26 DIAGNOSIS — Z8505 Personal history of malignant neoplasm of liver: Secondary | ICD-10-CM

## 2023-11-26 DIAGNOSIS — Z9013 Acquired absence of bilateral breasts and nipples: Secondary | ICD-10-CM

## 2023-11-26 DIAGNOSIS — K592 Neurogenic bowel, not elsewhere classified: Secondary | ICD-10-CM | POA: Diagnosis present

## 2023-11-26 DIAGNOSIS — N764 Abscess of vulva: Secondary | ICD-10-CM

## 2023-11-26 DIAGNOSIS — T7840XD Allergy, unspecified, subsequent encounter: Secondary | ICD-10-CM

## 2023-11-26 DIAGNOSIS — Z89421 Acquired absence of other right toe(s): Secondary | ICD-10-CM

## 2023-11-26 DIAGNOSIS — L02215 Cutaneous abscess of perineum: Secondary | ICD-10-CM | POA: Diagnosis present

## 2023-11-26 DIAGNOSIS — D509 Iron deficiency anemia, unspecified: Secondary | ICD-10-CM | POA: Diagnosis present

## 2023-11-26 DIAGNOSIS — Z79899 Other long term (current) drug therapy: Secondary | ICD-10-CM

## 2023-11-26 DIAGNOSIS — I5032 Chronic diastolic (congestive) heart failure: Secondary | ICD-10-CM | POA: Diagnosis present

## 2023-11-26 DIAGNOSIS — K3184 Gastroparesis: Secondary | ICD-10-CM | POA: Diagnosis present

## 2023-11-26 DIAGNOSIS — Z7982 Long term (current) use of aspirin: Secondary | ICD-10-CM

## 2023-11-26 DIAGNOSIS — Z960 Presence of urogenital implants: Secondary | ICD-10-CM | POA: Diagnosis present

## 2023-11-26 DIAGNOSIS — K5900 Constipation, unspecified: Secondary | ICD-10-CM | POA: Diagnosis present

## 2023-11-26 DIAGNOSIS — Z823 Family history of stroke: Secondary | ICD-10-CM

## 2023-11-26 DIAGNOSIS — M869 Osteomyelitis, unspecified: Secondary | ICD-10-CM

## 2023-11-26 DIAGNOSIS — B9562 Methicillin resistant Staphylococcus aureus infection as the cause of diseases classified elsewhere: Secondary | ICD-10-CM | POA: Diagnosis present

## 2023-11-26 DIAGNOSIS — Z9359 Other cystostomy status: Secondary | ICD-10-CM

## 2023-11-26 HISTORY — PX: IRRIGATION AND DEBRIDEMENT ABSCESS: SHX5252

## 2023-11-26 LAB — COMPREHENSIVE METABOLIC PANEL WITH GFR
ALT: 32 U/L (ref 0–44)
AST: 44 U/L — ABNORMAL HIGH (ref 15–41)
Albumin: 2.5 g/dL — ABNORMAL LOW (ref 3.5–5.0)
Alkaline Phosphatase: 68 U/L (ref 38–126)
Anion gap: 11 (ref 5–15)
BUN: 26 mg/dL — ABNORMAL HIGH (ref 8–23)
CO2: 21 mmol/L — ABNORMAL LOW (ref 22–32)
Calcium: 9.2 mg/dL (ref 8.9–10.3)
Chloride: 99 mmol/L (ref 98–111)
Creatinine, Ser: 1.21 mg/dL — ABNORMAL HIGH (ref 0.44–1.00)
GFR, Estimated: 47 mL/min — ABNORMAL LOW (ref >60)
Glucose, Bld: 93 mg/dL (ref 70–99)
Potassium: 3.7 mmol/L (ref 3.5–5.1)
Sodium: 131 mmol/L — ABNORMAL LOW (ref 135–145)
Total Bilirubin: 0.7 mg/dL (ref 0.0–1.2)
Total Protein: 7.6 g/dL (ref 6.5–8.1)

## 2023-11-26 LAB — CBC WITH DIFFERENTIAL/PLATELET
Abs Immature Granulocytes: 0.06 10*3/uL (ref 0.00–0.07)
Basophils Absolute: 0 10*3/uL (ref 0.0–0.1)
Basophils Relative: 0 %
Eosinophils Absolute: 0.1 10*3/uL (ref 0.0–0.5)
Eosinophils Relative: 1 %
HCT: 30.8 % — ABNORMAL LOW (ref 36.0–46.0)
Hemoglobin: 9.9 g/dL — ABNORMAL LOW (ref 12.0–15.0)
Immature Granulocytes: 1 %
Lymphocytes Relative: 9 %
Lymphs Abs: 1 10*3/uL (ref 0.7–4.0)
MCH: 25.8 pg — ABNORMAL LOW (ref 26.0–34.0)
MCHC: 32.1 g/dL (ref 30.0–36.0)
MCV: 80.2 fL (ref 80.0–100.0)
Monocytes Absolute: 0.7 10*3/uL (ref 0.1–1.0)
Monocytes Relative: 6 %
Neutro Abs: 9.4 10*3/uL — ABNORMAL HIGH (ref 1.7–7.7)
Neutrophils Relative %: 83 %
Platelets: 262 10*3/uL (ref 150–400)
RBC: 3.84 MIL/uL — ABNORMAL LOW (ref 3.87–5.11)
RDW: 15.8 % — ABNORMAL HIGH (ref 11.5–15.5)
WBC: 11.3 10*3/uL — ABNORMAL HIGH (ref 4.0–10.5)
nRBC: 0 % (ref 0.0–0.2)

## 2023-11-26 LAB — SEDIMENTATION RATE: Sed Rate: 109 mm/h — ABNORMAL HIGH (ref 0–22)

## 2023-11-26 LAB — IRON AND TIBC
Iron: 20 ug/dL — ABNORMAL LOW (ref 28–170)
Saturation Ratios: 12 % (ref 10.4–31.8)
TIBC: 165 ug/dL — ABNORMAL LOW (ref 250–450)
UIBC: 145 ug/dL

## 2023-11-26 LAB — C-REACTIVE PROTEIN: CRP: 19.5 mg/dL — ABNORMAL HIGH (ref <1.0)

## 2023-11-26 LAB — FERRITIN: Ferritin: 569 ng/mL — ABNORMAL HIGH (ref 11–307)

## 2023-11-26 LAB — SURGICAL PCR SCREEN
MRSA, PCR: NEGATIVE
Staphylococcus aureus: POSITIVE — AB

## 2023-11-26 LAB — PROTIME-INR
INR: 1.2 (ref 0.8–1.2)
Prothrombin Time: 14.9 s (ref 11.4–15.2)

## 2023-11-26 LAB — I-STAT CG4 LACTIC ACID, ED: Lactic Acid, Venous: 1.2 mmol/L (ref 0.5–1.9)

## 2023-11-26 SURGERY — IRRIGATION AND DEBRIDEMENT ABSCESS
Anesthesia: Monitor Anesthesia Care

## 2023-11-26 MED ORDER — ONDANSETRON HCL 4 MG PO TABS
4.0000 mg | ORAL_TABLET | Freq: Four times a day (QID) | ORAL | Status: DC | PRN
  Administered 2023-11-26: 4 mg via ORAL
  Filled 2023-11-26: qty 1

## 2023-11-26 MED ORDER — LINACLOTIDE 145 MCG PO CAPS
290.0000 ug | ORAL_CAPSULE | Freq: Every day | ORAL | Status: DC
  Administered 2023-11-26 – 2023-12-02 (×7): 290 ug via ORAL
  Filled 2023-11-26 (×8): qty 2

## 2023-11-26 MED ORDER — ASPIRIN 81 MG PO TBEC
81.0000 mg | DELAYED_RELEASE_TABLET | Freq: Every day | ORAL | Status: DC
  Administered 2023-11-27 – 2023-12-03 (×7): 81 mg via ORAL
  Filled 2023-11-26 (×7): qty 1

## 2023-11-26 MED ORDER — MELATONIN 5 MG PO TABS
5.0000 mg | ORAL_TABLET | Freq: Every evening | ORAL | Status: DC | PRN
  Administered 2023-11-26 – 2023-12-02 (×3): 5 mg via ORAL
  Filled 2023-11-26 (×4): qty 1

## 2023-11-26 MED ORDER — ACETAMINOPHEN 650 MG RE SUPP: 650.0000 mg | Freq: Four times a day (QID) | RECTAL | Status: DC | PRN

## 2023-11-26 MED ORDER — POLYETHYLENE GLYCOL 3350 17 G PO PACK
17.0000 g | PACK | Freq: Two times a day (BID) | ORAL | Status: DC
  Administered 2023-11-26 – 2023-12-03 (×13): 17 g via ORAL
  Filled 2023-11-26 (×15): qty 1

## 2023-11-26 MED ORDER — SODIUM CHLORIDE 0.9 % IV SOLN
2.0000 g | Freq: Once | INTRAVENOUS | Status: DC
  Filled 2023-11-26: qty 12.5

## 2023-11-26 MED ORDER — CLINDAMYCIN PHOSPHATE 600 MG/50ML IV SOLN
600.0000 mg | Freq: Once | INTRAVENOUS | Status: AC
  Administered 2023-11-26: 600 mg via INTRAVENOUS
  Filled 2023-11-26: qty 50

## 2023-11-26 MED ORDER — HYDROXYCHLOROQUINE SULFATE 200 MG PO TABS
200.0000 mg | ORAL_TABLET | Freq: Every day | ORAL | Status: DC
  Administered 2023-11-26 – 2023-12-03 (×8): 200 mg via ORAL
  Filled 2023-11-26 (×8): qty 1

## 2023-11-26 MED ORDER — ONDANSETRON HCL 4 MG/2ML IJ SOLN
4.0000 mg | Freq: Four times a day (QID) | INTRAMUSCULAR | Status: DC | PRN
  Administered 2023-11-27 – 2023-12-02 (×2): 4 mg via INTRAVENOUS
  Filled 2023-11-26 (×3): qty 2

## 2023-11-26 MED ORDER — ACETAMINOPHEN 325 MG PO TABS
650.0000 mg | ORAL_TABLET | Freq: Four times a day (QID) | ORAL | Status: DC | PRN
  Administered 2023-11-26: 650 mg via ORAL
  Filled 2023-11-26 (×3): qty 2

## 2023-11-26 MED ORDER — PROPOFOL 10 MG/ML IV BOLUS
INTRAVENOUS | Status: DC | PRN
  Administered 2023-11-26 (×3): 30 mg via INTRAVENOUS

## 2023-11-26 MED ORDER — OXYCODONE HCL 5 MG PO TABS
5.0000 mg | ORAL_TABLET | ORAL | Status: DC | PRN
  Administered 2023-11-26 – 2023-12-01 (×5): 5 mg via ORAL
  Filled 2023-11-26 (×5): qty 1

## 2023-11-26 MED ORDER — LACTATED RINGERS IV BOLUS (SEPSIS)
1000.0000 mL | Freq: Once | INTRAVENOUS | Status: AC
  Administered 2023-11-26: 1000 mL via INTRAVENOUS

## 2023-11-26 MED ORDER — POLYETHYLENE GLYCOL 3350 17 G PO PACK: 17.0000 g | PACK | Freq: Every day | ORAL | Status: DC | PRN

## 2023-11-26 MED ORDER — LOSARTAN POTASSIUM 50 MG PO TABS
25.0000 mg | ORAL_TABLET | Freq: Every day | ORAL | Status: DC
  Administered 2023-11-28 – 2023-12-02 (×5): 25 mg via ORAL
  Filled 2023-11-26 (×6): qty 1

## 2023-11-26 MED ORDER — PREDNISOLONE ACETATE 1 % OP SUSP
1.0000 [drp] | Freq: Three times a day (TID) | OPHTHALMIC | Status: DC
  Administered 2023-11-26 – 2023-12-03 (×21): 1 [drp] via OPHTHALMIC
  Filled 2023-11-26: qty 5

## 2023-11-26 MED ORDER — ORAL CARE MOUTH RINSE: 15.0000 mL | Freq: Once | OROMUCOSAL | Status: AC

## 2023-11-26 MED ORDER — PANTOPRAZOLE SODIUM 40 MG PO TBEC
40.0000 mg | DELAYED_RELEASE_TABLET | Freq: Every day | ORAL | Status: DC
  Administered 2023-11-26 – 2023-12-03 (×8): 40 mg via ORAL
  Filled 2023-11-26 (×8): qty 1

## 2023-11-26 MED ORDER — CHLORHEXIDINE GLUCONATE 0.12 % MT SOLN: 15.0000 mL | Freq: Once | OROMUCOSAL | Status: AC

## 2023-11-26 MED ORDER — ATORVASTATIN CALCIUM 40 MG PO TABS
40.0000 mg | ORAL_TABLET | Freq: Every day | ORAL | Status: DC
  Administered 2023-11-26 – 2023-12-02 (×7): 40 mg via ORAL
  Filled 2023-11-26 (×7): qty 1

## 2023-11-26 MED ORDER — POLYVINYL ALCOHOL 1.4 % OP SOLN: 2.0000 [drp] | OPHTHALMIC | Status: DC | PRN

## 2023-11-26 MED ORDER — LACTATED RINGERS IV SOLN
INTRAVENOUS | Status: AC
Start: 2023-11-26 — End: 2023-11-27

## 2023-11-26 MED ORDER — BISACODYL 10 MG RE SUPP
10.0000 mg | RECTAL | Status: DC
Start: 2023-11-27 — End: 2023-12-03
  Administered 2023-11-27 – 2023-12-02 (×4): 10 mg via RECTAL
  Filled 2023-11-26 (×4): qty 1

## 2023-11-26 MED ORDER — TIZANIDINE HCL 4 MG PO TABS
6.0000 mg | ORAL_TABLET | Freq: Three times a day (TID) | ORAL | Status: DC
  Administered 2023-11-26 – 2023-12-03 (×20): 6 mg via ORAL
  Filled 2023-11-26 (×21): qty 2

## 2023-11-26 MED ORDER — PNEUMOCOCCAL 20-VAL CONJ VACC 0.5 ML IM SUSY
0.5000 mL | PREFILLED_SYRINGE | INTRAMUSCULAR | Status: AC
  Administered 2023-11-27: 0.5 mL via INTRAMUSCULAR
  Filled 2023-11-26 (×2): qty 0.5

## 2023-11-26 MED ORDER — BACLOFEN 10 MG PO TABS
10.0000 mg | ORAL_TABLET | Freq: Three times a day (TID) | ORAL | Status: DC
  Administered 2023-11-26 – 2023-12-03 (×21): 10 mg via ORAL
  Filled 2023-11-26 (×21): qty 1

## 2023-11-26 MED ORDER — PROPYLENE GLYCOL 0.6 % OP SOLN: Freq: Four times a day (QID) | OPHTHALMIC | Status: DC | PRN

## 2023-11-26 MED ORDER — PROPOFOL 500 MG/50ML IV EMUL
INTRAVENOUS | Status: DC | PRN
  Administered 2023-11-26: 100 ug/kg/min via INTRAVENOUS
  Administered 2023-11-26: 50 ug/kg/min via INTRAVENOUS

## 2023-11-26 MED ORDER — LINEZOLID 600 MG/300ML IV SOLN: 600.0000 mg | Freq: Two times a day (BID) | INTRAVENOUS | Status: DC

## 2023-11-26 MED ORDER — ONDANSETRON HCL 4 MG/2ML IJ SOLN: 4.0000 mg | Freq: Four times a day (QID) | INTRAMUSCULAR | Status: DC | PRN

## 2023-11-26 MED ORDER — 0.9 % SODIUM CHLORIDE (POUR BTL) OPTIME
TOPICAL | Status: DC | PRN
  Administered 2023-11-26: 1000 mL

## 2023-11-26 MED ORDER — OXYCODONE HCL 5 MG/5ML PO SOLN: 5.0000 mg | Freq: Once | ORAL | Status: DC | PRN

## 2023-11-26 MED ORDER — MORPHINE SULFATE (PF) 2 MG/ML IV SOLN
1.0000 mg | INTRAVENOUS | Status: DC | PRN
  Filled 2023-11-26: qty 1

## 2023-11-26 MED ORDER — FERROUS GLUCONATE 324 (38 FE) MG PO TABS
324.0000 mg | ORAL_TABLET | Freq: Every day | ORAL | Status: DC
  Administered 2023-11-27 – 2023-12-03 (×7): 324 mg via ORAL
  Filled 2023-11-26 (×7): qty 1

## 2023-11-26 MED ORDER — CARVEDILOL 12.5 MG PO TABS
12.5000 mg | ORAL_TABLET | Freq: Two times a day (BID) | ORAL | Status: DC
  Administered 2023-11-26 – 2023-12-03 (×14): 12.5 mg via ORAL
  Filled 2023-11-26 (×14): qty 1

## 2023-11-26 MED ORDER — CHLORHEXIDINE GLUCONATE 0.12 % MT SOLN
OROMUCOSAL | Status: AC
Start: 2023-11-26 — End: 2023-11-26
  Administered 2023-11-26: 15 mL via OROMUCOSAL
  Filled 2023-11-26: qty 15

## 2023-11-26 MED ORDER — GLYCOPYRROLATE 0.2 MG/ML IJ SOLN
INTRAMUSCULAR | Status: DC | PRN
  Administered 2023-11-26: .2 mg via INTRAVENOUS

## 2023-11-26 MED ORDER — LINEZOLID 600 MG/300ML IV SOLN
600.0000 mg | Freq: Two times a day (BID) | INTRAVENOUS | Status: DC
  Administered 2023-11-26 – 2023-11-28 (×5): 600 mg via INTRAVENOUS
  Filled 2023-11-26 (×6): qty 300

## 2023-11-26 MED ORDER — LACTATED RINGERS IV SOLN: INTRAVENOUS | Status: DC

## 2023-11-26 MED ORDER — VITAMIN D 25 MCG (1000 UNIT) PO TABS
1000.0000 [IU] | ORAL_TABLET | Freq: Every day | ORAL | Status: DC
  Administered 2023-11-26 – 2023-12-03 (×8): 1000 [IU] via ORAL
  Filled 2023-11-26 (×8): qty 1

## 2023-11-26 MED ORDER — SODIUM CHLORIDE 0.9 % IV SOLN
2.0000 g | Freq: Two times a day (BID) | INTRAVENOUS | Status: DC
  Administered 2023-11-26 – 2023-11-28 (×5): 2 g via INTRAVENOUS
  Filled 2023-11-26 (×5): qty 12.5

## 2023-11-26 MED ORDER — OYSTER SHELL CALCIUM/D3 500-5 MG-MCG PO TABS
1.0000 | ORAL_TABLET | Freq: Two times a day (BID) | ORAL | Status: DC
  Administered 2023-11-26 – 2023-12-03 (×15): 1 via ORAL
  Filled 2023-11-26 (×16): qty 1

## 2023-11-26 MED ORDER — NALOXONE HCL 0.4 MG/ML IJ SOLN: 0.4000 mg | INTRAMUSCULAR | Status: DC | PRN

## 2023-11-26 MED ORDER — PHENYLEPHRINE HCL-NACL 20-0.9 MG/250ML-% IV SOLN
INTRAVENOUS | Status: DC | PRN
  Administered 2023-11-26: 25 ug/min via INTRAVENOUS

## 2023-11-26 MED ORDER — OXYCODONE HCL 5 MG PO TABS: 5.0000 mg | ORAL_TABLET | Freq: Once | ORAL | Status: DC | PRN

## 2023-11-26 MED ORDER — MUPIROCIN 2 % EX OINT
1.0000 | TOPICAL_OINTMENT | Freq: Two times a day (BID) | CUTANEOUS | Status: AC
  Administered 2023-11-26 – 2023-11-30 (×10): 1 via NASAL
  Filled 2023-11-26 (×3): qty 22

## 2023-11-26 MED ORDER — IOHEXOL 350 MG/ML SOLN
75.0000 mL | Freq: Once | INTRAVENOUS | Status: AC | PRN
  Administered 2023-11-26: 75 mL via INTRAVENOUS

## 2023-11-26 MED ORDER — FENTANYL CITRATE (PF) 100 MCG/2ML IJ SOLN: 25.0000 ug | INTRAMUSCULAR | Status: DC | PRN

## 2023-11-26 MED ORDER — BISACODYL 10 MG RE SUPP
10.0000 mg | Freq: Once | RECTAL | Status: AC
  Administered 2023-11-27: 10 mg via RECTAL
  Filled 2023-11-26: qty 1

## 2023-11-26 SURGICAL SUPPLY — 20 items
BLADE CLIPPER SURG (BLADE) IMPLANT
BNDG GAUZE DERMACEA FLUFF 4 (GAUZE/BANDAGES/DRESSINGS) IMPLANT
BNDG STRETCH GAUZE 3IN X12FT (GAUZE/BANDAGES/DRESSINGS) IMPLANT
COVER SURGICAL LIGHT HANDLE (MISCELLANEOUS) IMPLANT
DRAPE LAPAROTOMY 100X72X124 (DRAPES) IMPLANT
ELECTRODE REM PT RTRN 9FT ADLT (ELECTROSURGICAL) IMPLANT
GAUZE PAD ABD 8X10 STRL (GAUZE/BANDAGES/DRESSINGS) IMPLANT
GAUZE SPONGE 4X4 12PLY STRL (GAUZE/BANDAGES/DRESSINGS) IMPLANT
GLOVE BIO SURGEON STRL SZ 6 (GLOVE) IMPLANT
GLOVE INDICATOR 6.5 STRL GRN (GLOVE) IMPLANT
GOWN STRL REUS W/ TWL LRG LVL3 (GOWN DISPOSABLE) IMPLANT
KIT BASIN OR (CUSTOM PROCEDURE TRAY) IMPLANT
KIT TURNOVER KIT A (KITS) IMPLANT
NS IRRIG 1000ML POUR BTL (IV SOLUTION) IMPLANT
PACK ABDOMINAL MINOR (CUSTOM PROCEDURE TRAY) IMPLANT
PAD ABD 8X10 STRL (GAUZE/BANDAGES/DRESSINGS) IMPLANT
PAD ARMBOARD POSITIONER FOAM (MISCELLANEOUS) IMPLANT
SWAB COLLECTION DEVICE MRSA (MISCELLANEOUS) IMPLANT
TOWEL GREEN STERILE (TOWEL DISPOSABLE) IMPLANT
TOWEL GREEN STERILE FF (TOWEL DISPOSABLE) IMPLANT

## 2023-11-26 NOTE — Transfer of Care (Signed)
 Immediate Anesthesia Transfer of Care Note  Patient: [name]  Procedure(s) Performed: IRRIGATION AND DEBRIDEMENT ABSCESS  Patient Location: PACU  Anesthesia Type:MAC  Level of Consciousness: awake, alert , and oriented  Airway & Oxygen Therapy: Patient Spontanous Breathing and Patient connected to nasal cannula oxygen  Post-op Assessment: Report given to RN and Post -op Vital signs reviewed and stable  Post vital signs: Reviewed and stable  Last Vitals:  Vitals Value Taken Time  BP 97/65   Temp 98   Pulse 80 11/26/23 1913  Resp 25 11/26/23 1913  SpO2 99 % 11/26/23 1913  Vitals shown include unfiled device data.  Last Pain:  Vitals:   11/26/23 1731  TempSrc: Oral  PainSc: 0-No pain         Complications: No notable events documented.

## 2023-11-26 NOTE — Consult Note (Addendum)
 WOC Nurse Consult Note: this patient has been evaluated by surgical PA for labial wound present for years per patient; plans to take patient to OR for debridement  Also has a chronic sacral/back wound with debridement dating back to 2010 Reason for Consult: 1. labial and groin wound  2.  ? Stage 3 pressure injury sacrum Wound type: 1. full thickness labia/groin of unknown etiology present for years per patient  2.  Stage 3 Pressure Injury sacrum  Pressure Injury POA: yes, sacral wound Measurement: see nursing flowsheet per Sharen Daubs note R labial and groin wound with underlying infection R labial abscess vs phlegmon 5.2x2.9 cm Wound bed:1.  Labial wound 80% black tan necrotic tissue 20% red moist  2.  Sacral wound largely clean  Drainage (amount, consistency, odor) per nursing flowsheet; foul smelling odor per PA note  Periwound: edema Dressing procedure/placement/frequency:  Cleanse labia/groin wound with Vashe wound cleanser Timm Foot [phone]), do not rinse and allow to air dry. Apply Vashe moistened gauze to wound bed daily, cover with ABD pad and secure with mesh underwear.   Cleanse sacral wound with Vashe, do not rinse and allow to air dry. Apply silver hydrofber ([place] [phone] Aquacel AG) to wound bed daily, cover with silicone foam or ABd pad whichever is preferred.   Plan is for patient to go to OR for debridement of labial wound.  Any wound care orders placed by surgery supercedes wound care orders placed by [facility] nurse.   Thank you,    Ronni Colace MSN, RN-BC, [employer] [phone]

## 2023-11-26 NOTE — Op Note (Addendum)
   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  Complications: None  Drains:  none   Specimen:  ID Type Source Tests Collected by Time Destination  A : Perneal Wound Culture Wound Wound AEROBIC CULTURE W GRAM STAIN (SUPERFICIAL SPECIMEN) Coy Rochford, Avon Boers, MD 11/26/2023 1848   B : Bone Culture Tissue Bone AEROBIC/ANAEROBIC CULTURE W Hillis Lu STAIN (SURGICAL/DEEP WOUND) Ruby Dilone, Avon Boers, MD 11/26/2023 1850   C : Tissue culture Tissue Soft Tissue, Other AEROBIC/ANAEROBIC CULTURE W Hillis Lu STAIN (SURGICAL/DEEP WOUND) Bartlett Enke, Avon Boers, MD 11/26/2023 [phone]      Disposition:  PACU - hemodynamically stable.  Plan of Care:  Continue inpatient care    Indications for Procedure: [name] is a 73 y.o. female who presented with a right ischial tuberosity pressure wound with concern for necrotizing infection on CT with gas in the soft tissues.  Recommending incision and drainage and debridement of this area.  The procedure itself as well as its risks, benefits and alternatives were discussed.  The risks discussed included but were not limited to the risk of infection, bleeding, damage to nearby structures, and possible need for return to the operating room.  After a full discussion and all questions answered the patient granted  consent to proceed.  Findings: Right ischial pressure wound with infected and necrotic skin, subcutaneous tissues, and bone, all sent for culture.  Purulent drainage sent for culture.    Description of Procedure:   On the date stated above the patient taken operating suite.  Anesthesia was induced.  The patient was position will left lateral decubitus.  The perineal and labial region was prepped and draped in usual sterile fashion.  Antibiotics were given prior to case started on a scheduled basis.  I used a scalpel and electrocautery to sharply debride and excise tissue from this necrotic wound valving the right labia and perineum.  I removed skin, subcutaneous tissue, and bone as the infection appeared to involve the right ischial tuberosity.  Purulent drainage was expressed from the wound and sent for culture.  Hemostasis was obtained with the electrocautery and the wound was packed using Kerlix.  The total wound area measured to be 11 cm anterior/posterior, 8 cm lateral and 4 cm deep.  Patient tolerated the procedure and was transferred to the postoperative care unit in stable condition.  All sponge needle counts were correct at the end of this case.   At the end of the case we reviewed the infection status of the case. Patient: [name] Emergency General Surgery Service Patient Case: Emergent Infection Present At Time Of Surgery (PATOS):  Dirty wound  [name] Favre, MD General, Bariatric, & Minimally Invasive Surgery [facility], [place]

## 2023-11-26 NOTE — TOC Initial Note (Addendum)
 Transition of Care [facility]) - Initial/Assessment Note    Patient Details  Name: [name] MRN: [id number] Date of Birth: 1950-08-22  Transition of Care [facility]) CM/SW Contact:    Juliane Och, LCSW Phone Number: 11/26/2023, 3:46 PM  Clinical Narrative:                  3:46 PM CSW introduced self and role to patient at bedside. Patient's sister and god son were also present at bedside. Patient consented CSW to speak in front of family members. CSW followed up on [facility] consult (SNF and concerns regarding return to [facility] LTC). Patient expressed concerns regarding returning to [facility] LTC due to lack of adequate medical care provided to her despite relayed concerns to SNF administration. Patient stated that she does not feel safe returning to [facility] LTC as she does not receive appropriate medical care. Patient stated that staff have avoided Patient expressed interest in LTC SNFs in [place] area and stated that she has been on LTC waitlist at [facility] for five years. Patient stated her preference would be to discharge to [facility] but identified [facility] as second choice. Patient stated that she is not interest in LTC at [facility]. CSW contacted [facility] and left a voicemail with [facility] admissions. [facility] confirmed that they have LTC beds. Patient CSW attempted to make an APS report regarding SNF concerns but there was no response and a voicemail was left. Patient informed CSW that she has DME Glass blower/designer wheelchair) at [facility] LTC.  4:10 PM CSW completed APS intake report regarding SNF.   4:45 PM [facility] SNF informed CSW that they have LTC bed availability.  Expected Discharge Plan: Skilled Nursing Facility Barriers to Discharge: Continued Medical Work up, SNF Pending bed offer   Patient Goals and CMS Choice Patient states their goals for this hospitalization and ongoing recovery are:: LTC SNF           Expected Discharge Plan and Services In-house Referral: Clinical Social Work   Post Acute Care Choice: Skilled Nursing Facility Living arrangements for the past 2 months: Skilled Nursing Facility                                      Prior Living Arrangements/Services Living arrangements for the past 2 months: Skilled Nursing Facility Lives with:: Facility Resident Patient language and need for interpreter reviewed:: Yes Do you feel safe going back to the place where you live?: No   lack of appropriate medical care  Need for Family Participation in Patient Care: No (Comment)     Criminal Activity/Legal Involvement Pertinent to Current Situation/Hospitalization: No - Comment as needed  Activities of Daily Living   ADL Screening (condition at time of admission) Independently performs ADLs?: No Does the patient have a NEW difficulty with bathing/dressing/toileting/self-feeding that is expected to last >3 days?: No Does the patient have a NEW difficulty with getting in/out of bed, walking, or climbing stairs that is expected to last >3 days?: No Does the patient have a NEW difficulty with communication that is expected to last >3 days?: No Is the patient deaf or have difficulty hearing?: No Does the patient have difficulty seeing, even when wearing glasses/contacts?: No Does the patient have difficulty concentrating, remembering, or making decisions?: No  Permission Sought/Granted Permission sought to share information with : Family Supports,  Facility Industrial/product designer granted to share information with : Yes, Verbal Permission Granted  Share Information with NAME: Reyes Caul  Permission granted to share info w AGENCY: SNF LTC  Permission granted to share info w Relationship: Sister  Permission granted to share info w Contact Information: [phone]  Emotional Assessment Appearance:: Appears stated age Attitude/Demeanor/Rapport: Engaged Affect  (typically observed): Accepting, Appropriate, Adaptable, Calm, Stable, Pleasant Orientation: : Oriented to Situation, Oriented to  Time, Oriented to Place, Oriented to Self Alcohol  / Substance Use: Not Applicable Psych Involvement: No (comment)  Admission diagnosis:  Labial abscess [N76.4] Osteomyelitis, unspecified site, unspecified type [facility]) [M86.9] Patient Active Problem List   Diagnosis Date Noted   Labial abscess 11/26/2023   Dyslipidemia 10/24/2023   Myofascial pain 01/11/2023   Non-healing open wound of left heel 01/11/2023   Memory loss 12/14/2022   Other fatigue 12/14/2022   Carpal tunnel syndrome 06/22/2022   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   Suprapubic catheter (HCC) 09/12/2021   Acute pyelonephritis 09/12/2021   Complicated UTI (urinary tract infection) 09/11/2021   Cellulitis 09/11/2021   AKI (acute kidney injury) (HCC)    Bacteremia due to Klebsiella pneumoniae    UTI (urinary tract infection) 07/23/2021   Laryngopharyngeal reflux 05/20/2021   Dry gangrene (HCC) 04/10/2021   Osteomyelitis (HCC) 04/09/2021   Hiatal hernia with GERD 02/07/2021   Hepatocellular carcinoma (HCC) 02/07/2021   Sepsis secondary to UTI (HCC) 12/08/2020   Spasticity 12/06/2020   Contracture of lower leg joint 12/06/2020   Autonomic dysfunction 08/30/2020   Neurogenic bowel 08/30/2020   Wheelchair dependence 08/30/2020   Medication management 06/07/2018   Chronic cough 06/07/2018   Cirrhosis (HCC) 04/20/2018   Obstipation/fecal impaction 02/12/2018   Intractable vomiting 02/11/2018   Chronic hepatitis C without hepatic coma (HCC) 01/10/2018   NICM (nonischemic cardiomyopathy) (HCC) 08/25/2017   Normal coronary arteries 08/25/2017   Diastolic dysfunction 08/25/2017   Chronic combined systolic and diastolic CHF (congestive heart failure) (HCC) 08/07/2017   Non-rheumatic mitral regurgitation 08/07/2017   S/P thoracentesis    Status post thoracentesis    S/p  percutaneous right heart catheterization    Recurrent right pleural effusion 06/29/2017   Respiratory failure with hypoxia (HCC) 06/29/2017   Pressure injury of skin 06/29/2017   Shortness of breath    Hypokalemia 09/21/2016   Fever in adult 07/28/2016   Decubitus ulcer of coccyx 02/04/2016   Hyperglycemia 12/03/2015   H/O bilateral mastectomy 07/25/2015   Benign hypertensive heart disease without heart failure 07/15/2015   Hiatal hernia 07/06/2015   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   History of breast cancer 08/21/2014   Adynamic ileus (HCC) 07/22/2014   Elevated liver enzymes 07/22/2014   CHF (congestive heart failure) (HCC) 07/05/2014   Gastroparesis 07/05/2014   Iron  deficiency anemia 01/05/2014   Insomnia 07/14/2013   Edema 05/01/2013   Neurogenic bladder 02/23/2013   Paraplegia (HCC) 02/23/2013   Depression 02/23/2013   Allergic rhinitis 02/23/2013   Constipation 02/23/2013   Autoimmune disease (HCC) 10/01/2011   Gastroesophageal reflux disease 10/01/2011   Essential hypertension 10/01/2011   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 [place] [address] Phone: [phone] Fax: [phone]  [facility] [address]  [address] [place] [address] Phone: [phone] Fax: [phone]     Social Drivers of Health (SDOH) Social History: SDOH Screenings   Food Insecurity: No Food Insecurity (11/26/2023)  Housing: Low Risk  (11/26/2023)  Transportation Needs: No Transportation Needs (11/26/2023)  Utilities: Not At Risk (11/26/2023)  Depression (PHQ2-9): Low Risk  (10/29/2023)  Social Connections: Moderately Isolated (11/26/2023)  Stress: No Stress Concern Present (07/23/2023)    Received from [facility]  Tobacco Use: Medium Risk (11/26/2023)   SDOH Interventions:     Readmission Risk Interventions     No data to display

## 2023-11-26 NOTE — Plan of Care (Signed)

## 2023-11-26 NOTE — Progress Notes (Signed)
 Patient refused to answer orientation questions, she told us  to leave her alone, she wanted to go back to her room and she continuously chanted  "I am cold" as we gave her several warm blankets several times.  When we arrived back in room with her family she reported that "staff was rude to her."

## 2023-11-26 NOTE — ED Notes (Signed)
 Surgical team at bedside, assessing sacral and labia wounds. Pt had small stool, pt cleaned, new linens changed, new wound dressing placed by surgical PA

## 2023-11-26 NOTE — Anesthesia Preprocedure Evaluation (Signed)
 Anesthesia Evaluation  Patient identified by MRN, date of birth, ID band Patient awake    Reviewed: Allergy & Precautions, H&P , NPO status , Patient's Chart, lab work & pertinent test results  Airway Mallampati: II   Neck ROM: full    Dental   Pulmonary shortness of breath, former smoker   breath sounds clear to auscultation       Cardiovascular hypertension, + CAD and +CHF   Rhythm:regular Rate:Normal     Neuro/Psych  PSYCHIATRIC DISORDERS  Depression    T7 paraplegic.  No sensation or motor below T7.  Neuromuscular disease    GI/Hepatic hiatal hernia,GERD  ,,(+) Hepatitis -, C  Endo/Other    Renal/GU Renal InsufficiencyRenal disease     Musculoskeletal  (+) Arthritis ,    Abdominal   Peds  Hematology   Anesthesia Other Findings   Reproductive/Obstetrics                             Anesthesia Physical Anesthesia Plan  ASA: 3  Anesthesia Plan: MAC   Post-op Pain Management:    Induction: Intravenous  PONV Risk Score and Plan: 2 and Propofol  infusion and Treatment may vary due to age or medical condition  Airway Management Planned: Simple Face Mask  Additional Equipment:   Intra-op Plan:   Post-operative Plan:   Informed Consent: I have reviewed the patients History and Physical, chart, labs and discussed the procedure including the risks, benefits and alternatives for the proposed anesthesia with the patient or authorized representative who has indicated his/her understanding and acceptance.     Dental advisory given  Plan Discussed with: CRNA, Anesthesiologist and Surgeon  Anesthesia Plan Comments:        Anesthesia Quick Evaluation
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
## 2023-11-26 NOTE — ED Triage Notes (Signed)
 Pt brought in by [facility] from [facility]. Pt has been having dizziness and not been feeling well with soft Bps. Pt has been belching and is easily nauseated. Pt was insctructed to come to ER if she was not feeling better.

## 2023-11-26 NOTE — Hospital Course (Addendum)
 [name] is a 73 y.o. 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 She saw her wound care physician on 4/29 where it was noted that the wound had worsened, at that time the wound was partially excised and she was prescribed clindamycin  outpatient of which she reports she has taken 1-2 doses due to a delay in getting the medication at her facility. Yesterday she reports significant fatigue with nausea and dizziness accompanied by decreased appetite, feelings of thirst, and chills. She denies fever or emesis.She has also noticed increased abdominal distention that she attributes to constipation. Per [name] RN Last bowel movement 11/25/23. Also of note patient reports being told by staff at her facility that she had blood in her stool. She does have a history of hemorrhoids. Family at bedside also reports a darkening in the undertone of the skin on her face and hands after breaking out in hives about a month ago, the patient nor family have been able to identify the trigger of the hives. Denies jaundice or scleral icterus.    11/26/23: Admit, gen surg plans to take to OR for debridement

## 2023-11-26 NOTE — Progress Notes (Signed)
   11/26/23 1228  TOC Brief Assessment  Insurance and Status Reviewed [facility] Medicare Dual Complete)  Patient has primary care physician Yes (Provider at [facility])  [facility]  Prior level of function: Paraplegia followibng spinal cord injury  Prior/Current Home Services No current home services  Social Drivers of Health Review SDOH reviewed no interventions necessary  Readmission risk has been reviewed Yes (26%)  Transition of care needs transition of care needs identified, TOC will continue to follow (Will need to return to [facility]   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 Patient has several wounds and multiple co morbidities. TOC will continue to follow patient for any  discharge needs

## 2023-11-26 NOTE — Progress Notes (Signed)
 Patient seen in preoperative area.  Case discussed with Dr. Lanell Pinta.  Reviewed CT scan.  Recommend debridement in the OR of the labial/perineal abscess.  Discussed risks, benefits and alternatives and patient granted consent to proceed.  Will proceed once time is available.  [name] Olds, MD General, Bariatric and Minimally Invasive Surgery [facility]

## 2023-11-26 NOTE — ED Provider Notes (Signed)
  Physical Exam  BP 92/62   Pulse 83   Temp 98.9 F (37.2 C) (Oral)   Resp 18   Ht 5\' 6"  (1.676 m)   Wt 74.8 kg   SpO2 95%   BMI 26.63 kg/m   Physical Exam Vitals and nursing note reviewed.  Constitutional:      General: She is not in acute distress.    Appearance: She is well-developed.  HENT:     Head: Normocephalic and atraumatic.  Eyes:     Conjunctiva/sclera: Conjunctivae normal.  Cardiovascular:     Rate and Rhythm: Normal rate and regular rhythm.     Heart sounds: No murmur heard. Pulmonary:     Effort: Pulmonary effort is normal. No respiratory distress.     Breath sounds: Normal breath sounds.  Abdominal:     Palpations: Abdomen is soft.     Tenderness: There is no abdominal tenderness.  Musculoskeletal:        General: No swelling.     Cervical back: Neck supple.  Skin:    General: Skin is warm and dry.     Capillary Refill: Capillary refill takes less than 2 seconds.     Findings: Lesion present.  Neurological:     Mental Status: She is alert.  Psychiatric:        Mood and Affect: Mood normal.     Procedures  .Critical Care  Performed by: Karlyn Overman, MD Authorized by: Karlyn Overman, MD   Critical care provider statement:    Critical care time (minutes):  30   Critical care was time spent personally by me on the following activities:  Development of treatment plan with patient or surrogate, discussions with consultants, evaluation of patient's response to treatment, examination of patient, ordering and review of laboratory studies, ordering and review of radiographic studies, ordering and performing treatments and interventions, pulse oximetry, re-evaluation of patient's condition and review of old charts   ED Course / MDM    Medical Decision Making Amount and/or Complexity of Data Reviewed Labs: ordered. Radiology: ordered.  Risk Prescription drug management. Decision regarding hospitalization.   Patient received in handoff.  Groin  wound and concern for soft tissue gas infection.  Patient started on Zyvox  and cefepime  given previous history of VRE.  Spoke with multiple consultants including general surgery physician assistant for Dr. Andy Bannister, urology physician assistant as well as Dr. Ozan of OB/GYN and they will coordinate surgical intervention while inpatient.  Patient will require hospital admission.  Patient admitted       Karlyn Overman, MD 11/26/23 1006
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
## 2023-11-27 DIAGNOSIS — N764 Abscess of vulva: Secondary | ICD-10-CM | POA: Diagnosis not present

## 2023-11-27 LAB — CBC
HCT: 27.6 % — ABNORMAL LOW (ref 36.0–46.0)
Hemoglobin: 8.8 g/dL — ABNORMAL LOW (ref 12.0–15.0)
MCH: 25.1 pg — ABNORMAL LOW (ref 26.0–34.0)
MCHC: 31.9 g/dL (ref 30.0–36.0)
MCV: 78.9 fL — ABNORMAL LOW (ref 80.0–100.0)
Platelets: 229 10*3/uL (ref 150–400)
RBC: 3.5 MIL/uL — ABNORMAL LOW (ref 3.87–5.11)
RDW: 15.9 % — ABNORMAL HIGH (ref 11.5–15.5)
WBC: 14.6 10*3/uL — ABNORMAL HIGH (ref 4.0–10.5)
nRBC: 0 % (ref 0.0–0.2)

## 2023-11-27 LAB — COMPREHENSIVE METABOLIC PANEL WITH GFR
ALT: 43 U/L (ref 0–44)
AST: 41 U/L (ref 15–41)
Albumin: 2.1 g/dL — ABNORMAL LOW (ref 3.5–5.0)
Alkaline Phosphatase: 70 U/L (ref 38–126)
Anion gap: 9 (ref 5–15)
BUN: 16 mg/dL (ref 8–23)
CO2: 20 mmol/L — ABNORMAL LOW (ref 22–32)
Calcium: 8.6 mg/dL — ABNORMAL LOW (ref 8.9–10.3)
Chloride: 101 mmol/L (ref 98–111)
Creatinine, Ser: 0.97 mg/dL (ref 0.44–1.00)
GFR, Estimated: >60 mL/min (ref >60)
Glucose, Bld: 112 mg/dL — ABNORMAL HIGH (ref 70–99)
Potassium: 3.5 mmol/L (ref 3.5–5.1)
Sodium: 130 mmol/L — ABNORMAL LOW (ref 135–145)
Total Bilirubin: 1.3 mg/dL — ABNORMAL HIGH (ref 0.0–1.2)
Total Protein: 6.1 g/dL — ABNORMAL LOW (ref 6.5–8.1)

## 2023-11-27 MED ORDER — HYDROXYZINE HCL 25 MG PO TABS
25.0000 mg | ORAL_TABLET | Freq: Three times a day (TID) | ORAL | Status: DC | PRN
  Administered 2023-11-27: 25 mg via ORAL
  Filled 2023-11-27 (×2): qty 1

## 2023-11-27 MED ORDER — SIMETHICONE 80 MG PO CHEW
80.0000 mg | CHEWABLE_TABLET | Freq: Once | ORAL | Status: AC
  Administered 2023-11-27: 80 mg via ORAL
  Filled 2023-11-27: qty 1

## 2023-11-27 MED ORDER — SUCRALFATE 1 G PO TABS
1.0000 g | ORAL_TABLET | Freq: Four times a day (QID) | ORAL | Status: DC | PRN
  Administered 2023-11-27 – 2023-12-01 (×4): 1 g via ORAL
  Filled 2023-11-27 (×4): qty 1

## 2023-11-27 MED ORDER — SENNOSIDES-DOCUSATE SODIUM 8.6-50 MG PO TABS
1.0000 | ORAL_TABLET | Freq: Two times a day (BID) | ORAL | Status: DC
  Administered 2023-11-27 – 2023-12-03 (×11): 1 via ORAL
  Filled 2023-11-27 (×12): qty 1

## 2023-11-27 MED ORDER — LACTATED RINGERS IV BOLUS
1000.0000 mL | Freq: Once | INTRAVENOUS | Status: AC
  Administered 2023-11-27: 1000 mL via INTRAVENOUS

## 2023-11-27 MED ORDER — LIDOCAINE 5 % EX PTCH
1.0000 | MEDICATED_PATCH | CUTANEOUS | Status: DC
  Administered 2023-11-27 – 2023-12-01 (×4): 1 via TRANSDERMAL
  Filled 2023-11-27 (×5): qty 1

## 2023-11-27 NOTE — Progress Notes (Signed)
 PROGRESS NOTE    [name]  LKG:[id number] DOB: 1951-03-25 DOA: 11/26/2023 PCP: System, Provider Not In   Brief Narrative:  [name] is a 73 y.o. 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   She presents with worsening labial wound/abscess that is well documented and chronic - unfortunately has been more painful. Recently partially excised 11/23/23 and placed on clindamycin  with worsening systemic features of weakness/malaise, nausea, dizziness and poor appetite.   Assessment & Plan:   Principal Problem:   Labial abscess Active Problems:   Essential hypertension   Constipation   Iron  deficiency anemia   CHF (congestive heart failure) (HCC)   Neurogenic bowel   AKI (acute kidney injury) (HCC)   Suprapubic catheter (HCC)   Dyslipidemia   Sepsis secondary to labial/Groin Cellulitus POA Concurrent (R) Labial Abscess vs Phlegmon - Continue Cefepime  and Linezolid  - Follow up blood cultures - General Surgery following - 11/26/23 sharp debridement of labial perineal necrotizing infection with signs of osteomyelitis  - Wound Care Consult - Will discuss case with ID once cultures have resulted   Acute Kidney Injury, resolved Rule out history of CKD -Prior GFR per our records is within normal limits -unclear if patient carries an outpatient diagnosis of CKD -IV fluids ongoing until p.o. intake is more appropriate - Hold home lasix  and Ditropan  - Avoid nephrotoxins, Contrast Dyes, Hypotension and Dehydration    Chronic neurogenic Bowel, Bladder Chronic constipation - Continue bowel regimen  -reports recent BM - Continue Suprapubic catheter   Hypertension - Continue home Coreg  and Losartan     CHF, diastolic - Hold home Lasix  for additional 24 hours likely able to resume tomorrow - Continue home Coreg  -Most recent echo September 2022 with EF 45-50% with global hypokinesis grade 1 diastolic dysfunction   Hyperlipidemia CAD - Continue home statin - Continue home aspirin   Rheumatoid Arthritis - Continue home Plaquenil    Chronic Normocytic/iron  deficient anemia -Iron  panel consistently low, discussed supplementation of iron  in the diet as well as over-the-counter iron  tablets   Chronic sacral Decubitis Ulcer, POA - WOCN consult - Pressure offloading, q2H turn Pressure wound:    Constipation, acute on chronic - Increased regimen including MiraLAX  and Senokot, soapsuds enema offered  DVT prophylaxis: SCDs Start: 11/26/23 1106 Code Status:   Code Status: Full Code Family Communication: None present  Status is: Inpatient  Dispo: The patient is from: [facility]              Anticipated d/c is to: Same              Anticipated d/c date is: 48-72 hours              Patient currently not medically stable for discharge  Consultants:  General surgery  Procedures:  Sharp debridement 11/26/23 of labial abscess and phlegmon  Antimicrobials:  Cefepime , linezolid   Subjective: No acute issues or events overnight, pain currently well-controlled  Objective: Vitals:   11/26/23 2130 11/27/23 0024 11/27/23 0233 11/27/23 0431  BP:  (!) 81/50 (!) 104/58 (!) 92/44  Pulse:  84  79  Resp:  16  16  Temp: (!) 102.5 F (39.2 C) 98.8 F (37.1 C)  98 F (  36.7 C)  TempSrc: Oral Oral  Oral  SpO2:  96%  96%  Weight:      Height:        Intake/Output Summary (Last 24 hours) at 11/27/2023 0714 Last data filed at 11/27/2023 0400 Gross per 24 hour  Intake 2085.75 ml  Output 1850 ml  Net 235.75 ml   Filed Weights   11/26/23 0202  Weight: 74.8 kg     Examination:  General:  Pleasantly resting in bed, No acute distress. HEENT:  Normocephalic atraumatic. Neck:  Without mass or deformity.  Trachea is midline. Lungs: Diminished bilaterally without overt wheeze or rales. Heart:  Regular rate and rhythm Abdomen:  Soft, nontender, nondistended.  Without guarding or rebound. GU: Bandage clean dry intact, suprapubic catheter noted   Data Reviewed: I have personally reviewed following labs and imaging studies  CBC: Recent Labs  Lab 11/26/23 0249 11/27/23 0551  WBC 11.3* 14.6*  NEUTROABS 9.4*  --   HGB 9.9* 8.8*  HCT 30.8* 27.6*  MCV 80.2 78.9*  PLT 262 229   Basic Metabolic Panel: Recent Labs  Lab 11/26/23 0249 11/27/23 0551  NA 131* 130*  K 3.7 3.5  CL 99 101  CO2 21* 20*  GLUCOSE 93 112*  BUN 26* 16  CREATININE 1.21* 0.97  CALCIUM  9.2 8.6*   GFR: Estimated Creatinine Clearance: 53.4 mL/min (by C-G formula based on SCr of 0.97 mg/dL). Liver Function Tests: Recent Labs  Lab 11/26/23 0249 11/27/23 0551  AST 44* 41  ALT 32 43  ALKPHOS 68 70  BILITOT 0.7 1.3*  PROT 7.6 6.1*  ALBUMIN  2.5* 2.1*   Coagulation Profile: Recent Labs  Lab 11/26/23 0249  INR 1.2   Anemia Panel: Recent Labs    11/26/23 0249  FERRITIN 569*  TIBC 165*  IRON  20*   Sepsis Labs: Recent Labs  Lab 11/26/23 0326  LATICACIDVEN 1.2    Recent Results (from the past 240 hours)  Blood Culture (routine x 2)     Status: None (Preliminary result)   Collection Time: 11/26/23  2:12 AM   Specimen: BLOOD  Result Value Ref Range Status   Specimen Description BLOOD SITE NOT SPECIFIED  Final   Special Requests   Final    BOTTLES DRAWN AEROBIC AND ANAEROBIC Blood Culture results may not be optimal due to an inadequate volume of blood received in culture bottles   Culture   Final    NO GROWTH 1 DAY Performed at [facility], [address], [place], [place] [address]    Report Status PENDING  Incomplete  Blood Culture (routine  x 2)     Status: None (Preliminary result)   Collection Time: 11/26/23  3:20 AM   Specimen: BLOOD RIGHT ARM  Result Value Ref Range Status   Specimen Description BLOOD RIGHT ARM  Final   Special Requests   Final    BOTTLES DRAWN AEROBIC AND ANAEROBIC Blood Culture results may not be optimal due to an inadequate volume of blood received in culture bottles   Culture   Final    NO GROWTH 1 DAY Performed at [facility], [address], [place], [place] [address]    Report Status PENDING  Incomplete  Surgical PCR screen     Status: Abnormal   Collection Time: 11/26/23 12:23 PM   Specimen: Nasal Mucosa; Nasal Swab  Result Value Ref Range Status   MRSA, PCR NEGATIVE NEGATIVE Final   Staphylococcus aureus POSITIVE (A) NEGATIVE Final  Comment: (NOTE) The Xpert SA Assay (FDA approved for NASAL specimens in patients 44 years of age and older), is one component of a comprehensive surveillance program. It is not intended to diagnose infection nor to guide or monitor treatment. Performed at [facility], [address], [place], [place] [address]   Aerobic Culture w Gram Stain (superficial specimen)     Status: None (Preliminary result)   Collection Time: 11/26/23  6:48 PM   Specimen: Wound  Result Value Ref Range Status   Specimen Description WOUND  Final   Special Requests NONE  Final   Gram Stain   Final    NO WBC SEEN FEW GRAM POSITIVE COCCI IN PAIRS RARE GRAM NEGATIVE RODS RARE GRAM POSITIVE RODS Performed at [facility], [address] [place], [place] [address]    Culture PENDING  Incomplete   Report Status PENDING  Incomplete  Aerobic/Anaerobic Culture w Gram Stain (surgical/deep wound)     Status: None (Preliminary result)   Collection Time: 11/26/23  6:50 PM   Specimen: Bone; Tissue  Result Value Ref Range Status   Specimen Description BONE  Final   Special Requests B  Final   Gram Stain   Final    NO WBC SEEN FEW GRAM POSITIVE COCCI IN PAIRS RARE  GRAM NEGATIVE RODS RARE GRAM POSITIVE RODS Performed at [facility], [address], [place], [place] [address]    Culture PENDING  Incomplete   Report Status PENDING  Incomplete  Aerobic/Anaerobic Culture w Gram Stain (surgical/deep wound)     Status: None (Preliminary result)   Collection Time: 11/26/23  6:52 PM   Specimen: Soft Tissue, Other  Result Value Ref Range Status   Specimen Description TISSUE  Final   Special Requests C  Final   Gram Stain   Final    NO WBC SEEN FEW GRAM POSITIVE COCCI IN PAIRS RARE GRAM NEGATIVE RODS RARE GRAM POSITIVE RODS Performed at [facility], [address], [place], [place] [address]    Culture PENDING  Incomplete   Report Status PENDING  Incomplete         Radiology Studies: CT ABDOMEN PELVIS W CONTRAST Result Date: 11/26/2023 CLINICAL DATA:  Sepsis. Evaluate for labial abscess, sacral osteomyelitis. EXAM: CT ABDOMEN AND PELVIS WITH CONTRAST TECHNIQUE: Multidetector CT imaging of the abdomen and pelvis was performed using the standard protocol following bolus administration of intravenous contrast. RADIATION DOSE REDUCTION: This exam was performed according to the departmental dose-optimization program which includes automated exposure control, adjustment of the mA and/or kV according to patient size and/or use of iterative reconstruction technique. CONTRAST:  75mL OMNIPAQUE  IOHEXOL  350 MG/ML SOLN COMPARISON:  Numerous prior CTs back to 2009. The 2 most recent are both with for contrast dated 05/10/2023 and 08/19/2023. FINDINGS: Lower chest: Posterior atelectasis again noted on right-greater-than-left. Chronic elevated right diaphragm. No new abnormality in the lung bases. Moderate-sized hiatal hernia again noted. The cardiac size is normal. Calcification in the left main coronary artery. No pericardial fluid. Hepatobiliary: Stable 3.9 x 2.1 cm mixed density lesion in posterior aspect of the liver in segment 6 is again noted, unchanged  consistent with previously reported ablation defect. Is 20 cm length and slightly steatotic without other focal abnormality. Gallbladder is absent without biliary dilatation. Pancreas: No abnormality. Spleen: 8.5 cm complex densely rim calcified cystic lesion inferior aspect of the spleen is unchanged, likely due to remote trauma or infection. The remainder of the spleen  is unremarkable. Adrenals/Urinary Tract: Stable mild nodular thickening the adrenal glands. Multiple unchanged small subcentimeter Bosniak 2 renal cortical cysts which are too small to characterize. No follow-up imaging is recommended. There is no urinary stone or obstruction. The bladder catheterized, contracted and not well seen, with suprapubic catheter in place. Stomach/Bowel: No bowel obstruction or inflammatory changes. Hiatal hernia described above. An appendix is not seen. There is moderate retained stool ascending and transverse colon. Small anorectal prolapse. Vascular/Lymphatic: Moderate aortoiliac calcific plaque. No AAA. Increased right inguinal chain adenopathy up to 1.6 cm short axis. Increasingly prominent right external iliac chain nodes to 1.9 cm, increased left external iliac chain nodes to 1.2 cm. No abdominal adenopathy is seen. Reproductive: Fibroid uterus with calcified fibroids, largest is 8.8 cm. No adnexal mass. Other: No free fluid or incarcerated hernia. Musculoskeletal: Vaginal labial swelling is noted especially on the right where there is a complex hypodense collection measuring 5.2 x 2.9 cm on 3:100, 31 Hounsfield units, consistent with abscess or phlegmon. Decubitus changes are again noted about the right ischial tuberosity with deformity. There is subcutaneous patchy soft tissue gas underlying this and extending over towards the right side of the perineum consistent with a gas-forming infection. Underlying osteomyelitis is difficult to exclude as well as an early presentation of Fournier's gangrene. No underlying  destructive change to the bone is seen and no loculated collection except in the right labia. Again noted are chronic fracture nonunions in both femoral necks, with moderate granulation tissue. Advanced degenerative change lumbar spine with interbody ankylosis multiple levels. IMPRESSION: 1. 5.2 x 2.9 cm complex hypodense collection in the right labia consistent with abscess or phlegmon. 2. Decubitus changes about the right ischial tuberosity with deformity. There is subcutaneous patchy soft tissue gas underlying this and extending over towards the right side of the perineum consistent with a gas-forming infection. Underlying osteomyelitis is difficult to exclude as well as an early presentation of Fournier's gangrene. 3. Increased right inguinal and right greater left external iliac chain adenopathy is likely reactive. 4. Constipation. 5. Hiatal hernia. 6. Aortic and coronary artery atherosclerosis. 7. Stable 3.9 x 2.1 cm mixed density lesion in the posterior aspect of the liver in segment 6 consistent with previously reported ablation defect. 8. Fibroid uterus. 9. 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 Aortic Atherosclerosis (ICD10-I70.0). Electronically Signed   By: Denman Fischer M.D.   On: 11/26/2023 06:35        Scheduled Meds:  aspirin  EC  81 mg Oral Daily   atorvastatin   40 mg Oral QHS   baclofen   10 mg Oral TID   bisacodyl   10 mg Rectal Q T,Th,S,Su-1800   calcium -vitamin D   1 tablet Oral BID   carvedilol   12.5 mg Oral BID WC   cholecalciferol   1,000 Units Oral Daily   ferrous gluconate   324 mg Oral Q breakfast   hydroxychloroquine   200 mg Oral Daily   lidocaine   1 patch Transdermal Q24H   linaclotide   290 mcg Oral QHS   losartan   25 mg Oral Daily   mupirocin  ointment  1 Application Nasal BID   pantoprazole   40 mg Oral Daily   pneumococcal 20-valent conjugate vaccine  0.5 mL Intramuscular Tomorrow-1000   polyethylene glycol  17 g Oral BID   prednisoLONE  acetate  1 drop Left  Eye TID   tiZANidine   6 mg Oral TID   Continuous Infusions:  ceFEPime  (MAXIPIME ) IV     ceFEPime  (MAXIPIME ) IV 2 g (11/26/23 2149)  lactated ringers  Stopped (11/26/23 1700)   linezolid  (ZYVOX ) IV 600 mg (11/26/23 2347)     LOS: 1 day   Time spent:  Haydee Lipa, DO [facility]  If 7PM-7AM, please contact night-coverage [email or web address]  11/27/2023, 7:14 AM

## 2023-11-27 NOTE — Progress Notes (Signed)
 Progress Note  1 Day Post-Op  Subjective: Pt had just had a large loose BM. Dressing and pads changed with RN   Objective: Vital signs in last 24 hours: Temp:  [98 F (36.7 C)-102.5 F (39.2 C)] 98 F (36.7 C) (05/03 0742) Pulse Rate:  [71-91] 75 (05/03 0742) Resp:  [16-30] 16 (05/03 0742) BP: (81-122)/(28-97) 90/57 (05/03 0742) SpO2:  [93 %-100 %] 99 % (05/03 0742) FiO2 (%):  [0 %] 0 % (05/02 1156) Last BM Date : 11/26/23  Intake/Output from previous day: 05/02 0701 - 05/03 0700 In: 2085.8 [P.O.:70; I.V.:270.8; IV Piggyback:1745] Out: 1850 [Urine:1850] Intake/Output this shift: No intake/output data recorded.  PE: General: pleasant, WD, [name] who is laying in bed in NAD Heart: regular, rate, and rhythm.   Lungs: Respiratory effort nonlabored Abd: soft, NT, ND GU: perineal wound clean with some fibrinous tissue in the base and some oozing from skin edge but no major bleeding Psych: A&Ox3 with an appropriate affect., SP tube present    Lab Results:  Recent Labs    11/26/23 0249 11/27/23 0551  WBC 11.3* 14.6*  HGB 9.9* 8.8*  HCT 30.8* 27.6*  PLT 262 229   BMET Recent Labs    11/26/23 0249 11/27/23 0551  NA 131* 130*  K 3.7 3.5  CL 99 101  CO2 21* 20*  GLUCOSE 93 112*  BUN 26* 16  CREATININE 1.21* 0.97  CALCIUM  9.2 8.6*   PT/INR Recent Labs    11/26/23 0249  LABPROT 14.9  INR 1.2   CMP     Component Value Date/Time   NA 130 (L) 11/27/2023 0551   NA 138 06/07/2018 1411   K 3.5 11/27/2023 0551   CL 101 11/27/2023 0551   CO2 20 (L) 11/27/2023 0551   GLUCOSE 112 (H) 11/27/2023 0551   BUN 16 11/27/2023 0551   BUN 13 06/07/2018 1411   CREATININE 0.97 11/27/2023 0551   CREATININE 0.70 05/10/2023 1244   CREATININE 0.58 07/18/2018 1146   CALCIUM  8.6 (L) 11/27/2023 0551   PROT 6.1 (L) 11/27/2023 0551   ALBUMIN  2.1 (L) 11/27/2023 0551   AST 41 11/27/2023 0551   ALT 43 11/27/2023 0551   ALKPHOS 70 11/27/2023 0551   BILITOT 1.3 (H)  11/27/2023 0551   GFRNONAA >60 11/27/2023 0551   GFRNONAA >60 05/10/2023 1244   GFRNONAA 95 07/18/2018 1146   GFRAA 113 09/07/2018 1005   GFRAA 111 07/18/2018 1146   Lipase     Component Value Date/Time   LIPASE 25 08/19/2023 0219       Studies/Results: CT ABDOMEN PELVIS W CONTRAST Result Date: 11/26/2023 CLINICAL DATA:  Sepsis. Evaluate for labial abscess, sacral osteomyelitis. EXAM: CT ABDOMEN AND PELVIS WITH CONTRAST TECHNIQUE: Multidetector CT imaging of the abdomen and pelvis was performed using the standard protocol following bolus administration of intravenous contrast. RADIATION DOSE REDUCTION: This exam was performed according to the departmental dose-optimization program which includes automated exposure control, adjustment of the mA and/or kV according to patient size and/or use of iterative reconstruction technique. CONTRAST:  75mL OMNIPAQUE  IOHEXOL  350 MG/ML SOLN COMPARISON:  Numerous prior CTs back to 2009. The 2 most recent are both with for contrast dated 05/10/2023 and 08/19/2023. FINDINGS: Lower chest: Posterior atelectasis again noted on right-greater-than-left. Chronic elevated right diaphragm. No new abnormality in the lung bases. Moderate-sized hiatal hernia again noted. The cardiac size is normal. Calcification in the left main coronary artery. No pericardial fluid. Hepatobiliary: Stable 3.9 x 2.1 cm mixed density  lesion in posterior aspect of the liver in segment 6 is again noted, unchanged consistent with previously reported ablation defect. Is 20 cm length and slightly steatotic without other focal abnormality. Gallbladder is absent without biliary dilatation. Pancreas: No abnormality. Spleen: 8.5 cm complex densely rim calcified cystic lesion inferior aspect of the spleen is unchanged, likely due to remote trauma or infection. The remainder of the spleen is unremarkable. Adrenals/Urinary Tract: Stable mild nodular thickening the adrenal glands. Multiple unchanged small  subcentimeter Bosniak 2 renal cortical cysts which are too small to characterize. No follow-up imaging is recommended. There is no urinary stone or obstruction. The bladder catheterized, contracted and not well seen, with suprapubic catheter in place. Stomach/Bowel: No bowel obstruction or inflammatory changes. Hiatal hernia described above. An appendix is not seen. There is moderate retained stool ascending and transverse colon. Small anorectal prolapse. Vascular/Lymphatic: Moderate aortoiliac calcific plaque. No AAA. Increased right inguinal chain adenopathy up to 1.6 cm short axis. Increasingly prominent right external iliac chain nodes to 1.9 cm, increased left external iliac chain nodes to 1.2 cm. No abdominal adenopathy is seen. Reproductive: Fibroid uterus with calcified fibroids, largest is 8.8 cm. No adnexal mass. Other: No free fluid or incarcerated hernia. Musculoskeletal: Vaginal labial swelling is noted especially on the right where there is a complex hypodense collection measuring 5.2 x 2.9 cm on 3:100, 31 Hounsfield units, consistent with abscess or phlegmon. Decubitus changes are again noted about the right ischial tuberosity with deformity. There is subcutaneous patchy soft tissue gas underlying this and extending over towards the right side of the perineum consistent with a gas-forming infection. Underlying osteomyelitis is difficult to exclude as well as an early presentation of Fournier's gangrene. No underlying destructive change to the bone is seen and no loculated collection except in the right labia. Again noted are chronic fracture nonunions in both femoral necks, with moderate granulation tissue. Advanced degenerative change lumbar spine with interbody ankylosis multiple levels. IMPRESSION: 1. 5.2 x 2.9 cm complex hypodense collection in the right labia consistent with abscess or phlegmon. 2. Decubitus changes about the right ischial tuberosity with deformity. There is subcutaneous patchy  soft tissue gas underlying this and extending over towards the right side of the perineum consistent with a gas-forming infection. Underlying osteomyelitis is difficult to exclude as well as an early presentation of Fournier's gangrene. 3. Increased right inguinal and right greater left external iliac chain adenopathy is likely reactive. 4. Constipation. 5. Hiatal hernia. 6. Aortic and coronary artery atherosclerosis. 7. Stable 3.9 x 2.1 cm mixed density lesion in the posterior aspect of the liver in segment 6 consistent with previously reported ablation defect. 8. Fibroid uterus. 9. 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 Aortic Atherosclerosis (ICD10-I70.0). Electronically Signed   By: Denman Fischer M.D.   On: 11/26/2023 06:35    Anti-infectives: Anti-infectives (From admission, onward)    Start     Dose/Rate Route Frequency Ordered Stop   11/26/23 2200  ceFEPIme  (MAXIPIME ) 2 g in sodium chloride  0.9 % 100 mL IVPB        2 g 200 mL/hr over 30 Minutes Intravenous Every 12 hours 11/26/23 0723     11/26/23 1415  hydroxychloroquine  (PLAQUENIL ) tablet 200 mg        200 mg Oral Daily 11/26/23 1326     11/26/23 1000  linezolid  (ZYVOX ) IVPB 600 mg  Status:  Discontinued        600 mg 300 mL/hr over 60 Minutes Intravenous Every 12 hours 11/26/23  1610 11/26/23 0705   11/26/23 0730  linezolid  (ZYVOX ) IVPB 600 mg        600 mg 300 mL/hr over 60 Minutes Intravenous Every 12 hours 11/26/23 0704     11/26/23 0715  ceFEPIme  (MAXIPIME ) 2 g in sodium chloride  0.9 % 100 mL IVPB        2 g 200 mL/hr over 30 Minutes Intravenous  Once 11/26/23 0706     11/26/23 0245  clindamycin  (CLEOCIN ) IVPB 600 mg        600 mg 100 mL/hr over 30 Minutes Intravenous  Once 11/26/23 9604 11/26/23 0435        Assessment/Plan  Labial/perineal abscess POD1 s/p I&D Dr. Marny Sires  - recommend BID wet to dry dressing change or PRN if soiled with stool - frequent turning and limit pressure to wound - continue  abx, Cxs pending  - will see again Monday unless acute issues arise from wound   FEN: reg diet VTE: SCDs ID: [email or web address]   LOS: 1 day      [name], [facility] 11/27/2023, 10:13 AM Please see Amion for pager number during day hours 7:00am-4:30pm

## 2023-11-27 NOTE — Plan of Care (Signed)
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
## 2023-11-28 DIAGNOSIS — N764 Abscess of vulva: Secondary | ICD-10-CM | POA: Diagnosis not present

## 2023-11-28 LAB — CBC
HCT: 29.9 % — ABNORMAL LOW (ref 36.0–46.0)
Hemoglobin: 9.5 g/dL — ABNORMAL LOW (ref 12.0–15.0)
MCH: 25.4 pg — ABNORMAL LOW (ref 26.0–34.0)
MCHC: 31.8 g/dL (ref 30.0–36.0)
MCV: 79.9 fL — ABNORMAL LOW (ref 80.0–100.0)
Platelets: 259 10*3/uL (ref 150–400)
RBC: 3.74 MIL/uL — ABNORMAL LOW (ref 3.87–5.11)
RDW: 15.9 % — ABNORMAL HIGH (ref 11.5–15.5)
WBC: 10.2 10*3/uL (ref 4.0–10.5)
nRBC: 0 % (ref 0.0–0.2)

## 2023-11-28 LAB — COMPREHENSIVE METABOLIC PANEL WITH GFR
ALT: 44 U/L (ref 0–44)
AST: 35 U/L (ref 15–41)
Albumin: 2.3 g/dL — ABNORMAL LOW (ref 3.5–5.0)
Alkaline Phosphatase: 66 U/L (ref 38–126)
Anion gap: 11 (ref 5–15)
BUN: 13 mg/dL (ref 8–23)
CO2: 21 mmol/L — ABNORMAL LOW (ref 22–32)
Calcium: 8.9 mg/dL (ref 8.9–10.3)
Chloride: 104 mmol/L (ref 98–111)
Creatinine, Ser: 0.86 mg/dL (ref 0.44–1.00)
GFR, Estimated: >60 mL/min (ref >60)
Glucose, Bld: 118 mg/dL — ABNORMAL HIGH (ref 70–99)
Potassium: 3.3 mmol/L — ABNORMAL LOW (ref 3.5–5.1)
Sodium: 136 mmol/L (ref 135–145)
Total Bilirubin: 0.9 mg/dL (ref 0.0–1.2)
Total Protein: 7.2 g/dL (ref 6.5–8.1)

## 2023-11-28 LAB — GLUCOSE, CAPILLARY: Glucose-Capillary: 147 mg/dL — ABNORMAL HIGH (ref 70–99)

## 2023-11-28 MED ORDER — LINEZOLID 600 MG PO TABS
600.0000 mg | ORAL_TABLET | Freq: Two times a day (BID) | ORAL | Status: DC
  Administered 2023-11-28 – 2023-11-30 (×5): 600 mg via ORAL
  Filled 2023-11-28 (×5): qty 1

## 2023-11-28 NOTE — Progress Notes (Signed)
 Patient irritable, anxious, and upset of her symptoms stating nothing is being done. Patient states she is having heartburn and gas, also complains of abdominal distention. On call MD notified and home medication for GERD ordered. Medication for gas also ordered. She also stated that she was itching, for which hydroxyzine  was offered but was refused by patient. All medications ordered and scheduled were discussed with patient, however patient continues to be very irritable, stating "you keep coming in here with medicines and I don't know what you're giving me". All medications explained again. She has also refused labial wound dressing as of now. Will continue to monitor.

## 2023-11-28 NOTE — Plan of Care (Signed)

## 2023-11-28 NOTE — Progress Notes (Signed)
 PROGRESS NOTE    [name]  ZOX:[id number] DOB: [birth year] DOA: 11/26/2023 PCP: System, Provider Not In   Brief Narrative:  [name] is a 73 y.o. 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   She presents with worsening labial wound/abscess that is well documented and chronic - unfortunately has been more painful. Recently partially excised 11/23/23 and placed on clindamycin  with worsening systemic features of weakness/malaise, nausea, dizziness and poor appetite.   Assessment & Plan:   Principal Problem:   Labial abscess Active Problems:   Essential hypertension   Constipation   Iron  deficiency anemia   CHF (congestive heart failure) (HCC)   Neurogenic bowel   AKI (acute kidney injury) (HCC)   Suprapubic catheter (HCC)   Dyslipidemia   Sepsis secondary to labial/Groin Cellulitus POA Concurrent (R) Labial Abscess vs Phlegmon - Continue Cefepime  and Linezolid  - Follow up blood cultures - General Surgery following - 11/26/23 sharp debridement of labial perineal necrotizing infection with signs of osteomyelitis  - Wound Care Consult - Will discuss case with ID once cultures have resulted   Acute Kidney Injury, resolved Rule out history of CKD -Prior GFR per our records is within normal limits -unclear if patient carries an outpatient diagnosis of CKD -IV fluids ongoing until p.o. intake is more appropriate - Hold home lasix  and Ditropan  - Avoid nephrotoxins, Contrast Dyes, Hypotension and Dehydration    Chronic neurogenic Bowel, Bladder Chronic constipation - Continue bowel regimen  -reports recent BM - Continue Suprapubic catheter   Hypertension - Continue home Coreg  and Losartan     CHF, diastolic - Hold home Lasix  for additional 24 hours likely able to resume tomorrow - Continue home Coreg  -Most recent echo September 2022 with EF 45-50% with global hypokinesis grade 1 diastolic dysfunction   Hyperlipidemia CAD - Continue home statin - Continue home aspirin   Rheumatoid Arthritis - Continue home Plaquenil    Chronic Normocytic/iron  deficient anemia -Iron  panel consistently low, discussed supplementation of iron  in the diet as well as over-the-counter iron  tablets   Chronic sacral Decubitis Ulcer, POA - WOCN consult - Pressure offloading, q2H turn Pressure wound:    Constipation, acute on chronic - Increased regimen including MiraLAX  and Senokot, soapsuds enema offered  DVT prophylaxis: SCDs Start: 11/26/23 1106 Code Status:   Code Status: Full Code Family Communication: None present  Status is: Inpatient  Dispo: The patient is from: [facility]              Anticipated d/c is to: Same              Anticipated d/c date is: 48-72 hours              Patient currently not medically stable for discharge  Consultants:  General surgery  Procedures:  Sharp debridement 11/26/23 of labial abscess and phlegmon  Antimicrobials:  Cefepime , linezolid   Subjective: No acute issues or events overnight, pain currently well-controlled, complaining of cough today, nonproductive and appears to be chronic but otherwise denies nausea vomiting diarrhea headache fevers chills or chest pain  Objective: Vitals:   11/27/23 2015 11/27/23 2030 11/27/23 2100 11/28/23 0533  BP: (!) 95/57  105/63 96/74  Pulse: 76  76 73  Resp: 18  19 19   Temp: 98.6 F (37 C)   98.1 F (36.7 C)  TempSrc:      SpO2: (!) 88% 100% 99% 96%  Weight:      Height:        Intake/Output Summary (Last 24 hours) at 11/28/2023 0807 Last data filed at 11/28/2023 0700 Gross per 24 hour  Intake  240 ml  Output 2200 ml  Net -1960 ml   Filed Weights   11/26/23 0202  Weight: 74.8 kg    Examination:  General:  Pleasantly resting in bed, No acute distress. HEENT:  Normocephalic atraumatic. Neck:  Without mass or deformity.  Trachea is midline. Lungs: Diminished bilaterally without overt wheeze or rales. Heart:  Regular rate and rhythm Abdomen:  Soft, nontender, nondistended.  Without guarding or rebound. GU: Deferred   Data Reviewed: I have personally reviewed following labs and imaging studies  CBC: Recent Labs  Lab 11/26/23 0249 11/27/23 0551 11/28/23 0513  WBC 11.3* 14.6* 10.2  NEUTROABS 9.4*  --   --   HGB 9.9* 8.8* 9.5*  HCT 30.8* 27.6* 29.9*  MCV 80.2 78.9* 79.9*  PLT 262 229 259   Basic Metabolic Panel: Recent Labs  Lab 11/26/23 0249 11/27/23 0551 11/28/23 0513  NA 131* 130* 136  K 3.7 3.5 3.3*  CL 99 101 104  CO2 21* 20* 21*  GLUCOSE 93 112* 118*  BUN 26* 16 13  CREATININE 1.21* 0.97 0.86  CALCIUM  9.2 8.6* 8.9   GFR: Estimated Creatinine Clearance: 60.2 mL/min (by C-G formula based on SCr of 0.86 mg/dL). Liver Function Tests: Recent Labs  Lab 11/26/23 0249 11/27/23 0551 11/28/23 0513  AST 44* 41 35  ALT 32 43 44  ALKPHOS 68 70 66  BILITOT 0.7 1.3* 0.9  PROT 7.6 6.1* 7.2  ALBUMIN  2.5* 2.1* 2.3*   Coagulation Profile: Recent Labs  Lab 11/26/23 0249  INR 1.2   Anemia Panel: Recent Labs    11/26/23 0249  FERRITIN 569*  TIBC 165*  IRON  20*   Sepsis Labs: Recent Labs  Lab 11/26/23 0326  LATICACIDVEN 1.2    Recent Results (from the past 240 hours)  Blood Culture (routine x 2)     Status: None (Preliminary result)   Collection Time: 11/26/23  2:12 AM   Specimen: BLOOD  Result Value Ref Range Status   Specimen Description BLOOD SITE NOT SPECIFIED  Final   Special Requests   Final    BOTTLES DRAWN AEROBIC AND ANAEROBIC Blood Culture results may not be optimal due to an inadequate volume of blood received in culture bottles    Culture   Final    NO GROWTH 2 DAYS Performed at [facility], [address] [place], [place] [address]    Report Status PENDING  Incomplete  Blood Culture (routine x 2)     Status: None (Preliminary result)   Collection Time: 11/26/23  3:20 AM   Specimen: BLOOD RIGHT ARM  Result Value Ref Range Status   Specimen Description BLOOD RIGHT ARM  Final   Special Requests   Final    BOTTLES DRAWN AEROBIC AND ANAEROBIC Blood Culture results may not be optimal due to an inadequate volume of blood received in culture bottles   Culture   Final    NO GROWTH 2 DAYS Performed at [facility], [address], [place], [place] [address]    Report Status PENDING  Incomplete  Surgical PCR screen  Status: Abnormal   Collection Time: 11/26/23 12:23 PM   Specimen: Nasal Mucosa; Nasal Swab  Result Value Ref Range Status   MRSA, PCR NEGATIVE NEGATIVE Final   Staphylococcus aureus POSITIVE (A) NEGATIVE Final    Comment: (NOTE) The Xpert SA Assay (FDA approved for NASAL specimens in patients 12 years of age and older), is one component of a comprehensive surveillance program. It is not intended to diagnose infection nor to guide or monitor treatment. Performed at [facility], [address] [place], [place] [address]   Aerobic Culture w Gram Stain (superficial specimen)     Status: None (Preliminary result)   Collection Time: 11/26/23  6:48 PM   Specimen: Wound  Result Value Ref Range Status   Specimen Description WOUND  Final   Special Requests NONE  Final   Gram Stain   Final    NO WBC SEEN FEW GRAM POSITIVE COCCI IN PAIRS RARE GRAM NEGATIVE RODS RARE GRAM POSITIVE RODS    Culture   Final    TOO YOUNG TO READ Performed at [facility], [address], [place], [place] [address]    Report Status PENDING  Incomplete  Aerobic/Anaerobic Culture w Gram Stain (surgical/deep wound)     Status: None (Preliminary result)   Collection Time: 11/26/23  6:50 PM    Specimen: Bone; Tissue  Result Value Ref Range Status   Specimen Description BONE  Final   Special Requests B  Final   Gram Stain   Final    NO WBC SEEN FEW GRAM POSITIVE COCCI IN PAIRS RARE GRAM NEGATIVE RODS RARE GRAM POSITIVE RODS    Culture   Final    TOO YOUNG TO READ Performed at [facility], [address] [place], [place] [address]    Report Status PENDING  Incomplete  Aerobic/Anaerobic Culture w Gram Stain (surgical/deep wound)     Status: None (Preliminary result)   Collection Time: 11/26/23  6:52 PM   Specimen: Soft Tissue, Other  Result Value Ref Range Status   Specimen Description TISSUE  Final   Special Requests C  Final   Gram Stain   Final    NO WBC SEEN FEW GRAM POSITIVE COCCI IN PAIRS RARE GRAM NEGATIVE RODS RARE GRAM POSITIVE RODS    Culture   Final    TOO YOUNG TO READ Performed at [facility], [address], [place], [place] [address]    Report Status PENDING  Incomplete         Radiology Studies: No results found.       Scheduled Meds:  aspirin  EC  81 mg Oral Daily   atorvastatin   40 mg Oral QHS   baclofen   10 mg Oral TID   bisacodyl   10 mg Rectal Q T,Th,S,Su-1800   calcium -vitamin D   1 tablet Oral BID   carvedilol   12.5 mg Oral BID WC   cholecalciferol   1,000 Units Oral Daily   ferrous gluconate   324 mg Oral Q breakfast   hydroxychloroquine   200 mg Oral Daily   lidocaine   1 patch Transdermal Q24H   linaclotide   290 mcg Oral QHS   losartan   25 mg Oral Daily   mupirocin  ointment  1 Application Nasal BID   pantoprazole   40 mg Oral Daily   polyethylene glycol  17 g Oral BID   prednisoLONE  acetate  1 drop Left Eye TID   senna-docusate  1 tablet Oral BID   tiZANidine   6 mg  Oral TID   Continuous Infusions:  ceFEPime  (MAXIPIME ) IV 2 g (11/27/23 2113)   linezolid  (ZYVOX ) IV 600 mg (11/27/23 2334)     LOS: 2 days   Time spent:  Haydee Lipa, DO [facility]  If 7PM-7AM, please contact  night-coverage [email or web address]  11/28/2023, 8:07 AM

## 2023-11-28 NOTE — Anesthesia Postprocedure Evaluation (Signed)
 Anesthesia Post Note  Patient: [name]  Procedure(s) Performed: IRRIGATION AND DEBRIDEMENT ABSCESS     Patient location during evaluation: PACU Anesthesia Type: MAC Level of consciousness: awake and alert Pain management: pain level controlled Vital Signs Assessment: post-procedure vital signs reviewed and stable Respiratory status: spontaneous breathing, nonlabored ventilation, respiratory function stable and patient connected to nasal cannula oxygen Cardiovascular status: stable and blood pressure returned to baseline Postop Assessment: no apparent nausea or vomiting Anesthetic complications: no   No notable events documented.  Last Vitals:  Vitals:   11/27/23 2100 11/28/23 0533  BP: 105/63 96/74  Pulse: 76 73  Resp: 19 19  Temp:  36.7 C  SpO2: 99% 96%    Last Pain:  Vitals:   11/28/23 0218  TempSrc:   PainSc: Asleep                 [name] S

## 2023-11-29 ENCOUNTER — Encounter (HOSPITAL_COMMUNITY): Payer: Self-pay | Admitting: Surgery

## 2023-11-29 DIAGNOSIS — N764 Abscess of vulva: Secondary | ICD-10-CM | POA: Diagnosis not present

## 2023-11-29 LAB — AEROBIC CULTURE W GRAM STAIN (SUPERFICIAL SPECIMEN): Gram Stain: NONE SEEN

## 2023-11-29 MED ORDER — SODIUM CHLORIDE 0.9 % IV SOLN
2.0000 g | Freq: Three times a day (TID) | INTRAVENOUS | Status: DC
  Administered 2023-11-29 – 2023-12-01 (×6): 2 g via INTRAVENOUS
  Filled 2023-11-29 (×6): qty 12.5

## 2023-11-29 MED ORDER — GUAIFENESIN-DM 100-10 MG/5ML PO SYRP
10.0000 mL | ORAL_SOLUTION | Freq: Four times a day (QID) | ORAL | Status: DC | PRN
  Administered 2023-11-30 – 2023-12-03 (×3): 10 mL via ORAL
  Filled 2023-11-29 (×4): qty 10

## 2023-11-29 MED ORDER — FUROSEMIDE 40 MG PO TABS
40.0000 mg | ORAL_TABLET | Freq: Two times a day (BID) | ORAL | Status: DC
  Administered 2023-11-29 – 2023-12-02 (×7): 40 mg via ORAL
  Filled 2023-11-29 (×7): qty 1

## 2023-11-29 NOTE — Progress Notes (Signed)
 PROGRESS NOTE    [name]  ZDG:[id number] DOB: 03-Oct-1950 DOA: 11/26/2023 PCP: System, Provider Not In   Brief Narrative:  [name] is a 73 y.o. 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   She presents with worsening labial wound/abscess that is well documented and chronic - unfortunately has been more painful. Recently partially excised 11/23/23 and placed on clindamycin  with worsening systemic features of weakness/malaise, nausea, dizziness and poor appetite.   Assessment & Plan:   Principal Problem:   Labial abscess Active Problems:   Essential hypertension   Constipation   Iron  deficiency anemia   CHF (congestive heart failure) (HCC)   Neurogenic bowel   AKI (acute kidney injury) (HCC)   Suprapubic catheter (HCC)   Dyslipidemia   Sepsis secondary to labial/Groin Cellulitus POA Concurrent (R) Labial Abscess vs Phlegmon - Continue Cefepime  and Linezolid  - Follow up blood cultures - General Surgery following - 11/26/23 sharp debridement of labial perineal necrotizing infection with signs of osteomyelitis  - Wound Care Consult - Will discuss case with ID once cultures have resulted - preliminary results indicate multi species   Acute Kidney Injury, resolved Rule out history of CKD -Prior GFR per our records is within normal limits -unclear if patient carries an outpatient diagnosis of CKD -IV fluids ongoing until p.o. intake is more appropriate - Hold home lasix  and Ditropan  - Avoid nephrotoxins, Contrast Dyes, Hypotension and Dehydration    Rule out sundowning/delirium - Patient  indicates episodes of having "odd daydreams" where she sees different places but is very nonspecific about timing.  Family noted the patient to be more somnolent over the past 24 hours than prior.  Concern for poor sleep cycle.  Chronic neurogenic Bowel, Bladder Chronic constipation - Continue bowel regimen -reports recent BM - Continue Suprapubic catheter   Hypertension - Continue home Coreg  and Losartan     CHF, diastolic - Resume Lasix  - Continue home Coreg  -Most recent echo September 2022 with EF 45-50% with global hypokinesis grade 1 diastolic dysfunction *Patient is not hypoxic, was placed on oxygen overnight in the setting of "comfort" by nursing staff for cough.   Hyperlipidemia CAD - Continue home statin - Continue home aspirin   Rheumatoid Arthritis - Continue home Plaquenil    Chronic Normocytic/iron  deficient anemia -Iron  panel consistently low, discussed supplementation of iron  in the diet as well as over-the-counter iron  tablets   Chronic sacral Decubitis Ulcer, POA - WOCN consult - Pressure offloading, q2H turn Pressure wound:    Constipation, acute on chronic - Increased regimen including MiraLAX  and Senokot, soapsuds enema offered  DVT prophylaxis: SCDs Start: 11/26/23 1106 Code Status:   Code Status: Full Code Family Communication: None present  Status is: Inpatient  Dispo: The patient is from: [facility]              Anticipated d/c is to: Same              Anticipated d/c date is: 48-72 hours              Patient currently not medically stable for discharge  Consultants:  General surgery  Procedures:  Sharp debridement 11/26/23 of labial abscess and phlegmon  Antimicrobials:  Cefepime ,  linezolid   Subjective: No acute issues or events overnight, pain currently well-controlled, complaining of atypical daydreams/delirium  Objective: Vitals:   11/29/23 0030 11/29/23 0625 11/29/23 0711 11/29/23 1347  BP:  118/65 (!) 124/53 110/64  Pulse:  71 88  73  Resp: 18 16 16 18   Temp:  98.4 F (36.9 C) 97.6 F (36.4 C) 97.9 F (36.6 C)  TempSrc:  Oral Oral Oral  SpO2: 97% 98% 100% 94%  Weight:      Height:        Intake/Output Summary (Last 24 hours) at 11/29/2023 1354 Last data filed at 11/29/2023 1100 Gross per 24 hour  Intake 950 ml  Output 1375 ml  Net -425 ml   Filed Weights   11/26/23 0202  Weight: 74.8 kg    Examination:  General:  Pleasantly resting in bed, No acute distress. HEENT:  Normocephalic atraumatic. Neck:  Without mass or deformity.  Trachea is midline. Lungs: Diminished bilaterally without overt wheeze or rales. Heart:  Regular rate and rhythm Abdomen:  Soft, nontender, nondistended.  Without guarding or rebound. GU: Deferred   Data Reviewed: I have personally reviewed following labs and imaging studies  CBC: Recent Labs  Lab 11/26/23 0249 11/27/23 0551 11/28/23 0513  WBC 11.3* 14.6* 10.2  NEUTROABS 9.4*  --   --   HGB 9.9* 8.8* 9.5*  HCT 30.8* 27.6* 29.9*  MCV 80.2 78.9* 79.9*  PLT 262 229 259   Basic Metabolic Panel: Recent Labs  Lab 11/26/23 0249 11/27/23 0551 11/28/23 0513  NA 131* 130* 136  K 3.7 3.5 3.3*  CL 99 101 104  CO2 21* 20* 21*  GLUCOSE 93 112* 118*  BUN 26* 16 13  CREATININE 1.21* 0.97 0.86  CALCIUM  9.2 8.6* 8.9   GFR: Estimated Creatinine Clearance: 60.2 mL/min (by C-G formula based on SCr of 0.86 mg/dL). Liver Function Tests: Recent Labs  Lab 11/26/23 0249 11/27/23 0551 11/28/23 0513  AST 44* 41 35  ALT 32 43 44  ALKPHOS 68 70 66  BILITOT 0.7 1.3* 0.9  PROT 7.6 6.1* 7.2  ALBUMIN  2.5* 2.1* 2.3*   Coagulation Profile: Recent Labs  Lab 11/26/23 0249  INR 1.2   Anemia Panel: No results for input(s): "VITAMINB12", "FOLATE", "FERRITIN", "TIBC", "IRON ", "RETICCTPCT" in the last 72 hours.  Sepsis Labs: Recent Labs  Lab 11/26/23 0326  LATICACIDVEN 1.2    Recent Results (from the past 240 hours)  Blood Culture (routine x 2)     Status: None  (Preliminary result)   Collection Time: 11/26/23  2:12 AM   Specimen: BLOOD  Result Value Ref Range Status   Specimen Description BLOOD SITE NOT SPECIFIED  Final   Special Requests   Final    BOTTLES DRAWN AEROBIC AND ANAEROBIC Blood Culture results may not be optimal due to an inadequate volume of blood received in culture bottles   Culture   Final    NO GROWTH 3 DAYS Performed at [facility], [address], [place], [place] [address]    Report Status PENDING  Incomplete  Blood Culture (routine x 2)     Status: None (Preliminary result)   Collection Time: 11/26/23  3:20 AM   Specimen: BLOOD RIGHT ARM  Result Value Ref Range Status   Specimen Description BLOOD RIGHT ARM  Final   Special Requests   Final    BOTTLES DRAWN AEROBIC AND ANAEROBIC Blood Culture results may not be optimal due to an inadequate volume of blood received in culture  bottles   Culture   Final    NO GROWTH 3 DAYS Performed at [facility], [address] [place], [place] [address]    Report Status PENDING  Incomplete  Surgical PCR screen     Status: Abnormal   Collection Time: 11/26/23 12:23 PM   Specimen: Nasal Mucosa; Nasal Swab  Result Value Ref Range Status   MRSA, PCR NEGATIVE NEGATIVE Final   Staphylococcus aureus POSITIVE (A) NEGATIVE Final    Comment: (NOTE) The Xpert SA Assay (FDA approved for NASAL specimens in patients 9 years of age and older), is one component of a comprehensive surveillance program. It is not intended to diagnose infection nor to guide or monitor treatment. Performed at [facility], [address], [place], [place] [address]   Aerobic Culture w Gram Stain (superficial specimen)     Status: Abnormal   Collection Time: 11/26/23  6:48 PM   Specimen: Wound  Result Value Ref Range Status   Specimen Description WOUND  Final   Special Requests NONE  Final   Gram Stain   Final    NO WBC SEEN FEW GRAM POSITIVE COCCI IN PAIRS RARE GRAM NEGATIVE RODS RARE  GRAM POSITIVE RODS Performed at [facility], [address], [place], [place] [address]    Culture MULTIPLE ORGANISMS PRESENT, NONE PREDOMINANT (A)  Final   Report Status 11/29/2023 FINAL  Final  Aerobic/Anaerobic Culture w Gram Stain (surgical/deep wound)     Status: None (Preliminary result)   Collection Time: 11/26/23  6:50 PM   Specimen: Bone; Tissue  Result Value Ref Range Status   Specimen Description BONE  Final   Special Requests B  Final   Gram Stain   Final    NO WBC SEEN FEW GRAM POSITIVE COCCI IN PAIRS RARE GRAM NEGATIVE RODS RARE GRAM POSITIVE RODS    Culture   Final    CULTURE REINCUBATED FOR BETTER GROWTH HOLDING FOR POSSIBLE ANAEROBE Performed at [facility], [address] [place], [place] [address]    Report Status PENDING  Incomplete  Aerobic/Anaerobic Culture w Gram Stain (surgical/deep wound)     Status: Abnormal (Preliminary result)   Collection Time: 11/26/23  6:52 PM   Specimen: Soft Tissue, Other  Result Value Ref Range Status   Specimen Description TISSUE  Final   Special Requests C  Final   Gram Stain   Final    NO WBC SEEN FEW GRAM POSITIVE COCCI IN PAIRS RARE GRAM NEGATIVE RODS RARE GRAM POSITIVE RODS    Culture (A)  Final    MULTIPLE ORGANISMS PRESENT, NONE PREDOMINANT HOLDING FOR POSSIBLE ANAEROBE Performed at [facility], [address] [place], [place] [address]    Report Status PENDING  Incomplete         Radiology Studies: No results found.       Scheduled Meds:  aspirin  EC  81 mg Oral Daily   atorvastatin   40 mg Oral QHS   baclofen   10 mg Oral TID   bisacodyl   10 mg Rectal Q T,Th,S,Su-1800   calcium -vitamin D   1 tablet Oral BID   carvedilol   12.5 mg Oral BID WC   cholecalciferol   1,000 Units Oral Daily   ferrous gluconate   324 mg Oral Q breakfast   furosemide   40 mg Oral BID   hydroxychloroquine   200 mg Oral Daily   lidocaine   1 patch Transdermal Q24H   linaclotide   290 mcg Oral QHS  linezolid   600 mg Oral Q12H   losartan   25 mg Oral Daily   mupirocin  ointment  1 Application Nasal BID   pantoprazole   40 mg Oral Daily   polyethylene glycol  17 g Oral BID   prednisoLONE  acetate  1 drop Left Eye TID   senna-docusate  1 tablet Oral BID   tiZANidine   6 mg Oral TID   Continuous Infusions:  ceFEPime  (MAXIPIME ) IV 2 g (11/29/23 0855)     LOS: 3 days   Time spent:  Haydee Lipa, DO [facility]  If 7PM-7AM, please contact night-coverage [email or web address]  11/29/2023, 1:54 PM

## 2023-11-29 NOTE — Progress Notes (Signed)
 Progress Note  3 Days Post-Op  Subjective: No new complaints.  Objective: Vital signs in last 24 hours: Temp:  [97.6 F (36.4 C)-98.6 F (37 C)] 97.6 F (36.4 C) (05/05 0711) Pulse Rate:  [71-88] 88 (05/05 0711) Resp:  [16-18] 16 (05/05 0711) BP: (100-124)/(53-73) 124/53 (05/05 0711) SpO2:  [97 %-100 %] 100 % (05/05 0711) Last BM Date : 11/29/23  Intake/Output from previous day: 05/04 0701 - 05/05 0700 In: 700 [P.O.:300; IV Piggyback:400] Out: 1375 [Urine:1375] Intake/Output this shift: No intake/output data recorded.  PE: General: pleasant, WD, [name] who is laying in bed in NAD GU: perineal wound clean with some fibrinous tissue in the base but overall looks clean.  Mild milky, thin drainage from vaginal area, does not appear thick or cottage cheese-like Psych: A&Ox3 with an appropriate affect., SP tube present    Lab Results:  Recent Labs    11/27/23 0551 11/28/23 0513  WBC 14.6* 10.2  HGB 8.8* 9.5*  HCT 27.6* 29.9*  PLT 229 259   BMET Recent Labs    11/27/23 0551 11/28/23 0513  NA 130* 136  K 3.5 3.3*  CL 101 104  CO2 20* 21*  GLUCOSE 112* 118*  BUN 16 13  CREATININE 0.97 0.86  CALCIUM  8.6* 8.9   PT/INR No results for input(s): "LABPROT", "INR" in the last 72 hours.  CMP     Component Value Date/Time   NA 136 11/28/2023 0513   NA 138 06/07/2018 1411   K 3.3 (L) 11/28/2023 0513   CL 104 11/28/2023 0513   CO2 21 (L) 11/28/2023 0513   GLUCOSE 118 (H) 11/28/2023 0513   BUN 13 11/28/2023 0513   BUN 13 06/07/2018 1411   CREATININE 0.86 11/28/2023 0513   CREATININE 0.70 05/10/2023 1244   CREATININE 0.58 07/18/2018 1146   CALCIUM  8.9 11/28/2023 0513   PROT 7.2 11/28/2023 0513   ALBUMIN  2.3 (L) 11/28/2023 0513   AST 35 11/28/2023 0513   ALT 44 11/28/2023 0513   ALKPHOS 66 11/28/2023 0513   BILITOT 0.9 11/28/2023 0513   GFRNONAA >60 11/28/2023 0513   GFRNONAA >60 05/10/2023 1244   GFRNONAA 95 07/18/2018 1146   GFRAA 113 09/07/2018  1005   GFRAA 111 07/18/2018 1146   Lipase     Component Value Date/Time   LIPASE 25 08/19/2023 0219       Studies/Results: No results found.   Anti-infectives: Anti-infectives (From admission, onward)    Start     Dose/Rate Route Frequency Ordered Stop   11/29/23 0830  ceFEPIme  (MAXIPIME ) 2 g in sodium chloride  0.9 % 100 mL IVPB        2 g 200 mL/hr over 30 Minutes Intravenous Every 8 hours 11/29/23 0829     11/28/23 2200  linezolid  (ZYVOX ) tablet 600 mg        600 mg Oral Every 12 hours 11/28/23 1219     11/26/23 2200  ceFEPIme  (MAXIPIME ) 2 g in sodium chloride  0.9 % 100 mL IVPB  Status:  Discontinued        2 g 200 mL/hr over 30 Minutes Intravenous Every 12 hours 11/26/23 0723 11/29/23 0829   11/26/23 1415  hydroxychloroquine  (PLAQUENIL ) tablet 200 mg        200 mg Oral Daily 11/26/23 1326     11/26/23 1000  linezolid  (ZYVOX ) IVPB 600 mg  Status:  Discontinued        600 mg 300 mL/hr over 60 Minutes Intravenous Every 12 hours 11/26/23 0704  11/26/23 0705   11/26/23 0730  linezolid  (ZYVOX ) IVPB 600 mg  Status:  Discontinued        600 mg 300 mL/hr over 60 Minutes Intravenous Every 12 hours 11/26/23 0704 11/28/23 1219   11/26/23 0715  ceFEPIme  (MAXIPIME ) 2 g in sodium chloride  0.9 % 100 mL IVPB  Status:  Discontinued        2 g 200 mL/hr over 30 Minutes Intravenous  Once 11/26/23 0706 11/27/23 1427   11/26/23 0245  clindamycin  (CLEOCIN ) IVPB 600 mg        600 mg 100 mL/hr over 30 Minutes Intravenous  Once 11/26/23 4098 11/26/23 0435        Assessment/Plan  Labial/perineal abscess POD 3, s/p I&D Dr. Marny Sires  - recommend BID wet to dry dressing change or PRN if soiled with stool - frequent turning and limit pressure to wound - continue abx, Cxs pending  - wound is overall clean and stable - long discussion regarding diverting colostomy.  Patient doesn't really want at this time and I don't think she really needs it for this wound currently.  If she receives  routine wound care and changes prn saturation, a colostomy should be able to be avoided. -patient is overall surgically stable and could DC when facility determined. -WBC normal and AF  FEN: reg diet VTE: SCDs ID: [email or web address]   LOS: 3 days      [name], [facility] 11/29/2023, 11:16 AM Please see Amion for pager number during day hours 7:00am-4:30pm

## 2023-11-29 NOTE — NC FL2 (Signed)
 [place]  MEDICAID FL2 LEVEL OF CARE FORM     IDENTIFICATION  Patient Name: [name] Birthdate: 1950/10/16 Sex: female Admission Date (Current Location): 11/26/2023  [facility] and [place] Number:  Producer, television/film/video and Address:  [facility], [address], [place], [place] [address]      Provider Number: [id number]  Attending Physician Name and Address:  Haydee Lipa, MD  Relative Name and Phone Number:  Reyes Caul; Sister; [phone]    Current Level of Care: Hospital Recommended Level of Care: Skilled Nursing Facility Prior Approval Number:    Date Approved/Denied:   PASRR Number: [id number]  Discharge Plan: SNF    Current Diagnoses: Patient Active Problem List   Diagnosis Date Noted   Labial abscess 11/26/2023   Dyslipidemia 10/24/2023   Myofascial pain 01/11/2023   Non-healing open wound of left heel 01/11/2023   Memory loss 12/14/2022   Other fatigue 12/14/2022   Carpal tunnel syndrome 06/22/2022   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   Suprapubic catheter (HCC) 09/12/2021   Acute pyelonephritis 09/12/2021   Complicated UTI (urinary tract infection) 09/11/2021   Cellulitis 09/11/2021   AKI (acute kidney injury) (HCC)    Bacteremia due to Klebsiella pneumoniae    UTI (urinary tract infection) 07/23/2021   Laryngopharyngeal reflux 05/20/2021   Dry gangrene (HCC) 04/10/2021   Osteomyelitis (HCC) 04/09/2021   Hiatal hernia with GERD 02/07/2021   Hepatocellular carcinoma (HCC) 02/07/2021   Sepsis secondary to UTI (HCC) 12/08/2020   Spasticity 12/06/2020   Contracture of lower leg joint 12/06/2020   Autonomic dysfunction 08/30/2020   Neurogenic bowel 08/30/2020   Wheelchair dependence 08/30/2020   Medication management 06/07/2018   Chronic cough 06/07/2018   Cirrhosis (HCC) 04/20/2018   Obstipation/fecal impaction 02/12/2018   Intractable vomiting 02/11/2018   Chronic hepatitis C without  hepatic coma (HCC) 01/10/2018   NICM (nonischemic cardiomyopathy) (HCC) 08/25/2017   Normal coronary arteries 08/25/2017   Diastolic dysfunction 08/25/2017   Chronic combined systolic and diastolic CHF (congestive heart failure) (HCC) 08/07/2017   Non-rheumatic mitral regurgitation 08/07/2017   S/P thoracentesis    Status post thoracentesis    S/p percutaneous right heart catheterization    Recurrent right pleural effusion 06/29/2017   Respiratory failure with hypoxia (HCC) 06/29/2017   Pressure injury of skin 06/29/2017   Shortness of breath    Hypokalemia 09/21/2016   Fever in adult 07/28/2016   Decubitus ulcer of coccyx 02/04/2016   Hyperglycemia 12/03/2015   H/O bilateral mastectomy 07/25/2015   Benign hypertensive heart disease without heart failure 07/15/2015   Hiatal hernia 07/06/2015   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   History of breast cancer 08/21/2014   Adynamic ileus (HCC) 07/22/2014   Elevated liver enzymes 07/22/2014   CHF (congestive heart failure) (HCC) 07/05/2014   Gastroparesis 07/05/2014   Iron  deficiency anemia 01/05/2014   Insomnia 07/14/2013   Edema 05/01/2013   Neurogenic bladder 02/23/2013   Paraplegia (HCC) 02/23/2013   Depression 02/23/2013   Allergic rhinitis 02/23/2013   Constipation 02/23/2013   Autoimmune disease (HCC) 10/01/2011   Gastroesophageal reflux disease 10/01/2011   Essential hypertension 10/01/2011   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   Cancer of breast (HCC) 09/08/2011    Orientation RESPIRATION BLADDER Height & Weight     Self, Situation, Time, Place  O2 (2L nasal cannula) Incontinent, Indwelling catheter Weight: 165 lb (74.8 kg) Height:  5\' 6"  (167.6 cm)  BEHAVIORAL SYMPTOMS/MOOD NEUROLOGICAL BOWEL NUTRITION STATUS      Incontinent Diet (Please see discharge summary)  AMBULATORY STATUS COMMUNICATION OF NEEDS Skin    Extensive Assist Verbally Other (Comment) (Incision (Closed) 11/26/23 Perineum Other (Comment))                       Personal Care Assistance Level of Assistance  Bathing, Feeding, Dressing Bathing Assistance: Maximum assistance Feeding assistance: Maximum assistance Dressing Assistance: Maximum assistance     Functional Limitations Info             SPECIAL CARE FACTORS FREQUENCY  PT (By licensed PT), OT (By licensed OT)     PT Frequency: 5x OT Frequency: 5x            Contractures Contractures Info: Not present    Additional Factors Info  Code Status, Allergies, Insulin  Sliding Scale Code Status Info: Full Code Allergies Info: NKA   Insulin  Sliding Scale Info: Please see discharge summary       Current Medications (11/29/2023):  This is the current hospital active medication list Current Facility-Administered Medications  Medication Dose Route Frequency Provider Last Rate Last Admin   acetaminophen  (TYLENOL ) tablet 650 mg  650 mg Oral Q6H PRN Foust, Katy L, NP   650 mg at 11/26/23 2159   Or   acetaminophen  (TYLENOL ) suppository 650 mg  650 mg Rectal Q6H PRN Foust, Katy L, NP       aspirin  EC tablet 81 mg  81 mg Oral Daily Foust, Katy L, NP   81 mg at 11/28/23 1009   atorvastatin  (LIPITOR) tablet 40 mg  40 mg Oral QHS Foust, Katy L, NP   40 mg at 11/28/23 2151   baclofen  (LIORESAL ) tablet 10 mg  10 mg Oral TID Foust, Katy L, NP   10 mg at 11/28/23 2151   bisacodyl  (DULCOLAX) suppository 10 mg  10 mg Rectal Q T,Th,S,Su-1800 Foust, Katy L, NP   10 mg at 11/28/23 1859   calcium -vitamin D  (OSCAL WITH D) 500-5 MG-MCG per tablet 1 tablet  1 tablet Oral BID Foust, Katy L, NP   1 tablet at 11/28/23 2152   carvedilol  (COREG ) tablet 12.5 mg  12.5 mg Oral BID WC Foust, Katy L, NP   12.5 mg at 11/28/23 1652   ceFEPIme  (MAXIPIME ) 2 g in sodium chloride  0.9 % 100 mL IVPB  2 g Intravenous Q8H Reome, Earle J, RPH 200 mL/hr at 11/29/23 0855 2 g at 11/29/23 0855    cholecalciferol  (VITAMIN D3) 25 MCG (1000 UNIT) tablet 1,000 Units  1,000 Units Oral Daily Foust, Katy L, NP   1,000 Units at 11/28/23 1010   ferrous gluconate  (FERGON) tablet 324 mg  324 mg Oral Q breakfast Foust, Katy L, NP   324 mg at 11/28/23 0852   hydroxychloroquine  (PLAQUENIL ) tablet 200 mg  200 mg Oral Daily Foust, Katy L, NP   200 mg at 11/28/23 1010   hydrOXYzine  (ATARAX ) tablet 25 mg  25 mg Oral TID PRN Faith Homes, MD   25 mg at 11/27/23 0355   lidocaine  (LIDODERM ) 5 % 1 patch  1 patch Transdermal Q24H Faith Homes, MD   1 patch at 11/29/23 0451   linaclotide  (LINZESS ) capsule 290 mcg  290 mcg Oral QHS Foust, Katy L, NP   290 mcg at 11/28/23 2150   linezolid  (ZYVOX ) tablet 600 mg  600 mg Oral Q12H Haydee Lipa, MD   600 mg  at 11/28/23 2151   losartan  (COZAAR ) tablet 25 mg  25 mg Oral Daily Foust, Katy L, NP   25 mg at 11/28/23 1010   melatonin tablet 5 mg  5 mg Oral QHS PRN Foust, Katy L, NP   5 mg at 11/26/23 2137   morphine  (PF) 2 MG/ML injection 1 mg  1 mg Intravenous Q2H PRN Foust, Katy L, NP       mupirocin  ointment (BACTROBAN ) 2 % 1 Application  1 Application Nasal BID Haydee Lipa, MD   1 Application at 11/28/23 2152   naloxone (NARCAN) injection 0.4 mg  0.4 mg Intravenous PRN Foust, Katy L, NP       ondansetron  (ZOFRAN ) tablet 4 mg  4 mg Oral Q6H PRN Foust, Katy L, NP   4 mg at 11/26/23 2222   Or   ondansetron  (ZOFRAN ) injection 4 mg  4 mg Intravenous Q6H PRN Foust, Acie Holiday L, NP   4 mg at 11/27/23 2104   oxyCODONE  (Oxy IR/ROXICODONE ) immediate release tablet 5 mg  5 mg Oral Q4H PRN Foust, Katy L, NP   5 mg at 11/28/23 0133   pantoprazole  (PROTONIX ) EC tablet 40 mg  40 mg Oral Daily Foust, Katy L, NP   40 mg at 11/28/23 1009   polyethylene glycol (MIRALAX  / GLYCOLAX ) packet 17 g  17 g Oral BID Foust, Katy L, NP   17 g at 11/28/23 2151   polyvinyl alcohol  (LIQUIFILM TEARS) 1.4 % ophthalmic solution 2 drop  2 drop Both Eyes PRN Reome, Earle J, RPH        prednisoLONE  acetate (PRED FORTE ) 1 % ophthalmic suspension 1 drop  1 drop Left Eye TID Foust, Katy L, NP   1 drop at 11/28/23 2152   senna-docusate (Senokot-S) tablet 1 tablet  1 tablet Oral BID Haydee Lipa, MD   1 tablet at 11/28/23 2151   sucralfate  (CARAFATE ) tablet 1 g  1 g Oral Q6H PRN Reesa Cannon N, DO   1 g at 11/27/23 2020   tiZANidine  (ZANAFLEX ) tablet 6 mg  6 mg Oral TID Foust, Katy L, NP   6 mg at 11/28/23 2151     Discharge Medications: Please see discharge summary for a list of discharge medications.  Relevant Imaging Results:  Relevant Lab Results:   Additional Information ss#[id number]  Juliane Och, LCSW

## 2023-11-29 NOTE — Plan of Care (Signed)
  Problem: Education: Goal: Knowledge of General Education information will improve Description: Including pain rating scale, medication(s)/side effects and non-pharmacologic comfort measures Outcome: Progressing   Problem: Health Behavior/Discharge Planning: Goal: Ability to manage health-related needs will improve Outcome: Progressing   Problem: Clinical Measurements: Goal: Ability to maintain clinical measurements within normal limits will improve Outcome: Progressing Goal: Will remain free from infection Outcome: Progressing Goal: Diagnostic test results will improve Outcome: Progressing Goal: Respiratory complications will improve Outcome: Progressing Goal: Cardiovascular complication will be avoided Outcome: Progressing   Problem: Activity: Goal: Risk for activity intolerance will decrease Outcome: Progressing   Problem: Nutrition: Goal: Adequate nutrition will be maintained Outcome: Progressing   Problem: Coping: Goal: Level of anxiety will decrease Outcome: Progressing   Problem: Elimination: Goal: Will not experience complications related to bowel motility Outcome: Progressing Goal: Will not experience complications related to urinary retention Outcome: Progressing   Problem: Pain Managment: Goal: General experience of comfort will improve and/or be controlled Outcome: Progressing   Problem: Safety: Goal: Ability to remain free from injury will improve Outcome: Progressing   Problem: Skin Integrity: Goal: Risk for impaired skin integrity will decrease Outcome: Not Progressing Big wound to labial area has foul odor drainage. Wound care dressing done.

## 2023-11-29 NOTE — TOC Progression Note (Signed)
 Transition of Care [facility]) - Progression Note    Patient Details  Name: [name] MRN: [id number] Date of Birth: [birth year]  [facility] CM/SW Contact  Juliane Och, LCSW Phone Number: 11/29/2023, 4:15 PM  Clinical Narrative:     4:15 PM CSW returned to patient's bedside and provide sole bed offer [facility]) to patient and patient's sister with SNF Medicare ratings. Patient and patient's sister are to follow up with CSW on LTC SNF decision.  Expected Discharge Plan: Skilled Nursing Facility Barriers to Discharge: Continued Medical Work up, SNF Pending bed offer  Expected Discharge Plan and Services In-house Referral: Clinical Social Work   Post Acute Care Choice: Skilled Nursing Facility Living arrangements for the past 2 months: Skilled Nursing Facility                                       Social Determinants of Health (SDOH) Interventions SDOH Screenings   Food Insecurity: No Food Insecurity (11/26/2023)  Housing: Low Risk  (11/26/2023)  Transportation Needs: No Transportation Needs (11/26/2023)  Utilities: Not At Risk (11/26/2023)  Depression (PHQ2-9): Low Risk  (10/29/2023)  Social Connections: Moderately Isolated (11/26/2023)  Stress: No Stress Concern Present (07/23/2023)   Received from [facility]  Tobacco Use: Medium Risk (11/26/2023)    Readmission Risk Interventions     No data to display

## 2023-11-29 NOTE — TOC Progression Note (Addendum)
 Transition of Care [facility]) - Progression Note    Patient Details  Name: [name] MRN: [id number] Date of Birth: Feb 07, 1951  Transition of Care [facility]) CM/SW Contact  Juliane Och, LCSW Phone Number: 11/29/2023, 9:04 AM  Clinical Narrative:     9:04 AM Per patient request, CSW sent referrals to all SNFs in [place] areas besides [facility] in efforts to obtain a LTC bed.  9:40 AM Per [facility] and [facility], no current LTC bed availability. CSW awaiting decision from [facility], [facility], and [facility] SNF LTC.   Expected Discharge Plan: Skilled Nursing Facility Barriers to Discharge: Continued Medical Work up, SNF Pending bed offer  Expected Discharge Plan and Services In-house Referral: Clinical Social Work   Post Acute Care Choice: Skilled Nursing Facility Living arrangements for the past 2 months: Skilled Nursing Facility                                       Social Determinants of Health (SDOH) Interventions SDOH Screenings   Food Insecurity: No Food Insecurity (11/26/2023)  Housing: Low Risk  (11/26/2023)  Transportation Needs: No Transportation Needs (11/26/2023)  Utilities: Not At Risk (11/26/2023)  Depression (PHQ2-9): Low Risk  (10/29/2023)  Social Connections: Moderately Isolated (11/26/2023)  Stress: No Stress Concern Present (07/23/2023)   Received from [facility]  Tobacco Use: Medium Risk (11/26/2023)    Readmission Risk Interventions     No data to display

## 2023-11-30 DIAGNOSIS — N764 Abscess of vulva: Secondary | ICD-10-CM | POA: Diagnosis not present

## 2023-11-30 LAB — AEROBIC/ANAEROBIC CULTURE W GRAM STAIN (SURGICAL/DEEP WOUND)
Gram Stain: NONE SEEN
Gram Stain: NONE SEEN

## 2023-11-30 MED ORDER — METRONIDAZOLE 500 MG PO TABS
500.0000 mg | ORAL_TABLET | Freq: Two times a day (BID) | ORAL | Status: DC
  Administered 2023-11-30 – 2023-12-01 (×4): 500 mg via ORAL
  Filled 2023-11-30 (×4): qty 1

## 2023-11-30 NOTE — Progress Notes (Signed)
 PROGRESS NOTE    [name]  EPP:[id number] DOB: 12-13-1950 DOA: 11/26/2023 PCP: System, Provider Not In   Brief Narrative:  [name] is a 73 y.o. 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   She presents with worsening labial wound/abscess that is well documented and chronic - unfortunately has been more painful. Recently partially excised 11/23/23 and placed on clindamycin  with worsening systemic features of weakness/malaise, nausea, dizziness and poor appetite.  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 intake is more appropriate - Hold home lasix  and Ditropan  - Avoid nephrotoxins, Contrast Dyes,  Hypotension and Dehydration    Rule out sundowning/delirium, resolved - Patient indicates transient issue of "odd daydreams" but now appear to have resolved.  Chronic neurogenic Bowel, Bladder Chronic constipation - Continue bowel regimen -reports recent BM - Continue Suprapubic catheter   Hypertension - Continue home Coreg  and Losartan     CHF, diastolic - Resume Lasix  - Continue home Coreg  -Most recent echo September 2022 with EF 45-50% with global hypokinesis grade 1 diastolic dysfunction *Patient has not been hypoxic, was placed on oxygen overnight 5/3 in the setting of "comfort" by nursing staff for cough.   Hyperlipidemia CAD - Continue home statin - Continue home aspirin   Rheumatoid Arthritis - Continue home Plaquenil    Chronic Normocytic/iron  deficient anemia -Iron  panel consistently low, discussed supplementation of iron  in the diet as well as over-the-counter iron  tablets   Chronic sacral Decubitis Ulcer, POA - WOCN consult - Pressure offloading, q2H turn Pressure wound:    Constipation, acute on chronic - Increased regimen including MiraLAX  and Senokot, soapsuds enema offered  DVT prophylaxis: SCDs Start: 11/26/23 1106 Code Status:   Code Status: Full Code Family Communication: None present  Status is: Inpatient  Dispo: The patient is from: [facility]              Anticipated d/c is to: Same              Anticipated d/c date is: 24-48 hours              Patient currently not medically stable for discharge  Consultants:  General surgery  Procedures:  Sharp debridement 11/26/23 of labial abscess and phlegmon  Antimicrobials:  Cefepime , linezolid   Subjective: No acute issues or events overnight, pain  currently well-controlled, no further episodes of hallucinations/atypical visions.  Objective: Vitals:   11/29/23 0711 11/29/23 1347 11/29/23 2008 11/30/23 0514  BP: (!) 124/53 110/64 130/75 (!) 155/78  Pulse: 88 73 76 76  Resp: 16 18 16 16   Temp:  97.6 F (36.4 C) 97.9 F (36.6 C) 98.6 F (37 C) 98.9 F (37.2 C)  TempSrc: Oral Oral Oral Oral  SpO2: 100% 94% 98% 96%  Weight:      Height:        Intake/Output Summary (Last 24 hours) at 11/30/2023 0726 Last data filed at 11/30/2023 0516 Gross per 24 hour  Intake 850 ml  Output 1250 ml  Net -400 ml   Filed Weights   11/26/23 0202  Weight: 74.8 kg    Examination:  General:  Pleasantly resting in bed, No acute distress. HEENT:  Normocephalic atraumatic. Neck:  Without mass or deformity.  Trachea is midline. Lungs: Diminished bilaterally without overt wheeze or rales. Heart:  Regular rate and rhythm Abdomen:  Soft, nontender, nondistended.  Without guarding or rebound. GU: Deferred   Data Reviewed: I have personally reviewed following labs and imaging studies  CBC: Recent Labs  Lab 11/26/23 0249 11/27/23 0551 11/28/23 0513  WBC 11.3* 14.6* 10.2  NEUTROABS 9.4*  --   --   HGB 9.9* 8.8* 9.5*  HCT 30.8* 27.6* 29.9*  MCV 80.2 78.9* 79.9*  PLT 262 229 259   Basic Metabolic Panel: Recent Labs  Lab 11/26/23 0249 11/27/23 0551 11/28/23 0513  NA 131* 130* 136  K 3.7 3.5 3.3*  CL 99 101 104  CO2 21* 20* 21*  GLUCOSE 93 112* 118*  BUN 26* 16 13  CREATININE 1.21* 0.97 0.86  CALCIUM  9.2 8.6* 8.9   GFR: Estimated Creatinine Clearance: 60.2 mL/min (by C-G formula based on SCr of 0.86 mg/dL). Liver Function Tests: Recent Labs  Lab 11/26/23 0249 11/27/23 0551 11/28/23 0513  AST 44* 41 35  ALT 32 43 44  ALKPHOS 68 70 66  BILITOT 0.7 1.3* 0.9  PROT 7.6 6.1* 7.2  ALBUMIN  2.5* 2.1* 2.3*   Coagulation Profile: Recent Labs  Lab 11/26/23 0249  INR 1.2   Sepsis Labs: Recent Labs  Lab 11/26/23 0326  LATICACIDVEN 1.2    Recent Results (from the past 240 hours)  Blood Culture (routine x 2)     Status: None (Preliminary result)   Collection Time: 11/26/23  2:12 AM   Specimen: BLOOD  Result Value Ref Range Status   Specimen Description BLOOD SITE NOT  SPECIFIED  Final   Special Requests   Final    BOTTLES DRAWN AEROBIC AND ANAEROBIC Blood Culture results may not be optimal due to an inadequate volume of blood received in culture bottles   Culture   Final    NO GROWTH 3 DAYS Performed at [facility], [address] [place], [place] [address]    Report Status PENDING  Incomplete  Blood Culture (routine x 2)     Status: None (Preliminary result)   Collection Time: 11/26/23  3:20 AM   Specimen: BLOOD RIGHT ARM  Result Value Ref Range Status   Specimen Description BLOOD RIGHT ARM  Final   Special Requests   Final    BOTTLES DRAWN AEROBIC AND ANAEROBIC Blood Culture results may not be optimal due to an inadequate volume of blood received in culture bottles   Culture   Final    NO GROWTH 3 DAYS Performed at [facility], [address] [place], [place] [address]    Report Status PENDING  Incomplete  Surgical PCR screen     Status: Abnormal   Collection Time: 11/26/23 12:23 PM   Specimen: Nasal Mucosa; Nasal Swab  Result Value Ref Range Status   MRSA, PCR NEGATIVE NEGATIVE Final   Staphylococcus aureus POSITIVE (A) NEGATIVE Final    Comment: (NOTE) The Xpert SA Assay (FDA approved for NASAL specimens in patients 47 years of age and older), is one component of a comprehensive surveillance program. It is not intended to diagnose infection nor to guide or monitor treatment. Performed at [facility], [address], [place], [place] [address]   Aerobic Culture w Gram Stain (superficial specimen)     Status: Abnormal   Collection Time: 11/26/23  6:48 PM   Specimen: Wound  Result Value Ref Range Status   Specimen Description WOUND  Final   Special Requests NONE  Final   Gram Stain   Final    NO WBC SEEN FEW GRAM POSITIVE COCCI IN PAIRS RARE GRAM NEGATIVE RODS RARE GRAM POSITIVE RODS Performed at [facility], [address], [place], [place] [address]    Culture MULTIPLE ORGANISMS PRESENT, NONE  PREDOMINANT (A)  Final   Report Status 11/29/2023 FINAL  Final  Aerobic/Anaerobic Culture w Gram Stain (surgical/deep wound)     Status: None (Preliminary result)   Collection Time: 11/26/23  6:50 PM   Specimen: Bone; Tissue  Result Value Ref Range Status   Specimen Description BONE  Final   Special Requests B  Final   Gram Stain   Final    NO WBC SEEN FEW GRAM POSITIVE COCCI IN PAIRS RARE GRAM NEGATIVE RODS RARE GRAM POSITIVE RODS    Culture   Final    RARE STAPHYLOCOCCUS AUREUS MODERATE BACTEROIDES FRAGILIS BETA LACTAMASE POSITIVE Performed at [facility], [address] [name], [place] [address]    Report Status PENDING  Incomplete  Aerobic/Anaerobic Culture w Gram Stain (surgical/deep wound)     Status: None (Preliminary result)   Collection Time: 11/26/23  6:52 PM   Specimen: Soft Tissue, Other  Result Value Ref Range Status   Specimen Description TISSUE  Final   Special Requests C  Final   Gram Stain   Final    NO WBC SEEN FEW GRAM POSITIVE COCCI IN PAIRS RARE GRAM NEGATIVE RODS RARE GRAM POSITIVE RODS    Culture   Final    RARE STAPHYLOCOCCUS AUREUS SUSCEPTIBILITIES TO FOLLOW MODERATE BACTEROIDES SPECIES NOT FRAGILIS BETA LACTAMASE POSITIVE Performed at [facility], [address], [place], [place] [address]    Report Status PENDING  Incomplete         Radiology Studies: No results found.       Scheduled Meds:  aspirin  EC  81 mg Oral Daily   atorvastatin   40 mg Oral QHS   baclofen   10 mg Oral TID   bisacodyl   10 mg Rectal Q T,Th,S,Su-1800   calcium -vitamin D   1 tablet Oral BID   carvedilol   12.5 mg Oral BID WC   cholecalciferol   1,000 Units Oral Daily   ferrous gluconate   324 mg Oral Q breakfast   furosemide   40 mg Oral BID   hydroxychloroquine   200 mg Oral Daily   lidocaine   1 patch Transdermal Q24H   linaclotide   290 mcg Oral QHS   linezolid   600 mg Oral Q12H   losartan   25 mg Oral Daily  mupirocin  ointment  1 Application  Nasal BID   pantoprazole   40 mg Oral Daily   polyethylene glycol  17 g Oral BID   prednisoLONE  acetate  1 drop Left Eye TID   senna-docusate  1 tablet Oral BID   tiZANidine   6 mg Oral TID   Continuous Infusions:  ceFEPime  (MAXIPIME ) IV 2 g (11/30/23 0026)     LOS: 4 days   Time spent:  Haydee Lipa, DO [facility]  If 7PM-7AM, please contact night-coverage [email or web address]  11/30/2023, 7:26 AM

## 2023-11-30 NOTE — Plan of Care (Signed)

## 2023-11-30 NOTE — Care Management Important Message (Signed)
 Important Message  Patient Details  Name: [name] MRN: [id number] Date of Birth: [birth year]   Important Message Given:  Yes - Medicare IM     Wynonia Hedges 11/30/2023, 8:46 AM

## 2023-12-01 DIAGNOSIS — N764 Abscess of vulva: Secondary | ICD-10-CM | POA: Diagnosis not present

## 2023-12-01 LAB — COMPREHENSIVE METABOLIC PANEL WITH GFR
ALT: 30 U/L (ref 0–44)
AST: 20 U/L (ref 15–41)
Albumin: 2.3 g/dL — ABNORMAL LOW (ref 3.5–5.0)
Alkaline Phosphatase: 63 U/L (ref 38–126)
Anion gap: 11 (ref 5–15)
BUN: 9 mg/dL (ref 8–23)
CO2: 22 mmol/L (ref 22–32)
Calcium: 9.5 mg/dL (ref 8.9–10.3)
Chloride: 106 mmol/L (ref 98–111)
Creatinine, Ser: 0.67 mg/dL (ref 0.44–1.00)
GFR, Estimated: >60 mL/min (ref >60)
Glucose, Bld: 86 mg/dL (ref 70–99)
Potassium: 3.7 mmol/L (ref 3.5–5.1)
Sodium: 139 mmol/L (ref 135–145)
Total Bilirubin: 0.7 mg/dL (ref 0.0–1.2)
Total Protein: 7.7 g/dL (ref 6.5–8.1)

## 2023-12-01 LAB — CULTURE, BLOOD (ROUTINE X 2)
Culture: NO GROWTH
Culture: NO GROWTH

## 2023-12-01 LAB — CBC
HCT: 30.5 % — ABNORMAL LOW (ref 36.0–46.0)
Hemoglobin: 9.6 g/dL — ABNORMAL LOW (ref 12.0–15.0)
MCH: 25.1 pg — ABNORMAL LOW (ref 26.0–34.0)
MCHC: 31.5 g/dL (ref 30.0–36.0)
MCV: 79.6 fL — ABNORMAL LOW (ref 80.0–100.0)
Platelets: 322 10*3/uL (ref 150–400)
RBC: 3.83 MIL/uL — ABNORMAL LOW (ref 3.87–5.11)
RDW: 16.1 % — ABNORMAL HIGH (ref 11.5–15.5)
WBC: 7.6 10*3/uL (ref 4.0–10.5)
nRBC: 0 % (ref 0.0–0.2)

## 2023-12-01 MED ORDER — OXYCODONE HCL 5 MG PO TABS: 5.0000 mg | ORAL_TABLET | ORAL | 0 refills | Status: AC | PRN

## 2023-12-01 MED ORDER — LACTULOSE 10 GM/15ML PO SOLN: 10.0000 g | Freq: Two times a day (BID) | ORAL | Status: AC

## 2023-12-01 MED ORDER — DOXYCYCLINE HYCLATE 100 MG PO TABS
100.0000 mg | ORAL_TABLET | Freq: Two times a day (BID) | ORAL | Status: DC
  Administered 2023-12-01 – 2023-12-03 (×5): 100 mg via ORAL
  Filled 2023-12-01 (×5): qty 1

## 2023-12-01 MED ORDER — DOXYCYCLINE HYCLATE 100 MG PO TABS: 100.0000 mg | ORAL_TABLET | Freq: Two times a day (BID) | ORAL | Status: AC

## 2023-12-01 NOTE — Discharge Summary (Signed)
 Physician Discharge Summary  [name]:[id number] DOB: Oct 28, 1950 DOA: 11/26/2023  PCP: System, Provider Not In  Admit date: 11/26/2023 Discharge date: 12/02/2023  Time spent: 45 minutes  Recommendations for Outpatient Follow-up:  Wound care, twice daily wet-to-dry dressing change or as needed if soiled with stool Frequent turning and limit pressure to wound General Surgery follow-up on 5/27 Routine suprapubic catheter care   Discharge Diagnoses:  Principal Problem:   Labial abscess Paraplegia   Essential hypertension   Constipation   Iron  deficiency anemia   CHF (congestive heart failure) (HCC)   Neurogenic bowel   AKI (acute kidney injury) (HCC)   Suprapubic catheter (HCC)   Dyslipidemia   Discharge Condition: Improved  Diet recommendation: Avoid ultra processed food  Filed Weights   11/26/23 0202  Weight: 74.8 kg    History of present illness:  73 y.o. 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   She presented with worsening labial wound/abscess that is well documented and chronic - unfortunately has been more painful. Recently partially excised 11/23/23 and placed on clindamycin  with worsening systemic features of weakness/malaise, nausea, dizziness and poor appetite.    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 -Broad-spectrum antibiotics changed to oral doxycycline for 7-day course -General Surgery follow-up on 5/27   Acute Kidney Injury, resolved  delirium, resolved   Chronic neurogenic Bowel, Bladder Chronic constipation - Now having BMs, continue bowel regimen - Continue Suprapubic catheter, routine catheter care   Hypertension - Continue home Coreg  and Losartan     CHF, diastolic - Remain euvolemic, continue home regimen of Coreg  and Lasix  -Most recent echo September 2022 with EF 45-50% with global hypokinesis grade 1 diastolic dysfunction   Hyperlipidemia CAD - Continue aspirin , statin   Rheumatoid Arthritis - Continue home Plaquenil    Chronic Normocytic/iron  deficient anemia -Iron  panel consistently low, discussed supplementation of iron  in the diet as well as over-the-counter iron  tablets   Chronic sacral Decubitis Ulcer, POA - WOCN consulted - Pressure offloading, q2H turn   Constipation, acute on chronic - Increased regimen including MiraLAX  and Senokot,     Consultants:  General surgery   Procedures:  Sharp debridement 11/26/23 of labial abscess and phlegmon    Discharge Exam: Vitals:   12/02/23 0333 12/02/23 0802  BP: (!) 152/65 (!) 128/50  Pulse: 80 (!) 55  Resp: 16   Temp: 98.1 F (36.7 C) 98 F (36.7 C)  SpO2: 100% 96%    Discharge Instructions   Discharge Instructions     Ambulatory referral to Allergy   Complete by: As directed       Allergies as of 12/02/2023   No Known Allergies      Medication List     STOP taking these medications    Carboxymethylcellulose Sod PF 1 % Gel   diphenhydramine -calamine 1-8 % Lotn Commonly known as: CALOHIST   erythromycin ophthalmic ointment  hydroquinone 2 % cream   traMADol  50 MG tablet Commonly known as: ULTRAM        TAKE these medications    acetaminophen  325 MG  tablet Commonly known as: TYLENOL  Take 650 mg by mouth every 6 (six) hours as needed.   albuterol  108 (90 Base) MCG/ACT inhaler Commonly known as: VENTOLIN  HFA Inhale 2 puffs into the lungs every 4 (four) hours as needed for wheezing or shortness of breath.   alum & mag hydroxide-simeth 400-400-40 MG/5ML suspension Commonly known as: MAALOX PLUS Take 10 mLs by mouth every 6 (six) hours as needed for indigestion, heartburn or flatulence (nausea).   aspirin  EC 81 MG tablet Take 81 mg by mouth daily. Swallow whole.   atorvastatin  40 MG tablet Commonly known as: Lipitor Take 1 tablet (40 mg total) by mouth daily. What changed: when to take this   baclofen  10 MG tablet Commonly known as: LIORESAL  Take 1 tablet (10 mg total) by mouth 3 (three) times daily.   Benzocaine 10 MG Lozg Use as directed 1 lozenge in the mouth or throat every 2 (two) hours as needed (sore throat).   bisacodyl  10 MG suppository Commonly known as: DULCOLAX Place 1 suppository (10 mg total) rectally every Tuesday, Thursday, Saturday, and Sunday at 6 PM.   calcium -vitamin D  500-200 MG-UNIT tablet Commonly known as: OSCAL WITH D Take 1 tablet by mouth 2 (two) times daily.   carvedilol  12.5 MG tablet Commonly known as: COREG  Take 1 tablet (12.5 mg total) by mouth 2 (two) times daily with a meal. Hold for SBP <100 What changed: additional instructions   cetirizine 10 MG tablet Commonly known as: ZYRTEC Take 10 mg by mouth daily as needed for allergies.   cholecalciferol  1000 units tablet Commonly known as: VITAMIN D  Take 1,000 Units by mouth daily.   Cranberry 450 MG Tabs Take 450 mg by mouth daily.   diclofenac  Sodium 1 % Gel Commonly known as: VOLTAREN  Apply 2 g topically every 6 (six) hours as needed (pain). Apply to bilateral shoulders   diphenhydrAMINE  25 mg capsule Commonly known as: BENADRYL  Take 50 mg by mouth every 6 (six) hours as needed for itching.   doxycycline 100 MG  tablet Commonly known as: VIBRA-TABS Take 1 tablet (100 mg total) by mouth every 12 (twelve) hours for 6 days.   ferrous gluconate  324 MG tablet Commonly known as: FERGON Take 324 mg by mouth daily with breakfast.   furosemide  40 MG tablet Commonly known as: LASIX  Take 40 mg by mouth 2 (two) times daily.   guaifenesin  100 MG/5ML syrup Commonly known as: ROBITUSSIN Take 200 mg by mouth every 4 (four) hours as needed for cough.   hydrocortisone  2.5 % lotion Apply 1 application. topically every 12 (twelve) hours as needed (itching of arms and legs).   hydroxychloroquine  200 MG tablet Commonly known as: PLAQUENIL  Take 200 mg by mouth daily.   hydrOXYzine  25 MG tablet Commonly known as: ATARAX  Take 50 mg by mouth every 6 (six) hours as needed for itching.   ipratropium-albuterol  0.5-2.5 (3) MG/3ML Soln Commonly known as: DUONEB Inhale 3 mLs into the lungs every 6 (six) hours as needed.   lactulose  10 GM/15ML solution Commonly known as: CHRONULAC  Take 15 mLs (10 g total) by mouth 2 (two) times daily. What changed:  when to take this reasons to take this   leptospermum manuka honey Pste paste Apply 1 Application topically daily.   lidocaine  5 % ointment Commonly known as: XYLOCAINE  Apply 1  Application topically as needed.   Linzess  290 MCG Caps capsule Generic drug: linaclotide  Take 290 mcg by mouth at bedtime.   losartan  25 MG tablet Commonly known as: COZAAR  Take 1 tablet (25 mg total) by mouth daily.   Lumify  0.025 % Soln Generic drug: Brimonidine  Tartrate Place 1 drop into both eyes daily as needed (redness).   melatonin 3 MG Tabs tablet Take 3 mg by mouth at bedtime as needed.   omeprazole  20 MG capsule Commonly known as: PRILOSEC Take 20 mg by mouth 2 (two) times daily.   ondansetron  4 MG tablet Commonly known as: ZOFRAN  Take 4 mg by mouth every 8 (eight) hours as needed for nausea or vomiting.   oxybutynin  10 MG 24 hr tablet Commonly known as:  DITROPAN -XL Take 10 mg by mouth daily.   oxyCODONE  5 MG immediate release tablet Commonly known as: Oxy IR/ROXICODONE  Take 1 tablet (5 mg total) by mouth every 4 (four) hours as needed for moderate pain (pain score 4-6).   oxymetazoline 0.05 % nasal spray Commonly known as: AFRIN Place 2 sprays into both nostrils 2 (two) times daily as needed (use at the start of nosebleed).   polyethylene glycol 17 g packet Commonly known as: MIRALAX  / GLYCOLAX  Take 17 g by mouth daily. Increase to twice a day as needed What changed:  when to take this additional instructions   prednisoLONE  acetate 1 % ophthalmic suspension Commonly known as: PRED FORTE  Place 1 drop into the left eye in the morning, at noon, and at bedtime.   Prenatal 19 29-1 MG Tabs Take 1 tablet by mouth daily at 6 (six) AM.   Simethicone  125 MG Tabs Take 125 mg by mouth every 8 (eight) hours as needed (gas pain).   sodium chloride  0.65 % Soln nasal spray Commonly known as: OCEAN Place 1 spray into both nostrils every 2 (two) hours as needed for congestion.   sucralfate  1 g tablet Commonly known as: CARAFATE  Take 1 g by mouth every 6 (six) hours as needed (GERD).   SYSTANE COMPLETE OP Place 2 drops into both eyes every 6 (six) hours as needed (Dry eyes).   tiZANidine  4 MG tablet Commonly known as: ZANAFLEX  Take 6 mg by mouth 3 (three) times daily.   triamcinolone cream 0.1 % Commonly known as: KENALOG Apply 1 application  topically as needed.   Tussin DM 10-100 MG/5ML liquid Generic drug: dextromethorphan -guaiFENesin  Take 15 mLs by mouth every 4 (four) hours as needed for cough.   zinc  sulfate (50mg  elemental zinc ) 220 (50 Zn) MG capsule Take 220 mg by mouth daily.       No Known Allergies  Follow-up Information     Maczis, Puja Gosai, PA-C Follow up on 12/21/2023.   Specialty: General Surgery Why: 1:30pm, Arrive 30 minutes prior to your appointment time, Please bring your insurance card and photo  ID Contact information: [address] [facility] [place] [address] [phone]                  The results of significant diagnostics from this hospitalization (including imaging, microbiology, ancillary and laboratory) are listed below for reference.    Significant Diagnostic Studies: CT ABDOMEN PELVIS W CONTRAST Result Date: 11/26/2023 CLINICAL DATA:  Sepsis. Evaluate for labial abscess, sacral osteomyelitis. EXAM: CT ABDOMEN AND PELVIS WITH CONTRAST TECHNIQUE: Multidetector CT imaging of the abdomen and pelvis was performed using the standard protocol following bolus administration of intravenous contrast. RADIATION DOSE REDUCTION: This  exam was performed according to the departmental dose-optimization program which includes automated exposure control, adjustment of the mA and/or kV according to patient size and/or use of iterative reconstruction technique. CONTRAST:  75mL OMNIPAQUE  IOHEXOL  350 MG/ML SOLN COMPARISON:  Numerous prior CTs back to 2009. The 2 most recent are both with for contrast dated 05/10/2023 and 08/19/2023. FINDINGS: Lower chest: Posterior atelectasis again noted on right-greater-than-left. Chronic elevated right diaphragm. No new abnormality in the lung bases. Moderate-sized hiatal hernia again noted. The cardiac size is normal. Calcification in the left main coronary artery. No pericardial fluid. Hepatobiliary: Stable 3.9 x 2.1 cm mixed density lesion in posterior aspect of the liver in segment 6 is again noted, unchanged consistent with previously reported ablation defect. Is 20 cm length and slightly steatotic without other focal abnormality. Gallbladder is absent without biliary dilatation. Pancreas: No abnormality. Spleen: 8.5 cm complex densely rim calcified cystic lesion inferior aspect of the spleen is unchanged, likely due to remote trauma or infection. The remainder of the spleen is unremarkable. Adrenals/Urinary Tract: Stable  mild nodular thickening the adrenal glands. Multiple unchanged small subcentimeter Bosniak 2 renal cortical cysts which are too small to characterize. No follow-up imaging is recommended. There is no urinary stone or obstruction. The bladder catheterized, contracted and not well seen, with suprapubic catheter in place. Stomach/Bowel: No bowel obstruction or inflammatory changes. Hiatal hernia described above. An appendix is not seen. There is moderate retained stool ascending and transverse colon. Small anorectal prolapse. Vascular/Lymphatic: Moderate aortoiliac calcific plaque. No AAA. Increased right inguinal chain adenopathy up to 1.6 cm short axis. Increasingly prominent right external iliac chain nodes to 1.9 cm, increased left external iliac chain nodes to 1.2 cm. No abdominal adenopathy is seen. Reproductive: Fibroid uterus with calcified fibroids, largest is 8.8 cm. No adnexal mass. Other: No free fluid or incarcerated hernia. Musculoskeletal: Vaginal labial swelling is noted especially on the right where there is a complex hypodense collection measuring 5.2 x 2.9 cm on 3:100, 31 Hounsfield units, consistent with abscess or phlegmon. Decubitus changes are again noted about the right ischial tuberosity with deformity. There is subcutaneous patchy soft tissue gas underlying this and extending over towards the right side of the perineum consistent with a gas-forming infection. Underlying osteomyelitis is difficult to exclude as well as an early presentation of Fournier's gangrene. No underlying destructive change to the bone is seen and no loculated collection except in the right labia. Again noted are chronic fracture nonunions in both femoral necks, with moderate granulation tissue. Advanced degenerative change lumbar spine with interbody ankylosis multiple levels. IMPRESSION: 1. 5.2 x 2.9 cm complex hypodense collection in the right labia consistent with abscess or phlegmon. 2. Decubitus changes about the  right ischial tuberosity with deformity. There is subcutaneous patchy soft tissue gas underlying this and extending over towards the right side of the perineum consistent with a gas-forming infection. Underlying osteomyelitis is difficult to exclude as well as an early presentation of Fournier's gangrene. 3. Increased right inguinal and right greater left external iliac chain adenopathy is likely reactive. 4. Constipation. 5. Hiatal hernia. 6. Aortic and coronary artery atherosclerosis. 7. Stable 3.9 x 2.1 cm mixed density lesion in the posterior aspect of the liver in segment 6 consistent with previously reported ablation defect. 8. Fibroid uterus. 9. 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 Aortic Atherosclerosis (ICD10-I70.0). Electronically Signed   By: Denman Fischer M.D.   On: 11/26/2023 06:35    Microbiology: Recent Results (from the past 240 hours)  Blood Culture (routine x 2)     Status: None   Collection Time: 11/26/23  2:12 AM   Specimen: BLOOD  Result Value Ref Range Status   Specimen Description BLOOD SITE NOT SPECIFIED  Final   Special Requests   Final    BOTTLES DRAWN AEROBIC AND ANAEROBIC Blood Culture results may not be optimal due to an inadequate volume of blood received in culture bottles   Culture   Final    NO GROWTH 5 DAYS Performed at [facility], [address], [place], [place] [address]    Report Status 12/01/2023 FINAL  Final  Blood Culture (routine x 2)     Status: None   Collection Time: 11/26/23  3:20 AM   Specimen: BLOOD RIGHT ARM  Result Value Ref Range Status   Specimen Description BLOOD RIGHT ARM  Final   Special Requests   Final    BOTTLES DRAWN AEROBIC AND ANAEROBIC Blood Culture results may not be optimal due to an inadequate volume of blood received in culture bottles   Culture   Final    NO GROWTH 5 DAYS Performed at [facility], [address] [place], [place] [address]    Report Status 12/01/2023 FINAL  Final  Surgical PCR  screen     Status: Abnormal   Collection Time: 11/26/23 12:23 PM   Specimen: Nasal Mucosa; Nasal Swab  Result Value Ref Range Status   MRSA, PCR NEGATIVE NEGATIVE Final   Staphylococcus aureus POSITIVE (A) NEGATIVE Final    Comment: (NOTE) The Xpert SA Assay (FDA approved for NASAL specimens in patients 29 years of age and older), is one component of a comprehensive surveillance program. It is not intended to diagnose infection nor to guide or monitor treatment. Performed at [facility], [address], [place], [place] [address]   Aerobic Culture w Gram Stain (superficial specimen)     Status: Abnormal   Collection Time: 11/26/23  6:48 PM   Specimen: Wound  Result Value Ref Range Status   Specimen Description WOUND  Final   Special Requests NONE  Final   Gram Stain   Final    NO WBC SEEN FEW GRAM POSITIVE COCCI IN PAIRS RARE GRAM NEGATIVE RODS RARE GRAM POSITIVE RODS Performed at [facility], [address], [place], [place] [address]    Culture MULTIPLE ORGANISMS PRESENT, NONE PREDOMINANT (A)  Final   Report Status 11/29/2023 FINAL  Final  Aerobic/Anaerobic Culture w Gram Stain (surgical/deep wound)     Status: None   Collection Time: 11/26/23  6:50 PM   Specimen: Bone; Tissue  Result Value Ref Range Status   Specimen Description BONE  Final   Special Requests B  Final   Gram Stain   Final    NO WBC SEEN FEW GRAM POSITIVE COCCI IN PAIRS RARE GRAM NEGATIVE RODS RARE GRAM POSITIVE RODS    Culture   Final    RARE STAPHYLOCOCCUS AUREUS SUSCEPTIBILITIES PERFORMED ON PREVIOUS CULTURE WITHIN THE LAST 5 DAYS. MODERATE BACTEROIDES FRAGILIS BETA LACTAMASE POSITIVE Performed at [facility], [name], [place], [place] [address]    Report Status 11/30/2023 FINAL  Final  Aerobic/Anaerobic Culture w Gram Stain (surgical/deep wound)     Status: None   Collection Time: 11/26/23  6:52 PM   Specimen: Soft Tissue, Other  Result Value Ref Range Status    Specimen Description TISSUE  Final   Special Requests C  Final  Gram Stain   Final    NO WBC SEEN FEW GRAM POSITIVE COCCI IN PAIRS RARE GRAM NEGATIVE RODS RARE GRAM POSITIVE RODS    Culture   Final    RARE METHICILLIN RESISTANT STAPHYLOCOCCUS AUREUS WITHIN MIXED AEROBIC ORGANISMS MODERATE BACTEROIDES SPECIES NOT FRAGILIS BETA LACTAMASE POSITIVE Performed at [facility], [address], [place], [place] [address]    Report Status 11/30/2023 FINAL  Final   Organism ID, Bacteria METHICILLIN RESISTANT STAPHYLOCOCCUS AUREUS  Final      Susceptibility   Methicillin resistant staphylococcus aureus - MIC*    CIPROFLOXACIN >=8 RESISTANT Resistant     ERYTHROMYCIN >=8 RESISTANT Resistant     GENTAMICIN <=0.5 SENSITIVE Sensitive     OXACILLIN >=4 RESISTANT Resistant     TETRACYCLINE <=1 SENSITIVE Sensitive     VANCOMYCIN  <=0.5 SENSITIVE Sensitive     TRIMETH /SULFA  <=10 SENSITIVE Sensitive     CLINDAMYCIN  >=8 RESISTANT Resistant     RIFAMPIN <=0.5 SENSITIVE Sensitive     Inducible Clindamycin  NEGATIVE Sensitive     LINEZOLID  4 SENSITIVE Sensitive     * RARE METHICILLIN RESISTANT STAPHYLOCOCCUS AUREUS     Labs: Basic Metabolic Panel: Recent Labs  Lab 11/26/23 0249 11/27/23 0551 11/28/23 0513 12/01/23 0440  NA 131* 130* 136 139  K 3.7 3.5 3.3* 3.7  CL 99 101 104 106  CO2 21* 20* 21* 22  GLUCOSE 93 112* 118* 86  BUN 26* 16 13 9   CREATININE 1.21* 0.97 0.86 0.67  CALCIUM  9.2 8.6* 8.9 9.5   Liver Function Tests: Recent Labs  Lab 11/26/23 0249 11/27/23 0551 11/28/23 0513 12/01/23 0440  AST 44* 41 35 20  ALT 32 43 44 30  ALKPHOS 68 70 66 63  BILITOT 0.7 1.3* 0.9 0.7  PROT 7.6 6.1* 7.2 7.7  ALBUMIN  2.5* 2.1* 2.3* 2.3*   No results for input(s): "LIPASE", "AMYLASE" in the last 168 hours. No results for input(s): "AMMONIA" in the last 168 hours. CBC: Recent Labs  Lab 11/26/23 0249 11/27/23 0551 11/28/23 0513 12/01/23 0440  WBC 11.3* 14.6* 10.2 7.6  NEUTROABS  9.4*  --   --   --   HGB 9.9* 8.8* 9.5* 9.6*  HCT 30.8* 27.6* 29.9* 30.5*  MCV 80.2 78.9* 79.9* 79.6*  PLT 262 229 259 322   Cardiac Enzymes: No results for input(s): "CKTOTAL", "CKMB", "CKMBINDEX", "TROPONINI" in the last 168 hours. BNP: BNP (last 3 results) No results for input(s): "BNP" in the last 8760 hours.  ProBNP (last 3 results) No results for input(s): "PROBNP" in the last 8760 hours.  CBG: Recent Labs  Lab 11/28/23 2309  GLUCAP 147*       Signed:  Deforest Fast MD.  [facility] 12/02/2023, 1:46 PM

## 2023-12-01 NOTE — Plan of Care (Signed)
 Pt at bedrest. Alert and oriented x4. Repositioned and with pillows. S/p cath intact and patent with lt amber urine. Tolerating po intake. No c/o pain at this time. Encouraged to call for assistance as needed. Call light in reach. Sr x3 elevated. Bed in low position,
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
## 2023-12-01 NOTE — TOC Progression Note (Addendum)
 Transition of Care [facility]) - Progression Note    Patient Details  Name: [name] MRN: [id number] Date of Birth: [birth year]  Transition of [facility] CM/SW Contact  Juliane Och, LCSW Phone Number: 12/01/2023, 10:20 AM  Clinical Narrative:     10:20 AM Patient's sister, Felipa Horsfall, called CSW regarding SNF. CSW attempted to call Felipa Horsfall, but there was no response and a voicemail was left. CSW followed up with patient at bedside regarding SNF. Patient inquired about waitlist status at [facility] prior to deciding whether or not to discharge to [facility]. CSW relayed inquiry to [facility] who are to follow up with this CSW. CSW answer patient's questions regarding new SNF LTC admission. Patient expressed preference in discharging Friday.  4:03 PM [facility] has yet to inform this CSW of patient's waitlist status. CSW relayed information to patient and patient's sister, Felipa Horsfall. Patient consented CSW to accept bed offer at [facility]. CSW relayed acceptance to [facility]. CSW to submit insurance authorization upon physical therapy progress note.  4:58 PM Insurance authorization for [facility] has been submitted and is currently pending (ID [id number]).  Expected Discharge Plan: Skilled Nursing Facility Barriers to Discharge: Continued Medical Work up, SNF Pending bed offer  Expected Discharge Plan and Services In-house Referral: Clinical Social Work   Post Acute Care Choice: Skilled Nursing Facility Living arrangements for the past 2 months: Skilled Nursing Facility                                       Social Determinants of Health (SDOH) Interventions SDOH Screenings   Food Insecurity: No Food Insecurity (11/26/2023)  Housing: Low Risk  (11/26/2023)  Transportation Needs: No Transportation Needs (11/26/2023)  Utilities: Not At Risk (11/26/2023)  Depression (PHQ2-9): Low Risk  (10/29/2023)  Social Connections: Moderately Isolated (11/26/2023)  Stress: No  Stress Concern Present (07/23/2023)   Received from [facility]  Tobacco Use: Medium Risk (11/26/2023)    Readmission Risk Interventions     No data to display

## 2023-12-01 NOTE — Progress Notes (Signed)
 Drsg changed to right labial. Bed of ulcer pink and moist; minimal drainage noted.

## 2023-12-02 MED ORDER — LIDOCAINE 5 % EX PTCH
1.0000 | MEDICATED_PATCH | Freq: Every day | CUTANEOUS | Status: DC | PRN
  Administered 2023-12-03: 1 via TRANSDERMAL
  Filled 2023-12-02: qty 1

## 2023-12-02 MED ORDER — SODIUM CHLORIDE 0.9 % IV BOLUS
500.0000 mL | Freq: Once | INTRAVENOUS | Status: AC
Start: 2023-12-02 — End: 2023-12-02
  Administered 2023-12-02: 500 mL via INTRAVENOUS

## 2023-12-02 MED ORDER — SODIUM CHLORIDE 0.9 % IV BOLUS: 500.0000 mL | Freq: Once | INTRAVENOUS | Status: DC

## 2023-12-02 MED ORDER — MEDIHONEY WOUND/BURN DRESSING EX PSTE: 1.0000 | PASTE | Freq: Every day | CUTANEOUS | Status: DC

## 2023-12-02 MED ORDER — MEDIHONEY WOUND/BURN DRESSING EX PSTE
1.0000 | PASTE | Freq: Every day | CUTANEOUS | Status: DC
  Administered 2023-12-02 – 2023-12-03 (×2): 1 via TOPICAL
  Filled 2023-12-02: qty 44

## 2023-12-02 NOTE — Plan of Care (Signed)

## 2023-12-02 NOTE — Progress Notes (Signed)
 Progress Note  6 Days Post-Op  Subjective: No new complaints regarding her wound today  Objective: Vital signs in last 24 hours: Temp:  [97.9 F (36.6 C)-98.1 F (36.7 C)] 98 F (36.7 C) (05/08 0802) Pulse Rate:  [55-80] 55 (05/08 0802) Resp:  [16-18] 16 (05/08 0333) BP: (94-152)/(50-65) 128/50 (05/08 0802) SpO2:  [94 %-100 %] 96 % (05/08 0802) Last BM Date : 11/29/23  Intake/Output from previous day: 05/07 0701 - 05/08 0700 In: -  Out: 700 [Urine:700] Intake/Output this shift: No intake/output data recorded.  PE: General: NAD GU: perineal wound clean with some fibrinous, dark tissue in the in the posterior base aspect of the wound, otherwise wound is clean.  Mild milky, thin drainage from vaginal area is stable, does not appear thick or cottage cheese-like Psych: A&Ox3 with an appropriate affect   Lab Results:  Recent Labs    12/01/23 0440  WBC 7.6  HGB 9.6*  HCT 30.5*  PLT 322   BMET Recent Labs    12/01/23 0440  NA 139  K 3.7  CL 106  CO2 22  GLUCOSE 86  BUN 9  CREATININE 0.67  CALCIUM  9.5   PT/INR No results for input(s): "LABPROT", "INR" in the last 72 hours.  CMP     Component Value Date/Time   NA 139 12/01/2023 0440   NA 138 06/07/2018 1411   K 3.7 12/01/2023 0440   CL 106 12/01/2023 0440   CO2 22 12/01/2023 0440   GLUCOSE 86 12/01/2023 0440   BUN 9 12/01/2023 0440   BUN 13 06/07/2018 1411   CREATININE 0.67 12/01/2023 0440   CREATININE 0.70 05/10/2023 1244   CREATININE 0.58 07/18/2018 1146   CALCIUM  9.5 12/01/2023 0440   PROT 7.7 12/01/2023 0440   ALBUMIN  2.3 (L) 12/01/2023 0440   AST 20 12/01/2023 0440   ALT 30 12/01/2023 0440   ALKPHOS 63 12/01/2023 0440   BILITOT 0.7 12/01/2023 0440   GFRNONAA >60 12/01/2023 0440   GFRNONAA >60 05/10/2023 1244   GFRNONAA 95 07/18/2018 1146   GFRAA 113 09/07/2018 1005   GFRAA 111 07/18/2018 1146   Lipase     Component Value Date/Time   LIPASE 25 08/19/2023 0219        Studies/Results: No results found.   Anti-infectives: Anti-infectives (From admission, onward)    Start     Dose/Rate Route Frequency Ordered Stop   12/01/23 1115  doxycycline (VIBRA-TABS) tablet 100 mg        100 mg Oral Every 12 hours 12/01/23 1016 12/08/23 0959   12/01/23 0000  doxycycline (VIBRA-TABS) 100 MG tablet        100 mg Oral Every 12 hours 12/01/23 1020 12/07/23 2359   11/30/23 1145  metroNIDAZOLE  (FLAGYL ) tablet 500 mg  Status:  Discontinued        500 mg Oral Every 12 hours 11/30/23 1056 12/02/23 0903   11/29/23 0830  ceFEPIme  (MAXIPIME ) 2 g in sodium chloride  0.9 % 100 mL IVPB  Status:  Discontinued        2 g 200 mL/hr over 30 Minutes Intravenous Every 8 hours 11/29/23 0829 12/01/23 1016   11/28/23 2200  linezolid  (ZYVOX ) tablet 600 mg  Status:  Discontinued        600 mg Oral Every 12 hours 11/28/23 1219 12/01/23 1018   11/26/23 2200  ceFEPIme  (MAXIPIME ) 2 g in sodium chloride  0.9 % 100 mL IVPB  Status:  Discontinued        2 g  200 mL/hr over 30 Minutes Intravenous Every 12 hours 11/26/23 0723 11/29/23 0829   11/26/23 1415  hydroxychloroquine  (PLAQUENIL ) tablet 200 mg        200 mg Oral Daily 11/26/23 1326     11/26/23 1000  linezolid  (ZYVOX ) IVPB 600 mg  Status:  Discontinued        600 mg 300 mL/hr over 60 Minutes Intravenous Every 12 hours 11/26/23 0704 11/26/23 0705   11/26/23 0730  linezolid  (ZYVOX ) IVPB 600 mg  Status:  Discontinued        600 mg 300 mL/hr over 60 Minutes Intravenous Every 12 hours 11/26/23 0704 11/28/23 1219   11/26/23 0715  ceFEPIme  (MAXIPIME ) 2 g in sodium chloride  0.9 % 100 mL IVPB  Status:  Discontinued        2 g 200 mL/hr over 30 Minutes Intravenous  Once 11/26/23 0706 11/27/23 1427   11/26/23 0245  clindamycin  (CLEOCIN ) IVPB 600 mg        600 mg 100 mL/hr over 30 Minutes Intravenous  Once 11/26/23 1610 11/26/23 0435        Assessment/Plan  Labial/perineal abscess POD 6, s/p I&D Dr. Marny Sires  - recommend BID  wet to dry dressing change or PRN if soiled with stool.  Will add medihoney to posterior aspect of wound - frequent turning and limit pressure to wound - CX with MRSA.  Rec 7-10 days total - wound is overall clean and stable -patient is overall surgically stable and could DC when facility determined. -WBC normal and AF  FEN: reg diet VTE: SCDs ID: [email or web address]   LOS: 6 days      Leone Ralphs, [facility] 12/02/2023, 10:10 AM Please see Amion for pager number during day hours 7:00am-4:30pm

## 2023-12-02 NOTE — Progress Notes (Signed)
 While preparing patient for discharge, she voiced concerns about leaving and stated that she would like to appeal her d/c. Pt stated that she wants to stay another night due to her now not feeling well. Pt is agreeable to leaving tomorrow. Attempted to reach out to case management, still awaiting response. MD notified.   Dedra Fantasia, BSN RN
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
## 2023-12-02 NOTE — TOC Transition Note (Signed)
 Transition of Care [facility]) - Discharge Note   Patient Details  Name: [name] MRN: [id number] Date of Birth: 27-Sep-1950  Transition of Care [facility]) CM/SW Contact:  Juliane Och, LCSW Phone Number: 12/02/2023, 2:53 PM   Clinical Narrative:     [facility] Anticipated DC date: 12/02/2023 Family notified: Reyes Caul; Sister; [phone] Transport by: Lyna Sandhoff   Per MD patient ready for DC to [facility] LTC. RN to call report prior to discharge [phone]). RN, patient, patient's family, and facility notified of DC. Discharge Summary and FL2 sent to facility. DC packet on chart. Ambulance transport requested for patient at 14:53.   CSW will sign off for now as social work intervention is no longer needed. Please consult us  again if new needs arise.    Final next level of care: Skilled Nursing Facility Barriers to Discharge: Barriers Resolved   Patient Goals and CMS Choice Patient states their goals for this hospitalization and ongoing recovery are:: LTC SNF CMS Medicare.gov Compare Post Acute Care list provided to:: Patient Choice offered to / list presented to : Patient      Discharge Placement              Patient chooses bed at: [facility] Patient to be transferred to facility by: [facility] Name of family member notified: Reyes Caul; Daughter; [phone] Patient and family notified of of transfer: 12/02/23  Discharge Plan and Services Additional resources added to the After Visit Summary for   In-house Referral: Clinical Social Work   Post Acute Care Choice: Skilled Nursing Facility                               Social Drivers of Health (SDOH) Interventions SDOH Screenings   Food Insecurity: No Food Insecurity (11/26/2023)  Housing: Low Risk  (11/26/2023)  Transportation Needs: No Transportation Needs (11/26/2023)  Utilities: Not At Risk (11/26/2023)  Depression (PHQ2-9): Low Risk  (10/29/2023)   Social Connections: Moderately Isolated (11/26/2023)  Stress: No Stress Concern Present (07/23/2023)   Received from [facility]  Tobacco Use: Medium Risk (11/26/2023)     Readmission Risk Interventions     No data to display

## 2023-12-02 NOTE — Progress Notes (Signed)
 Pt seen, no changes from my DC summary yesterday - She has had ongoing issues with itching and intermittent rashes for several months, referral will be sent to allergist per patient request -Anticipate DC to SNF soon  [employer], MD

## 2023-12-02 NOTE — TOC Progression Note (Signed)
 Transition of Care [facility]) - Progression Note    Patient Details  Name: CHERELL MARKE MRN: [id number] Date of Birth: [birth year]  Transition of Care [facility]) CM/SW Contact  Juliane Och, LCSW Phone Number: 12/02/2023, 11:35 AM  Clinical Narrative:     11:35 AM SNF insurance authorization has been approved and is valid 05/08-05/12. CSW informed SNF who confirmed bed availability for patient discharge today. CSW relayed information to medical team.  Expected Discharge Plan: Skilled Nursing Facility Barriers to Discharge: Continued Medical Work up, SNF Pending bed offer  Expected Discharge Plan and Services In-house Referral: Clinical Social Work   Post Acute Care Choice: Skilled Nursing Facility Living arrangements for the past 2 months: Skilled Nursing Facility                                       Social Determinants of Health (SDOH) Interventions SDOH Screenings   Food Insecurity: No Food Insecurity (11/26/2023)  Housing: Low Risk  (11/26/2023)  Transportation Needs: No Transportation Needs (11/26/2023)  Utilities: Not At Risk (11/26/2023)  Depression (PHQ2-9): Low Risk  (10/29/2023)  Social Connections: Moderately Isolated (11/26/2023)  Stress: No Stress Concern Present (07/23/2023)   Received from [facility]  Tobacco Use: Medium Risk (11/26/2023)    Readmission Risk Interventions     No data to display

## 2023-12-03 DIAGNOSIS — N764 Abscess of vulva: Secondary | ICD-10-CM | POA: Diagnosis not present

## 2023-12-03 LAB — CBC
HCT: 35.2 % — ABNORMAL LOW (ref 36.0–46.0)
Hemoglobin: 11.1 g/dL — ABNORMAL LOW (ref 12.0–15.0)
MCH: 25.4 pg — ABNORMAL LOW (ref 26.0–34.0)
MCHC: 31.5 g/dL (ref 30.0–36.0)
MCV: 80.5 fL (ref 80.0–100.0)
Platelets: 361 10*3/uL (ref 150–400)
RBC: 4.37 MIL/uL (ref 3.87–5.11)
RDW: 16.3 % — ABNORMAL HIGH (ref 11.5–15.5)
WBC: 8 10*3/uL (ref 4.0–10.5)
nRBC: 0 % (ref 0.0–0.2)

## 2023-12-03 LAB — COMPREHENSIVE METABOLIC PANEL WITH GFR
ALT: 38 U/L (ref 0–44)
AST: 32 U/L (ref 15–41)
Albumin: 2.6 g/dL — ABNORMAL LOW (ref 3.5–5.0)
Alkaline Phosphatase: 69 U/L (ref 38–126)
Anion gap: 12 (ref 5–15)
BUN: 10 mg/dL (ref 8–23)
CO2: 24 mmol/L (ref 22–32)
Calcium: 9.7 mg/dL (ref 8.9–10.3)
Chloride: 104 mmol/L (ref 98–111)
Creatinine, Ser: 0.67 mg/dL (ref 0.44–1.00)
GFR, Estimated: >60 mL/min (ref >60)
Glucose, Bld: 107 mg/dL — ABNORMAL HIGH (ref 70–99)
Potassium: 4.1 mmol/L (ref 3.5–5.1)
Sodium: 140 mmol/L (ref 135–145)
Total Bilirubin: 0.5 mg/dL (ref 0.0–1.2)
Total Protein: 8.8 g/dL — ABNORMAL HIGH (ref 6.5–8.1)

## 2023-12-03 MED ORDER — LOSARTAN POTASSIUM 25 MG PO TABS: 12.5000 mg | ORAL_TABLET | Freq: Every day | ORAL | Status: DC

## 2023-12-03 MED ORDER — FUROSEMIDE 40 MG PO TABS: 40.0000 mg | ORAL_TABLET | Freq: Every day | ORAL | Status: AC

## 2023-12-03 NOTE — TOC Transition Note (Signed)
 Transition of Care [facility]) - Discharge Note   Patient Details  Name: [name] MRN: [id number] Date of Birth: 06-04-1951  Transition of [facility]) CM/SW Contact:  Arron Big, LCSWA Phone Number: 12/03/2023, 1:08 PM   Clinical Narrative:   Patient will DC to: [facility] Anticipated DC date: 12/03/23  Family notified: Felipa Horsfall Ewings (sis) Transport by: Lyna Sandhoff   Per MD patient ready for DC to [facility]. RN to call report prior to discharge [phone]). RN, patient, patient's family, and facility notified of DC. Discharge Summary and FL2 sent to facility. DC packet on chart. Ambulance transport requested for patient at 1:12 PM.   CSW will sign off for now as social work intervention is no longer needed. Please consult us  again if new needs arise.      Final next level of care: Skilled Nursing Facility Barriers to Discharge: Barriers Resolved   Patient Goals and CMS Choice Patient states their goals for this hospitalization and ongoing recovery are:: LTC SNF CMS Medicare.gov Compare Post Acute Care list provided to:: Patient Choice offered to / list presented to : Patient      Discharge Placement              Patient chooses bed at: [facility] Patient to be transferred to facility by: [facility] Name of family member notified: Reyes Caul; sis; [phone] Patient and family notified of of transfer: 12/03/23  Discharge Plan and Services Additional resources added to the After Visit Summary for   In-house Referral: Clinical Social Work   Post Acute Care Choice: Skilled Nursing Facility                               Social Drivers of Health (SDOH) Interventions SDOH Screenings   Food Insecurity: No Food Insecurity (11/26/2023)  Housing: Low Risk  (11/26/2023)  Transportation Needs: No Transportation Needs (11/26/2023)  Utilities: Not At Risk (11/26/2023)  Depression (PHQ2-9): Low Risk  (10/29/2023)  Social Connections: Moderately Isolated  (11/26/2023)  Stress: No Stress Concern Present (07/23/2023)   Received from [facility]  Tobacco Use: Medium Risk (11/26/2023)     Readmission Risk Interventions     No data to display

## 2023-12-03 NOTE — Plan of Care (Signed)
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
## 2023-12-03 NOTE — Progress Notes (Signed)
 Report called to Marlyse Single, LPN at [facility]

## 2023-12-03 NOTE — Discharge Summary (Signed)
 Physician Discharge Summary  [name] ZOX:[id number] DOB: [birth year] DOA: 11/26/2023  PCP: System, Provider Not In  Admit date: 11/26/2023 Discharge date: 12/03/2023  Time spent: 45 minutes  Recommendations for Outpatient Follow-up:  Wound care, twice daily wet-to-dry dressing change or as needed if soiled with stool Frequent turning and limit pressure to wound General Surgery follow-up on 5/27 Routine suprapubic catheter care Titrate diuretics at follow-up   Discharge Diagnoses:  Principal Problem:   Labial abscess Paraplegia   Essential hypertension   Constipation   Iron  deficiency anemia Chronic combined CHF   Neurogenic bowel   AKI (acute kidney injury) (HCC)   Suprapubic catheter (HCC)   Dyslipidemia   Discharge Condition: Improved  Diet recommendation: Avoid ultra processed food  Filed Weights   11/26/23 0202  Weight: 74.8 kg    History of present illness:  73 y.o. 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   She presented with worsening labial wound/abscess that is well documented and chronic - unfortunately has been more painful. Recently partially excised 11/23/23 and placed on clindamycin  with worsening systemic features of weakness/malaise, nausea, dizziness and poor appetite.    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 -Broad-spectrum antibiotics changed to oral doxycycline  for 7-day course -General Surgery follow-up on 5/27   Acute Kidney Injury, resolved  delirium, resolved   Chronic neurogenic Bowel, Bladder Chronic constipation - Now having BMs, continue bowel regimen - Continue Suprapubic catheter, routine catheter care   Hypertension - Continue home Coreg  and Losartan     CHF, diastolic - Remain euvolemic, -Most recent echo September 2022 with EF 45-50% with global hypokinesis grade 1 diastolic dysfunction - Yesterday she had an episode of transient hypotension, losartan  and Lasix  dose decreased. - Please monitor and readjust at follow-up   Hyperlipidemia CAD - Continue aspirin , statin   Rheumatoid Arthritis - Continue home Plaquenil    Chronic Normocytic/iron  deficient anemia -Iron  panel consistently low, discussed supplementation of iron  in the diet as well as over-the-counter iron  tablets   Chronic sacral Decubitis Ulcer, POA - WOCN consulted - Pressure offloading, q2H turn   Constipation, acute on chronic - Increased regimen including MiraLAX  and Senokot,     Consultants:  General surgery   Procedures:  Sharp debridement 11/26/23 of labial abscess and phlegmon    Discharge Exam: Vitals:   12/03/23 0838 12/03/23 1100  BP: (!) 135/58 (!) 121/57  Pulse: (!) 58 68  Resp: 16   Temp: 98 F (36.7 C)   SpO2: 94%     Discharge Instructions   Discharge Instructions     Ambulatory referral to Allergy   Complete by: As directed       Allergies as of 12/03/2023   No Known Allergies      Medication List     STOP taking these medications    Carboxymethylcellulose Sod PF 1 % Gel   diphenhydramine -calamine 1-8 %  Lotn Commonly known as: CALOHIST   erythromycin ophthalmic ointment   hydroquinone 2 % cream   traMADol  50 MG tablet Commonly known  as: ULTRAM        TAKE these medications    acetaminophen  325 MG tablet Commonly known as: TYLENOL  Take 650 mg by mouth every 6 (six) hours as needed.   albuterol  108 (90 Base) MCG/ACT inhaler Commonly known as: VENTOLIN  HFA Inhale 2 puffs into the lungs every 4 (four) hours as needed for wheezing or shortness of breath.   alum & mag hydroxide-simeth 400-400-40 MG/5ML suspension Commonly known as: MAALOX PLUS Take 10 mLs by mouth every 6 (six) hours as needed for indigestion, heartburn or flatulence (nausea).   aspirin  EC 81 MG tablet Take 81 mg by mouth daily. Swallow whole.   atorvastatin  40 MG tablet Commonly known as: Lipitor Take 1 tablet (40 mg total) by mouth daily. What changed: when to take this   baclofen  10 MG tablet Commonly known as: LIORESAL  Take 1 tablet (10 mg total) by mouth 3 (three) times daily.   Benzocaine 10 MG Lozg Use as directed 1 lozenge in the mouth or throat every 2 (two) hours as needed (sore throat).   bisacodyl  10 MG suppository Commonly known as: DULCOLAX Place 1 suppository (10 mg total) rectally every Tuesday, Thursday, Saturday, and Sunday at 6 PM.   calcium -vitamin D  500-200 MG-UNIT tablet Commonly known as: OSCAL WITH D Take 1 tablet by mouth 2 (two) times daily.   carvedilol  12.5 MG tablet Commonly known as: COREG  Take 1 tablet (12.5 mg total) by mouth 2 (two) times daily with a meal. Hold for SBP <100 What changed: additional instructions   cetirizine 10 MG tablet Commonly known as: ZYRTEC Take 10 mg by mouth daily as needed for allergies.   cholecalciferol  1000 units tablet Commonly known as: VITAMIN D  Take 1,000 Units by mouth daily.   Cranberry 450 MG Tabs Take 450 mg by mouth daily.   diclofenac  Sodium 1 % Gel Commonly known as: VOLTAREN  Apply 2 g topically every 6 (six) hours as needed (pain). Apply to bilateral shoulders   diphenhydrAMINE  25 mg capsule Commonly known as: BENADRYL  Take 50 mg by mouth every 6  (six) hours as needed for itching.   doxycycline  100 MG tablet Commonly known as: VIBRA -TABS Take 1 tablet (100 mg total) by mouth every 12 (twelve) hours for 6 days.   ferrous gluconate  324 MG tablet Commonly known as: FERGON Take 324 mg by mouth daily with breakfast.   furosemide  40 MG tablet Commonly known as: LASIX  Take 1 tablet (40 mg total) by mouth daily. Start taking on: Dec 04, 2023 What changed: when to take this   guaifenesin  100 MG/5ML syrup Commonly known as: ROBITUSSIN Take 200 mg by mouth every 4 (four) hours as needed for cough.   hydrocortisone  2.5 % lotion Apply 1 application. topically every 12 (twelve) hours as needed (itching of arms and legs).   hydroxychloroquine  200 MG tablet Commonly known as: PLAQUENIL  Take 200 mg by mouth daily.   hydrOXYzine  25 MG tablet Commonly known as: ATARAX  Take 50 mg by mouth every 6 (six) hours as needed for itching.   ipratropium-albuterol  0.5-2.5 (3) MG/3ML Soln Commonly known as: DUONEB Inhale 3 mLs into the lungs every 6 (six) hours as needed.   lactulose  10 GM/15ML solution Commonly known as: CHRONULAC  Take 15 mLs (10 g total) by mouth 2 (two) times daily. What changed:  when to take this reasons to take this  leptospermum manuka honey Pste paste Apply 1 Application topically daily.   lidocaine  5 % ointment Commonly known as: XYLOCAINE  Apply 1 Application topically as needed.   Linzess  290 MCG Caps capsule Generic drug: linaclotide  Take 290 mcg by mouth at bedtime.   losartan  25 MG tablet Commonly known as: COZAAR  Take 0.5 tablets (12.5 mg total) by mouth daily. What changed: how much to take   Lumify  0.025 % Soln Generic drug: Brimonidine  Tartrate Place 1 drop into both eyes daily as needed (redness).   melatonin 3 MG Tabs tablet Take 3 mg by mouth at bedtime as needed.   omeprazole  20 MG capsule Commonly known as: PRILOSEC Take 20 mg by mouth 2 (two) times daily.   ondansetron  4 MG  tablet Commonly known as: ZOFRAN  Take 4 mg by mouth every 8 (eight) hours as needed for nausea or vomiting.   oxybutynin  10 MG 24 hr tablet Commonly known as: DITROPAN -XL Take 10 mg by mouth daily.   oxyCODONE  5 MG immediate release tablet Commonly known as: Oxy IR/ROXICODONE  Take 1 tablet (5 mg total) by mouth every 4 (four) hours as needed for moderate pain (pain score 4-6).   oxymetazoline 0.05 % nasal spray Commonly known as: AFRIN Place 2 sprays into both nostrils 2 (two) times daily as needed (use at the start of nosebleed).   polyethylene glycol 17 g packet Commonly known as: MIRALAX  / GLYCOLAX  Take 17 g by mouth daily. Increase to twice a day as needed What changed:  when to take this additional instructions   prednisoLONE  acetate 1 % ophthalmic suspension Commonly known as: PRED FORTE  Place 1 drop into the left eye in the morning, at noon, and at bedtime.   Prenatal 19 29-1 MG Tabs Take 1 tablet by mouth daily at 6 (six) AM.   Simethicone  125 MG Tabs Take 125 mg by mouth every 8 (eight) hours as needed (gas pain).   sodium chloride  0.65 % Soln nasal spray Commonly known as: OCEAN Place 1 spray into both nostrils every 2 (two) hours as needed for congestion.   sucralfate  1 g tablet Commonly known as: CARAFATE  Take 1 g by mouth every 6 (six) hours as needed (GERD).   SYSTANE COMPLETE OP Place 2 drops into both eyes every 6 (six) hours as needed (Dry eyes).   tiZANidine  4 MG tablet Commonly known as: ZANAFLEX  Take 6 mg by mouth 3 (three) times daily.   triamcinolone cream 0.1 % Commonly known as: KENALOG Apply 1 application  topically as needed.   Tussin DM 10-100 MG/5ML liquid Generic drug: dextromethorphan -guaiFENesin  Take 15 mLs by mouth every 4 (four) hours as needed for cough.   zinc  sulfate (50mg  elemental zinc ) 220 (50 Zn) MG capsule Take 220 mg by mouth daily.       No Known Allergies  Follow-up Information     Maczis, Puja Gosai, PA-C  Follow up on 12/21/2023.   Specialty: General Surgery Why: 1:30pm, Arrive 30 minutes prior to your appointment time, Please bring your insurance card and photo ID Contact information: [address] [facility] [place] [address] [phone]                  The results of significant diagnostics from this hospitalization (including imaging, microbiology, ancillary and laboratory) are listed below for reference.    Significant Diagnostic Studies: CT ABDOMEN PELVIS W CONTRAST Result Date: 11/26/2023 CLINICAL DATA:  Sepsis. Evaluate for labial abscess, sacral osteomyelitis. EXAM: CT ABDOMEN AND  PELVIS WITH CONTRAST TECHNIQUE: Multidetector CT imaging of the abdomen and pelvis was performed using the standard protocol following bolus administration of intravenous contrast. RADIATION DOSE REDUCTION: This exam was performed according to the departmental dose-optimization program which includes automated exposure control, adjustment of the mA and/or kV according to patient size and/or use of iterative reconstruction technique. CONTRAST:  75mL OMNIPAQUE  IOHEXOL  350 MG/ML SOLN COMPARISON:  Numerous prior CTs back to 2009. The 2 most recent are both with for contrast dated 05/10/2023 and 08/19/2023. FINDINGS: Lower chest: Posterior atelectasis again noted on right-greater-than-left. Chronic elevated right diaphragm. No new abnormality in the lung bases. Moderate-sized hiatal hernia again noted. The cardiac size is normal. Calcification in the left main coronary artery. No pericardial fluid. Hepatobiliary: Stable 3.9 x 2.1 cm mixed density lesion in posterior aspect of the liver in segment 6 is again noted, unchanged consistent with previously reported ablation defect. Is 20 cm length and slightly steatotic without other focal abnormality. Gallbladder is absent without biliary dilatation. Pancreas: No abnormality. Spleen: 8.5 cm complex densely rim calcified cystic lesion  inferior aspect of the spleen is unchanged, likely due to remote trauma or infection. The remainder of the spleen is unremarkable. Adrenals/Urinary Tract: Stable mild nodular thickening the adrenal glands. Multiple unchanged small subcentimeter Bosniak 2 renal cortical cysts which are too small to characterize. No follow-up imaging is recommended. There is no urinary stone or obstruction. The bladder catheterized, contracted and not well seen, with suprapubic catheter in place. Stomach/Bowel: No bowel obstruction or inflammatory changes. Hiatal hernia described above. An appendix is not seen. There is moderate retained stool ascending and transverse colon. Small anorectal prolapse. Vascular/Lymphatic: Moderate aortoiliac calcific plaque. No AAA. Increased right inguinal chain adenopathy up to 1.6 cm short axis. Increasingly prominent right external iliac chain nodes to 1.9 cm, increased left external iliac chain nodes to 1.2 cm. No abdominal adenopathy is seen. Reproductive: Fibroid uterus with calcified fibroids, largest is 8.8 cm. No adnexal mass. Other: No free fluid or incarcerated hernia. Musculoskeletal: Vaginal labial swelling is noted especially on the right where there is a complex hypodense collection measuring 5.2 x 2.9 cm on 3:100, 31 Hounsfield units, consistent with abscess or phlegmon. Decubitus changes are again noted about the right ischial tuberosity with deformity. There is subcutaneous patchy soft tissue gas underlying this and extending over towards the right side of the perineum consistent with a gas-forming infection. Underlying osteomyelitis is difficult to exclude as well as an early presentation of Fournier's gangrene. No underlying destructive change to the bone is seen and no loculated collection except in the right labia. Again noted are chronic fracture nonunions in both femoral necks, with moderate granulation tissue. Advanced degenerative change lumbar spine with interbody ankylosis  multiple levels. IMPRESSION: 1. 5.2 x 2.9 cm complex hypodense collection in the right labia consistent with abscess or phlegmon. 2. Decubitus changes about the right ischial tuberosity with deformity. There is subcutaneous patchy soft tissue gas underlying this and extending over towards the right side of the perineum consistent with a gas-forming infection. Underlying osteomyelitis is difficult to exclude as well as an early presentation of Fournier's gangrene. 3. Increased right inguinal and right greater left external iliac chain adenopathy is likely reactive. 4. Constipation. 5. Hiatal hernia. 6. Aortic and coronary artery atherosclerosis. 7. Stable 3.9 x 2.1 cm mixed density lesion in the posterior aspect of the liver in segment 6 consistent with previously reported ablation defect. 8. Fibroid uterus. 9. 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  Aortic Atherosclerosis (ICD10-I70.0). Electronically Signed   By: Denman Fischer M.D.   On: 11/26/2023 06:35    Microbiology: Recent Results (from the past 240 hours)  Blood Culture (routine x 2)     Status: None   Collection Time: 11/26/23  2:12 AM   Specimen: BLOOD  Result Value Ref Range Status   Specimen Description BLOOD SITE NOT SPECIFIED  Final   Special Requests   Final    BOTTLES DRAWN AEROBIC AND ANAEROBIC Blood Culture results may not be optimal due to an inadequate volume of blood received in culture bottles   Culture   Final    NO GROWTH 5 DAYS Performed at [facility], [address] [place], [place] [address]    Report Status 12/01/2023 FINAL  Final  Blood Culture (routine x 2)     Status: None   Collection Time: 11/26/23  3:20 AM   Specimen: BLOOD RIGHT ARM  Result Value Ref Range Status   Specimen Description BLOOD RIGHT ARM  Final   Special Requests   Final    BOTTLES DRAWN AEROBIC AND ANAEROBIC Blood Culture results may not be optimal due to an inadequate volume of blood received in culture bottles   Culture    Final    NO GROWTH 5 DAYS Performed at [facility], [address], [place], [place] [address]    Report Status 12/01/2023 FINAL  Final  Surgical PCR screen     Status: Abnormal   Collection Time: 11/26/23 12:23 PM   Specimen: Nasal Mucosa; Nasal Swab  Result Value Ref Range Status   MRSA, PCR NEGATIVE NEGATIVE Final   Staphylococcus aureus POSITIVE (A) NEGATIVE Final    Comment: (NOTE) The Xpert SA Assay (FDA approved for NASAL specimens in patients 72 years of age and older), is one component of a comprehensive surveillance program. It is not intended to diagnose infection nor to guide or monitor treatment. Performed at [facility], [address], [place], [place] [address]   Aerobic Culture w Gram Stain (superficial specimen)     Status: Abnormal   Collection Time: 11/26/23  6:48 PM   Specimen: Wound  Result Value Ref Range Status   Specimen Description WOUND  Final   Special Requests NONE  Final   Gram Stain   Final    NO WBC SEEN FEW GRAM POSITIVE COCCI IN PAIRS RARE GRAM NEGATIVE RODS RARE GRAM POSITIVE RODS Performed at [facility], [address] [place], [place] [address]    Culture MULTIPLE ORGANISMS PRESENT, NONE PREDOMINANT (A)  Final   Report Status 11/29/2023 FINAL  Final  Aerobic/Anaerobic Culture w Gram Stain (surgical/deep wound)     Status: None   Collection Time: 11/26/23  6:50 PM   Specimen: Bone; Tissue  Result Value Ref Range Status   Specimen Description BONE  Final   Special Requests B  Final   Gram Stain   Final    NO WBC SEEN FEW GRAM POSITIVE COCCI IN PAIRS RARE GRAM NEGATIVE RODS RARE GRAM POSITIVE RODS    Culture   Final    RARE STAPHYLOCOCCUS AUREUS SUSCEPTIBILITIES PERFORMED ON PREVIOUS CULTURE WITHIN THE LAST 5 DAYS. MODERATE BACTEROIDES FRAGILIS BETA LACTAMASE POSITIVE Performed at [facility], [address] [place], [place] [address]    Report Status 11/30/2023 FINAL  Final  Aerobic/Anaerobic  Culture w Gram Stain (surgical/deep wound)     Status: None   Collection Time: 11/26/23  6:52 PM   Specimen: Soft Tissue, Other  Result Value Ref Range Status   Specimen Description TISSUE  Final   Special Requests C  Final   Gram Stain   Final    NO WBC SEEN FEW GRAM POSITIVE COCCI IN PAIRS RARE GRAM NEGATIVE RODS RARE GRAM POSITIVE RODS    Culture   Final    RARE METHICILLIN RESISTANT STAPHYLOCOCCUS AUREUS WITHIN MIXED AEROBIC ORGANISMS MODERATE BACTEROIDES SPECIES NOT FRAGILIS BETA LACTAMASE POSITIVE Performed at [facility], [address] [place], [place] [address]    Report Status 11/30/2023 FINAL  Final   Organism ID, Bacteria METHICILLIN RESISTANT STAPHYLOCOCCUS AUREUS  Final      Susceptibility   Methicillin resistant staphylococcus aureus - MIC*    CIPROFLOXACIN >=8 RESISTANT Resistant     ERYTHROMYCIN >=8 RESISTANT Resistant     GENTAMICIN <=0.5 SENSITIVE Sensitive     OXACILLIN >=4 RESISTANT Resistant     TETRACYCLINE <=1 SENSITIVE Sensitive     VANCOMYCIN  <=0.5 SENSITIVE Sensitive     TRIMETH /SULFA  <=10 SENSITIVE Sensitive     CLINDAMYCIN  >=8 RESISTANT Resistant     RIFAMPIN <=0.5 SENSITIVE Sensitive     Inducible Clindamycin  NEGATIVE Sensitive     LINEZOLID  4 SENSITIVE Sensitive     * RARE METHICILLIN RESISTANT STAPHYLOCOCCUS AUREUS     Labs: Basic Metabolic Panel: Recent Labs  Lab 11/27/23 0551 11/28/23 0513 12/01/23 0440 12/03/23 0935  NA 130* 136 139 140  K 3.5 3.3* 3.7 4.1  CL 101 104 106 104  CO2 20* 21* 22 24  GLUCOSE 112* 118* 86 107*  BUN 16 13 9 10   CREATININE 0.97 0.86 0.67 0.67  CALCIUM  8.6* 8.9 9.5 9.7   Liver Function Tests: Recent Labs  Lab 11/27/23 0551 11/28/23 0513 12/01/23 0440 12/03/23 0935  AST 41 35 20 32  ALT 43 44 30 38  ALKPHOS 70 66 63 69  BILITOT 1.3* 0.9 0.7 0.5  PROT 6.1* 7.2 7.7 8.8*  ALBUMIN  2.1* 2.3* 2.3* 2.6*   No results for input(s): "LIPASE", "AMYLASE" in the last 168 hours. No results for  input(s): "AMMONIA" in the last 168 hours. CBC: Recent Labs  Lab 11/27/23 0551 11/28/23 0513 12/01/23 0440 12/03/23 0935  WBC 14.6* 10.2 7.6 8.0  HGB 8.8* 9.5* 9.6* 11.1*  HCT 27.6* 29.9* 30.5* 35.2*  MCV 78.9* 79.9* 79.6* 80.5  PLT 229 259 322 361   Cardiac Enzymes: No results for input(s): "CKTOTAL", "CKMB", "CKMBINDEX", "TROPONINI" in the last 168 hours. BNP: BNP (last 3 results) No results for input(s): "BNP" in the last 8760 hours.  ProBNP (last 3 results) No results for input(s): "PROBNP" in the last 8760 hours.  CBG: Recent Labs  Lab 11/28/23 2309  GLUCAP 147*       Signed:  Deforest Fast MD.  [facility] 12/03/2023, 11:47 AM

## 2023-12-08 ENCOUNTER — Encounter: Payer: 59 | Admitting: Physical Medicine and Rehabilitation

## 2023-12-15 ENCOUNTER — Telehealth (HOSPITAL_COMMUNITY): Payer: Self-pay

## 2023-12-15 NOTE — Telephone Encounter (Signed)
 Returned call regarding patient, original caller did not provide their name. Message left was asking about a specific type of ultrasound, and no additional informaiton was able to be provided at time of return call. Due to limited information I was unable to assist the patient. Patient stated "if there is nothing scheduled im not worried about it", I confirmed with the patient that this meant they wanted me to leave this situation alone and look no further into it. Patient expressed understanding and got off the phone.

## 2023-12-23 NOTE — Progress Notes (Unsigned)
 Cardiology Office Note:   Date:  12/24/2023  ID:  [name], DOB November 11, 1950, MRN [id number] PCP: System, Provider Not In  [facility] Providers Cardiologist:  Eilleen Grates, MD {  History of Present Illness:   [name] is a 73 y.o. female who presents for follow up of congestive heart failure.  She was in the hospital in Dec 2018.   In 2016 she had a normal EF.  However, during the recent hospitalization she had an EF of 20%. Cath demonstrated normal coronaries.  She had thoracentesis of 1.2 liters on the left.  She went back to the nursing home where she lives but required another thoracentesis.   In Sept 2022 the EF was up to 45 - 50%.  She has had many medical issues including amputation of toes, treatment for hepatocellular carcinoma, suprapubic catheter for neurogenic bladder, AKI/UTI.     She presents for follow up.  She was in the hospital recently with labial abscess.   I did look through these records.  There was no heart failure noted and I do not see any evidence of volume overload or cardiac issues.   She is having the abscess treated now at her new residential facility.  She comes today for routine follow-up but she has been complaining of a cough.  This has been nonproductive.  Happens more when she lies flat.  She is not having any new shortness of breath or PND.  She is not having any new palpitations, presyncope or syncope.  She is having some itching and breaking out and is going to see an allergist soon.  She denies any chest pressure, neck or arm discomfort.   ROS: As stated in the HPI and negative for all other systems.  Studies Reviewed:    EKG:   EKG Interpretation Date/Time:  Friday Dec 24 2023 13:00:11 EDT Ventricular Rate:  73 PR Interval:  170 QRS Duration:  80 QT Interval:  396 QTC Calculation: 436 R Axis:   -3  Text Interpretation: Normal sinus rhythm Minimal voltage criteria for LVH, may be normal variant ( R in aVL ) Nonspecific T  wave abnormality When compared with ECG of 26-Nov-2023 03:44, No significant change was found Confirmed by Eilleen Grates ([id number]) on 12/24/2023 1:03:36 PM     Risk Assessment/Calculations:              Physical Exam:   VS:  BP 132/76 (BP Location: Right Arm, Patient Position: Sitting, Cuff Size: Normal)   Pulse 77   Ht 5\' 6"  (1.676 m)   SpO2 94%   BMI 26.63 kg/m    Wt Readings from Last 3 Encounters:  11/26/23 165 lb (74.8 kg)  11/09/23 162 lb (73.5 kg)  08/19/23 160 lb (72.6 kg)     GEN: Well nourished, well developed in no acute distress NECK: No JVD; No carotid bruits CARDIAC: RRR, no murmurs, rubs, gallops RESPIRATORY:   Mild decreased breath sounds at the bases.  ABDOMEN: Soft, non-tender, non-distended EXTREMITIES:  No edema; No deformity   ASSESSMENT AND PLAN:   NICM (nonischemic cardiomyopathy) (HCC) EF was 45%.    I am going to repeat an echocardiogram.  For now she will continue on the meds as listed.  She has a low blood pressure which really does not allow for medical titration.   Cough: I do not think this is volume but I would like to check a BNP level and an echocardiogram as well as chest x-ray.  She is also going to talk to her facility physician.  She is not having sinus drainage.  She is already on Prilosec but I wonder if this could be reflux.  I am going to be ruling out the heart failure.  She will talk further with her primary provider.    Follow up with me in 1 year if the above studies are unchanged or unremarkable.  Signed, Eilleen Grates, MD

## 2023-12-24 ENCOUNTER — Encounter: Payer: Self-pay | Admitting: Cardiology

## 2023-12-24 ENCOUNTER — Ambulatory Visit: Attending: Cardiology | Admitting: Cardiology

## 2023-12-24 VITALS — BP 132/76 | HR 77 | Ht 66.0 in

## 2023-12-24 DIAGNOSIS — E785 Hyperlipidemia, unspecified: Secondary | ICD-10-CM | POA: Diagnosis not present

## 2023-12-24 DIAGNOSIS — I5042 Chronic combined systolic (congestive) and diastolic (congestive) heart failure: Secondary | ICD-10-CM

## 2023-12-24 DIAGNOSIS — I428 Other cardiomyopathies: Secondary | ICD-10-CM | POA: Diagnosis not present

## 2023-12-24 NOTE — Patient Instructions (Signed)
 Medication Instructions:  Your physician recommends that you continue on your current medications as directed. Please refer to the Current Medication list given to you today.  *If you need a refill on your cardiac medications before your next appointment, please call your pharmacy*  Lab Work: BNP today If you have labs (blood work) drawn today and your tests are completely normal, you will receive your results only by: MyChart Message (if you have MyChart) OR A paper copy in the mail If you have any lab test that is abnormal or we need to change your treatment, we will call you to review the results.  Testing/Procedures: Chest Xray  Echocardiogram Your physician has requested that you have an echocardiogram. Echocardiography is a painless test that uses sound waves to create images of your heart. It provides your doctor with information about the size and shape of your heart and how well your heart's chambers and valves are working. This procedure takes approximately one hour. There are no restrictions for this procedure. Please do NOT wear cologne, perfume, aftershave, or lotions (deodorant is allowed). Please arrive 15 minutes prior to your appointment time.  Please note: We ask at that you not bring children with you during ultrasound (echo/ vascular) testing. Due to room size and safety concerns, children are not allowed in the ultrasound rooms during exams. Our front office staff cannot provide observation of children in our lobby area while testing is being conducted. An adult accompanying a patient to their appointment will only be allowed in the ultrasound room at the discretion of the ultrasound technician under special circumstances. We apologize for any inconvenience.   Follow-Up: At [facility], you and your health needs are our priority.  As part of our continuing mission to provide you with exceptional heart care, our providers are all part of one team.  This team  includes your primary Cardiologist (physician) and Advanced Practice Providers or APPs (Physician Assistants and Nurse Practitioners) who all work together to provide you with the care you need, when you need it.  Your next appointment:   1 year  Provider:   Lavonne Prairie, MD  We recommend signing up for the patient portal called "MyChart".  Sign up information is provided on this After Visit Summary.  MyChart is used to connect with patients for Virtual Visits (Telemedicine).  Patients are able to view lab/test results, encounter notes, upcoming appointments, etc.  Non-urgent messages can be sent to your provider as well.   To learn more about what you can do with MyChart, go to [email or web address].   Other Instructions

## 2023-12-25 LAB — PRO B NATRIURETIC PEPTIDE: NT-Pro BNP: 237 pg/mL (ref 0–301)

## 2023-12-29 ENCOUNTER — Encounter: Payer: Self-pay | Admitting: Allergy

## 2023-12-29 ENCOUNTER — Ambulatory Visit (INDEPENDENT_AMBULATORY_CARE_PROVIDER_SITE_OTHER): Admitting: Allergy

## 2023-12-29 ENCOUNTER — Ambulatory Visit: Payer: Self-pay | Admitting: *Deleted

## 2023-12-29 ENCOUNTER — Other Ambulatory Visit: Payer: Self-pay

## 2023-12-29 VITALS — BP 116/72 | HR 90 | Temp 98.1°F | Ht 65.0 in | Wt 155.0 lb

## 2023-12-29 DIAGNOSIS — R21 Rash and other nonspecific skin eruption: Secondary | ICD-10-CM | POA: Diagnosis not present

## 2023-12-29 DIAGNOSIS — J3089 Other allergic rhinitis: Secondary | ICD-10-CM | POA: Diagnosis not present

## 2023-12-29 DIAGNOSIS — T783XXD Angioneurotic edema, subsequent encounter: Secondary | ICD-10-CM | POA: Diagnosis not present

## 2023-12-29 DIAGNOSIS — L299 Pruritus, unspecified: Secondary | ICD-10-CM

## 2023-12-29 DIAGNOSIS — T783XXA Angioneurotic edema, initial encounter: Secondary | ICD-10-CM

## 2023-12-29 NOTE — Patient Instructions (Addendum)
 Skin Etiology unclear.  Keep track of rashes and take pictures. Write down what you had done during flares.  See below for proper skin care. Use fragrance free and dye free products. No dryer sheets or fabric softener.   Use over the counter antihistamines such as Zyrtec (cetirizine), Claritin  (loratadine ), Allegra (fexofenadine), or Xyzal (levocetirizine) daily as needed for itching. May switch antihistamines every few months. Get bloodwork. We are ordering labs, so please allow 1-2 weeks for the results to come back. With the newly implemented Cures Act, the labs might be visible to you at the same time that they become visible to me. However, I will not address the results until all of the results are back, so please be patient.  In the meantime, continue recommendations in your patient instructions, including avoidance measures (if applicable), until you hear from me.  Swelling episodes Keep track of episodes and take pictures. Get bloodwork.  Environmental allergies Monitor symptoms. Get bloodwork.   Follow up depending on bloodwork results.   Keep July dermatology appointment.   Skin care recommendations  Bath time: Always use lukewarm water. AVOID very hot or cold water. Keep bathing time to 5-10 minutes. Do NOT use bubble bath. Use a mild soap and use just enough to wash the dirty areas. Do NOT scrub skin vigorously.  After bathing, pat dry your skin with a towel. Do NOT rub or scrub the skin.  Moisturizers and prescriptions:  ALWAYS apply moisturizers immediately after bathing (within 3 minutes). This helps to lock-in moisture. Use the moisturizer several times a day over the whole body. Good summer moisturizers include: Aveeno, CeraVe, Cetaphil. Good winter moisturizers include: Aquaphor, Vaseline, Cerave, Cetaphil, Eucerin, Vanicream. When using moisturizers along with medications, the moisturizer should be applied about one hour after applying the medication to  prevent diluting effect of the medication or moisturize around where you applied the medications. When not using medications, the moisturizer can be continued twice daily as maintenance.  Laundry and clothing: Avoid laundry products with added color or perfumes. Use unscented hypo-allergenic laundry products such as Tide free, Cheer free & gentle, and All free and clear.  If the skin still seems dry or sensitive, you can try double-rinsing the clothes. Avoid tight or scratchy clothing such as wool. Do not use fabric softeners or dyer sheets.
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
## 2024-01-10 LAB — COMPREHENSIVE METABOLIC PANEL WITH GFR
ALT: 26 IU/L (ref 0–32)
AST: 18 IU/L (ref 0–40)
Albumin: 4.2 g/dL (ref 3.8–4.8)
Alkaline Phosphatase: 126 IU/L — ABNORMAL HIGH (ref 44–121)
BUN/Creatinine Ratio: 23 (ref 12–28)
BUN: 14 mg/dL (ref 8–27)
Bilirubin Total: 0.3 mg/dL (ref 0.0–1.2)
CO2: 19 mmol/L — ABNORMAL LOW (ref 20–29)
Calcium: 10.7 mg/dL — ABNORMAL HIGH (ref 8.7–10.3)
Chloride: 96 mmol/L (ref 96–106)
Creatinine, Ser: 0.61 mg/dL (ref 0.57–1.00)
Globulin, Total: 4.8 g/dL — ABNORMAL HIGH (ref 1.5–4.5)
Glucose: 110 mg/dL — ABNORMAL HIGH (ref 70–99)
Potassium: 4.1 mmol/L (ref 3.5–5.2)
Sodium: 136 mmol/L (ref 134–144)
Total Protein: 9 g/dL — ABNORMAL HIGH (ref 6.0–8.5)
eGFR: 94 mL/min/{1.73_m2} (ref >59)

## 2024-01-10 LAB — FANA STAINING PATTERNS: Homogeneous Pattern: 1:80 {titer}

## 2024-01-10 LAB — C-REACTIVE PROTEIN: CRP: 17 mg/L — ABNORMAL HIGH (ref 0–10)

## 2024-01-10 LAB — ALPHA-GAL PANEL
Allergen Lamb IgE: <0.1 kU/L
Beef IgE: <0.1 kU/L
IgE (Immunoglobulin E), Serum: 179 [IU]/mL (ref 6–495)
O215-IgE Alpha-Gal: <0.1 kU/L
Pork IgE: <0.1 kU/L

## 2024-01-10 LAB — ALLERGENS W/TOTAL IGE AREA 2
Alternaria Alternata IgE: <0.1 kU/L
Aspergillus Fumigatus IgE: <0.1 kU/L
Bermuda Grass IgE: <0.1 kU/L
Cat Dander IgE: <0.1 kU/L
Cedar, Mountain IgE: <0.1 kU/L
Cladosporium Herbarum IgE: <0.1 kU/L
Cockroach, German IgE: <0.1 kU/L
Common Silver Birch IgE: <0.1 kU/L
Cottonwood IgE: <0.1 kU/L
D Farinae IgE: <0.1 kU/L
D Pteronyssinus IgE: <0.1 kU/L
Dog Dander IgE: <0.1 kU/L
Elm, American IgE: 0.12 kU/L — AB
Johnson Grass IgE: <0.1 kU/L
Maple/Box Elder IgE: <0.1 kU/L
Mouse Urine IgE: <0.1 kU/L
Oak, White IgE: <0.1 kU/L
Pecan, Hickory IgE: 0.59 kU/L — AB
Penicillium Chrysogen IgE: <0.1 kU/L
Pigweed, Rough IgE: <0.1 kU/L
Ragweed, Short IgE: <0.1 kU/L
Sheep Sorrel IgE Qn: <0.1 kU/L
Timothy Grass IgE: <0.1 kU/L
White Mulberry IgE: <0.1 kU/L

## 2024-01-10 LAB — ANTINUCLEAR ANTIBODIES, IFA: ANA Titer 1: POSITIVE — AB

## 2024-01-10 LAB — CBC WITH DIFFERENTIAL/PLATELET
Basophils Absolute: 0 10*3/uL (ref 0.0–0.2)
Basos: 0 %
EOS (ABSOLUTE): 0.2 10*3/uL (ref 0.0–0.4)
Eos: 2 %
Hematocrit: 42 % (ref 34.0–46.6)
Hemoglobin: 12.9 g/dL (ref 11.1–15.9)
Immature Grans (Abs): 0 10*3/uL (ref 0.0–0.1)
Immature Granulocytes: 0 %
Lymphocytes Absolute: 1.6 10*3/uL (ref 0.7–3.1)
Lymphs: 18 %
MCH: 25.3 pg — ABNORMAL LOW (ref 26.6–33.0)
MCHC: 30.7 g/dL — ABNORMAL LOW (ref 31.5–35.7)
MCV: 82 fL (ref 79–97)
Monocytes Absolute: 0.3 10*3/uL (ref 0.1–0.9)
Monocytes: 4 %
Neutrophils Absolute: 6.6 10*3/uL (ref 1.4–7.0)
Neutrophils: 75 %
Platelets: 386 10*3/uL (ref 150–450)
RBC: 5.1 x10E6/uL (ref 3.77–5.28)
RDW: 16.2 % — ABNORMAL HIGH (ref 11.7–15.4)
WBC: 8.7 10*3/uL (ref 3.4–10.8)

## 2024-01-10 LAB — TRYPTASE: Tryptase: 6.9 ug/L (ref 2.2–13.2)

## 2024-01-10 LAB — COMPLEMENT COMPONENT C1Q: Complement C1Q: 18.9 mg/dL (ref 10.3–20.5)

## 2024-01-10 LAB — C3 AND C4
Complement C3, Serum: 192 mg/dL — ABNORMAL HIGH (ref 82–167)
Complement C4, Serum: 39 mg/dL — ABNORMAL HIGH (ref 12–38)

## 2024-01-10 LAB — C1 ESTERASE INHIBITOR: C1INH SerPl-mCnc: 54 mg/dL — ABNORMAL HIGH (ref 21–39)

## 2024-01-10 LAB — SEDIMENTATION RATE: Sed Rate: 100 mm/h — ABNORMAL HIGH (ref 0–40)

## 2024-01-10 LAB — C1 ESTERASE INHIBITOR, FUNCTIONAL: C1INH Functional/C1INH Total MFr SerPl: >105 %{normal}

## 2024-01-10 LAB — CHRONIC URTICARIA PD-BAT: Pooled Donor- BAT CU: 7.3 % (ref 0.00–10.60)

## 2024-01-10 LAB — THYROID CASCADE PROFILE: TSH: 2.1 u[IU]/mL (ref 0.450–4.500)

## 2024-01-17 ENCOUNTER — Ambulatory Visit: Payer: 59 | Admitting: Physical Medicine and Rehabilitation

## 2024-01-18 ENCOUNTER — Ambulatory Visit: Payer: Self-pay | Admitting: Allergy

## 2024-01-26 ENCOUNTER — Encounter: Payer: 59 | Attending: Physical Medicine and Rehabilitation | Admitting: Physical Medicine and Rehabilitation

## 2024-01-26 ENCOUNTER — Encounter: Payer: Self-pay | Admitting: Physical Medicine and Rehabilitation

## 2024-01-26 VITALS — BP 120/75 | HR 82 | Ht 65.0 in | Wt 156.0 lb

## 2024-01-26 DIAGNOSIS — R252 Cramp and spasm: Secondary | ICD-10-CM | POA: Insufficient documentation

## 2024-01-26 DIAGNOSIS — N766 Ulceration of vulva: Secondary | ICD-10-CM | POA: Insufficient documentation

## 2024-01-26 DIAGNOSIS — M25512 Pain in left shoulder: Secondary | ICD-10-CM | POA: Diagnosis present

## 2024-01-26 DIAGNOSIS — Z9359 Other cystostomy status: Secondary | ICD-10-CM | POA: Diagnosis present

## 2024-01-26 DIAGNOSIS — M25511 Pain in right shoulder: Secondary | ICD-10-CM | POA: Diagnosis not present

## 2024-01-26 DIAGNOSIS — G8221 Paraplegia, complete: Secondary | ICD-10-CM | POA: Diagnosis not present
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
## 2024-02-02 ENCOUNTER — Encounter: Payer: Self-pay | Admitting: Physician Assistant

## 2024-02-02 ENCOUNTER — Encounter (INDEPENDENT_AMBULATORY_CARE_PROVIDER_SITE_OTHER): Admitting: Physician Assistant

## 2024-02-02 DIAGNOSIS — Z0389 Encounter for observation for other suspected diseases and conditions ruled out: Secondary | ICD-10-CM

## 2024-02-02 NOTE — Progress Notes (Deleted)
   New Patient Visit   Subjective  [name] is a 73 y.o. female who presents for the following: ***    The following portions of the chart were reviewed this encounter and updated as appropriate: medications, allergies, medical history  Review of Systems:  No other skin or systemic complaints except as noted in HPI or Assessment and Plan.  Objective  Well appearing patient in no apparent distress; mood and affect are within normal limits.  A focused examination was performed of the following areas: Face and arms  Relevant exam findings are noted in the Assessment and Plan.    Assessment & Plan       No follow-ups on file.  I, Darice Smock, CMA, am acting as scribe for [employer], PA-C.   Documentation: I have reviewed the above documentation for accuracy and completeness, and I agree with the above.  SANDRIDGE,BRENDA K, PA-C

## 2024-02-03 NOTE — Progress Notes (Signed)
 Error

## 2024-02-09 ENCOUNTER — Ambulatory Visit (HOSPITAL_COMMUNITY)
Admission: RE | Admit: 2024-02-09 | Discharge: 2024-02-09 | Disposition: A | Discharge status: Home / Self Care | Source: Ambulatory Visit | Attending: Cardiovascular Disease | Admitting: Cardiovascular Disease

## 2024-02-09 DIAGNOSIS — I5042 Chronic combined systolic (congestive) and diastolic (congestive) heart failure: Secondary | ICD-10-CM

## 2024-02-09 DIAGNOSIS — I428 Other cardiomyopathies: Secondary | ICD-10-CM

## 2024-02-09 LAB — ECHOCARDIOGRAM COMPLETE
Area-P 1/2: 6.22 cm2
S' Lateral: 3.28 cm

## 2024-02-24 MED ORDER — LOSARTAN POTASSIUM 25 MG PO TABS: 25.0000 mg | ORAL_TABLET | Freq: Every day | ORAL | 0 refills | Status: DC

## 2024-02-24 MED ORDER — LOSARTAN POTASSIUM 25 MG PO TABS: 25.0000 mg | ORAL_TABLET | Freq: Every day | ORAL | 0 refills | Status: AC

## 2024-02-24 NOTE — Telephone Encounter (Signed)
-----   Message from Lynwood Schilling sent at 02/10/2024 12:58 PM EDT ----- The EF was 30 to 35%.  This is better than what it was in 2018 when it was 20 to 25%.  It is somewhat lower however then the EF in 2022.  She did have a mildly elevated BNP. She is on furosemide .  I had like to see if she could tolerate slightly some med titration though this has been an issue in the past.  I would start with increasing her Cozaar  to 25 mg daily instead  of a half a tablet.  I would like to have her come back in the next month to 6 weeks to see me or an APP.  Call [name] with the results  ----- Message ----- From: Interface, [employer] Sent: 02/09/2024   4:31 PM EDT To: Lynwood Schilling, MD

## 2024-02-24 NOTE — Telephone Encounter (Signed)
 Spoke with pt regarding her prescription. Pt had me call [facility] to get a fax number to send her prescription to. Prescription will be sent via fax. Pt aware and facility aware. Pt verbalized understanding. All question if any were answered. Message sent to scheduling to find her an appointment.

## 2024-03-10 ENCOUNTER — Encounter: Attending: Physical Medicine and Rehabilitation | Admitting: Physical Medicine and Rehabilitation

## 2024-03-10 ENCOUNTER — Encounter: Payer: Self-pay | Admitting: Physical Medicine and Rehabilitation

## 2024-03-10 VITALS — BP 102/54 | HR 93 | Ht 65.0 in | Wt 146.0 lb

## 2024-03-10 DIAGNOSIS — Z993 Dependence on wheelchair: Secondary | ICD-10-CM | POA: Diagnosis present

## 2024-03-10 DIAGNOSIS — R252 Cramp and spasm: Secondary | ICD-10-CM | POA: Insufficient documentation

## 2024-03-10 DIAGNOSIS — M7918 Myalgia, other site: Secondary | ICD-10-CM | POA: Insufficient documentation

## 2024-03-10 DIAGNOSIS — G8220 Paraplegia, unspecified: Secondary | ICD-10-CM | POA: Diagnosis present

## 2024-03-10 DIAGNOSIS — L89156 Pressure-induced deep tissue damage of sacral region: Secondary | ICD-10-CM | POA: Insufficient documentation
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
## 2024-03-23 NOTE — Progress Notes (Unsigned)
  Cardiology Office Note:   Date:  03/24/2024  ID:  [name], DOB 11/04/1950, MRN [id number] PCP: System, Provider Not In   HeartCare Providers Cardiologist:  Lynwood Schilling, MD {  History of Present Illness:   [name] is a 73 y.o. female who presents for follow up of congestive heart failure.  She was in the hospital in Dec 2018.   In 2016 she had a normal EF.  However, during the recent hospitalization she had an EF of 20%. Cath demonstrated normal coronaries.  She had thoracentesis of 1.2 liters on the left.  She went back to the nursing home where she lives but required another thoracentesis.   In Sept 2022 the EF was up to 45 - 50%.  She has had many medical issues including amputation of toes, treatment for hepatocellular carcinoma, suprapubic catheter for neurogenic bladder, AKI/UTI.  I saw her in May and she was doing okay.  I sent her for follow-up EF and this was 30 to 35% which was slightly lower than 2022 but better than in 2018 when it was 20 to 25%.  I asked to come back sooner than planned for med titration.  She continues to complain of cough nonproductive.  She is not describing new shortness of breath, PND or orthopnea.  Is not having any new palpitations, presyncope or syncope.  She is being worked up for pruritus of unclear etiology and has had extensive lab draw.  She has a continued low blood pressure but she has not had any presyncope or syncope.     ROS: As stated in the HPI and negative for all other systems.  Studies Reviewed:    EKG:     NA  Risk Assessment/Calculations:              Physical Exam:   VS:  BP (!) 87/57   Pulse (!) 54   Ht 5' 5 (1.651 m)   SpO2 94%   BMI 24.30 kg/m    Wt Readings from Last 3 Encounters:  03/10/24 146 lb (66.2 kg)  01/26/24 156 lb (70.8 kg)  12/29/23 155 lb (70.3 kg)     GEN: Well nourished, well developed in no acute distress NECK: No JVD; No carotid bruits CARDIAC: RRR, no murmurs, rubs,  gallops RESPIRATORY:  Clear to auscultation without rales, wheezing or rhonchi  ABDOMEN: Soft, non-tender, non-distended EXTREMITIES:  No edema; No deformity   ASSESSMENT AND PLAN:   NICM (nonischemic cardiomyopathy) (HCC) EF was slightly lower as above.   However, her blood pressure would not allow med titration.  She is not having any overt heart failure symptoms although she has some cough.  Chest x-ray was relatively unremarkable except for some low lung volumes.   BNP in May was really only very mildly elevated.  No change in therapy except as below.    Cough The etiology of this is not clear.  I would have her stop the Cozaar  for a month and see if that helps.  She will let me know.  I like for her to see pulmonary.  If there is no pulmonary workup or suggestion of etiology after that evaluation be happy to see her back and consider other workup such as right heart cath.     Follow up with me after pulmonary evaluation.  Signed, Lynwood Schilling, MD

## 2024-03-24 ENCOUNTER — Encounter: Payer: Self-pay | Admitting: Cardiology

## 2024-03-24 ENCOUNTER — Ambulatory Visit: Attending: Cardiology | Admitting: Cardiology

## 2024-03-24 VITALS — BP 87/57 | HR 54 | Ht 65.0 in

## 2024-03-24 DIAGNOSIS — R059 Cough, unspecified: Secondary | ICD-10-CM | POA: Diagnosis not present

## 2024-03-24 DIAGNOSIS — I5042 Chronic combined systolic (congestive) and diastolic (congestive) heart failure: Secondary | ICD-10-CM | POA: Diagnosis not present

## 2024-03-24 NOTE — Patient Instructions (Addendum)
 Medication Instructions:  HOLD Cozaar  for 1 month *If you need a refill on your cardiac medications before your next appointment, please call your pharmacy*  Lab Work: NONE If you have labs (blood work) drawn today and your tests are completely normal, you will receive your results only by: MyChart Message (if you have MyChart) OR A paper copy in the mail If you have any lab test that is abnormal or we need to change your treatment, we will call you to review the results.  Testing/Procedures: NONE  Follow-Up: At [facility], you and your health needs are our priority.  As part of our continuing mission to provide you with exceptional heart care, our providers are all part of one team.  This team includes your primary Cardiologist (physician) and Advanced Practice Providers or APPs (Physician Assistants and Nurse Practitioners) who all work together to provide you with the care you need, when you need it.  Your next appointment:   6 months  Provider:   [place], MD  We recommend signing up for the patient portal called MyChart.  Sign up information is provided on this After Visit Summary.  MyChart is used to connect with patients for Virtual Visits (Telemedicine).  Patients are able to view lab/test results, encounter notes, upcoming appointments, etc.  Non-urgent messages can be sent to your provider as well.   To learn more about what you can do with MyChart, go to [email or web address].   Other Instructions You have been referred to Pulmonary. Someone will reach out to you to schedule an appointment.

## 2024-04-20 ENCOUNTER — Ambulatory Visit

## 2024-04-20 VITALS — BP 122/64 | HR 83 | Temp 98.4°F | Ht 65.0 in

## 2024-04-20 DIAGNOSIS — R768 Other specified abnormal immunological findings in serum: Secondary | ICD-10-CM

## 2024-04-20 DIAGNOSIS — R898 Other abnormal findings in specimens from other organs, systems and tissues: Secondary | ICD-10-CM

## 2024-04-20 DIAGNOSIS — Z9109 Other allergy status, other than to drugs and biological substances: Secondary | ICD-10-CM

## 2024-04-20 DIAGNOSIS — K2100 Gastro-esophageal reflux disease with esophagitis, without bleeding: Secondary | ICD-10-CM

## 2024-04-20 DIAGNOSIS — R053 Chronic cough: Secondary | ICD-10-CM

## 2024-04-20 NOTE — Patient Instructions (Addendum)
 Complete PFT prior to your next visit.  CT chest will be scheduled within a month.

## 2024-04-20 NOTE — Assessment & Plan Note (Signed)
 I explained to the patient in detail that chronic cough could be a manifestation of chronic sinusitis with post nasal drip, cough variant asthma, Nonasthmatic eosinophilic bronchitis, gastroesophageal reflux, laryngopharyngeal reflux, parenchymal lung disease like fibrosis, pulmonary edema from congestive heart failure, habitual cough or lung cancer.  Reviewed allergy clinic notes and labs.  Patient does not have much allergic sinus symptoms currently Will follow-up rheumatologic labs.  Will schedule HRCT to rule out early ILD. Although she does not have overt heart failure symptoms, in the settings of congestive heart failure, previously elevated BNP, enlarged pulmonary artery and history of pleural effusions related to heart failure, cannot rule out pulm edema  as a cause of her cough.  However patient is currently not hypoxic. Since her symptoms get worse with laying flat, GERD is a potential cause.  Patient reports prior history of GERD and positive response to Carafate .  Will refer her back to GI as she has been lost to follow-up Orders:   CT CHEST HIGH RESOLUTION; Future   Pulmonary Function Test; Future
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
## 2024-04-20 NOTE — Assessment & Plan Note (Signed)
 Advised patient to resume PPI Discussed GI clinic referral, patient agreeable Conservative measures discussed Orders:   CT CHEST HIGH RESOLUTION; Future   Pulmonary Function Test; Future

## 2024-04-27 ENCOUNTER — Ambulatory Visit (HOSPITAL_COMMUNITY)
Admission: RE | Admit: 2024-04-27 | Discharge: 2024-04-27 | Disposition: A | Discharge status: Home / Self Care | Source: Ambulatory Visit

## 2024-04-27 DIAGNOSIS — R053 Chronic cough: Secondary | ICD-10-CM | POA: Diagnosis present

## 2024-04-27 DIAGNOSIS — R898 Other abnormal findings in specimens from other organs, systems and tissues: Secondary | ICD-10-CM | POA: Insufficient documentation

## 2024-04-27 DIAGNOSIS — Z9109 Other allergy status, other than to drugs and biological substances: Secondary | ICD-10-CM | POA: Insufficient documentation

## 2024-04-27 DIAGNOSIS — R7689 Other specified abnormal immunological findings in serum: Secondary | ICD-10-CM | POA: Diagnosis present

## 2024-04-27 DIAGNOSIS — K2100 Gastro-esophageal reflux disease with esophagitis, without bleeding: Secondary | ICD-10-CM | POA: Diagnosis present

## 2024-04-28 ENCOUNTER — Encounter: Admitting: Physical Medicine and Rehabilitation

## 2024-05-01 ENCOUNTER — Ambulatory Visit: Payer: Self-pay

## 2024-05-03 ENCOUNTER — Encounter: Payer: Self-pay | Admitting: Physician Assistant

## 2024-05-03 ENCOUNTER — Ambulatory Visit: Admitting: Physician Assistant

## 2024-05-03 VITALS — BP 131/86 | HR 83

## 2024-05-03 DIAGNOSIS — L299 Pruritus, unspecified: Secondary | ICD-10-CM | POA: Diagnosis not present

## 2024-05-03 NOTE — Progress Notes (Signed)
   New Patient Visit   Subjective  [name] is a 73 y.o. female NEW PATIENT who presents for the following: Skin lesion on lip, hyperpigmentation and itching.  Patient states she has a skin lesion on her bottom lip that has been biopsied as normal and treated as a actinic keratosis but still continues to bother her. Dark pigmented patches on her face, neck and arms which are not symptomatic but cosmetically bothersome.   Other concern: itching. Now x 1.5 years. Does have a history of hepatocellular carcinoma from hepatitis C (reports to have been treated).   Has recently (this week) saw [facility] and her lip and dark patches on skin were addressed and due to the complexity of pruritus - I will concentrate only on that today. She has a follow up scheduled with [facility] in November.     The following portions of the chart were reviewed this encounter and updated as appropriate: medications, allergies, medical history  Review of Systems:  No other skin or systemic complaints except as noted in HPI or Assessment and Plan.  Objective  Well appearing patient in no apparent distress; mood and affect are within normal limits.   A focused examination was performed of the following areas: Face,arms, neck, back, chest and abdomen   Relevant exam findings are noted in the Assessment and Plan.    Assessment & Plan   PRURITUS - GENERALIZED  - lengthy conversation regarding condition and treatment options  - recent blood work reviewed but I did note that she has not had any thyroid  studies and her liver enzymes were slightly elevated. Plan to do some labs and will then formulate a plan.   I counseled the patient regarding the following: Skin care: Recommend moisturizers, anti-histamines and sarna lotion. Expectations: Pruritus can be intermittent or persistent. Persistent cases can be due to dry skin, mite infestations, allergic reactions, neurodermatoses, or  organic disease. For pruritus lasting longer than several months, CBC, TSH, LFTs, BUN/Crt, C-Xray may be warranted.   I spent a total of 45 minutes reviewing the patient's chart, including:   15 minutes reviewing prior records, labs, imaging, and other pertinent information before the visit.   25 minutes discussing the patient's case and providing care during the visit.    20 minutes documenting and reviewing post-visit findings and care plan after the visit.     PRURITUS   Related Procedures Hepatic Function Panel Thyroid  Profile  Return if symptoms worsen or fail to improve.  I, Doyce Pan, CMA, am acting as scribe for Martyna Thorns K, PA-C.   Documentation: I have reviewed the above documentation for accuracy and completeness, and I agree with the above.  Hipolito Martinezlopez K, PA-C

## 2024-05-03 NOTE — Patient Instructions (Signed)

## 2024-05-04 NOTE — Progress Notes (Signed)
 Called and spoke to pt - advised of CT results per Dr. Pleas. Pt verbalized understanding, NFN.

## 2024-06-14 ENCOUNTER — Encounter: Payer: Self-pay | Admitting: Physical Medicine and Rehabilitation

## 2024-06-14 ENCOUNTER — Encounter: Attending: Physical Medicine and Rehabilitation | Admitting: Physical Medicine and Rehabilitation

## 2024-06-14 VITALS — BP 112/75 | HR 75 | Ht 65.0 in | Wt 146.0 lb

## 2024-06-14 DIAGNOSIS — M7918 Myalgia, other site: Secondary | ICD-10-CM | POA: Insufficient documentation

## 2024-06-14 DIAGNOSIS — N319 Neuromuscular dysfunction of bladder, unspecified: Secondary | ICD-10-CM | POA: Diagnosis present

## 2024-06-14 DIAGNOSIS — G8220 Paraplegia, unspecified: Secondary | ICD-10-CM | POA: Diagnosis present

## 2024-06-14 DIAGNOSIS — M7521 Bicipital tendinitis, right shoulder: Secondary | ICD-10-CM | POA: Diagnosis present

## 2024-06-14 MED ORDER — LIDOCAINE HCL 1 % IJ SOLN
3.0000 mL | Freq: Once | INTRAMUSCULAR | Status: AC
  Administered 2024-06-14: 3 mL
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
## 2024-06-14 NOTE — Patient Instructions (Signed)
 Plan:  Will need info on her Sacral pressure ulcer- don't have specs of size, depth or stage- to determine if you need additional assistance with sacral wound.   2.   Needs to do pressure relief every 15-20 minutes-  if talking to someone, every time they move, you move.   3.  We discussed her skin- and sunspots- and sunscreen- need to wear sunscreen when goes out-.  Especially with pale skin.   4. Patient here for trigger point injections for  Consent done and on chart.  Cleaned areas with alcohol  and injected using a 27 gauge 1.5 inch needle  Injected  2 cc- 1cc wasted Using 1% Lidocaine  with no EPI  Upper traps R only Levators Posterior scalenes Middle scalenes Splenius Capitus Pectoralis Major Rhomboids Infraspinatus Teres Major/minor Thoracic paraspinals Lumbar paraspinals Other injections- R deltoid and R triceps      There was no bleeding or complications.  Patient was advised to drink a lot of water on day after injections to flush system Will have increased soreness for 12-48 hours after injections.  Can use Lidocaine  patches the day AFTER injections Can use theracane on day of injections in places didn't inject Can use heating pad 4-6 hours AFTER injections  5. Discussed all her medical issues, constant itching and Derm issues- as well as founier's gangrene.    6. Needs to get another theracane- the muscle hook- that have it at [employer] - I think it's small- get if possible. If it was helpful.    7.  F/U in 6 weeks- f/u for R bicipital tendinitis injection.  Or as soon as possible.  Make multiple appointments. Alternate single and double q 6 weeks.

## 2024-06-19 ENCOUNTER — Ambulatory Visit (INDEPENDENT_AMBULATORY_CARE_PROVIDER_SITE_OTHER)

## 2024-06-19 VITALS — BP 110/72 | HR 72 | Temp 98.2°F | Ht 65.0 in | Wt 146.0 lb

## 2024-06-19 DIAGNOSIS — R053 Chronic cough: Secondary | ICD-10-CM | POA: Diagnosis not present

## 2024-06-19 DIAGNOSIS — J4520 Mild intermittent asthma, uncomplicated: Secondary | ICD-10-CM | POA: Diagnosis not present

## 2024-06-19 DIAGNOSIS — Z9109 Other allergy status, other than to drugs and biological substances: Secondary | ICD-10-CM

## 2024-06-19 DIAGNOSIS — K449 Diaphragmatic hernia without obstruction or gangrene: Secondary | ICD-10-CM | POA: Diagnosis not present

## 2024-06-19 DIAGNOSIS — R7689 Other specified abnormal immunological findings in serum: Secondary | ICD-10-CM

## 2024-06-19 DIAGNOSIS — K2100 Gastro-esophageal reflux disease with esophagitis, without bleeding: Secondary | ICD-10-CM

## 2024-06-19 DIAGNOSIS — J984 Other disorders of lung: Secondary | ICD-10-CM | POA: Diagnosis not present

## 2024-06-19 DIAGNOSIS — R898 Other abnormal findings in specimens from other organs, systems and tissues: Secondary | ICD-10-CM

## 2024-06-19 LAB — PULMONARY FUNCTION TEST
DL/VA % pred: 93 %
DL/VA: 3.82 ml/min/mmHg/L
DLCO unc % pred: 60 %
DLCO unc: 12.3 ml/min/mmHg
FEF 25-75 Post: 2.19 L/s
FEF 25-75 Pre: 1.51 L/s
FEF2575-%Change-Post: 44 %
FEF2575-%Pred-Post: 118 %
FEF2575-%Pred-Pre: 81 %
FEV1-%Change-Post: 9 %
FEV1-%Pred-Post: 68 %
FEV1-%Pred-Pre: 62 %
FEV1-Post: 1.6 L
FEV1-Pre: 1.45 L
FEV1FVC-%Change-Post: -6 %
FEV1FVC-%Pred-Pre: 109 %
FEV6-%Change-Post: 17 %
FEV6-%Pred-Post: 70 %
FEV6-%Pred-Pre: 60 %
FEV6-Post: 2.07 L
FEV6-Pre: 1.76 L
FEV6FVC-%Pred-Post: 104 %
FEV6FVC-%Pred-Pre: 104 %
FVC-%Change-Post: 17 %
FVC-%Pred-Post: 67 %
FVC-%Pred-Pre: 57 %
FVC-Post: 2.07 L
FVC-Pre: 1.76 L
Post FEV1/FVC ratio: 77 %
Post FEV6/FVC ratio: 100 %
Pre FEV1/FVC ratio: 82 %
Pre FEV6/FVC Ratio: 100 %

## 2024-06-19 MED ORDER — AIRSUPRA 90-80 MCG/ACT IN AERO: 2.0000 | INHALATION_SPRAY | Freq: Four times a day (QID) | RESPIRATORY_TRACT | 3 refills | Status: DC | PRN

## 2024-06-19 NOTE — Assessment & Plan Note (Addendum)
 Likely responsible for some reflux disease Orders:   DG Chest 2 View; Future
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
## 2024-06-19 NOTE — Patient Instructions (Addendum)
 It was a pleasure to see you today. Use BREZTRI 2 puff as needed for now till you can get the prescription for airsupra  inhaler picked up from your pharmacy. It is to be used 2 puffs as needed upto 4 times a day during periods of cough Please rinse your mouth after inhaler use.  Chest xray in 6 months prior to next visit

## 2024-06-19 NOTE — Patient Instructions (Signed)
 Full pft w/o pleth performed due to patient not being able to stand from wheelchair.

## 2024-06-19 NOTE — Progress Notes (Signed)
 Full pft w/o pleth performed due to patient not being able to stand from wheelchair.
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
## 2024-07-03 ENCOUNTER — Encounter: Admitting: Physical Medicine and Rehabilitation

## 2024-08-09 ENCOUNTER — Encounter: Payer: Self-pay | Admitting: Physical Medicine and Rehabilitation

## 2024-08-09 ENCOUNTER — Encounter: Attending: Physical Medicine and Rehabilitation | Admitting: Physical Medicine and Rehabilitation

## 2024-08-09 VITALS — BP 92/67 | HR 96

## 2024-08-09 DIAGNOSIS — R252 Cramp and spasm: Secondary | ICD-10-CM | POA: Insufficient documentation

## 2024-08-09 DIAGNOSIS — N319 Neuromuscular dysfunction of bladder, unspecified: Secondary | ICD-10-CM | POA: Insufficient documentation

## 2024-08-09 DIAGNOSIS — M24569 Contracture, unspecified knee: Secondary | ICD-10-CM | POA: Insufficient documentation

## 2024-08-09 DIAGNOSIS — Z993 Dependence on wheelchair: Secondary | ICD-10-CM | POA: Insufficient documentation

## 2024-08-09 DIAGNOSIS — M7918 Myalgia, other site: Secondary | ICD-10-CM | POA: Insufficient documentation

## 2024-08-09 DIAGNOSIS — G8220 Paraplegia, unspecified: Secondary | ICD-10-CM | POA: Diagnosis not present

## 2024-08-09 DIAGNOSIS — K592 Neurogenic bowel, not elsewhere classified: Secondary | ICD-10-CM | POA: Diagnosis not present

## 2024-08-09 MED ORDER — LIDOCAINE HCL 1 % IJ SOLN
3.0000 mL | Freq: Once | INTRAMUSCULAR | Status: AC
  Administered 2024-08-09: 3 mL

## 2024-08-09 NOTE — Patient Instructions (Signed)
 Plan: Cannot do steroid injections- but will do trp injections as ordered- will do steroid inj at next appt.    2.  Has appt with Urology 2/4 for not having catheter work- but is voiding around foley. -    3. Patient here for trigger point injections for  Consent done and on chart.  Cleaned areas with alcohol  and injected using a 27 gauge 1.5 inch needle  Injected  2.5 cc- wasted 0.5 cc Using 1% Lidocaine  with no EPI  Upper traps R only; R supraspinatus only Levators R levators - only R side Posterior scalenes Middle scalenes Splenius Capitus Pectoralis Major Rhomboids Infraspinatus Teres Major/minor Thoracic paraspinals Lumbar paraspinals Other injections-    Patient's level of pain prior was  7/10  Current level of pain after injections is feels a little looser-   There was no bleeding or complications.  Patient was advised to drink a lot of water on day after injections to flush system Will have increased soreness for 12-48 hours after injections.  Can use Lidocaine  patches the day AFTER injections Can use theracane on day of injections in places didn't inject Can use heating pad 4-6 hours AFTER injections   4. Baclofen  dosing is going well- doesn't need changes   5. If B12 level low, and  of note, she is tired- then I would give B12 shots monthly- please test pt's level.    6.  Con't Oxycodone  as needed for pain- per SNF.   7.  Seeing Gyn for hot flashes; and GI for excessive amount of gas- but having large amount of gas, is common for SCI patient-s but sounds as if new, so needs to have it checked out.    8. F/U- 6 weeks-  Next appt shoulder injections- steroid- R shoulder- and trp Injections - and f/u on SCI- altenrate single and double appts
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
## 2024-08-10 ENCOUNTER — Emergency Department (HOSPITAL_COMMUNITY)
Admission: EM | Admit: 2024-08-10 | Discharge: 2024-08-10 | Disposition: A | Discharge status: Home / Self Care | Attending: Emergency Medicine | Admitting: Emergency Medicine

## 2024-08-10 ENCOUNTER — Encounter (HOSPITAL_COMMUNITY): Payer: Self-pay

## 2024-08-10 ENCOUNTER — Other Ambulatory Visit: Payer: Self-pay

## 2024-08-10 DIAGNOSIS — T839XXA Unspecified complication of genitourinary prosthetic device, implant and graft, initial encounter: Secondary | ICD-10-CM

## 2024-08-10 DIAGNOSIS — R339 Retention of urine, unspecified: Secondary | ICD-10-CM

## 2024-08-10 DIAGNOSIS — Y732 Prosthetic and other implants, materials and accessory gastroenterology and urology devices associated with adverse incidents: Secondary | ICD-10-CM | POA: Diagnosis not present

## 2024-08-10 DIAGNOSIS — T83091A Other mechanical complication of indwelling urethral catheter, initial encounter: Secondary | ICD-10-CM | POA: Diagnosis not present

## 2024-08-10 LAB — CBC
HCT: 41.6 % (ref 36.0–46.0)
Hemoglobin: 13 g/dL (ref 12.0–15.0)
MCH: 23.4 pg — ABNORMAL LOW (ref 26.0–34.0)
MCHC: 31.3 g/dL (ref 30.0–36.0)
MCV: 75 fL — ABNORMAL LOW (ref 80.0–100.0)
Platelets: 260 K/uL (ref 150–400)
RBC: 5.55 MIL/uL — ABNORMAL HIGH (ref 3.87–5.11)
RDW: 15.3 % (ref 11.5–15.5)
WBC: 7.5 K/uL (ref 4.0–10.5)
nRBC: 0 % (ref 0.0–0.2)

## 2024-08-10 LAB — COMPREHENSIVE METABOLIC PANEL WITH GFR
ALT: 23 U/L (ref 0–44)
AST: 21 U/L (ref 15–41)
Albumin: 3.7 g/dL (ref 3.5–5.0)
Alkaline Phosphatase: 96 U/L (ref 38–126)
Anion gap: 11 (ref 5–15)
BUN: 17 mg/dL (ref 8–23)
CO2: 24 mmol/L (ref 22–32)
Calcium: 10 mg/dL (ref 8.9–10.3)
Chloride: 101 mmol/L (ref 98–111)
Creatinine, Ser: 0.6 mg/dL (ref 0.44–1.00)
GFR, Estimated: >60 mL/min
Glucose, Bld: 98 mg/dL (ref 70–99)
Potassium: 3.8 mmol/L (ref 3.5–5.1)
Sodium: 136 mmol/L (ref 135–145)
Total Bilirubin: 0.5 mg/dL (ref 0.0–1.2)
Total Protein: 8.1 g/dL (ref 6.5–8.1)

## 2024-08-10 MED ORDER — SIMETHICONE 125 MG PO TABS: 125.0000 mg | ORAL_TABLET | Freq: Three times a day (TID) | ORAL | 0 refills | Status: DC | PRN

## 2024-08-10 MED ORDER — ALUM & MAG HYDROXIDE-SIMETH 200-200-20 MG/5ML PO SUSP
15.0000 mL | Freq: Once | ORAL | Status: AC
  Administered 2024-08-10: 15 mL via ORAL
  Filled 2024-08-10: qty 30
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
## 2024-08-10 NOTE — ED Triage Notes (Signed)
 Pt BIB EMS from [facility] facility, pt has chronic catheter and staff noticed it has not been putting out much urine over the past few days. Staff replaced catheter today and did not get any urinary output, so concerned about urinary retention. Pt is bed bound with chronic extremity fractures at baseline. AOX4, no complaints at this time.

## 2024-08-10 NOTE — Discharge Instructions (Signed)
 Thank you for seeing us  today.  You came in today due to concerns for no drainage in her catheter.  Your catheter appeared to be malpositioned and we were able to replace that here that resulted in drainage of urine.  Please return to the emergency department if you have worsening pain, fevers, chills or again issues with catheter drainage.

## 2024-08-10 NOTE — ED Notes (Signed)
 [facility] called [phone], report given to Altamese Carls, LPN. [facility] called by diplomatic services operational officer.
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
## 2024-08-22 ENCOUNTER — Ambulatory Visit: Admitting: Physician Assistant

## 2024-08-25 ENCOUNTER — Telehealth: Payer: Self-pay | Admitting: *Deleted

## 2024-08-25 NOTE — Telephone Encounter (Addendum)
 Spoke w/representative a [employer] and provided phone # to call and schedule her CT scan. Facility needs to schedule due to transportation. CT scheduled for 2/4 at 0900 arrival for 1100 scan. She will come to [facility] for her lab at 0945. This was confirmed w/facility. Scheduling message sent for scan review appointment week of 2/09

## 2024-08-28 ENCOUNTER — Ambulatory Visit: Admitting: Physician Assistant

## 2024-08-30 ENCOUNTER — Inpatient Hospital Stay

## 2024-08-30 ENCOUNTER — Ambulatory Visit (HOSPITAL_COMMUNITY)

## 2024-09-07 ENCOUNTER — Ambulatory Visit (HOSPITAL_COMMUNITY)

## 2024-09-07 ENCOUNTER — Inpatient Hospital Stay

## 2024-09-20 ENCOUNTER — Encounter: Admitting: Physical Medicine and Rehabilitation

## 2024-09-22 ENCOUNTER — Ambulatory Visit: Admitting: Physician Assistant

## 2024-11-01 ENCOUNTER — Encounter: Admitting: Physical Medicine and Rehabilitation

## 2024-11-22 ENCOUNTER — Encounter: Admitting: Physical Medicine and Rehabilitation

## 2024-12-13 ENCOUNTER — Encounter: Admitting: Physical Medicine and Rehabilitation
